# Patient Record
Sex: Female | Born: 1945 | Race: White | Hispanic: No | Smoking: Never smoker
Health system: Southern US, Community
[De-identification: ages and names within clinical notes are randomized; demographics above are authoritative.]

## PROBLEM LIST (undated history)

## (undated) DIAGNOSIS — Z8719 Personal history of other diseases of the digestive system: Secondary | ICD-10-CM

## (undated) DIAGNOSIS — E079 Disorder of thyroid, unspecified: Secondary | ICD-10-CM

## (undated) DIAGNOSIS — J189 Pneumonia, unspecified organism: Secondary | ICD-10-CM

## (undated) DIAGNOSIS — K219 Gastro-esophageal reflux disease without esophagitis: Secondary | ICD-10-CM

## (undated) DIAGNOSIS — M48061 Spinal stenosis, lumbar region without neurogenic claudication: Secondary | ICD-10-CM

## (undated) DIAGNOSIS — K315 Obstruction of duodenum: Secondary | ICD-10-CM

## (undated) DIAGNOSIS — R0601 Orthopnea: Secondary | ICD-10-CM

## (undated) DIAGNOSIS — E039 Hypothyroidism, unspecified: Secondary | ICD-10-CM

## (undated) DIAGNOSIS — M549 Dorsalgia, unspecified: Secondary | ICD-10-CM

## (undated) DIAGNOSIS — D369 Benign neoplasm, unspecified site: Secondary | ICD-10-CM

## (undated) DIAGNOSIS — F32A Depression, unspecified: Secondary | ICD-10-CM

## (undated) DIAGNOSIS — K224 Dyskinesia of esophagus: Secondary | ICD-10-CM

## (undated) DIAGNOSIS — M1990 Unspecified osteoarthritis, unspecified site: Secondary | ICD-10-CM

## (undated) DIAGNOSIS — H353290 Exudative age-related macular degeneration, unspecified eye, stage unspecified: Secondary | ICD-10-CM

## (undated) DIAGNOSIS — Z87898 Personal history of other specified conditions: Secondary | ICD-10-CM

## (undated) DIAGNOSIS — M797 Fibromyalgia: Secondary | ICD-10-CM

## (undated) DIAGNOSIS — H9190 Unspecified hearing loss, unspecified ear: Secondary | ICD-10-CM

## (undated) DIAGNOSIS — I10 Essential (primary) hypertension: Secondary | ICD-10-CM

## (undated) DIAGNOSIS — J45909 Unspecified asthma, uncomplicated: Secondary | ICD-10-CM

## (undated) DIAGNOSIS — H409 Unspecified glaucoma: Secondary | ICD-10-CM

## (undated) DIAGNOSIS — R0600 Dyspnea, unspecified: Secondary | ICD-10-CM

## (undated) DIAGNOSIS — G894 Chronic pain syndrome: Secondary | ICD-10-CM

## (undated) DIAGNOSIS — F319 Bipolar disorder, unspecified: Secondary | ICD-10-CM

## (undated) DIAGNOSIS — F329 Major depressive disorder, single episode, unspecified: Secondary | ICD-10-CM

## (undated) DIAGNOSIS — H3530 Unspecified macular degeneration: Secondary | ICD-10-CM

## (undated) HISTORY — PX: COCCYX REMOVAL: SHX600

## (undated) HISTORY — DX: Benign neoplasm, unspecified site: D36.9

## (undated) HISTORY — DX: Unspecified glaucoma: H40.9

## (undated) HISTORY — PX: HERNIA REPAIR: SHX51

## (undated) HISTORY — DX: Dyskinesia of esophagus: K22.4

## (undated) HISTORY — PX: TONSILLECTOMY: SUR1361

## (undated) HISTORY — PX: HEMORROIDECTOMY: SUR656

## (undated) HISTORY — DX: Unspecified macular degeneration: H35.30

## (undated) HISTORY — PX: FOOT SURGERY: SHX648

---

## 1995-02-03 HISTORY — PX: ERCP: SHX60

## 2000-06-16 ENCOUNTER — Encounter: Payer: Self-pay | Admitting: Occupational Therapy

## 2000-06-16 ENCOUNTER — Ambulatory Visit (HOSPITAL_COMMUNITY): Admission: RE | Admit: 2000-06-16 | Discharge: 2000-06-16 | Payer: Self-pay | Admitting: Pulmonary Disease

## 2000-08-31 ENCOUNTER — Ambulatory Visit (HOSPITAL_COMMUNITY): Admission: RE | Admit: 2000-08-31 | Discharge: 2000-08-31 | Payer: Self-pay | Admitting: Occupational Therapy

## 2000-08-31 ENCOUNTER — Encounter: Payer: Self-pay | Admitting: Occupational Therapy

## 2001-06-20 ENCOUNTER — Other Ambulatory Visit: Admission: RE | Admit: 2001-06-20 | Discharge: 2001-06-20 | Payer: Self-pay | Admitting: Family Medicine

## 2001-07-25 ENCOUNTER — Ambulatory Visit (HOSPITAL_COMMUNITY): Admission: RE | Admit: 2001-07-25 | Discharge: 2001-07-25 | Payer: Self-pay | Admitting: Family Medicine

## 2001-07-25 ENCOUNTER — Encounter: Payer: Self-pay | Admitting: Family Medicine

## 2001-12-02 ENCOUNTER — Ambulatory Visit (HOSPITAL_BASED_OUTPATIENT_CLINIC_OR_DEPARTMENT_OTHER): Admission: RE | Admit: 2001-12-02 | Discharge: 2001-12-02 | Payer: Self-pay | Admitting: Podiatry

## 2001-12-23 ENCOUNTER — Ambulatory Visit (HOSPITAL_BASED_OUTPATIENT_CLINIC_OR_DEPARTMENT_OTHER): Admission: RE | Admit: 2001-12-23 | Discharge: 2001-12-23 | Payer: Self-pay | Admitting: Podiatry

## 2002-08-23 ENCOUNTER — Encounter: Payer: Self-pay | Admitting: Emergency Medicine

## 2002-08-23 ENCOUNTER — Emergency Department (HOSPITAL_COMMUNITY): Admission: EM | Admit: 2002-08-23 | Discharge: 2002-08-23 | Payer: Self-pay | Admitting: Emergency Medicine

## 2002-11-06 ENCOUNTER — Ambulatory Visit (HOSPITAL_COMMUNITY): Admission: RE | Admit: 2002-11-06 | Discharge: 2002-11-06 | Payer: Self-pay | Admitting: Family Medicine

## 2002-11-06 ENCOUNTER — Encounter: Payer: Self-pay | Admitting: Family Medicine

## 2003-02-03 HISTORY — PX: OTHER SURGICAL HISTORY: SHX169

## 2003-06-26 ENCOUNTER — Other Ambulatory Visit: Admission: RE | Admit: 2003-06-26 | Discharge: 2003-06-26 | Payer: Self-pay | Admitting: Family Medicine

## 2003-08-21 ENCOUNTER — Ambulatory Visit (HOSPITAL_COMMUNITY): Admission: RE | Admit: 2003-08-21 | Discharge: 2003-08-21 | Payer: Self-pay | Admitting: Internal Medicine

## 2003-12-22 ENCOUNTER — Emergency Department (HOSPITAL_COMMUNITY): Admission: EM | Admit: 2003-12-22 | Discharge: 2003-12-22 | Payer: Self-pay | Admitting: Emergency Medicine

## 2003-12-28 ENCOUNTER — Emergency Department (HOSPITAL_COMMUNITY): Admission: EM | Admit: 2003-12-28 | Discharge: 2003-12-28 | Payer: Self-pay | Admitting: Emergency Medicine

## 2004-01-10 ENCOUNTER — Ambulatory Visit (HOSPITAL_COMMUNITY): Admission: RE | Admit: 2004-01-10 | Discharge: 2004-01-10 | Payer: Self-pay | Admitting: Family Medicine

## 2004-01-15 ENCOUNTER — Ambulatory Visit: Payer: Self-pay | Admitting: Internal Medicine

## 2004-02-06 ENCOUNTER — Ambulatory Visit (HOSPITAL_COMMUNITY): Admission: RE | Admit: 2004-02-06 | Discharge: 2004-02-06 | Payer: Self-pay | Admitting: Family Medicine

## 2004-05-13 ENCOUNTER — Inpatient Hospital Stay: Payer: Self-pay | Admitting: Psychiatry

## 2004-06-11 ENCOUNTER — Ambulatory Visit: Payer: Self-pay | Admitting: Internal Medicine

## 2004-12-03 ENCOUNTER — Ambulatory Visit: Payer: Self-pay | Admitting: Internal Medicine

## 2004-12-18 ENCOUNTER — Other Ambulatory Visit: Admission: RE | Admit: 2004-12-18 | Discharge: 2004-12-18 | Payer: Self-pay | Admitting: Family Medicine

## 2005-03-17 ENCOUNTER — Ambulatory Visit (HOSPITAL_COMMUNITY): Admission: RE | Admit: 2005-03-17 | Discharge: 2005-03-17 | Payer: Self-pay | Admitting: Internal Medicine

## 2006-01-13 ENCOUNTER — Ambulatory Visit: Payer: Self-pay | Admitting: Internal Medicine

## 2006-06-11 ENCOUNTER — Ambulatory Visit (HOSPITAL_COMMUNITY): Admission: RE | Admit: 2006-06-11 | Discharge: 2006-06-11 | Payer: Self-pay | Admitting: Family Medicine

## 2006-07-05 ENCOUNTER — Other Ambulatory Visit: Payer: Self-pay

## 2006-07-06 ENCOUNTER — Inpatient Hospital Stay: Payer: Self-pay | Admitting: Internal Medicine

## 2006-07-10 ENCOUNTER — Emergency Department: Payer: Self-pay | Admitting: Emergency Medicine

## 2006-07-19 ENCOUNTER — Inpatient Hospital Stay: Payer: Self-pay | Admitting: Unknown Physician Specialty

## 2006-07-21 ENCOUNTER — Other Ambulatory Visit: Payer: Self-pay

## 2007-04-11 ENCOUNTER — Other Ambulatory Visit: Admission: RE | Admit: 2007-04-11 | Discharge: 2007-04-11 | Payer: Self-pay | Admitting: Family Medicine

## 2007-07-28 ENCOUNTER — Ambulatory Visit (HOSPITAL_COMMUNITY): Admission: RE | Admit: 2007-07-28 | Discharge: 2007-07-28 | Payer: Self-pay | Admitting: Family Medicine

## 2007-08-16 ENCOUNTER — Emergency Department (HOSPITAL_COMMUNITY): Admission: EM | Admit: 2007-08-16 | Discharge: 2007-08-16 | Payer: Self-pay | Admitting: Emergency Medicine

## 2008-01-24 ENCOUNTER — Inpatient Hospital Stay: Payer: Self-pay | Admitting: Psychiatry

## 2008-01-24 ENCOUNTER — Ambulatory Visit: Payer: Self-pay | Admitting: Internal Medicine

## 2008-03-26 ENCOUNTER — Inpatient Hospital Stay: Payer: Self-pay | Admitting: Psychiatry

## 2008-03-29 ENCOUNTER — Ambulatory Visit: Payer: Self-pay | Admitting: Internal Medicine

## 2008-10-02 ENCOUNTER — Ambulatory Visit (HOSPITAL_COMMUNITY): Admission: RE | Admit: 2008-10-02 | Discharge: 2008-10-02 | Payer: Self-pay | Admitting: Family Medicine

## 2009-01-07 ENCOUNTER — Inpatient Hospital Stay: Payer: Self-pay | Admitting: Internal Medicine

## 2009-12-18 ENCOUNTER — Ambulatory Visit (HOSPITAL_COMMUNITY): Admission: RE | Admit: 2009-12-18 | Discharge: 2009-12-18 | Payer: Self-pay | Admitting: Family Medicine

## 2010-01-13 ENCOUNTER — Ambulatory Visit (HOSPITAL_COMMUNITY)
Admission: RE | Admit: 2010-01-13 | Discharge: 2010-01-13 | Payer: Self-pay | Source: Home / Self Care | Attending: Family Medicine | Admitting: Family Medicine

## 2010-02-24 ENCOUNTER — Encounter: Payer: Self-pay | Admitting: Family Medicine

## 2010-06-20 NOTE — Op Note (Signed)
   NAME:  Heidi Fuentes, Heidi Fuentes                        ACCOUNT NO.:  [id number]   MEDICAL RECORD NO.:  [id number]                   PATIENT TYPE:  AMB   LOCATION:  DSC                                  FACILITY:  [facility]   PHYSICIAN:  Ezequiel Kayser. Ajlouny, D.P.M.           DATE OF BIRTH:  1945/08/02   DATE OF PROCEDURE:  12/02/2001  DATE OF DISCHARGE:  12/02/2001                                 OPERATIVE REPORT   ADDENDUM:  Periosteum were closed with 3-0 Vicryl, subcu closed with 4-0  Vicryl and skin closure with 4-0 Vicryl mad in horizontal mattress fashion.  Postoperatively, 5 cc of 0.5% Marcaine plain and 1 cc of dexamethasone  phosphate were infiltrated round the surgical site.  The foot was dressed  with Xeroform, 4 x 4's, Kling, and Coban.  The tourniquet was deflated and  vascular status returned to all digits.  The patient was sent to the  recovery room with vital signs stable and capillary refill at good levels.  Both written and oral postoperative instructions were given to the patient.  Postoperative shoe was dispensed.  The patient was instructed to contact  office immediately if there are any problems.   DESCRIPTION OF PROCEDURE:  The blank should read ankle.   Second paragraph, where it says the capital fragment was transposed  laterally and plantar flexed the desired amount and impacted onto the shaft.  This was temporarily stabilized with a 0.062 K-wire.  Next, two points of  fixation were used, using a 1.5 mm Bionx absorbable pin in the usual AO  fashion.  The K-wire fixation was removed and the osteotomy site was found  to be stable and in good alignment.  (Delete in very good position.)  The  remaining medial eminence was resected using a saggital saw and all rough  edges were smoothed with a rotary bur.                                               Ezequiel Kayser. Harriet Pho, D.P.M.    MJA/MEDQ  D:  12/23/2001  T:  12/24/2001  Job:  [id number]

## 2010-06-20 NOTE — Op Note (Signed)
NAME:  Heidi Fuentes, Heidi Fuentes                        ACCOUNT NO.:  [id number]   MEDICAL RECORD NO.:  [id number]                   PATIENT TYPE:  AMB   LOCATION:  DAY                                  FACILITY:  [facility]   PHYSICIAN:  Lionel December, M.D.                 DATE OF BIRTH:  1945/08/05   DATE OF PROCEDURE:  08/21/2003  DATE OF DISCHARGE:                                 OPERATIVE REPORT   PROCEDURE:  Total colonoscopy.   INDICATIONS FOR PROCEDURE:  Ms. Heidi Fuentes is a 65 year old Caucasian female  with multiple medical problems who presents with intractable constipation.  She has had constipation for several years, but lately it has gotten worse  and nothing works other than OTC laxatives.  She had a flexible  sigmoidoscopy 25 years ago.  She is undergoing colonoscopy both for  diagnostic/screening purposes.  She has intermittent hematochezia felt to be  secondary to hemorrhoids.  The procedure and risks were reviewed with the  patient, and informed consent was obtained.   PREOPERATIVE MEDICATIONS:  Demerol 50 mg IV, Versed 12 mg IV in divided  dose.   FINDINGS:  The procedure was performed in the endoscopy suite.  The  patient's vital signs and O2 saturations were monitored during the procedure  and remained stable.  The patient was placed in the left lateral recumbent  position and rectal examination performed.  No abnormality noted on external  or digital exam.  Rectal tone was felt to be normal.  The Olympus videoscope  was placed into the rectum and advanced into the region of the sigmoid  colon.  There was mild pigmentation of the mucosa of the sigmoid colon  consistent with melanosis coli.  Tortuous sigmoid colon, but slowly the  scope was advanced into the splenic flexure and beyond.  The scope was  passed to the cecum which was identified by the appendiceal orifice and  ileocecal valve.  Pictures were taken for the record.  As the scope was  withdrawn, the colonic mucosa was  carefully examined and was otherwise  normal throughout.  The rectal mucosa similarly was normal.  The scope was  retroflexed to examine the anorectal junction, and there was a small scar  just above the dentate line felt to be from previous hemorrhoidectomy.  The  endoscope was straightened and withdrawn.  The patient tolerated the  procedure well.   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  Otherwise normal colonoscopy.   RECOMMENDATIONS:  1. She will continue high fiber diet and Fiber Choice as before.  She will     also stay on Colace two tablets at bedtime.  2. I would like for her to go back on MiraLax one to one and a half scoops     daily.  3. The patient is advised to try to have a bowel movement 15 to 30 minutes  after breakfast daily even though she does not have an urge.  She could     use a glycerine or Dulcolax suppository to facilitate this.  If she has     no bowel movement, she is allowed to use a Fleet enema every third day.     The patient is advised not to take any OTC     laxatives.  4. She will keep a stool diary and return for office visit in eight weeks.     Unless there is significant improvement, would try her on Zelnorm prior     to considering colonic motility study.      ___________________________________________                                            Lionel December, M.D.   NR/MEDQ  D:  08/21/2003  T:  08/21/2003  Job:  [id number]   cc:   Molly Maduro L. Foy Guadalajara, M.D.  [address] [place]  [place] [address]  Fax: [phone]
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
## 2010-06-20 NOTE — Op Note (Signed)
NAME:  Heidi Fuentes, Heidi Fuentes                        ACCOUNT NO.:  [id number]   MEDICAL RECORD NO.:  [id number]                   PATIENT TYPE:  AMB   LOCATION:  DSC                                  FACILITY:  [facility]   PHYSICIAN:  Ezequiel Kayser. Ajlouny, D.P.M.           DATE OF BIRTH:  16-Mar-1945   DATE OF PROCEDURE:  DATE OF DISCHARGE:                                 OPERATIVE REPORT   PREOPERATIVE DIAGNOSIS:  Tailor's bunion, left foot.   POSTOPERATIVE DIAGNOSIS:  Tailor's bunion, left foot.   PROCEDURE:  Tailor's bunionectomy with fifth metatarsal osteotomy and  internal fixation, left foot.   ASSISTANT:  None.   HEMOSTASIS:  Pneumatic ankle tourniquet inflated to 250 mmHg.   ANESTHESIA:  Monitored local.   DESCRIPTION OF PROCEDURE:  The patient was brought to the operating room and  placed in the supine position at which time monitored anesthesia care was  administered.  A local block was performed with a 1:1 mixture of 0.5%  Marcaine plain and 2% lidocaine plain.  A well padded pneumatic ankle  tourniquet was applied superior to the medial malleolus.  The patient was  prepped and draped in the usual aseptic manner.  The foot was exsanguinated  and a Esmarch bandage and the previously placed tourniquet inflated to 250  mmHg.   Attention was directed to the fifth ray where a dorsal linear incision was  made.  The incision was deepened via blunt and sharp modalities, taking care  to cauterize all bleeding vessels and ensuring retraction of all  neurovascular structures encountered.  The deep and superficial fascia were  separated dorsally and laterally.  The incision was deepened, and an L-  shaped capsulotomy was made.  Capsule and periosteum were made reflecting  the periosteum and capsule off the fifth metatarsal head.  The lateral  eminence ws resected parallel to the shaft utilizing the sagittal saw.  Next, a V-oriented osteotomy was made transposing the capital fragment  medially, the desired amount impacted onto the shaft.  This was made to  attempt to lengthen the metatarsal as well.  This was temporarily stabilized  with a 0.062 K-wire.  Using one point of fixation, a 1.5 Bionx Smart Pin  absorbable pin was used to stabilize the osteotomy site.  The K-wire was  then removed.  This was found to be stable and in good alignment.  The  remaining lateral eminence was resected and all rough edges were smoothed.  A lateral capsulorrhaphy was performed.  Capsule and periosteum were closed  with 3-0 Vicryl, subcutaneous closure with 4-0 Vicryl and skin closure with  4-0 nylon in a horizontal mattress fashion.  Postoperatively, 2 cc of 0.5  Marcaine plain and 1 cc of dexamethasone were infiltrated along the surgical  site.  The foot was dressed with Xeroform, 4 x 4, Kling and Coban.  The  tourniquet was deflated and vascular status returned to all digits.  The  patient was sent to the recovery room with vital signs stable and capillary  refill time at pre-surgical levels.  Both written and oral postoperative  instructions were give to the patient.  Postoperative shoe was dispensed.  The patient was instructed to contact the office immediately if there are  any problems.                                               Ezequiel Kayser. Harriet Pho, D.P.M.   MJA/MEDQ  D:  12/23/2001  T:  12/24/2001  Job:  [id number]

## 2010-06-20 NOTE — Op Note (Signed)
NAME:  Heidi Fuentes, Heidi Fuentes                        ACCOUNT NO.:  [id number]   MEDICAL RECORD NO.:  [id number]                   PATIENT TYPE:  AMB   LOCATION:  DSC                                  FACILITY:  [facility]   PHYSICIAN:  Ezequiel Kayser. Ajlouny, D.P.M.           DATE OF BIRTH:  Nov 08, 1945   DATE OF PROCEDURE:  12/02/2001  DATE OF DISCHARGE:  12/02/2001                                 OPERATIVE REPORT   PREOPERATIVE DIAGNOSES:  1. Hallux abductovalgus deformity, right foot.  2. Ladona Ridgel bunion deformity, right foot.   POSTOPERATIVE DIAGNOSES:  1. Hallux abductovalgus deformity, right foot.  2. Ladona Ridgel bunion deformity, right foot.   PROCEDURES:  1. Bunionectomy with first metatarsal osteotomy and internal fixation, right     foot.  2. Ladona Ridgel bunionectomy with fifth metatarsal osteotomy and internal     fixation, right foot.   SURGEON:  Ezequiel Kayser. Ajlouny, D.P.M.   HEMOSTASIS:  Pneumatic ankle tourniquet inflated to 250 mmHg.   ANESTHESIA:  MAC with local.   DESCRIPTION OF PROCEDURE:  The patient was brought to the OR and placed in  the supine position, at which time monitored anesthesia care was  administered.  A local block was performed with a 1:1 mixture of 0.5%  Marcaine plain and 1% lidocaine plain.  A well-padded pneumatic _________  tourniquet was applied superior to the medial malleolus.  The patient was  prepped and draped in the usual aseptic manner.  The foot was exsanguinated  with an Esmarch bandage and the previously-applied tourniquet applied to 250  mmHg.   Attention was directed to the first ray, where a dorsal linear incision was  made.  The incision was deepened using sharp and blunt modalities, taking  care to clamp and cauterize all bleeding vessels, and ensuring retraction of  all neurovascular structures encountered.  The deep and superficial fascia  were separated medially and dorsally the length of the incision.  The  incision was deepened and attention  drawn to the first interspace to the  level of the deep transverse intermetatarsal ligament, which was released at  this time.  Next a lateral capsulotomy was made and the adductor tendon  released from the base of the proximal phalanx and the fibular sesamoid  ligament released, allowing for medial transposition of the sesamoid.  Attention was redirected to the head of the first metatarsal, where an  inverted L capsulotomy was made and the capsule and periosteum freed from  the head of the first metatarsal.  Next the medial eminence was resected  utilizing sagittal saw parallel with the shaft.  The head of the first  metatarsal and the base of the proximal phalanx cartilage was evaluated and  found to be within normal limits.  Next the head of the first metatarsal, a  V-oriented osteotomy was made, taking care to ensure that the plantar cut  did not disrupt the plantar sesamoid apparatus.  The capital  fragment was  transposed laterally and plantar flexed the desired amount and impacted on  the shaft.  We temporarily stabilized with a 0.062 K-wire.  Next two points  of fixation were used, using a 1.5 mm Bion-X absorbable pin in the usual AO  fashion.  The temporary K-wire fixation was removed and the ostomy site was  found to be stable in alignment, in very good position.  The remaining  medial eminence was resected utilizing sagittal saw, and all the heads were  smoothed with a rotary bur.  The surgical wound was irrigated with copious  amounts of sterile saline and antibiotic solution.  A medial capsulorrhaphy  was performed.  Deep closure was accomplished using 2-0 and 3-0 Vicryl,  closing the periosteum and capsule.  Subcutaneous closure was accomplished  using 4-0 Vicryl and skin closure was accomplished using 4-0 nylon in a  horizontal mattress fashion.  Next attention was directed to the fifth ray,  where a dorsal linear incision was made.  The incision was deepened via  sharp and  blunt modalities, taking care to clamp and cauterize all bleeding  vessels and ensuring retraction of all neurovascular structures encountered.  The deep and superficial fascia were separated dorsally and laterally.  Incision was deepened and a linear incision through the capsule and  periosteum was made reflecting the periosteum and capsule off the fifth  metatarsal head.  The lateral eminence was resected parallel to the shaft  utilizing sagittal saw, and a V-oriented osteotomy was made, transposing the  capital fragment medially the desired amount and impacted on the shaft.  This was temporarily stabilized with a 0.062 K-wire.  Using one point of  fixation, a 1.5 Bion-X absorbable pin was used to stabilize the osteotomy  site and the K-wire was then removed.  This was found to be stable and in  good alignment.  The remaining lateral eminence was resected and all rough  edges were smoothed.  The capsule and periosteum were closed with 3-0  Vicryl, subcutaneous closure with 4-0 Vicryl, and skin closure with 4-0  nylon in a horizontal mattress fashion.  Postoperatively 5 cc of Sensorcaine  0.5% plain and 1 cc of dexamethasone phosphate were infiltrated between both  surgical sites.  The foot was dressed with Steri-Strips, Xeroform, 4 x 4's,  Kling, and Coban.  The tourniquet deflated and vascular status returned to  all digits.  The patient was sent to the recovery room with vital signs  stable and capillary refill returned to presurgical levels.  Both oral and  written postoperative instructions were given the patient.  A postoperative  shoe was dispensed.  All questions answered.  No guarantees were given.                                               Ezequiel Kayser. Harriet Pho, D.P.M.    MJA/MEDQ  D:  12/04/2001  T:  12/05/2001  Job:  [id number]

## 2010-08-26 ENCOUNTER — Emergency Department (HOSPITAL_COMMUNITY)
Admission: EM | Admit: 2010-08-26 | Discharge: 2010-08-26 | Disposition: A | Discharge status: Home / Self Care | Payer: MEDICARE | Attending: Emergency Medicine | Admitting: Emergency Medicine

## 2010-08-26 ENCOUNTER — Emergency Department (HOSPITAL_COMMUNITY): Payer: MEDICARE

## 2010-08-26 DIAGNOSIS — X19XXXA Contact with other heat and hot substances, initial encounter: Secondary | ICD-10-CM | POA: Insufficient documentation

## 2010-08-26 DIAGNOSIS — S239XXA Sprain of unspecified parts of thorax, initial encounter: Secondary | ICD-10-CM | POA: Insufficient documentation

## 2010-08-26 DIAGNOSIS — X58XXXA Exposure to other specified factors, initial encounter: Secondary | ICD-10-CM | POA: Insufficient documentation

## 2010-08-26 DIAGNOSIS — IMO0001 Reserved for inherently not codable concepts without codable children: Secondary | ICD-10-CM | POA: Insufficient documentation

## 2010-08-26 DIAGNOSIS — T2114XA Burn of first degree of lower back, initial encounter: Secondary | ICD-10-CM | POA: Insufficient documentation

## 2010-08-26 DIAGNOSIS — E079 Disorder of thyroid, unspecified: Secondary | ICD-10-CM | POA: Insufficient documentation

## 2010-08-26 DIAGNOSIS — IMO0002 Reserved for concepts with insufficient information to code with codable children: Secondary | ICD-10-CM

## 2010-08-26 DIAGNOSIS — S335XXA Sprain of ligaments of lumbar spine, initial encounter: Secondary | ICD-10-CM | POA: Insufficient documentation

## 2010-08-26 DIAGNOSIS — Y92009 Unspecified place in unspecified non-institutional (private) residence as the place of occurrence of the external cause: Secondary | ICD-10-CM | POA: Insufficient documentation

## 2010-08-26 HISTORY — DX: Fibromyalgia: M79.7

## 2010-08-26 HISTORY — DX: Disorder of thyroid, unspecified: E07.9

## 2010-08-26 HISTORY — DX: Depression, unspecified: F32.A

## 2010-08-26 HISTORY — DX: Major depressive disorder, single episode, unspecified: F32.9

## 2010-08-26 MED ORDER — LORAZEPAM 1 MG PO TABS
1.0000 mg | ORAL_TABLET | Freq: Once | ORAL | Status: AC
  Administered 2010-08-26: 1 mg via ORAL
  Filled 2010-08-26: qty 1

## 2010-08-26 MED ORDER — MORPHINE SULFATE 10 MG/ML IJ SOLN
4.0000 mg | Freq: Once | INTRAMUSCULAR | Status: AC
  Administered 2010-08-26: 4 mg via INTRAMUSCULAR
  Filled 2010-08-26: qty 1

## 2010-08-26 MED ORDER — KETOROLAC TROMETHAMINE 60 MG/2ML IM SOLN
60.0000 mg | Freq: Once | INTRAMUSCULAR | Status: AC
  Administered 2010-08-26: 60 mg via INTRAMUSCULAR
  Filled 2010-08-26: qty 2

## 2010-08-26 MED ORDER — OXYCODONE-ACETAMINOPHEN 5-325 MG PO TABS: 1.0000 | ORAL_TABLET | ORAL | Status: AC | PRN

## 2010-08-26 MED ORDER — METHOCARBAMOL 500 MG PO TABS: 500.0000 mg | ORAL_TABLET | Freq: Three times a day (TID) | ORAL | Status: AC

## 2010-08-26 MED ORDER — ONDANSETRON 8 MG PO TBDP
8.0000 mg | ORAL_TABLET | Freq: Once | ORAL | Status: AC
  Administered 2010-08-26: 8 mg via ORAL
  Filled 2010-08-26 (×2): qty 1
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
## 2010-08-26 NOTE — ED Notes (Signed)
Pt states morphine did not help much. Ice pack given.

## 2010-08-26 NOTE — ED Notes (Signed)
Pt reports spasms in lower back.  Says worked in her garden Sat and pain started Sun evening.  Has been taking iburofen and tylenol.

## 2010-08-26 NOTE — ED Provider Notes (Signed)
History     Chief Complaint  Patient presents with  . Back Pain   HPI Comments: patient c/o persistent pain and spasms to her lower nad mid back for several days.  States the pain began after she was working in her garden chopping weeds.  States the pain has progressed and feels "tight" in her back and worse with movements and twisting.  States she had been using a heating pad and also c/o burn to the skin of her lower back.  She denies incontinence of urine or feces, numbness, weakness, chest pain of shortness ofbreath  Patient is a 65 y.o. female presenting with back pain. The history is provided by the patient.  Back Pain  This is a new problem. The current episode started more than 2 days ago. The problem occurs constantly. The problem has not changed since onset.The pain is associated with twisting (bending over). The pain is present in the lumbar spine and thoracic spine. The quality of the pain is described as aching and stabbing. The pain does not radiate. The pain is moderate. The symptoms are aggravated by bending, twisting and certain positions. The pain is the same all the time. Associated symptoms include paresis. Pertinent negatives include no chest pain, no fever, no numbness, no headaches, no abdominal pain, no bowel incontinence, no perianal numbness, no bladder incontinence, no dysuria, no pelvic pain, no leg pain, no paresthesias, no tingling and no weakness. She has tried heat and NSAIDs for the symptoms. The treatment provided no relief.    Past Medical History  Diagnosis Date  . Fibromyalgia   . Depression   . Thyroid disease     Past Surgical History  Procedure Date  . Tonsillectomy   . Hemorroidectomy   . Hernia repair   . Coccyx removal     History reviewed. No pertinent family history.  History  Substance Use Topics  . Smoking status: Never Smoker   . Smokeless tobacco: Not on file  . Alcohol Use: No    OB History    Grav Para Term Preterm Abortions  TAB SAB Ect Mult Living                  Review of Systems  Constitutional: Negative for fever, chills and appetite change.  HENT: Negative for neck pain and neck stiffness.   Respiratory: Negative for cough, chest tightness and shortness of breath.   Cardiovascular: Negative for chest pain and leg swelling.  Gastrointestinal: Negative for nausea, vomiting, abdominal pain, diarrhea, constipation and bowel incontinence.  Genitourinary: Negative for bladder incontinence, dysuria, hematuria, flank pain, decreased urine volume, difficulty urinating and pelvic pain.  Musculoskeletal: Positive for myalgias and back pain. Negative for gait problem.  Skin: Positive for color change.  Neurological: Negative for dizziness, tingling, weakness, numbness, headaches and paresthesias.  Hematological: Does not bruise/bleed easily.    Physical Exam  BP 153/65  Pulse 62  Temp(Src) 98.5 F (36.9 C) (Oral)  Resp 20  Ht 5' (1.524 m)  Wt 135 lb (61.236 kg)  BMI 26.37 kg/m2  SpO2 97%  Physical Exam  Nursing note and vitals reviewed. Constitutional: She is oriented to person, place, and time. She appears well-developed and well-nourished. No distress.  HENT:  Head: Normocephalic and atraumatic.  Eyes: Pupils are equal, round, and reactive to light.  Neck: Normal range of motion. Neck supple. No tracheal deviation present.  Cardiovascular: Normal rate, regular rhythm and normal heart sounds.   Pulmonary/Chest: Effort normal and breath sounds  normal. No respiratory distress. She exhibits no tenderness.  Abdominal: Soft. There is no tenderness.  Musculoskeletal: She exhibits tenderness. She exhibits no edema.       Arms: Lymphadenopathy:    She has no cervical adenopathy.  Neurological: She is alert and oriented to person, place, and time. She has normal reflexes. No cranial nerve deficit. She exhibits normal muscle tone. Coordination normal.  Skin: There is erythema.  Psychiatric: She has a normal  mood and affect.    ED Course  Procedures  MDM   1600  Patient feeling better, now rates pain at "5" down from "10",  has drank fluids and ate some of sandwich.  Pain to right lumbar paraspinal muscles and thoracic paraspinal muscles.  Pain is reproduced with movement.  No focal neuro deficits, vitals have remained stable.  No tachycardia, tachypnea or chest pain.  Ambulatory Pt agrees to f/u with her PMD or return here if sx's worsen      Corin Formisano L. Johanne Mcglade, [place] 08/31/10 2310

## 2010-08-31 NOTE — ED Provider Notes (Signed)
Medical screening examination/treatment/procedure(s) were performed by non-physician practitioner and as supervising physician I was immediately available for consultation/collaboration.   Nelia Shi, MD 08/31/10 1228

## 2010-09-03 NOTE — ED Provider Notes (Signed)
Medical screening examination/treatment/procedure(s) were performed by non-physician practitioner and as supervising physician I was immediately available for consultation/collaboration.   Nelia Shi, MD 09/03/10 [phone]

## 2010-10-30 LAB — URINALYSIS, ROUTINE W REFLEX MICROSCOPIC
Ketones, ur: NEGATIVE
Nitrite: NEGATIVE
Protein, ur: NEGATIVE
pH: 7

## 2011-05-13 DIAGNOSIS — Z111 Encounter for screening for respiratory tuberculosis: Secondary | ICD-10-CM | POA: Diagnosis not present

## 2011-08-13 ENCOUNTER — Ambulatory Visit (HOSPITAL_COMMUNITY)
Admission: RE | Admit: 2011-08-13 | Discharge: 2011-08-13 | Disposition: A | Discharge status: Home / Self Care | Payer: Medicare Other | Source: Ambulatory Visit | Attending: Nurse Practitioner | Admitting: Nurse Practitioner

## 2011-08-13 ENCOUNTER — Other Ambulatory Visit (HOSPITAL_COMMUNITY): Payer: Self-pay | Admitting: Nurse Practitioner

## 2011-08-13 DIAGNOSIS — R0989 Other specified symptoms and signs involving the circulatory and respiratory systems: Secondary | ICD-10-CM

## 2011-08-26 ENCOUNTER — Other Ambulatory Visit (HOSPITAL_COMMUNITY): Payer: Self-pay | Admitting: Nurse Practitioner

## 2011-08-26 DIAGNOSIS — Z139 Encounter for screening, unspecified: Secondary | ICD-10-CM

## 2011-08-28 ENCOUNTER — Ambulatory Visit (HOSPITAL_COMMUNITY)
Admission: RE | Admit: 2011-08-28 | Discharge: 2011-08-28 | Disposition: A | Discharge status: Home / Self Care | Payer: Medicare Other | Source: Ambulatory Visit | Attending: Nurse Practitioner | Admitting: Nurse Practitioner

## 2011-08-28 DIAGNOSIS — Z139 Encounter for screening, unspecified: Secondary | ICD-10-CM

## 2011-08-28 DIAGNOSIS — Z1231 Encounter for screening mammogram for malignant neoplasm of breast: Secondary | ICD-10-CM | POA: Insufficient documentation

## 2011-10-01 DIAGNOSIS — H4011X0 Primary open-angle glaucoma, stage unspecified: Secondary | ICD-10-CM | POA: Diagnosis not present

## 2011-11-26 ENCOUNTER — Telehealth: Payer: Self-pay

## 2011-11-26 NOTE — Telephone Encounter (Signed)
Pt said she does not know why she was referred. She was put on Prilosec and her swallowing is fine now. She had a colonoscopy 08/21/2003. Said she does not have a family history of colon cancer. She is not having any constipation, diarrhea, rectal bleed or any GI problems at this time. She will call if she does. Routing to Tana Coast, PA for recommendations.

## 2011-11-26 NOTE — Telephone Encounter (Signed)
Called and informed pt. She does not feel she needs to be seen now. I told her we will put her on recall for 08/2013 for her next colonoscopy. She said if she has any problems with swallowing, indigestion, rectal bleeding or any GI symptoms she will call as needed. She is aware that I am sending a letter to her PCP.  Routing to [facility] to nic recall for 08/2013.

## 2011-11-26 NOTE — Telephone Encounter (Signed)
Recall made for 7-15

## 2011-11-26 NOTE — Telephone Encounter (Signed)
Reviewed paperwork from PCP referral forms. Looks like intention for referral for dysphagia and EGD. Patient now denying symptoms and refused OV. Last colonoscopy in 08/2003. No h/o polyps. Per consult note back then, she had an aunt who had colon cancer at advanced age which would not put the patient in high risk category. If no GI problems, next TCS would be due in 08/2013.   If patient does not want an OV for swallowing and GERD issues, please send letter to PCP stating such. No TCS indicated right now.

## 2012-05-29 ENCOUNTER — Inpatient Hospital Stay: Payer: Self-pay | Admitting: Internal Medicine

## 2012-05-29 LAB — COMPREHENSIVE METABOLIC PANEL
Albumin: 4 g/dL (ref 3.4–5.0)
Alkaline Phosphatase: 79 U/L (ref 50–136)
BUN: 16 mg/dL (ref 7–18)
Chloride: 100 mmol/L (ref 98–107)
Co2: 31 mmol/L (ref 21–32)
Creatinine: 0.98 mg/dL (ref 0.60–1.30)
Glucose: 94 mg/dL (ref 65–99)
Potassium: 3.8 mmol/L (ref 3.5–5.1)
SGOT(AST): 67 U/L — ABNORMAL HIGH (ref 15–37)
SGPT (ALT): 46 U/L (ref 12–78)
Sodium: 137 mmol/L (ref 136–145)
Total Protein: 7.3 g/dL (ref 6.4–8.2)

## 2012-05-29 LAB — DRUG SCREEN, URINE
Cannabinoid 50 Ng, Ur ~~LOC~~: NEGATIVE (ref ?–50)
Cocaine Metabolite,Ur ~~LOC~~: NEGATIVE (ref ?–300)
MDMA (Ecstasy)Ur Screen: NEGATIVE (ref ?–500)
Phencyclidine (PCP) Ur S: NEGATIVE (ref ?–25)

## 2012-05-29 LAB — URINALYSIS, COMPLETE
Bacteria: NONE SEEN
Bilirubin,UR: NEGATIVE
Glucose,UR: NEGATIVE mg/dL (ref 0–75)
Ketone: NEGATIVE
Nitrite: NEGATIVE
Ph: 7 (ref 4.5–8.0)
Protein: NEGATIVE
Specific Gravity: 1.009 (ref 1.003–1.030)
Squamous Epithelial: <1
WBC UR: 76 /HPF (ref 0–5)

## 2012-05-29 LAB — CBC
HCT: 39.1 % (ref 35.0–47.0)
HGB: 12.9 g/dL (ref 12.0–16.0)
MCH: 30.2 pg (ref 26.0–34.0)
MCV: 91 fL (ref 80–100)
Platelet: 237 10*3/uL (ref 150–440)
RBC: 4.29 10*6/uL (ref 3.80–5.20)
RDW: 13.9 % (ref 11.5–14.5)
WBC: 6.6 10*3/uL (ref 3.6–11.0)

## 2012-05-29 LAB — CK: CK, Total: 654 U/L — ABNORMAL HIGH (ref 21–215)

## 2012-05-29 LAB — SALICYLATE LEVEL: Salicylates, Serum: <1.7 mg/dL

## 2012-05-29 LAB — ACETAMINOPHEN LEVEL: Acetaminophen: 10 ug/mL

## 2012-05-29 LAB — TROPONIN I: Troponin-I: <0.02 ng/mL

## 2012-05-29 LAB — CK TOTAL AND CKMB (NOT AT ARMC): CK-MB: 10.2 ng/mL — ABNORMAL HIGH (ref 0.5–3.6)

## 2012-05-29 LAB — LITHIUM LEVEL: Lithium: <0.2 mmol/L — ABNORMAL LOW

## 2012-05-30 LAB — CBC WITH DIFFERENTIAL/PLATELET
Basophil #: 0 10*3/uL (ref 0.0–0.1)
Eosinophil #: 0.3 10*3/uL (ref 0.0–0.7)
HCT: 39.2 % (ref 35.0–47.0)
MCV: 89 fL (ref 80–100)
Monocyte #: 0.7 x10 3/mm (ref 0.2–0.9)
Monocyte %: 11.1 %
Neutrophil %: 59.7 %
Platelet: 263 10*3/uL (ref 150–440)
RBC: 4.39 10*6/uL (ref 3.80–5.20)
RDW: 14 % (ref 11.5–14.5)
WBC: 6.6 10*3/uL (ref 3.6–11.0)

## 2012-05-30 LAB — BASIC METABOLIC PANEL
Anion Gap: 9 (ref 7–16)
Calcium, Total: 9 mg/dL (ref 8.5–10.1)
Chloride: 103 mmol/L (ref 98–107)
Co2: 27 mmol/L (ref 21–32)
Osmolality: 277 (ref 275–301)
Sodium: 139 mmol/L (ref 136–145)

## 2012-05-31 ENCOUNTER — Inpatient Hospital Stay: Payer: Self-pay | Admitting: Psychiatry

## 2012-06-01 LAB — BEHAVIORAL MEDICINE 1 PANEL
Albumin: 4.3 g/dL (ref 3.4–5.0)
BUN: 28 mg/dL — ABNORMAL HIGH (ref 7–18)
Basophil %: 0.6 %
Bilirubin,Total: 0.5 mg/dL (ref 0.2–1.0)
Chloride: 102 mmol/L (ref 98–107)
Co2: 24 mmol/L (ref 21–32)
Creatinine: 1.38 mg/dL — ABNORMAL HIGH (ref 0.60–1.30)
Eosinophil #: 0.2 10*3/uL (ref 0.0–0.7)
HCT: 42.4 % (ref 35.0–47.0)
HGB: 14.5 g/dL (ref 12.0–16.0)
Lymphocyte #: 3 10*3/uL (ref 1.0–3.6)
MCH: 30.5 pg (ref 26.0–34.0)
MCHC: 34.3 g/dL (ref 32.0–36.0)
Monocyte #: 0.8 x10 3/mm (ref 0.2–0.9)
Monocyte %: 8.9 %
Potassium: 3.6 mmol/L (ref 3.5–5.1)
RBC: 4.75 10*6/uL (ref 3.80–5.20)
SGPT (ALT): 49 U/L (ref 12–78)
Thyroid Stimulating Horm: 1.26 u[IU]/mL
Total Protein: 8.2 g/dL (ref 6.4–8.2)
WBC: 9.4 10*3/uL (ref 3.6–11.0)

## 2012-06-02 LAB — URINALYSIS, COMPLETE
Bilirubin,UR: NEGATIVE
Glucose,UR: NEGATIVE mg/dL (ref 0–75)
Nitrite: NEGATIVE
Ph: 5 (ref 4.5–8.0)
Protein: 30
RBC,UR: 4 /HPF (ref 0–5)
Specific Gravity: 1.017 (ref 1.003–1.030)
Squamous Epithelial: 5
WBC UR: 10 /HPF (ref 0–5)

## 2012-11-10 ENCOUNTER — Other Ambulatory Visit (HOSPITAL_COMMUNITY): Payer: Self-pay | Admitting: Family Medicine

## 2012-11-10 DIAGNOSIS — M546 Pain in thoracic spine: Secondary | ICD-10-CM

## 2012-11-11 ENCOUNTER — Other Ambulatory Visit (HOSPITAL_COMMUNITY): Payer: Self-pay | Admitting: Family Medicine

## 2012-11-11 DIAGNOSIS — Z139 Encounter for screening, unspecified: Secondary | ICD-10-CM

## 2012-11-14 ENCOUNTER — Ambulatory Visit (HOSPITAL_COMMUNITY)
Admission: RE | Admit: 2012-11-14 | Discharge: 2012-11-14 | Disposition: A | Discharge status: Home / Self Care | Payer: Medicare Other | Source: Ambulatory Visit | Attending: Family Medicine | Admitting: Family Medicine

## 2012-11-14 DIAGNOSIS — R209 Unspecified disturbances of skin sensation: Secondary | ICD-10-CM | POA: Insufficient documentation

## 2012-11-14 DIAGNOSIS — Z139 Encounter for screening, unspecified: Secondary | ICD-10-CM

## 2012-11-14 DIAGNOSIS — M5124 Other intervertebral disc displacement, thoracic region: Secondary | ICD-10-CM | POA: Insufficient documentation

## 2012-11-14 DIAGNOSIS — M546 Pain in thoracic spine: Secondary | ICD-10-CM | POA: Insufficient documentation

## 2012-11-14 DIAGNOSIS — Z1231 Encounter for screening mammogram for malignant neoplasm of breast: Secondary | ICD-10-CM | POA: Insufficient documentation

## 2012-11-28 ENCOUNTER — Other Ambulatory Visit (HOSPITAL_COMMUNITY): Payer: Self-pay | Admitting: *Deleted

## 2012-11-28 DIAGNOSIS — S22000A Wedge compression fracture of unspecified thoracic vertebra, initial encounter for closed fracture: Secondary | ICD-10-CM

## 2012-12-05 ENCOUNTER — Encounter (HOSPITAL_COMMUNITY): Payer: Medicare Other

## 2013-07-09 ENCOUNTER — Encounter (HOSPITAL_COMMUNITY): Payer: Self-pay | Admitting: Emergency Medicine

## 2013-07-09 ENCOUNTER — Emergency Department (HOSPITAL_COMMUNITY): Payer: Medicare HMO

## 2013-07-09 ENCOUNTER — Inpatient Hospital Stay (HOSPITAL_COMMUNITY)
Admission: EM | Admit: 2013-07-09 | Discharge: 2013-07-12 | DRG: 392 | Disposition: A | Discharge status: Home / Self Care | Payer: Medicare HMO | Attending: Family Medicine | Admitting: Family Medicine

## 2013-07-09 DIAGNOSIS — K529 Noninfective gastroenteritis and colitis, unspecified: Secondary | ICD-10-CM | POA: Diagnosis present

## 2013-07-09 DIAGNOSIS — E039 Hypothyroidism, unspecified: Secondary | ICD-10-CM | POA: Diagnosis present

## 2013-07-09 DIAGNOSIS — E038 Other specified hypothyroidism: Secondary | ICD-10-CM | POA: Diagnosis present

## 2013-07-09 DIAGNOSIS — F319 Bipolar disorder, unspecified: Secondary | ICD-10-CM | POA: Diagnosis present

## 2013-07-09 DIAGNOSIS — K5909 Other constipation: Principal | ICD-10-CM | POA: Diagnosis present

## 2013-07-09 DIAGNOSIS — F32A Depression, unspecified: Secondary | ICD-10-CM

## 2013-07-09 DIAGNOSIS — R1319 Other dysphagia: Secondary | ICD-10-CM

## 2013-07-09 DIAGNOSIS — G894 Chronic pain syndrome: Secondary | ICD-10-CM | POA: Diagnosis present

## 2013-07-09 DIAGNOSIS — D649 Anemia, unspecified: Secondary | ICD-10-CM | POA: Diagnosis present

## 2013-07-09 DIAGNOSIS — K559 Vascular disorder of intestine, unspecified: Secondary | ICD-10-CM | POA: Diagnosis present

## 2013-07-09 DIAGNOSIS — J45909 Unspecified asthma, uncomplicated: Secondary | ICD-10-CM | POA: Diagnosis present

## 2013-07-09 DIAGNOSIS — K5900 Constipation, unspecified: Secondary | ICD-10-CM | POA: Diagnosis present

## 2013-07-09 DIAGNOSIS — F329 Major depressive disorder, single episode, unspecified: Secondary | ICD-10-CM | POA: Diagnosis present

## 2013-07-09 DIAGNOSIS — M549 Dorsalgia, unspecified: Secondary | ICD-10-CM | POA: Diagnosis present

## 2013-07-09 DIAGNOSIS — T4275XA Adverse effect of unspecified antiepileptic and sedative-hypnotic drugs, initial encounter: Secondary | ICD-10-CM | POA: Diagnosis present

## 2013-07-09 DIAGNOSIS — Z8249 Family history of ischemic heart disease and other diseases of the circulatory system: Secondary | ICD-10-CM

## 2013-07-09 DIAGNOSIS — K5732 Diverticulitis of large intestine without perforation or abscess without bleeding: Secondary | ICD-10-CM | POA: Diagnosis present

## 2013-07-09 DIAGNOSIS — Z79899 Other long term (current) drug therapy: Secondary | ICD-10-CM

## 2013-07-09 DIAGNOSIS — IMO0001 Reserved for inherently not codable concepts without codable children: Secondary | ICD-10-CM | POA: Diagnosis present

## 2013-07-09 DIAGNOSIS — Z801 Family history of malignant neoplasm of trachea, bronchus and lung: Secondary | ICD-10-CM

## 2013-07-09 DIAGNOSIS — F3289 Other specified depressive episodes: Secondary | ICD-10-CM | POA: Diagnosis present

## 2013-07-09 HISTORY — DX: Unspecified asthma, uncomplicated: J45.909

## 2013-07-09 HISTORY — DX: Dorsalgia, unspecified: M54.9

## 2013-07-09 HISTORY — DX: Obstruction of duodenum: K31.5

## 2013-07-09 HISTORY — DX: Chronic pain syndrome: G89.4

## 2013-07-09 LAB — URINALYSIS, ROUTINE W REFLEX MICROSCOPIC
BILIRUBIN URINE: NEGATIVE
Glucose, UA: NEGATIVE mg/dL
LEUKOCYTES UA: NEGATIVE
NITRITE: NEGATIVE
PH: 7.5 (ref 5.0–8.0)
SPECIFIC GRAVITY, URINE: 1.02 (ref 1.005–1.030)
UROBILINOGEN UA: 0.2 mg/dL (ref 0.0–1.0)

## 2013-07-09 LAB — CBC WITH DIFFERENTIAL/PLATELET
Basophils Absolute: 0 10*3/uL (ref 0.0–0.1)
Basophils Relative: 0 % (ref 0–1)
EOS ABS: 0.2 10*3/uL (ref 0.0–0.7)
EOS PCT: 2 % (ref 0–5)
HEMATOCRIT: 33.7 % — AB (ref 36.0–46.0)
HEMOGLOBIN: 10.6 g/dL — AB (ref 12.0–15.0)
LYMPHS ABS: 1.8 10*3/uL (ref 0.7–4.0)
LYMPHS PCT: 13 % (ref 12–46)
MCH: 24.4 pg — AB (ref 26.0–34.0)
MCHC: 31.5 g/dL (ref 30.0–36.0)
MCV: 77.6 fL — AB (ref 78.0–100.0)
MONO ABS: 1.9 10*3/uL — AB (ref 0.1–1.0)
MONOS PCT: 13 % — AB (ref 3–12)
Neutro Abs: 10.5 10*3/uL — ABNORMAL HIGH (ref 1.7–7.7)
Neutrophils Relative %: 72 % (ref 43–77)
PLATELETS: 394 10*3/uL (ref 150–400)
RBC: 4.34 MIL/uL (ref 3.87–5.11)
RDW: 17.3 % — ABNORMAL HIGH (ref 11.5–15.5)
WBC: 14.5 10*3/uL — ABNORMAL HIGH (ref 4.0–10.5)

## 2013-07-09 LAB — COMPREHENSIVE METABOLIC PANEL
ALT: 18 U/L (ref 0–35)
AST: 27 U/L (ref 0–37)
Albumin: 3.4 g/dL — ABNORMAL LOW (ref 3.5–5.2)
Alkaline Phosphatase: 111 U/L (ref 39–117)
BUN: 14 mg/dL (ref 6–23)
CALCIUM: 8.4 mg/dL (ref 8.4–10.5)
CO2: 31 meq/L (ref 19–32)
Chloride: 87 mEq/L — ABNORMAL LOW (ref 96–112)
Creatinine, Ser: 0.82 mg/dL (ref 0.50–1.10)
GFR, EST AFRICAN AMERICAN: 83 mL/min — AB (ref >90)
GFR, EST NON AFRICAN AMERICAN: 72 mL/min — AB (ref >90)
GLUCOSE: 120 mg/dL — AB (ref 70–99)
Potassium: 4 mEq/L (ref 3.7–5.3)
Sodium: 131 mEq/L — ABNORMAL LOW (ref 137–147)
TOTAL PROTEIN: 7.1 g/dL (ref 6.0–8.3)
Total Bilirubin: 0.4 mg/dL (ref 0.3–1.2)

## 2013-07-09 LAB — URINE MICROSCOPIC-ADD ON

## 2013-07-09 MED ORDER — ALBUTEROL SULFATE (2.5 MG/3ML) 0.083% IN NEBU: 3.0000 mL | INHALATION_SOLUTION | Freq: Four times a day (QID) | RESPIRATORY_TRACT | Status: DC | PRN

## 2013-07-09 MED ORDER — FOLIC ACID 1 MG PO TABS
1.0000 mg | ORAL_TABLET | Freq: Every day | ORAL | Status: DC
  Administered 2013-07-10 – 2013-07-12 (×3): 1 mg via ORAL
  Filled 2013-07-09 (×3): qty 1

## 2013-07-09 MED ORDER — FUROSEMIDE 40 MG PO TABS
40.0000 mg | ORAL_TABLET | Freq: Every day | ORAL | Status: DC
  Administered 2013-07-10 – 2013-07-12 (×3): 40 mg via ORAL
  Filled 2013-07-09 (×3): qty 1

## 2013-07-09 MED ORDER — METOPROLOL SUCCINATE ER 50 MG PO TB24
50.0000 mg | ORAL_TABLET | Freq: Every day | ORAL | Status: DC
  Administered 2013-07-10 – 2013-07-12 (×3): 50 mg via ORAL
  Filled 2013-07-09 (×3): qty 1

## 2013-07-09 MED ORDER — LAMOTRIGINE 100 MG PO TABS
150.0000 mg | ORAL_TABLET | Freq: Two times a day (BID) | ORAL | Status: DC
  Administered 2013-07-10 – 2013-07-12 (×5): 150 mg via ORAL
  Filled 2013-07-09 (×3): qty 1.5
  Filled 2013-07-09: qty 1
  Filled 2013-07-09 (×2): qty 1.5
  Filled 2013-07-09: qty 1
  Filled 2013-07-09 (×3): qty 1.5

## 2013-07-09 MED ORDER — MONTELUKAST SODIUM 10 MG PO TABS
10.0000 mg | ORAL_TABLET | Freq: Every day | ORAL | Status: DC
  Administered 2013-07-10 – 2013-07-12 (×3): 10 mg via ORAL
  Filled 2013-07-09 (×3): qty 1

## 2013-07-09 MED ORDER — ONDANSETRON HCL 4 MG/2ML IJ SOLN
4.0000 mg | Freq: Once | INTRAMUSCULAR | Status: AC
  Administered 2013-07-09: 4 mg via INTRAVENOUS
  Filled 2013-07-09: qty 2

## 2013-07-09 MED ORDER — IOHEXOL 300 MG/ML  SOLN
100.0000 mL | Freq: Once | INTRAMUSCULAR | Status: AC | PRN
  Administered 2013-07-09: 100 mL via INTRAVENOUS

## 2013-07-09 MED ORDER — MORPHINE SULFATE 2 MG/ML IJ SOLN
2.0000 mg | INTRAMUSCULAR | Status: DC | PRN
  Administered 2013-07-10 – 2013-07-11 (×2): 2 mg via INTRAVENOUS
  Filled 2013-07-09 (×2): qty 1

## 2013-07-09 MED ORDER — LATANOPROST 0.005 % OP SOLN
1.0000 [drp] | Freq: Every day | OPHTHALMIC | Status: DC
  Administered 2013-07-10 – 2013-07-11 (×2): 1 [drp] via OPHTHALMIC
  Filled 2013-07-09: qty 2.5

## 2013-07-09 MED ORDER — FLEET ENEMA 7-19 GM/118ML RE ENEM: 1.0000 | ENEMA | Freq: Once | RECTAL | Status: AC | PRN

## 2013-07-09 MED ORDER — QUETIAPINE FUMARATE 25 MG PO TABS
50.0000 mg | ORAL_TABLET | Freq: Every day | ORAL | Status: DC
  Administered 2013-07-09 – 2013-07-11 (×3): 50 mg via ORAL
  Filled 2013-07-09 (×3): qty 2

## 2013-07-09 MED ORDER — SORBITOL 70 % SOLN
30.0000 mL | Freq: Every day | Status: DC | PRN
  Administered 2013-07-09: 30 mL via ORAL
  Filled 2013-07-09: qty 30

## 2013-07-09 MED ORDER — ONDANSETRON HCL 4 MG PO TABS
4.0000 mg | ORAL_TABLET | Freq: Four times a day (QID) | ORAL | Status: DC | PRN
  Administered 2013-07-10: 4 mg via ORAL
  Filled 2013-07-09: qty 1

## 2013-07-09 MED ORDER — SODIUM CHLORIDE 0.9 % IV BOLUS (SEPSIS)
700.0000 mL | Freq: Once | INTRAVENOUS | Status: AC
  Administered 2013-07-09: 20:00:00 via INTRAVENOUS

## 2013-07-09 MED ORDER — CIPROFLOXACIN IN D5W 400 MG/200ML IV SOLN
400.0000 mg | Freq: Once | INTRAVENOUS | Status: DC
  Administered 2013-07-09: 400 mg via INTRAVENOUS
  Filled 2013-07-09: qty 200

## 2013-07-09 MED ORDER — FLUTICASONE PROPIONATE 50 MCG/ACT NA SUSP
2.0000 | Freq: Every day | NASAL | Status: DC
  Administered 2013-07-10 – 2013-07-12 (×3): 2 via NASAL
  Filled 2013-07-09: qty 16

## 2013-07-09 MED ORDER — SODIUM CHLORIDE 0.9 % IV SOLN
INTRAVENOUS | Status: DC
Start: 2013-07-09 — End: 2013-07-12
  Administered 2013-07-09 – 2013-07-11 (×3): via INTRAVENOUS

## 2013-07-09 MED ORDER — MAGNESIUM HYDROXIDE 400 MG/5ML PO SUSP: 30.0000 mL | Freq: Every day | ORAL | Status: DC | PRN

## 2013-07-09 MED ORDER — HEPARIN SODIUM (PORCINE) 5000 UNIT/ML IJ SOLN
5000.0000 [IU] | Freq: Three times a day (TID) | INTRAMUSCULAR | Status: DC
  Administered 2013-07-09 – 2013-07-12 (×8): 5000 [IU] via SUBCUTANEOUS
  Filled 2013-07-09 (×8): qty 1

## 2013-07-09 MED ORDER — ASPIRIN EC 81 MG PO TBEC
81.0000 mg | DELAYED_RELEASE_TABLET | Freq: Every day | ORAL | Status: DC
  Administered 2013-07-10 – 2013-07-12 (×3): 81 mg via ORAL
  Filled 2013-07-09 (×3): qty 1

## 2013-07-09 MED ORDER — VITAMIN B-1 100 MG PO TABS
100.0000 mg | ORAL_TABLET | Freq: Every day | ORAL | Status: DC
  Administered 2013-07-10 – 2013-07-12 (×3): 100 mg via ORAL
  Filled 2013-07-09 (×3): qty 1

## 2013-07-09 MED ORDER — FENTANYL CITRATE 0.05 MG/ML IJ SOLN
25.0000 ug | INTRAMUSCULAR | Status: DC | PRN
  Administered 2013-07-09: 25 ug via INTRAVENOUS
  Filled 2013-07-09: qty 2

## 2013-07-09 MED ORDER — CYCLOBENZAPRINE HCL 10 MG PO TABS
10.0000 mg | ORAL_TABLET | Freq: Every day | ORAL | Status: DC
  Administered 2013-07-09 – 2013-07-11 (×3): 10 mg via ORAL
  Filled 2013-07-09 (×3): qty 1

## 2013-07-09 MED ORDER — MORPHINE SULFATE ER 30 MG PO TBCR
30.0000 mg | EXTENDED_RELEASE_TABLET | Freq: Two times a day (BID) | ORAL | Status: DC
  Administered 2013-07-09 – 2013-07-12 (×6): 30 mg via ORAL
  Filled 2013-07-09 (×6): qty 1

## 2013-07-09 MED ORDER — METRONIDAZOLE IN NACL 5-0.79 MG/ML-% IV SOLN
500.0000 mg | Freq: Once | INTRAVENOUS | Status: AC
  Administered 2013-07-09: 500 mg via INTRAVENOUS
  Filled 2013-07-09: qty 100

## 2013-07-09 MED ORDER — LEVOTHYROXINE SODIUM 50 MCG PO TABS
50.0000 ug | ORAL_TABLET | Freq: Every day | ORAL | Status: DC
  Administered 2013-07-10 – 2013-07-12 (×3): 50 ug via ORAL
  Filled 2013-07-09 (×4): qty 1

## 2013-07-09 MED ORDER — SODIUM CHLORIDE 0.9 % IV SOLN: INTRAVENOUS | Status: DC

## 2013-07-09 MED ORDER — ADULT MULTIVITAMIN W/MINERALS CH
1.0000 | ORAL_TABLET | Freq: Every day | ORAL | Status: DC
  Administered 2013-07-10 – 2013-07-12 (×3): 1 via ORAL
  Filled 2013-07-09 (×3): qty 1

## 2013-07-09 MED ORDER — IOHEXOL 300 MG/ML  SOLN
50.0000 mL | Freq: Once | INTRAMUSCULAR | Status: AC | PRN
  Administered 2013-07-09: 50 mL via ORAL

## 2013-07-09 MED ORDER — ONDANSETRON HCL 4 MG/2ML IJ SOLN: 4.0000 mg | Freq: Four times a day (QID) | INTRAMUSCULAR | Status: DC | PRN

## 2013-07-09 MED ORDER — PANTOPRAZOLE SODIUM 40 MG PO TBEC
40.0000 mg | DELAYED_RELEASE_TABLET | Freq: Every day | ORAL | Status: DC
  Administered 2013-07-10: 40 mg via ORAL
  Filled 2013-07-09: qty 1

## 2013-07-09 MED ORDER — HYDROCODONE-ACETAMINOPHEN 10-325 MG PO TABS
1.0000 | ORAL_TABLET | ORAL | Status: DC | PRN
  Administered 2013-07-10 – 2013-07-12 (×4): 1 via ORAL
  Filled 2013-07-09 (×5): qty 1

## 2013-07-09 NOTE — ED Notes (Signed)
Abdominal pain with constipation

## 2013-07-09 NOTE — ED Provider Notes (Signed)
CSN: [id number]     Arrival date & time 07/09/13  1717 History   First MD Initiated Contact with Patient 07/09/13 1836     Chief Complaint  Patient presents with  . Abdominal Pain     (Consider location/radiation/quality/duration/timing/severity/associated sxs/prior Treatment) HPI Patient reports chronic constipation.  She states about a week ago she felt like she was constipated.  She took milk of magnesia 5 days ago, which usually helps.  She states the next day she passed a very small ball.  She started having nausea and a feeling of gas and distention 4 days ago.  She took milk of magnesia again 3 days ago without relief.  She states she has had lower abdominal pain that started about 5 days ago also.  She states the pain is all the way across her lower abdomen and now is going into her rectum and her pelvic area.  She denies any vomiting or fever.  She states movement makes the pain worse.  She states she has had decreased urinary output and it smells.  She has decreased appetite because when she eats she feels full.  She states prior to this happening she was eating a lot of sunflower seeds which she's done in the past without problems.  She is not aware of having diverticulosis or having diverticulitis in the past.  She states she's never felt this way before.  PCP Dr Maceo Pro [facility] Dr Laural Golden  Past Medical History  Diagnosis Date  . Fibromyalgia   . Depression   . Thyroid disease   . Back pain   . Asthma    Past Surgical History  Procedure Laterality Date  . Tonsillectomy    . Hemorroidectomy    . Hernia repair    . Coccyx removal     No family history on file. History  Substance Use Topics  . Smoking status: Never Smoker   . Smokeless tobacco: Not on file  . Alcohol Use: No   Lives at home Lives alone  OB History   Grav Para Term Preterm Abortions TAB SAB Ect Mult Living                 Review of Systems  All other systems reviewed and are negative.     Allergies    Morphine and related and Sulfa antibiotics  Home Medications   Prior to Admission medications   Medication Sig Start Date End Date Taking? Authorizing Provider  acetaminophen (TYLENOL) 500 MG tablet Take 500 mg by mouth every 6 (six) hours as needed. For pain    Yes Historical Provider, MD  albuterol (VENTOLIN HFA) 108 (90 BASE) MCG/ACT inhaler Inhale 2 puffs into the lungs every 6 (six) hours as needed. For shortness of breath    Yes Historical Provider, MD  Camphor-Menthol-Methyl Sal (SALONPAS) 1.2-5.7-6.3 % PTCH Apply 1 patch topically every 8 (eight) hours as needed. For pain    Yes Historical Provider, MD  cyclobenzaprine (FLEXERIL) 10 MG tablet Take 10 mg by mouth at bedtime.     Yes Historical Provider, MD  fluticasone (FLONASE) 50 MCG/ACT nasal spray Place 2 sprays into the nose daily.     Yes Historical Provider, MD  furosemide (LASIX) 40 MG tablet Take 40 mg by mouth daily. 04/18/13  Yes Historical Provider, MD  HYDROcodone-acetaminophen (NORCO) 10-325 MG per tablet Take 1 tablet by mouth every 4 (four) hours as needed. pain 06/15/13  Yes Historical Provider, MD  lamoTRIgine (LAMICTAL) 100 MG tablet Take 150 mg  by mouth 2 (two) times daily.    Yes Historical Provider, MD  latanoprost (XALATAN) 0.005 % ophthalmic solution Place 1 drop into both eyes at bedtime. 04/20/13  Yes Historical Provider, MD  levothyroxine (SYNTHROID, LEVOTHROID) 50 MCG tablet Take 50 mcg by mouth daily.     Yes Historical Provider, MD  metoprolol succinate (TOPROL-XL) 50 MG 24 hr tablet Take 50 mg by mouth daily. 04/18/13  Yes Historical Provider, MD  montelukast (SINGULAIR) 10 MG tablet Take 10 mg by mouth daily. 05/03/13  Yes Historical Provider, MD  morphine (MS CONTIN) 30 MG 12 hr tablet Take 30 mg by mouth every 12 (twelve) hours. 06/15/13  Yes Historical Provider, MD  Multiple Vitamins-Minerals (PRESERVISION/LUTEIN) CAPS Take 1 capsule by mouth daily.     Yes Historical Provider, MD  omeprazole (PRILOSEC) 20 MG  capsule Take 20 mg by mouth daily. 04/18/13  Yes Historical Provider, MD  QUEtiapine (SEROQUEL) 50 MG tablet Take 50 mg by mouth at bedtime. 04/18/13  Yes Historical Provider, MD   BP 135/56  Pulse 85  Temp(Src) 98.1 F (36.7 C) (Oral)  Resp 22  Ht 5' (1.524 m)  Wt 149 lb 9 oz (67.841 kg)  BMI 29.21 kg/m2  SpO2 95%  Vital signs normal   Physical Exam  Nursing note and vitals reviewed. Constitutional: She is oriented to person, place, and time. She appears well-developed and well-nourished.  Non-toxic appearance. She does not appear ill. No distress.  HENT:  Head: Normocephalic and atraumatic.  Right Ear: External ear normal.  Left Ear: External ear normal.  Nose: Nose normal. No mucosal edema or rhinorrhea.  Mouth/Throat: Oropharynx is clear and moist and mucous membranes are normal. No dental abscesses or uvula swelling.  Dry tongue  Eyes: Conjunctivae and EOM are normal. Pupils are equal, round, and reactive to light.  Neck: Normal range of motion and full passive range of motion without pain. Neck supple.  Cardiovascular: Normal rate, regular rhythm and normal heart sounds.  Exam reveals no gallop and no friction rub.   No murmur heard. Pulmonary/Chest: Effort normal and breath sounds normal. No respiratory distress. She has no wheezes. She has no rhonchi. She has no rales. She exhibits no tenderness and no crepitus.  Abdominal: Soft. Normal appearance and bowel sounds are normal. She exhibits no distension. There is no tenderness. There is no rebound and no guarding.    Area of pain noted, but is only tender in the LLQ  Musculoskeletal: Normal range of motion. She exhibits no edema and no tenderness.  Moves all extremities well.   Neurological: She is alert and oriented to person, place, and time. She has normal strength. No cranial nerve deficit.  Skin: Skin is warm, dry and intact. No rash noted. No erythema. No pallor.  Psychiatric: She has a normal mood and affect. Her  speech is normal and behavior is normal. Her mood appears not anxious.    ED Course  Procedures (including critical care time)  Medications  0.9 %  sodium chloride infusion (not administered)  fentaNYL (SUBLIMAZE) injection 25 mcg (not administered)  ondansetron (ZOFRAN) injection 4 mg (not administered)  metroNIDAZOLE (FLAGYL) IVPB 500 mg (not administered)  ciprofloxacin (CIPRO) IVPB 400 mg (not administered)  sodium chloride 0.9 % bolus 700 mL ( Intravenous New Bag/Given 07/09/13 2029)  iohexol (OMNIPAQUE) 300 MG/ML solution 50 mL (50 mLs Oral Contrast Given 07/09/13 1955)  iohexol (OMNIPAQUE) 300 MG/ML solution 100 mL (100 mLs Intravenous Contrast Given 07/09/13 2058)   Pt  given her CT results. States she is still having pain. Pain meds and antibiotics ordered. Discussed need for admission.   21:40 Dr Humphrey Rolls will see patient and do orders.   Labs Review Results for orders placed during the hospital encounter of 07/09/13  CBC WITH DIFFERENTIAL      Result Value Ref Range   WBC 14.5 (*) 4.0 - 10.5 K/uL   RBC 4.34  3.87 - 5.11 MIL/uL   Hemoglobin 10.6 (*) 12.0 - 15.0 g/dL   HCT 33.7 (*) 36.0 - 46.0 %   MCV 77.6 (*) 78.0 - 100.0 fL   MCH 24.4 (*) 26.0 - 34.0 pg   MCHC 31.5  30.0 - 36.0 g/dL   RDW 17.3 (*) 11.5 - 15.5 %   Platelets 394  150 - 400 K/uL   Neutrophils Relative % 72  43 - 77 %   Neutro Abs 10.5 (*) 1.7 - 7.7 K/uL   Lymphocytes Relative 13  12 - 46 %   Lymphs Abs 1.8  0.7 - 4.0 K/uL   Monocytes Relative 13 (*) 3 - 12 %   Monocytes Absolute 1.9 (*) 0.1 - 1.0 K/uL   Eosinophils Relative 2  0 - 5 %   Eosinophils Absolute 0.2  0.0 - 0.7 K/uL   Basophils Relative 0  0 - 1 %   Basophils Absolute 0.0  0.0 - 0.1 K/uL  COMPREHENSIVE METABOLIC PANEL      Result Value Ref Range   Sodium 131 (*) 137 - 147 mEq/L   Potassium 4.0  3.7 - 5.3 mEq/L   Chloride 87 (*) 96 - 112 mEq/L   CO2 31  19 - 32 mEq/L   Glucose, Bld 120 (*) 70 - 99 mg/dL   BUN 14  6 - 23 mg/dL   Creatinine,  Ser 0.82  0.50 - 1.10 mg/dL   Calcium 8.4  8.4 - 10.5 mg/dL   Total Protein 7.1  6.0 - 8.3 g/dL   Albumin 3.4 (*) 3.5 - 5.2 g/dL   AST 27  0 - 37 U/L   ALT 18  0 - 35 U/L   Alkaline Phosphatase 111  39 - 117 U/L   Total Bilirubin 0.4  0.3 - 1.2 mg/dL   GFR calc non Af Amer 72 (*) >90 mL/min   GFR calc Af Amer 83 (*) >90 mL/min  URINALYSIS, ROUTINE W REFLEX MICROSCOPIC      Result Value Ref Range   Color, Urine YELLOW  YELLOW   APPearance HAZY (*) CLEAR   Specific Gravity, Urine 1.020  1.005 - 1.030   pH 7.5  5.0 - 8.0   Glucose, UA NEGATIVE  NEGATIVE mg/dL   Hgb urine dipstick MODERATE (*) NEGATIVE   Bilirubin Urine NEGATIVE  NEGATIVE   Ketones, ur TRACE (*) NEGATIVE mg/dL   Protein, ur TRACE (*) NEGATIVE mg/dL   Urobilinogen, UA 0.2  0.0 - 1.0 mg/dL   Nitrite NEGATIVE  NEGATIVE   Leukocytes, UA NEGATIVE  NEGATIVE  URINE MICROSCOPIC-ADD ON      Result Value Ref Range   Squamous Epithelial / LPF FEW (*) RARE   WBC, UA 0-2  <3 WBC/hpf   RBC / HPF 11-20  <3 RBC/hpf   Bacteria, UA FEW (*) RARE   Laboratory interpretation all normal except hematuria, leukocytosis, anemia, hyponatremia, low chloride both c/w dehydration .    Imaging Review Ct Abdomen Pelvis W Contrast  07/09/2013   CLINICAL DATA:  Constipation, abdominal pain, and nausea for 1  day. Left lower quadrant pain. Ate a lot of sunflower seeds.  EXAM: CT ABDOMEN AND PELVIS WITH CONTRAST  TECHNIQUE: Multidetector CT imaging of the abdomen and pelvis was performed using the standard protocol following bolus administration of intravenous contrast.  CONTRAST:  67mL OMNIPAQUE IOHEXOL 300 MG/ML SOLN, 125mL OMNIPAQUE IOHEXOL 300 MG/ML SOLN  COMPARISON:  None.  FINDINGS: Mild atelectasis in the right lung base. Large esophageal hiatal hernia.  The gallbladder is moderately distended without wall thickening. No stones or ductal dilatation are demonstrated. There is pneumobilia present. In the absence of prior gallbladder surgery or  sphincterotomy, this could indicate biliary infection or fistula. Diffuse fatty infiltration of the pancreas. No focal liver lesions. The spleen, adrenal glands, kidneys, inferior vena cava, and retroperitoneal lymph nodes are unremarkable. Calcification of aorta without aneurysm. Stomach and small bowel are not distended and no wall thickening is appreciated. No free air or free fluid in the abdomen. Abdominal wall musculature appears intact.  Pelvis: The colon is diffusely filled with stool throughout. There is mild wall thickening in the sigmoid and descending region which may indicate colitis. No specific focal evidence of diverticulitis. Changes are most consistent with colitis. No pneumatosis. No portal venous gas. Uterus and ovaries are not enlarged. Bladder wall is not thickened. No free or loculated pelvic fluid collections. No pelvic mass or lymphadenopathy. No destructive bone lesions.  IMPRESSION: Diffusely stool-filled and mildly prominent colon with wall thickening in the descending and sigmoid region suggesting colitis and constipation. Distended gallbladder without specific inflammatory signs. Pneumobilia in the intrahepatic bile ducts. In the absence of surgery or sphincterotomy, this could indicate infection or occult fistula.   Electronically Signed   By: Lucienne Capers M.D.   On: 07/09/2013 21:24     EKG Interpretation None      MDM   Final diagnoses:  Colitis  Constipation    Plan admission   Rolland Porter, MD, Alanson Aly, MD 07/09/13 2142

## 2013-07-09 NOTE — H&P (Signed)
[facility] History and Physical  GLENDOLA FRIEDHOFF ZOX:[id number] DOB: [birth year] DOA: 07/09/2013  Referring physician: Rolland Porter, MD PCP: Abigail Miyamoto, MD   Chief Complaint: Diverticulitis  HPI: Heidi Fuentes is a 68 y.o. female with a history of chronic constipation states that her last BM was about a week ago. She states that she has been having increased difficulty going to the bathroom. Patient states that she developed some nausea and states that she also noted increased abdominal pain and distension. She states that she has tried MOM with no relief. Patient had no blood in her stools. Patient states that she has had no vomiting. She states there has been no fevers noted. She denies having any headaches etc. She states that she has seen GI in the past but no recent follow up. She had a CT scan done and this revealed stool filled colon and possibly colitis.   Review of Systems:  Constitutional:  No weight loss, night sweats, Fevers, chills, fatigue.  HEENT:  No headaches, Difficulty swallowing Cardio-vascular:  No chest pain, Orthopnea, PND GI:  ++abdominal pain ++chronic constipation Resp:  No shortness of breath with exertion or at rest. No excess mucus, no productive cough, No non-productive cough Skin:  no rash or lesions.  GU:  no dysuria, change in color of urine, no urgency or frequency. No flank pain.  Musculoskeletal:  No joint pain or swelling. No decreased range of motion. No back pain.  Psych:  No change in mood or affect. No depression or anxiety. No memory loss.   Past Medical History  Diagnosis Date  . Fibromyalgia   . Depression   . Thyroid disease   . Back pain   . Asthma    Past Surgical History  Procedure Laterality Date  . Tonsillectomy    . Hemorroidectomy    . Hernia repair    . Coccyx removal     Social History:  reports that she has never smoked. She does not have any smokeless tobacco history on file. She reports that she does not  drink alcohol or use illicit drugs.  Allergies  Allergen Reactions  . Morphine And Related Nausea Only  . Sulfa Antibiotics Rash    No family history on file.   Prior to Admission medications   Medication Sig Start Date End Date Taking? Authorizing Provider  acetaminophen (TYLENOL) 500 MG tablet Take 500 mg by mouth every 6 (six) hours as needed. For pain    Yes Historical Provider, MD  albuterol (VENTOLIN HFA) 108 (90 BASE) MCG/ACT inhaler Inhale 2 puffs into the lungs every 6 (six) hours as needed. For shortness of breath    Yes Historical Provider, MD  Camphor-Menthol-Methyl Sal (SALONPAS) 1.2-5.7-6.3 % PTCH Apply 1 patch topically every 8 (eight) hours as needed. For pain    Yes Historical Provider, MD  cyclobenzaprine (FLEXERIL) 10 MG tablet Take 10 mg by mouth at bedtime.     Yes Historical Provider, MD  fluticasone (FLONASE) 50 MCG/ACT nasal spray Place 2 sprays into the nose daily.     Yes Historical Provider, MD  furosemide (LASIX) 40 MG tablet Take 40 mg by mouth daily. 04/18/13  Yes Historical Provider, MD  HYDROcodone-acetaminophen (NORCO) 10-325 MG per tablet Take 1 tablet by mouth every 4 (four) hours as needed. pain 06/15/13  Yes Historical Provider, MD  lamoTRIgine (LAMICTAL) 100 MG tablet Take 150 mg by mouth 2 (two) times daily.    Yes Historical Provider, MD  latanoprost (XALATAN) 0.005 %  ophthalmic solution Place 1 drop into both eyes at bedtime. 04/20/13  Yes Historical Provider, MD  levothyroxine (SYNTHROID, LEVOTHROID) 50 MCG tablet Take 50 mcg by mouth daily.     Yes Historical Provider, MD  metoprolol succinate (TOPROL-XL) 50 MG 24 hr tablet Take 50 mg by mouth daily. 04/18/13  Yes Historical Provider, MD  montelukast (SINGULAIR) 10 MG tablet Take 10 mg by mouth daily. 05/03/13  Yes Historical Provider, MD  morphine (MS CONTIN) 30 MG 12 hr tablet Take 30 mg by mouth every 12 (twelve) hours. 06/15/13  Yes Historical Provider, MD  Multiple Vitamins-Minerals  (PRESERVISION/LUTEIN) CAPS Take 1 capsule by mouth daily.     Yes Historical Provider, MD  omeprazole (PRILOSEC) 20 MG capsule Take 20 mg by mouth daily. 04/18/13  Yes Historical Provider, MD  QUEtiapine (SEROQUEL) 50 MG tablet Take 50 mg by mouth at bedtime. 04/18/13  Yes Historical Provider, MD   Physical Exam: Filed Vitals:   07/09/13 2149  BP: 139/68  Pulse: 79  Temp:   Resp: 16    BP 139/68  Pulse 79  Temp(Src) 98.1 F (36.7 C) (Oral)  Resp 16  Ht 5' (1.524 m)  Wt 67.841 kg (149 lb 9 oz)  BMI 29.21 kg/m2  SpO2 95%  General:  Appears calm and comfortable Eyes: PERRL, normal lids, irises & conjunctiva ENT: grossly normal hearing, lips & tongue Neck: no LAD, masses or thyromegaly Cardiovascular: RRR, no m/r/g. No LE edema. Respiratory: CTA bilaterally, no w/r/r. Normal respiratory effort. Abdomen: soft, distended with diffuse pain noted Skin: no rash or induration seen on limited exam Musculoskeletal: grossly normal tone BUE/BLE Psychiatric: grossly normal mood and affect, speech fluent and appropriate Neurologic: grossly non-focal.          Labs on Admission:  Basic Metabolic Panel:  Recent Labs Lab 07/09/13 1955  NA 131*  K 4.0  CL 87*  CO2 31  GLUCOSE 120*  BUN 14  CREATININE 0.82  CALCIUM 8.4   Liver Function Tests:  Recent Labs Lab 07/09/13 1955  AST 27  ALT 18  ALKPHOS 111  BILITOT 0.4  PROT 7.1  ALBUMIN 3.4*   No results found for this basename: LIPASE, AMYLASE,  in the last 168 hours No results found for this basename: AMMONIA,  in the last 168 hours CBC:  Recent Labs Lab 07/09/13 1955  WBC 14.5*  NEUTROABS 10.5*  HGB 10.6*  HCT 33.7*  MCV 77.6*  PLT 394   Cardiac Enzymes: No results found for this basename: CKTOTAL, CKMB, CKMBINDEX, TROPONINI,  in the last 168 hours  BNP (last 3 results) No results found for this basename: PROBNP,  in the last 8760 hours CBG: No results found for this basename: GLUCAP,  in the last 168  hours  Radiological Exams on Admission: Ct Abdomen Pelvis W Contrast  07/09/2013   CLINICAL DATA:  Constipation, abdominal pain, and nausea for 1 day. Left lower quadrant pain. Ate a lot of sunflower seeds.  EXAM: CT ABDOMEN AND PELVIS WITH CONTRAST  TECHNIQUE: Multidetector CT imaging of the abdomen and pelvis was performed using the standard protocol following bolus administration of intravenous contrast.  CONTRAST:  27mL OMNIPAQUE IOHEXOL 300 MG/ML SOLN, 141mL OMNIPAQUE IOHEXOL 300 MG/ML SOLN  COMPARISON:  None.  FINDINGS: Mild atelectasis in the right lung base. Large esophageal hiatal hernia.  The gallbladder is moderately distended without wall thickening. No stones or ductal dilatation are demonstrated. There is pneumobilia present. In the absence of prior gallbladder surgery or  sphincterotomy, this could indicate biliary infection or fistula. Diffuse fatty infiltration of the pancreas. No focal liver lesions. The spleen, adrenal glands, kidneys, inferior vena cava, and retroperitoneal lymph nodes are unremarkable. Calcification of aorta without aneurysm. Stomach and small bowel are not distended and no wall thickening is appreciated. No free air or free fluid in the abdomen. Abdominal wall musculature appears intact.  Pelvis: The colon is diffusely filled with stool throughout. There is mild wall thickening in the sigmoid and descending region which may indicate colitis. No specific focal evidence of diverticulitis. Changes are most consistent with colitis. No pneumatosis. No portal venous gas. Uterus and ovaries are not enlarged. Bladder wall is not thickened. No free or loculated pelvic fluid collections. No pelvic mass or lymphadenopathy. No destructive bone lesions.  IMPRESSION: Diffusely stool-filled and mildly prominent colon with wall thickening in the descending and sigmoid region suggesting colitis and constipation. Distended gallbladder without specific inflammatory signs. Pneumobilia in the  intrahepatic bile ducts. In the absence of surgery or sphincterotomy, this could indicate infection or occult fistula.   Electronically Signed   By: Lucienne Capers M.D.   On: 07/09/2013 21:24     Assessment/Plan Principal Problem:   Diverticulitis Active Problems:   Hypothyroid   Asthma   1. Diverticulitis -will start on IVF and hydrate -pain control -start on Cipro IV -may need GI evaluation  2. Constipation -bowel regimen ordered -likely related to chronic narcotic usage  3. Hypothyroidism -will continue with home medications -check TSH  4. Asthma -continue with inhalers  5. Chronic pain -on pain meds    Code Status: Full Code (must indicate code status--if unknown or must be presumed, indicate so) Family Communication: None (indicate person spoken with, if applicable, with phone number if by telephone) Disposition Plan: Home (indicate anticipated LOS)  Time spent: 23min  Astou Lada A Miking Usrey [facility] Pager [phone]  **Disclaimer: This note may have been dictated with voice recognition software. Similar sounding words can inadvertently be transcribed and this note may contain transcription errors which may not have been corrected upon publication of note.**

## 2013-07-10 ENCOUNTER — Encounter (HOSPITAL_COMMUNITY): Payer: Self-pay | Admitting: Gastroenterology

## 2013-07-10 DIAGNOSIS — K5289 Other specified noninfective gastroenteritis and colitis: Secondary | ICD-10-CM

## 2013-07-10 DIAGNOSIS — F3289 Other specified depressive episodes: Secondary | ICD-10-CM

## 2013-07-10 DIAGNOSIS — K5900 Constipation, unspecified: Secondary | ICD-10-CM | POA: Diagnosis present

## 2013-07-10 DIAGNOSIS — G894 Chronic pain syndrome: Secondary | ICD-10-CM

## 2013-07-10 DIAGNOSIS — F329 Major depressive disorder, single episode, unspecified: Secondary | ICD-10-CM | POA: Diagnosis present

## 2013-07-10 DIAGNOSIS — F319 Bipolar disorder, unspecified: Secondary | ICD-10-CM | POA: Diagnosis present

## 2013-07-10 DIAGNOSIS — K5909 Other constipation: Principal | ICD-10-CM | POA: Diagnosis present

## 2013-07-10 HISTORY — DX: Chronic pain syndrome: G89.4

## 2013-07-10 LAB — COMPREHENSIVE METABOLIC PANEL
ALBUMIN: 3.1 g/dL — AB (ref 3.5–5.2)
ALT: 16 U/L (ref 0–35)
AST: 21 U/L (ref 0–37)
Alkaline Phosphatase: 119 U/L — ABNORMAL HIGH (ref 39–117)
BILIRUBIN TOTAL: 0.3 mg/dL (ref 0.3–1.2)
BUN: 11 mg/dL (ref 6–23)
CO2: 30 meq/L (ref 19–32)
Calcium: 8.3 mg/dL — ABNORMAL LOW (ref 8.4–10.5)
Chloride: 92 mEq/L — ABNORMAL LOW (ref 96–112)
Creatinine, Ser: 0.69 mg/dL (ref 0.50–1.10)
GFR calc Af Amer: >90 mL/min (ref >90)
GFR, EST NON AFRICAN AMERICAN: 87 mL/min — AB (ref >90)
Glucose, Bld: 96 mg/dL (ref 70–99)
Potassium: 3.9 mEq/L (ref 3.7–5.3)
Sodium: 135 mEq/L — ABNORMAL LOW (ref 137–147)
Total Protein: 6.7 g/dL (ref 6.0–8.3)

## 2013-07-10 LAB — CBC
HCT: 32.7 % — ABNORMAL LOW (ref 36.0–46.0)
HEMOGLOBIN: 10.4 g/dL — AB (ref 12.0–15.0)
MCH: 24.7 pg — ABNORMAL LOW (ref 26.0–34.0)
MCHC: 31.8 g/dL (ref 30.0–36.0)
MCV: 77.7 fL — ABNORMAL LOW (ref 78.0–100.0)
PLATELETS: 394 10*3/uL (ref 150–400)
RBC: 4.21 MIL/uL (ref 3.87–5.11)
RDW: 17.5 % — ABNORMAL HIGH (ref 11.5–15.5)
WBC: 11.6 10*3/uL — AB (ref 4.0–10.5)

## 2013-07-10 LAB — HEMOGLOBIN A1C
HEMOGLOBIN A1C: 5.7 % — AB (ref <5.7)
MEAN PLASMA GLUCOSE: 117 mg/dL — AB (ref <117)

## 2013-07-10 LAB — GLUCOSE, CAPILLARY: Glucose-Capillary: 77 mg/dL (ref 70–99)

## 2013-07-10 LAB — TSH: TSH: 1.64 u[IU]/mL (ref 0.350–4.500)

## 2013-07-10 MED ORDER — POLYETHYLENE GLYCOL 3350 17 G PO PACK
17.0000 g | PACK | Freq: Two times a day (BID) | ORAL | Status: DC
  Administered 2013-07-10: 17 g via ORAL
  Filled 2013-07-10: qty 1

## 2013-07-10 MED ORDER — METRONIDAZOLE IN NACL 5-0.79 MG/ML-% IV SOLN
500.0000 mg | Freq: Three times a day (TID) | INTRAVENOUS | Status: DC
  Administered 2013-07-10 – 2013-07-12 (×6): 500 mg via INTRAVENOUS
  Filled 2013-07-10 (×6): qty 100

## 2013-07-10 MED ORDER — POLYETHYLENE GLYCOL 3350 17 G PO PACK
34.0000 g | PACK | Freq: Two times a day (BID) | ORAL | Status: DC
  Administered 2013-07-10 – 2013-07-11 (×3): 34 g via ORAL
  Filled 2013-07-10 (×3): qty 2

## 2013-07-10 MED ORDER — SENNOSIDES-DOCUSATE SODIUM 8.6-50 MG PO TABS
1.0000 | ORAL_TABLET | Freq: Every day | ORAL | Status: DC
  Administered 2013-07-10 – 2013-07-11 (×2): 1 via ORAL
  Filled 2013-07-10 (×2): qty 1

## 2013-07-10 MED ORDER — SORBITOL 70 % SOLN
960.0000 mL | TOPICAL_OIL | Freq: Once | ORAL | Status: AC
  Administered 2013-07-10: 960 mL via RECTAL
  Filled 2013-07-10: qty 240

## 2013-07-10 MED ORDER — POLYETHYLENE GLYCOL 3350 17 G PO PACK
17.0000 g | PACK | Freq: Once | ORAL | Status: AC
  Administered 2013-07-10: 17 g via ORAL

## 2013-07-10 MED ORDER — MILK AND MOLASSES ENEMA
1.0000 | Freq: Once | RECTAL | Status: AC
  Administered 2013-07-10: 250 mL via RECTAL

## 2013-07-10 MED ORDER — CIPROFLOXACIN IN D5W 400 MG/200ML IV SOLN
400.0000 mg | Freq: Two times a day (BID) | INTRAVENOUS | Status: DC
  Administered 2013-07-10 – 2013-07-11 (×4): 400 mg via INTRAVENOUS
  Filled 2013-07-10 (×4): qty 200

## 2013-07-10 NOTE — Plan of Care (Signed)
Problem: Phase I Progression Outcomes Goal: Pain controlled with appropriate interventions Outcome: Progressing Patient still complains of abdominal pain due to constipation.

## 2013-07-10 NOTE — Progress Notes (Signed)
Utilization Review Complete  

## 2013-07-10 NOTE — Progress Notes (Signed)
Late Entry for POC:  Notified Dr. Grandville Silos of the patients constipation and suggested that she get a Milk and Molasses Enema.  The enema was not effective.  She had a small bm mostly mucous.  MD made aware and he placed more orders.  The SMOG enema was also given today and she did not really retain either enema.  I will pass on to the oncoming nurse to give 2nd SMOG enema as ordered.  The SMOG enema is located in the pharmacy on the desk.

## 2013-07-10 NOTE — Progress Notes (Signed)
Spoke with pharmacist at [facility] [employer]. Linzess and Amitiza will both run about 45$ monthly. Out of pocket expense once in "donut hole" would be in the 200 range for Linzess and around 280 for Amitiza. Will start Linzess once appropriate during this admission.  [name], ANP-BC [facility]

## 2013-07-10 NOTE — Consult Note (Signed)
Referring Provider: Dr. Humphrey Rolls Primary Care Physician:  Abigail Miyamoto, MD Primary Gastroenterologist:  Dr. Gala Romney  Date of Admission: 07/09/13 Date of Consultation: 07/10/13  Reason for Consultation:    HPI:  Heidi Fuentes is a 68 year old female with a history of chronic constipation, presenting to [facility] ED with worsening abdominal pain. Lower abdominal cramping, radiating around to lower back. Had taken OTC laxatives without improvement. MOM Saturday. Symptom onset about 2 weeks ago, gradually worsening. Lower abdominal pressure. Last BM last Tuesday. Scant hematochezia/mucus. Has to use digital maneuvers to relieve herself. Usually every 2-3 days has a BM, only with assistance. Has tried/failed Miralax as outpatient. Doesn't take on a daily basis. Waits until she gets constipated then takes MOM.   Tried to eat jello but everything feels like it stops in upper abdomen. Feels like it is going to burst open. No fever/chills as outpatient. Associated nausea. Used a glycerin suppository recently without any results.   Sometimes tries to drink something, doesn't go down, comes back out. Started on Prilosec. No breakthrough reflux. Intermittent solid food dysphagia for about a year. Possible EGD in [facility] remote past. Last colonoscopy in 2005 by Dr. Laural Golden with melanosis coli.   Past Medical History  Diagnosis Date  . Fibromyalgia   . Depression   . Thyroid disease   . Back pain   . Asthma   . Chronic pain syndrome 07/10/2013  . Duodenal papillary stenosis     Past Surgical History  Procedure Laterality Date  . Tonsillectomy    . Hemorroidectomy    . Hernia repair    . Coccyx removal    . Colonoscopy  2005    Dr. Laural Golden: mild melanosis coli, otherwise normal  . Ercp  1997    [facility]: biliary manometry abnormal, subsequent sphincterotomy     Prior to Admission medications   Medication Sig Start Date End Date Taking? Authorizing Provider  acetaminophen (TYLENOL) 500 MG tablet Take 500 mg  by mouth every 6 (six) hours as needed. For pain    Yes Historical Provider, MD  albuterol (VENTOLIN HFA) 108 (90 BASE) MCG/ACT inhaler Inhale 2 puffs into [facility] lungs every 6 (six) hours as needed. For shortness of breath    Yes Historical Provider, MD  Camphor-Menthol-Methyl Sal (SALONPAS) 1.2-5.7-6.3 % PTCH Apply 1 patch topically every 8 (eight) hours as needed. For pain    Yes Historical Provider, MD  cyclobenzaprine (FLEXERIL) 10 MG tablet Take 10 mg by mouth at bedtime.     Yes Historical Provider, MD  fluticasone (FLONASE) 50 MCG/ACT nasal spray Place 2 sprays into [facility] nose daily.     Yes Historical Provider, MD  furosemide (LASIX) 40 MG tablet Take 40 mg by mouth daily. 04/18/13  Yes Historical Provider, MD  HYDROcodone-acetaminophen (NORCO) 10-325 MG per tablet Take 1 tablet by mouth every 4 (four) hours as needed. pain 06/15/13  Yes Historical Provider, MD  lamoTRIgine (LAMICTAL) 100 MG tablet Take 150 mg by mouth 2 (two) times daily.    Yes Historical Provider, MD  latanoprost (XALATAN) 0.005 % ophthalmic solution Place 1 drop into both eyes at bedtime. 04/20/13  Yes Historical Provider, MD  levothyroxine (SYNTHROID, LEVOTHROID) 50 MCG tablet Take 50 mcg by mouth daily.     Yes Historical Provider, MD  metoprolol succinate (TOPROL-XL) 50 MG 24 hr tablet Take 50 mg by mouth daily. 04/18/13  Yes Historical Provider, MD  montelukast (SINGULAIR) 10 MG tablet Take 10 mg by mouth daily. 05/03/13  Yes Historical Provider, MD  morphine (MS CONTIN) 30 MG 12 hr tablet Take 30 mg by mouth every 12 (twelve) hours. 06/15/13  Yes Historical Provider, MD  Multiple Vitamins-Minerals (PRESERVISION/LUTEIN) CAPS Take 1 capsule by mouth daily.     Yes Historical Provider, MD  omeprazole (PRILOSEC) 20 MG capsule Take 20 mg by mouth daily. 04/18/13  Yes Historical Provider, MD  QUEtiapine (SEROQUEL) 50 MG tablet Take 50 mg by mouth at bedtime. 04/18/13  Yes Historical Provider, MD    Current Facility-Administered  Medications  Medication Dose Route Frequency Provider Last Rate Last Dose  . 0.9 %  sodium chloride infusion   Intravenous Continuous Allyne Gee, MD 75 mL/hr at 07/09/13 2327    . albuterol (PROVENTIL) (2.5 MG/3ML) 0.083% nebulizer solution 3 mL  3 mL Inhalation Q6H PRN Allyne Gee, MD      . aspirin EC tablet 81 mg  81 mg Oral Daily Allyne Gee, MD   81 mg at 07/10/13 0843  . ciprofloxacin (CIPRO) IVPB 400 mg  400 mg Intravenous Q12H Allyne Gee, MD   400 mg at 07/10/13 0855  . cyclobenzaprine (FLEXERIL) tablet 10 mg  10 mg Oral QHS Allyne Gee, MD   10 mg at 07/09/13 2337  . fluticasone (FLONASE) 50 MCG/ACT nasal spray 2 spray  2 spray Each Nare Daily Allyne Gee, MD   2 spray at 07/10/13 0855  . folic acid (FOLVITE) tablet 1 mg  1 mg Oral Daily Allyne Gee, MD   1 mg at 07/10/13 [id number]  . furosemide (LASIX) tablet 40 mg  40 mg Oral Daily Allyne Gee, MD   40 mg at 07/10/13 0843  . heparin injection 5,000 Units  5,000 Units Subcutaneous 3 times per day Allyne Gee, MD   5,000 Units at 07/10/13 0554  . HYDROcodone-acetaminophen (NORCO) 10-325 MG per tablet 1 tablet  1 tablet Oral Q4H PRN Allyne Gee, MD   1 tablet at 07/10/13 [id number]  . lamoTRIgine (LAMICTAL) tablet 150 mg  150 mg Oral BID Allyne Gee, MD      . latanoprost (XALATAN) 0.005 % ophthalmic solution 1 drop  1 drop Both Eyes QHS Allyne Gee, MD      . levothyroxine (SYNTHROID, LEVOTHROID) tablet 50 mcg  50 mcg Oral QAC breakfast Allyne Gee, MD   50 mcg at 07/10/13 0554  . magnesium hydroxide (MILK OF MAGNESIA) suspension 30 mL  30 mL Oral Daily PRN Allyne Gee, MD      . metoprolol succinate (TOPROL-XL) 24 hr tablet 50 mg  50 mg Oral Daily Allyne Gee, MD   50 mg at 07/10/13 0843  . metroNIDAZOLE (FLAGYL) IVPB 500 mg  500 mg Intravenous Q8H Irine Seal V, MD      . montelukast (SINGULAIR) tablet 10 mg  10 mg Oral Daily Allyne Gee, MD   10 mg at 07/10/13 0842  . morphine (MS CONTIN) 12 hr tablet 30 mg   30 mg Oral Q12H Allyne Gee, MD   30 mg at 07/09/13 2357  . morphine 2 MG/ML injection 2 mg  2 mg Intravenous Q4H PRN Allyne Gee, MD   2 mg at 07/10/13 0341  . multivitamin with minerals tablet 1 tablet  1 tablet Oral Daily Allyne Gee, MD   1 tablet at 07/10/13 0841  . ondansetron (ZOFRAN) tablet 4 mg  4 mg Oral Q6H PRN Allyne Gee, MD  Or  . ondansetron (ZOFRAN) injection 4 mg  4 mg Intravenous Q6H PRN Allyne Gee, MD      . pantoprazole (PROTONIX) EC tablet 40 mg  40 mg Oral Daily Allyne Gee, MD   40 mg at 07/10/13 0842  . polyethylene glycol (MIRALAX / GLYCOLAX) packet 17 g  17 g Oral BID Eugenie Filler, MD   17 g at 07/10/13 0841  . QUEtiapine (SEROQUEL) tablet 50 mg  50 mg Oral QHS Allyne Gee, MD   50 mg at 07/09/13 2337  . senna-docusate (Senokot-S) tablet 1 tablet  1 tablet Oral QHS Irine Seal V, MD      . sorbitol 70 % solution 30 mL  30 mL Oral Daily PRN Allyne Gee, MD   30 mL at 07/09/13 2357  . thiamine (VITAMIN B-1) tablet 100 mg  100 mg Oral Daily Allyne Gee, MD   100 mg at 07/10/13 0847    Allergies as of 07/09/2013 - Review Complete 07/09/2013  Allergen Reaction Noted  . Morphine and related Nausea Only 08/26/2010  . Sulfa antibiotics Rash 08/26/2010    Family History  Problem Relation Age of Onset  . Colon cancer Neg Hx   . Lung cancer Mother   . Heart attack Father     History   Social History  . Marital Status: Divorced    Spouse Name: N/A    Number of Children: N/A  . Years of Education: N/A   Occupational History  . Not on file.   Social History Main Topics  . Smoking status: Never Smoker   . Smokeless tobacco: Not on file  . Alcohol Use: No  . Drug Use: No  . Sexual Activity: Not on file   Other Topics Concern  . Not on file   Social History Narrative  . No narrative on file    Review of Systems: Gen: see HPI CV: Denies chest pain, heart palpitations, syncope, edema  Resp: +DOE GI: see HPI GU : +urinary  frequency, "hot urine" MS: back pain, arthritis Derm: Denies rash, itching, dry skin Psych: +depression, anxiety Heme: Denies bruising, bleeding, and enlarged lymph nodes.  Physical Exam: Vital signs in last 24 hours: Temp:  [98.1 F (36.7 C)-99.2 F (37.3 C)] 98.2 F (36.8 C) (06/08 0538) Pulse Rate:  [71-85] 73 (06/08 0843) Resp:  [16-22] 20 (06/08 0538) BP: (105-145)/(56-77) 145/75 mmHg (06/08 0843) SpO2:  [95 %-97 %] 97 % (06/08 0538) Weight:  [149 lb 9 oz (67.841 kg)-149 lb 14.4 oz (67.994 kg)] 149 lb 14.4 oz (67.994 kg) (06/08 0538) Last BM Date: 07/09/13 (very small bm in [facility] ed according to Fuentes) General:   Alert,  Well-developed, well-nourished, pleasant and cooperative in NAD Head:  Normocephalic and atraumatic. Eyes:  Sclera clear, no icterus.   Conjunctiva pink. Ears:  Normal auditory acuity. Nose:  No deformity, discharge,  or lesions. Mouth:  No deformity or lesions, dentition normal. Lungs:  Clear throughout to auscultation.   No wheezes, crackles, or rhonchi. No acute distress. Heart:  S1 S2 present; no murmurs, clicks, rubs,  or gallops. Abdomen:  +BS, full but soft, mild discomfort lower abdomen, LLQ, . No masses, hepatosplenomegaly or hernias noted.  Rectal:  Internal exam without impaction noted, soft stool in rectal vault, lax anal sphincter.  Msk:  Symmetrical without gross deformities. Normal posture. Extremities:  Without clubbing or edema. Neurologic:  Alert and  oriented x4;  grossly normal neurologically. Skin:  Intact without significant  lesions or rashes. Psych:  Alert and cooperative. Normal mood and affect.  Intake/Output from previous day: 06/07 0701 - 06/08 0700 In: -  Out: 500 [Urine:500] Intake/Output this shift: Total I/O In: 480 [P.O.:480] Out: -   Lab Results:  Recent Labs  07/09/13 1955 07/10/13 0717  WBC 14.5* 11.6*  HGB 10.6* 10.4*  HCT 33.7* 32.7*  PLT 394 394   BMET  Recent Labs  07/09/13 1955 07/10/13 0717  NA  131* 135*  K 4.0 3.9  CL 87* 92*  CO2 31 30  GLUCOSE 120* 96  BUN 14 11  CREATININE 0.82 0.69  CALCIUM 8.4 8.3*   LFT  Recent Labs  07/09/13 1955 07/10/13 0717  PROT 7.1 6.7  ALBUMIN 3.4* 3.1*  AST 27 21  ALT 18 16  ALKPHOS 111 119*  BILITOT 0.4 0.3   Studies/Results: Ct Abdomen Pelvis W Contrast  07/09/2013   CLINICAL DATA:  Constipation, abdominal pain, and nausea for 1 day. Left lower quadrant pain. Ate a lot of sunflower seeds.  EXAM: CT ABDOMEN AND PELVIS WITH CONTRAST  TECHNIQUE: Multidetector CT imaging of [facility] abdomen and pelvis was performed using [facility] standard protocol following bolus administration of intravenous contrast.  CONTRAST:  61mL OMNIPAQUE IOHEXOL 300 MG/ML SOLN, 189mL OMNIPAQUE IOHEXOL 300 MG/ML SOLN  COMPARISON:  None.  FINDINGS: Mild atelectasis in [facility] right lung base. Large esophageal hiatal hernia.  [facility]idi gallbladder is moderately distended without wall thickening. No stones or ductal dilatation are demonstrated. There is pneumobilia present. In [facility] absence of prior gallbladder surgery or sphincterotomy, this could indicate biliary infection or fistula. Diffuse fatty infiltration of [facility] pancreas. No focal liver lesions. [facility] spleen, adrenal glands, kidneys, inferior vena cava, and retroperitoneal lymph nodes are unremarkable. Calcification of aorta without aneurysm. Stomach and small bowel are not distended and no wall thickening is appreciated. No free air or free fluid in [facility] abdomen. Abdominal wall musculature appears intact.  Pelvis: [facility] colon is diffusely filled with stool throughout. There is mild wall thickening in [facility] sigmoid and descending region which may indicate colitis. No specific focal evidence of diverticulitis. Changes are most consistent with colitis. No pneumatosis. No portal venous gas. Uterus and ovaries are not enlarged. Bladder wall is not thickened. No free or loculated pelvic fluid collections. No pelvic mass or lymphadenopathy. No destructive bone  lesions.  IMPRESSION: Diffusely stool-filled and mildly prominent colon with wall thickening in [facility] descending and sigmoid region suggesting colitis and constipation. Distended gallbladder without specific inflammatory signs. Pneumobilia in [facility] intrahepatic bile ducts. In [facility] absence of surgery or sphincterotomy, this could indicate infection or occult fistula.   Electronically Signed   By: Lucienne Capers M.D.   On: 07/09/2013 21:24    Impression: 68 year old female admitted with acute on chronic constipation, with CT noting stool throughout colon with wall-thickening in [facility] descending and sigmoid region, possible colitis. Non-specific in this setting, now with empiric treatment with Cipro and Flagyl. No fecal impaction on rectal exam. Milk and molasses enema ordered per attending. Constipation likely opiod-induced, as Fuentes is on chronic narcotics and no real bowel regimen as an outpatient. Colonoscopy is due this year as well, which can be performed as an outpatient.   Would benefit from Dooms or Amitia, but Fuentes is hesitant to start unless she knows details of insurance coverage for these agents. Will research this on our end. Agree with milk and molasses enema; continue Miralax but increase to BID for now. Will likely start Amitiza or Linzess  tomorrow after checking with insurance coverage.   Esophageal dysphagia: chronic, at least 1 year. No acute indication for EGD this admission. Could pursue as outpatient at time of colonoscopy.   Pneumobilia in intrahepatic bile ducts noted: Fuentes with history of sphincterotomy in 1997 due to papillary stenosis. Performed at [facility].   Anemia: unclear baseline. Follow-up as outpatient. No overt signs of GI bleeding or concerning features currently.  Plan: Agree with TSH Continue PPI daily Milk and molasses enema Double dose Miralax BID for now Linzess or Amitiza to start tomorrow (will research insurance coverage first) Empiric antibiotics for 7  days EGD with dilation and Colonoscopy as outpatient Follow-up anemia as outpatient   [name], ANP-BC [facility]      LOS: 1 day    07/10/2013, 12:07 PM  Attending note:  Fuentes seen and examined. CT reviewed. Obstipation seems to be a big problem. Left colon wall thickening nonspecific and may be somewhat artifactual. I suppose there could be some end organ ischemia involving [facility] left colon secondary to [facility] large stool load producing a pressure effect.  SMOG enema per hospitalist..  Agree with titrating Miralax and adding Linzess 290 to her regimen. Also, pt states she's been eating a bag full of sunflower seeds daily to facilitate BM's, I have asked [facility] Fuentes to stop this for now.

## 2013-07-10 NOTE — Progress Notes (Signed)
SMOG enema given PR as ordered, patient unable to hold enema on 3 separate intervals of administration, no stool returned in [facility]

## 2013-07-10 NOTE — Progress Notes (Signed)
[facility] PROGRESS NOTE  Heidi Fuentes RKY:[id number] DOB: [birth year] DOA: 07/09/2013 PCP: Abigail Miyamoto, MD  Assessment/Plan: #1 constipation/obstipation Likely narcotic induced. Patient stated that milk of Magnesia at home with no results as well as MiraLAX. Patient was given a milk of molasses enema today with no bowel movement. Patient has been placed on bowel meds regimen of MiraLAX twice daily and Senokot. We'll give a SMOG enema. GI following and appreciate input and recommendations.  #2 colitis Differential could include diverticulitis versus an ischemic colitis versus secondary to problem #1. Patient had indeed in sunflower seeds daily to facilitate bowel movements prior to admission. Continue empiric IV ciprofloxacin and IV Flagyl. GI following and appreciate input and recommendations.  #3 hypothyroidism TSH within normal limits at 1.640. Continue Synthroid.  #4 asthma Stable.  #5 chronic pain Continue pain medications. Patient has been started on a bowel regimen. Follow.  #6 depression Stable.  Code Status: Full Family Communication: Updated patient no family at bedside. Disposition Plan: Home when medically stable.   Consultants:  Gastroenterology: Dr. Gala Romney 07/10/2013  Procedures:  CT abdomen and pelvis 07/09/2013  Antibiotics:  IV Cipro 07/09/2013  IV Flagyl 07/09/2013  HPI/Subjective: Patient c/o lower abdominal pain. Patient c/o constipation. Patient with no results after milk of molasses enema  Objective: Filed Vitals:   07/10/13 1527  BP: 114/64  Pulse: 68  Temp: 98.8 F (37.1 C)  Resp: 20    Intake/Output Summary (Last 24 hours) at 07/10/13 1806 Last data filed at 07/10/13 1443  Gross per 24 hour  Intake    720 ml  Output    500 ml  Net    220 ml   Filed Weights   07/09/13 1735 07/09/13 2300 07/10/13 0538  Weight: 67.841 kg (149 lb 9 oz) 67.994 kg (149 lb 14.4 oz) 67.994 kg (149 lb 14.4 oz)    Exam:   General:   NAD  Cardiovascular: RRR  Respiratory: CTAB  Abdomen: Soft/NT/ND/+BS  Musculoskeletal: No c/c/e  Data Reviewed: Basic Metabolic Panel:  Recent Labs Lab 07/09/13 1955 07/10/13 0717  NA 131* 135*  K 4.0 3.9  CL 87* 92*  CO2 31 30  GLUCOSE 120* 96  BUN 14 11  CREATININE 0.82 0.69  CALCIUM 8.4 8.3*   Liver Function Tests:  Recent Labs Lab 07/09/13 1955 07/10/13 0717  AST 27 21  ALT 18 16  ALKPHOS 111 119*  BILITOT 0.4 0.3  PROT 7.1 6.7  ALBUMIN 3.4* 3.1*   No results found for this basename: LIPASE, AMYLASE,  in the last 168 hours No results found for this basename: AMMONIA,  in the last 168 hours CBC:  Recent Labs Lab 07/09/13 1955 07/10/13 0717  WBC 14.5* 11.6*  NEUTROABS 10.5*  --   HGB 10.6* 10.4*  HCT 33.7* 32.7*  MCV 77.6* 77.7*  PLT 394 394   Cardiac Enzymes: No results found for this basename: CKTOTAL, CKMB, CKMBINDEX, TROPONINI,  in the last 168 hours BNP (last 3 results) No results found for this basename: PROBNP,  in the last 8760 hours CBG:  Recent Labs Lab 07/10/13 0735  GLUCAP 77    No results found for this or any previous visit (from the past 240 hour(s)).   Studies: Ct Abdomen Pelvis W Contrast  07/09/2013   CLINICAL DATA:  Constipation, abdominal pain, and nausea for 1 day. Left lower quadrant pain. Ate a lot of sunflower seeds.  EXAM: CT ABDOMEN AND PELVIS WITH CONTRAST  TECHNIQUE: Multidetector CT imaging of  the abdomen and pelvis was performed using the standard protocol following bolus administration of intravenous contrast.  CONTRAST:  49mL OMNIPAQUE IOHEXOL 300 MG/ML SOLN, 149mL OMNIPAQUE IOHEXOL 300 MG/ML SOLN  COMPARISON:  None.  FINDINGS: Mild atelectasis in the right lung base. Large esophageal hiatal hernia.  The gallbladder is moderately distended without wall thickening. No stones or ductal dilatation are demonstrated. There is pneumobilia present. In the absence of prior gallbladder surgery or sphincterotomy, this could  indicate biliary infection or fistula. Diffuse fatty infiltration of the pancreas. No focal liver lesions. The spleen, adrenal glands, kidneys, inferior vena cava, and retroperitoneal lymph nodes are unremarkable. Calcification of aorta without aneurysm. Stomach and small bowel are not distended and no wall thickening is appreciated. No free air or free fluid in the abdomen. Abdominal wall musculature appears intact.  Pelvis: The colon is diffusely filled with stool throughout. There is mild wall thickening in the sigmoid and descending region which may indicate colitis. No specific focal evidence of diverticulitis. Changes are most consistent with colitis. No pneumatosis. No portal venous gas. Uterus and ovaries are not enlarged. Bladder wall is not thickened. No free or loculated pelvic fluid collections. No pelvic mass or lymphadenopathy. No destructive bone lesions.  IMPRESSION: Diffusely stool-filled and mildly prominent colon with wall thickening in the descending and sigmoid region suggesting colitis and constipation. Distended gallbladder without specific inflammatory signs. Pneumobilia in the intrahepatic bile ducts. In the absence of surgery or sphincterotomy, this could indicate infection or occult fistula.   Electronically Signed   By: Lucienne Capers M.D.   On: 07/09/2013 21:24    Scheduled Meds: . aspirin EC  81 mg Oral Daily  . ciprofloxacin  400 mg Intravenous Q12H  . cyclobenzaprine  10 mg Oral QHS  . fluticasone  2 spray Each Nare Daily  . folic acid  1 mg Oral Daily  . furosemide  40 mg Oral Daily  . heparin  5,000 Units Subcutaneous 3 times per day  . lamoTRIgine  150 mg Oral BID  . latanoprost  1 drop Both Eyes QHS  . levothyroxine  50 mcg Oral QAC breakfast  . metoprolol succinate  50 mg Oral Daily  . metronidazole  500 mg Intravenous Q8H  . montelukast  10 mg Oral Daily  . morphine  30 mg Oral Q12H  . multivitamin with minerals  1 tablet Oral Daily  . pantoprazole  40 mg  Oral Daily  . polyethylene glycol  34 g Oral BID  . QUEtiapine  50 mg Oral QHS  . senna-docusate  1 tablet Oral QHS  . thiamine  100 mg Oral Daily   Continuous Infusions: . sodium chloride 75 mL/hr at 07/09/13 2327    Principal Problem:   Colitis Active Problems:   Hypothyroid   Asthma   Unspecified constipation   Chronic pain syndrome   Depression    Time spent: 35 mins    Eugenie Filler MD [facility] Pager [phone]. If 7PM-7AM, please contact night-coverage at [email or web address], password [facility] 07/10/2013, 6:06 PM  LOS: 1 day

## 2013-07-10 NOTE — Progress Notes (Signed)
ANTIBIOTIC CONSULT NOTE - INITIAL  Pharmacy Consult for Cipro Indication: Intra-abdominal infection  Allergies  Allergen Reactions  . Morphine And Related Nausea Only  . Sulfa Antibiotics Rash   Patient Measurements: Height: 5' (152.4 cm) Weight: 149 lb 14.4 oz (67.994 kg) IBW/kg (Calculated) : 45.5  Vital Signs: Temp: 98.2 F (36.8 C) (06/08 0538) Temp src: Oral (06/08 0538) BP: 105/58 mmHg (06/08 0538) Pulse Rate: 71 (06/08 0538) Intake/Output from previous day: 06/07 0701 - 06/08 0700 In: -  Out: 500 [Urine:500] Intake/Output from this shift:    Labs:  Recent Labs  07/09/13 1955 07/10/13 0717  WBC 14.5* 11.6*  HGB 10.6* 10.4*  PLT 394 394  CREATININE 0.82  --    Estimated Creatinine Clearance: 56.5 ml/min (by C-G formula based on Cr of 0.82). No results found for this basename: VANCOTROUGH, VANCOPEAK, VANCORANDOM, GENTTROUGH, GENTPEAK, GENTRANDOM, TOBRATROUGH, TOBRAPEAK, TOBRARND, AMIKACINPEAK, AMIKACINTROU, AMIKACIN,  in the last 72 hours   Microbiology: No results found for this or any previous visit (from the past 720 hour(s)).  Medical History: Past Medical History  Diagnosis Date  . Fibromyalgia   . Depression   . Thyroid disease   . Back pain   . Asthma    Medications:  Scheduled:  . aspirin EC  81 mg Oral Daily  . ciprofloxacin  400 mg Intravenous Q12H  . cyclobenzaprine  10 mg Oral QHS  . fluticasone  2 spray Each Nare Daily  . folic acid  1 mg Oral Daily  . furosemide  40 mg Oral Daily  . heparin  5,000 Units Subcutaneous 3 times per day  . lamoTRIgine  150 mg Oral BID  . latanoprost  1 drop Both Eyes QHS  . levothyroxine  50 mcg Oral QAC breakfast  . metoprolol succinate  50 mg Oral Daily  . montelukast  10 mg Oral Daily  . morphine  30 mg Oral Q12H  . multivitamin with minerals  1 tablet Oral Daily  . pantoprazole  40 mg Oral Daily  . QUEtiapine  50 mg Oral QHS  . thiamine  100 mg Oral Daily   Assessment: 68yo female c/o chronic  constipation and possible colitis.  Asked to initiate Cipro for intra-abdominal infection. Estimated Creatinine Clearance: 57.9 ml/min (by C-G formula based on Cr of 0.69).  Goal of Therapy:  Eradicate infection.  Plan:  Cipro 400mg  IV q12hrs Monitor labs, renal fxn, and progress  [facility]idi Fuentes 07/10/2013,7:43 AM

## 2013-07-10 NOTE — Care Management Note (Addendum)
    Page 1 of 1   07/12/2013     1:26:56 PM CARE MANAGEMENT NOTE 07/12/2013  Patient:  Heidi Fuentes, Heidi Fuentes   Account Number:  [id number]  Date Initiated:  07/10/2013  Documentation initiated by:  Claretha Cooper  Subjective/Objective Assessment:   Pt is from home alone. States she has two sons that are very helpful and has a great friend for additional support. No HH/DME anticipated or identified.     Action/Plan:   Anticipated DC Date:  07/11/2013   Anticipated DC Plan:  [place]  CM consult      Choice offered to / List presented to:             Status of service:  Completed, signed off Medicare Important Message given?  YES (If response is "NO", the following Medicare IM given date fields will be blank) Date Medicare IM given:  07/12/2013 Date Additional Medicare IM given:    Discharge Disposition:  HOME/SELF CARE  Per UR Regulation:    If discussed at Long Length of Stay Meetings, dates discussed:    Comments:  07/10/13 Claretha Cooper RN CM

## 2013-07-11 LAB — BASIC METABOLIC PANEL
BUN: 10 mg/dL (ref 6–23)
CHLORIDE: 97 meq/L (ref 96–112)
CO2: 27 meq/L (ref 19–32)
Calcium: 7.9 mg/dL — ABNORMAL LOW (ref 8.4–10.5)
Creatinine, Ser: 0.64 mg/dL (ref 0.50–1.10)
GFR calc Af Amer: >90 mL/min (ref >90)
GFR calc non Af Amer: 90 mL/min — ABNORMAL LOW (ref >90)
Glucose, Bld: 108 mg/dL — ABNORMAL HIGH (ref 70–99)
POTASSIUM: 4.1 meq/L (ref 3.7–5.3)
SODIUM: 135 meq/L — AB (ref 137–147)

## 2013-07-11 LAB — CBC
HCT: 29.1 % — ABNORMAL LOW (ref 36.0–46.0)
Hemoglobin: 9.1 g/dL — ABNORMAL LOW (ref 12.0–15.0)
MCH: 24.1 pg — ABNORMAL LOW (ref 26.0–34.0)
MCHC: 31.3 g/dL (ref 30.0–36.0)
MCV: 77.2 fL — ABNORMAL LOW (ref 78.0–100.0)
PLATELETS: 369 10*3/uL (ref 150–400)
RBC: 3.77 MIL/uL — ABNORMAL LOW (ref 3.87–5.11)
RDW: 17.3 % — ABNORMAL HIGH (ref 11.5–15.5)
WBC: 9.5 10*3/uL (ref 4.0–10.5)

## 2013-07-11 LAB — GLUCOSE, CAPILLARY: GLUCOSE-CAPILLARY: 104 mg/dL — AB (ref 70–99)

## 2013-07-11 LAB — MAGNESIUM: Magnesium: 3.2 mg/dL — ABNORMAL HIGH (ref 1.5–2.5)

## 2013-07-11 MED ORDER — PANTOPRAZOLE SODIUM 40 MG PO TBEC
40.0000 mg | DELAYED_RELEASE_TABLET | Freq: Every day | ORAL | Status: DC
  Administered 2013-07-11 – 2013-07-12 (×2): 40 mg via ORAL
  Filled 2013-07-11 (×2): qty 1

## 2013-07-11 MED ORDER — LINACLOTIDE 145 MCG PO CAPS
290.0000 ug | ORAL_CAPSULE | Freq: Every day | ORAL | Status: DC
  Administered 2013-07-11 – 2013-07-12 (×2): 290 ug via ORAL
  Filled 2013-07-11 (×2): qty 2

## 2013-07-11 NOTE — Progress Notes (Signed)
REVIEWED.  

## 2013-07-11 NOTE — Progress Notes (Signed)
[facility] PROGRESS NOTE  Heidi Fuentes HYQ:[id number] DOB: 10-02-1945 DOA: 07/09/2013 PCP: Abigail Miyamoto, MD  Assessment/Plan: #1 constipation/obstipation Likely narcotic induced. Patient now states bowels are moving and having multiple bowel movements today. Continue bowel regimen of MiraLAX twice daily and Senokot. Diet has been advanced. GI following and appreciate input and recommendations.   #2 colitis Differential could include diverticulitis versus an ischemic colitis versus secondary to problem #1. Patient had sunflower seeds daily to facilitate bowel movements prior to admission. Continue empiric IV ciprofloxacin and IV Flagyl. GI following and appreciate input and recommendations.  #3 hypothyroidism TSH within normal limits at 1.640. Continue Synthroid.  #4 asthma Stable.  #5 chronic pain Continue pain medications. Patient has been started on a bowel regimen. Follow.  #6 depression Stable.  Code Status: Full Family Communication: Updated patient no family at bedside. Disposition Plan: Home when medically stable. Home soon in the next 24-48 hours.   Consultants:  Gastroenterology: Dr. Gala Romney 07/10/2013  Procedures:  CT abdomen and pelvis 07/09/2013  Antibiotics:  IV Cipro 07/09/2013  IV Flagyl 07/09/2013  HPI/Subjective: Patient states lower abdominal pain improving. Patient states having BM and now with 6th episode. Patient sitting on commode.  Objective: Filed Vitals:   07/11/13 1511  BP: 96/57  Pulse: 68  Temp: 97.9 F (36.6 C)  Resp: 20    Intake/Output Summary (Last 24 hours) at 07/11/13 1643 Last data filed at 07/11/13 1606  Gross per 24 hour  Intake   1480 ml  Output      0 ml  Net   1480 ml   Filed Weights   07/09/13 1735 07/09/13 2300 07/10/13 0538  Weight: 67.841 kg (149 lb 9 oz) 67.994 kg (149 lb 14.4 oz) 67.994 kg (149 lb 14.4 oz)    Exam:   General:  NAD  Cardiovascular: RRR  Respiratory: CTAB  Abdomen:  Soft/NT/ND/+BS  Musculoskeletal: No c/c/e  Data Reviewed: Basic Metabolic Panel:  Recent Labs Lab 07/09/13 1955 07/10/13 0717 07/11/13 0619  NA 131* 135* 135*  K 4.0 3.9 4.1  CL 87* 92* 97  CO2 31 30 27   GLUCOSE 120* 96 108*  BUN 14 11 10   CREATININE 0.82 0.69 0.64  CALCIUM 8.4 8.3* 7.9*  MG  --   --  3.2*   Liver Function Tests:  Recent Labs Lab 07/09/13 1955 07/10/13 0717  AST 27 21  ALT 18 16  ALKPHOS 111 119*  BILITOT 0.4 0.3  PROT 7.1 6.7  ALBUMIN 3.4* 3.1*   No results found for this basename: LIPASE, AMYLASE,  in the last 168 hours No results found for this basename: AMMONIA,  in the last 168 hours CBC:  Recent Labs Lab 07/09/13 1955 07/10/13 0717 07/11/13 0619  WBC 14.5* 11.6* 9.5  NEUTROABS 10.5*  --   --   HGB 10.6* 10.4* 9.1*  HCT 33.7* 32.7* 29.1*  MCV 77.6* 77.7* 77.2*  PLT 394 394 369   Cardiac Enzymes: No results found for this basename: CKTOTAL, CKMB, CKMBINDEX, TROPONINI,  in the last 168 hours BNP (last 3 results) No results found for this basename: PROBNP,  in the last 8760 hours CBG:  Recent Labs Lab 07/10/13 0735 07/11/13 0727  GLUCAP 77 104*    No results found for this or any previous visit (from the past 240 hour(s)).   Studies: Ct Abdomen Pelvis W Contrast  07/09/2013   CLINICAL DATA:  Constipation, abdominal pain, and nausea for 1 day. Left lower quadrant pain. Ate a  lot of sunflower seeds.  EXAM: CT ABDOMEN AND PELVIS WITH CONTRAST  TECHNIQUE: Multidetector CT imaging of the abdomen and pelvis was performed using the standard protocol following bolus administration of intravenous contrast.  CONTRAST:  63mL OMNIPAQUE IOHEXOL 300 MG/ML SOLN, 174mL OMNIPAQUE IOHEXOL 300 MG/ML SOLN  COMPARISON:  None.  FINDINGS: Mild atelectasis in the right lung base. Large esophageal hiatal hernia.  The gallbladder is moderately distended without wall thickening. No stones or ductal dilatation are demonstrated. There is pneumobilia present.  In the absence of prior gallbladder surgery or sphincterotomy, this could indicate biliary infection or fistula. Diffuse fatty infiltration of the pancreas. No focal liver lesions. The spleen, adrenal glands, kidneys, inferior vena cava, and retroperitoneal lymph nodes are unremarkable. Calcification of aorta without aneurysm. Stomach and small bowel are not distended and no wall thickening is appreciated. No free air or free fluid in the abdomen. Abdominal wall musculature appears intact.  Pelvis: The colon is diffusely filled with stool throughout. There is mild wall thickening in the sigmoid and descending region which may indicate colitis. No specific focal evidence of diverticulitis. Changes are most consistent with colitis. No pneumatosis. No portal venous gas. Uterus and ovaries are not enlarged. Bladder wall is not thickened. No free or loculated pelvic fluid collections. No pelvic mass or lymphadenopathy. No destructive bone lesions.  IMPRESSION: Diffusely stool-filled and mildly prominent colon with wall thickening in the descending and sigmoid region suggesting colitis and constipation. Distended gallbladder without specific inflammatory signs. Pneumobilia in the intrahepatic bile ducts. In the absence of surgery or sphincterotomy, this could indicate infection or occult fistula.   Electronically Signed   By: Lucienne Capers M.D.   On: 07/09/2013 21:24    Scheduled Meds: . aspirin EC  81 mg Oral Daily  . ciprofloxacin  400 mg Intravenous Q12H  . cyclobenzaprine  10 mg Oral QHS  . fluticasone  2 spray Each Nare Daily  . folic acid  1 mg Oral Daily  . furosemide  40 mg Oral Daily  . heparin  5,000 Units Subcutaneous 3 times per day  . lamoTRIgine  150 mg Oral BID  . latanoprost  1 drop Both Eyes QHS  . levothyroxine  50 mcg Oral QAC breakfast  . Linaclotide  290 mcg Oral Daily  . metoprolol succinate  50 mg Oral Daily  . metronidazole  500 mg Intravenous Q8H  . montelukast  10 mg Oral  Daily  . morphine  30 mg Oral Q12H  . multivitamin with minerals  1 tablet Oral Daily  . pantoprazole  40 mg Oral Daily  . polyethylene glycol  34 g Oral BID  . QUEtiapine  50 mg Oral QHS  . senna-docusate  1 tablet Oral QHS  . thiamine  100 mg Oral Daily   Continuous Infusions: . sodium chloride 75 mL/hr at 07/11/13 1606    Principal Problem:   Colitis Active Problems:   Hypothyroid   Asthma   Unspecified constipation   Chronic pain syndrome   Depression    Time spent: 35 mins    Eugenie Filler MD [facility] Pager [phone]. If 7PM-7AM, please contact night-coverage at [email or web address], password [facility] 07/11/2013, 4:43 PM  LOS: 2 days

## 2013-07-11 NOTE — Progress Notes (Signed)
Subjective: Had a BM this morning, stating it made a sound like "kerplunk". Feels things are starting to move. Less abdominal discomfort, less nausea. Desires to advance diet. Concerned about outpatient cost of Linzess.   Objective: Vital signs in last 24 hours: Temp:  [98.8 F (37.1 C)-99 F (37.2 C)] 98.8 F (37.1 C) (06/09 0518) Pulse Rate:  [68-95] 95 (06/09 0518) Resp:  [20] 20 (06/09 0518) BP: (94-145)/(48-75) 94/48 mmHg (06/09 0518) SpO2:  [94 %-100 %] 95 % (06/09 0518) Last BM Date: 07/10/13 General:   Alert and oriented, pleasant Abdomen:  Bowel sounds present, soft, non-tender, non-distended. No HSM or hernias noted.  Neurologic:  Alert and  oriented x4;  grossly normal neurologically. Skin:  Warm and dry, intact without significant lesions.  Psych:  Alert and cooperative. Normal mood and affect.  Intake/Output from previous day: 06/08 0701 - 06/09 0700 In: 960 [P.O.:960] Out: -  Intake/Output this shift:    Lab Results:  Recent Labs  07/09/13 1955 07/10/13 0717 07/11/13 0619  WBC 14.5* 11.6* 9.5  HGB 10.6* 10.4* 9.1*  HCT 33.7* 32.7* 29.1*  PLT 394 394 369   BMET  Recent Labs  07/09/13 1955 07/10/13 0717 07/11/13 0619  NA 131* 135* 135*  K 4.0 3.9 4.1  CL 87* 92* 97  CO2 31 30 27   GLUCOSE 120* 96 108*  BUN 14 11 10   CREATININE 0.82 0.69 0.64  CALCIUM 8.4 8.3* 7.9*   LFT  Recent Labs  07/09/13 1955 07/10/13 0717  PROT 7.1 6.7  ALBUMIN 3.4* 3.1*  AST 27 21  ALT 18 16  ALKPHOS 111 119*  BILITOT 0.4 0.3     Studies/Results: Ct Abdomen Pelvis W Contrast  07/09/2013   CLINICAL DATA:  Constipation, abdominal pain, and nausea for 1 day. Left lower quadrant pain. Ate a lot of sunflower seeds.  EXAM: CT ABDOMEN AND PELVIS WITH CONTRAST  TECHNIQUE: Multidetector CT imaging of the abdomen and pelvis was performed using the standard protocol following bolus administration of intravenous contrast.  CONTRAST:  24mL OMNIPAQUE IOHEXOL 300 MG/ML  SOLN, 167mL OMNIPAQUE IOHEXOL 300 MG/ML SOLN  COMPARISON:  None.  FINDINGS: Mild atelectasis in the right lung base. Large esophageal hiatal hernia.  The gallbladder is moderately distended without wall thickening. No stones or ductal dilatation are demonstrated. There is pneumobilia present. In the absence of prior gallbladder surgery or sphincterotomy, this could indicate biliary infection or fistula. Diffuse fatty infiltration of the pancreas. No focal liver lesions. The spleen, adrenal glands, kidneys, inferior vena cava, and retroperitoneal lymph nodes are unremarkable. Calcification of aorta without aneurysm. Stomach and small bowel are not distended and no wall thickening is appreciated. No free air or free fluid in the abdomen. Abdominal wall musculature appears intact.  Pelvis: The colon is diffusely filled with stool throughout. There is mild wall thickening in the sigmoid and descending region which may indicate colitis. No specific focal evidence of diverticulitis. Changes are most consistent with colitis. No pneumatosis. No portal venous gas. Uterus and ovaries are not enlarged. Bladder wall is not thickened. No free or loculated pelvic fluid collections. No pelvic mass or lymphadenopathy. No destructive bone lesions.  IMPRESSION: Diffusely stool-filled and mildly prominent colon with wall thickening in the descending and sigmoid region suggesting colitis and constipation. Distended gallbladder without specific inflammatory signs. Pneumobilia in the intrahepatic bile ducts. In the absence of surgery or sphincterotomy, this could indicate infection or occult fistula.   Electronically Signed   By: Gwyndolyn Saxon  Gerilyn Nestle M.D.   On: 07/09/2013 21:24    Assessment: 68 year old female admitted with constipation/obstipation in the setting of chronic narcotics, with question of colitis which is non-specific. May actually be secondary to large stool load creating a pressure effect. Thus far has had Milk and  molasses enema, SMOG enema. Double dose Miralax BID. Patient feels improved from admission and desires to advance diet.   Anemia: unclear baseline. Follow-up as outpatient.  Esophageal dysphagia: chronic. Pursue EGD as outpatient at time of colonoscopy.   Plan: Linzess 290 mcg each morning, 30 minutes prior to breakfast Miralax 34 g BID Advance to low fat/soft diet Colonoscopy/EGD with dilation as outpatient Anemia follow-up as outpatient Empiric antibiotics for ?colitis X 7 days PPI daily   [name], ANP-BC [facility]    LOS: 2 days    07/11/2013, 8:04 AM

## 2013-07-12 ENCOUNTER — Telehealth: Payer: Self-pay | Admitting: Gastroenterology

## 2013-07-12 DIAGNOSIS — D649 Anemia, unspecified: Secondary | ICD-10-CM

## 2013-07-12 DIAGNOSIS — R1319 Other dysphagia: Secondary | ICD-10-CM

## 2013-07-12 LAB — URINALYSIS, ROUTINE W REFLEX MICROSCOPIC
Bilirubin Urine: NEGATIVE
Glucose, UA: NEGATIVE mg/dL
Hgb urine dipstick: NEGATIVE
Ketones, ur: NEGATIVE mg/dL
Nitrite: NEGATIVE
Protein, ur: NEGATIVE mg/dL
Specific Gravity, Urine: <1.005 — ABNORMAL LOW (ref 1.005–1.030)
Urobilinogen, UA: 0.2 mg/dL (ref 0.0–1.0)
pH: 6 (ref 5.0–8.0)

## 2013-07-12 LAB — IRON AND TIBC
IRON: 18 ug/dL — AB (ref 42–135)
Saturation Ratios: 7 % — ABNORMAL LOW (ref 20–55)
TIBC: 252 ug/dL (ref 250–470)
UIBC: 234 ug/dL (ref 125–400)

## 2013-07-12 LAB — GLUCOSE, CAPILLARY: Glucose-Capillary: 92 mg/dL (ref 70–99)

## 2013-07-12 LAB — URINE MICROSCOPIC-ADD ON

## 2013-07-12 LAB — CBC
HCT: 27 % — ABNORMAL LOW (ref 36.0–46.0)
Hemoglobin: 8.5 g/dL — ABNORMAL LOW (ref 12.0–15.0)
MCH: 24.6 pg — ABNORMAL LOW (ref 26.0–34.0)
MCHC: 31.5 g/dL (ref 30.0–36.0)
MCV: 78 fL (ref 78.0–100.0)
PLATELETS: 318 10*3/uL (ref 150–400)
RBC: 3.46 MIL/uL — AB (ref 3.87–5.11)
RDW: 17.6 % — ABNORMAL HIGH (ref 11.5–15.5)
WBC: 5.8 10*3/uL (ref 4.0–10.5)

## 2013-07-12 LAB — BASIC METABOLIC PANEL
BUN: 8 mg/dL (ref 6–23)
CALCIUM: 8.2 mg/dL — AB (ref 8.4–10.5)
CO2: 27 meq/L (ref 19–32)
Chloride: 104 mEq/L (ref 96–112)
Creatinine, Ser: 0.71 mg/dL (ref 0.50–1.10)
GFR calc Af Amer: >90 mL/min (ref >90)
GFR calc non Af Amer: 87 mL/min — ABNORMAL LOW (ref >90)
Glucose, Bld: 94 mg/dL (ref 70–99)
Potassium: 4.2 mEq/L (ref 3.7–5.3)
SODIUM: 141 meq/L (ref 137–147)

## 2013-07-12 LAB — FERRITIN: Ferritin: 21 ng/mL (ref 10–291)

## 2013-07-12 LAB — VITAMIN B12: Vitamin B-12: 854 pg/mL (ref 211–911)

## 2013-07-12 LAB — FOLATE: Folate: >20 ng/mL

## 2013-07-12 LAB — MAGNESIUM: MAGNESIUM: 2.1 mg/dL (ref 1.5–2.5)

## 2013-07-12 LAB — RETICULOCYTES
RBC.: 3.74 MIL/uL — ABNORMAL LOW (ref 3.87–5.11)
Retic Count, Absolute: 63.6 K/uL (ref 19.0–186.0)
Retic Ct Pct: 1.7 % (ref 0.4–3.1)

## 2013-07-12 MED ORDER — POLYETHYLENE GLYCOL 3350 17 G PO PACK: 17.0000 g | PACK | Freq: Two times a day (BID) | ORAL | Status: DC

## 2013-07-12 MED ORDER — METRONIDAZOLE 500 MG PO TABS
500.0000 mg | ORAL_TABLET | Freq: Three times a day (TID) | ORAL | Status: DC
  Administered 2013-07-12: 500 mg via ORAL
  Filled 2013-07-12: qty 1

## 2013-07-12 MED ORDER — LINACLOTIDE 290 MCG PO CAPS: 290.0000 ug | ORAL_CAPSULE | Freq: Every day | ORAL | Status: DC

## 2013-07-12 MED ORDER — CIPROFLOXACIN HCL 250 MG PO TABS
500.0000 mg | ORAL_TABLET | Freq: Two times a day (BID) | ORAL | Status: DC
  Administered 2013-07-12: 500 mg via ORAL
  Filled 2013-07-12: qty 2

## 2013-07-12 MED ORDER — POLYETHYLENE GLYCOL 3350 17 G PO PACK
17.0000 g | PACK | Freq: Two times a day (BID) | ORAL | Status: DC
  Administered 2013-07-12: 17 g via ORAL
  Filled 2013-07-12: qty 1

## 2013-07-12 MED ORDER — CIPROFLOXACIN HCL 500 MG PO TABS: 500.0000 mg | ORAL_TABLET | Freq: Two times a day (BID) | ORAL | Status: DC

## 2013-07-12 MED ORDER — METRONIDAZOLE 500 MG PO TABS: 500.0000 mg | ORAL_TABLET | Freq: Three times a day (TID) | ORAL | Status: DC

## 2013-07-12 NOTE — Progress Notes (Signed)
6/10/815 1212 Reviewed discharge instructions with patient. Given copy of AVS, prescriptions, f/u appointment information. Dr. Oneida Alar office to call patient to schedule appointment and arrange colonoscopy/endoscopy if needed. Reviewed medication list, noted when home medications next due. Reviewed colitis education sheet, s/s, when to seek medical attention. Verbalized understanding via teachback. IV site d/c'd per nurse tech, within normal limits. Pt in stable condition awaiting son arrival for discharge home. Donavan Foil, RN

## 2013-07-12 NOTE — Telephone Encounter (Signed)
Please arrange outpatient OV with me in about 4 weeks to discuss colonoscopy/EGD/ED.

## 2013-07-12 NOTE — Discharge Summary (Signed)
Physician Discharge Summary  KORRINE SICARD YOV:[id number] DOB: Jul 25, 1945 DOA: 07/09/2013  PCP: Abigail Miyamoto, MD  Admit date: 07/09/2013 Discharge date: 07/12/2013  Time spent: 35 minutes  Recommendations for Outpatient Follow-up:  1. Recommend getting your blood count checked in another week at your regular physician's office 2. Followup with gastroenterology for colonoscopy endoscopy plus dilation 3. Please get the prescriptions filled for your constipation 4. Complete course of Cipro and Flagyl 6/12 although the likelihood of infectious etiology  5. Consider down titration of opiate medications   Discharge Diagnoses:  Principal Problem:   Colitis Active Problems:   Hypothyroid   Asthma   Unspecified constipation   Chronic pain syndrome   Depression   Discharge Condition: Fair  Diet recommendation: Heart healthy  Filed Weights   07/09/13 1735 07/09/13 2300 07/10/13 0538  Weight: 67.841 kg (149 lb 9 oz) 67.994 kg (149 lb 14.4 oz) 67.994 kg (149 lb 14.4 oz)    History of present illness:  68 year old female history of chronic constipation admitted with no bowel movements for 1 week and increasing difficulty going to bathroom when she had to manually disimpact herself. Increased abdominal pain distention of milk of magnesia without effect no noted blood in stools. Was eating sunflower seeds to attempt to get her bowels to move  Hospital Course:   #1 constipation/obstipation  Likely narcotic induced. Patient's bowels moving well since addition of Linzess Continue bowel regimen of MiraLAX  Diet has been advanced.  GI Will schedule followup appointment for her in the outpatient setting   #2 colitis  Differential could include diverticulitis versus an ischemic colitis versus secondary to problem #1 These etiologies are felt to be less likely and more in keeping with CT over read Transitioned to a regular diet on discharge and kept on by mouth antibiotics until  07/14/48  #3 hypothyroidism  TSH within normal limits at 1.640. Continue Synthroid.   #4 asthma  Stable.   #5 chronic pain  Continue pain medications. Patient has been started on a bowel regimen.  Will need to down-titrate the medications as OP.   #6 depression  Stable.    Consultants:  Gastroenterology: Dr. Gala Romney 07/10/2013 Procedures:  CT abdomen and pelvis 07/09/2013 Antibiotics:  IV Cipro 07/09/2013-->orals till 6/12  IV Flagyl 07/09/2013-->0rals till 6/12    Discharge Exam: Filed Vitals:   07/12/13 0509  BP: 107/63  Pulse: 72  Temp: 97.9 F (36.6 C)  Resp: 20    General: Alert pleasant oriented no apparent distress Cardiovascular: S1-S2 no murmur rub or gallop Respiratory: Clinically clear  Discharge Instructions You were cared for by a hospitalist during your hospital stay. If you have any questions about your discharge medications or the care you received while you were in the hospital after you are discharged, you can call the unit and asked to speak with the hospitalist on call if the hospitalist that took care of you is not available. Once you are discharged, your primary care physician will handle any further medical issues. Please note that NO REFILLS for any discharge medications will be authorized once you are discharged, as it is imperative that you return to your primary care physician (or establish a relationship with a primary care physician if you do not have one) for your aftercare needs so that they can reassess your need for medications and monitor your lab values.  Discharge Instructions   Diet - low sodium heart healthy    Complete by:  As directed  Discharge instructions    Complete by:  As directed   Complete course of antibiotics in 2 days-both ciprofloxacin and Flagyl We have prescribed for you 2 medications for chronic constipation-they are MiraLax as well as Linzess.  We will give you a prescription for them so that you can mail them to  your pharmacy Please follow up with gastroenterology for colonoscopy and endoscopy Your blood count was in the low this admission and this may be from either losses from your GI tract or other causes and this will need to be followed     Increase activity slowly    Complete by:  As directed             Medication List    STOP taking these medications       cyclobenzaprine 10 MG tablet  Commonly known as:  FLEXERIL     fluticasone 50 MCG/ACT nasal spray  Commonly known as:  FLONASE      TAKE these medications       acetaminophen 500 MG tablet  Commonly known as:  TYLENOL  Take 500 mg by mouth every 6 (six) hours as needed. For pain     ciprofloxacin 500 MG tablet  Commonly known as:  CIPRO  Take 1 tablet (500 mg total) by mouth 2 (two) times daily.     furosemide 40 MG tablet  Commonly known as:  LASIX  Take 40 mg by mouth daily.     HYDROcodone-acetaminophen 10-325 MG per tablet  Commonly known as:  NORCO  Take 1 tablet by mouth every 4 (four) hours as needed. pain     lamoTRIgine 100 MG tablet  Commonly known as:  LAMICTAL  Take 150 mg by mouth 2 (two) times daily.     latanoprost 0.005 % ophthalmic solution  Commonly known as:  XALATAN  Place 1 drop into both eyes at bedtime.     levothyroxine 50 MCG tablet  Commonly known as:  SYNTHROID, LEVOTHROID  Take 50 mcg by mouth daily.     Linaclotide 290 MCG Caps capsule  Commonly known as:  LINZESS  Take 1 capsule (290 mcg total) by mouth daily.     metoprolol succinate 50 MG 24 hr tablet  Commonly known as:  TOPROL-XL  Take 50 mg by mouth daily.     metroNIDAZOLE 500 MG tablet  Commonly known as:  FLAGYL  Take 1 tablet (500 mg total) by mouth every 8 (eight) hours.     montelukast 10 MG tablet  Commonly known as:  SINGULAIR  Take 10 mg by mouth daily.     morphine 30 MG 12 hr tablet  Commonly known as:  MS CONTIN  Take 30 mg by mouth every 12 (twelve) hours.     omeprazole 20 MG capsule  Commonly  known as:  PRILOSEC  Take 20 mg by mouth daily.     polyethylene glycol packet  Commonly known as:  MIRALAX / GLYCOLAX  Take 17 g by mouth 2 (two) times daily.     PRESERVISION/LUTEIN Caps  Take 1 capsule by mouth daily.     QUEtiapine 50 MG tablet  Commonly known as:  SEROQUEL  Take 50 mg by mouth at bedtime.     SALONPAS 1.2-5.7-6.3 % Ptch  Generic drug:  Camphor-Menthol-Methyl Sal  Apply 1 patch topically every 8 (eight) hours as needed. For pain     VENTOLIN HFA 108 (90 BASE) MCG/ACT inhaler  Generic drug:  albuterol  Inhale 2 puffs  into the lungs every 6 (six) hours as needed. For shortness of breath       Allergies  Allergen Reactions  . Sulfa Antibiotics Rash      The results of significant diagnostics from this hospitalization (including imaging, microbiology, ancillary and laboratory) are listed below for reference.    Significant Diagnostic Studies: Ct Abdomen Pelvis W Contrast  07/09/2013   CLINICAL DATA:  Constipation, abdominal pain, and nausea for 1 day. Left lower quadrant pain. Ate a lot of sunflower seeds.  EXAM: CT ABDOMEN AND PELVIS WITH CONTRAST  TECHNIQUE: Multidetector CT imaging of the abdomen and pelvis was performed using the standard protocol following bolus administration of intravenous contrast.  CONTRAST:  22mL OMNIPAQUE IOHEXOL 300 MG/ML SOLN, 1102mL OMNIPAQUE IOHEXOL 300 MG/ML SOLN  COMPARISON:  None.  FINDINGS: Mild atelectasis in the right lung base. Large esophageal hiatal hernia.  The gallbladder is moderately distended without wall thickening. No stones or ductal dilatation are demonstrated. There is pneumobilia present. In the absence of prior gallbladder surgery or sphincterotomy, this could indicate biliary infection or fistula. Diffuse fatty infiltration of the pancreas. No focal liver lesions. The spleen, adrenal glands, kidneys, inferior vena cava, and retroperitoneal lymph nodes are unremarkable. Calcification of aorta without aneurysm.  Stomach and small bowel are not distended and no wall thickening is appreciated. No free air or free fluid in the abdomen. Abdominal wall musculature appears intact.  Pelvis: The colon is diffusely filled with stool throughout. There is mild wall thickening in the sigmoid and descending region which may indicate colitis. No specific focal evidence of diverticulitis. Changes are most consistent with colitis. No pneumatosis. No portal venous gas. Uterus and ovaries are not enlarged. Bladder wall is not thickened. No free or loculated pelvic fluid collections. No pelvic mass or lymphadenopathy. No destructive bone lesions.  IMPRESSION: Diffusely stool-filled and mildly prominent colon with wall thickening in the descending and sigmoid region suggesting colitis and constipation. Distended gallbladder without specific inflammatory signs. Pneumobilia in the intrahepatic bile ducts. In the absence of surgery or sphincterotomy, this could indicate infection or occult fistula.   Electronically Signed   By: Lucienne Capers M.D.   On: 07/09/2013 21:24    Microbiology: No results found for this or any previous visit (from the past 240 hour(s)).   Labs: Basic Metabolic Panel:  Recent Labs Lab 07/09/13 1955 07/10/13 0717 07/11/13 0619 07/12/13 0601  NA 131* 135* 135* 141  K 4.0 3.9 4.1 4.2  CL 87* 92* 97 104  CO2 31 30 27 27   GLUCOSE 120* 96 108* 94  BUN 14 11 10 8   CREATININE 0.82 0.69 0.64 0.71  CALCIUM 8.4 8.3* 7.9* 8.2*  MG  --   --  3.2* 2.1   Liver Function Tests:  Recent Labs Lab 07/09/13 1955 07/10/13 0717  AST 27 21  ALT 18 16  ALKPHOS 111 119*  BILITOT 0.4 0.3  PROT 7.1 6.7  ALBUMIN 3.4* 3.1*   No results found for this basename: LIPASE, AMYLASE,  in the last 168 hours No results found for this basename: AMMONIA,  in the last 168 hours CBC:  Recent Labs Lab 07/09/13 1955 07/10/13 0717 07/11/13 0619 07/12/13 0601  WBC 14.5* 11.6* 9.5 5.8  NEUTROABS 10.5*  --   --   --    HGB 10.6* 10.4* 9.1* 8.5*  HCT 33.7* 32.7* 29.1* 27.0*  MCV 77.6* 77.7* 77.2* 78.0  PLT 394 394 369 318   Cardiac Enzymes: No results found for  this basename: CKTOTAL, CKMB, CKMBINDEX, TROPONINI,  in the last 168 hours BNP: BNP (last 3 results) No results found for this basename: PROBNP,  in the last 8760 hours CBG:  Recent Labs Lab 07/10/13 0735 07/11/13 0727 07/12/13 0817  GLUCAP 77 104* 92       Signed:  Nita Sells  [facility] 07/12/2013, 11:39 AM

## 2013-07-12 NOTE — Progress Notes (Signed)
07/12/13 1306 Patient left floor in stable condition via w/c accompanied by nurse tech. Discharged home. Donavan Foil, RN

## 2013-07-12 NOTE — Discharge Instructions (Signed)
Colitis °Colitis is inflammation of the colon. Colitis can be a short-term or long-standing (chronic) illness. Crohn's disease and ulcerative colitis are 2 types of colitis which are chronic. They usually require lifelong treatment. °CAUSES  °There are many different causes of colitis, including: °· Viruses. °· Germs (bacteria). °· Medicine reactions. °SYMPTOMS  °· Diarrhea. °· Intestinal bleeding. °· Pain. °· Fever. °· Throwing up (vomiting). °· Tiredness (fatigue). °· Weight loss. °· Bowel blockage. °DIAGNOSIS  °The diagnosis of colitis is based on examination and stool or blood tests. X-rays, CT scan, and colonoscopy may also be needed. °TREATMENT  °Treatment may include: °· Fluids given through the vein (intravenously). °· Bowel rest (nothing to eat or drink for a period of time). °· Medicine for pain and diarrhea. °· Medicines (antibiotics) that kill germs. °· Cortisone medicines. °· Surgery. °HOME CARE INSTRUCTIONS  °· Get plenty of rest. °· Drink enough water and fluids to keep your urine clear or pale yellow. °· Eat a well-balanced diet. °· Call your caregiver for follow-up as recommended. °SEEK IMMEDIATE MEDICAL CARE IF:  °· You develop chills. °· You have an oral temperature above 102° F (38.9° C), not controlled by medicine. °· You have extreme weakness, fainting, or dehydration. °· You have repeated vomiting. °· You develop severe belly (abdominal) pain or are passing bloody or tarry stools. °MAKE SURE YOU:  °· Understand these instructions. °· Will watch your condition. °· Will get help right away if you are not doing well or get worse. °Document Released: 02/27/2004 Document Revised: 04/13/2011 Document Reviewed: 05/24/2009 °[employer]® Patient Information ©2014 [employer]. ° °

## 2013-07-12 NOTE — Progress Notes (Signed)
    Subjective: 7 bowel movements yesterday, soft, "almost diarrhea". Abdominal discomfort significantly improved. No nausea. Wants to advance diet. Eager to go home.   Objective: Vital signs in last 24 hours: Temp:  [97.9 F (36.6 C)-98.2 F (36.8 C)] 97.9 F (36.6 C) (06/10 0509) Pulse Rate:  [68-86] 72 (06/10 0509) Resp:  [20] 20 (06/10 0509) BP: (96-117)/(46-70) 107/63 mmHg (06/10 0509) SpO2:  [93 %-95 %] 95 % (06/10 0509) Last BM Date: 07/11/13 (states had 7 stools yesterday) General:   Alert and oriented, pleasant Head:  Normocephalic and atraumatic. Abdomen:  Bowel sounds present, soft, non-tender, non-distended. No HSM or hernias noted. No rebound or guarding. No masses appreciated  Msk:  Symmetrical without gross deformities. Normal posture. Extremities:  Without  edema. Neurologic:  Alert and  oriented x4;  grossly normal neurologically. Psych:  Alert and cooperative. Normal mood and affect.  Intake/Output from previous day: 06/09 0701 - 06/10 0700 In: 1480 [P.O.:480; I.V.:600; IV Piggyback:400] Out: 700 [Urine:700] Intake/Output this shift: Total I/O In: -  Out: 600 [Urine:600]  Lab Results:  Recent Labs  07/10/13 0717 07/11/13 0619 07/12/13 0601  WBC 11.6* 9.5 5.8  HGB 10.4* 9.1* 8.5*  HCT 32.7* 29.1* 27.0*  PLT 394 369 318   BMET  Recent Labs  07/10/13 0717 07/11/13 0619 07/12/13 0601  NA 135* 135* 141  K 3.9 4.1 4.2  CL 92* 97 104  CO2 30 27 27   GLUCOSE 96 108* 94  BUN 11 10 8   CREATININE 0.69 0.64 0.71  CALCIUM 8.3* 7.9* 8.2*   LFT  Recent Labs  07/09/13 1955 07/10/13 0717  PROT 7.1 6.7  ALBUMIN 3.4* 3.1*  AST 27 21  ALT 18 16  ALKPHOS 111 119*  BILITOT 0.4 0.3   Assessment: 68 year old female admitted with constipation/obstipation in the setting of chronic narcotics, with question of colitis which is non-specific. May actually be secondary to large stool load creating a pressure effect. After milk and molasses enema, SMOG  enema, double dose Miralax BID and Linzess 290 mcg daily, she has had 7 soft stools yesterday. Clinically improved. Will monitor for any diarrhea, decrease Miralax to 17 g BID and continue Linzess 290 mcg daily.    Anemia: unclear baseline. Follow-up as outpatient. Drift in Hgb to 8.5 from 10 range on admission. No overt signs of GI bleeding. Likely multifactorial. Will check anemia panel and pursue colonoscopy/EGD as outpatient.   Esophageal dysphagia: chronic. Pursue EGD as outpatient at time of colonoscopy.   Plan: Decrease Miralax to 17 g BID Linzess 290 mcg daily Anemia panel Advance diet to low fat Empiric Cipro/Flagyl for total of 7 days, convert to oral now Colonoscopy/EGD with dilation as outpatient, our office to arrange Hopeful discharge today   [name], ANP-BC [facility]     LOS: 3 days    07/12/2013, 8:57 AM

## 2013-07-16 ENCOUNTER — Encounter: Payer: Self-pay | Admitting: Gastroenterology

## 2013-07-16 NOTE — Telephone Encounter (Signed)
Pt is aware of OV On 7/16 at 0930 with AS and appt card mailed

## 2013-07-28 ENCOUNTER — Telehealth: Payer: Self-pay | Admitting: Internal Medicine

## 2013-07-28 NOTE — Telephone Encounter (Signed)
There is no mention of samples in any of the hospital notes. Routing to AS

## 2013-07-28 NOTE — Telephone Encounter (Signed)
Pt called today asking about getting some samples of LInzess and if we would call in her Rx of Miralax to [facility] in [place]. She said that she was in the hospital about 2 weeks ago and RMR told her that we would have some samples for her. [phone]

## 2013-07-31 MED ORDER — LINACLOTIDE 290 MCG PO CAPS: 290.0000 ug | ORAL_CAPSULE | Freq: Every day | ORAL | Status: DC

## 2013-07-31 MED ORDER — POLYETHYLENE GLYCOL 3350 17 G PO PACK: 17.0000 g | PACK | Freq: Two times a day (BID) | ORAL | Status: DC

## 2013-07-31 NOTE — Telephone Encounter (Signed)
I sent in Miralax. May offer the Linzess 290 mcg voucher. I have  sent a prescription to her pharmacy so that she may use a voucher for a month supply free. This will be more cost effective and save samples for future use.  [name], ANP-BC [facility]

## 2013-08-02 NOTE — Telephone Encounter (Signed)
Pt is aware. Voucher at the front desk. Discussed patient assistance with the pt because she said she may not be able to afford it. Left copies of patient assistance paperwork at the front desk as well.

## 2013-08-14 ENCOUNTER — Telehealth: Payer: Self-pay | Admitting: *Deleted

## 2013-08-14 NOTE — Telephone Encounter (Signed)
Pt said she is doing a mail order, pt said she needs her RX at the [facility]. Per pt no one has told her they faxed it anywhere.

## 2013-08-14 NOTE — Telephone Encounter (Signed)
rx was sent to [facility]. Please call her and find out if she is going to get it there or do we need to send it somewhere else?

## 2013-08-14 NOTE — Telephone Encounter (Signed)
Pt needs her Linzess refilled at the [facility] in [place], per pt she does not have it. Please advise

## 2013-08-17 ENCOUNTER — Other Ambulatory Visit: Payer: Self-pay | Admitting: Internal Medicine

## 2013-08-17 ENCOUNTER — Encounter: Payer: Self-pay | Admitting: Gastroenterology

## 2013-08-17 ENCOUNTER — Ambulatory Visit (INDEPENDENT_AMBULATORY_CARE_PROVIDER_SITE_OTHER): Payer: Medicare HMO | Admitting: Gastroenterology

## 2013-08-17 ENCOUNTER — Encounter (INDEPENDENT_AMBULATORY_CARE_PROVIDER_SITE_OTHER): Payer: Self-pay

## 2013-08-17 VITALS — BP 145/80 | HR 94 | Temp 97.0°F | Ht 60.0 in | Wt 140.6 lb

## 2013-08-17 DIAGNOSIS — D509 Iron deficiency anemia, unspecified: Secondary | ICD-10-CM

## 2013-08-17 DIAGNOSIS — K222 Esophageal obstruction: Secondary | ICD-10-CM | POA: Insufficient documentation

## 2013-08-17 DIAGNOSIS — K5900 Constipation, unspecified: Secondary | ICD-10-CM

## 2013-08-17 DIAGNOSIS — R1310 Dysphagia, unspecified: Secondary | ICD-10-CM

## 2013-08-17 DIAGNOSIS — Z1211 Encounter for screening for malignant neoplasm of colon: Secondary | ICD-10-CM | POA: Insufficient documentation

## 2013-08-17 DIAGNOSIS — D649 Anemia, unspecified: Secondary | ICD-10-CM | POA: Insufficient documentation

## 2013-08-17 MED ORDER — LINACLOTIDE 290 MCG PO CAPS: 290.0000 ug | ORAL_CAPSULE | Freq: Every day | ORAL | Status: DC

## 2013-08-17 NOTE — Progress Notes (Signed)
cc'd to pcp 

## 2013-08-17 NOTE — Assessment & Plan Note (Signed)
With unknown baseline. Likely multifactorial, with low iron, low normal ferritin. Unknown hemoccult status. TCS/EGD as planned. Obtain most recent blood work. May need capsule if no evidence for anemia on TCS/EGD.

## 2013-08-17 NOTE — Assessment & Plan Note (Signed)
Chronic without prior EGD. Prilosec daily. Intermittent vague reports of nausea. Needs EGD with dilation at time of colonoscopy due to persistent symptoms. Query web, ring, stricture, doubt malignancy. Nausea likely multifactorial in setting of constipation, narcotics, possible gastritis component. Could have element of delayed gastric emptying. EGD to be scheduled.   Proceed with upper endoscopy/dilation in the near future with Dr. Gala Romney. The risks, benefits, and alternatives have been discussed in detail with patient. They have stated understanding and desire to proceed.  PROPOFOL due to polypharmacy.

## 2013-08-17 NOTE — Assessment & Plan Note (Addendum)
68 year old female with need for routine screening colonoscopy, last performed in 2005. Recent hospital admission with non-specific colitis in the setting of constipation/obstipation, which could have been secondary to large stool load creating pressure effect. 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   Proceed with TCS with Dr. Gala Romney in near future: the risks, benefits, and alternatives have been discussed with the patient in detail. The patient states understanding and desires to proceed. PROPOFOL DUE TO POLYPHARMACY Linzess 290 mcg daily 2 days of clear liquids prior to help with preparation Split prep dosing due to nausea, patient's concern for tolerating prep

## 2013-08-17 NOTE — Assessment & Plan Note (Signed)
In setting of chronic narcotics. Linzeses 290 mcg daily continued.

## 2013-08-17 NOTE — Progress Notes (Signed)
Referring Provider: Abigail Miyamoto, MD Primary Care Physician:  Abigail Miyamoto, MD Primary GI: Dr. Gala Romney   Chief Complaint  Patient presents with  . DISCUSS TCS AND EGD    HPI:   68 year old female presents today in hospital follow-up from June 2015, admitted with constipation/obstipation in the setting of chronic narcotics, with question of colitis which is non-specific. May actually be secondary to large stool load creating a pressure effect. After milk and molasses enema, SMOG enema, double dose Miralax BID and Linzess 290 mcg daily, she had good results.   Anemia: unclear baseline.  Drift in Hgb to 8.5 from 10 range during hospital admission. No overt signs of GI bleeding. Esophageal dysphagia: chronic.   No Linzess in 4 days. Had issues with pharmacy. Had a "good BM for me" a few days ago. Had to use finger to push down stool. Has to put one finger in vagina and one in rectum. No hematochezia or melena. Has felt nauseated. Chronic solid food dysphagia. Has sour stomach. Prilosec daily.   Past Medical History  Diagnosis Date  . Fibromyalgia   . Depression   . Thyroid disease   . Back pain   . Asthma   . Chronic pain syndrome 07/10/2013  . Duodenal papillary stenosis   . Macular degeneration   . Glaucoma     Past Surgical History  Procedure Laterality Date  . Tonsillectomy    . Hemorroidectomy    . Hernia repair    . Coccyx removal    . Colonoscopy  2005    Dr. Laural Golden: mild melanosis coli, otherwise normal  . Ercp  1997    [facility]: biliary manometry abnormal, subsequent sphincterotomy   . Foot surgery      X2    Current Outpatient Prescriptions  Medication Sig Dispense Refill  . acetaminophen (TYLENOL) 500 MG tablet Take 500 mg by mouth every 6 (six) hours as needed. For pain       . albuterol (VENTOLIN HFA) 108 (90 BASE) MCG/ACT inhaler Inhale 2 puffs into the lungs every 6 (six) hours as needed. For shortness of breath       . cyclobenzaprine (FLEXERIL) 10 MG  tablet Take 10 mg by mouth at bedtime.      . furosemide (LASIX) 40 MG tablet Take 40 mg by mouth daily.      [name] HYDROcodone-acetaminophen (NORCO) 10-325 MG per tablet Take 1 tablet by mouth every 4 (four) hours as needed. pain      . lamoTRIgine (LAMICTAL) 100 MG tablet Take 150 mg by mouth 2 (two) times daily.       [name] latanoprost (XALATAN) 0.005 % ophthalmic solution Place 1 drop into both eyes at bedtime.      [name] levothyroxine (SYNTHROID, LEVOTHROID) 50 MCG tablet Take 50 mcg by mouth daily.        . Linaclotide (LINZESS) 290 MCG CAPS capsule Take 1 capsule (290 mcg total) by mouth daily. Take 30 minutes before breakfast daily.  30 capsule  3  . metoprolol succinate (TOPROL-XL) 50 MG 24 hr tablet Take 50 mg by mouth daily.      . montelukast (SINGULAIR) 10 MG tablet Take 10 mg by mouth daily.      [name] morphine (MS CONTIN) 30 MG 12 hr tablet Take 30 mg by mouth every 12 (twelve) hours.      . NON FORMULARY PRESERVISION   ONE TABLET DAILY      . NON FORMULARY IRON  NOT SURE STRENGTH      . omeprazole (PRILOSEC) 20 MG capsule Take 20 mg by mouth daily.      . polyethylene glycol (MIRALAX / GLYCOLAX) packet Take 17 g by mouth 2 (two) times daily.  60 each  5  . QUEtiapine (SEROQUEL) 50 MG tablet Take 50 mg by mouth at bedtime.       No current facility-administered medications for this visit.    Allergies as of 08/17/2013 - Review Complete 08/17/2013  Allergen Reaction Noted  . Sulfa antibiotics Rash 08/26/2010    Family History  Problem Relation Age of Onset  . Colon cancer Neg Hx   . Lung cancer Mother   . Heart attack Father   . Bone cancer Brother   . COPD Sister     History   Social History  . Marital Status: Divorced    Spouse Name: N/A    Number of Children: N/A  . Years of Education: N/A   Social History Main Topics  . Smoking status: Never Smoker   . Smokeless tobacco: None     Comment: NEVER SMOKED  . Alcohol Use: No  . Drug Use: No  . Sexual Activity: None    Other Topics Concern  . None   Social History Narrative  . None    Review of Systems: As mentioned in HPI  Physical Exam: BP 145/80  Pulse 94  Temp(Src) 97 F (36.1 C) (Oral)  Ht 5' (1.524 m)  Wt 140 lb 9.6 oz (63.776 kg)  BMI 27.46 kg/m2 General:   Alert and oriented. No distress noted. Pleasant and cooperative.  Head:  Normocephalic and atraumatic. Eyes:  Conjuctiva clear without scleral icterus. Mouth:  Oral mucosa pink and moist. Good dentition. No lesions. Heart:  S1, S2 present without murmurs, rubs, or gallops. Regular rate and rhythm. Abdomen:  +BS, soft, non-tender and non-distended. No rebound or guarding. No HSM or masses noted. Msk:  Symmetrical without gross deformities. Normal posture. Extremities:  Without edema. Neurologic:  Alert and  oriented x4;  grossly normal neurologically. Skin:  Intact without significant lesions or rashes. Psych:  Alert and cooperative. Normal mood and affect.  Lab Results  Component Value Date   WBC 5.8 07/12/2013   HGB 8.5* 07/12/2013   HCT 27.0* 07/12/2013   MCV 78.0 07/12/2013   PLT 318 07/12/2013   Lab Results  Component Value Date   IRON 18* 07/12/2013   TIBC 252 07/12/2013   FERRITIN 21 07/12/2013

## 2013-08-17 NOTE — Patient Instructions (Signed)
I have provided the Linzess prescription to take to the pharmacy.   We have scheduled you for a colonoscopy, upper endoscopy, and dilation with Dr. Gala Romney in the near future.

## 2013-09-01 ENCOUNTER — Encounter (HOSPITAL_COMMUNITY): Payer: Self-pay | Admitting: Pharmacy Technician

## 2013-09-06 NOTE — Patient Instructions (Signed)
Heidi Fuentes  09/06/2013   Your procedure is scheduled on:   09/14/2013  Report to [facility] at  51  AM.  Call this number if you have problems the morning of surgery: [phone]   Remember:   Do not eat food or drink liquids after midnight.   Take these medicines the morning of surgery with A SIP OF WATER:  Flexaril, hydrocodone, levothyroxine, metoprolol, singulair, morphine, prilosec. Take yourinhaler before you come.   Do not wear jewelry, make-up or nail polish.  Do not wear lotions, powders, or perfumes.   Do not shave 48 hours prior to surgery. Men may shave face and neck.  Do not bring valuables to the hospital.  [facility] is not responsible for any belongings or valuables.               Contacts, dentures or bridgework may not be worn into surgery.  Leave suitcase in the car. After surgery it may be brought to your room.  For patients admitted to the hospital, discharge time is determined by your treatment team.               Patients discharged the day of surgery will not be allowed to drive home.  Name and phone number of your driver: family  Special Instructions: N/A   Please read over the following fact sheets that you were given: Pain Booklet, Coughing and Deep Breathing, Surgical Site Infection Prevention, Anesthesia Post-op Instructions and Care and Recovery After Surgery Esophageal Dilatation The esophagus is the long, narrow tube which carries food and liquid from the mouth to the stomach. Esophageal dilatation is the technique used to stretch a blocked or narrowed portion of the esophagus. This procedure is used when a part of the esophagus has become so narrow that it becomes difficult, painful or even impossible to swallow. This is generally an uncomplicated form of treatment. When this is not successful, chest surgery may be required. This is a much more extensive form of treatment with a longer recovery time. CAUSES  Some of the more common  causes of blockage or strictures of the esophagus are:  Narrowing from longstanding inflammation (soreness and redness) of the lower esophagus. This comes from the constant exposure of the lower esophagus to the acid which bubbles up from the stomach. Over time this causes scarring and narrowing of the lower esophagus.  Hiatal hernia in which a small part of the stomach bulges (herniates) up through the diaphragm. This can cause a gradual narrowing of the end of the esophagus.  Schatzki ring is a narrow ring of benign (non-cancerous) fibrous tissue which constricts the lower esophagus. The reason for this is not known.  Scleroderma is a connective tissue disorder that affects the esophagus and makes swallowing difficult.  Achalasia is an absence of nerves to the lower esophagus and to the esophageal sphincter. This is the circular muscle between the stomach and esophagus that relaxes to allow food into the stomach. After swallowing, it contracts to keep food in the stomach. This absence of nerves may be congenital (present since birth). This can cause irregular spasms of the lower esophageal muscle. This spasm does not open up to allow food and fluid through. The result is a persistent blockage with subsequent slow trickling of the esophageal contents into the stomach.  Strictures may develop from swallowing materials which damage the esophagus. Some examples are strong acids or alkalis such as lye.  Growths such as benign (non-cancerous) and malignant (cancerous) tumors can block the esophagus.  Hereditary (present since birth) causes. DIAGNOSIS  Your caregiver often suspects this problem by taking a medical history. They will also do a physical exam. They can then prove their suspicions using X-rays and endoscopy. Endoscopy is an exam in which a tube like a small, flexible telescope is used to look at your esophagus.  TREATMENT There are different stretching (dilating) techniques that can be  used. Simple bougie dilatation may be done in the office. This usually takes only a couple minutes. A numbing (anesthetic) spray of the throat is used. Endoscopy, when done, is done in an endoscopy suite under mild sedation. When fluoroscopy is used, the procedure is performed in X-ray. Other techniques require a little longer time. Recovery is usually quick. There is no waiting time to begin eating and drinking to test success of the treatment. Following are some of the methods used. Narrowing of the esophagus is treated by making it bigger. Commonly this is a mechanical problem which can be treated with stretching. This can be done in different ways. Your caregiver will discuss these with you. Some of the means used are:  A series of graduated (increasing thickness) flexible dilators can be used. These are weighted tubes passed through the esophagus into the stomach. The tubes used become progressively larger until the desired stretched size is reached. Graduated dilators are a simple and quick way of opening the esophagus. No visualization is required.  Another method is the use of endoscopy to place a flexible wire across the stricture. The endoscope is removed and the wire left in place. A dilator with a hole through it from end to end is guided down the esophagus and across the stricture. One or more of these dilators are passed over the wire. At the end of the exam, the wire is removed. This type of treatment may be performed in the X-ray department under fluoroscopy. An advantage of this procedure is the examiner is visualizing the end opening in the esophagus.  Stretching of the esophagus may be done using balloons. Deflated balloons are placed through the endoscope and across the stricture. This type of balloon dilatation is often done at the time of endoscopy or fluoroscopy. Flexible endoscopy allows the examiner to directly view the stricture. A balloon is inserted in the deflated form into the  area of narrowing. It is then inflated with air to a certain pressure that is preset for a given circumference. When inflated, it becomes sausage shaped, stretched, and makes the stricture larger.  Achalasia requires a longer, larger balloon-type dilator. This is frequently done under X-ray control. In this situation, the spastic muscle fibers in the lower esophagus are stretched. All of the above procedures make the passage of food and water into the stomach easier. They also make it easier for stomach contents to reflux back into the esophagus. Special medications may be used following the procedure to help prevent further stricturing. Proton-pump inhibitor medications are good at decreasing the amount of acid in the stomach juice. When stomach juice refluxes into the esophagus, the juice is no longer as acidic and is less likely to burn or scar the esophagus. RISKS AND COMPLICATIONS Esophageal dilatation is usually performed effectively and without problems. Some complications that can occur are:  A small amount of bleeding almost always happens where the stretching takes place. If this is too excessive it may require more aggressive treatment.  An uncommon complication is  perforation (making a hole) of the esophagus. The esophagus is thin. It is easy to make a hole in it. If this happens, an operation may be necessary to repair this.  A small, undetected perforation could lead to an infection in the chest. This can be very serious. HOME CARE INSTRUCTIONS   If you received sedation for your procedure, do not drive, make important decisions, or perform any activities requiring your full coordination. Do not drink alcohol, take sedatives, or use any mind altering chemicals unless instructed by your caregiver.  You may use throat lozenges or warm salt water gargles if you have throat discomfort.  You can begin eating and drinking normally on return home unless instructed otherwise. Do not purposely  try to force large chunks of food down to test the benefits of your procedure.  Mild discomfort can be eased with sips of ice water.  Medications for discomfort may or may not be needed. SEEK IMMEDIATE MEDICAL CARE IF:   You begin vomiting up blood.  You develop black, tarry stools.  You develop chills or an unexplained temperature of over 101F (38.3C)  You develop chest or abdominal pain.  You develop shortness of breath, or feel light-headed or faint.  Your swallowing is becoming more painful, difficult, or you are unable to swallow. MAKE SURE YOU:   Understand these instructions.  Will watch your condition.  Will get help right away if you are not doing well or get worse. Document Released: 03/12/2005 Document Revised: 06/05/2013 Document Reviewed: 04/29/2005 [facility] Patient Information 2015 [place], [place]. This information is not intended to replace advice given to you by your health care provider. Make sure you discuss any questions you have with your health care provider. Esophagogastroduodenoscopy Esophagogastroduodenoscopy (EGD) is a procedure to examine the lining of the esophagus, stomach, and first part of the small intestine (duodenum). A long, flexible, lighted tube with a camera attached (endoscope) is inserted down the throat to view these organs. This procedure is done to detect problems or abnormalities, such as inflammation, bleeding, ulcers, or growths, in order to treat them. The procedure lasts about 5-20 minutes. It is usually an outpatient procedure, but it may need to be performed in emergency cases in the hospital. LET YOUR CAREGIVER KNOW ABOUT:   Allergies to food or medicine.  All medicines you are taking, including vitamins, herbs, eyedrops, and over-the-counter medicines and creams.  Use of steroids (by mouth or creams).  Previous problems you or members of your family have had with the use of anesthetics.  Any blood disorders you  have.  Previous surgeries you have had.  Other health problems you have.  Possibility of pregnancy, if this applies. RISKS AND COMPLICATIONS  Generally, EGD is a safe procedure. However, as with any procedure, complications can occur. Possible complications include:  Infection.  Bleeding.  Tearing (perforation) of the esophagus, stomach, or duodenum.  Difficulty breathing or not being able to breath.  Excessive sweating.  Spasms of the larynx.  Slowed heartbeat.  Low blood pressure. BEFORE THE PROCEDURE  Do not eat or drink anything for 6-8 hours before the procedure or as directed by your caregiver.  Ask your caregiver about changing or stopping your regular medicines.  If you wear dentures, be prepared to remove them before the procedure.  Arrange for someone to drive you home after the procedure. PROCEDURE   A vein will be accessed to give medicines and fluids. A medicine to relax you (sedative) and a pain reliever will be  given through that access into the vein.  A numbing medicine (local anesthetic) may be sprayed on your throat for comfort and to stop you from gagging or coughing.  A mouth guard may be placed in your mouth to protect your teeth and to keep you from biting on the endoscope.  You will be asked to lie on your left side.  The endoscope is inserted down your throat and into the esophagus, stomach, and duodenum.  Air is put through the endoscope to allow your caregiver to view the lining of your esophagus clearly.  The esophagus, stomach, and duodenum is then examined. During the exam, your caregiver may:  Remove tissue to be examined under a microscope (biopsy) for inflammation, infection, or other medical problems.  Remove growths.  Remove objects (foreign bodies) that are stuck.  Treat any bleeding with medicines or other devices that stop tissues from bleeding (hot cautery, clipping devices).  Widen (dilate) or stretch narrowed areas of  the esophagus and stomach.  The endoscope will then be withdrawn. AFTER THE PROCEDURE  You will be taken to a recovery area to be monitored. You will be able to go home once you are stable and alert.  Do not eat or drink anything until the local anesthetic and numbing medicines have worn off. You may choke.  It is normal to feel bloated, have pain with swallowing, or have a sore throat for a short time. This will wear off.  Your caregiver should be able to discuss his or her findings with you. It will take longer to discuss the test results if any biopsies were taken. Document Released: 05/22/2004 Document Revised: 06/05/2013 Document Reviewed: 12/23/2011 [facility] Patient Information 2015 [place], [place]. This information is not intended to replace advice given to you by your health care provider. Make sure you discuss any questions you have with your health care provider. Colonoscopy A colonoscopy is an exam to look at the entire large intestine (colon). This exam can help find problems such as tumors, polyps, inflammation, and areas of bleeding. The exam takes about 1 hour.  LET [facility] CARE PROVIDER KNOW ABOUT:   Any allergies you have.  All medicines you are taking, including vitamins, herbs, eye drops, creams, and over-the-counter medicines.  Previous problems you or members of your family have had with the use of anesthetics.  Any blood disorders you have.  Previous surgeries you have had.  Medical conditions you have. RISKS AND COMPLICATIONS  Generally, this is a safe procedure. However, as with any procedure, complications can occur. Possible complications include:  Bleeding.  Tearing or rupture of the colon wall.  Reaction to medicines given during the exam.  Infection (rare). BEFORE THE PROCEDURE   Ask your health care provider about changing or stopping your regular medicines.  You may be prescribed an oral bowel prep. This involves drinking a large amount of  medicated liquid, starting the day before your procedure. The liquid will cause you to have multiple loose stools until your stool is almost clear or light green. This cleans out your colon in preparation for the procedure.  Do not eat or drink anything else once you have started the bowel prep, unless your health care provider tells you it is safe to do so.  Arrange for someone to drive you home after the procedure. PROCEDURE   You will be given medicine to help you relax (sedative).  You will lie on your side with your knees bent.  A long, flexible tube  with a light and camera on the end (colonoscope) will be inserted through the rectum and into the colon. The camera sends video back to a computer screen as it moves through the colon. The colonoscope also releases carbon dioxide gas to inflate the colon. This helps your health care provider see the area better.  During the exam, your health care provider may take a small tissue sample (biopsy) to be examined under a microscope if any abnormalities are found.  The exam is finished when the entire colon has been viewed. AFTER THE PROCEDURE   Do not drive for 24 hours after the exam.  You may have a small amount of blood in your stool.  You may pass moderate amounts of gas and have mild abdominal cramping or bloating. This is caused by the gas used to inflate your colon during the exam.  Ask when your test results will be ready and how you will get your results. Make sure you get your test results. Document Released: 01/17/2000 Document Revised: 11/09/2012 Document Reviewed: 09/26/2012 [facility] Patient Information 2015 [place], [place]. This information is not intended to replace advice given to you by your health care provider. Make sure you discuss any questions you have with your health care provider. PATIENT INSTRUCTIONS POST-ANESTHESIA  IMMEDIATELY FOLLOWING SURGERY:  Do not drive or operate machinery for the first twenty four hours  after surgery.  Do not make any important decisions for twenty four hours after surgery or while taking narcotic pain medications or sedatives.  If you develop intractable nausea and vomiting or a severe headache please notify your doctor immediately.  FOLLOW-UP:  Please make an appointment with your surgeon as instructed. You do not need to follow up with anesthesia unless specifically instructed to do so.  WOUND CARE INSTRUCTIONS (if applicable):  Keep a dry clean dressing on the anesthesia/puncture wound site if there is drainage.  Once the wound has quit draining you may leave it open to air.  Generally you should leave the bandage intact for twenty four hours unless there is drainage.  If the epidural site drains for more than 36-48 hours please call the anesthesia department.  QUESTIONS?:  Please feel free to call your physician or the hospital operator if you have any questions, and they will be happy to assist you.

## 2013-09-07 ENCOUNTER — Encounter (HOSPITAL_COMMUNITY): Payer: Self-pay

## 2013-09-07 ENCOUNTER — Encounter (HOSPITAL_COMMUNITY)
Admission: RE | Admit: 2013-09-07 | Discharge: 2013-09-07 | Disposition: A | Discharge status: Home / Self Care | Payer: Medicare HMO | Source: Ambulatory Visit | Attending: Internal Medicine | Admitting: Internal Medicine

## 2013-09-07 DIAGNOSIS — Z01810 Encounter for preprocedural cardiovascular examination: Secondary | ICD-10-CM | POA: Diagnosis not present

## 2013-09-07 DIAGNOSIS — Z01812 Encounter for preprocedural laboratory examination: Secondary | ICD-10-CM | POA: Diagnosis not present

## 2013-09-07 HISTORY — DX: Hypothyroidism, unspecified: E03.9

## 2013-09-07 HISTORY — DX: Exudative age-related macular degeneration, unspecified eye, stage unspecified: H35.3290

## 2013-09-07 HISTORY — DX: Essential (primary) hypertension: I10

## 2013-09-07 HISTORY — DX: Gastro-esophageal reflux disease without esophagitis: K21.9

## 2013-09-07 LAB — BASIC METABOLIC PANEL
Anion gap: 15 (ref 5–15)
BUN: 16 mg/dL (ref 6–23)
CHLORIDE: 99 meq/L (ref 96–112)
CO2: 27 meq/L (ref 19–32)
CREATININE: 0.79 mg/dL (ref 0.50–1.10)
Calcium: 9.2 mg/dL (ref 8.4–10.5)
GFR calc Af Amer: >90 mL/min (ref >90)
GFR calc non Af Amer: 84 mL/min — ABNORMAL LOW (ref >90)
Glucose, Bld: 113 mg/dL — ABNORMAL HIGH (ref 70–99)
Potassium: 3.9 mEq/L (ref 3.7–5.3)
Sodium: 141 mEq/L (ref 137–147)

## 2013-09-07 LAB — HEMOGLOBIN AND HEMATOCRIT, BLOOD
HCT: 39 % (ref 36.0–46.0)
Hemoglobin: 12.5 g/dL (ref 12.0–15.0)

## 2013-09-14 ENCOUNTER — Encounter (HOSPITAL_COMMUNITY): Payer: Medicare HMO | Admitting: Anesthesiology

## 2013-09-14 ENCOUNTER — Ambulatory Visit (HOSPITAL_COMMUNITY): Payer: Medicare HMO | Admitting: Anesthesiology

## 2013-09-14 ENCOUNTER — Encounter (HOSPITAL_COMMUNITY): Payer: Self-pay | Admitting: *Deleted

## 2013-09-14 ENCOUNTER — Encounter (HOSPITAL_COMMUNITY)
Admission: RE | Disposition: A | Discharge status: Home / Self Care | Payer: Self-pay | Source: Ambulatory Visit | Attending: Internal Medicine

## 2013-09-14 ENCOUNTER — Ambulatory Visit (HOSPITAL_COMMUNITY)
Admission: RE | Admit: 2013-09-14 | Discharge: 2013-09-14 | Disposition: A | Discharge status: Home / Self Care | Payer: Medicare HMO | Source: Ambulatory Visit | Attending: Internal Medicine | Admitting: Internal Medicine

## 2013-09-14 DIAGNOSIS — Q391 Atresia of esophagus with tracheo-esophageal fistula: Secondary | ICD-10-CM | POA: Insufficient documentation

## 2013-09-14 DIAGNOSIS — Z1211 Encounter for screening for malignant neoplasm of colon: Secondary | ICD-10-CM

## 2013-09-14 DIAGNOSIS — D126 Benign neoplasm of colon, unspecified: Secondary | ICD-10-CM | POA: Insufficient documentation

## 2013-09-14 DIAGNOSIS — F3289 Other specified depressive episodes: Secondary | ICD-10-CM | POA: Diagnosis not present

## 2013-09-14 DIAGNOSIS — F329 Major depressive disorder, single episode, unspecified: Secondary | ICD-10-CM | POA: Insufficient documentation

## 2013-09-14 DIAGNOSIS — Z79899 Other long term (current) drug therapy: Secondary | ICD-10-CM | POA: Insufficient documentation

## 2013-09-14 DIAGNOSIS — G894 Chronic pain syndrome: Secondary | ICD-10-CM | POA: Insufficient documentation

## 2013-09-14 DIAGNOSIS — M549 Dorsalgia, unspecified: Secondary | ICD-10-CM | POA: Diagnosis not present

## 2013-09-14 DIAGNOSIS — R1310 Dysphagia, unspecified: Secondary | ICD-10-CM | POA: Insufficient documentation

## 2013-09-14 DIAGNOSIS — J45909 Unspecified asthma, uncomplicated: Secondary | ICD-10-CM | POA: Diagnosis not present

## 2013-09-14 DIAGNOSIS — E079 Disorder of thyroid, unspecified: Secondary | ICD-10-CM | POA: Insufficient documentation

## 2013-09-14 DIAGNOSIS — R1319 Other dysphagia: Secondary | ICD-10-CM

## 2013-09-14 DIAGNOSIS — Q393 Congenital stenosis and stricture of esophagus: Secondary | ICD-10-CM | POA: Diagnosis not present

## 2013-09-14 DIAGNOSIS — K5730 Diverticulosis of large intestine without perforation or abscess without bleeding: Secondary | ICD-10-CM

## 2013-09-14 DIAGNOSIS — K6389 Other specified diseases of intestine: Secondary | ICD-10-CM | POA: Diagnosis not present

## 2013-09-14 DIAGNOSIS — Z86010 Personal history of colonic polyps: Secondary | ICD-10-CM

## 2013-09-14 DIAGNOSIS — H409 Unspecified glaucoma: Secondary | ICD-10-CM | POA: Diagnosis not present

## 2013-09-14 DIAGNOSIS — K449 Diaphragmatic hernia without obstruction or gangrene: Secondary | ICD-10-CM | POA: Diagnosis not present

## 2013-09-14 DIAGNOSIS — K222 Esophageal obstruction: Secondary | ICD-10-CM

## 2013-09-14 HISTORY — PX: ESOPHAGOGASTRODUODENOSCOPY (EGD) WITH PROPOFOL: SHX5813

## 2013-09-14 HISTORY — PX: MALONEY DILATION: SHX5535

## 2013-09-14 HISTORY — PX: POLYPECTOMY: SHX5525

## 2013-09-14 HISTORY — PX: COLONOSCOPY WITH PROPOFOL: SHX5780

## 2013-09-14 SURGERY — COLONOSCOPY WITH PROPOFOL
Anesthesia: Monitor Anesthesia Care

## 2013-09-14 MED ORDER — PROPOFOL INFUSION 10 MG/ML OPTIME
INTRAVENOUS | Status: DC | PRN
  Administered 2013-09-14: 125 ug/kg/min via INTRAVENOUS

## 2013-09-14 MED ORDER — LIDOCAINE HCL (PF) 1 % IJ SOLN
INTRAMUSCULAR | Status: AC
Start: 2013-09-14 — End: 2013-09-14
  Filled 2013-09-14: qty 5

## 2013-09-14 MED ORDER — FENTANYL CITRATE 0.05 MG/ML IJ SOLN
25.0000 ug | INTRAMUSCULAR | Status: AC
  Administered 2013-09-14 (×2): 25 ug via INTRAVENOUS
  Filled 2013-09-14: qty 2

## 2013-09-14 MED ORDER — ONDANSETRON HCL 4 MG/2ML IJ SOLN
4.0000 mg | Freq: Once | INTRAMUSCULAR | Status: AC
  Administered 2013-09-14: 4 mg via INTRAVENOUS
  Filled 2013-09-14: qty 2

## 2013-09-14 MED ORDER — LIDOCAINE VISCOUS 2 % MT SOLN
OROMUCOSAL | Status: AC
  Filled 2013-09-14: qty 15

## 2013-09-14 MED ORDER — LACTATED RINGERS IV SOLN
INTRAVENOUS | Status: DC
Start: 2013-09-14 — End: 2013-09-14
  Administered 2013-09-14: 08:00:00 via INTRAVENOUS

## 2013-09-14 MED ORDER — PROPOFOL 10 MG/ML IV BOLUS
INTRAVENOUS | Status: AC
  Filled 2013-09-14: qty 20

## 2013-09-14 MED ORDER — LIDOCAINE VISCOUS 2 % MT SOLN
6.0000 mL | Freq: Once | OROMUCOSAL | Status: AC
  Administered 2013-09-14: 6 mL via OROMUCOSAL
  Filled 2013-09-14: qty 10

## 2013-09-14 MED ORDER — WATER FOR IRRIGATION, STERILE IR SOLN
Status: DC | PRN
  Administered 2013-09-14: 1000 mL

## 2013-09-14 MED ORDER — ONDANSETRON HCL 4 MG/2ML IJ SOLN: 4.0000 mg | Freq: Once | INTRAMUSCULAR | Status: DC | PRN

## 2013-09-14 MED ORDER — MIDAZOLAM HCL 2 MG/2ML IJ SOLN
1.0000 mg | INTRAMUSCULAR | Status: DC | PRN
  Administered 2013-09-14: 2 mg via INTRAVENOUS
  Filled 2013-09-14: qty 2

## 2013-09-14 MED ORDER — PROPOFOL 10 MG/ML IV BOLUS
INTRAVENOUS | Status: DC | PRN
  Administered 2013-09-14 (×3): 10 mg via INTRAVENOUS

## 2013-09-14 MED ORDER — STERILE WATER FOR IRRIGATION IR SOLN
Status: DC | PRN
  Administered 2013-09-14: 09:00:00

## 2013-09-14 MED ORDER — FENTANYL CITRATE 0.05 MG/ML IJ SOLN: 25.0000 ug | INTRAMUSCULAR | Status: DC | PRN

## 2013-09-14 SURGICAL SUPPLY — 28 items
BLOCK BITE 60FR ADLT L/F BLUE (MISCELLANEOUS) ×3 IMPLANT
DEVICE CLIP HEMOSTAT 235CM (CLIP) IMPLANT
ELECT REM PT RETURN 9FT ADLT (ELECTROSURGICAL)
ELECTRODE REM PT RTRN 9FT ADLT (ELECTROSURGICAL) IMPLANT
FCP BXJMBJMB 240X2.8X (CUTTING FORCEPS) ×1
FLOOR PAD 36X40 (MISCELLANEOUS) ×3
FORCEPS BIOP RAD 4 LRG CAP 4 (CUTTING FORCEPS) ×3 IMPLANT
FORCEPS BIOP RJ4 240 W/NDL (CUTTING FORCEPS) ×3
FORCEPS BXJMBJMB 240X2.8X (CUTTING FORCEPS) IMPLANT
FORMALIN 10 PREFIL 20ML (MISCELLANEOUS) IMPLANT
INJECTOR/SNARE I SNARE (MISCELLANEOUS) IMPLANT
KIT CLEAN ENDO COMPLIANCE (KITS) ×3 IMPLANT
LUBRICANT JELLY 4.5OZ STERILE (MISCELLANEOUS) ×2 IMPLANT
MANIFOLD NEPTUNE II (INSTRUMENTS) ×2 IMPLANT
NDL SCLEROTHERAPY 25GX240 (NEEDLE) ×1 IMPLANT
NEEDLE SCLEROTHERAPY 25GX240 (NEEDLE) ×3 IMPLANT
PAD FLOOR 36X40 (MISCELLANEOUS) ×1 IMPLANT
PROBE APC STR FIRE (PROBE) ×3 IMPLANT
PROBE INJECTION GOLD (MISCELLANEOUS) ×3
PROBE INJECTION GOLD 7FR (MISCELLANEOUS) ×1 IMPLANT
SNARE ROTATE MED OVAL 20MM (MISCELLANEOUS) ×1 IMPLANT
SNARE SHORT THROW 13M SML OVAL (MISCELLANEOUS) ×3 IMPLANT
SYR 50ML LL SCALE MARK (SYRINGE) ×2 IMPLANT
SYR INFLATION 60ML (SYRINGE) ×1 IMPLANT
TRAP SPECIMEN MUCOUS 40CC (MISCELLANEOUS) ×2 IMPLANT
TUBING ENDO SMARTCAP PENTAX (MISCELLANEOUS) ×1 IMPLANT
TUBING IRRIGATION ENDOGATOR (MISCELLANEOUS) ×3 IMPLANT
WATER STERILE IRR 1000ML POUR (IV SOLUTION) ×2 IMPLANT

## 2013-09-14 NOTE — Anesthesia Postprocedure Evaluation (Signed)
  Anesthesia Post-op Note  Patient: Heidi Fuentes  Procedure(s) Performed: Procedure(s) with comments: COLONOSCOPY WITH PROPOFOL (N/A) - cecum time in 9:56  time out 10:12     total time 16 minutes  ESOPHAGOGASTRODUODENOSCOPY (EGD) WITH PROPOFOL (N/A) MALONEY DILATION (N/A) - 56 POLYPECTOMY (N/A)  Patient Location: PACU  Anesthesia Type:General  Level of Consciousness: awake, alert  and oriented  Airway and Oxygen Therapy: Patient Spontanous Breathing  Post-op Pain: none  Post-op Assessment: Post-op Vital signs reviewed, Patient's Cardiovascular Status Stable, Respiratory Function Stable, Patent Airway and No signs of Nausea or vomiting  Post-op Vital Signs: Reviewed and stable  Last Vitals:  Filed Vitals:   09/14/13 0855  BP: 121/55  Temp:   Resp: 33    Complications: No apparent anesthesia complications

## 2013-09-14 NOTE — Interval H&P Note (Signed)
History and Physical Interval Note:  09/14/2013 9:00 AM  Heidi Fuentes  has presented today for surgery, with the diagnosis of DYSPHAGIA, SCREENING COLONOSCOPY  The various methods of treatment have been discussed with the patient and family. After consideration of risks, benefits and other options for treatment, the patient has consented to  Procedure(s) with comments: COLONOSCOPY WITH PROPOFOL (N/A) - 10:00-moved to 900-pt notified to arrive at 730 per Kim Faint ESOPHAGOGASTRODUODENOSCOPY (EGD) WITH PROPOFOL (N/A) SAVORY DILATION (N/A) MALONEY DILATION (N/A) as a surgical intervention .  The patient's history has been reviewed, patient examined, no change in status, stable for surgery.  I have reviewed the patient's chart and labs.  Questions were answered to the patient's satisfaction.     [name]  No change. Colonoscopy EGD with esophageal dilation as per plan.  The risks, benefits, limitations, imponderables and alternatives regarding both EGD and colonoscopy have been reviewed with the patient. Questions have been answered. All parties agreeable.

## 2013-09-14 NOTE — Anesthesia Preprocedure Evaluation (Signed)
Anesthesia Evaluation  Patient identified by MRN, date of birth, ID band Patient awake    Reviewed: Allergy & Precautions, H&P , NPO status , Patient's Chart, lab work & pertinent test results  Airway Mallampati: II TM Distance: >3 FB     Dental  (+) Teeth Intact   Pulmonary asthma ,  breath sounds clear to auscultation        Cardiovascular hypertension, Pt. on medications Rhythm:Regular Rate:Normal     Neuro/Psych PSYCHIATRIC DISORDERS Depression Chronic LBP + chronic narcotic Rx  Neuromuscular disease    GI/Hepatic GERD-  Medicated and Poorly Controlled,  Endo/Other  Hypothyroidism   Renal/GU      Musculoskeletal  (+) Fibromyalgia -  Abdominal   Peds  Hematology  (+) anemia ,   Anesthesia Other Findings   Reproductive/Obstetrics                           Anesthesia Physical Anesthesia Plan  ASA: III  Anesthesia Plan: MAC   Post-op Pain Management:    Induction: Intravenous  Airway Management Planned: Simple Face Mask  Additional Equipment:   Intra-op Plan:   Post-operative Plan:   Informed Consent: I have reviewed the patients History and Physical, chart, labs and discussed the procedure including the risks, benefits and alternatives for the proposed anesthesia with the patient or authorized representative who has indicated his/her understanding and acceptance.     Plan Discussed with:   Anesthesia Plan Comments:         Anesthesia Quick Evaluation

## 2013-09-14 NOTE — Op Note (Signed)
[facility] [address] [place], [address]   COLONOSCOPY PROCEDURE REPORT  PATIENT: Heidi, Fuentes  MR#:         [id number] BIRTHDATE: 04-08-1945 , 68  yrs. old GENDER: Female ENDOSCOPIST: R.  Garfield Cornea, MD FACP [facility] REFERRED BY:     Dr. Herbie Baltimore fried PROCEDURE DATE:  09/14/2013 PROCEDURE:     Colonoscopy with snare polypectomy  INDICATIONS: Colorectal cancer screening/constipation  INFORMED CONSENT:  The risks, benefits, alternatives and imponderables including but not limited to bleeding, perforation as well as the possibility of a missed lesion have been reviewed.  The potential for biopsy, lesion removal, etc. have also been discussed.  Questions have been answered.  All parties agreeable. Please see the history and physical in the medical record for more information.  MEDICATIONS: Deep sedation per Dr. [facility]  DESCRIPTION OF PROCEDURE:  After a digital rectal exam was performed, the     colonoscope was advanced from the anus through the rectum and colon to the area of the cecum, ileocecal valve and appendiceal orifice.  The cecum was deeply intubated.  These structures were well-seen and photographed for the record.  From the level of the cecum and ileocecal valve, the scope was slowly and cautiously withdrawn.  The mucosal surfaces were carefully surveyed utilizing scope tip deflection to facilitate fold flattening as needed.  The scope was pulled down into the rectum where a thorough examination including retroflexion was performed.     FINDINGS:  Adequate preparation. Normal rectum. Redundant colon. Scattered pancolonic diverticula; melanosis coli. (1) 5 mm polyp at the hepatic flexure with minimal area of central depression. Otherwise, the remainder of colonic mucosa appeared normal.  THERAPEUTIC / DIAGNOSTIC MANEUVERS PERFORMED:  The above-mentioned polyp was cold snare removed cleanly and recovered for  the pathologist.  COMPLICATIONS: none  CECAL WITHDRAWAL TIME:  15 minutes  IMPRESSION:  Melanosis coli. Colonic diverticulosis. Single colonic polyp-removed as described above.  RECOMMENDATIONS: Followup on pathology. See EGD report. Patient complains of Linzess being too expensive. I would therefore recommend MiraLax 1-2 doses once to twice daily to titrate to one bowel movement daily to every other day.   _______________________________ eSigned:  R. Garfield Cornea, MD FACP [facility] 09/14/2013 10:31 AM   CC:    PATIENT NAME:  Heidi, Fuentes MR#: [id number]

## 2013-09-14 NOTE — Discharge Instructions (Addendum)
Colonoscopy Discharge Instructions  Read the instructions outlined below and refer to this sheet in the next few weeks. These discharge instructions provide you with general information on caring for yourself after you leave the hospital. Your doctor may also give you specific instructions. While your treatment has been planned according to the most current medical practices available, unavoidable complications occasionally occur. If you have any problems or questions after discharge, call Dr. Gala Romney at [phone]. ACTIVITY  You may resume your regular activity, but move at a slower pace for the next 24 hours.   Take frequent rest periods for the next 24 hours.   Walking will help get rid of the air and reduce the bloated feeling in your belly (abdomen).   No driving for 24 hours (because of the medicine (anesthesia) used during the test).    Do not sign any important legal documents or operate any machinery for 24 hours (because of the anesthesia used during the test).  NUTRITION  Drink plenty of fluids.   You may resume your normal diet as instructed by your doctor.   Begin with a light meal and progress to your normal diet. Heavy or fried foods are harder to digest and may make you feel sick to your stomach (nauseated).   Avoid alcoholic beverages for 24 hours or as instructed.  MEDICATIONS  You may resume your normal medications unless your doctor tells you otherwise.  WHAT YOU CAN EXPECT TODAY  Some feelings of bloating in the abdomen.   Passage of more gas than usual.   Spotting of blood in your stool or on the toilet paper.  IF YOU HAD POLYPS REMOVED DURING THE COLONOSCOPY:  No aspirin products for 7 days or as instructed.   No alcohol for 7 days or as instructed.   Eat a soft diet for the next 24 hours.  FINDING OUT THE RESULTS OF YOUR TEST Not all test results are available during your visit. If your test results are not back during the visit, make an appointment  with your caregiver to find out the results. Do not assume everything is normal if you have not heard from your caregiver or the medical facility. It is important for you to follow up on all of your test results.  SEEK IMMEDIATE MEDICAL ATTENTION IF:  You have more than a spotting of blood in your stool.   Your belly is swollen (abdominal distention).   You are nauseated or vomiting.   You have a temperature over 101.  You have abdominal pain or discomfort that is severe or gets worse throughout the day. EGD Discharge instructions Please read the instructions outlined below and refer to this sheet in the next few weeks. These discharge instructions provide you with general information on caring for yourself after you leave the hospital. Your doctor may also give you specific instructions. While your treatment has been planned according to the most current medical practices available, unavoidable complications occasionally occur. If you have any problems or questions after discharge, please call your doctor. ACTIVITY You may resume your regular activity but move at a slower pace for the next 24 hours.  Take frequent rest periods for the next 24 hours.  Walking will help expel (get rid of) the air and reduce the bloated feeling in your abdomen.  No driving for 24 hours (because of the anesthesia (medicine) used during the test).  You may shower.  Do not sign any important legal documents or operate any machinery for 24  hours (because of the anesthesia used during the test).  NUTRITION Drink plenty of fluids.  You may resume your normal diet.  Begin with a light meal and progress to your normal diet.  Avoid alcoholic beverages for 24 hours or as instructed by your caregiver.  MEDICATIONS You may resume your normal medications unless your caregiver tells you otherwise.  WHAT YOU CAN EXPECT TODAY You may experience abdominal discomfort such as a feeling of fullness or gas pains.   FOLLOW-UP Your doctor will discuss the results of your test with you.  SEEK IMMEDIATE MEDICAL ATTENTION IF ANY OF THE FOLLOWING OCCUR: Excessive nausea (feeling sick to your stomach) and/or vomiting.  Severe abdominal pain and distention (swelling).  Trouble swallowing.  Temperature over 101 F (37.8 C).  Rectal bleeding or vomiting of blood.     GERD information provided  Polyp information provided. Constipation information provided. Diverticulosis information provided  Continue Prilosec 20 mg daily  If Linzess is too expensive, use MiraLax 17 g 1-2 times daily to twice daily as needed to achieve one bowel movement daily to every other day  Office visit with [place] in 3 months  High-Fiber Diet Fiber is found in fruits, vegetables, and grains. A high-fiber diet encourages the addition of more whole grains, legumes, fruits, and vegetables in your diet. The recommended amount of fiber for adult males is 38 g per day. For adult females, it is 25 g per day. Pregnant and lactating women should get 28 g of fiber per day. If you have a digestive or bowel problem, ask your caregiver for advice before adding high-fiber foods to your diet. Eat a variety of high-fiber foods instead of only a select few type of foods.  PURPOSE  To increase stool bulk.  To make bowel movements more regular to prevent constipation.  To lower cholesterol.  To prevent overeating. WHEN IS THIS DIET USED?  It may be used if you have constipation and hemorrhoids.  It may be used if you have uncomplicated diverticulosis (intestine condition) and irritable bowel syndrome.  It may be used if you need help with weight management.  It may be used if you want to add it to your diet as a protective measure against atherosclerosis, diabetes, and cancer. SOURCES OF FIBER  Whole-grain breads and cereals.  Fruits, such as apples, oranges, bananas, berries, prunes, and pears.  Vegetables, such as green peas, carrots,  sweet potatoes, beets, broccoli, cabbage, spinach, and artichokes.  Legumes, such split peas, soy, lentils.  Almonds. FIBER CONTENT IN FOODS Starches and Grains / Dietary Fiber (g)  Cheerios, 1 cup / 3 g  Corn Flakes cereal, 1 cup / 0.7 g  Rice crispy treat cereal, 1 cup / 0.3 g  Instant oatmeal (cooked),  cup / 2 g  Frosted wheat cereal, 1 cup / 5.1 g  Brown, long-grain rice (cooked), 1 cup / 3.5 g  White, long-grain rice (cooked), 1 cup / 0.6 g  Enriched macaroni (cooked), 1 cup / 2.5 g Legumes / Dietary Fiber (g)  Baked beans (canned, plain, or vegetarian),  cup / 5.2 g  Kidney beans (canned),  cup / 6.8 g  Pinto beans (cooked),  cup / 5.5 g Breads and Crackers / Dietary Fiber (g)  Plain or honey graham crackers, 2 squares / 0.7 g  Saltine crackers, 3 squares / 0.3 g  Plain, salted pretzels, 10 pieces / 1.8 g  Whole-wheat bread, 1 slice / 1.9 g  White bread, 1 slice / 0.7  g  Raisin bread, 1 slice / 1.2 g  Plain bagel, 3 oz / 2 g  Flour tortilla, 1 oz / 0.9 g  Corn tortilla, 1 small / 1.5 g  Hamburger or hotdog bun, 1 small / 0.9 g Fruits / Dietary Fiber (g)  Apple with skin, 1 medium / 4.4 g  Sweetened applesauce,  cup / 1.5 g  Banana,  medium / 1.5 g  Grapes, 10 grapes / 0.4 g  Orange, 1 small / 2.3 g  Raisin, 1.5 oz / 1.6 g  Melon, 1 cup / 1.4 g Vegetables / Dietary Fiber (g)  Green beans (canned),  cup / 1.3 g  Carrots (cooked),  cup / 2.3 g  Broccoli (cooked),  cup / 2.8 g  Peas (cooked),  cup / 4.4 g  Mashed potatoes,  cup / 1.6 g  Lettuce, 1 cup / 0.5 g  Corn (canned),  cup / 1.6 g  Tomato,  cup / 1.1 g Document Released: 01/19/2005 Document Revised: 07/21/2011 Document Reviewed: 04/23/2011 [employer] Patient Information 2015 [place]. This information is not intended to replace advice given to you by your health care provider. Make sure you discuss any questions you have with your health care  provider. Diverticulosis Diverticulosis is the condition that develops when small pouches (diverticula) form in the wall of your colon. Your colon, or large intestine, is where water is absorbed and stool is formed. The pouches form when the inside layer of your colon pushes through weak spots in the outer layers of your colon. CAUSES  No one knows exactly what causes diverticulosis. RISK FACTORS  Being older than 39. Your risk for this condition increases with age. Diverticulosis is rare in people younger than 40 years. By age 78, almost everyone has it.  Eating a low-fiber diet.  Being frequently constipated.  Being overweight.  Not getting enough exercise.  Smoking.  Taking over-the-counter pain medicines, like aspirin and ibuprofen. SYMPTOMS  Most people with diverticulosis do not have symptoms. DIAGNOSIS  Because diverticulosis often has no symptoms, health care providers often discover the condition during an exam for other colon problems. In many cases, a health care provider will diagnose diverticulosis while using a flexible scope to examine the colon (colonoscopy). TREATMENT  If you have never developed an infection related to diverticulosis, you may not need treatment. If you have had an infection before, treatment may include:  Eating more fruits, vegetables, and grains.  Taking a fiber supplement.  Taking a live bacteria supplement (probiotic).  Taking medicine to relax your colon. HOME CARE INSTRUCTIONS   Drink at least 6-8 glasses of water each day to prevent constipation.  Try not to strain when you have a bowel movement.  Keep all follow-up appointments. If you have had an infection before:  Increase the fiber in your diet as directed by your health care provider or dietitian.  Take a dietary fiber supplement if your health care provider approves.  Only take medicines as directed by your health care provider. SEEK MEDICAL CARE IF:   You have  abdominal pain.  You have bloating.  You have cramps.  You have not gone to the bathroom in 3 days. SEEK IMMEDIATE MEDICAL CARE IF:   Your pain gets worse.  Yourbloating becomes very bad.  You have a fever or chills, and your symptoms suddenly get worse.  You begin vomiting.  You have bowel movements that are bloody or black. MAKE SURE YOU:  Understand these instructions.  Will  watch your condition.  Will get help right away if you are not doing well or get worse. Document Released: 10/17/2003 Document Revised: 01/24/2013 Document Reviewed: 12/14/2012 [facility] Patient Information 2015 [place], [place]. This information is not intended to replace advice given to you by your health care provider. Make sure you discuss any questions you have with your health care provider. Constipation Constipation is when a person has fewer than three bowel movements a week, has difficulty having a bowel movement, or has stools that are dry, hard, or larger than normal. As people grow older, constipation is more common. If you try to fix constipation with medicines that make you have a bowel movement (laxatives), the problem may get worse. Long-term laxative use may cause the muscles of the colon to become weak. A low-fiber diet, not taking in enough fluids, and taking certain medicines may make constipation worse.  CAUSES   Certain medicines, such as antidepressants, pain medicine, iron supplements, antacids, and water pills.   Certain diseases, such as diabetes, irritable bowel syndrome (IBS), thyroid disease, or depression.   Not drinking enough water.   Not eating enough fiber-rich foods.   Stress or travel.   Lack of physical activity or exercise.   Ignoring the urge to have a bowel movement.   Using laxatives too much.  SIGNS AND SYMPTOMS   Having fewer than three bowel movements a week.   Straining to have a bowel movement.   Having stools that are hard, dry, or  larger than normal.   Feeling full or bloated.   Pain in the lower abdomen.   Not feeling relief after having a bowel movement.  DIAGNOSIS  Your health care provider will take a medical history and perform a physical exam. Further testing may be done for severe constipation. Some tests may include:  A barium enema X-ray to examine your rectum, colon, and, sometimes, your small intestine.   A sigmoidoscopy to examine your lower colon.   A colonoscopy to examine your entire colon. TREATMENT  Treatment will depend on the severity of your constipation and what is causing it. Some dietary treatments include drinking more fluids and eating more fiber-rich foods. Lifestyle treatments may include regular exercise. If these diet and lifestyle recommendations do not help, your health care provider may recommend taking over-the-counter laxative medicines to help you have bowel movements. Prescription medicines may be prescribed if over-the-counter medicines do not work.  HOME CARE INSTRUCTIONS   Eat foods that have a lot of fiber, such as fruits, vegetables, whole grains, and beans.  Limit foods high in fat and processed sugars, such as french fries, hamburgers, cookies, candies, and soda.   A fiber supplement may be added to your diet if you cannot get enough fiber from foods.   Drink enough fluids to keep your urine clear or pale yellow.   Exercise regularly or as directed by your health care provider.   Go to the restroom when you have the urge to go. Do not hold it.   Only take over-the-counter or prescription medicines as directed by your health care provider. Do not take other medicines for constipation without talking to your health care provider first.  [place] IF:   You have bright red blood in your stool.   Your constipation lasts for more than 4 days or gets worse.   You have abdominal or rectal pain.   You have thin, pencil-like stools.    You have unexplained weight loss. MAKE SURE  YOU:   Understand these instructions.  Will watch your condition.  Will get help right away if you are not doing well or get worse. Document Released: 10/18/2003 Document Revised: 01/24/2013 Document Reviewed: 10/31/2012 [facility] Patient Information 2015 [place], [place]. This information is not intended to replace advice given to you by your health care provider. Make sure you discuss any questions you have with your health care provider. Colon Polyps Polyps are lumps of extra tissue growing inside the body. Polyps can grow in the large intestine (colon). Most colon polyps are noncancerous (benign). However, some colon polyps can become cancerous over time. Polyps that are larger than a pea may be harmful. To be safe, caregivers remove and test all polyps. CAUSES  Polyps form when mutations in the genes cause your cells to grow and divide even though no more tissue is needed. RISK FACTORS There are a number of risk factors that can increase your chances of getting colon polyps. They include: Being older than 50 years. Family history of colon polyps or colon cancer. Long-term colon diseases, such as colitis or Crohn disease. Being overweight. Smoking. Being inactive. Drinking too much alcohol. SYMPTOMS  Most small polyps do not cause symptoms. If symptoms are present, they may include: Blood in the stool. The stool may look dark red or black. Constipation or diarrhea that lasts longer than 1 week. DIAGNOSIS People often do not know they have polyps until their caregiver finds them during a regular checkup. Your caregiver can use 4 tests to check for polyps: Digital rectal exam. The caregiver wears gloves and feels inside the rectum. This test would find polyps only in the rectum. Barium enema. The caregiver puts a liquid called barium into your rectum before taking X-rays of your colon. Barium makes your colon look white. Polyps are dark,  so they are easy to see in the X-ray pictures. Sigmoidoscopy. A thin, flexible tube (sigmoidoscope) is placed into your rectum. The sigmoidoscope has a light and tiny camera in it. The caregiver uses the sigmoidoscope to look at the last third of your colon. Colonoscopy. This test is like sigmoidoscopy, but the caregiver looks at the entire colon. This is the most common method for finding and removing polyps. TREATMENT  Any polyps will be removed during a sigmoidoscopy or colonoscopy. The polyps are then tested for cancer. PREVENTION  To help lower your risk of getting more colon polyps: Eat plenty of fruits and vegetables. Avoid eating fatty foods. Do not smoke. Avoid drinking alcohol. Exercise every day. Lose weight if recommended by your caregiver. Eat plenty of calcium and folate. Foods that are rich in calcium include milk, cheese, and broccoli. Foods that are rich in folate include chickpeas, kidney beans, and spinach. HOME CARE INSTRUCTIONS Keep all follow-up appointments as directed by your caregiver. You may need periodic exams to check for polyps. SEEK MEDICAL CARE IF: You notice bleeding during a bowel movement. Document Released: 10/16/2003 Document Revised: 04/13/2011 Document Reviewed: 03/31/2011 [facility] Patient Information 2015 [place], [place]. This information is not intended to replace advice given to you by your health care provider. Make sure you discuss any questions you have with your health care provider. Gastroesophageal Reflux Disease, Adult Gastroesophageal reflux disease (GERD) happens when acid from your stomach flows up into the esophagus. When acid comes in contact with the esophagus, the acid causes soreness (inflammation) in the esophagus. Over time, GERD may create small holes (ulcers) in the lining of the esophagus. CAUSES  Increased body weight. This puts  pressure on the stomach, making acid rise from the stomach into the esophagus. Smoking. This increases  acid production in the stomach. Drinking alcohol. This causes decreased pressure in the lower esophageal sphincter (valve or ring of muscle between the esophagus and stomach), allowing acid from the stomach into the esophagus. Late evening meals and a full stomach. This increases pressure and acid production in the stomach. A malformed lower esophageal sphincter. Sometimes, no cause is found. SYMPTOMS  Burning pain in the lower part of the mid-chest behind the breastbone and in the mid-stomach area. This may occur twice a week or more often. Trouble swallowing. Sore throat. Dry cough. Asthma-like symptoms including chest tightness, shortness of breath, or wheezing. DIAGNOSIS  Your caregiver may be able to diagnose GERD based on your symptoms. In some cases, X-rays and other tests may be done to check for complications or to check the condition of your stomach and esophagus. TREATMENT  Your caregiver may recommend over-the-counter or prescription medicines to help decrease acid production. Ask your caregiver before starting or adding any new medicines.  HOME CARE INSTRUCTIONS  Change the factors that you can control. Ask your caregiver for guidance concerning weight loss, quitting smoking, and alcohol consumption. Avoid foods and drinks that make your symptoms worse, such as: Caffeine or alcoholic drinks. Chocolate. Peppermint or mint flavorings. Garlic and onions. Spicy foods. Citrus fruits, such as oranges, lemons, or limes. Tomato-based foods such as sauce, chili, salsa, and pizza. Fried and fatty foods. Avoid lying down for the 3 hours prior to your bedtime or prior to taking a nap. Eat small, frequent meals instead of large meals. Wear loose-fitting clothing. Do not wear anything tight around your waist that causes pressure on your stomach. Raise the head of your bed 6 to 8 inches with wood blocks to help you sleep. Extra pillows will not help. Only take over-the-counter or  prescription medicines for pain, discomfort, or fever as directed by your caregiver. Do not take aspirin, ibuprofen, or other nonsteroidal anti-inflammatory drugs (NSAIDs). SEEK IMMEDIATE MEDICAL CARE IF:  You have pain in your arms, neck, jaw, teeth, or back. Your pain increases or changes in intensity or duration. You develop nausea, vomiting, or sweating (diaphoresis). You develop shortness of breath, or you faint. Your vomit is green, yellow, black, or looks like coffee grounds or blood. Your stool is red, bloody, or black. These symptoms could be signs of other problems, such as heart disease, gastric bleeding, or esophageal bleeding. MAKE SURE YOU:  Understand these instructions. Will watch your condition. Will get help right away if you are not doing well or get worse. Document Released: 10/29/2004 Document Revised: 04/13/2011 Document Reviewed: 08/08/2010 [facility] Patient Information 2015 [place], [place]. This information is not intended to replace advice given to you by your health care provider. Make sure you discuss any questions you have with your health care provider.

## 2013-09-14 NOTE — Transfer of Care (Signed)
Immediate Anesthesia Transfer of Care Note  Patient: Heidi Fuentes  Procedure(s) Performed: Procedure(s) with comments: COLONOSCOPY WITH PROPOFOL (N/A) - cecum time in 9:56  time out 10:12     total time 16 minutes  ESOPHAGOGASTRODUODENOSCOPY (EGD) WITH PROPOFOL (N/A) MALONEY DILATION (N/A) - 52 POLYPECTOMY (N/A)  Patient Location: PACU  Anesthesia Type:General  Level of Consciousness: awake, alert  and oriented  Airway & Oxygen Therapy: Patient Spontanous Breathing  Post-op Assessment: Report given to PACU RN  Post vital signs: Reviewed and stable  Complications: No apparent anesthesia complications

## 2013-09-14 NOTE — H&P (View-Only) (Signed)
Referring Provider: Abigail Miyamoto, MD Primary Care Physician:  Abigail Miyamoto, MD Primary GI: Dr. Gala Romney   Chief Complaint  Patient presents with  . DISCUSS TCS AND EGD    HPI:   68 year old female presents today in hospital follow-up from June 2015, admitted with constipation/obstipation in the setting of chronic narcotics, with question of colitis which is non-specific. May actually be secondary to large stool load creating a pressure effect. After milk and molasses enema, SMOG enema, double dose Miralax BID and Linzess 290 mcg daily, she had good results.   Anemia: unclear baseline.  Drift in Hgb to 8.5 from 10 range during hospital admission. No overt signs of GI bleeding. Esophageal dysphagia: chronic.   No Linzess in 4 days. Had issues with pharmacy. Had a "good BM for me" a few days ago. Had to use finger to push down stool. Has to put one finger in vagina and one in rectum. No hematochezia or melena. Has felt nauseated. Chronic solid food dysphagia. Has sour stomach. Prilosec daily.   Past Medical History  Diagnosis Date  . Fibromyalgia   . Depression   . Thyroid disease   . Back pain   . Asthma   . Chronic pain syndrome 07/10/2013  . Duodenal papillary stenosis   . Macular degeneration   . Glaucoma     Past Surgical History  Procedure Laterality Date  . Tonsillectomy    . Hemorroidectomy    . Hernia repair    . Coccyx removal    . Colonoscopy  2005    Dr. Laural Golden: mild melanosis coli, otherwise normal  . Ercp  1997    [facility]: biliary manometry abnormal, subsequent sphincterotomy   . Foot surgery      X2    Current Outpatient Prescriptions  Medication Sig Dispense Refill  . acetaminophen (TYLENOL) 500 MG tablet Take 500 mg by mouth every 6 (six) hours as needed. For pain       . albuterol (VENTOLIN HFA) 108 (90 BASE) MCG/ACT inhaler Inhale 2 puffs into the lungs every 6 (six) hours as needed. For shortness of breath       . cyclobenzaprine (FLEXERIL) 10 MG  tablet Take 10 mg by mouth at bedtime.      . furosemide (LASIX) 40 MG tablet Take 40 mg by mouth daily.      [name] HYDROcodone-acetaminophen (NORCO) 10-325 MG per tablet Take 1 tablet by mouth every 4 (four) hours as needed. pain      . lamoTRIgine (LAMICTAL) 100 MG tablet Take 150 mg by mouth 2 (two) times daily.       [name] latanoprost (XALATAN) 0.005 % ophthalmic solution Place 1 drop into both eyes at bedtime.      [name] levothyroxine (SYNTHROID, LEVOTHROID) 50 MCG tablet Take 50 mcg by mouth daily.        . Linaclotide (LINZESS) 290 MCG CAPS capsule Take 1 capsule (290 mcg total) by mouth daily. Take 30 minutes before breakfast daily.  30 capsule  3  . metoprolol succinate (TOPROL-XL) 50 MG 24 hr tablet Take 50 mg by mouth daily.      . montelukast (SINGULAIR) 10 MG tablet Take 10 mg by mouth daily.      [name] morphine (MS CONTIN) 30 MG 12 hr tablet Take 30 mg by mouth every 12 (twelve) hours.      . NON FORMULARY PRESERVISION   ONE TABLET DAILY      . NON FORMULARY IRON  NOT SURE STRENGTH      . omeprazole (PRILOSEC) 20 MG capsule Take 20 mg by mouth daily.      . polyethylene glycol (MIRALAX / GLYCOLAX) packet Take 17 g by mouth 2 (two) times daily.  60 each  5  . QUEtiapine (SEROQUEL) 50 MG tablet Take 50 mg by mouth at bedtime.       No current facility-administered medications for this visit.    Allergies as of 08/17/2013 - Review Complete 08/17/2013  Allergen Reaction Noted  . Sulfa antibiotics Rash 08/26/2010    Family History  Problem Relation Age of Onset  . Colon cancer Neg Hx   . Lung cancer Mother   . Heart attack Father   . Bone cancer Brother   . COPD Sister     History   Social History  . Marital Status: Divorced    Spouse Name: N/A    Number of Children: N/A  . Years of Education: N/A   Social History Main Topics  . Smoking status: Never Smoker   . Smokeless tobacco: None     Comment: NEVER SMOKED  . Alcohol Use: No  . Drug Use: No  . Sexual Activity: None    Other Topics Concern  . None   Social History Narrative  . None    Review of Systems: As mentioned in HPI  Physical Exam: BP 145/80  Pulse 94  Temp(Src) 97 F (36.1 C) (Oral)  Ht 5' (1.524 m)  Wt 140 lb 9.6 oz (63.776 kg)  BMI 27.46 kg/m2 General:   Alert and oriented. No distress noted. Pleasant and cooperative.  Head:  Normocephalic and atraumatic. Eyes:  Conjuctiva clear without scleral icterus. Mouth:  Oral mucosa pink and moist. Good dentition. No lesions. Heart:  S1, S2 present without murmurs, rubs, or gallops. Regular rate and rhythm. Abdomen:  +BS, soft, non-tender and non-distended. No rebound or guarding. No HSM or masses noted. Msk:  Symmetrical without gross deformities. Normal posture. Extremities:  Without edema. Neurologic:  Alert and  oriented x4;  grossly normal neurologically. Skin:  Intact without significant lesions or rashes. Psych:  Alert and cooperative. Normal mood and affect.  Lab Results  Component Value Date   WBC 5.8 07/12/2013   HGB 8.5* 07/12/2013   HCT 27.0* 07/12/2013   MCV 78.0 07/12/2013   PLT 318 07/12/2013   Lab Results  Component Value Date   IRON 18* 07/12/2013   TIBC 252 07/12/2013   FERRITIN 21 07/12/2013

## 2013-09-14 NOTE — Op Note (Signed)
[facility] [address] [place], [address]   ENDOSCOPY PROCEDURE REPORT  PATIENT: Oni, Dietzman  MR#: [id number] BIRTHDATE: [birth year] , 89  yrs. old GENDER: Female ENDOSCOPIST: R.  Garfield Cornea, MD FACP [facility] REFERRED BY:     Herbie Baltimore freed M.D. PROCEDURE DATE:  09/14/2013 PROCEDURE:     EGD with Venia Minks dilation  INDICATIONS:     Esophageal dysphagia  INFORMED CONSENT:   The risks, benefits, limitations, alternatives and imponderables have been discussed.  The potential for biopsy, esophogeal dilation, etc. have also been reviewed.  Questions have been answered.  All parties agreeable.  Please see the history and physical in the medical record for more information.  MEDICATIONS:     Deep sedation per Dr. [facility]  DESCRIPTION OF PROCEDURE:   The     endoscope was introduced through the mouth and advanced to the second portion of the duodenum without difficulty or limitations.  The mucosal surfaces were surveyed very carefully during advancement of the scope and upon withdrawal.  Retroflexion view of the proximal stomach and esophagogastric junction was performed.      FINDINGS: Prominent Schatzki's ring. No esophagitis or evidence of Barrett's esophagus. Stomach empty. 5 cm hiatal hernia; otherwise, the remainder of the gastric mucosa appeared normal. Patent pylorus. Normal first and second portion of the duodenum.  THERAPEUTIC / DIAGNOSTIC MANEUVERS PERFORMED:  A 56 French Maloney dilator was passed to full insertion with slight resistance. A look back revealed the ring remained intact without apparent complication. I did not feel a larger bore dilator would go to her relatively small hypopharynx. Consequently, I obtained the jumbo biopsy forceps and performed 4 quadrant "bites" of the ring retroflex which disrupted it nicely without appeared complication.   COMPLICATIONS:  None  IMPRESSION:   Schatzki's ring. Hiatal hernia.  Status post Maloney dilation and biopsy disruption of the ring  RECOMMENDATIONS:   Daily PPI. See colonoscopy report.    _______________________________ R. Garfield Cornea, MD FACP [facility] eSigned:  R. Garfield Cornea, MD FACP [facility] 09/14/2013 10:27 AM     CC:

## 2013-09-15 ENCOUNTER — Encounter: Payer: Self-pay | Admitting: Internal Medicine

## 2013-09-15 ENCOUNTER — Encounter (HOSPITAL_COMMUNITY): Payer: Self-pay | Admitting: Internal Medicine

## 2013-09-21 ENCOUNTER — Telehealth: Payer: Self-pay | Admitting: General Practice

## 2013-09-21 NOTE — Telephone Encounter (Signed)
Patient called wanting to speak with you about her colonoscopy.  She wanted to make sure you called to get approval for her procedure.  I found an approval number for her procedure #[id number].

## 2013-11-23 ENCOUNTER — Telehealth: Payer: Self-pay | Admitting: Internal Medicine

## 2013-11-23 NOTE — Telephone Encounter (Signed)
Patient asked if we could call something into [facility] in [place] for her constipation. She hasn't had a BM since Monday and is afraid that she will end up in the hospital like last time.  Patient called back a second time asking to speak with someone about her prescriptions of Amitiza and Linzess costing over $300. I told her that I could add that to the first message on her or I could transfer her to [facility]. She agreed for me to add to the phone note and would like for someone to call her back. [phone]

## 2013-11-23 NOTE — Telephone Encounter (Signed)
I spoke with the pt- she took MOM on Sunday and had a good BM on Monday, she has not had one since. She is taking miralax bid and 2 stool softeners daily. Linzess is too expensive.  She is not taking any fiber. She is afraid that she is going to get "backed up" again like when she was in the hospital in June. I spoke with LSL- ok for pt to take max dose of MOM tonight and come tomorrow and get some Linzess samples, and pt needs to be taking some fiber. I advised pt of LSL recommendations, she verbalized understanding and will come tomorrow morning to pick up Linzess samples. #5 boxes at the front desk. Pt stated she was currently taking pain medications and she knew that was contributing to the problem. She also stated fiber worked well for her in the past but she quit taking it. She will go to the store tomorrow and get some fiber.

## 2013-11-26 NOTE — Telephone Encounter (Signed)
Keep ov as planned.

## 2013-11-30 ENCOUNTER — Telehealth: Payer: Self-pay | Admitting: Internal Medicine

## 2013-11-30 NOTE — Telephone Encounter (Signed)
Pt called the office asking about papers we were to send off and then was asking for a Path report. I asked her what type of papers was she talking about. She doesn't know. I asked was the nurse helping her with her medications and she doesn't know. I asked if she had requested medical records and she said no, but she needed her path report.  I told her that I didn't understand what she was asking if she didn't know. I told her that JL would be back this afternoon and I would follow up with her and maybe she knows something. I also told her that I would send her a release of records to sign unless she wanted to stop by the office to get the path report and she said she is going through so much right now and lives 20 minutes away. I told her that I would mail the release to her this afternoon. Patient appears to be very confused. Please call her at [phone]

## 2013-11-30 NOTE — Telephone Encounter (Signed)
Arlice Colt dont have anything for this pt.

## 2013-12-06 ENCOUNTER — Other Ambulatory Visit (HOSPITAL_COMMUNITY): Payer: Self-pay | Admitting: Family Medicine

## 2013-12-06 DIAGNOSIS — Z1231 Encounter for screening mammogram for malignant neoplasm of breast: Secondary | ICD-10-CM

## 2013-12-12 ENCOUNTER — Encounter: Payer: Self-pay | Admitting: *Deleted

## 2013-12-14 ENCOUNTER — Ambulatory Visit (HOSPITAL_COMMUNITY): Payer: Medicare HMO

## 2013-12-15 ENCOUNTER — Encounter: Payer: Self-pay | Admitting: Gastroenterology

## 2013-12-15 ENCOUNTER — Ambulatory Visit (HOSPITAL_COMMUNITY)
Admission: RE | Admit: 2013-12-15 | Discharge: 2013-12-15 | Disposition: A | Discharge status: Home / Self Care | Payer: Medicare HMO | Source: Ambulatory Visit | Attending: Family Medicine | Admitting: Family Medicine

## 2013-12-15 ENCOUNTER — Ambulatory Visit (INDEPENDENT_AMBULATORY_CARE_PROVIDER_SITE_OTHER): Payer: Commercial Managed Care - HMO | Admitting: Gastroenterology

## 2013-12-15 VITALS — BP 122/64 | HR 75 | Temp 97.7°F | Ht 60.0 in | Wt 134.0 lb

## 2013-12-15 DIAGNOSIS — D509 Iron deficiency anemia, unspecified: Secondary | ICD-10-CM | POA: Insufficient documentation

## 2013-12-15 DIAGNOSIS — Z1231 Encounter for screening mammogram for malignant neoplasm of breast: Secondary | ICD-10-CM | POA: Insufficient documentation

## 2013-12-15 DIAGNOSIS — K5901 Slow transit constipation: Secondary | ICD-10-CM

## 2013-12-15 DIAGNOSIS — K222 Esophageal obstruction: Secondary | ICD-10-CM

## 2013-12-15 DIAGNOSIS — Q394 Esophageal web: Secondary | ICD-10-CM

## 2013-12-15 LAB — CBC WITH DIFFERENTIAL/PLATELET
Basophils Absolute: 0 10*3/uL (ref 0.0–0.1)
Basophils Relative: 0 % (ref 0–1)
EOS PCT: 4 % (ref 0–5)
Eosinophils Absolute: 0.3 10*3/uL (ref 0.0–0.7)
HEMATOCRIT: 40.7 % (ref 36.0–46.0)
Hemoglobin: 13.8 g/dL (ref 12.0–15.0)
LYMPHS ABS: 2 10*3/uL (ref 0.7–4.0)
LYMPHS PCT: 26 % (ref 12–46)
MCH: 28.5 pg (ref 26.0–34.0)
MCHC: 33.9 g/dL (ref 30.0–36.0)
MCV: 84.1 fL (ref 78.0–100.0)
MONO ABS: 0.6 10*3/uL (ref 0.1–1.0)
Monocytes Relative: 8 % (ref 3–12)
Neutro Abs: 4.8 10*3/uL (ref 1.7–7.7)
Neutrophils Relative %: 62 % (ref 43–77)
Platelets: 297 10*3/uL (ref 150–400)
RBC: 4.84 MIL/uL (ref 3.87–5.11)
RDW: 14.7 % (ref 11.5–15.5)
WBC: 7.8 10*3/uL (ref 4.0–10.5)

## 2013-12-15 MED ORDER — LINACLOTIDE 290 MCG PO CAPS: 290.0000 ug | ORAL_CAPSULE | Freq: Every day | ORAL | Status: DC

## 2013-12-15 MED ORDER — BENEFIBER PO POWD: ORAL | Status: DC

## 2013-12-15 NOTE — Progress Notes (Signed)
Primary Care Physician: Abigail Miyamoto, MD  Primary Gastroenterologist:  Garfield Cornea, MD   Chief Complaint  Patient presents with  . Follow-up    HPI: Heidi Fuentes is a 68 y.o. female here for follow up of constipation, dysphagia, anemia (multifactorial). Saw Laban Emperor, NP 08/2013 for hospital follow up. Admitted at that time with constipation/obstipation in setting of chronic narcotics. ?of colitis. Hgb was 8.5 with hydration. Came off of iron for the colonoscopy and never restarted. Hgb was normal at pre-op in 09/2013.     [name] Colonoscopy with propofol N/A 09/14/2013    VOH:[id number] coli. Colonic diverticulosis. Single colonic. Tubular adenoma. Next TCS 09/2020.  [name] Esophagogastroduodenoscopy (egd) with propofol N/A 09/14/2013    IRS:[id number] ring. Hiatal hernia. Status post Venia Minks and biopsy disruption.     Continues to have a lot of problems with bowel function. Unable to afford Linzess or Amitiza. We have been providing samples of Linzess 275mcg. She is also taking miralax BID and stool softner. Has a BM about 3 times per week but very small amount. Stools are "stringy". No melena, brbpr. Has had constipation for decades. Has used MOM if no BM in 3 days. Seems to help. Never had BM more than 1-2 per week in her life. Worried about getting blocked again. "happened within 2 days" last time. Currently without abdominal pain/bloating. Intermittent nausea. No heartburn. Swallowing improved since dilation overall, rare difficulty swallowing.     Current Outpatient Prescriptions  Medication Sig Dispense Refill  . acetaminophen (TYLENOL) 500 MG tablet Take 500 mg by mouth every 6 (six) hours as needed. For pain     . albuterol (VENTOLIN HFA) 108 (90 BASE) MCG/ACT inhaler Inhale 2 puffs into the lungs every 6 (six) hours as needed. For shortness of breath     . cyclobenzaprine (FLEXERIL) 10 MG tablet Take 10 mg by mouth at bedtime.    [name] FLUoxetine (PROZAC) 20 MG tablet Take 20 mg by  mouth daily.    . furosemide (LASIX) 40 MG tablet Take 40 mg by mouth daily.    [name] HYDROcodone-acetaminophen (NORCO) 10-325 MG per tablet Take 1 tablet by mouth every 4 (four) hours as needed. pain    . lamoTRIgine (LAMICTAL) 100 MG tablet Take 150 mg by mouth 2 (two) times daily.     [name] latanoprost (XALATAN) 0.005 % ophthalmic solution Place 1 drop into both eyes at bedtime.    [name] levothyroxine (SYNTHROID, LEVOTHROID) 50 MCG tablet Take 50 mcg by mouth daily.      . metoprolol succinate (TOPROL-XL) 50 MG 24 hr tablet Take 50 mg by mouth daily.    . montelukast (SINGULAIR) 10 MG tablet Take 10 mg by mouth daily.    [name] morphine (MS CONTIN) 30 MG 12 hr tablet Take 30 mg by mouth every 12 (twelve) hours.    . Multiple Vitamins-Minerals (PRESERVISION/LUTEIN PO) Take 1 tablet by mouth daily.    [name] omeprazole (PRILOSEC) 20 MG capsule Take 20 mg by mouth daily.    . polyethylene glycol (MIRALAX / GLYCOLAX) packet Take 17 g by mouth 2 (two) times daily. 60 each 5  . QUEtiapine (SEROQUEL) 50 MG tablet Take 50 mg by mouth at bedtime.     No current facility-administered medications for this visit.    Allergies as of 12/15/2013 - Review Complete 12/15/2013  Allergen Reaction Noted  . Sulfa antibiotics Rash 08/26/2010    ROS:  General: Negative for anorexia, weight loss, fever, chills, fatigue,  weakness. ENT: Negative for hoarseness, difficulty swallowing , nasal congestion. CV: Negative for chest pain, angina, palpitations, dyspnea on exertion, peripheral edema.  Respiratory: Negative for dyspnea at rest, dyspnea on exertion, cough, sputum, wheezing.  GI: See history of present illness. GU:  Negative for dysuria, hematuria, urinary incontinence, urinary frequency, nocturnal urination.  Endo: Negative for unusual weight change.    Physical Examination:   BP 122/64 mmHg  Pulse 75  Temp(Src) 97.7 F (36.5 C) (Oral)  Ht 5' (1.524 m)  Wt 134 lb (60.782 kg)  BMI 26.17 kg/m2  General:  Well-nourished, well-developed in no acute distress.  Eyes: No icterus. Mouth: Oropharyngeal mucosa moist and pink , no lesions erythema or exudate. Lungs: Clear to auscultation bilaterally.  Heart: Regular rate and rhythm, no murmurs rubs or gallops.  Abdomen: Bowel sounds are normal, nontender, nondistended, no hepatosplenomegaly or masses, no abdominal bruits or hernia , no rebound or guarding.   Extremities: No lower extremity edema. No clubbing or deformities. Neuro: Alert and oriented x 4   Skin: Warm and dry, no jaundice.   Psych: Alert and cooperative, normal mood and affect.  Labs:  Lab Results  Component Value Date   IRON 18* 07/12/2013   TIBC 252 07/12/2013   FERRITIN 21 07/12/2013   Lab Results  Component Value Date   WBC 5.8 07/12/2013   HGB 12.5 09/07/2013   HCT 39.0 09/07/2013   MCV 78.0 07/12/2013   PLT 318 07/12/2013    Imaging Studies: No results found.

## 2013-12-15 NOTE — Assessment & Plan Note (Signed)
Patient with small BMs, couple of times per week. H/o obstipation. Add Benefiber 2 tsp TID, work up to TID over the course of 1 week. Continue Miralax BID, Linzess 292mcg daily (samples provided). Stop colace. Hopefully this regimen will be effective, but she should call if develops abdominal distention/bloating, diminished stool output. Could consider abd film at that time.

## 2013-12-15 NOTE — Patient Instructions (Addendum)
1. Continue Miralax one capful twice daily. 2. Start Benefiber. Take 2 teaspoons daily and work up to three times daily over the next 5 -7 days.  3. Linzess 260mcg once daily. 4. If you feel like you are not having adequate stools or becoming bloating, let me know and we will get an xray. 5. Please have your labs done within the next 1 week. 6. Call if you swallowing problems return or worsen.  7. Return to the office in 3 months.

## 2013-12-15 NOTE — Assessment & Plan Note (Signed)
H/o multifactorial anemia with low normal ferritin, low iron/normal TIBC. Last Hgb normal in 09/2013. Recheck labs. EGD/TCS up to date.

## 2013-12-15 NOTE — Assessment & Plan Note (Signed)
Swallowing improved. Occasional persistent symptoms. If worsens, consider BPE.

## 2013-12-16 LAB — IRON AND TIBC
%SAT: 21 % (ref 20–55)
Iron: 84 ug/dL (ref 42–145)
TIBC: 404 ug/dL (ref 250–470)
UIBC: 320 ug/dL (ref 125–400)

## 2013-12-16 LAB — FERRITIN: Ferritin: 19 ng/mL (ref 10–291)

## 2013-12-25 NOTE — Progress Notes (Signed)
Quick Note:  Please let patient know her Hgb and iron continue to improve and are normal. Her ferritin (indiciation of iron storage) is borderline low but improved. She should take iron one table three times per week. Recheck Hgb/Hct, iron, ferritin in 4 months. ______

## 2013-12-26 ENCOUNTER — Other Ambulatory Visit: Payer: Self-pay | Admitting: Gastroenterology

## 2013-12-26 DIAGNOSIS — D509 Iron deficiency anemia, unspecified: Secondary | ICD-10-CM

## 2013-12-27 NOTE — Progress Notes (Signed)
cc'ed to pcp °

## 2014-02-05 ENCOUNTER — Telehealth: Payer: Self-pay | Admitting: Internal Medicine

## 2014-02-05 NOTE — Telephone Encounter (Signed)
#  4 boxes of linzess 219mcg are at the front desk for pt to pick up. Tried to call and inform pt- LMOM

## 2014-02-05 NOTE — Telephone Encounter (Signed)
Patient called stating that Heidi Fuentes told her to call here and we could give her samples of linzess.  Please call [phone]

## 2014-02-06 NOTE — Telephone Encounter (Signed)
Noted  

## 2014-02-16 DIAGNOSIS — H3532 Exudative age-related macular degeneration: Secondary | ICD-10-CM | POA: Diagnosis not present

## 2014-02-28 ENCOUNTER — Telehealth: Payer: Self-pay | Admitting: Internal Medicine

## 2014-02-28 NOTE — Telephone Encounter (Signed)
Pt called today asking if she could get samples of Linzess. She can't afford to pay for prescription. Please advise [phone]

## 2014-03-01 NOTE — Telephone Encounter (Signed)
noted 

## 2014-03-01 NOTE — Telephone Encounter (Signed)
Gave #3 boxes of linzess 230mcg.

## 2014-03-03 DIAGNOSIS — G894 Chronic pain syndrome: Secondary | ICD-10-CM | POA: Diagnosis not present

## 2014-03-03 DIAGNOSIS — Z79891 Long term (current) use of opiate analgesic: Secondary | ICD-10-CM | POA: Diagnosis not present

## 2014-03-03 DIAGNOSIS — M5134 Other intervertebral disc degeneration, thoracic region: Secondary | ICD-10-CM | POA: Diagnosis not present

## 2014-03-06 ENCOUNTER — Encounter: Payer: Self-pay | Admitting: Internal Medicine

## 2014-03-21 ENCOUNTER — Telehealth: Payer: Self-pay | Admitting: Internal Medicine

## 2014-03-21 NOTE — Telephone Encounter (Signed)
Needs Linzess samples. Pt is aware of her OV with RMR ONLY in April

## 2014-03-22 NOTE — Telephone Encounter (Signed)
Samples are at the front desk. I tried to call her cell number- NA and cannot leave a message. If pt calls back, please let her know that her samples are ready.

## 2014-03-26 ENCOUNTER — Other Ambulatory Visit: Payer: Self-pay

## 2014-03-26 DIAGNOSIS — D509 Iron deficiency anemia, unspecified: Secondary | ICD-10-CM

## 2014-03-27 DIAGNOSIS — E039 Hypothyroidism, unspecified: Secondary | ICD-10-CM | POA: Diagnosis not present

## 2014-03-27 DIAGNOSIS — J45909 Unspecified asthma, uncomplicated: Secondary | ICD-10-CM | POA: Diagnosis not present

## 2014-03-27 DIAGNOSIS — F3132 Bipolar disorder, current episode depressed, moderate: Secondary | ICD-10-CM | POA: Diagnosis not present

## 2014-03-27 DIAGNOSIS — M66242 Spontaneous rupture of extensor tendons, left hand: Secondary | ICD-10-CM | POA: Diagnosis not present

## 2014-03-27 DIAGNOSIS — I10 Essential (primary) hypertension: Secondary | ICD-10-CM | POA: Diagnosis not present

## 2014-04-02 DIAGNOSIS — H3532 Exudative age-related macular degeneration: Secondary | ICD-10-CM | POA: Diagnosis not present

## 2014-04-10 DIAGNOSIS — M66242 Spontaneous rupture of extensor tendons, left hand: Secondary | ICD-10-CM | POA: Diagnosis not present

## 2014-04-17 DIAGNOSIS — M2550 Pain in unspecified joint: Secondary | ICD-10-CM | POA: Diagnosis not present

## 2014-04-17 DIAGNOSIS — M1990 Unspecified osteoarthritis, unspecified site: Secondary | ICD-10-CM | POA: Diagnosis not present

## 2014-04-17 DIAGNOSIS — R5383 Other fatigue: Secondary | ICD-10-CM | POA: Diagnosis not present

## 2014-04-17 DIAGNOSIS — M669 Spontaneous rupture of unspecified tendon: Secondary | ICD-10-CM | POA: Diagnosis not present

## 2014-04-17 DIAGNOSIS — M6530 Trigger finger, unspecified finger: Secondary | ICD-10-CM | POA: Diagnosis not present

## 2014-04-17 DIAGNOSIS — E039 Hypothyroidism, unspecified: Secondary | ICD-10-CM | POA: Diagnosis not present

## 2014-04-17 DIAGNOSIS — M549 Dorsalgia, unspecified: Secondary | ICD-10-CM | POA: Diagnosis not present

## 2014-05-02 DIAGNOSIS — M25342 Other instability, left hand: Secondary | ICD-10-CM | POA: Diagnosis not present

## 2014-05-02 DIAGNOSIS — M66242 Spontaneous rupture of extensor tendons, left hand: Secondary | ICD-10-CM | POA: Diagnosis not present

## 2014-05-08 ENCOUNTER — Ambulatory Visit: Payer: Medicare HMO | Admitting: Internal Medicine

## 2014-05-11 DIAGNOSIS — M66242 Spontaneous rupture of extensor tendons, left hand: Secondary | ICD-10-CM | POA: Diagnosis not present

## 2014-05-11 DIAGNOSIS — Z4789 Encounter for other orthopedic aftercare: Secondary | ICD-10-CM | POA: Diagnosis not present

## 2014-05-21 DIAGNOSIS — H3532 Exudative age-related macular degeneration: Secondary | ICD-10-CM | POA: Diagnosis not present

## 2014-05-24 DIAGNOSIS — M66242 Spontaneous rupture of extensor tendons, left hand: Secondary | ICD-10-CM | POA: Diagnosis not present

## 2014-05-25 NOTE — H&P (Signed)
PATIENT NAME:  Heidi Fuentes, Heidi Fuentes MR#:  [id number] DATE OF BIRTH:  03-Oct-1945  DATE OF ADMISSION:  05/31/2012  IDENTIFYING INFORMATION AND CHIEF COMPLAINT:  The patient is a 69 year old woman transferred from the medical service because of mania.   CHIEF COMPLAINT:  "I thought you were hanged."   HISTORY OF PRESENT ILLNESS:  This 69 year old woman was brought to the hospital after being found confused and with altered mental status by family.  She appeared to have had a fairly acute decline in her mental state.  The patient's acute precipitant was unclear, although there is some history suggesting she had been taken off of antipsychotics in the last few weeks.  When the patient came to the Emergency Room there was concern that she may be having a delirium from a medical condition and she was admitted to the medical floor.  Urinary tract infection was identified and treated, but no other acute neurologic problem was identified and it was clear that the patient was suffering from a manic episode.  There was no evidence of recent substance abuse.  The patient appeared to have been treated most recently with Prozac 20 mg a day.  No evidence of recent antipsychotics.  The patient was not able to give any coherent history whatsoever.   SOCIAL HISTORY:  Evidently, she normally lives independently, has some children in the area who check up on her.  At baseline, seems to be able to function adequately.  The patient is not married.  Not working.   PAST MEDICAL HISTORY:  Has a history of having had episodes of stiffness and catatonia in the past with some confusion as to whether these were entirely psychiatric or could be medication reactions.  I think that is still not entirely clear.  Also has a history of asthma and hypertension.  Presented with mild rhabdomyolysis which seems to be resolving, probably due to her acute mental status change that was probably accompanied by dehydration and poor self-care.   SUBSTANCE  ABUSE HISTORY:  No evidence of any acute substance abuse problems.   REVIEW OF SYSTEMS:  The patient is not able to give any history, but does not appear to be in acute pain or having any acute nausea.   CURRENT MEDICATIONS:  At the time of transfer from the medicine ward her medicines are listed as Flexeril 10 mg at bedtime, fluticasone nasal spray two sprays each nostril once a day, levothyroxine 50 mcg once a day, Flovent 2 puffs inhaled 2 times a day, Singulair 10 mg once a day, Lasix 40 mg once a day, omeprazole 20 mg once a day, ProAir 2 puffs 4 times a day as needed, lisinopril 20 mg once a day, Seroquel 100 mg once a day at bedtime which I had started, metoprolol 25 mg twice a day.   ALLERGIES:  CELEBREX, MORPHINE, NEURONTIN AND SULFA DRUGS.   MENTAL STATUS EXAMINATION:  Adequately groomed woman, looks her stated age or older.  Not really appropriately cooperative.  Eye contact is intermittent.  Psychomotor activity is labile, ranging from withdrawn to suddenly agitated.  Speech is rapid, pressured, disorganized.  Thoughts are extremely disorganized, characterized by word salad almost, clang associations, nonsensical statements, flight of ideas.  Not able to acutely answer questions about suicidal or homicidal ideation, although she is not displaying any behavior suggesting dangerousness to herself or threats to herself.  She appeared at times to be responding to internal stimuli.  Not able to answer questions about hallucinations.  Poor insight and  judgment.  Unable to establish baseline intellectual functioning.   PHYSICAL EXAMINATION: SKIN:  No acute distress noted.  No skin lesions.  HEENT:  Pupils equal and reactive.  Appears to have full range of motion and normal strength throughout, although testing is limited by her poor cooperation.  Appears to have normal gait when she starts walking.  LUNGS:  Clear without any wheezes.  HEART:  Regular rate and rhythm.  ABDOMEN:  Soft, nontender,  normal bowel sounds.  VITAL SIGNS:  Temperature 95.3, pulse 123, respirations 18, blood pressure 118/83.   LABORATORY RESULTS:  Most recent CK 512, which is a decline from when she first presented.  Magnesium normal at 2.  Chemistries show slightly elevated glucose 117, creatinine low at 0.5.  CBC is all normal.  Lithium was undetected.   ASSESSMENT:  A 69 year old woman with a history of bipolar disorder frequently presenting with mania with psychotic features, recently has not been on any mood stabilizers, presents with symptoms of agitation, disorganized thinking, hyperverbality, flight of ideas, agitated behavior, poor self-care, all consistent with psychotic mania.  She had a urinary tract infection identified on medicine which has already been treated with IV antibiotics and is probably not a cause of her mental status changes.   TREATMENT PLAN:  I started her on Seroquel with the plan to gradually increase that.  Hopefully, she will be compliant with medicine.  If not we can use forced medicines on the grounds of her confusion, agitation, inability to understand and inability to get better without medicines.  She will be put on one-to-one precautions at this time at the suggestion of nursing because of her disorganized behavior.  We will monitor her and continue to try and work on improvement and get collateral history from her family.   DIAGNOSIS, PRINCIPAL AND PRIMARY:  AXIS I:  Bipolar disorder type I, manic.   SECONDARY DIAGNOSES: AXIS I:  No further.  AXIS II:  Deferred.  AXIS III:  Hypertension, asthma, chronic obstructive pulmonary disease.  AXIS IV:  Severe from acute illness.  AXIS V:  Functioning at time of evaluation 30.     ____________________________ Gonzella Lex, MD jtc:ea D: 06/01/2012 00:22:22 ET T: 06/01/2012 00:32:19 ET JOB#: [id number]  cc: Gonzella Lex, MD, <Dictator> Gonzella Lex MD ELECTRONICALLY SIGNED 06/01/2012 18:51

## 2014-05-25 NOTE — Discharge Summary (Signed)
PATIENT NAME:  Heidi Fuentes, BRUNING MR#:  [id number] DATE OF BIRTH:  Mar 25, 1945  DATE OF ADMISSION:  05/29/2012 DATE OF DISCHARGE:  05/31/2012  DISCHARGE DIAGNOSES: 1.  Bipolar disorder with manic psychotic episodes.  2.  Hypertension.  3.  Mild rhabdomyolysis.   CONSULTATIONS: Dr. Weber Cooks of psychiatry.   IMAGING STUDIES: Included a CT scan of the head without contrast which showed no acute abnormalities.   ADMITTING HISTORY AND PHYSICAL AND HOSPITAL COURSE: Please see detailed H and P dictated by Dr. Bridgette Habermann on 05/29/2012. In brief, a 69 year old Caucasian female patient with multiple psychiatry problems presented to the hospital brought in by family for altered mental status and confusion. The patient was admitted to the hospitalist service after being found to have mild rhabdomyolysis for IV hydration. Psychiatry was consulted for the same, was found to have bipolar disorder with manic psychotic episodes, was thought to be a candidate for inpatient psychiatry on the [facility] and was accepted by Dr. Weber Cooks and is being transferred down. The patient did have elevated blood pressure during the hospital stay, has been started on lisinopril and her home medications continued.   On the day of discharge, the patient's temperature is 97.9, blood pressure 120/68 and cardiac examination was normal with S1, S2.   DISCHARGE MEDICATIONS: 1.  Flexeril 10 mg oral once a day at bedtime.  2.  Fluticasone nasal 2 sprays each nostril once a day.  3.  Levothyroxine 50 mcg oral once a day.  4.  Flovent HFA 2 puffs inhaled 2 times a day.  5.  Montelukast 10 mg oral once a day.  6.  Lasix 40 mg oral once a day.  7.  Omeprazole 20 mg oral once a day.  8.  ProAir HFA 2 puffs inhaled 4 times a day as needed for shortness of breath or wheezing.  9.  Acetaminophen 650 mg oral every 4 hours as needed for pain and fever.  10.  Lisinopril 20 mg oral once a day.  11.  Olanzapine 5 mg intramuscular every 4 to  6 hours as needed for agitation.  12.  Seroquel 100 mg oral once a day at bedtime.  13.  Lorazepam 2 mg intramuscular every 4 hours as needed for agitation.  14.  Metoprolol tartrate 25 mg oral 2 times a day.  15.  Lorazepam 2 mg oral every 4 hours as needed for agitation.   TRANSFER INSTRUCTIONS: The patient will be on a low-sodium diet. Activity as tolerated. Follow up with psychiatry physician in the [facility].   TIME SPENT: On day of discharge in discharge activity and patient care was 48 minutes.   ____________________________ Leia Alf Vina Byrd, MD srs:cs D: 05/31/2012 13:24:00 ET T: 05/31/2012 15:27:18 ET JOB#: [id number]  cc: Alveta Heimlich R. Meridith Romick, MD, <Dictator> Neita Carp MD ELECTRONICALLY SIGNED 06/15/2012 13:52

## 2014-05-25 NOTE — Consult Note (Signed)
Brief Consult Note: Diagnosis: bipolar disorder psychotic manic.   Patient was seen by consultant.   Recommend further assessment or treatment.   Orders entered.   Comments: Psychiatry: Patient seen. Patient with long history of bipolar disorder. Patient currently wide awake and "wide open" with constant talk, flight of idea, clang associations. Can't really assess specifically her level of dementia but from her vocabulary and ability to make jokes I'd think she is much more psychotic than manic. In any case I have put her on a starter dose of Seroquel '100mg'$  tonight and prn ativan '2mg'$  q4 either po or IM. I will be ready to transfer her to [facility] tomorrow. Tonight we are in a tight position with short nursing staff for admissions, so waiting till tomorrow am will be better.  Electronic Signatures: Murrel Bertram, Madie Reno (MD)  (Signed 28-Apr-14 21:42)  Authored: Brief Consult Note   Last Updated: 28-Apr-14 21:42 by Gonzella Lex (MD)

## 2014-05-25 NOTE — Consult Note (Signed)
PATIENT NAME:  Heidi Fuentes, Heidi Fuentes MR#:  [id number] DATE OF BIRTH:  Mar 14, 1945  DATE OF CONSULTATION:  05/29/2012  REQUESTING PHYSICIAN:  Dr. Ferman Hamming  CONSULTING PHYSICIAN:  Cordelia Pen. Gretel Acre, MD  REASON FOR CONSULT:  Altered mental status.   HISTORY OF PRESENT ILLNESS: The patient is a 69 year old Caucasian female who was brought to the ED for altered mental status and confusion and bizarre behavior. The patient has a long history of bipolar disorder with psychosis, and she was normal approximately 4 days ago. Her brother went to check on her, and found that patient has been becoming more confused recently. The patient was initially evaluated by Dr. Bridgette Habermann and he noted that patient is  becoming more catatonic and is clenching her teeth and her speech was somewhat scanning. He requested a psychiatric consult because of her previous history of bipolar disorder, and has been seen by Psychiatry multiple times in her past admissions in the hospital.   During my interview, patient was noted to be somewhat catatonic and was clenching her hands and her teeth were very clenched as well. She was repeating her words. She reported that she was not taking any medications at this time. She stated that she lives with her son, and it was difficult for her to tell me the name of her son. She initially told me that she is only 69 years old, but later she mentioned that she is 9. She also was able to tell me the telephone number for her son as  Doren Custard. However, he walked into the room and was able to provide me collateral information. He reported that patient is currently following with 2 physicians, who are basically her primary care physicians. He reported that patient is here with her medication bottles. Review of her medications indicated that she is only currently taking Prozac 20 mg in the morning for her depression. He reported that her physician has recently changed several of her medications to control her  medical problems. He stated that her PA has stopped her Risperdal approximately 2 months ago, as he has told her that she does not have any history of bipolar disorder. He also called the other brother, as patient started becoming more agitated and was yelling in the ED. The other brother also joined this for the interview. He provided collateral information. He reported that the patient has never been on any long-acting psychotropic  injections in the past. He stated that he does not remember any other medications which the patient has taken. Reported that he thinks that patient might be on one medication which has accumulated in her body and has become toxic. He does not remember the name of the medication. He stated that patient has been becoming progressively worse for the past few days, and she is now becoming agitated and is not able to control her behavior. They were concerned about her condition, so that is why they decided to bring her over here.   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 She was last admitted here in 2010. She has history of dystonic reaction to Geodon, and then she responded well to Benadryl at that time. She has also been given Lamictal as well as Valium in the past. She was given IV Ativan that made her feel drunk in the past. She was seen by Dr. Bary Leriche in the past.   PAST MEDICAL HISTORY: 1.  History of seizure related to low potassium  level.  2.  Hypertension.  3.  Hypothyroidism.  4.  Fibromyalgia.  5.  Asthma.  6.  Degenerative disk disease.  7.  Irritable bowel syndrome.  8.  History of possible TIAs. 9.  Esophageal dysmotility. 10.  Obstructive mild sleep apnea. 11.  Allergic rhinitis.   FAMILY HISTORY:  Remarkable for heart disease and cancer.   SOCIAL HISTORY: The patient is currently divorced and is living by herself. She has 2 sons, one of whom might have the power of  attorney. She is currently retired. She does not have any history of smoking, drinking or using illicit substances.   REVIEW OF SYSTEMS:  Unavailable due to poor patient cooperation and not available at this time.   CURRENT MEDICATIONS: Cyclobenzaprine 10 mg p.o. daily. Fluticasone nasal spray 2 sprays in each nostril once a day. Levothyroxine 50 mcg daily. Flovent inhaler 44 mcg 2 puffs inhaled 2 times daily. Fluoxetine 20 mg once a day. Singular 10 mg once a day in the evening.  Furosemide 40 mg daily. Metoprolol 50 mg daily. Omeprazole 20 mg daily. Cetirizine 10 mg at bedtime. ProAir HFA 90 mcg per inhalation 4 times a day for shortness of breath.   MENTAL STATUS EXAMINATION: The patient is a short-statured female who appeared her stated age. She was very angry, agitated, loud during the interview. She maintained poor eye contact. Her mood was angry and anxious. Affect was congruent. She was unable to cooperate, and was trying to jump out of her bed. She kept her arms and legs in a clenched position. She has poor impulse control. She did not exhibit any suicidal or homicidal ideations or plans.   DIAGNOSTIC IMPRESSION: AXIS I: 1.  Severe dystonia.  2.  History of bipolar disorder.   TREATMENT PLAN:   1.  I discussed her case at length with Dr. Bridgette Habermann as well as with Dr. Benjaman Lobe.  2.  I will start her on Zyprexa 10 mg IM q. 4 to 6 hours p.r.n. for agitation.  3.  She will also be given Benadryl 50 mg p.o. q. 6 hours p.r.n. for agitation.  4.  I discussed about the possibility of having a reaction to Singulair, which can also cause a psychotic-like episode in some patients, as her medications do not indicate that she is currently on any psychotropic medication besides fluoxetine. Dr. Liliane Shi will adjust her medications. However, her urine drug screen is positive for tricyclic antidepressants and opiates at this time. Will monitor the patient once she will get admitted to the medical floor for  hydration at this time.   Thank you for allowing me to participate in the care of this patient.    ____________________________ Cordelia Pen. Gretel Acre, MD usf:mr D: 05/29/2012 18:47:45 ET T: 05/29/2012 19:02:30 ET JOB#: [id number]  cc: Cordelia Pen. Gretel Acre, MD, <Dictator> Jeronimo Norma MD ELECTRONICALLY SIGNED 05/30/2012 10:40

## 2014-05-25 NOTE — Consult Note (Signed)
PATIENT NAME:  Heidi Fuentes, Heidi Fuentes MR#:  [id number] DATE OF BIRTH:  1945/08/09  DATE OF CONSULTATION:  05/30/2012  CONSULTING PHYSICIAN:  Gonzella Lex, MD  IDENTIFYING INFORMATION AND REASON FOR CONSULT: The patient is a 69 year old woman with a history of bipolar disorder who was admitted to the hospital with confusion and possibly some stiffness. Follow-up consult is for psychiatric management.   HISTORY OF PRESENT ILLNESS: Dr. Anola Gurney note from yesterday summarizes this well. The patient recently was found to have compensated in her mental state. There seems to be some suggestion that she was taken off of her antipsychotics recently, although it may be that she was simply noncompliant or not following up with psychiatry. On examination today, the patient is not able to offer any coherent history.   PAST PSYCHIATRIC HISTORY: Long history of bipolar disorder. Multiple psychiatric admissions and psychiatric evaluations. She has been treated with combinations of mood stabilizers and antipsychotics in the past. She was last seen in our hospital about 4 years ago, and at the time her presentation was confusing with an emphasis on stiffness and a possibility of seizure activity versus dystonia. In the end, it probably seemed like it was mostly her mania.   PAST MEDICAL HISTORY:  1.  Hypothyroidism. 2.  Hypertension.  3.  History of sleep apnea. 4.  Asthma.  6.  Degenerative disk disease.  7.  Possible seizure disorder when she was having electrolyte abnormalities in the past.   REVIEW OF SYSTEMS: The patient is not able to offer any meaningful answers about that.   MENTAL STATUS EXAMINATION: The patient is an elderly woman interviewed in her hospital bed. She was talking nonstop when I came in the door, not addressing her words to anyone. She was able to pay attention to the fact that I was in the room, made some eye contact, but continued to speak at a rapid rate with little regard for what I was  saying. She frequently had clang associations and would repeat things I said to her or make rhyming puns on them. Her affect was mostly euphoric, smiling, agitated, labile. She was not tearful, did not appear to be frightened or upset. On a couple of occasion, she grabbed at nurses who came too close to her but did not seem to be hostile as much as playful and mischievous. She did not appear to be attempting to harm herself. No sign of suicidality. No sign of homicidality. She was not able to answer any coherent questions, so it is not clear whether she might be having hallucinations. Speech was pressured and rapid. From what I am told, she has been able to stand and walk around the room.   LABORATORY RESULTS: I see that when she came in she had a drug screen positive for tricyclics, opiates, and not for benzodiazepines. Her potassium was normal. Electrolytes and renal function appeared to be normal. TSH normal at 4.3. Lithium level low at less than 0.2, which is the equivalent of undetected. CBC entirely normal.   ASSESSMENT: A 69 year old woman with a history of bipolar disorder, presented with confusing altered mental status, but today on re-evaluation everything about her seems to be manic and psychotic. She is not able to be cooperative enough to assess her level of cognition, but I do not think that this is probably dementia. She did have a urinary tract infection which has been treated but no other clear reason to be delirious. I think most likely this all represents psychiatric illness.  TREATMENT PLAN: I am going to start her on 100 mg of Seroquel p.o. tonight and hope that she will take the pills. Dr. Gretel Acre had put her on p.r.n. doses of IM Zyprexa, which she has received several times today and has probably been responsible for her being actually a little bit better today than she was yesterday. I will put in orders, however, also for her to get Ativan q. 4 hours  either by p.o. or IM 2 mg. I am  happy to transfer her to psychiatry tomorrow morning. Tonight we are short-staffed with no nurse on for psychiatric admissions, so if she can stay on the medicine service overnight it would be helpful. This would probably be particularly appropriate since she is still n.p.o., and I think it is still not clear how stable she is on her feet.   DIAGNOSIS, PRINCIPAL AND PRIMARY:  AXIS I: Bipolar disorder, manic and psychotic.   SECONDARY DIAGNOSES: AXIS I: Rule out delirium.   AXIS II: Deferred.   AXIS III: Urinary tract infection.   AXIS IV: Moderate chronic stress from burden of illness.   AXIS V: Functioning at time of admission: 25.   ____________________________ Gonzella Lex, MD jtc:cb D: 05/30/2012 21:51:31 ET T: 05/30/2012 22:31:06 ET JOB#: [id number]  cc: Gonzella Lex, MD, <Dictator> Gonzella Lex MD ELECTRONICALLY SIGNED 05/30/2012 22:59

## 2014-05-25 NOTE — H&P (Signed)
PATIENT NAME:  Heidi Fuentes, Heidi Fuentes MR#:  [id number] DATE OF BIRTH:  [birth year]  DATE OF ADMISSION:  05/29/2012  PRIMARY CARE PHYSICIAN: Unclear PRIMARY PSYCHIATRIST: Unclear, but previously was Dr. Rosine Door  REFERRING PHYSICIAN: Ferman Hamming, MD    CHIEF COMPLAINT: Brought in by family for altered mental status and confusion, bizarre behavior.   HISTORY OF PRESENT ILLNESS: The patient is a 69 year old Caucasian female with multiple medical issues including bipolar with psychosis, history of seizures with decreased potassium level, multiple medical issues including catatonia and dystonia in the past, who was brought in by her family for bizarre behavior. The patient currently is in the room by herself, and there is no family present; and I contacted several numbers in the chart but was unable to get in touch with anybody but left message. Per ER staff, she has had several family members deceased recently including a sister and an aunt and another family member. She was last seen normal 4 days ago, and a brother went to check on her today and was noted to have bizarre behavior and was brought in here. Here, she has a positive u-tox and some mild rhabdomyolysis with CK total of 891. The patient states she is on opiates, but it is not in her medication bottles which are here in the room.  Furthermore, the patient has a positive UA for tricyclics. Hospitalist Services were contacted for further evaluation and management.   PAST MEDICAL AND SURGICAL HISTORY:  1.  History of bipolar disorder with psychosis, dystonia and catatonia in the past. 2.  History of seizure disorder with decreased potassium level.  3.  Hypothyroidism.  4.  Hypertension.  5.  Esophageal dysmotility.  6.  Fibromyalgia.  7.  Mild obstructive sleep apnea.  8.  Asthma.  9.  Allergic rhinitis.  10.  History of DJD.  11.  History of IBS.  12.  History of TIA.  13.  History of ERCP x 2 with sphincterectomy.  14.  Elevated liver enzyme  history.  15.  Cataract repair.  16.  Oral thrush history.  17.  History of coccyx surgery.   ALLERGIES: CELEBREX, MORPHINE, NEURONTIN AND SULFA.   CURRENT MEDICATIONS: Per med rec: Cetirizine 10 mg as needed, cyclobenzaprine 10 mg once a day at bedtime, Flovent 2 puffs 2 times a day, fluoxetine 20 mg daily, fluticasone 15 mcg nasal spray, 2 sprays once a day in each nostril, Lasix 40 mg daily, levothyroxine 50 mcg daily, metoprolol succinate 50 mg daily, montelukast 10 mg daily, omeprazole 20 mg daily and ProAir  p.r.n.   FAMILY HISTORY: Unable to obtain, but per chart history of heart disease and cancer.   SOCIAL HISTORY: Unable to obtain, but per chart previously no smoking or alcohol abuse.   REVIEW OF SYSTEMS: Unable to obtain fully, but the patient is very tangential, moves from subject to subject, and has echolalia but denies having any fever, states that she has had several falls recently and has some dysuria.   PHYSICAL EXAMINATION: VITAL SIGNS: Temperature on arrival 97.7, pulse rate 96, respiratory rate 18, blood pressure 162/74, O2 sat 95% on room air.  GENERAL: The patient is an obese Caucasian female lying in bed, awake, agitated and currently very labile.  HEENT: Appears to be normocephalic and atraumatic. Pupils are equal and reactive. Very dry mucous membranes. Anicteric sclerae.  NECK: Supple. No thyroid tenderness.  CARDIOVASCULAR: S1, S2 regular rate and rhythm. No murmurs, rubs or gallops.  LUNGS: Very poor effort unable to auscultate.  ABDOMEN: Appears to be soft, nontender, nondistended. Positive bowel sounds in all quadrants. No guarding.  EXTREMITIES: No significant lower extremity edema.  NEUROLOGICAL:  Unable to do full neuro exam as the patient is very agitated and noncompliant with history but appears to have some catatonia of the upper extremities without any rigidity.  Face appears to be midline. Pupils are equal.  The patient moves extremities sometimes but  does not want to move them with significant rigidity other times.  PSYCHIATRIC: Pressured speech, tangential with echolalia, with some agitation.  LABORATORY AND RADIOLOGICAL DATA:  BUN 16, creatinine 0.98, sodium 137, potassium 3.8. Serum CO2 31. LFTs: AST was 67, otherwise within normal limits. CK total 891, CK-MB 10.2. Troponin negative. U-tox positive for opiates and tricyclics. WBC 6.6, hemoglobin 12.9, platelets 237. INR is 0.9. PT is 12.6. UA: 1+ blood, 3+ leukocyte esterase, 11 RBC, 76 WBC. Acetaminophen level and salicylate level are not elevated.   CT of head without contrast showing no acute intracranial process. Chest x-ray, PA and lateral, showing no acute disease of the chest.   ASSESSMENT AND PLAN: We have a 69 year old female with multiple medical issues and extensive psychiatric history with admissions to psychiatry on several occasions who presents with altered mental status, bizarre behavior per ER staff and not being herself, positive UA and u-tox. Altered mental status could be combination of substance abuse as well as psychiatric issues and possible contributing is UTI. We would obtain a psych consult, and the case has been discussed with the psychiatrist who would see her. The patient has positive opiates and tricyclics, but she does not have any bottles in those categories with her. It is possible those are contributing. Furthermore, the patient has a very tangential speech pattern, pressured speech and echolalia. I believe she has underlying psych issues which are uncontrolled in the setting of recent deaths in the family and possibly ingesting substances. We would start her on some IV fluids. A CT of the head is negative for strokes. We would treat the UTI with ceftriaxone and send in urine cultures. We would hold nonessential medications, check a TSH and resume thyroid replacement. The patient has mild rhabdomyolysis. We would trend the CK totals and start some gentle fluids. She  does not appear to be labored or short of breath or tachypneic. We would resume her Flovent and add Albuterol nebs p.r.n. We  would also place a sitter. Start her on heparin for DVT.   CODE STATUS: The patient is FULL CODE.   TOTAL TIME SPENT: 65 minutes.         The case was discussed with the ER physician and psychiatrist, Dr. Gretel Acre.  ____________________________ Vivien Presto, MD sa:cb D: 05/29/2012 16:44:50 ET T: 05/29/2012 17:09:49 ET JOB#: [id number]  cc: Vivien Presto, MD, <Dictator> Vivien Presto MD ELECTRONICALLY SIGNED 06/27/2012 13:54

## 2014-05-25 NOTE — Discharge Summary (Signed)
PATIENT NAME:  Heidi Fuentes, Heidi Fuentes MR#:  [id number] DATE OF BIRTH:  1945-12-28  DATE OF ADMISSION:  05/31/2012 DATE OF DISCHARGE:  06/11/2012  HOSPITAL COURSE: See dictated history and physical for details of admission. This 69 year old woman with a history of bipolar disorder was brought into the hospital with mental status changes initially thought to possibly be medical in nature, but it turned out that they were more likely a reflection of acute psychosis. I did a consultation on her on the medical floor and found her to be manic in her thinking, grossly disorganized in her thinking, hyperactive and unable to focus enough to take care of herself. The patient was transferred to the psychiatric ward. Seroquel has been gradually increased, and she has tolerated it well. She has psychiatrically shown gradual improvement. She continued to show some signs of mania for much of her course and continued to have only passing understanding of her illness but did gradually improve and became more coherent in her thinking. She became able to take care of her basic ADLs.  She was not aggressive and did not show any suicidal behavior. Medically, she continued to complain of chronic pain which was somewhat controlled with low-dose daily Percocet. Otherwise medically, she seemed to stabilize quite well. At the time of discharge, she was taking 400 mg of Seroquel at night as the primary psychiatric treatment. She was hesitant at first to consider compliance with this but was open to education and eventually did agree to stay on her medicine. She is referred to follow up with [facility]. Her sons are aware of her history of mental illness and are agreeable to the plans for discharge.   DISCHARGE MEDICATIONS: Flexeril 10 mg at night, Flonase nasal spray 2 sprays both nostrils daily, Lasix 40 mg per day, Synthroid 50 mcg per day, metoprolol 25 mg twice a day, Singulair 10 mg per day, Prilosec 20 mg in the morning, quetiapine 400 mg at  night, Flonase inhaler 2 puffs b.i.d. with spacer, Percocet 5 mg 1 twice a day as needed for pain.   LABORATORY RESULTS: Labs done initially coming into the hospital to medical showed positive drug screen for tricyclics and opiates. CK elevated at 891. Chemistry is otherwise largely normal, slightly elevated AST at 67. TSH is normal at 4.3. Lithium level low, but that is to be expected given that she was not taking lithium at the time. CBC was normal. Labs here on the psychiatry unit continue to show fairly normal chemistries, slightly elevated creatinine 1.38, normal CBC.   MENTAL STATUS EXAM AT DISCHARGE: Casually dressed, neatly groomed woman, looks her stated age, cooperative with the interview. Good eye contact, normal psychomotor activity. Affect still slightly irritable, but not angry or rageful and appropriately controlled. Mood stated as being fine. Thoughts appear to be more lucid and organized. No evidence of grossly bizarre or delusional thinking. Denies auditory or visual hallucinations. Denies any suicidal or homicidal ideation. Shows improved judgment and insight, normal intelligence.   DISPOSITION: Discharged to her own home. Follow up with [facility] on current medicine.   DIAGNOSIS, PRINCIPAL AND PRIMARY:  AXIS I: Bipolar disorder, type 1, most recent episode manic.   SECONDARY DIAGNOSES: AXIS I: No further.   AXIS II:  No diagnosis.   AXIS III:  1.  Chronic pain.  2.  Chronic obstructive pulmonary disease.  3.  Gastric reflux symptoms.  4.  Hypertension.  5.  Hypothyroidism.   AXIS IV: Severe from living alone and burden of  chronic illness.   AXIS V: Functioning at time of discharge 55.   ____________________________ Gonzella Lex, MD jtc:cb D: 06/11/2012 19:16:31 ET T: 06/12/2012 17:57:15 ET JOB#: [id number]  cc: Gonzella Lex, MD, <Dictator> Gonzella Lex MD ELECTRONICALLY SIGNED 06/12/2012 21:52

## 2014-06-07 DIAGNOSIS — M66242 Spontaneous rupture of extensor tendons, left hand: Secondary | ICD-10-CM | POA: Diagnosis not present

## 2014-06-07 DIAGNOSIS — Z4789 Encounter for other orthopedic aftercare: Secondary | ICD-10-CM | POA: Diagnosis not present

## 2014-06-12 ENCOUNTER — Encounter: Payer: Self-pay | Admitting: Internal Medicine

## 2014-06-12 ENCOUNTER — Ambulatory Visit (INDEPENDENT_AMBULATORY_CARE_PROVIDER_SITE_OTHER): Payer: Medicare Other | Admitting: Internal Medicine

## 2014-06-12 ENCOUNTER — Other Ambulatory Visit: Payer: Self-pay

## 2014-06-12 VITALS — BP 124/68 | HR 80 | Temp 97.3°F | Ht 60.0 in | Wt 142.8 lb

## 2014-06-12 DIAGNOSIS — K5900 Constipation, unspecified: Secondary | ICD-10-CM

## 2014-06-12 DIAGNOSIS — K219 Gastro-esophageal reflux disease without esophagitis: Secondary | ICD-10-CM | POA: Diagnosis not present

## 2014-06-12 DIAGNOSIS — R1314 Dysphagia, pharyngoesophageal phase: Secondary | ICD-10-CM

## 2014-06-12 NOTE — Patient Instructions (Signed)
Constipation information provided  Continue Miralax twice daily  Use Linzess 290 daily when available  Use ducolax when out of Linzess  Barium pill esophogram to evaluate dysphagia  Further recommendations to follow

## 2014-06-12 NOTE — Progress Notes (Signed)
Primary Care Physician:  Abigail Miyamoto, MD Primary Gastroenterologist:  Dr. Gala Romney  Pre-Procedure History & Physical: HPI:  Heidi Fuentes is a 69 y.o. female here for  followup of constipation and GERD/dysphagia. States some recurrent esophageal dysphagia to solids. Has nocturnal reflux symptoms on a fairly regular basis. Dilation of her Schatzki ring helped in August of last year but symptoms have insidiously recurred. Linzess /  MiraLax worked great for constipation, however,  she can't afford linzess and has had to revert back to OTC laxatives..  Past Medical History  Diagnosis Date  . Fibromyalgia   . Depression   . Thyroid disease   . Back pain   . Asthma   . Chronic pain syndrome 07/10/2013  . Duodenal papillary stenosis   . Macular degeneration   . Glaucoma   . 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. Hypothyroidism   . GERD (gastroesophageal reflux disease)   . Hypertension   . Tubular adenoma     Past Surgical History  Procedure Laterality Date  . Tonsillectomy    . Hemorroidectomy    . Hernia repair    . Coccyx removal    . Colonoscopy  2005    Dr. Laural Golden: mild melanosis coli, otherwise normal  . Ercp  1997    [facility]: biliary manometry abnormal, subsequent sphincterotomy   . Foot surgery      X2  . Colonoscopy with propofol N/A 09/14/2013    WUJ:[id number] coli. Colonic diverticulosis. Single colonic. Tubular adenoma. Next TCS 09/2020.  [name] Esophagogastroduodenoscopy (egd) with propofol N/A 09/14/2013    [id number] ring. Hiatal hernia. Status post Venia Minks and biopsy disruption.   Venia Minks dilation N/A 09/14/2013    Procedure: Venia Minks DILATION;  Surgeon: [name], MD;  Location: [facility];  Service: Endoscopy;  Laterality: N/A;  56  . Polypectomy N/A 09/14/2013    Procedure: POLYPECTOMY;  Surgeon: [name], MD;  Location: [facility];  Service: Endoscopy;  Laterality: N/A;    Prior to Admission medications   Medication Sig Start Date End Date Taking?  Authorizing Provider  acetaminophen (TYLENOL) 500 MG tablet Take 500 mg by mouth every 6 (six) hours as needed. For pain    Yes Historical Provider, MD  albuterol (VENTOLIN HFA) 108 (90 BASE) MCG/ACT inhaler Inhale 2 puffs into the lungs every 6 (six) hours as needed. For shortness of breath    Yes Historical Provider, MD  cyclobenzaprine (FLEXERIL) 10 MG tablet Take 10 mg by mouth at bedtime.   Yes Historical Provider, MD  FLUoxetine (PROZAC) 20 MG tablet Take 20 mg by mouth daily.   Yes Historical Provider, MD  furosemide (LASIX) 40 MG tablet Take 40 mg by mouth daily. 04/18/13  Yes Historical Provider, MD  HYDROcodone-acetaminophen (NORCO) 10-325 MG per tablet Take 1 tablet by mouth every 4 (four) hours as needed. pain 06/15/13  Yes Historical Provider, MD  lamoTRIgine (LAMICTAL) 100 MG tablet Take 150 mg by mouth 2 (two) times daily.    Yes Historical Provider, MD  latanoprost (XALATAN) 0.005 % ophthalmic solution Place 1 drop into both eyes at bedtime. 04/20/13  Yes Historical Provider, MD  levothyroxine (SYNTHROID, LEVOTHROID) 50 MCG tablet Take 50 mcg by mouth daily.     Yes Historical Provider, MD  Linaclotide (LINZESS) 290 MCG CAPS capsule Take 1 capsule (290 mcg total) by mouth daily. 12/15/13  Yes [name], PA-C  metoprolol succinate (TOPROL-XL) 50 MG 24 hr tablet Take 50 mg by mouth daily.  04/18/13  Yes Historical Provider, MD  montelukast (SINGULAIR) 10 MG tablet Take 10 mg by mouth daily. 05/03/13  Yes Historical Provider, MD  morphine (MS CONTIN) 30 MG 12 hr tablet Take 30 mg by mouth every 12 (twelve) hours. 06/15/13  Yes Historical Provider, MD  Multiple Vitamins-Minerals (PRESERVISION/LUTEIN PO) Take 1 tablet by mouth daily.   Yes Historical Provider, MD  omeprazole (PRILOSEC) 20 MG capsule Take 20 mg by mouth daily. 04/18/13  Yes Historical Provider, MD  polyethylene glycol (MIRALAX / GLYCOLAX) packet Take 17 g by mouth 2 (two) times daily. 07/31/13  Yes [name], NP  QUEtiapine  (SEROQUEL) 50 MG tablet Take 50 mg by mouth at bedtime. 04/18/13  Yes Historical Provider, MD  Wheat Dextrin (BENEFIBER) POWD 2 teaspoons added to food/water three times daily. Start with once daily and increase to three times daily over the next 5 days. 12/15/13  Yes [name], PA-C    Allergies as of 06/12/2014 - Review Complete 06/12/2014  Allergen Reaction Noted  . Sulfa antibiotics Rash 08/26/2010    Family History  Problem Relation Age of Onset  . Colon cancer Neg Hx   . Lung cancer Mother   . Heart attack Father   . Bone cancer Brother   . COPD Sister     History   Social History  . Marital Status: Divorced    Spouse Name: N/A  . Number of Children: N/A  . Years of Education: N/A   Occupational History  . Not on file.   Social History Main Topics  . Smoking status: Never Smoker   . Smokeless tobacco: Not on file     Comment: NEVER SMOKED  . Alcohol Use: No  . Drug Use: No  . Sexual Activity: Not on file   Other Topics Concern  . Not on file   Social History Narrative    Review of Systems: See HPI, otherwise negative ROS  Physical Exam: BP 124/68 mmHg  Pulse 80  Temp(Src) 97.3 F (36.3 C)  Ht 5' (1.524 m)  Wt 142 lb 12.8 oz (64.774 kg)  BMI 27.89 kg/m2 General:   Alert,  Well-developed, well-nourished, pleasant and cooperative in NAD Skin:  Intact without significant lesions or rashes. Eyes:  Sclera clear, no icterus.   Conjunctiva pink. Ears:  Normal auditory acuity. Nose:  No deformity, discharge,  or lesions. Mouth:  No deformity or lesions. Neck:  Supple; no masses or thyromegaly. No significant cervical adenopathy. Lungs:  Clear throughout to auscultation.   No wheezes, crackles, or rhonchi. No acute distress. Heart:  Regular rate and rhythm; no murmurs, clicks, rubs,  or gallops. Abdomen: Non-distended, normal bowel sounds.  Soft and nontender without appreciable mass or hepatosplenomegaly.  Pulses:  Normal pulses noted. Extremities:   Without clubbing or edema.  Impression:  Pleasant 69 year old lady with chronic constipation, predominantly narcotic induced, much improved with Linzess 290 and  MiraLax. However, cannot afford Linzess, dropping  back to over-the-counter laxatives. When on Linzess - does very well.  Having some breakthrough GERD symptoms on Prilosec.  Recurrent esophageal dysphagia. Known Schatzki's ring. EGD with dilation much less than one year ago. History colonic adenoma; due surveillance colonoscopy 2022.  Recommendations:  Constipation information provided  Continue Miralax twice daily  Use Linzess 290 daily when available - samples provided  Use ducolax when out of Linzess  Barium pill esophogram to evaluate dysphagia  Further recommendations to follow    Notice: This dictation was prepared with Dragon dictation  along with smaller phrase technology. Any transcriptional errors that result from this process are unintentional and may not be corrected upon review.

## 2014-06-13 DIAGNOSIS — H4011X1 Primary open-angle glaucoma, mild stage: Secondary | ICD-10-CM | POA: Diagnosis not present

## 2014-06-22 DIAGNOSIS — G894 Chronic pain syndrome: Secondary | ICD-10-CM | POA: Diagnosis not present

## 2014-06-22 DIAGNOSIS — M5134 Other intervertebral disc degeneration, thoracic region: Secondary | ICD-10-CM | POA: Diagnosis not present

## 2014-06-22 DIAGNOSIS — Z79891 Long term (current) use of opiate analgesic: Secondary | ICD-10-CM | POA: Diagnosis not present

## 2014-06-25 DIAGNOSIS — F3132 Bipolar disorder, current episode depressed, moderate: Secondary | ICD-10-CM | POA: Diagnosis not present

## 2014-06-25 DIAGNOSIS — J45909 Unspecified asthma, uncomplicated: Secondary | ICD-10-CM | POA: Diagnosis not present

## 2014-07-04 ENCOUNTER — Other Ambulatory Visit (HOSPITAL_COMMUNITY): Payer: Medicare Other

## 2014-07-05 DIAGNOSIS — Z4789 Encounter for other orthopedic aftercare: Secondary | ICD-10-CM | POA: Diagnosis not present

## 2014-07-16 DIAGNOSIS — H43813 Vitreous degeneration, bilateral: Secondary | ICD-10-CM | POA: Diagnosis not present

## 2014-07-16 DIAGNOSIS — H3532 Exudative age-related macular degeneration: Secondary | ICD-10-CM | POA: Diagnosis not present

## 2014-07-20 ENCOUNTER — Ambulatory Visit (HOSPITAL_COMMUNITY)
Admission: RE | Admit: 2014-07-20 | Discharge: 2014-07-20 | Disposition: A | Discharge status: Home / Self Care | Payer: Medicare Other | Source: Ambulatory Visit | Attending: Internal Medicine | Admitting: Internal Medicine

## 2014-07-20 DIAGNOSIS — K219 Gastro-esophageal reflux disease without esophagitis: Secondary | ICD-10-CM | POA: Insufficient documentation

## 2014-07-20 DIAGNOSIS — R1314 Dysphagia, pharyngoesophageal phase: Secondary | ICD-10-CM | POA: Diagnosis not present

## 2014-07-20 DIAGNOSIS — K449 Diaphragmatic hernia without obstruction or gangrene: Secondary | ICD-10-CM | POA: Diagnosis not present

## 2014-07-20 DIAGNOSIS — K224 Dyskinesia of esophagus: Secondary | ICD-10-CM | POA: Diagnosis not present

## 2014-07-20 NOTE — Progress Notes (Signed)
Quick Note:  No esophageal stricture or ring.  Age related diffuse esophageal dysmotility.  Moderate to large hiatal hernia. Makes her more prone to reflux.  She needs to chew food thoroughly, drink plenty of fluid during meals and with taking medications.  Recommend trial of Pepcid or Zantac OTC at bedtime to help with breakthrough symptoms.    ______

## 2014-07-27 NOTE — Progress Notes (Signed)
I tried calling pt at home and Cell number no answer LMOM. I scheduled pt for 08/24/14 at 8:30 with Dr. Gala Romney appt letter has been mailed to the pt.

## 2014-08-17 DIAGNOSIS — Z4789 Encounter for other orthopedic aftercare: Secondary | ICD-10-CM | POA: Diagnosis not present

## 2014-08-24 ENCOUNTER — Ambulatory Visit: Payer: Medicare Other | Admitting: Internal Medicine

## 2014-08-24 ENCOUNTER — Encounter: Payer: Self-pay | Admitting: Internal Medicine

## 2014-08-24 ENCOUNTER — Telehealth: Payer: Self-pay | Admitting: Internal Medicine

## 2014-08-24 NOTE — Telephone Encounter (Signed)
PATIENT WAS A NO SHOW AND LETTER SENT  °

## 2014-08-27 DIAGNOSIS — H3532 Exudative age-related macular degeneration: Secondary | ICD-10-CM | POA: Diagnosis not present

## 2014-09-25 ENCOUNTER — Encounter: Payer: Self-pay | Admitting: Internal Medicine

## 2014-09-25 ENCOUNTER — Ambulatory Visit (INDEPENDENT_AMBULATORY_CARE_PROVIDER_SITE_OTHER): Payer: Medicare Other | Admitting: Internal Medicine

## 2014-09-25 VITALS — BP 125/80 | HR 109 | Temp 98.5°F | Ht 60.0 in | Wt 138.8 lb

## 2014-09-25 DIAGNOSIS — K5900 Constipation, unspecified: Secondary | ICD-10-CM

## 2014-09-25 DIAGNOSIS — R110 Nausea: Secondary | ICD-10-CM

## 2014-09-25 DIAGNOSIS — K219 Gastro-esophageal reflux disease without esophagitis: Secondary | ICD-10-CM | POA: Diagnosis not present

## 2014-09-25 DIAGNOSIS — K449 Diaphragmatic hernia without obstruction or gangrene: Secondary | ICD-10-CM | POA: Diagnosis not present

## 2014-09-25 MED ORDER — PANTOPRAZOLE SODIUM 40 MG PO TBEC: 40.0000 mg | DELAYED_RELEASE_TABLET | Freq: Every day | ORAL | Status: DC

## 2014-09-25 NOTE — Patient Instructions (Signed)
Take 17 grams of Miralax in the evening on any given day without a BM  Our goal is a BM every other day or 3x weekly  Call me in 2 weeks and tell us how things are going  GERD and constipation information provided  Stop omeprazole (Prilosec); trial of Protonix 40 mg daily  Office visit with [place] in 2 months

## 2014-09-25 NOTE — Progress Notes (Signed)
Primary Care Physician:  Abigail Miyamoto, MD Primary Gastroenterologist:  Dr. Gala Romney  Pre-Procedure History & Physical: HPI:  Heidi Fuentes is a 69 y.o. female here for followup of GERD and chronic constipation. Last seen here 06/12/2014. States Linzess workeds were very well to constipation but could not afford it. She takes her bradycardia over-the-counter agents including Correctol milk of magnesia and a day: Soft. Typically states she waits for 5 days without having a bowel movement before trying MiraLax. She has diarrhea. Intermittent nausea and reflux symptoms the setting of a large hiatal hernia. No esophageal obstruction recent barium pill esophagram.  Takes omeprazole 20 mg daily. Has breakthrough symptoms. History of Schatzki's ring dilated previously with no evidence of same on recent barium pill esophagram.  Past Medical History  Diagnosis Date  . Fibromyalgia   . Depression   . Thyroid disease   . Back pain   . Asthma   . Chronic pain syndrome 07/10/2013  . Duodenal papillary stenosis   . Macular degeneration   . Glaucoma   . 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. Hypothyroidism   . GERD (gastroesophageal reflux disease)   . Hypertension   . Tubular adenoma   . Esophageal dysmotility     Past Surgical History  Procedure Laterality Date  . Tonsillectomy    . Hemorroidectomy    . Hernia repair    . Coccyx removal    . Colonoscopy  2005    Dr. Laural Golden: mild melanosis coli, otherwise normal  . Ercp  1997    [facility]: biliary manometry abnormal, subsequent sphincterotomy   . Foot surgery      X2  . Colonoscopy with propofol N/A 09/14/2013    [id number]:[id number] coli. Colonic diverticulosis. Single colonic. Tubular adenoma. Next TCS 09/2020.  [name] Esophagogastroduodenoscopy (egd) with propofol N/A 09/14/2013    CZY:[id number] ring. Hiatal hernia. Status post Venia Minks and biopsy disruption.   Venia Minks dilation N/A 09/14/2013    Procedure: Venia Minks DILATION;  Surgeon: [name], MD;   Location: [facility];  Service: Endoscopy;  Laterality: N/A;  56  . Polypectomy N/A 09/14/2013    Procedure: POLYPECTOMY;  Surgeon: [name], MD;  Location: [facility];  Service: Endoscopy;  Laterality: N/A;    Prior to Admission medications   Medication Sig Start Date End Date Taking? Authorizing Provider  acetaminophen (TYLENOL) 500 MG tablet Take 500 mg by mouth every 6 (six) hours as needed. For pain     Historical Provider, MD  albuterol (VENTOLIN HFA) 108 (90 BASE) MCG/ACT inhaler Inhale 2 puffs into the lungs every 6 (six) hours as needed. For shortness of breath     Historical Provider, MD  cyclobenzaprine (FLEXERIL) 10 MG tablet Take 10 mg by mouth at bedtime.    Historical Provider, MD  FLUoxetine (PROZAC) 20 MG tablet Take 20 mg by mouth daily.    Historical Provider, MD  furosemide (LASIX) 40 MG tablet Take 40 mg by mouth daily. 04/18/13   Historical Provider, MD  HYDROcodone-acetaminophen (NORCO) 10-325 MG per tablet Take 1 tablet by mouth every 4 (four) hours as needed. pain 06/15/13   Historical Provider, MD  lamoTRIgine (LAMICTAL) 100 MG tablet Take 150 mg by mouth 2 (two) times daily.     Historical Provider, MD  latanoprost (XALATAN) 0.005 % ophthalmic solution Place 1 drop into both eyes at bedtime. 04/20/13   Historical Provider, MD  levothyroxine (SYNTHROID, LEVOTHROID) 50 MCG tablet Take 50 mcg by mouth daily.  Historical Provider, MD  Linaclotide Rolan Lipa) 290 MCG CAPS capsule Take 1 capsule (290 mcg total) by mouth daily. 12/15/13   [name], PA-C  metoprolol succinate (TOPROL-XL) 50 MG 24 hr tablet Take 50 mg by mouth daily. 04/18/13   Historical Provider, MD  montelukast (SINGULAIR) 10 MG tablet Take 10 mg by mouth daily. 05/03/13   Historical Provider, MD  morphine (MS CONTIN) 30 MG 12 hr tablet Take 30 mg by mouth every 12 (twelve) hours. 06/15/13   Historical Provider, MD  Multiple Vitamins-Minerals (PRESERVISION/LUTEIN PO) Take 1 tablet by mouth daily.    Historical  Provider, MD  omeprazole (PRILOSEC) 20 MG capsule Take 20 mg by mouth daily. 04/18/13   Historical Provider, MD  polyethylene glycol (MIRALAX / GLYCOLAX) packet Take 17 g by mouth 2 (two) times daily. 07/31/13   [name], NP  QUEtiapine (SEROQUEL) 50 MG tablet Take 50 mg by mouth at bedtime. 04/18/13   Historical Provider, MD  Wheat Dextrin (BENEFIBER) POWD 2 teaspoons added to food/water three times daily. Start with once daily and increase to three times daily over the next 5 days. 12/15/13   [name], PA-C    Allergies as of 09/25/2014 - Review Complete 06/12/2014  Allergen Reaction Noted  . Sulfa antibiotics Rash 08/26/2010    Family History  Problem Relation Age of Onset  . Colon cancer Neg Hx   . Lung cancer Mother   . Heart attack Father   . Bone cancer Brother   . COPD Sister     Social History   Social History  . Marital Status: Divorced    Spouse Name: N/A  . Number of Children: N/A  . Years of Education: N/A   Occupational History  . Not on file.   Social History Main Topics  . Smoking status: Never Smoker   . Smokeless tobacco: Not on file     Comment: NEVER SMOKED  . Alcohol Use: No  . Drug Use: No  . Sexual Activity: Not on file   Other Topics Concern  . Not on file   Social History Narrative    Review of Systems: See HPI, otherwise negative ROS  Physical Exam: BP 125/80 mmHg  Pulse 109  Temp(Src) 98.5 F (36.9 C) (Oral)  Ht 5' (1.524 m)  Wt 138 lb 12.8 oz (62.959 kg)  BMI 27.11 kg/m2 General:   Alert,  Somewhat anxious appearing, pleasant and cooperative in NAD Skin:  Intact without significant lesions or rashes. Eyes:  Sclera clear, no icterus.   Conjunctiva pink. Ears:  Normal auditory acuity. Nose:  No deformity, discharge,  or lesions. Mouth:  No deformity or lesions. Neck:  Supple; no masses or thyromegaly. No significant cervical adenopathy. Lungs:  Clear throughout to auscultation.   No wheezes, crackles, or rhonchi. No acute  distress. Heart:  Regular rate and rhythm; no murmurs, clicks, rubs,  or gallops. Abdomen: Non-distended, normal bowel sounds.  Soft and nontender without appreciable mass or hepatosplenomegaly. No succussion splash. Pulses:  Normal pulses noted. Extremities:  Without clubbing or edema.  Impression:  Pleasant 69 year old lady with chronic intermittent nausea, GERD along with a large hiatal hernia. Polypharmacy including narcotic use likely contributing to slowing of GI motility diffusely. I suspect that she may need a more aggressive regimen than omeprazole 20 mg daily to combat GERD. Chronic constipation likely functional, in part, related to polypharmacy as well. By history, patient waiting too long to start laxative therapy during the periods  of constipation. Dysphagia resolved  - status post dilation of Schatzki's ring.  Recommendations:  Take 17 grams of Miralax in the evening on any given day without a BM  Our goal is a BM every other day or 3x weekly  Call me in 2 weeks and tell us how things are going  GERD and constipation information provided  Stop omeprazole (Prilosec); trial of Protonix 40 mg daily  Office visit with [place] in 2 months      Notice: This dictation was prepared with Dragon dictation along with smaller phrase technology. Any transcriptional errors that result from this process are unintentional and may not be corrected upon review.

## 2014-10-09 DIAGNOSIS — G894 Chronic pain syndrome: Secondary | ICD-10-CM | POA: Diagnosis not present

## 2014-10-09 DIAGNOSIS — M5134 Other intervertebral disc degeneration, thoracic region: Secondary | ICD-10-CM | POA: Diagnosis not present

## 2014-10-09 DIAGNOSIS — Z79891 Long term (current) use of opiate analgesic: Secondary | ICD-10-CM | POA: Diagnosis not present

## 2014-10-12 DIAGNOSIS — H338 Other retinal detachments: Secondary | ICD-10-CM | POA: Diagnosis not present

## 2014-10-12 DIAGNOSIS — H3532 Exudative age-related macular degeneration: Secondary | ICD-10-CM | POA: Diagnosis not present

## 2014-10-15 ENCOUNTER — Telehealth: Payer: Self-pay

## 2014-10-15 NOTE — Telephone Encounter (Signed)
Pt called and states that RMR put her Protonix at her last visit. Pt states she was to call the office to let us know that it is working and she needs a RX for it now. Please advise

## 2014-10-25 NOTE — Telephone Encounter (Signed)
Received a fax from [facility]. They are requesting rx be sent in for 90 day supply

## 2014-10-26 ENCOUNTER — Telehealth: Payer: Self-pay | Admitting: Internal Medicine

## 2014-10-26 MED ORDER — PANTOPRAZOLE SODIUM 40 MG PO TBEC: 40.0000 mg | DELAYED_RELEASE_TABLET | Freq: Every day | ORAL | Status: DC

## 2014-10-26 NOTE — Telephone Encounter (Signed)
Tried to call pt- NA, no voicemail 

## 2014-10-26 NOTE — Addendum Note (Signed)
Addended by: Gordy Levan, ERIC A on: 10/26/2014 12:16 PM   Modules accepted: Orders

## 2014-10-26 NOTE — Telephone Encounter (Signed)
[phone]  Patient script for protonix needs to go to [facility]   Please call her

## 2014-10-26 NOTE — Telephone Encounter (Signed)
This was sent to the refill box yesterday.

## 2014-10-26 NOTE — Telephone Encounter (Signed)
Notify patient, refill sent for 90 day supply to [facility] as requested.

## 2014-10-30 NOTE — Telephone Encounter (Signed)
Tried to call pt- NA 

## 2014-10-31 NOTE — Telephone Encounter (Signed)
Open in error

## 2014-11-02 ENCOUNTER — Other Ambulatory Visit: Payer: Self-pay

## 2014-11-07 MED ORDER — PANTOPRAZOLE SODIUM 40 MG PO TBEC: 40.0000 mg | DELAYED_RELEASE_TABLET | Freq: Every day | ORAL | Status: DC

## 2014-11-22 ENCOUNTER — Telehealth: Payer: Self-pay | Admitting: Internal Medicine

## 2014-11-22 NOTE — Telephone Encounter (Signed)
#  3 boxes of linzess 286mg at the front desk.

## 2014-11-22 NOTE — Telephone Encounter (Signed)
[phone]  PATIENT WANTS TO KNOW IF SHE CAN COME AND GET SAMPLES OF LINZESS WHEN SHE IS OUT TODAY IN ABOUT AN HOUR.

## 2014-11-27 ENCOUNTER — Encounter: Payer: Self-pay | Admitting: Gastroenterology

## 2014-11-27 ENCOUNTER — Ambulatory Visit (INDEPENDENT_AMBULATORY_CARE_PROVIDER_SITE_OTHER): Payer: Medicare Other | Admitting: Gastroenterology

## 2014-11-27 VITALS — BP 103/60 | HR 74 | Temp 97.7°F | Ht 60.0 in | Wt 148.4 lb

## 2014-11-27 DIAGNOSIS — K5901 Slow transit constipation: Secondary | ICD-10-CM | POA: Diagnosis not present

## 2014-11-27 DIAGNOSIS — D509 Iron deficiency anemia, unspecified: Secondary | ICD-10-CM | POA: Diagnosis not present

## 2014-11-27 MED ORDER — LINACLOTIDE 290 MCG PO CAPS
290.0000 ug | ORAL_CAPSULE | Freq: Every day | ORAL | Status: DC
Start: 2014-11-27 — End: 2015-09-06

## 2014-11-27 NOTE — Progress Notes (Signed)
Referring Provider: Briscoe Deutscher, MD Primary Care Physician:  Abigail Miyamoto, MD  Primary GI: Dr. Gala Romney   Chief Complaint  Patient presents with  . Follow-up    HPI:   Heidi Fuentes is a 69 y.o. female presenting today with a history of chronic constipation and GERD. Takes Linzess 290 mcg daily. Known large hiatal hernia. Protonix prescribed at last visit in Aug 2016.   Doing much better with Protonix. Not working outside as much. Came and got Linzess samples. Can't afford to take the Linzess every day as it would put her in the donut hole. Uses Miralax and stool softeners. Felt something oozing overnight. Has happened about 4 times. "goes around the hard stool". Has taken linzess the last few days without any improvement. Last BM about a week and a half ago.   Past Medical History  Diagnosis Date  . Fibromyalgia   . Depression   . Thyroid disease   . Back pain   . Asthma   . Chronic pain syndrome 07/10/2013  . Duodenal papillary stenosis   . Macular degeneration   . Glaucoma   . 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 Hypothyroidism   . GERD (gastroesophageal reflux disease)   . Hypertension   . Tubular adenoma   . Esophageal dysmotility     Past Surgical History  Procedure Laterality Date  . Tonsillectomy    . Hemorroidectomy    . Hernia repair    . Coccyx removal    . Colonoscopy  2005    Dr. Laural Golden: mild melanosis coli, otherwise normal  . Ercp  1997    [facility]: biliary manometry abnormal, subsequent sphincterotomy   . Foot surgery      X2  . Colonoscopy with propofol N/A 09/14/2013    HFW:YOVZCHYIF coli. Colonic diverticulosis. Single colonic. Tubular adenoma. Next TCS 09/2020.  [name] Esophagogastroduodenoscopy (egd) with propofol N/A 09/14/2013    OYD:XAJOINOM'V ring. Hiatal hernia. Status post Venia Minks and biopsy disruption.   Venia Minks dilation N/A 09/14/2013    Procedure: Venia Minks DILATION;  Surgeon: [name], MD;  Location: [facility];  Service: Endoscopy;   Laterality: N/A;  56  . Polypectomy N/A 09/14/2013    Procedure: POLYPECTOMY;  Surgeon: [name], MD;  Location: [facility];  Service: Endoscopy;  Laterality: N/A;    Current Outpatient Prescriptions  Medication Sig Dispense Refill  . acetaminophen (TYLENOL) 500 MG tablet Take 500 mg by mouth every 6 (six) hours as needed. For pain     . albuterol (VENTOLIN HFA) 108 (90 BASE) MCG/ACT inhaler Inhale 2 puffs into the lungs every 6 (six) hours as needed. For shortness of breath     . cyclobenzaprine (FLEXERIL) 10 MG tablet Take 10 mg by mouth at bedtime.    [name] FLUoxetine (PROZAC) 20 MG tablet Take 20 mg by mouth daily.    . furosemide (LASIX) 40 MG tablet Take 40 mg by mouth daily.    [name] HYDROcodone-acetaminophen (NORCO) 10-325 MG per tablet Take 1 tablet by mouth every 4 (four) hours as needed. pain    . lamoTRIgine (LAMICTAL) 100 MG tablet Take 150 mg by mouth 2 (two) times daily.     [name] latanoprost (XALATAN) 0.005 % ophthalmic solution Place 1 drop into both eyes at bedtime.    [name] levothyroxine (SYNTHROID, LEVOTHROID) 50 MCG tablet Take 50 mcg by mouth daily.      . Linaclotide (LINZESS) 290 MCG CAPS capsule Take 1 capsule (290 mcg total)  by mouth daily. 20 capsule 0  . metoprolol succinate (TOPROL-XL) 50 MG 24 hr tablet Take 50 mg by mouth daily.    . montelukast (SINGULAIR) 10 MG tablet Take 10 mg by mouth daily.    . Multiple Vitamins-Minerals (PRESERVISION/LUTEIN PO) Take 1 tablet by mouth daily.    . pantoprazole (PROTONIX) 40 MG tablet Take 1 tablet (40 mg total) by mouth daily. 90 tablet 3  . polyethylene glycol (MIRALAX / GLYCOLAX) packet Take 17 g by mouth 2 (two) times daily. 60 each 5  . QUEtiapine (SEROQUEL) 50 MG tablet Take 50 mg by mouth at bedtime.    [name] morphine (MS CONTIN) 30 MG 12 hr tablet Take 30 mg by mouth every 12 (twelve) hours.    . Wheat Dextrin (BENEFIBER) POWD 2 teaspoons added to food/water three times daily. Start with once daily and increase to three times daily  over the next 5 days. (Patient not taking: Reported on 11/27/2014)  0   No current facility-administered medications for this visit.    Allergies as of 11/27/2014 - Review Complete 11/27/2014  Allergen Reaction Noted  . Sulfa antibiotics Rash 08/26/2010    Family History  Problem Relation Age of Onset  . Colon cancer Neg Hx   . Lung cancer Mother   . Heart attack Father   . Bone cancer Brother   . COPD Sister     Social History   Social History  . Marital Status: Divorced    Spouse Name: N/A  . Number of Children: N/A  . Years of Education: N/A   Social History Main Topics  . Smoking status: Never Smoker   . Smokeless tobacco: None     Comment: NEVER SMOKED  . Alcohol Use: No  . Drug Use: No  . Sexual Activity: Not Asked   Other Topics Concern  . None   Social History Narrative    Review of Systems: As mentioned in HPI   Physical Exam: BP 103/60 mmHg  Pulse 74  Temp(Src) 97.7 F (36.5 C) (Oral)  Ht 5' (1.524 m)  Wt 148 lb 6.4 oz (67.314 kg)  BMI 28.98 kg/m2 General:   Alert and oriented. No distress noted. Pleasant and cooperative.  Head:  Normocephalic and atraumatic. Eyes:  Conjuctiva clear without scleral icterus. Abdomen:  +BS, soft, non-tender and non-distended. No peritoneal signs Msk:  Symmetrical without gross deformities. Normal posture. Extremities:  Without edema. Neurologic:  Alert and  oriented x4;  grossly normal neurologically. Psych:  Alert and cooperative. Normal mood and affect.   Nov 2015:  Iron 84, ferritin 19, Hgb 13.8

## 2014-11-27 NOTE — Patient Instructions (Signed)
Today, follow the directions on Suprep (the bowel prep), for the split dose regimen. If needed, you may repeat tomorrow.   After you have gotten relief, start taking Linzess every other day and Miralax daily to twice a day.   Please have blood work done and call if you have any issues with the bowel prep.

## 2014-11-28 LAB — IRON AND TIBC
%SAT: 13 % (ref 11–50)
Iron: 50 ug/dL (ref 45–160)
TIBC: 392 ug/dL (ref 250–450)
UIBC: 342 ug/dL (ref 125–400)

## 2014-11-28 LAB — CBC
HCT: 37.3 % (ref 36.0–46.0)
HEMOGLOBIN: 12.2 g/dL (ref 12.0–15.0)
MCH: 29.7 pg (ref 26.0–34.0)
MCHC: 32.7 g/dL (ref 30.0–36.0)
MCV: 90.8 fL (ref 78.0–100.0)
MPV: 10.9 fL (ref 8.6–12.4)
Platelets: 332 10*3/uL (ref 150–400)
RBC: 4.11 MIL/uL (ref 3.87–5.11)
RDW: 14.3 % (ref 11.5–15.5)
WBC: 9.8 10*3/uL (ref 4.0–10.5)

## 2014-11-28 LAB — FERRITIN: Ferritin: 16 ng/mL (ref 10–291)

## 2014-11-28 NOTE — Progress Notes (Signed)
Quick Note:  Ferritin low normal, iron low normal, Hgb normal/stable.  Would recheck in 3 months.  Please have her return in 3 months. ______

## 2014-11-28 NOTE — Assessment & Plan Note (Signed)
Multifactorial, with low normal iron and ferritin in past. Last checked a year ago. No overt GI bleeding. Check CBC, iron studies now. TCS/EGD up-to-date. May consider hemoccult +/-capsule study if IDA.

## 2014-11-28 NOTE — Assessment & Plan Note (Signed)
Chronic constipation, still with need for more aggressive regimen. However, she is unable to afford Linzess daily but has noted good results taking every other day. Will continue Linzess 290 mcg every other day, Miralax daily to BID. HOWEVER, first needs immediate relief of constipation. Will do split prep bowel prep this afternoon (Suprep) and repeat X 1 tomorrow if needed. As of note, patient has been hospitalized for constipation/obstipation in the past, so we will treat this more aggressively on an outpatient basis to avoid a repeat scenario. Return in 3 months.

## 2014-11-29 NOTE — Progress Notes (Signed)
CC'ED TO PCP 

## 2014-11-30 DIAGNOSIS — H353231 Exudative age-related macular degeneration, bilateral, with active choroidal neovascularization: Secondary | ICD-10-CM | POA: Diagnosis not present

## 2014-12-04 ENCOUNTER — Telehealth: Payer: Self-pay | Admitting: Internal Medicine

## 2014-12-04 NOTE — Telephone Encounter (Signed)
Patient called inquiring about her lab results from last week  Please advise. [phone]

## 2014-12-05 ENCOUNTER — Encounter: Payer: Self-pay | Admitting: Internal Medicine

## 2014-12-05 ENCOUNTER — Other Ambulatory Visit: Payer: Self-pay | Admitting: Gastroenterology

## 2014-12-05 DIAGNOSIS — D509 Iron deficiency anemia, unspecified: Secondary | ICD-10-CM

## 2014-12-05 NOTE — Telephone Encounter (Signed)
Spoke with the pt and gave her the results.

## 2014-12-08 DIAGNOSIS — M545 Low back pain: Secondary | ICD-10-CM | POA: Diagnosis not present

## 2014-12-11 ENCOUNTER — Emergency Department (HOSPITAL_COMMUNITY): Payer: Medicare Other

## 2014-12-11 ENCOUNTER — Emergency Department (HOSPITAL_COMMUNITY)
Admission: EM | Admit: 2014-12-11 | Discharge: 2014-12-11 | Disposition: A | Discharge status: Home / Self Care | Payer: Medicare Other | Attending: Emergency Medicine | Admitting: Emergency Medicine

## 2014-12-11 ENCOUNTER — Encounter (HOSPITAL_COMMUNITY): Payer: Self-pay | Admitting: Emergency Medicine

## 2014-12-11 DIAGNOSIS — M549 Dorsalgia, unspecified: Secondary | ICD-10-CM | POA: Diagnosis not present

## 2014-12-11 DIAGNOSIS — Z23 Encounter for immunization: Secondary | ICD-10-CM | POA: Insufficient documentation

## 2014-12-11 DIAGNOSIS — Y9289 Other specified places as the place of occurrence of the external cause: Secondary | ICD-10-CM | POA: Insufficient documentation

## 2014-12-11 DIAGNOSIS — J45909 Unspecified asthma, uncomplicated: Secondary | ICD-10-CM | POA: Diagnosis not present

## 2014-12-11 DIAGNOSIS — G894 Chronic pain syndrome: Secondary | ICD-10-CM | POA: Diagnosis not present

## 2014-12-11 DIAGNOSIS — Y9389 Activity, other specified: Secondary | ICD-10-CM | POA: Diagnosis not present

## 2014-12-11 DIAGNOSIS — Z8719 Personal history of other diseases of the digestive system: Secondary | ICD-10-CM | POA: Diagnosis not present

## 2014-12-11 DIAGNOSIS — S3992XA Unspecified injury of lower back, initial encounter: Secondary | ICD-10-CM | POA: Insufficient documentation

## 2014-12-11 DIAGNOSIS — I10 Essential (primary) hypertension: Secondary | ICD-10-CM | POA: Insufficient documentation

## 2014-12-11 DIAGNOSIS — S0990XA Unspecified injury of head, initial encounter: Secondary | ICD-10-CM | POA: Insufficient documentation

## 2014-12-11 DIAGNOSIS — N3001 Acute cystitis with hematuria: Secondary | ICD-10-CM

## 2014-12-11 DIAGNOSIS — Z7951 Long term (current) use of inhaled steroids: Secondary | ICD-10-CM | POA: Diagnosis not present

## 2014-12-11 DIAGNOSIS — R42 Dizziness and giddiness: Secondary | ICD-10-CM | POA: Diagnosis not present

## 2014-12-11 DIAGNOSIS — F329 Major depressive disorder, single episode, unspecified: Secondary | ICD-10-CM | POA: Diagnosis not present

## 2014-12-11 DIAGNOSIS — W01198A Fall on same level from slipping, tripping and stumbling with subsequent striking against other object, initial encounter: Secondary | ICD-10-CM | POA: Insufficient documentation

## 2014-12-11 DIAGNOSIS — W1830XA Fall on same level, unspecified, initial encounter: Secondary | ICD-10-CM | POA: Diagnosis not present

## 2014-12-11 DIAGNOSIS — Z86018 Personal history of other benign neoplasm: Secondary | ICD-10-CM | POA: Insufficient documentation

## 2014-12-11 DIAGNOSIS — Z79899 Other long term (current) drug therapy: Secondary | ICD-10-CM | POA: Diagnosis not present

## 2014-12-11 DIAGNOSIS — Y998 Other external cause status: Secondary | ICD-10-CM | POA: Diagnosis not present

## 2014-12-11 DIAGNOSIS — E079 Disorder of thyroid, unspecified: Secondary | ICD-10-CM | POA: Diagnosis not present

## 2014-12-11 DIAGNOSIS — E039 Hypothyroidism, unspecified: Secondary | ICD-10-CM | POA: Diagnosis not present

## 2014-12-11 DIAGNOSIS — H409 Unspecified glaucoma: Secondary | ICD-10-CM | POA: Insufficient documentation

## 2014-12-11 DIAGNOSIS — M797 Fibromyalgia: Secondary | ICD-10-CM | POA: Insufficient documentation

## 2014-12-11 DIAGNOSIS — S0180XA Unspecified open wound of other part of head, initial encounter: Secondary | ICD-10-CM | POA: Diagnosis not present

## 2014-12-11 DIAGNOSIS — W19XXXA Unspecified fall, initial encounter: Secondary | ICD-10-CM

## 2014-12-11 LAB — BASIC METABOLIC PANEL
Anion gap: 10 (ref 5–15)
BUN: 21 mg/dL — AB (ref 6–20)
CALCIUM: 9.4 mg/dL (ref 8.9–10.3)
CO2: 28 mmol/L (ref 22–32)
CREATININE: 0.86 mg/dL (ref 0.44–1.00)
Chloride: 99 mmol/L — ABNORMAL LOW (ref 101–111)
GFR calc Af Amer: >60 mL/min (ref >60)
GLUCOSE: 113 mg/dL — AB (ref 65–99)
POTASSIUM: 3.9 mmol/L (ref 3.5–5.1)
SODIUM: 137 mmol/L (ref 135–145)

## 2014-12-11 LAB — CBC WITH DIFFERENTIAL/PLATELET
Basophils Absolute: 0 10*3/uL (ref 0.0–0.1)
Basophils Relative: 0 %
EOS ABS: 0 10*3/uL (ref 0.0–0.7)
EOS PCT: 0 %
HCT: 38.8 % (ref 36.0–46.0)
Hemoglobin: 12.9 g/dL (ref 12.0–15.0)
LYMPHS ABS: 2 10*3/uL (ref 0.7–4.0)
Lymphocytes Relative: 18 %
MCH: 30.6 pg (ref 26.0–34.0)
MCHC: 33.2 g/dL (ref 30.0–36.0)
MCV: 92.2 fL (ref 78.0–100.0)
MONO ABS: 0.6 10*3/uL (ref 0.1–1.0)
Monocytes Relative: 5 %
Neutro Abs: 8.6 10*3/uL — ABNORMAL HIGH (ref 1.7–7.7)
Neutrophils Relative %: 77 %
PLATELETS: 340 10*3/uL (ref 150–400)
RBC: 4.21 MIL/uL (ref 3.87–5.11)
RDW: 14.2 % (ref 11.5–15.5)
WBC: 11.1 10*3/uL — AB (ref 4.0–10.5)

## 2014-12-11 LAB — URINALYSIS, ROUTINE W REFLEX MICROSCOPIC
BILIRUBIN URINE: NEGATIVE
GLUCOSE, UA: NEGATIVE mg/dL
Ketones, ur: NEGATIVE mg/dL
Nitrite: NEGATIVE
PROTEIN: NEGATIVE mg/dL
UROBILINOGEN UA: 0.2 mg/dL (ref 0.0–1.0)
pH: 6.5 (ref 5.0–8.0)

## 2014-12-11 LAB — URINE MICROSCOPIC-ADD ON

## 2014-12-11 MED ORDER — CEPHALEXIN 500 MG PO CAPS: 500.0000 mg | ORAL_CAPSULE | Freq: Four times a day (QID) | ORAL | Status: DC

## 2014-12-11 MED ORDER — HYDROCODONE-ACETAMINOPHEN 5-325 MG PO TABS
1.0000 | ORAL_TABLET | Freq: Once | ORAL | Status: AC
  Administered 2014-12-11: 1 via ORAL
  Filled 2014-12-11: qty 1

## 2014-12-11 MED ORDER — CEPHALEXIN 500 MG PO CAPS
500.0000 mg | ORAL_CAPSULE | Freq: Once | ORAL | Status: AC
  Administered 2014-12-11: 500 mg via ORAL
  Filled 2014-12-11: qty 1

## 2014-12-11 MED ORDER — MECLIZINE HCL 50 MG PO TABS: 50.0000 mg | ORAL_TABLET | Freq: Three times a day (TID) | ORAL | Status: DC | PRN

## 2014-12-11 MED ORDER — TETANUS-DIPHTH-ACELL PERTUSSIS 5-2.5-18.5 LF-MCG/0.5 IM SUSP
0.5000 mL | Freq: Once | INTRAMUSCULAR | Status: AC
  Administered 2014-12-11: 0.5 mL via INTRAMUSCULAR
  Filled 2014-12-11: qty 0.5

## 2014-12-11 NOTE — ED Provider Notes (Signed)
CSN: [id number]     Arrival date & time 12/11/14  1340 History   First MD Initiated Contact with Patient 12/11/14 1353     Chief Complaint  Patient presents with  . Fall     (Consider location/radiation/quality/duration/timing/severity/associated sxs/prior Treatment) HPI 69 year old female who presents with dizziness and fall. History of chronic pain syndrome, fibromyalgia, thyroid disease, and macular degeneration. States that she has had on and off dizziness in the past that has not been previously evaluated given the fleeting nature of symptoms. Reported that she had a mechanical fall week ago in the setting of feeling dizzy, but did not have any serious injury. States that today, while at jury duty, she had sudden onset of dizziness again while getting up from the [place] stand. Describes the sensation as the room moving around her and associated with gait instability. She was briefly nauseous but had no vomiting. Felt that her vision seemed distorted, but denies double vision, speech changes, difficulty swallowing, numbness or weakness. She subsequently lost her balance and fell hitting her head. She didn't have any loss of consciousness, but did say that she became incontinent of her urine afterwards. Says that the dizziness fully resolved. Complains of headache in the back of her head and mild mid back pain. No recent upper respiratory illnesses, fevers, extremity weakness or numbness, abdominal pain, flank pain, urinary retention, dysuria or urinary frequency. Denies near syncope or syncope.   Past Medical History  Diagnosis Date  . Fibromyalgia   . Depression   . Thyroid disease   . Back pain   . Asthma   . Chronic pain syndrome 07/10/2013  . Duodenal papillary stenosis   . Macular degeneration   . Glaucoma   . 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 Hypothyroidism   . GERD (gastroesophageal reflux disease)   . Hypertension   . Tubular adenoma   . Esophageal dysmotility    Past  Surgical History  Procedure Laterality Date  . Tonsillectomy    . Hemorroidectomy    . Hernia repair    . Coccyx removal    . Colonoscopy  2005    Dr. Laural Golden: mild melanosis coli, otherwise normal  . Ercp  1997    [facility]: biliary manometry abnormal, subsequent sphincterotomy   . Foot surgery      X2  . Colonoscopy with propofol N/A 09/14/2013    PJA:[id number] coli. Colonic diverticulosis. Single colonic. Tubular adenoma. Next TCS 09/2020.  [name] Esophagogastroduodenoscopy (egd) with propofol N/A 09/14/2013    HAL:[id number] ring. Hiatal hernia. Status post Venia Minks and biopsy disruption.   Venia Minks dilation N/A 09/14/2013    Procedure: Venia Minks DILATION;  Surgeon: [name], MD;  Location: [facility];  Service: Endoscopy;  Laterality: N/A;  56  . Polypectomy N/A 09/14/2013    Procedure: POLYPECTOMY;  Surgeon: [name], MD;  Location: [facility];  Service: Endoscopy;  Laterality: N/A;   Family History  Problem Relation Age of Onset  . Colon cancer Neg Hx   . Lung cancer Mother   . Heart attack Father   . Bone cancer Brother   . COPD Sister    Social History  Substance Use Topics  . Smoking status: Never Smoker   . Smokeless tobacco: None     Comment: NEVER SMOKED  . Alcohol Use: No   OB History    Gravida Para Term Preterm AB TAB SAB Ectopic Multiple Living   2 2 2  Review of Systems 10/14 systems reviewed and are negative other than those stated in the HPI  Allergies  Sulfa antibiotics  Home Medications   Prior to Admission medications   Medication Sig Start Date End Date Taking? Authorizing Provider  acetaminophen (TYLENOL) 500 MG tablet Take 500 mg by mouth every 6 (six) hours as needed. For pain    Yes Historical Provider, MD  albuterol (VENTOLIN HFA) 108 (90 BASE) MCG/ACT inhaler Inhale 2 puffs into the lungs every 6 (six) hours as needed. For shortness of breath    Yes Historical Provider, MD  baclofen (LIORESAL) 10 MG tablet Take 10 mg by mouth 3 (three)  times daily. 7 day course as needed started on 12/08/2014 12/08/14  Yes Historical Provider, MD  cyclobenzaprine (FLEXERIL) 10 MG tablet Take 10 mg by mouth at bedtime.   Yes Historical Provider, MD  FLUoxetine (PROZAC) 20 MG tablet Take 20 mg by mouth daily.   Yes Historical Provider, MD  fluticasone (FLONASE) 50 MCG/ACT nasal spray Place 2 sprays into both nostrils daily.   Yes Historical Provider, MD  furosemide (LASIX) 40 MG tablet Take 40 mg by mouth daily. 04/18/13  Yes Historical Provider, MD  HYDROcodone-acetaminophen (NORCO) 10-325 MG per tablet Take 1 tablet by mouth every 4 (four) hours as needed. pain 06/15/13  Yes Historical Provider, MD  lamoTRIgine (LAMICTAL) 150 MG tablet Take 150 mg by mouth 2 (two) times daily.   Yes Historical Provider, MD  latanoprost (XALATAN) 0.005 % ophthalmic solution Place 1 drop into both eyes at bedtime. 04/20/13  Yes Historical Provider, MD  levothyroxine (SYNTHROID, LEVOTHROID) 50 MCG tablet Take 50 mcg by mouth daily.     Yes Historical Provider, MD  Linaclotide (LINZESS) 290 MCG CAPS capsule Take 1 capsule (290 mcg total) by mouth daily. Patient taking differently: Take 290 mcg by mouth daily as needed (for constipation).  11/27/14  Yes [name], NP  metoprolol succinate (TOPROL-XL) 50 MG 24 hr tablet Take 50 mg by mouth daily. 04/18/13  Yes Historical Provider, MD  montelukast (SINGULAIR) 10 MG tablet Take 10 mg by mouth daily. 05/03/13  Yes Historical Provider, MD  Multiple Vitamins-Minerals (PRESERVISION/LUTEIN PO) Take 1 tablet by mouth daily.   Yes Historical Provider, MD  pantoprazole (PROTONIX) 40 MG tablet Take 1 tablet (40 mg total) by mouth daily. 11/07/14  Yes Carlis Stable, NP  polyethylene glycol (MIRALAX / GLYCOLAX) packet Take 17 g by mouth 2 (two) times daily. Patient taking differently: Take 17 g by mouth daily as needed for mild constipation or moderate constipation.  07/31/13  Yes [name], NP  predniSONE (DELTASONE) 10 MG tablet Take 20  mg by mouth 2 (two) times daily. 5 day course starting on 12/08/2014 12/08/14  Yes Historical Provider, MD  QUEtiapine (SEROQUEL) 50 MG tablet Take 50 mg by mouth at bedtime. 04/18/13  Yes Historical Provider, MD  cephALEXin (KEFLEX) 500 MG capsule Take 1 capsule (500 mg total) by mouth 4 (four) times daily. 12/11/14   Forde Dandy, MD  meclizine (ANTIVERT) 50 MG tablet Take 1 tablet (50 mg total) by mouth 3 (three) times daily as needed for dizziness. 12/11/14   Forde Dandy, MD  morphine (MS CONTIN) 30 MG 12 hr tablet Take 30 mg by mouth every 12 (twelve) hours. 06/15/13   Historical Provider, MD  Wheat Dextrin (BENEFIBER) POWD 2 teaspoons added to food/water three times daily. Start with once daily and increase to three times daily over the next 5  days. Patient not taking: Reported on 11/27/2014 12/15/13   [name], PA-C   BP 137/96 mmHg  Pulse 71  Temp(Src) 98.4 F (36.9 C) (Oral)  Resp 16  Ht 5' (1.524 m)  Wt 140 lb (63.504 kg)  BMI 27.34 kg/m2  SpO2 93% Physical Exam Physical Exam  Nursing note and vitals reviewed. Constitutional: Well developed, well nourished, non-toxic, and in no acute distress Head: Normocephalic. Posterior scalp hematoma with abrasion. No active bleeding.  Mouth/Throat: Oropharynx is clear and moist.  Neck: Normal range of motion. Neck supple. No cervical spine tenderness. Cardiovascular: Normal rate and regular rhythm.   Pulmonary/Chest: Effort normal and breath sounds normal. No chest wall tenderness. Abdominal: Soft. There is no tenderness. There is no rebound and no guarding. No CVA tenderness. Musculoskeletal: Normal range of motion. No deformities.  Skin: Skin is warm and dry.  Psychiatric: Cooperative Neurological:  Alert, oriented to person, place, time, and situation. Memory grossly in tact. Fluent speech. No dysarthria or aphasia.  Cranial nerves: VF are full. Pupils are symmetric, and reactive to light. EOMI without nystagmus. No gaze deviation.  Facial muscles symmetric with activation. Sensation to light touch over face in tact bilaterally. Hearing grossly in tact. Palate elevates symmetrically. Head turn and shoulder shrug are intact. Tongue midline.  Reflexes defered.  Muscle bulk and tone normal. No pronator drift. Moves all extremities symmetrically. Sensation to light touch is in tact throughout in bilateral upper and lower extremities. Coordination reveals no dysmetria with finger to nose. Gait is narrow-based and steady. Non-ataxic.  ED Course  Procedures (including critical care time) Labs Review Labs Reviewed  URINALYSIS, ROUTINE W REFLEX MICROSCOPIC (NOT AT [facility]) - Abnormal; Notable for the following:    Color, Urine ORANGE (*)    Specific Gravity, Urine <1.005 (*)    Hgb urine dipstick TRACE (*)    Leukocytes, UA SMALL (*)    All other components within normal limits  CBC WITH DIFFERENTIAL/PLATELET - Abnormal; Notable for the following:    WBC 11.1 (*)    Neutro Abs 8.6 (*)    All other components within normal limits  BASIC METABOLIC PANEL - Abnormal; Notable for the following:    Chloride 99 (*)    Glucose, Bld 113 (*)    BUN 21 (*)    All other components within normal limits  URINE MICROSCOPIC-ADD ON - Abnormal; Notable for the following:    Squamous Epithelial / LPF MANY (*)    All other components within normal limits  URINE CULTURE    Imaging Review Dg Thoracic Spine W/swimmers  12/11/2014  CLINICAL DATA:  69 year old female with acute onset vertigo and fall with back pain. Initial encounter. EXAM: THORACIC SPINE - 3 VIEWS COMPARISON:  Thoracic spine MRI 11/14/2012. FINDINGS: Normal thoracic segmentation. Thoracic vertebral height and alignment appears stable. There is mild wedging of T5 and T11 as before. Osteopenia. Cervicothoracic junction alignment is within normal limits. Grossly intact visualized lumbar levels. Posterior ribs appear intact. Grossly stable thoracic visceral contours. IMPRESSION: No  acute fracture or listhesis identified in the thoracic spine. Electronically Signed   By: Genevie Ann M.D.   On: 12/11/2014 15:10   Dg Lumbar Spine Complete  12/11/2014  CLINICAL DATA:  69 year old female with acute onset vertigo and fall with back pain. Initial encounter. EXAM: LUMBAR SPINE - COMPLETE 4+ VIEW COMPARISON:  Thoracic spine series from today reported separately. CT Abdomen and Pelvis 07/09/2013. FINDINGS: Normal lumbar segmentation. Stable grade 1 anterolisthesis at L4-L5. Lumbar  vertebral height and alignment appears stable. Disc spaces are relatively preserved. No pars fracture. SI joints within normal limits. IMPRESSION: No acute fracture or listhesis identified in the lumbar spine. Electronically Signed   By: Genevie Ann M.D.   On: 12/11/2014 15:12   Ct Head Wo Contrast  12/11/2014  CLINICAL DATA:  Dizziness and fall.  Hit head during fall. EXAM: CT HEAD WITHOUT CONTRAST CT CERVICAL SPINE WITHOUT CONTRAST TECHNIQUE: Multidetector CT imaging of the head and cervical spine was performed following the standard protocol without intravenous contrast. Multiplanar CT image reconstructions of the cervical spine were also generated. COMPARISON:  05/29/2012 FINDINGS: CT HEAD FINDINGS Prominent CSF along the frontal lobes is unchanged from the previous examination. No evidence for acute hemorrhage, mass lesion, midline shift, hydrocephalus or large infarct. Subtle low-density in the white matter is similar to the previous examination. Again noted is frothy mucosal disease in the left sphenoid sinus. No evidence for a calvarial fracture. CT CERVICAL SPINE FINDINGS Mild scarring at the lung apices. Negative for pneumothorax. No significant soft tissue swelling in the neck. No significant lymphadenopathy in the neck. There is disc space narrowing and endplate disease at A6-T0. There is no evidence for an acute fracture or dislocation. Alignment of the cervical spine is within normal limits. Degenerative facet  arthropathy particularly on the left side. IMPRESSION: No acute intracranial abnormality. Subtle white matter disease could represent chronic small vessel ischemic changes. No acute bone abnormality to the cervical spine. Degenerative changes in cervical spine, most prominent at C5-C6. Electronically Signed   By: Markus Daft M.D.   On: 12/11/2014 15:18   Ct Cervical Spine Wo Contrast  12/11/2014  CLINICAL DATA:  Dizziness and fall.  Hit head during fall. EXAM: CT HEAD WITHOUT CONTRAST CT CERVICAL SPINE WITHOUT CONTRAST TECHNIQUE: Multidetector CT imaging of the head and cervical spine was performed following the standard protocol without intravenous contrast. Multiplanar CT image reconstructions of the cervical spine were also generated. COMPARISON:  05/29/2012 FINDINGS: CT HEAD FINDINGS Prominent CSF along the frontal lobes is unchanged from the previous examination. No evidence for acute hemorrhage, mass lesion, midline shift, hydrocephalus or large infarct. Subtle low-density in the white matter is similar to the previous examination. Again noted is frothy mucosal disease in the left sphenoid sinus. No evidence for a calvarial fracture. CT CERVICAL SPINE FINDINGS Mild scarring at the lung apices. Negative for pneumothorax. No significant soft tissue swelling in the neck. No significant lymphadenopathy in the neck. There is disc space narrowing and endplate disease at Z6-W1. There is no evidence for an acute fracture or dislocation. Alignment of the cervical spine is within normal limits. Degenerative facet arthropathy particularly on the left side. IMPRESSION: No acute intracranial abnormality. Subtle white matter disease could represent chronic small vessel ischemic changes. No acute bone abnormality to the cervical spine. Degenerative changes in cervical spine, most prominent at C5-C6. Electronically Signed   By: Markus Daft M.D.   On: 12/11/2014 15:18     I have personally reviewed and evaluated these  images and lab results as part of my medical decision-making.   MDM   Final diagnoses:  Fall  Vertigo  Acute cystitis with hematuria    69 year old female who presents with fall in the setting of vertigo. On presentation is well-appearing and in no acute distress. States that her vertigo is now fully resolved. Vital signs are non-concerning. She is neurologically intact. Presentation seems consistent with likely peripheral etiology of vertigo. No central signs are  concerning features on history or exam. She does have small scalp hematoma with abrasion, with no active bleeding. No major laceration for repair. Given her age with fall, CT head and cervical spine are performed. No evidence of acute intracranial or cervical spine trauma. X-ray of her lumbar/thoracic spine are also performed and negative for acute traumatic injuries. No major left July to metabolic derangements are noted. She is able to ambulate steadily in the emergency department. UA also suggestive of a urinary tract infection with numerous WBCs, and is sent for culture. This is the likely etiology of her urinary incontinence today. Eyes given a course of Keflex. No evidence of urinary retention, and back pain appears mild in nature. She is also neurologically intact and able to ambulate. I do not suspect spine injury. Felt appropriate for discharge home. Strict return and follow-up instructions are reviewed. She expressed understanding of all discharge instructions and felt comfortable with the plan of care.    Forde Dandy, MD 12/11/14 1745

## 2014-12-11 NOTE — ED Notes (Signed)
Charge RN, JJ received call for lab stating microscopic on UA is incorrect and will be changed.

## 2014-12-11 NOTE — Discharge Instructions (Signed)
Please take medications as prescribed. Return without fail for worsening symptoms including severe abdominal pain, fevers, vomiting unable to keep down food or fluids, confusion, or any other symptoms concerning to you.  Vertigo Vertigo means that you feel like you are moving when you are not. Vertigo can also make you feel like things around you are moving when they are not. This feeling can come and go at any time. Vertigo often goes away on its own. HOME CARE  Avoid making fast movements.  Avoid driving.  Avoid using heavy machinery.  Avoid doing any task or activity that might cause danger to you or other people if you would have a vertigo attack while you are doing it.  Sit down right away if you feel dizzy or have trouble with your balance.  Take over-the-counter and prescription medicines only as told by your doctor.  Follow instructions from your doctor about which positions or movements you should avoid.  Drink enough fluid to keep your pee (urine) clear or pale yellow.  Keep all follow-up visits as told by your doctor. This is important. GET HELP IF:  Medicine does not help your vertigo.  You have a fever.  Your problems get worse or you have new symptoms.  Your family or friends see changes in your behavior.  You feel sick to your stomach (nauseous) or you throw up (vomit).  You have a "pins and needles" feeling or you are numb in part of your body. GET HELP RIGHT AWAY IF:  You have trouble moving or talking.  You are always dizzy.  You pass out (faint).  You get very bad headaches.  You feel weak or have trouble using your hands, arms, or legs.  You have changes in your hearing.  You have changes in your seeing (vision).  You get a stiff neck.  Bright light starts to bother you.   This information is not intended to replace advice given to you by your health care provider. Make sure you discuss any questions you have with your health care  provider.   Document Released: 10/29/2007 Document Revised: 10/10/2014 Document Reviewed: 05/14/2014 Elsevier Interactive Patient Education 2016 Else[employer]  Urinary Tract Infection Urinary tract infections (UTIs) can develop anywhere along your urinary tract. Your urinary tract is your body's drainage system for removing wastes and extra water. Your urinary tract includes two kidneys, two ureters, a bladder, and a urethra. Your kidneys are a pair of bean-shaped organs. Each kidney is about the size of your fist. They are located below your ribs, one on each side of your spine. CAUSES Infections are caused by microbes, which are microscopic organisms, including fungi, viruses, and bacteria. These organisms are so small that they can only be seen through a microscope. Bacteria are the microbes that most commonly cause UTIs. SYMPTOMS  Symptoms of UTIs may vary by age and gender of the patient and by the location of the infection. Symptoms in young women typically include a frequent and intense urge to urinate and a painful, burning feeling in the bladder or urethra during urination. Older women and men are more likely to be tired, shaky, and weak and have muscle aches and abdominal pain. A fever may mean the infection is in your kidneys. Other symptoms of a kidney infection include pain in your back or sides below the ribs, nausea, and vomiting. DIAGNOSIS To diagnose a UTI, your caregiver will ask you about your symptoms. Your caregiver will also ask you to provide a  urine sample. The urine sample will be tested for bacteria and white blood cells. White blood cells are made by your body to help fight infection. TREATMENT  Typically, UTIs can be treated with medication. Because most UTIs are caused by a bacterial infection, they usually can be treated with the use of antibiotics. The choice of antibiotic and length of treatment depend on your symptoms and the type of bacteria causing your  infection. HOME CARE INSTRUCTIONS  If you were prescribed antibiotics, take them exactly as your caregiver instructs you. Finish the medication even if you feel better after you have only taken some of the medication.  Drink enough water and fluids to keep your urine clear or pale yellow.  Avoid caffeine, tea, and carbonated beverages. They tend to irritate your bladder.  Empty your bladder often. Avoid holding urine for long periods of time.  Empty your bladder before and after sexual intercourse.  After a bowel movement, women should cleanse from front to back. Use each tissue only once. SEEK MEDICAL CARE IF:   You have back pain.  You develop a fever.  Your symptoms do not begin to resolve within 3 days. SEEK IMMEDIATE MEDICAL CARE IF:   You have severe back pain or lower abdominal pain.  You develop chills.  You have nausea or vomiting.  You have continued burning or discomfort with urination. MAKE SURE YOU:   Understand these instructions.  Will watch your condition.  Will get help right away if you are not doing well or get worse.   This information is not intended to replace advice given to you by your health care provider. Make sure you discuss any questions you have with your health care provider.   Document Released: 10/29/2004 Document Revised: 10/10/2014 Document Reviewed: 02/27/2011 Elsevier Interactive Patient Education [employer].

## 2014-12-11 NOTE — ED Notes (Signed)
MD Liu at bedside updating patient and family.

## 2014-12-11 NOTE — ED Notes (Signed)
Pt ambulated to bathroom with NT. Pt gait steady and even. Pt denies dizziness or lightheadedness.

## 2014-12-11 NOTE — ED Notes (Signed)
Pt was at jury duty this am, got up to leave. Pt became dizzy and fell striking her head. Neck brace in place, bleeding controlled at this time. Per EMS pt has been having dizzy spells and falls x 2 weeks. Has been seen by Orthopaedic physician. Pt states this am she suddenly got dizzy, the room was spinning. Pt lost control of her bladder. Pt taking ASA, denies other anticoagulants.

## 2014-12-13 LAB — URINE CULTURE

## 2014-12-25 DIAGNOSIS — I10 Essential (primary) hypertension: Secondary | ICD-10-CM | POA: Diagnosis not present

## 2014-12-25 DIAGNOSIS — Z23 Encounter for immunization: Secondary | ICD-10-CM | POA: Diagnosis not present

## 2014-12-25 DIAGNOSIS — J309 Allergic rhinitis, unspecified: Secondary | ICD-10-CM | POA: Diagnosis not present

## 2014-12-25 DIAGNOSIS — J45909 Unspecified asthma, uncomplicated: Secondary | ICD-10-CM | POA: Diagnosis not present

## 2014-12-25 DIAGNOSIS — F3132 Bipolar disorder, current episode depressed, moderate: Secondary | ICD-10-CM | POA: Diagnosis not present

## 2014-12-25 DIAGNOSIS — E039 Hypothyroidism, unspecified: Secondary | ICD-10-CM | POA: Diagnosis not present

## 2015-01-04 DIAGNOSIS — Z79891 Long term (current) use of opiate analgesic: Secondary | ICD-10-CM | POA: Diagnosis not present

## 2015-01-04 DIAGNOSIS — M5134 Other intervertebral disc degeneration, thoracic region: Secondary | ICD-10-CM | POA: Diagnosis not present

## 2015-01-04 DIAGNOSIS — G894 Chronic pain syndrome: Secondary | ICD-10-CM | POA: Diagnosis not present

## 2015-01-07 DIAGNOSIS — H353231 Exudative age-related macular degeneration, bilateral, with active choroidal neovascularization: Secondary | ICD-10-CM | POA: Diagnosis not present

## 2015-01-08 ENCOUNTER — Other Ambulatory Visit: Payer: Self-pay

## 2015-01-08 DIAGNOSIS — D509 Iron deficiency anemia, unspecified: Secondary | ICD-10-CM

## 2015-02-14 DIAGNOSIS — H401132 Primary open-angle glaucoma, bilateral, moderate stage: Secondary | ICD-10-CM | POA: Diagnosis not present

## 2015-02-25 DIAGNOSIS — H353231 Exudative age-related macular degeneration, bilateral, with active choroidal neovascularization: Secondary | ICD-10-CM | POA: Diagnosis not present

## 2015-03-01 ENCOUNTER — Telehealth: Payer: Self-pay | Admitting: Internal Medicine

## 2015-03-01 NOTE — Telephone Encounter (Signed)
[phone]  THE PATIENT IS WANTING SAMPLES OF LINZESS   PLEASE ADVISE.

## 2015-03-04 ENCOUNTER — Ambulatory Visit: Payer: Medicare Other | Admitting: Gastroenterology

## 2015-03-04 NOTE — Telephone Encounter (Signed)
We are out of linzess 254mg. Pt is aware.

## 2015-03-06 NOTE — Telephone Encounter (Signed)
We got some samples in. Left #4 boxes of linzess 290 at the front desk. Called pt- LMOM

## 2015-03-11 ENCOUNTER — Emergency Department (HOSPITAL_COMMUNITY)
Admission: EM | Admit: 2015-03-11 | Discharge: 2015-03-11 | Disposition: A | Discharge status: Home / Self Care | Payer: Medicare Other | Attending: Emergency Medicine | Admitting: Emergency Medicine

## 2015-03-11 ENCOUNTER — Encounter (HOSPITAL_COMMUNITY): Payer: Self-pay | Admitting: *Deleted

## 2015-03-11 ENCOUNTER — Emergency Department (HOSPITAL_COMMUNITY): Payer: Medicare Other

## 2015-03-11 DIAGNOSIS — I10 Essential (primary) hypertension: Secondary | ICD-10-CM | POA: Diagnosis not present

## 2015-03-11 DIAGNOSIS — F329 Major depressive disorder, single episode, unspecified: Secondary | ICD-10-CM | POA: Insufficient documentation

## 2015-03-11 DIAGNOSIS — J9801 Acute bronchospasm: Secondary | ICD-10-CM | POA: Diagnosis not present

## 2015-03-11 DIAGNOSIS — Z79899 Other long term (current) drug therapy: Secondary | ICD-10-CM | POA: Diagnosis not present

## 2015-03-11 DIAGNOSIS — J40 Bronchitis, not specified as acute or chronic: Secondary | ICD-10-CM

## 2015-03-11 DIAGNOSIS — M797 Fibromyalgia: Secondary | ICD-10-CM | POA: Insufficient documentation

## 2015-03-11 DIAGNOSIS — H409 Unspecified glaucoma: Secondary | ICD-10-CM | POA: Insufficient documentation

## 2015-03-11 DIAGNOSIS — M79662 Pain in left lower leg: Secondary | ICD-10-CM | POA: Diagnosis not present

## 2015-03-11 DIAGNOSIS — M79661 Pain in right lower leg: Secondary | ICD-10-CM | POA: Diagnosis not present

## 2015-03-11 DIAGNOSIS — R0602 Shortness of breath: Secondary | ICD-10-CM | POA: Diagnosis present

## 2015-03-11 DIAGNOSIS — Z86018 Personal history of other benign neoplasm: Secondary | ICD-10-CM | POA: Diagnosis not present

## 2015-03-11 DIAGNOSIS — Z7951 Long term (current) use of inhaled steroids: Secondary | ICD-10-CM | POA: Insufficient documentation

## 2015-03-11 DIAGNOSIS — R51 Headache: Secondary | ICD-10-CM | POA: Insufficient documentation

## 2015-03-11 DIAGNOSIS — J45901 Unspecified asthma with (acute) exacerbation: Secondary | ICD-10-CM | POA: Insufficient documentation

## 2015-03-11 DIAGNOSIS — K219 Gastro-esophageal reflux disease without esophagitis: Secondary | ICD-10-CM | POA: Diagnosis not present

## 2015-03-11 DIAGNOSIS — E039 Hypothyroidism, unspecified: Secondary | ICD-10-CM | POA: Diagnosis not present

## 2015-03-11 DIAGNOSIS — Z79891 Long term (current) use of opiate analgesic: Secondary | ICD-10-CM | POA: Insufficient documentation

## 2015-03-11 DIAGNOSIS — M7989 Other specified soft tissue disorders: Secondary | ICD-10-CM | POA: Diagnosis not present

## 2015-03-11 DIAGNOSIS — Z8669 Personal history of other diseases of the nervous system and sense organs: Secondary | ICD-10-CM | POA: Insufficient documentation

## 2015-03-11 LAB — COMPREHENSIVE METABOLIC PANEL
ALBUMIN: 3.7 g/dL (ref 3.5–5.0)
ALK PHOS: 67 U/L (ref 38–126)
ALT: 18 U/L (ref 14–54)
AST: 28 U/L (ref 15–41)
Anion gap: 7 (ref 5–15)
BILIRUBIN TOTAL: 0.5 mg/dL (ref 0.3–1.2)
BUN: 12 mg/dL (ref 6–20)
CO2: 30 mmol/L (ref 22–32)
Calcium: 8.5 mg/dL — ABNORMAL LOW (ref 8.9–10.3)
Chloride: 98 mmol/L — ABNORMAL LOW (ref 101–111)
Creatinine, Ser: 0.85 mg/dL (ref 0.44–1.00)
GFR calc Af Amer: >60 mL/min (ref >60)
GFR calc non Af Amer: >60 mL/min (ref >60)
GLUCOSE: 104 mg/dL — AB (ref 65–99)
Potassium: 4.3 mmol/L (ref 3.5–5.1)
SODIUM: 135 mmol/L (ref 135–145)
Total Protein: 6.4 g/dL — ABNORMAL LOW (ref 6.5–8.1)

## 2015-03-11 LAB — CBC WITH DIFFERENTIAL/PLATELET
BASOS PCT: 0 %
Basophils Absolute: 0 10*3/uL (ref 0.0–0.1)
EOS ABS: 0.1 10*3/uL (ref 0.0–0.7)
Eosinophils Relative: 1 %
HEMATOCRIT: 32.9 % — AB (ref 36.0–46.0)
HEMOGLOBIN: 10.2 g/dL — AB (ref 12.0–15.0)
LYMPHS PCT: 9 %
Lymphs Abs: 0.9 10*3/uL (ref 0.7–4.0)
MCH: 27.3 pg (ref 26.0–34.0)
MCHC: 31 g/dL (ref 30.0–36.0)
MCV: 88.2 fL (ref 78.0–100.0)
MONO ABS: 0.9 10*3/uL (ref 0.1–1.0)
Monocytes Relative: 9 %
Neutro Abs: 8.4 10*3/uL — ABNORMAL HIGH (ref 1.7–7.7)
Neutrophils Relative %: 81 %
Platelets: 273 10*3/uL (ref 150–400)
RBC: 3.73 MIL/uL — AB (ref 3.87–5.11)
RDW: 15 % (ref 11.5–15.5)
WBC: 10.3 10*3/uL (ref 4.0–10.5)

## 2015-03-11 LAB — BRAIN NATRIURETIC PEPTIDE: B NATRIURETIC PEPTIDE 5: 135 pg/mL — AB (ref 0.0–100.0)

## 2015-03-11 LAB — TROPONIN I: Troponin I: <0.03 ng/mL (ref <0.031)

## 2015-03-11 MED ORDER — AZITHROMYCIN 250 MG PO TABS: ORAL_TABLET | ORAL | Status: DC

## 2015-03-11 MED ORDER — ALBUTEROL SULFATE (2.5 MG/3ML) 0.083% IN NEBU
5.0000 mg | INHALATION_SOLUTION | Freq: Once | RESPIRATORY_TRACT | Status: AC
Start: 2015-03-11 — End: 2015-03-11
  Administered 2015-03-11: 5 mg via RESPIRATORY_TRACT
  Filled 2015-03-11: qty 6

## 2015-03-11 MED ORDER — METHYLPREDNISOLONE SODIUM SUCC 125 MG IJ SOLR
125.0000 mg | Freq: Once | INTRAMUSCULAR | Status: AC
  Administered 2015-03-11: 125 mg via INTRAVENOUS
  Filled 2015-03-11: qty 2

## 2015-03-11 MED ORDER — PREDNISONE 20 MG PO TABS: ORAL_TABLET | ORAL | Status: DC

## 2015-03-11 MED ORDER — ALBUTEROL SULFATE (2.5 MG/3ML) 0.083% IN NEBU: 2.5000 mg | INHALATION_SOLUTION | Freq: Once | RESPIRATORY_TRACT | Status: DC

## 2015-03-11 MED ORDER — IPRATROPIUM-ALBUTEROL 0.5-2.5 (3) MG/3ML IN SOLN
3.0000 mL | Freq: Once | RESPIRATORY_TRACT | Status: AC
  Administered 2015-03-11: 3 mL via RESPIRATORY_TRACT
  Filled 2015-03-11: qty 3

## 2015-03-11 NOTE — ED Provider Notes (Signed)
CSN: [id number]     Arrival date & time 03/11/15  1116 History  By signing my name below, I, Hilda Lias, attest that this documentation has been prepared under the direction and in the presence of Milton Ferguson, MD. Electronically Signed: Hilda Lias, ED Scribe. 03/11/2015. 12:52 PM.    Chief Complaint  Patient presents with  . Shortness of Breath     Patient is a 70 y.o. female presenting with shortness of breath. The history is provided by the patient. No language interpreter was used.  Shortness of Breath Severity:  Mild Onset quality:  Gradual Duration:  1 week Timing:  Constant Progression:  Worsening Chronicity:  New Relieved by:  Nothing Ineffective treatments:  Inhaler Associated symptoms: cough and headaches   Associated symptoms: no abdominal pain, no chest pain and no rash    HPI Comments: Heidi Fuentes is a 70 y.o. female with a hx of asthma who presents to the Emergency Department complaining of constant, worsening SOB that has been present for a week. Pt reports to ED today with associated bilateral leg swelling, constant generalized headache, constant pain in both of her lower legs, and a productive cough with green and yellow sputum. Pt states she has had leg swelling for two weeks. Pt states she used her inhaler last night with no relief. Pt states she is not a smoker.   Past Medical History  Diagnosis Date  . Fibromyalgia   . Depression   . Back pain   . Asthma   . Chronic pain syndrome 07/10/2013  . Duodenal papillary stenosis   . Macular degeneration   . Glaucoma   . 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 GERD (gastroesophageal reflux disease)   . Hypertension   . Tubular adenoma   . Esophageal dysmotility   . Thyroid disease   . Hypothyroidism    Past Surgical History  Procedure Laterality Date  . Tonsillectomy    . Hemorroidectomy    . Hernia repair    . Coccyx removal    . Colonoscopy  2005    Dr. Laural Golden: mild melanosis coli, otherwise  normal  . Ercp  1997    [facility]: biliary manometry abnormal, subsequent sphincterotomy   . Foot surgery      X2  . Colonoscopy with propofol N/A 09/14/2013    NTZ:GYFVCBSWH coli. Colonic diverticulosis. Single colonic. Tubular adenoma. Next TCS 09/2020.  [name] Esophagogastroduodenoscopy (egd) with propofol N/A 09/14/2013    QPR:[id number] ring. Hiatal hernia. Status post Venia Minks and biopsy disruption.   Venia Minks dilation N/A 09/14/2013    Procedure: Venia Minks DILATION;  Surgeon: [name], MD;  Location: [facility];  Service: Endoscopy;  Laterality: N/A;  56  . Polypectomy N/A 09/14/2013    Procedure: POLYPECTOMY;  Surgeon: [name], MD;  Location: [facility];  Service: Endoscopy;  Laterality: N/A;   Family History  Problem Relation Age of Onset  . Colon cancer Neg Hx   . Lung cancer Mother   . Heart attack Father   . Bone cancer Brother   . COPD Sister    Social History  Substance Use Topics  . Smoking status: Never Smoker   . Smokeless tobacco: None     Comment: NEVER SMOKED  . Alcohol Use: No   OB History    Gravida Para Term Preterm AB TAB SAB Ectopic Multiple Living   '2 2 2            '$ Review of Systems  Constitutional:  Negative for appetite change and fatigue.  HENT: Negative for congestion, ear discharge and sinus pressure.   Eyes: Negative for discharge.  Respiratory: Positive for cough and shortness of breath.   Cardiovascular: Positive for leg swelling. Negative for chest pain.  Gastrointestinal: Negative for abdominal pain and diarrhea.  Genitourinary: Negative for frequency and hematuria.  Musculoskeletal: Negative for back pain.  Skin: Negative for rash.  Neurological: Positive for headaches. Negative for seizures.  Psychiatric/Behavioral: Negative for hallucinations.      Allergies  Sulfa antibiotics  Home Medications   Prior to Admission medications   Medication Sig Start Date End Date Taking? Authorizing Provider  acetaminophen (TYLENOL) 500 MG tablet  Take 500 mg by mouth every 6 (six) hours as needed. For pain    Yes Historical Provider, MD  albuterol (VENTOLIN HFA) 108 (90 BASE) MCG/ACT inhaler Inhale 2 puffs into the lungs every 3 (three) hours as needed for wheezing or shortness of breath. For shortness of breath   Yes Historical Provider, MD  cyclobenzaprine (FLEXERIL) 10 MG tablet Take 10 mg by mouth at bedtime.   Yes Historical Provider, MD  FLUoxetine (PROZAC) 20 MG tablet Take 20 mg by mouth daily.   Yes Historical Provider, MD  fluticasone (FLONASE) 50 MCG/ACT nasal spray Place 2 sprays into both nostrils daily.   Yes Historical Provider, MD  furosemide (LASIX) 40 MG tablet Take 40 mg by mouth daily. 04/18/13  Yes Historical Provider, MD  HYDROcodone-acetaminophen (NORCO) 10-325 MG per tablet Take 1 tablet by mouth every 4 (four) hours as needed. pain 06/15/13  Yes Historical Provider, MD  lamoTRIgine (LAMICTAL) 150 MG tablet Take 150 mg by mouth 2 (two) times daily.   Yes Historical Provider, MD  latanoprost (XALATAN) 0.005 % ophthalmic solution Place 1 drop into both eyes at bedtime. 04/20/13  Yes Historical Provider, MD  levothyroxine (SYNTHROID, LEVOTHROID) 50 MCG tablet Take 50 mcg by mouth daily.     Yes Historical Provider, MD  Linaclotide (LINZESS) 290 MCG CAPS capsule Take 1 capsule (290 mcg total) by mouth daily. Patient taking differently: Take 290 mcg by mouth daily as needed (for constipation).  11/27/14  Yes [name], NP  meclizine (ANTIVERT) 50 MG tablet Take 1 tablet (50 mg total) by mouth 3 (three) times daily as needed for dizziness. 12/11/14  Yes Forde Dandy, MD  metoprolol succinate (TOPROL-XL) 50 MG 24 hr tablet Take 50 mg by mouth daily. 04/18/13  Yes Historical Provider, MD  montelukast (SINGULAIR) 10 MG tablet Take 10 mg by mouth daily. 05/03/13  Yes Historical Provider, MD  morphine (MS CONTIN) 15 MG 12 hr tablet Take 15 mg by mouth 2 (two) times daily. 03/05/15  Yes Historical Provider, MD  Multiple  Vitamins-Minerals (PRESERVISION/LUTEIN PO) Take 1 tablet by mouth daily.   Yes Historical Provider, MD  pantoprazole (PROTONIX) 40 MG tablet Take 1 tablet (40 mg total) by mouth daily. 11/07/14  Yes Carlis Stable, NP  QUEtiapine (SEROQUEL) 50 MG tablet Take 50 mg by mouth at bedtime. 04/18/13  Yes Historical Provider, MD  cephALEXin (KEFLEX) 500 MG capsule Take 1 capsule (500 mg total) by mouth 4 (four) times daily. Patient not taking: Reported on 03/11/2015 12/11/14   Forde Dandy, MD  polyethylene glycol [facility] / Floria Raveling) packet Take 17 g by mouth 2 (two) times daily. Patient not taking: Reported on 03/11/2015 07/31/13   [name], NP  Wheat Dextrin (BENEFIBER) POWD 2 teaspoons added to food/water three times daily. Start with  once daily and increase to three times daily over the next 5 days. Patient not taking: Reported on 11/27/2014 12/15/13   [name], PA-C   BP 97/53 mmHg  Pulse 89  Temp(Src) 99.9 F (37.7 C) (Tympanic)  Resp 18  Ht 5' (1.524 m)  Wt 140 lb (63.504 kg)  BMI 27.34 kg/m2  SpO2 91% Physical Exam  Constitutional: She is oriented to person, place, and time. She appears well-developed.  HENT:  Head: Normocephalic.  Eyes: Conjunctivae and EOM are normal. No scleral icterus.  Neck: Neck supple. No thyromegaly present.  Cardiovascular: Normal rate and regular rhythm.  Exam reveals no gallop and no friction rub.   No murmur heard. Pulmonary/Chest: No stridor. She has wheezes. She has no rales. She exhibits no tenderness.  Moderate wheezing   Abdominal: She exhibits no distension. There is no tenderness. There is no rebound.  Musculoskeletal: Normal range of motion. She exhibits no edema.  2+ edema in lower extremities   Lymphadenopathy:    She has no cervical adenopathy.  Neurological: She is oriented to person, place, and time. She exhibits normal muscle tone. Coordination normal.  Skin: No rash noted. No erythema.  Psychiatric: She has a normal mood and affect. Her  behavior is normal.    ED Course  Procedures (including critical care time)  DIAGNOSTIC STUDIES: Oxygen Saturation is 97% on room air, normal by my interpretation.    COORDINATION OF CARE: 12:51 PM Discussed treatment plan with pt at bedside and pt agreed to plan.   Labs Review Labs Reviewed  CBC WITH DIFFERENTIAL/PLATELET - Abnormal; Notable for the following:    RBC 3.73 (*)    Hemoglobin 10.2 (*)    HCT 32.9 (*)    Neutro Abs 8.4 (*)    All other components within normal limits  BRAIN NATRIURETIC PEPTIDE  COMPREHENSIVE METABOLIC PANEL    Imaging Review Dg Chest 2 View  03/11/2015  CLINICAL DATA:  Congestion and shortness of breath. EXAM: CHEST  2 VIEW COMPARISON:  May 29, 2012 FINDINGS: No pneumothorax. There is a large hiatal hernia behind the heart. The heart, hila, and mediastinum are unremarkable. No suspicious pulmonary nodules, masses, or infiltrates. No overt edema. Platelike opacity along the posterior aspect of the hiatal hernia on the lateral view is consistent with atelectasis. IMPRESSION: No active cardiopulmonary disease. Electronically Signed   By: Dorise Bullion III M.D   On: 03/11/2015 12:17   I have personally reviewed and evaluated these images and lab results as part of my medical decision-making.   EKG Interpretation None      MDM   Final diagnoses:  None    Patient with bronchitis and bronchospasm with mild edema to her ankles. She will be put on Z-Pak short course prednisone and will use her inhaler 4 times a day and increase her Lasix for a couple days. She will follow-up with PCP if not improving this week.   The chart was scribed for me under my direct supervision.  I personally performed the history, physical, and medical decision making and all procedures in the evaluation of this patient.Milton Ferguson, MD 03/11/15 [phone]

## 2015-03-11 NOTE — ED Notes (Signed)
Pt c/o cough and shortness of breath x 1 week. Reports hx of asthma, used inhaler last night. Also reports leg swelling x 2 weeks.

## 2015-03-11 NOTE — ED Notes (Signed)
MD at bedside. 

## 2015-03-11 NOTE — Discharge Instructions (Signed)
Increase her Lasix to 80 mg a day for 2 days. Use your inhaler every 4-6 hours as needed and follow-up with her doctor later this week for recheck

## 2015-03-15 ENCOUNTER — Telehealth: Payer: Self-pay | Admitting: Internal Medicine

## 2015-03-15 NOTE — Telephone Encounter (Signed)
Pt called to say she would be by Monday to pick up her samples. She also said that she never got her labs done because of the snow and we didn't tell her when to go back to have them done. She has OV on 2/15 and wanted to know could she go then to have them done. Please advise and call her at [phone]

## 2015-03-18 NOTE — Telephone Encounter (Signed)
Spoke with the pt, she thought you had to have an appt to have the blood work done. Explained to her that she can go anytime and have it done without an appt. Pt said she still has bronchitis and she wants to cancel her appt this week. She is going to have the bloodwork done as soon as she feels better and will call back to reschedule her ov.   Please cancel ov.

## 2015-03-18 NOTE — Telephone Encounter (Signed)
OV cancelled °

## 2015-03-20 ENCOUNTER — Ambulatory Visit: Payer: Medicare Other | Admitting: Gastroenterology

## 2015-03-21 DIAGNOSIS — R0601 Orthopnea: Secondary | ICD-10-CM | POA: Diagnosis not present

## 2015-03-21 DIAGNOSIS — R062 Wheezing: Secondary | ICD-10-CM | POA: Diagnosis not present

## 2015-03-21 DIAGNOSIS — R0609 Other forms of dyspnea: Secondary | ICD-10-CM | POA: Diagnosis not present

## 2015-03-26 ENCOUNTER — Ambulatory Visit (HOSPITAL_COMMUNITY)
Admission: RE | Admit: 2015-03-26 | Discharge: 2015-03-26 | Disposition: A | Discharge status: Home / Self Care | Payer: Medicare Other | Source: Ambulatory Visit | Attending: Family Medicine | Admitting: Family Medicine

## 2015-03-26 ENCOUNTER — Telehealth: Payer: Self-pay | Admitting: Internal Medicine

## 2015-03-26 DIAGNOSIS — K219 Gastro-esophageal reflux disease without esophagitis: Secondary | ICD-10-CM | POA: Insufficient documentation

## 2015-03-26 DIAGNOSIS — I081 Rheumatic disorders of both mitral and tricuspid valves: Secondary | ICD-10-CM | POA: Diagnosis not present

## 2015-03-26 DIAGNOSIS — R0601 Orthopnea: Secondary | ICD-10-CM | POA: Diagnosis not present

## 2015-03-26 DIAGNOSIS — R609 Edema, unspecified: Secondary | ICD-10-CM | POA: Diagnosis not present

## 2015-03-26 DIAGNOSIS — I371 Nonrheumatic pulmonary valve insufficiency: Secondary | ICD-10-CM | POA: Diagnosis not present

## 2015-03-26 DIAGNOSIS — R0600 Dyspnea, unspecified: Secondary | ICD-10-CM | POA: Diagnosis not present

## 2015-03-26 DIAGNOSIS — I10 Essential (primary) hypertension: Secondary | ICD-10-CM | POA: Insufficient documentation

## 2015-03-26 NOTE — Telephone Encounter (Signed)
Lab order mailed to the pt.

## 2015-03-26 NOTE — Telephone Encounter (Signed)
PATIENT NEEDS ANOTHER LAB ORDER FOE FERRATIN MAILED TO HER HOUSE.

## 2015-04-08 DIAGNOSIS — J45901 Unspecified asthma with (acute) exacerbation: Secondary | ICD-10-CM | POA: Diagnosis not present

## 2015-04-08 DIAGNOSIS — R05 Cough: Secondary | ICD-10-CM | POA: Diagnosis not present

## 2015-04-12 ENCOUNTER — Ambulatory Visit: Payer: Medicare Other | Admitting: Gastroenterology

## 2015-04-19 DIAGNOSIS — Z79891 Long term (current) use of opiate analgesic: Secondary | ICD-10-CM | POA: Diagnosis not present

## 2015-04-19 DIAGNOSIS — G894 Chronic pain syndrome: Secondary | ICD-10-CM | POA: Diagnosis not present

## 2015-04-19 DIAGNOSIS — M5134 Other intervertebral disc degeneration, thoracic region: Secondary | ICD-10-CM | POA: Diagnosis not present

## 2015-04-22 DIAGNOSIS — D509 Iron deficiency anemia, unspecified: Secondary | ICD-10-CM | POA: Diagnosis not present

## 2015-04-22 DIAGNOSIS — C169 Malignant neoplasm of stomach, unspecified: Secondary | ICD-10-CM | POA: Diagnosis not present

## 2015-04-23 LAB — CBC WITH DIFFERENTIAL/PLATELET
BASOS PCT: 0 % (ref 0–1)
Basophils Absolute: 0 10*3/uL (ref 0.0–0.1)
Eosinophils Absolute: 0.4 10*3/uL (ref 0.0–0.7)
Eosinophils Relative: 4 % (ref 0–5)
HCT: 36.9 % (ref 36.0–46.0)
Hemoglobin: 11.4 g/dL — ABNORMAL LOW (ref 12.0–15.0)
Lymphocytes Relative: 29 % (ref 12–46)
Lymphs Abs: 2.9 10*3/uL (ref 0.7–4.0)
MCH: 24.7 pg — AB (ref 26.0–34.0)
MCHC: 30.9 g/dL (ref 30.0–36.0)
MCV: 79.9 fL (ref 78.0–100.0)
MONO ABS: 0.7 10*3/uL (ref 0.1–1.0)
MPV: 9.9 fL (ref 8.6–12.4)
Monocytes Relative: 7 % (ref 3–12)
NEUTROS ABS: 5.9 10*3/uL (ref 1.7–7.7)
Neutrophils Relative %: 60 % (ref 43–77)
PLATELETS: 592 10*3/uL — AB (ref 150–400)
RBC: 4.62 MIL/uL (ref 3.87–5.11)
RDW: 16.2 % — ABNORMAL HIGH (ref 11.5–15.5)
WBC: 9.9 10*3/uL (ref 4.0–10.5)

## 2015-04-23 LAB — IRON AND TIBC
%SAT: 8 % — AB (ref 11–50)
IRON: 37 ug/dL — AB (ref 45–160)
TIBC: 444 ug/dL (ref 250–450)
UIBC: 407 ug/dL — ABNORMAL HIGH (ref 125–400)

## 2015-04-23 LAB — FERRITIN: Ferritin: 21 ng/mL (ref 20–288)

## 2015-04-24 DIAGNOSIS — H353231 Exudative age-related macular degeneration, bilateral, with active choroidal neovascularization: Secondary | ICD-10-CM | POA: Diagnosis not present

## 2015-04-30 NOTE — Progress Notes (Signed)
Quick Note:  Ferritin improved, iron lower at 37. Very mild drop in Hgb. Platelets elevated. Needs to come in for an OV. Is she taking iron ______

## 2015-06-05 DIAGNOSIS — J301 Allergic rhinitis due to pollen: Secondary | ICD-10-CM | POA: Diagnosis not present

## 2015-06-05 DIAGNOSIS — Z79899 Other long term (current) drug therapy: Secondary | ICD-10-CM | POA: Diagnosis not present

## 2015-06-05 DIAGNOSIS — Z0000 Encounter for general adult medical examination without abnormal findings: Secondary | ICD-10-CM | POA: Diagnosis not present

## 2015-06-05 DIAGNOSIS — D649 Anemia, unspecified: Secondary | ICD-10-CM | POA: Diagnosis not present

## 2015-06-05 DIAGNOSIS — M5030 Other cervical disc degeneration, unspecified cervical region: Secondary | ICD-10-CM | POA: Diagnosis not present

## 2015-06-05 DIAGNOSIS — I10 Essential (primary) hypertension: Secondary | ICD-10-CM | POA: Diagnosis not present

## 2015-06-05 DIAGNOSIS — E039 Hypothyroidism, unspecified: Secondary | ICD-10-CM | POA: Diagnosis not present

## 2015-06-05 DIAGNOSIS — F339 Major depressive disorder, recurrent, unspecified: Secondary | ICD-10-CM | POA: Diagnosis not present

## 2015-06-05 DIAGNOSIS — N3001 Acute cystitis with hematuria: Secondary | ICD-10-CM | POA: Diagnosis not present

## 2015-06-12 DIAGNOSIS — H401132 Primary open-angle glaucoma, bilateral, moderate stage: Secondary | ICD-10-CM | POA: Diagnosis not present

## 2015-06-14 DIAGNOSIS — H353231 Exudative age-related macular degeneration, bilateral, with active choroidal neovascularization: Secondary | ICD-10-CM | POA: Diagnosis not present

## 2015-06-17 DIAGNOSIS — F339 Major depressive disorder, recurrent, unspecified: Secondary | ICD-10-CM | POA: Diagnosis not present

## 2015-07-15 DIAGNOSIS — M5134 Other intervertebral disc degeneration, thoracic region: Secondary | ICD-10-CM | POA: Diagnosis not present

## 2015-07-15 DIAGNOSIS — Z13220 Encounter for screening for lipoid disorders: Secondary | ICD-10-CM | POA: Diagnosis not present

## 2015-07-15 DIAGNOSIS — Z1239 Encounter for other screening for malignant neoplasm of breast: Secondary | ICD-10-CM | POA: Diagnosis not present

## 2015-07-15 DIAGNOSIS — G8929 Other chronic pain: Secondary | ICD-10-CM | POA: Diagnosis not present

## 2015-07-15 DIAGNOSIS — Z13820 Encounter for screening for osteoporosis: Secondary | ICD-10-CM | POA: Diagnosis not present

## 2015-07-15 DIAGNOSIS — M5030 Other cervical disc degeneration, unspecified cervical region: Secondary | ICD-10-CM | POA: Diagnosis not present

## 2015-07-15 DIAGNOSIS — E785 Hyperlipidemia, unspecified: Secondary | ICD-10-CM | POA: Diagnosis not present

## 2015-07-15 DIAGNOSIS — F339 Major depressive disorder, recurrent, unspecified: Secondary | ICD-10-CM | POA: Diagnosis not present

## 2015-07-17 ENCOUNTER — Other Ambulatory Visit (HOSPITAL_COMMUNITY): Payer: Self-pay | Admitting: Nurse Practitioner

## 2015-07-17 DIAGNOSIS — Z1231 Encounter for screening mammogram for malignant neoplasm of breast: Secondary | ICD-10-CM

## 2015-07-31 ENCOUNTER — Ambulatory Visit (HOSPITAL_COMMUNITY): Payer: Medicare Other

## 2015-08-01 ENCOUNTER — Ambulatory Visit (HOSPITAL_COMMUNITY)
Admission: RE | Admit: 2015-08-01 | Discharge: 2015-08-01 | Disposition: A | Discharge status: Home / Self Care | Payer: Medicare Other | Source: Ambulatory Visit | Attending: Nurse Practitioner | Admitting: Nurse Practitioner

## 2015-08-01 DIAGNOSIS — Z1231 Encounter for screening mammogram for malignant neoplasm of breast: Secondary | ICD-10-CM

## 2015-08-09 DIAGNOSIS — H353213 Exudative age-related macular degeneration, right eye, with inactive scar: Secondary | ICD-10-CM | POA: Diagnosis not present

## 2015-08-09 DIAGNOSIS — H353231 Exudative age-related macular degeneration, bilateral, with active choroidal neovascularization: Secondary | ICD-10-CM | POA: Diagnosis not present

## 2015-08-23 DIAGNOSIS — M899 Disorder of bone, unspecified: Secondary | ICD-10-CM | POA: Diagnosis not present

## 2015-09-03 DIAGNOSIS — W19XXXA Unspecified fall, initial encounter: Secondary | ICD-10-CM | POA: Diagnosis not present

## 2015-09-03 DIAGNOSIS — B351 Tinea unguium: Secondary | ICD-10-CM | POA: Diagnosis not present

## 2015-09-03 DIAGNOSIS — R600 Localized edema: Secondary | ICD-10-CM | POA: Diagnosis not present

## 2015-09-03 DIAGNOSIS — T148 Other injury of unspecified body region: Secondary | ICD-10-CM | POA: Diagnosis not present

## 2015-09-06 ENCOUNTER — Emergency Department (HOSPITAL_COMMUNITY): Payer: Medicare Other

## 2015-09-06 ENCOUNTER — Encounter (HOSPITAL_COMMUNITY): Payer: Self-pay | Admitting: Emergency Medicine

## 2015-09-06 ENCOUNTER — Observation Stay (HOSPITAL_COMMUNITY)
Admission: EM | Admit: 2015-09-06 | Discharge: 2015-09-07 | Disposition: A | Discharge status: Home / Self Care | Payer: Medicare Other | Attending: Internal Medicine | Admitting: Internal Medicine

## 2015-09-06 DIAGNOSIS — I10 Essential (primary) hypertension: Secondary | ICD-10-CM | POA: Insufficient documentation

## 2015-09-06 DIAGNOSIS — J45909 Unspecified asthma, uncomplicated: Secondary | ICD-10-CM | POA: Diagnosis present

## 2015-09-06 DIAGNOSIS — G894 Chronic pain syndrome: Secondary | ICD-10-CM | POA: Diagnosis not present

## 2015-09-06 DIAGNOSIS — M549 Dorsalgia, unspecified: Secondary | ICD-10-CM | POA: Insufficient documentation

## 2015-09-06 DIAGNOSIS — W19XXXS Unspecified fall, sequela: Secondary | ICD-10-CM | POA: Diagnosis not present

## 2015-09-06 DIAGNOSIS — Y9301 Activity, walking, marching and hiking: Secondary | ICD-10-CM | POA: Diagnosis not present

## 2015-09-06 DIAGNOSIS — Y9289 Other specified places as the place of occurrence of the external cause: Secondary | ICD-10-CM | POA: Diagnosis not present

## 2015-09-06 DIAGNOSIS — M25551 Pain in right hip: Secondary | ICD-10-CM | POA: Diagnosis not present

## 2015-09-06 DIAGNOSIS — M542 Cervicalgia: Secondary | ICD-10-CM | POA: Diagnosis present

## 2015-09-06 DIAGNOSIS — W19XXXA Unspecified fall, initial encounter: Secondary | ICD-10-CM | POA: Insufficient documentation

## 2015-09-06 DIAGNOSIS — R404 Transient alteration of awareness: Secondary | ICD-10-CM | POA: Diagnosis not present

## 2015-09-06 DIAGNOSIS — E876 Hypokalemia: Secondary | ICD-10-CM | POA: Diagnosis present

## 2015-09-06 DIAGNOSIS — I4581 Long QT syndrome: Secondary | ICD-10-CM

## 2015-09-06 DIAGNOSIS — E039 Hypothyroidism, unspecified: Secondary | ICD-10-CM | POA: Diagnosis not present

## 2015-09-06 DIAGNOSIS — S99911A Unspecified injury of right ankle, initial encounter: Secondary | ICD-10-CM | POA: Diagnosis not present

## 2015-09-06 DIAGNOSIS — W1839XA Other fall on same level, initial encounter: Secondary | ICD-10-CM | POA: Diagnosis not present

## 2015-09-06 DIAGNOSIS — M25552 Pain in left hip: Secondary | ICD-10-CM | POA: Diagnosis not present

## 2015-09-06 DIAGNOSIS — F329 Major depressive disorder, single episode, unspecified: Secondary | ICD-10-CM | POA: Diagnosis present

## 2015-09-06 DIAGNOSIS — S99912A Unspecified injury of left ankle, initial encounter: Secondary | ICD-10-CM | POA: Diagnosis not present

## 2015-09-06 DIAGNOSIS — M25572 Pain in left ankle and joints of left foot: Secondary | ICD-10-CM | POA: Diagnosis not present

## 2015-09-06 DIAGNOSIS — S0990XA Unspecified injury of head, initial encounter: Secondary | ICD-10-CM | POA: Insufficient documentation

## 2015-09-06 DIAGNOSIS — M25571 Pain in right ankle and joints of right foot: Secondary | ICD-10-CM | POA: Insufficient documentation

## 2015-09-06 DIAGNOSIS — Y999 Unspecified external cause status: Secondary | ICD-10-CM | POA: Diagnosis not present

## 2015-09-06 DIAGNOSIS — D509 Iron deficiency anemia, unspecified: Secondary | ICD-10-CM | POA: Diagnosis present

## 2015-09-06 DIAGNOSIS — S199XXA Unspecified injury of neck, initial encounter: Secondary | ICD-10-CM | POA: Diagnosis not present

## 2015-09-06 DIAGNOSIS — R9431 Abnormal electrocardiogram [ECG] [EKG]: Secondary | ICD-10-CM | POA: Diagnosis present

## 2015-09-06 DIAGNOSIS — S3993XA Unspecified injury of pelvis, initial encounter: Secondary | ICD-10-CM | POA: Diagnosis not present

## 2015-09-06 DIAGNOSIS — S299XXA Unspecified injury of thorax, initial encounter: Secondary | ICD-10-CM | POA: Diagnosis not present

## 2015-09-06 DIAGNOSIS — F319 Bipolar disorder, unspecified: Secondary | ICD-10-CM | POA: Diagnosis present

## 2015-09-06 DIAGNOSIS — R42 Dizziness and giddiness: Secondary | ICD-10-CM | POA: Diagnosis not present

## 2015-09-06 LAB — URINALYSIS, ROUTINE W REFLEX MICROSCOPIC
Bilirubin Urine: NEGATIVE
GLUCOSE, UA: NEGATIVE mg/dL
Hgb urine dipstick: NEGATIVE
Ketones, ur: NEGATIVE mg/dL
Nitrite: NEGATIVE
PH: 6 (ref 5.0–8.0)
Protein, ur: NEGATIVE mg/dL
Specific Gravity, Urine: 1.01 (ref 1.005–1.030)

## 2015-09-06 LAB — CBC WITH DIFFERENTIAL/PLATELET
BASOS ABS: 0 10*3/uL (ref 0.0–0.1)
Basophils Relative: 0 %
Eosinophils Absolute: 0.1 10*3/uL (ref 0.0–0.7)
Eosinophils Relative: 1 %
HEMATOCRIT: 32.6 % — AB (ref 36.0–46.0)
Hemoglobin: 10.8 g/dL — ABNORMAL LOW (ref 12.0–15.0)
LYMPHS ABS: 1.6 10*3/uL (ref 0.7–4.0)
Lymphocytes Relative: 18 %
MCH: 29 pg (ref 26.0–34.0)
MCHC: 33.1 g/dL (ref 30.0–36.0)
MCV: 87.4 fL (ref 78.0–100.0)
Monocytes Absolute: 0.8 10*3/uL (ref 0.1–1.0)
Monocytes Relative: 9 %
NEUTROS ABS: 6.4 10*3/uL (ref 1.7–7.7)
NEUTROS PCT: 72 %
Platelets: 321 10*3/uL (ref 150–400)
RBC: 3.73 MIL/uL — ABNORMAL LOW (ref 3.87–5.11)
RDW: 17.1 % — AB (ref 11.5–15.5)
WBC: 8.9 10*3/uL (ref 4.0–10.5)

## 2015-09-06 LAB — MAGNESIUM: Magnesium: 2.1 mg/dL (ref 1.7–2.4)

## 2015-09-06 LAB — BASIC METABOLIC PANEL
Anion gap: 9 (ref 5–15)
BUN: 16 mg/dL (ref 6–20)
CALCIUM: 8.8 mg/dL — AB (ref 8.9–10.3)
CHLORIDE: 94 mmol/L — AB (ref 101–111)
CO2: 31 mmol/L (ref 22–32)
CREATININE: 0.97 mg/dL (ref 0.44–1.00)
GFR calc non Af Amer: 58 mL/min — ABNORMAL LOW (ref >60)
GLUCOSE: 107 mg/dL — AB (ref 65–99)
Potassium: 2.7 mmol/L — CL (ref 3.5–5.1)
Sodium: 134 mmol/L — ABNORMAL LOW (ref 135–145)

## 2015-09-06 LAB — URINE MICROSCOPIC-ADD ON: RBC / HPF: NONE SEEN RBC/hpf (ref 0–5)

## 2015-09-06 MED ORDER — POTASSIUM CHLORIDE 10 MEQ/100ML IV SOLN
10.0000 meq | Freq: Once | INTRAVENOUS | Status: AC
  Administered 2015-09-06: 10 meq via INTRAVENOUS
  Filled 2015-09-06: qty 100

## 2015-09-06 MED ORDER — MORPHINE SULFATE ER 15 MG PO TBCR
15.0000 mg | EXTENDED_RELEASE_TABLET | Freq: Two times a day (BID) | ORAL | Status: DC
  Administered 2015-09-06: 15 mg via ORAL
  Filled 2015-09-06: qty 1

## 2015-09-06 MED ORDER — MORPHINE SULFATE (PF) 4 MG/ML IV SOLN
4.0000 mg | Freq: Once | INTRAVENOUS | Status: AC
  Administered 2015-09-06: 4 mg via INTRAVENOUS
  Filled 2015-09-06: qty 1

## 2015-09-06 MED ORDER — METOPROLOL SUCCINATE ER 50 MG PO TB24: 50.0000 mg | ORAL_TABLET | Freq: Every day | ORAL | Status: DC

## 2015-09-06 MED ORDER — POTASSIUM CHLORIDE 10 MEQ/100ML IV SOLN
10.0000 meq | INTRAVENOUS | Status: AC
  Administered 2015-09-06 – 2015-09-07 (×3): 10 meq via INTRAVENOUS
  Filled 2015-09-06: qty 100

## 2015-09-06 MED ORDER — POTASSIUM CHLORIDE CRYS ER 20 MEQ PO TBCR
40.0000 meq | EXTENDED_RELEASE_TABLET | Freq: Once | ORAL | Status: AC
  Administered 2015-09-06: 40 meq via ORAL
  Filled 2015-09-06: qty 2

## 2015-09-06 MED ORDER — LEVOTHYROXINE SODIUM 50 MCG PO TABS
50.0000 ug | ORAL_TABLET | Freq: Every day | ORAL | Status: DC
  Administered 2015-09-07: 50 ug via ORAL
  Filled 2015-09-06: qty 1

## 2015-09-06 MED ORDER — POTASSIUM CHLORIDE IN NACL 40-0.9 MEQ/L-% IV SOLN
INTRAVENOUS | Status: AC
  Administered 2015-09-06: 75 mL/h via INTRAVENOUS

## 2015-09-06 NOTE — ED Triage Notes (Signed)
Pt brought in by EMS for a fall this am. Pt stepped in a hole and fell. Bruising to buttocks. Per reports pt has had several falls over the past few days. CBG 135. Stroke screen negative.

## 2015-09-06 NOTE — ED Notes (Signed)
Pt c/o severe burning at the IV site with K+ infusion. Fluids added, pt still unable to tolerate. Dr. Laverta Baltimore informed, ok to stop infusion.

## 2015-09-06 NOTE — ED Notes (Signed)
Pt in Radiology, unable to reassess pain at this time.

## 2015-09-06 NOTE — ED Notes (Signed)
OK to do In and out cath per Dr. Laverta Baltimore.

## 2015-09-06 NOTE — ED Provider Notes (Signed)
Emergency Department Provider Note   I have reviewed the triage vital signs and the nursing notes.   HISTORY  Chief Complaint Fall   HPI Heidi Fuentes is a 70 y.o. female with PMH of chronic back pain, depression, asthma, GERD, HTN, and Hypothyroidism presents to the emergency department for evaluation of multiple painful areas after an apparent mechanical fall. The patient was walking in her front guard when she suddenly fell forwards and then backwards. Patient believes she stepped in hole which caused her fall. She denies any preceding presyncope, chest pain, palpitations, difficulty breathing. She is on multiple medications for pain but states that these were not increased recently. After falling she had severe pain in her mid to the upper back. Had some mild headache. No loss of consciousness. She is complaining of left shoulder discomfort as well which was a source of chronic pain prior to the fall but has now been exacerbated. She denies any abdominal discomfort. She also has pain in both ankles with the left being more painful than the right. No knee or hip pain.    Past Medical History:  Diagnosis Date  . Asthma   . Back pain   . Chronic pain syndrome 07/10/2013  . Depression   . Duodenal papillary stenosis   . Esophageal dysmotility   . Fibromyalgia   . GERD (gastroesophageal reflux disease)   . Glaucoma   . Hypertension   . Hypothyroidism   . Macular degeneration   . Thyroid disease   . Tubular adenoma   . 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 IDA (iron deficiency anemia) 12/15/2013  . Schatzki's ring 08/17/2013  . Anemia 08/17/2013  . Encounter for screening colonoscopy 08/17/2013  . Constipation 07/10/2013  . Chronic pain syndrome 07/10/2013  . Depression 07/10/2013  . Hypothyroid 07/09/2013  . Colitis 07/09/2013  . Asthma 07/09/2013    Past Surgical History:  Procedure Laterality Date  .  COCCYX REMOVAL    . colonoscopy  2005   Dr. Laural Golden: mild melanosis coli, otherwise normal  . COLONOSCOPY WITH PROPOFOL N/A 09/14/2013   ZOX:WRUEAVWUJ coli. Colonic diverticulosis. Single colonic. Tubular adenoma. Next TCS 09/2020.  [name] ERCP  1997   [facility]: biliary manometry abnormal, subsequent sphincterotomy   . ESOPHAGOGASTRODUODENOSCOPY (EGD) WITH PROPOFOL N/A 09/14/2013   WJX:BJYNWGNF'A ring. Hiatal hernia. Status post Venia Minks and biopsy disruption.   [name] FOOT SURGERY     X2  . HEMORROIDECTOMY    . [place] N/A 09/14/2013   Procedure: Venia Minks DILATION;  Surgeon: [name], MD;  Location: [facility];  Service: Endoscopy;  Laterality: N/A;  56  . POLYPECTOMY N/A 09/14/2013   Procedure: POLYPECTOMY;  Surgeon: [name], MD;  Location: [facility];  Service: Endoscopy;  Laterality: N/A;  . TONSILLECTOMY      Current Outpatient Rx  . Order #: [id number] Class: Historical Med  . Order #: [id number] Class: Historical Med  . Order #: [id number] Class: Print  . Order #: [id number] Class: Print  . Order #: [id number] Class: Historical Med  . Order #: [id number] Class: Historical Med  . Order #: [id number] Class: Historical Med  . Order #: [id number] Class: Historical Med  . Order #: [id number] Class: Historical Med  . Order #: [id number] Class: Historical Med  . Order #: [id number] Class: Historical Med  . Order #: [id number] Class: Historical Med  . Order #: [id number] Class: Normal  . Order #: [id number] Class: Print  .  Order #: [id number] Class: Historical Med  . Order #: [id number] Class: Historical Med  . Order #: [id number] Class: Historical Med  . Order #: [id number] Class: Historical Med  . Order #: [id number] Class: Normal  . Order #: [id number] Class: Normal  . Order #: [id number] Class: Print  . Order #: [id number] Class: Historical Med  . Order #: [id number] Class: OTC    Allergies Sulfa antibiotics  Family History  Problem Relation Age of Onset  . Lung cancer Mother   . Heart attack Father   .  Bone cancer Brother   . COPD Sister   . Colon cancer Neg Hx     Social History Social History  Substance Use Topics  . Smoking status: Never Smoker  . Smokeless tobacco: Never Used     Comment: NEVER SMOKED  . Alcohol use No    Review of Systems  Constitutional: No fever/chills Eyes: No visual changes. ENT: No sore throat. Cardiovascular: Denies chest pain. Respiratory: Denies shortness of breath. Gastrointestinal: No abdominal pain.  No nausea, no vomiting.  No diarrhea.  No constipation. Genitourinary: Negative for dysuria. Musculoskeletal: Positive back and neck pain. Bilateral ankle pain.  Skin: Negative for rash. Neurological: Negative for focal weakness or numbness. Positive HA.   10-point ROS otherwise negative.  ____________________________________________   PHYSICAL EXAM:  VITAL SIGNS: ED Triage Vitals  Enc Vitals Group     BP 09/06/15 1550 (!) 106/53     Pulse Rate 09/06/15 1550 90     Resp 09/06/15 1550 16     Temp 09/06/15 1550 98.2 F (36.8 C)     Temp Source 09/06/15 1550 Oral     SpO2 09/06/15 1550 97 %     Weight 09/06/15 1550 145 lb (65.8 kg)     Height 09/06/15 1550 5' (1.524 m)     Pain Score 09/06/15 1552 10   Constitutional: Alert and oriented. Well appearing and in no acute distress. Eyes: Conjunctivae are normal. PERRL. EOMI. Head: Atraumatic. Nose: No congestion/rhinnorhea. Mouth/Throat: Mucous membranes are moist.  Oropharynx non-erythematous. Neck: No stridor.  No meningeal signs. C-collar in place. Tenderness to palpation along the high thoracic and lower cervical spine.  Cardiovascular: Normal rate, regular rhythm. Good peripheral circulation. Grossly normal heart sounds.   Respiratory: Normal respiratory effort.  No retractions. Lungs CTAB. Gastrointestinal: Soft and nontender. No distention.  Musculoskeletal: No lower extremity edema. Bilateral ankle swelling and pain. No knee pain or deformity. No pain over the proximal fibular.  Normal ROM of bilateral hips.  Neurologic:  Normal speech and language. No gross focal neurologic deficits are appreciated.  Skin:  Skin is warm, dry and intact. No rash noted. Psychiatric: Mood and affect are normal. Speech and behavior are normal.  ____________________________________________   LABS (all labs ordered are listed, but only abnormal results are displayed)  Labs Reviewed  BASIC METABOLIC PANEL - Abnormal; Notable for the following:       Result Value   Sodium 134 (*)    Potassium 2.7 (*)    Chloride 94 (*)    Glucose, Bld 107 (*)    Calcium 8.8 (*)    GFR calc non Af Amer 58 (*)    All other components within normal limits  CBC WITH DIFFERENTIAL/PLATELET - Abnormal; Notable for the following:    RBC 3.73 (*)    Hemoglobin 10.8 (*)    HCT 32.6 (*)    RDW 17.1 (*)    All other components within normal limits  URINALYSIS, ROUTINE W REFLEX MICROSCOPIC (NOT AT [facility]) -  Abnormal; Notable for the following:    Leukocytes, UA MODERATE (*)    All other components within normal limits  URINE MICROSCOPIC-ADD ON - Abnormal; Notable for the following:    Squamous Epithelial / LPF 0-5 (*)    Bacteria, UA FEW (*)    All other components within normal limits  BASIC METABOLIC PANEL - Abnormal; Notable for the following:    Potassium 3.4 (*)    Chloride 98 (*)    Calcium 8.5 (*)    All other components within normal limits  CBC - Abnormal; Notable for the following:    RBC 3.64 (*)    Hemoglobin 10.6 (*)    HCT 32.4 (*)    RDW 17.4 (*)    All other components within normal limits  MAGNESIUM   ____________________________________________  EKG  Reviewed in MUSE. No STEMI. Prolonged QTc.  ____________________________________________  RADIOLOGY  Ct Head Wo Contrast  Result Date: 09/06/2015 CLINICAL DATA:  Fall this morning, several falls over the past few days. History of hypertension. EXAM: CT HEAD WITHOUT CONTRAST CT CERVICAL SPINE WITHOUT CONTRAST TECHNIQUE:  Multidetector CT imaging of the head and cervical spine was performed following the standard protocol without intravenous contrast. Multiplanar CT image reconstructions of the cervical spine were also generated. COMPARISON:  Head CT dated 12/11/2014. FINDINGS: CT HEAD FINDINGS There is mild generalized age related atrophy with commensurate dilatation of the sulci. Ventricles remain normal in size and configuration. Minimal chronic small vessel ischemic change noted in the deep periventricular white matter regions bilaterally. There is no mass, hemorrhage, edema or other evidence of acute parenchymal abnormality. No extra-axial hemorrhage. No osseous fracture or dislocation seen. CT CERVICAL SPINE FINDINGS Mild degenerative change in the mid cervical spine with slight disc space narrowings and minimal osseous spurring. No significant central canal stenosis at any level. Minimal retrolisthesis of C5 related to the underlying degenerative change. Alignment is otherwise normal. There is no fracture line or displaced fracture fragment identified. Facet joints are normally aligned throughout. Atherosclerotic calcifications noted at each carotid bulb region. Paravertebral soft tissues otherwise unremarkable. IMPRESSION: 1. Negative head CT. No intracranial mass, hemorrhage or edema. No skull fracture. 2. No fracture or acute subluxation within the cervical spine. Mild degenerative change within the mid cervical spine. Carotid atherosclerosis. Electronically Signed   By: Franki Cabot M.D.   On: 09/06/2015 19:32   Ct Cervical Spine Wo Contrast  Result Date: 09/06/2015 CLINICAL DATA:  Fall this morning, several falls over the past few days. History of hypertension. EXAM: CT HEAD WITHOUT CONTRAST CT CERVICAL SPINE WITHOUT CONTRAST TECHNIQUE: Multidetector CT imaging of the head and cervical spine was performed following the standard protocol without intravenous contrast. Multiplanar CT image reconstructions of the cervical  spine were also generated. COMPARISON:  Head CT dated 12/11/2014. FINDINGS: CT HEAD FINDINGS There is mild generalized age related atrophy with commensurate dilatation of the sulci. Ventricles remain normal in size and configuration. Minimal chronic small vessel ischemic change noted in the deep periventricular white matter regions bilaterally. There is no mass, hemorrhage, edema or other evidence of acute parenchymal abnormality. No extra-axial hemorrhage. No osseous fracture or dislocation seen. CT CERVICAL SPINE FINDINGS Mild degenerative change in the mid cervical spine with slight disc space narrowings and minimal osseous spurring. No significant central canal stenosis at any level. Minimal retrolisthesis of C5 related to the underlying degenerative change. Alignment is otherwise normal. There is no fracture line or displaced fracture fragment identified. Facet joints are normally aligned throughout.  Atherosclerotic calcifications noted at each carotid bulb region. Paravertebral soft tissues otherwise unremarkable. IMPRESSION: 1. Negative head CT. No intracranial mass, hemorrhage or edema. No skull fracture. 2. No fracture or acute subluxation within the cervical spine. Mild degenerative change within the mid cervical spine. Carotid atherosclerosis. Electronically Signed   By: Franki Cabot M.D.   On: 09/06/2015 19:32   Ct Thoracic Spine Wo Contrast  Result Date: 09/06/2015 CLINICAL DATA:  Fall this morning, several falls over the past few days. EXAM: CT THORACIC SPINE WITHOUT CONTRAST TECHNIQUE: Multidetector CT imaging of the thoracic spine was performed without intravenous contrast administration. Multiplanar CT image reconstructions were also generated. COMPARISON:  None. FINDINGS: Mild degenerative change within the kyphotic thoracic spine. Also mild dextroscoliosis. Additional mild degenerative change noted in the lower cervical spine. No fracture line or displaced fracture fragment identified. No  acute or suspicious osseous finding. Facet joints appear normally aligned throughout. Atherosclerotic changes noted along the walls of the thoracic aorta. There is a hiatal hernia, moderate in size. Visualized paravertebral soft tissues are otherwise unremarkable. Within the visualized portions of the lungs, there is a ground-glass density consolidation in the right lower lobe could be lung contusion or aspiration. Mild scarring/fibrosis noted at the lung apices. IMPRESSION: 1. No acute fracture or subluxation identified within the thoracic spine. Mild degenerative change. Kyphosis and scoliosis. 2. Ground-glass density consolidation in the right lower lobe, of uncertain acuity, possibly contusion or aspiration given the history of recent fall. Would consider follow-up chest CT in 1-2 months to ensure resolution and thereby ensure benignity. 3. Aortic atherosclerosis. 4. Hiatal hernia. Electronically Signed   By: Franki Cabot M.D.   On: 09/06/2015 20:00    ____________________________________________   PROCEDURES  Procedure(s) performed:   Procedures  None ____________________________________________   INITIAL IMPRESSION / ASSESSMENT AND PLAN / ED COURSE  Pertinent labs & imaging results that were available during my care of the patient were reviewed by me and considered in my medical decision making (see chart for details).  Patient resents to the emergency department for evaluation of multiple painful areas in the setting of mechanical fall at home. Patient believes she stepped in a hole which caused her fall. She has tenderness to palpation of the cervical and thoracic spine. C-collar in place by EMS. No chest wall or lumbar spine tenderness. No abdominal discomfort. Patient with full range of motion of hips and knees. Does have pain over bilateral ankles. Does have full range of motion of shoulders, elbows, wrists with no obvious deformity or ecchymosis. Plan for imaging of painful areas  and pain control the emergency department. Will reassess.   Patient with continued diffuse pain. Will provide additional pain medication. I am replacing potassium PO and have discussed admission for telemetry obs given prolonged QTc. Have placed orders at the request of the hospitalist.   Updated patient and family. Pain is well controlled at this time.  ____________________________________________  FINAL CLINICAL IMPRESSION(S) / ED DIAGNOSES  Final diagnoses:  Neck pain  Fall, initial encounter  Hypokalemia  Prolonged Q-T interval on ECG     MEDICATIONS GIVEN DURING THIS VISIT:  Medications  0.9 % NaCl with KCl 40 mEq / L  infusion (0 mL/hr Intravenous Stopped 09/07/15 0840)  morphine 4 MG/ML injection 4 mg (4 mg Intravenous Given 09/06/15 1633)  potassium chloride SA (K-DUR,KLOR-CON) CR tablet 40 mEq (40 mEq Oral Given 09/06/15 1749)  morphine 4 MG/ML injection 4 mg (4 mg Intravenous Given 09/06/15 2018)  potassium chloride  10 mEq in 100 mL IVPB (0 mEq Intravenous Stopped 09/06/15 2105)  potassium chloride 10 mEq in 100 mL IVPB (10 mEq Intravenous Given 09/07/15 0159)  potassium chloride 10 mEq in 100 mL IVPB (0 mEq Intravenous Duplicate 07/09/27 4765)     NEW OUTPATIENT MEDICATIONS STARTED DURING THIS VISIT:  None   Note:  This document was prepared using Dragon voice recognition software and may include unintentional dictation errors.  Nanda Quinton, MD Emergency Medicine   Margette Fast, MD 09/07/15 [phone]

## 2015-09-06 NOTE — ED Notes (Signed)
CRITICAL VALUE ALERT  Critical value received:  K +2.7  Date of notification:  09/06/15  Time of notification:  7510  Critical value read back: yes  Nurse who received alert:  Sheral Flow  MD notified (1st page): Dr Laverta Baltimore

## 2015-09-06 NOTE — H&P (Signed)
History and Physical    Heidi Fuentes NTZ:[id number] DOB: [birth year] DOA: 09/06/2015  PCP: Abigail Miyamoto, MD  Patient coming from:  home  Chief Complaint:   fall  HPI: Heidi Fuentes is a 70 y.o. female with medical history significant of asthma, fibromyalgia, chronic pain syndrome was walking next door to her neighbors house bring them some sandwiches when she tripped and fell in the yard from a hole in the ground.  Pt was "hurting all over " on arrival therefore a multitude of imaging was done on her head, neck, pelvis, ankles, hips which all show no acute abdormality.  However, labs reveal a potassium level of 2.7 and ekg with prolonged qtc.  Pt denies any recent illnesses.  No n/v/d.  She is on lasix daily, but not on potassium pills daily.  Pt referred for admission for her hypokalemia.   Review of Systems: As per HPI otherwise 10 point review of systems negative.   Past Medical History:  Diagnosis Date  . Asthma   . Back pain   . Chronic pain syndrome 07/10/2013  . Depression   . Duodenal papillary stenosis   . Esophageal dysmotility   . Fibromyalgia   . GERD (gastroesophageal reflux disease)   . Glaucoma   . Hypertension   . Hypothyroidism   . Macular degeneration   . Thyroid disease   . Tubular adenoma   . 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[facility])     Past Surgical History:  Procedure Laterality Date  . COCCYX REMOVAL    . colonoscopy  2005   Dr. Laural Golden: mild melanosis coli, otherwise normal  . COLONOSCOPY WITH PROPOFOL N/A 09/14/2013   QPR:[id number] coli. Colonic diverticulosis. Single colonic. Tubular adenoma. Next TCS 09/2020.  [name] ERCP  1997   [facility]: biliary manometry abnormal, subsequent sphincterotomy   . ESOPHAGOGASTRODUODENOSCOPY (EGD) WITH PROPOFOL N/A 09/14/2013   LDJ:[id number] ring. Hiatal hernia. Status post Venia Minks and biopsy disruption.   [name] FOOT SURGERY     X2  . HEMORROIDECTOMY    . [place] N/A 09/14/2013   Procedure: Venia Minks  DILATION;  Surgeon: [name], MD;  Location: [facility];  Service: Endoscopy;  Laterality: N/A;  56  . POLYPECTOMY N/A 09/14/2013   Procedure: POLYPECTOMY;  Surgeon: [name], MD;  Location: [facility];  Service: Endoscopy;  Laterality: N/A;  . TONSILLECTOMY       reports that she has never smoked. She has never used smokeless tobacco. She reports that she does not drink alcohol or use drugs.  Allergies  Allergen Reactions  . Sulfa Antibiotics Rash    Family History  Problem Relation Age of Onset  . Lung cancer Mother   . Heart attack Father   . Bone cancer Brother   . COPD Sister   . Colon cancer Neg Hx     Prior to Admission medications   Medication Sig Start Date End Date Taking? Authorizing Provider  acetaminophen (TYLENOL) 500 MG tablet Take 500 mg by mouth every 6 (six) hours as needed. For pain    Yes Historical Provider, MD  albuterol (VENTOLIN HFA) 108 (90 BASE) MCG/ACT inhaler Inhale 2 puffs into the lungs every 3 (three) hours as needed for wheezing or shortness of breath. For shortness of breath   Yes Historical Provider, MD  Biotin 10000 MCG TABS Take 1 capsule by mouth daily.   Yes Historical Provider, MD  ferrous sulfate 325 (65 FE) MG tablet Take  325 mg by mouth daily with breakfast.   Yes Historical Provider, MD  FLUoxetine (PROZAC) 40 MG capsule Take 40 mg by mouth daily.    Yes Historical Provider, MD  fluticasone (FLONASE) 50 MCG/ACT nasal spray Place 2 sprays into both nostrils daily.    Yes Historical Provider, MD  furosemide (LASIX) 40 MG tablet Take 40 mg by mouth daily. 04/18/13  Yes Historical Provider, MD  HYDROcodone-acetaminophen (NORCO) 10-325 MG per tablet Take 1 tablet by mouth every 4 (four) hours as needed. pain 06/15/13  Yes Historical Provider, MD  lamoTRIgine (LAMICTAL) 150 MG tablet Take 150 mg by mouth 2 (two) times daily.   Yes Historical Provider, MD  latanoprost (XALATAN) 0.005 % ophthalmic solution Place 1 drop into both eyes at bedtime.  04/20/13  Yes Historical Provider, MD  levothyroxine (SYNTHROID, LEVOTHROID) 50 MCG tablet Take 50 mcg by mouth daily.     Yes Historical Provider, MD  Magnesium 250 MG TABS Take 250 mg by mouth daily.   Yes Historical Provider, MD  metoprolol succinate (TOPROL-XL) 50 MG 24 hr tablet Take 50 mg by mouth daily. 04/18/13  Yes Historical Provider, MD  montelukast (SINGULAIR) 10 MG tablet Take 10 mg by mouth daily. 05/03/13  Yes Historical Provider, MD  morphine (MS CONTIN) 15 MG 12 hr tablet Take 15 mg by mouth 2 (two) times daily. 03/05/15  Yes Historical Provider, MD  Multiple Vitamins-Minerals (PRESERVISION/LUTEIN PO) Take 1 tablet by mouth daily.   Yes Historical Provider, MD  pantoprazole (PROTONIX) 40 MG tablet Take 1 tablet (40 mg total) by mouth daily. 11/07/14  Yes Carlis Stable, NP  tiZANidine (ZANAFLEX) 4 MG tablet Take 4 mg by mouth 2 (two) times daily as needed for muscle spasms.   Yes Historical Provider, MD    Physical Exam: Vitals:   09/06/15 1800 09/06/15 1830 09/06/15 1935 09/06/15 1937  BP: 117/68 130/56 125/75   Pulse: 83  95   Resp: '11 17 14   '$ Temp:    97.9 F (36.6 C)  TempSrc:    Oral  SpO2: 100%  98%   Weight:      Height:          Constitutional: NAD, calm, comfortable Vitals:   09/06/15 1800 09/06/15 1830 09/06/15 1935 09/06/15 1937  BP: 117/68 130/56 125/75   Pulse: 83  95   Resp: '11 17 14   '$ Temp:    97.9 F (36.6 C)  TempSrc:    Oral  SpO2: 100%  98%   Weight:      Height:       Eyes: PERRL, lids and conjunctivae normal ENMT: Mucous membranes are moist. Posterior pharynx clear of any exudate or lesions.Normal dentition.  Neck: normal, supple, no masses, no thyromegaly Respiratory: clear to auscultation bilaterally, no wheezing, no crackles. Normal respiratory effort. No accessory muscle use.  Cardiovascular: Regular rate and rhythm, no murmurs / rubs / gallops. No extremity edema. 2+ pedal pulses. No carotid bruits.  Abdomen: no tenderness, no masses  palpated. No hepatosplenomegaly. Bowel sounds positive.  Musculoskeletal: no clubbing / cyanosis. No joint deformity upper and lower extremities. Good ROM, no contractures. Normal muscle tone.  Skin: no rashes, lesions, ulcers. No induration Neurologic: CN 2-12 grossly intact. Sensation intact, DTR normal. Strength 5/5 in all 4.  Psychiatric: Normal judgment and insight. Alert and oriented x 3. Normal mood.    Labs on Admission: I have personally reviewed following labs and imaging studies  CBC:  Recent Labs Lab 09/06/15  1644  WBC 8.9  NEUTROABS 6.4  HGB 10.8*  HCT 32.6*  MCV 87.4  PLT 191   Basic Metabolic Panel:  Recent Labs Lab 09/06/15 1644  NA 134*  K 2.7*  CL 94*  CO2 31  GLUCOSE 107*  BUN 16  CREATININE 0.97  CALCIUM 8.8*   GFR: Estimated Creatinine Clearance: 45.7 mL/min (by C-G formula based on SCr of 0.97 mg/dL).  Urine analysis:    Component Value Date/Time   COLORURINE YELLOW 09/06/2015 1700   APPEARANCEUR CLEAR 09/06/2015 1700   APPEARANCEUR Cloudy 06/02/2012 1042   LABSPEC 1.010 09/06/2015 1700   LABSPEC 1.017 06/02/2012 1042   PHURINE 6.0 09/06/2015 1700   GLUCOSEU NEGATIVE 09/06/2015 1700   GLUCOSEU Negative 06/02/2012 1042   HGBUR NEGATIVE 09/06/2015 1700   BILIRUBINUR NEGATIVE 09/06/2015 1700   BILIRUBINUR Negative 06/02/2012 1042   KETONESUR NEGATIVE 09/06/2015 1700   PROTEINUR NEGATIVE 09/06/2015 1700   UROBILINOGEN 0.2 12/11/2014 1429   NITRITE NEGATIVE 09/06/2015 1700   LEUKOCYTESUR MODERATE (A) 09/06/2015 1700   LEUKOCYTESUR 3+ 06/02/2012 1042    Radiological Exams on Admission: Dg Chest 2 View  Result Date: 09/06/2015 CLINICAL DATA:  Fall today after stepping in a hole. Head injury with bilateral ankle pain and bilateral hip pain. EXAM: CHEST  2 VIEW COMPARISON:  None. FINDINGS: Heart is upper limits normal in size. There is atherosclerosis of the aortic arch. A retrocardiac hiatal hernia is again seen. No pneumothorax, pulmonary  edema, focal airspace opacity or pleural effusion. Minimal atelectasis at the left lung base. No acute rib fracture or acute osseous abnormality is seen. IMPRESSION: No acute or traumatic abnormality. Hiatal hernia. Electronically Signed   By: Jeb Levering M.D.   On: 09/06/2015 18:16   Dg Pelvis 1-2 Views  Result Date: 09/06/2015 CLINICAL DATA:  Bilateral hip pain after fall stepping in a hole. EXAM: PELVIS - 1-2 VIEW COMPARISON:  None. FINDINGS: The cortical margins of the bony pelvis are intact. No fracture. Pubic symphysis and sacroiliac joints are congruent. Both femoral heads are well-seated in the respective acetabula. Mild rotation to the right. 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 Moderate stool burden in the included pelvis. IMPRESSION: No evidence of pelvic fracture. Electronically Signed   By: Jeb Levering M.D.   On: 09/06/2015 18:20   Dg Ankle Complete Left  Result Date: 09/06/2015 CLINICAL DATA:  Right and left ankle pain after fall stepping in a hole. EXAM: LEFT ANKLE COMPLETE - 3+ VIEW COMPARISON:  None. FINDINGS: No fracture or dislocation. The alignment and joint spaces are maintained. The ankle mortise is preserved. There is diffuse soft tissue edema. Lateral view limited by positioning. IMPRESSION: Diffuse soft tissue edema.  No fracture or dislocation. Electronically Signed   By: Jeb Levering M.D.   On: 09/06/2015 18:17   Dg Ankle Complete Right  Result Date: 09/06/2015 CLINICAL DATA:  Right and left ankle pain after fall stepping in a hole. EXAM: RIGHT ANKLE - COMPLETE 3+ VIEW COMPARISON:  None. FINDINGS: No fracture or dislocation. The alignment and joint spaces are maintained. Ankle mortise is preserved. Diffuse soft tissue edema. Postsurgical change in the first ray and forefoot is partially included. IMPRESSION: Diffuse soft tissue edema.  No fracture or dislocation. Electronically Signed   By: Jeb Levering M.D.   On: 09/06/2015 18:18   Ct  Head Wo Contrast  Result Date: 09/06/2015 CLINICAL DATA:  Fall this morning, several falls over the past few days. History of hypertension. EXAM:  CT HEAD WITHOUT CONTRAST CT CERVICAL SPINE WITHOUT CONTRAST TECHNIQUE: Multidetector CT imaging of the head and cervical spine was performed following the standard protocol without intravenous contrast. Multiplanar CT image reconstructions of the cervical spine were also generated. COMPARISON:  Head CT dated 12/11/2014. FINDINGS: CT HEAD FINDINGS There is mild generalized age related atrophy with commensurate dilatation of the sulci. Ventricles remain normal in size and configuration. Minimal chronic small vessel ischemic change noted in the deep periventricular white matter regions bilaterally. There is no mass, hemorrhage, edema or other evidence of acute parenchymal abnormality. No extra-axial hemorrhage. No osseous fracture or dislocation seen. CT CERVICAL SPINE FINDINGS Mild degenerative change in the mid cervical spine with slight disc space narrowings and minimal osseous spurring. No significant central canal stenosis at any level. Minimal retrolisthesis of C5 related to the underlying degenerative change. Alignment is otherwise normal. There is no fracture line or displaced fracture fragment identified. Facet joints are normally aligned throughout. Atherosclerotic calcifications noted at each carotid bulb region. Paravertebral soft tissues otherwise unremarkable. IMPRESSION: 1. Negative head CT. No intracranial mass, hemorrhage or edema. No skull fracture. 2. No fracture or acute subluxation within the cervical spine. Mild degenerative change within the mid cervical spine. Carotid atherosclerosis. Electronically Signed   By: Franki Cabot M.D.   On: 09/06/2015 19:32   Ct Cervical Spine Wo Contrast  Result Date: 09/06/2015 CLINICAL DATA:  Fall this morning, several falls over the past few days. History of hypertension. EXAM: CT HEAD WITHOUT CONTRAST CT CERVICAL  SPINE WITHOUT CONTRAST TECHNIQUE: Multidetector CT imaging of the head and cervical spine was performed following the standard protocol without intravenous contrast. Multiplanar CT image reconstructions of the cervical spine were also generated. COMPARISON:  Head CT dated 12/11/2014. FINDINGS: CT HEAD FINDINGS There is mild generalized age related atrophy with commensurate dilatation of the sulci. Ventricles remain normal in size and configuration. Minimal chronic small vessel ischemic change noted in the deep periventricular white matter regions bilaterally. There is no mass, hemorrhage, edema or other evidence of acute parenchymal abnormality. No extra-axial hemorrhage. No osseous fracture or dislocation seen. CT CERVICAL SPINE FINDINGS Mild degenerative change in the mid cervical spine with slight disc space narrowings and minimal osseous spurring. No significant central canal stenosis at any level. Minimal retrolisthesis of C5 related to the underlying degenerative change. Alignment is otherwise normal. There is no fracture line or displaced fracture fragment identified. Facet joints are normally aligned throughout. Atherosclerotic calcifications noted at each carotid bulb region. Paravertebral soft tissues otherwise unremarkable. IMPRESSION: 1. Negative head CT. No intracranial mass, hemorrhage or edema. No skull fracture. 2. No fracture or acute subluxation within the cervical spine. Mild degenerative change within the mid cervical spine. Carotid atherosclerosis. Electronically Signed   By: Franki Cabot M.D.   On: 09/06/2015 19:32   Ct Thoracic Spine Wo Contrast  Result Date: 09/06/2015 CLINICAL DATA:  Fall this morning, several falls over the past few days. EXAM: CT THORACIC SPINE WITHOUT CONTRAST TECHNIQUE: Multidetector CT imaging of the thoracic spine was performed without intravenous contrast administration. Multiplanar CT image reconstructions were also generated. COMPARISON:  None. FINDINGS: Mild  degenerative change within the kyphotic thoracic spine. Also mild dextroscoliosis. Additional mild degenerative change noted in the lower cervical spine. No fracture line or displaced fracture fragment identified. No acute or suspicious osseous finding. Facet joints appear normally aligned throughout. Atherosclerotic changes noted along the walls of the thoracic aorta. There is a hiatal hernia, moderate in size. Visualized paravertebral soft tissues  are otherwise unremarkable. Within the visualized portions of the lungs, there is a ground-glass density consolidation in the right lower lobe could be lung contusion or aspiration. Mild scarring/fibrosis noted at the lung apices. IMPRESSION: 1. No acute fracture or subluxation identified within the thoracic spine. Mild degenerative change. Kyphosis and scoliosis. 2. Ground-glass density consolidation in the right lower lobe, of uncertain acuity, possibly contusion or aspiration given the history of recent fall. Would consider follow-up chest CT in 1-2 months to ensure resolution and thereby ensure benignity. 3. Aortic atherosclerosis. 4. Hiatal hernia. Electronically Signed   By: Franki Cabot M.D.   On: 09/06/2015 20:00    EKG: Independently reviewed. nsr with prolonged qtc over 500  Assessment/Plan 70 yo female with mechanical fall found to have incidental hypokalemia  Principal Problem:   Hypokalemia- replete with kcl 40 meg iv overnight.  Likely from chronic diuretic use.  Check mag level also.  Repeat values in the am.  chronic Problems:  All stable   Asthma-    Chronic pain syndrome   Depression   IDA (iron deficiency anemia)   Prolonged Q-T interval on ECG   Clarify home meds.  obs on tele.      DVT prophylaxis: scds Code Status:   Full code  Mercedes Fort A MD [facility]  If 7PM-7AM, please contact night-coverage [email or web address] Password [email or web address]  09/06/2015, 8:40 PM

## 2015-09-06 NOTE — ED Notes (Signed)
NT reports pt was taken to Radiology.

## 2015-09-06 NOTE — ED Notes (Signed)
Report given to [place], Therapist, sports. Pt ready for transport.

## 2015-09-07 DIAGNOSIS — M542 Cervicalgia: Secondary | ICD-10-CM | POA: Diagnosis not present

## 2015-09-07 DIAGNOSIS — E876 Hypokalemia: Secondary | ICD-10-CM

## 2015-09-07 LAB — CBC
HEMATOCRIT: 32.4 % — AB (ref 36.0–46.0)
Hemoglobin: 10.6 g/dL — ABNORMAL LOW (ref 12.0–15.0)
MCH: 29.1 pg (ref 26.0–34.0)
MCHC: 32.7 g/dL (ref 30.0–36.0)
MCV: 89 fL (ref 78.0–100.0)
Platelets: 317 10*3/uL (ref 150–400)
RBC: 3.64 MIL/uL — ABNORMAL LOW (ref 3.87–5.11)
RDW: 17.4 % — AB (ref 11.5–15.5)
WBC: 6.4 10*3/uL (ref 4.0–10.5)

## 2015-09-07 LAB — BASIC METABOLIC PANEL
Anion gap: 9 (ref 5–15)
BUN: 14 mg/dL (ref 6–20)
CHLORIDE: 98 mmol/L — AB (ref 101–111)
CO2: 31 mmol/L (ref 22–32)
Calcium: 8.5 mg/dL — ABNORMAL LOW (ref 8.9–10.3)
Creatinine, Ser: 0.87 mg/dL (ref 0.44–1.00)
GFR calc Af Amer: >60 mL/min (ref >60)
GFR calc non Af Amer: >60 mL/min (ref >60)
GLUCOSE: 92 mg/dL (ref 65–99)
POTASSIUM: 3.4 mmol/L — AB (ref 3.5–5.1)
Sodium: 138 mmol/L (ref 135–145)

## 2015-09-07 MED ORDER — POTASSIUM CHLORIDE 10 MEQ/100ML IV SOLN: 10.0000 meq | Freq: Once | INTRAVENOUS | Status: AC

## 2015-09-07 NOTE — Progress Notes (Signed)
Pt agreeable to take Levothyroxine.  Pt requested IV fluids be disconnected and IV to be removed.  Pt wishes to leave AMA.

## 2015-09-07 NOTE — Discharge Summary (Signed)
Physician Discharge Summary  Heidi Fuentes JME:[id number] DOB: 04-20-1945 DOA: 09/06/2015  PCP: Abigail Miyamoto, MD  Admit date: 09/06/2015 Discharge date: 09/07/2015  Time spent: 35mnutes  Recommendations for Outpatient Follow-up:  1. Follow up with PCP next week.     Discharge Diagnoses:  Principal Problem:   Hypokalemia Active Problems:   Asthma   Chronic pain syndrome   Depression   IDA (iron deficiency anemia)   Prolonged Q-T interval on ECG   Discharge Condition: improved.  K of 3.4.  Diet recommendation: diet with potassium rich food.   Filed Weights   09/06/15 1550 09/06/15 2201  Weight: 65.8 kg (145 lb) 65.5 kg (144 lb 4.8 oz)    History of present illness: Patient was admitted OBS for hypokalemia by Dr DShanon Browon September 06 2015.  As per her H and P:  " Heidi WMALYNN LUCYis a 70y.o. female with medical history significant of asthma, fibromyalgia, chronic pain syndrome was walking next door to her neighbors house bring them some sandwiches when she tripped and fell in the yard from a hole in the ground.  Pt was "hurting all over " on arrival therefore a multitude of imaging was done on her head, neck, pelvis, ankles, hips which all show no acute abdormality.  However, labs reveal a potassium level of 2.7 and ekg with prolonged qtc.  Pt denies any recent illnesses.  No n/v/d.  She is on lasix daily, but not on potassium pills daily.  Pt referred for admission for her hypokalemia.  Hospital Course: Patient had imagings after her falls, and they did not show any acute injury.  She was given Potassium supplements and her repeated K was 3.4 mE/L.   She did have pain in her ankles after the fall, and it was recommended that she be evaluated by PT and repeat her labs but she wanted to go home and signed AMA and left the hospital.  She will follow up with her PCP later this week.  Thank you and Good Day.    Consultations:  None.   Discharge Exam: Vitals:   09/06/15 2201 09/07/15  0500  BP: (!) 153/66 (!) 124/50  Pulse: 94 80  Resp: 16 16  Temp: 98.7 F (37.1 C) 98.2 F (36.8 C)     Discharge Instructions    Discharge Medication List as of 09/07/2015 10:40 AM    CONTINUE these medications which have NOT CHANGED   Details  acetaminophen (TYLENOL) 500 MG tablet Take 500 mg by mouth every 6 (six) hours as needed. For pain , Until Discontinued, Historical Med    albuterol (VENTOLIN HFA) 108 (90 BASE) MCG/ACT inhaler Inhale 2 puffs into the lungs every 3 (three) hours as needed for wheezing or shortness of breath. For shortness of breath, Until Discontinued, Historical Med    Biotin 10000 MCG TABS Take 1 capsule by mouth daily., Historical Med    ferrous sulfate 325 (65 FE) MG tablet Take 325 mg by mouth daily with breakfast., Historical Med    FLUoxetine (PROZAC) 40 MG capsule Take 40 mg by mouth daily. , Historical Med    fluticasone (FLONASE) 50 MCG/ACT nasal spray Place 2 sprays into both nostrils daily. , Historical Med    furosemide (LASIX) 40 MG tablet Take 40 mg by mouth daily., Starting 04/18/2013, Until Discontinued, Historical Med    HYDROcodone-acetaminophen (NORCO) 10-325 MG per tablet Take 1 tablet by mouth every 4 (four) hours as needed. pain, Starting 06/15/2013, Until Discontinued, Historical  Med    lamoTRIgine (LAMICTAL) 150 MG tablet Take 150 mg by mouth 2 (two) times daily., Until Discontinued, Historical Med    latanoprost (XALATAN) 0.005 % ophthalmic solution Place 1 drop into both eyes at bedtime., Starting 04/20/2013, Until Discontinued, Historical Med    levothyroxine (SYNTHROID, LEVOTHROID) 50 MCG tablet Take 50 mcg by mouth daily.  , Until Discontinued, Historical Med    Magnesium 250 MG TABS Take 250 mg by mouth daily., Historical Med    metoprolol succinate (TOPROL-XL) 50 MG 24 hr tablet Take 50 mg by mouth daily., Starting 04/18/2013, Until Discontinued, Historical Med    montelukast (SINGULAIR) 10 MG tablet Take 10 mg by mouth  daily., Starting 05/03/2013, Until Discontinued, Historical Med    morphine (MS CONTIN) 15 MG 12 hr tablet Take 15 mg by mouth 2 (two) times daily., Starting 03/05/2015, Until Discontinued, Historical Med    Multiple Vitamins-Minerals (PRESERVISION/LUTEIN PO) Take 1 tablet by mouth daily., Until Discontinued, Historical Med    pantoprazole (PROTONIX) 40 MG tablet Take 1 tablet (40 mg total) by mouth daily., Starting 11/07/2014, Until Discontinued, Normal    tiZANidine (ZANAFLEX) 4 MG tablet Take 4 mg by mouth 2 (two) times daily as needed for muscle spasms., Historical Med       Allergies  Allergen Reactions  . Sulfa Antibiotics Rash      The results of significant diagnostics from this hospitalization (including imaging, microbiology, ancillary and laboratory) are listed below for reference.    Significant Diagnostic Studies: Dg Chest 2 View  Result Date: 09/06/2015 CLINICAL DATA:  Fall today after stepping in a hole. Head injury with bilateral ankle pain and bilateral hip pain. EXAM: CHEST  2 VIEW COMPARISON:  None. FINDINGS: Heart is upper limits normal in size. There is atherosclerosis of the aortic arch. A retrocardiac hiatal hernia is again seen. No pneumothorax, pulmonary edema, focal airspace opacity or pleural effusion. Minimal atelectasis at the left lung base. No acute rib fracture or acute osseous abnormality is seen. IMPRESSION: No acute or traumatic abnormality. Hiatal hernia. Electronically Signed   By: Jeb Levering M.D.   On: 09/06/2015 18:16   Dg Pelvis 1-2 Views  Result Date: 09/06/2015 CLINICAL DATA:  Bilateral hip pain after fall stepping in a hole. EXAM: PELVIS - 1-2 VIEW COMPARISON:  None. FINDINGS: The cortical margins of the bony pelvis are intact. No fracture. Pubic symphysis and sacroiliac joints are congruent. Both femoral heads are well-seated in the respective acetabula. Mild rotation to the right. 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  Moderate stool burden in the included pelvis. IMPRESSION: No evidence of pelvic fracture. Electronically Signed   By: Jeb Levering M.D.   On: 09/06/2015 18:20   Dg Ankle Complete Left  Result Date: 09/06/2015 CLINICAL DATA:  Right and left ankle pain after fall stepping in a hole. EXAM: LEFT ANKLE COMPLETE - 3+ VIEW COMPARISON:  None. FINDINGS: No fracture or dislocation. The alignment and joint spaces are maintained. The ankle mortise is preserved. There is diffuse soft tissue edema. Lateral view limited by positioning. IMPRESSION: Diffuse soft tissue edema.  No fracture or dislocation. Electronically Signed   By: Jeb Levering M.D.   On: 09/06/2015 18:17   Dg Ankle Complete Right  Result Date: 09/06/2015 CLINICAL DATA:  Right and left ankle pain after fall stepping in a hole. EXAM: RIGHT ANKLE - COMPLETE 3+ VIEW COMPARISON:  None. FINDINGS: No fracture or dislocation. The alignment and joint spaces are maintained. Ankle mortise  is preserved. Diffuse soft tissue edema. Postsurgical change in the first ray and forefoot is partially included. IMPRESSION: Diffuse soft tissue edema.  No fracture or dislocation. Electronically Signed   By: Jeb Levering M.D.   On: 09/06/2015 18:18   Ct Head Wo Contrast  Result Date: 09/06/2015 CLINICAL DATA:  Fall this morning, several falls over the past few days. History of hypertension. EXAM: CT HEAD WITHOUT CONTRAST CT CERVICAL SPINE WITHOUT CONTRAST TECHNIQUE: Multidetector CT imaging of the head and cervical spine was performed following the standard protocol without intravenous contrast. Multiplanar CT image reconstructions of the cervical spine were also generated. COMPARISON:  Head CT dated 12/11/2014. FINDINGS: CT HEAD FINDINGS There is mild generalized age related atrophy with commensurate dilatation of the sulci. Ventricles remain normal in size and configuration. Minimal chronic small vessel ischemic change noted in the deep periventricular white matter  regions bilaterally. There is no mass, hemorrhage, edema or other evidence of acute parenchymal abnormality. No extra-axial hemorrhage. No osseous fracture or dislocation seen. CT CERVICAL SPINE FINDINGS Mild degenerative change in the mid cervical spine with slight disc space narrowings and minimal osseous spurring. No significant central canal stenosis at any level. Minimal retrolisthesis of C5 related to the underlying degenerative change. Alignment is otherwise normal. There is no fracture line or displaced fracture fragment identified. Facet joints are normally aligned throughout. Atherosclerotic calcifications noted at each carotid bulb region. Paravertebral soft tissues otherwise unremarkable. IMPRESSION: 1. Negative head CT. No intracranial mass, hemorrhage or edema. No skull fracture. 2. No fracture or acute subluxation within the cervical spine. Mild degenerative change within the mid cervical spine. Carotid atherosclerosis. Electronically Signed   By: Franki Cabot M.D.   On: 09/06/2015 19:32   Ct Cervical Spine Wo Contrast  Result Date: 09/06/2015 CLINICAL DATA:  Fall this morning, several falls over the past few days. History of hypertension. EXAM: CT HEAD WITHOUT CONTRAST CT CERVICAL SPINE WITHOUT CONTRAST TECHNIQUE: Multidetector CT imaging of the head and cervical spine was performed following the standard protocol without intravenous contrast. Multiplanar CT image reconstructions of the cervical spine were also generated. COMPARISON:  Head CT dated 12/11/2014. FINDINGS: CT HEAD FINDINGS There is mild generalized age related atrophy with commensurate dilatation of the sulci. Ventricles remain normal in size and configuration. Minimal chronic small vessel ischemic change noted in the deep periventricular white matter regions bilaterally. There is no mass, hemorrhage, edema or other evidence of acute parenchymal abnormality. No extra-axial hemorrhage. No osseous fracture or dislocation seen. CT  CERVICAL SPINE FINDINGS Mild degenerative change in the mid cervical spine with slight disc space narrowings and minimal osseous spurring. No significant central canal stenosis at any level. Minimal retrolisthesis of C5 related to the underlying degenerative change. Alignment is otherwise normal. There is no fracture line or displaced fracture fragment identified. Facet joints are normally aligned throughout. Atherosclerotic calcifications noted at each carotid bulb region. Paravertebral soft tissues otherwise unremarkable. IMPRESSION: 1. Negative head CT. No intracranial mass, hemorrhage or edema. No skull fracture. 2. No fracture or acute subluxation within the cervical spine. Mild degenerative change within the mid cervical spine. Carotid atherosclerosis. Electronically Signed   By: Franki Cabot M.D.   On: 09/06/2015 19:32   Ct Thoracic Spine Wo Contrast  Result Date: 09/06/2015 CLINICAL DATA:  Fall this morning, several falls over the past few days. EXAM: CT THORACIC SPINE WITHOUT CONTRAST TECHNIQUE: Multidetector CT imaging of the thoracic spine was performed without intravenous contrast administration. Multiplanar CT image reconstructions were also  generated. COMPARISON:  None. FINDINGS: Mild degenerative change within the kyphotic thoracic spine. Also mild dextroscoliosis. Additional mild degenerative change noted in the lower cervical spine. No fracture line or displaced fracture fragment identified. No acute or suspicious osseous finding. Facet joints appear normally aligned throughout. Atherosclerotic changes noted along the walls of the thoracic aorta. There is a hiatal hernia, moderate in size. Visualized paravertebral soft tissues are otherwise unremarkable. Within the visualized portions of the lungs, there is a ground-glass density consolidation in the right lower lobe could be lung contusion or aspiration. Mild scarring/fibrosis noted at the lung apices. IMPRESSION: 1. No acute fracture or  subluxation identified within the thoracic spine. Mild degenerative change. Kyphosis and scoliosis. 2. Ground-glass density consolidation in the right lower lobe, of uncertain acuity, possibly contusion or aspiration given the history of recent fall. Would consider follow-up chest CT in 1-2 months to ensure resolution and thereby ensure benignity. 3. Aortic atherosclerosis. 4. Hiatal hernia. Electronically Signed   By: Franki Cabot M.D.   On: 09/06/2015 20:00    Microbiology: No results found for this or any previous visit (from the past 240 hour(s)).   Labs: Basic Metabolic Panel:  Recent Labs Lab 09/06/15 1644 09/07/15 0556  NA 134* 138  K 2.7* 3.4*  CL 94* 98*  CO2 31 31  GLUCOSE 107* 92  BUN 16 14  CREATININE 0.97 0.87  CALCIUM 8.8* 8.5*  MG 2.1  --    CBC:  Recent Labs Lab 09/06/15 1644 09/07/15 0556  WBC 8.9 6.4  NEUTROABS 6.4  --   HGB 10.8* 10.6*  HCT 32.6* 32.4*  MCV 87.4 89.0  PLT 321 317   Cardiac Enzymes: No results for input(s): CKTOTAL, CKMB, CKMBINDEX, TROPONINI in the last 168 hours. BNP: BNP (last 3 results)  Recent Labs  03/11/15 1222  BNP 135.0*    Signed:  Christpoher Sievers MD. Rosalita Chessman. [facility] 09/07/2015, 12:21 PM

## 2015-09-07 NOTE — Progress Notes (Signed)
This RN explained the complications and risk of leaving hospital AMA.  Pt still wishes to leave.  AMA paperwork signed and placed on chart. IV removed.

## 2015-09-16 DIAGNOSIS — F3132 Bipolar disorder, current episode depressed, moderate: Secondary | ICD-10-CM | POA: Diagnosis not present

## 2015-09-16 DIAGNOSIS — E876 Hypokalemia: Secondary | ICD-10-CM | POA: Diagnosis not present

## 2015-09-16 DIAGNOSIS — R9431 Abnormal electrocardiogram [ECG] [EKG]: Secondary | ICD-10-CM | POA: Diagnosis not present

## 2015-09-16 DIAGNOSIS — M546 Pain in thoracic spine: Secondary | ICD-10-CM | POA: Diagnosis not present

## 2015-09-16 DIAGNOSIS — E039 Hypothyroidism, unspecified: Secondary | ICD-10-CM | POA: Diagnosis not present

## 2015-09-30 DIAGNOSIS — Z79891 Long term (current) use of opiate analgesic: Secondary | ICD-10-CM | POA: Diagnosis not present

## 2015-09-30 DIAGNOSIS — G894 Chronic pain syndrome: Secondary | ICD-10-CM | POA: Diagnosis not present

## 2015-09-30 DIAGNOSIS — M5134 Other intervertebral disc degeneration, thoracic region: Secondary | ICD-10-CM | POA: Diagnosis not present

## 2015-10-01 DIAGNOSIS — F3132 Bipolar disorder, current episode depressed, moderate: Secondary | ICD-10-CM | POA: Diagnosis not present

## 2015-10-04 DIAGNOSIS — H353231 Exudative age-related macular degeneration, bilateral, with active choroidal neovascularization: Secondary | ICD-10-CM | POA: Diagnosis not present

## 2015-10-23 DIAGNOSIS — H401132 Primary open-angle glaucoma, bilateral, moderate stage: Secondary | ICD-10-CM | POA: Diagnosis not present

## 2015-10-31 DIAGNOSIS — Z23 Encounter for immunization: Secondary | ICD-10-CM | POA: Diagnosis not present

## 2015-11-13 DIAGNOSIS — H353231 Exudative age-related macular degeneration, bilateral, with active choroidal neovascularization: Secondary | ICD-10-CM | POA: Diagnosis not present

## 2015-12-17 DIAGNOSIS — R05 Cough: Secondary | ICD-10-CM | POA: Diagnosis not present

## 2015-12-17 DIAGNOSIS — R42 Dizziness and giddiness: Secondary | ICD-10-CM | POA: Diagnosis not present

## 2015-12-17 DIAGNOSIS — F3132 Bipolar disorder, current episode depressed, moderate: Secondary | ICD-10-CM | POA: Diagnosis not present

## 2015-12-17 DIAGNOSIS — I10 Essential (primary) hypertension: Secondary | ICD-10-CM | POA: Diagnosis not present

## 2015-12-17 DIAGNOSIS — R350 Frequency of micturition: Secondary | ICD-10-CM | POA: Diagnosis not present

## 2015-12-20 ENCOUNTER — Other Ambulatory Visit: Payer: Self-pay | Admitting: Nurse Practitioner

## 2015-12-25 DIAGNOSIS — H353231 Exudative age-related macular degeneration, bilateral, with active choroidal neovascularization: Secondary | ICD-10-CM | POA: Diagnosis not present

## 2016-01-31 DIAGNOSIS — G894 Chronic pain syndrome: Secondary | ICD-10-CM | POA: Diagnosis not present

## 2016-01-31 DIAGNOSIS — M5134 Other intervertebral disc degeneration, thoracic region: Secondary | ICD-10-CM | POA: Diagnosis not present

## 2016-01-31 DIAGNOSIS — Z79891 Long term (current) use of opiate analgesic: Secondary | ICD-10-CM | POA: Diagnosis not present

## 2016-02-12 DIAGNOSIS — H353231 Exudative age-related macular degeneration, bilateral, with active choroidal neovascularization: Secondary | ICD-10-CM | POA: Diagnosis not present

## 2016-02-24 DIAGNOSIS — E039 Hypothyroidism, unspecified: Secondary | ICD-10-CM | POA: Diagnosis not present

## 2016-02-24 DIAGNOSIS — J45909 Unspecified asthma, uncomplicated: Secondary | ICD-10-CM | POA: Diagnosis not present

## 2016-02-24 DIAGNOSIS — F3132 Bipolar disorder, current episode depressed, moderate: Secondary | ICD-10-CM | POA: Diagnosis not present

## 2016-04-06 DIAGNOSIS — M545 Low back pain: Secondary | ICD-10-CM | POA: Diagnosis not present

## 2016-04-09 DIAGNOSIS — H353231 Exudative age-related macular degeneration, bilateral, with active choroidal neovascularization: Secondary | ICD-10-CM | POA: Diagnosis not present

## 2016-04-13 ENCOUNTER — Other Ambulatory Visit (HOSPITAL_COMMUNITY): Payer: Self-pay | Admitting: Orthopedic Surgery

## 2016-04-13 DIAGNOSIS — M545 Low back pain: Secondary | ICD-10-CM

## 2016-04-17 ENCOUNTER — Ambulatory Visit (HOSPITAL_COMMUNITY)
Admission: RE | Admit: 2016-04-17 | Discharge: 2016-04-17 | Disposition: A | Discharge status: Home / Self Care | Payer: Medicare Other | Source: Ambulatory Visit | Attending: Orthopedic Surgery | Admitting: Orthopedic Surgery

## 2016-04-17 DIAGNOSIS — M47816 Spondylosis without myelopathy or radiculopathy, lumbar region: Secondary | ICD-10-CM | POA: Diagnosis not present

## 2016-04-17 DIAGNOSIS — M545 Low back pain: Secondary | ICD-10-CM

## 2016-04-17 DIAGNOSIS — M4854XA Collapsed vertebra, not elsewhere classified, thoracic region, initial encounter for fracture: Secondary | ICD-10-CM | POA: Insufficient documentation

## 2016-04-17 DIAGNOSIS — M4316 Spondylolisthesis, lumbar region: Secondary | ICD-10-CM | POA: Diagnosis not present

## 2016-05-14 DIAGNOSIS — M545 Low back pain: Secondary | ICD-10-CM | POA: Diagnosis not present

## 2016-05-15 DIAGNOSIS — G894 Chronic pain syndrome: Secondary | ICD-10-CM | POA: Diagnosis not present

## 2016-05-15 DIAGNOSIS — Z79891 Long term (current) use of opiate analgesic: Secondary | ICD-10-CM | POA: Diagnosis not present

## 2016-05-15 DIAGNOSIS — M5134 Other intervertebral disc degeneration, thoracic region: Secondary | ICD-10-CM | POA: Diagnosis not present

## 2016-05-22 DIAGNOSIS — H353231 Exudative age-related macular degeneration, bilateral, with active choroidal neovascularization: Secondary | ICD-10-CM | POA: Diagnosis not present

## 2016-06-11 DIAGNOSIS — J45909 Unspecified asthma, uncomplicated: Secondary | ICD-10-CM | POA: Diagnosis not present

## 2016-06-11 DIAGNOSIS — I10 Essential (primary) hypertension: Secondary | ICD-10-CM | POA: Diagnosis not present

## 2016-06-11 DIAGNOSIS — Z0000 Encounter for general adult medical examination without abnormal findings: Secondary | ICD-10-CM | POA: Diagnosis not present

## 2016-06-11 DIAGNOSIS — F3132 Bipolar disorder, current episode depressed, moderate: Secondary | ICD-10-CM | POA: Diagnosis not present

## 2016-07-06 DIAGNOSIS — H353231 Exudative age-related macular degeneration, bilateral, with active choroidal neovascularization: Secondary | ICD-10-CM | POA: Diagnosis not present

## 2016-08-07 DIAGNOSIS — G894 Chronic pain syndrome: Secondary | ICD-10-CM | POA: Diagnosis not present

## 2016-08-07 DIAGNOSIS — M5134 Other intervertebral disc degeneration, thoracic region: Secondary | ICD-10-CM | POA: Diagnosis not present

## 2016-08-27 DIAGNOSIS — H353231 Exudative age-related macular degeneration, bilateral, with active choroidal neovascularization: Secondary | ICD-10-CM | POA: Diagnosis not present

## 2016-08-31 DIAGNOSIS — M546 Pain in thoracic spine: Secondary | ICD-10-CM | POA: Diagnosis not present

## 2016-09-01 ENCOUNTER — Other Ambulatory Visit (HOSPITAL_COMMUNITY): Payer: Self-pay | Admitting: Neurosurgery

## 2016-09-01 DIAGNOSIS — M546 Pain in thoracic spine: Secondary | ICD-10-CM

## 2016-09-02 ENCOUNTER — Other Ambulatory Visit: Payer: Self-pay | Admitting: Gastroenterology

## 2016-09-08 ENCOUNTER — Ambulatory Visit (HOSPITAL_COMMUNITY)
Admission: RE | Admit: 2016-09-08 | Discharge: 2016-09-08 | Disposition: A | Discharge status: Home / Self Care | Payer: Medicare Other | Source: Ambulatory Visit | Attending: Neurosurgery | Admitting: Neurosurgery

## 2016-09-08 DIAGNOSIS — M5124 Other intervertebral disc displacement, thoracic region: Secondary | ICD-10-CM | POA: Diagnosis not present

## 2016-09-08 DIAGNOSIS — K449 Diaphragmatic hernia without obstruction or gangrene: Secondary | ICD-10-CM | POA: Insufficient documentation

## 2016-09-08 DIAGNOSIS — M4854XA Collapsed vertebra, not elsewhere classified, thoracic region, initial encounter for fracture: Secondary | ICD-10-CM | POA: Insufficient documentation

## 2016-09-08 DIAGNOSIS — M546 Pain in thoracic spine: Secondary | ICD-10-CM

## 2016-09-21 DIAGNOSIS — Z6827 Body mass index (BMI) 27.0-27.9, adult: Secondary | ICD-10-CM | POA: Diagnosis not present

## 2016-09-21 DIAGNOSIS — M546 Pain in thoracic spine: Secondary | ICD-10-CM | POA: Diagnosis not present

## 2016-09-21 DIAGNOSIS — I10 Essential (primary) hypertension: Secondary | ICD-10-CM | POA: Diagnosis not present

## 2016-09-23 DIAGNOSIS — H2511 Age-related nuclear cataract, right eye: Secondary | ICD-10-CM | POA: Diagnosis not present

## 2016-09-28 DIAGNOSIS — G894 Chronic pain syndrome: Secondary | ICD-10-CM | POA: Diagnosis not present

## 2016-09-28 DIAGNOSIS — M5134 Other intervertebral disc degeneration, thoracic region: Secondary | ICD-10-CM | POA: Diagnosis not present

## 2016-09-29 ENCOUNTER — Encounter: Payer: Self-pay | Admitting: *Deleted

## 2016-09-30 DIAGNOSIS — E785 Hyperlipidemia, unspecified: Secondary | ICD-10-CM | POA: Diagnosis not present

## 2016-09-30 DIAGNOSIS — Z23 Encounter for immunization: Secondary | ICD-10-CM | POA: Diagnosis not present

## 2016-09-30 DIAGNOSIS — F3132 Bipolar disorder, current episode depressed, moderate: Secondary | ICD-10-CM | POA: Diagnosis not present

## 2016-09-30 DIAGNOSIS — Z1159 Encounter for screening for other viral diseases: Secondary | ICD-10-CM | POA: Diagnosis not present

## 2016-09-30 DIAGNOSIS — J309 Allergic rhinitis, unspecified: Secondary | ICD-10-CM | POA: Diagnosis not present

## 2016-09-30 DIAGNOSIS — I519 Heart disease, unspecified: Secondary | ICD-10-CM | POA: Diagnosis not present

## 2016-09-30 DIAGNOSIS — I10 Essential (primary) hypertension: Secondary | ICD-10-CM | POA: Diagnosis not present

## 2016-09-30 DIAGNOSIS — E039 Hypothyroidism, unspecified: Secondary | ICD-10-CM | POA: Diagnosis not present

## 2016-10-06 ENCOUNTER — Ambulatory Visit
Admission: RE | Admit: 2016-10-06 | Discharge: 2016-10-06 | Disposition: A | Discharge status: Home / Self Care | Payer: Medicare Other | Source: Ambulatory Visit | Attending: Ophthalmology | Admitting: Ophthalmology

## 2016-10-06 ENCOUNTER — Ambulatory Visit: Payer: Medicare Other | Admitting: Anesthesiology

## 2016-10-06 ENCOUNTER — Encounter
Admission: RE | Disposition: A | Discharge status: Home / Self Care | Payer: Self-pay | Source: Ambulatory Visit | Attending: Ophthalmology

## 2016-10-06 DIAGNOSIS — R51 Headache: Secondary | ICD-10-CM | POA: Insufficient documentation

## 2016-10-06 DIAGNOSIS — R0601 Orthopnea: Secondary | ICD-10-CM | POA: Diagnosis not present

## 2016-10-06 DIAGNOSIS — H2511 Age-related nuclear cataract, right eye: Secondary | ICD-10-CM | POA: Insufficient documentation

## 2016-10-06 DIAGNOSIS — R062 Wheezing: Secondary | ICD-10-CM | POA: Insufficient documentation

## 2016-10-06 DIAGNOSIS — F329 Major depressive disorder, single episode, unspecified: Secondary | ICD-10-CM | POA: Diagnosis not present

## 2016-10-06 DIAGNOSIS — M4800 Spinal stenosis, site unspecified: Secondary | ICD-10-CM | POA: Diagnosis not present

## 2016-10-06 DIAGNOSIS — I10 Essential (primary) hypertension: Secondary | ICD-10-CM | POA: Diagnosis not present

## 2016-10-06 DIAGNOSIS — E039 Hypothyroidism, unspecified: Secondary | ICD-10-CM | POA: Diagnosis not present

## 2016-10-06 DIAGNOSIS — M8580 Other specified disorders of bone density and structure, unspecified site: Secondary | ICD-10-CM | POA: Insufficient documentation

## 2016-10-06 DIAGNOSIS — K449 Diaphragmatic hernia without obstruction or gangrene: Secondary | ICD-10-CM | POA: Diagnosis not present

## 2016-10-06 DIAGNOSIS — R0602 Shortness of breath: Secondary | ICD-10-CM | POA: Diagnosis not present

## 2016-10-06 DIAGNOSIS — D649 Anemia, unspecified: Secondary | ICD-10-CM | POA: Insufficient documentation

## 2016-10-06 DIAGNOSIS — Z882 Allergy status to sulfonamides status: Secondary | ICD-10-CM | POA: Diagnosis not present

## 2016-10-06 DIAGNOSIS — K219 Gastro-esophageal reflux disease without esophagitis: Secondary | ICD-10-CM | POA: Diagnosis not present

## 2016-10-06 DIAGNOSIS — M1990 Unspecified osteoarthritis, unspecified site: Secondary | ICD-10-CM | POA: Insufficient documentation

## 2016-10-06 DIAGNOSIS — M797 Fibromyalgia: Secondary | ICD-10-CM | POA: Diagnosis not present

## 2016-10-06 DIAGNOSIS — R002 Palpitations: Secondary | ICD-10-CM | POA: Diagnosis not present

## 2016-10-06 DIAGNOSIS — H9190 Unspecified hearing loss, unspecified ear: Secondary | ICD-10-CM | POA: Diagnosis not present

## 2016-10-06 DIAGNOSIS — R609 Edema, unspecified: Secondary | ICD-10-CM | POA: Diagnosis not present

## 2016-10-06 HISTORY — DX: Dyspnea, unspecified: R06.00

## 2016-10-06 HISTORY — DX: Unspecified osteoarthritis, unspecified site: M19.90

## 2016-10-06 HISTORY — DX: Spinal stenosis, lumbar region without neurogenic claudication: M48.061

## 2016-10-06 HISTORY — DX: Unspecified hearing loss, unspecified ear: H91.90

## 2016-10-06 HISTORY — PX: CATARACT EXTRACTION W/PHACO: SHX586

## 2016-10-06 HISTORY — DX: Personal history of other diseases of the digestive system: Z87.19

## 2016-10-06 SURGERY — PHACOEMULSIFICATION, CATARACT, WITH IOL INSERTION
Anesthesia: Monitor Anesthesia Care | Site: Eye | Laterality: Right | Wound class: Clean

## 2016-10-06 MED ORDER — SODIUM CHLORIDE 0.9 % IV SOLN
INTRAVENOUS | Status: DC
  Administered 2016-10-06: 10:00:00 via INTRAVENOUS

## 2016-10-06 MED ORDER — LIDOCAINE HCL (PF) 4 % IJ SOLN
INTRAMUSCULAR | Status: DC | PRN
  Administered 2016-10-06: 4 mL via OPHTHALMIC

## 2016-10-06 MED ORDER — FENTANYL CITRATE (PF) 100 MCG/2ML IJ SOLN
INTRAMUSCULAR | Status: DC | PRN
  Administered 2016-10-06 (×2): 25 ug via INTRAVENOUS

## 2016-10-06 MED ORDER — POVIDONE-IODINE 5 % OP SOLN
OPHTHALMIC | Status: AC
  Filled 2016-10-06: qty 30

## 2016-10-06 MED ORDER — MIDAZOLAM HCL 2 MG/2ML IJ SOLN
INTRAMUSCULAR | Status: AC
  Filled 2016-10-06: qty 2

## 2016-10-06 MED ORDER — FENTANYL CITRATE (PF) 100 MCG/2ML IJ SOLN
INTRAMUSCULAR | Status: AC
  Filled 2016-10-06: qty 2

## 2016-10-06 MED ORDER — EPINEPHRINE PF 1 MG/ML IJ SOLN
INTRAOCULAR | Status: DC | PRN
  Administered 2016-10-06: 12:00:00 via OPHTHALMIC

## 2016-10-06 MED ORDER — CARBACHOL 0.01 % IO SOLN
INTRAOCULAR | Status: DC | PRN
  Administered 2016-10-06: 0.5 mL via INTRAOCULAR

## 2016-10-06 MED ORDER — MOXIFLOXACIN HCL 0.5 % OP SOLN
OPHTHALMIC | Status: AC
  Filled 2016-10-06: qty 3

## 2016-10-06 MED ORDER — NA CHONDROIT SULF-NA HYALURON 40-17 MG/ML IO SOLN
INTRAOCULAR | Status: AC
  Filled 2016-10-06: qty 1

## 2016-10-06 MED ORDER — ARMC OPHTHALMIC DILATING DROPS
OPHTHALMIC | Status: AC
  Administered 2016-10-06: 1 via OPHTHALMIC
  Filled 2016-10-06: qty 0.4

## 2016-10-06 MED ORDER — ONDANSETRON HCL 4 MG/2ML IJ SOLN
INTRAMUSCULAR | Status: AC
  Filled 2016-10-06: qty 2

## 2016-10-06 MED ORDER — LIDOCAINE HCL (PF) 4 % IJ SOLN
INTRAMUSCULAR | Status: AC
  Filled 2016-10-06: qty 5

## 2016-10-06 MED ORDER — NA CHONDROIT SULF-NA HYALURON 40-17 MG/ML IO SOLN
INTRAOCULAR | Status: DC | PRN
  Administered 2016-10-06: 1 mL via INTRAOCULAR

## 2016-10-06 MED ORDER — ARMC OPHTHALMIC DILATING DROPS
1.0000 "application " | OPHTHALMIC | Status: AC
  Administered 2016-10-06 (×3): 1 via OPHTHALMIC

## 2016-10-06 MED ORDER — POVIDONE-IODINE 5 % OP SOLN
OPHTHALMIC | Status: DC | PRN
  Administered 2016-10-06: 1 via OPHTHALMIC

## 2016-10-06 MED ORDER — MOXIFLOXACIN HCL 0.5 % OP SOLN: 1.0000 [drp] | OPHTHALMIC | Status: DC | PRN

## 2016-10-06 MED ORDER — EPINEPHRINE PF 1 MG/ML IJ SOLN
INTRAMUSCULAR | Status: AC
  Filled 2016-10-06: qty 2

## 2016-10-06 MED ORDER — MOXIFLOXACIN HCL 0.5 % OP SOLN
OPHTHALMIC | Status: DC | PRN
  Administered 2016-10-06: 0.2 mL via OPHTHALMIC

## 2016-10-06 MED ORDER — MIDAZOLAM HCL 2 MG/2ML IJ SOLN
INTRAMUSCULAR | Status: DC | PRN
  Administered 2016-10-06: 2 mg via INTRAVENOUS

## 2016-10-06 SURGICAL SUPPLY — 16 items
GLOVE BIO SURGEON STRL SZ8 (GLOVE) ×2 IMPLANT
GLOVE BIOGEL M 6.5 STRL (GLOVE) ×2 IMPLANT
GLOVE SURG LX 8.0 MICRO (GLOVE) ×1
GLOVE SURG LX STRL 8.0 MICRO (GLOVE) ×1 IMPLANT
GOWN STRL REUS W/ TWL LRG LVL3 (GOWN DISPOSABLE) ×2 IMPLANT
GOWN STRL REUS W/TWL LRG LVL3 (GOWN DISPOSABLE) ×4
LABEL CATARACT MEDS ST (LABEL) ×2 IMPLANT
LENS IOL TECNIS ITEC 28.0 (Intraocular Lens) ×1 IMPLANT
PACK CATARACT (MISCELLANEOUS) ×2 IMPLANT
PACK CATARACT BRASINGTON LX (MISCELLANEOUS) ×2 IMPLANT
PACK EYE AFTER SURG (MISCELLANEOUS) ×2 IMPLANT
SOL BSS BAG (MISCELLANEOUS) ×2
SOLUTION BSS BAG (MISCELLANEOUS) ×1 IMPLANT
SYR 5ML LL (SYRINGE) ×2 IMPLANT
WATER STERILE IRR 250ML POUR (IV SOLUTION) ×2 IMPLANT
WIPE NON LINTING 3.25X3.25 (MISCELLANEOUS) ×2 IMPLANT

## 2016-10-06 NOTE — Anesthesia Preprocedure Evaluation (Addendum)
Anesthesia Evaluation  Patient identified by MRN, date of birth, ID band Patient awake    Reviewed: Allergy & Precautions, NPO status , Patient's Chart, lab work & pertinent test results, reviewed documented beta blocker date and time   Airway Mallampati: II  TM Distance: >3 FB     Dental  (+) Chipped, Partial Upper, Missing   Pulmonary shortness of breath, asthma ,           Cardiovascular hypertension, Pt. on medications and Pt. on home beta blockers      Neuro/Psych  Headaches, PSYCHIATRIC DISORDERS Depression    GI/Hepatic hiatal hernia, GERD  Controlled,  Endo/Other  Hypothyroidism   Renal/GU      Musculoskeletal  (+) Arthritis , Fibromyalgia -  Abdominal   Peds  Hematology  (+) anemia ,   Anesthesia Other Findings Takes long acting morphine q12. Hydrocodone for break thru pain. No hx of seizures, but does have migraines.  Reproductive/Obstetrics                            Anesthesia Physical Anesthesia Plan  ASA: III  Anesthesia Plan: MAC   Post-op Pain Management:    Induction:   PONV Risk Score and Plan:   Airway Management Planned:   Additional Equipment:   Intra-op Plan:   Post-operative Plan:   Informed Consent: I have reviewed the patients History and Physical, chart, labs and discussed the procedure including the risks, benefits and alternatives for the proposed anesthesia with the patient or authorized representative who has indicated his/her understanding and acceptance.     Plan Discussed with: CRNA  Anesthesia Plan Comments:         Anesthesia Quick Evaluation

## 2016-10-06 NOTE — H&P (Signed)
All labs reviewed. Abnormal studies sent to patients PCP when indicated.  Previous H&P reviewed, patient examined, there are NO CHANGES.  Heidi Fuentes LOUIS9/4/201811:43 AM

## 2016-10-06 NOTE — Transfer of Care (Deleted)
Immediate Anesthesia Transfer of Care Note  Patient: Heidi Fuentes  Procedure(s) Performed: Procedure(s) with comments: CATARACT EXTRACTION PHACO AND INTRAOCULAR LENS PLACEMENT (IOC) (Right) - [place] 00:32.5 AP% 14.5 CDE 4.71 Fluid pack lot # [id number]  Patient Location: PACU  Anesthesia Type:General  Level of Consciousness: sedated  Airway & Oxygen Therapy: Patient Spontanous Breathing and Patient connected to face mask oxygen  Post-op Assessment: Report given to RN and Post -op Vital signs reviewed and stable  Post vital signs: Reviewed and stable  Last Vitals:  Vitals:   10/06/16 1012 10/06/16 1212  BP: (!) 154/68 103/90  Pulse: 77 77  Resp: 16 12  Temp: (!) 36.4 C (!) 35.9 C  SpO2: 449% 67%    Complications: No apparent anesthesia complications

## 2016-10-06 NOTE — Transfer of Care (Signed)
Anesthesia Post Note  Patient: Heidi Fuentes  Procedure(s) Performed: Procedure(s) (LRB): CATARACT EXTRACTION PHACO AND INTRAOCULAR LENS PLACEMENT (IOC) (Right)  Anesthesia type: MAC  Patient location: Phase II  Post pain: Pain level controlled  Post assessment: Post-op Vital signs reviewed  Last Vitals:  Vitals:   10/06/16 1209 10/06/16 1212  BP: 103/90 103/90  Pulse:  77  Resp: 16 12  Temp: (!) 35.9 C (!) 35.9 C  SpO2: 100% 99%    Post vital signs: stable  Level of consciousness: Patient remains intubated per anesthesia plan  Complications: No apparent anesthesia complications

## 2016-10-06 NOTE — Anesthesia Post-op Follow-up Note (Signed)
Anesthesia QCDR form completed.        

## 2016-10-06 NOTE — Discharge Instructions (Signed)
Eye Surgery Discharge Instructions  Expect mild scratchy sensation or mild soreness. DO NOT RUB YOUR EYE!  The day of surgery:  Minimal physical activity, but bed rest is not required  No reading, computer work, or close hand work  No bending, lifting, or straining.  May watch TV  For 24 hours:  No driving, legal decisions, or alcoholic beverages  Safety precautions  Eat anything you prefer: It is better to start with liquids, then soup then solid foods.  _____ Eye patch should be worn until postoperative exam tomorrow.  ____ Solar shield eyeglasses should be worn for comfort in the sunlight/patch while sleeping  Resume all regular medications including aspirin or Coumadin if these were discontinued prior to surgery. You may shower, bathe, shave, or wash your hair. Tylenol may be taken for mild discomfort.  Call your doctor if you experience significant pain, nausea, or vomiting, fever > 101 or other signs of infection. [phone] or [phone] Specific instructions:  Follow-up Information    Birder Robson, MD Follow up.   Specialty:  Ophthalmology Why:  september 5 at 9:10am Contact information: [address] [place] [address] [phone]

## 2016-10-06 NOTE — Anesthesia Procedure Notes (Signed)
Procedure Name: MAC Date/Time: 10/06/2016 11:48 AM Performed by: Doreen Salvage Pre-anesthesia Checklist: Patient identified, Emergency Drugs available, Suction available and Patient being monitored Patient Re-evaluated:Patient Re-evaluated prior to induction Oxygen Delivery Method: Nasal cannula

## 2016-10-06 NOTE — Anesthesia Postprocedure Evaluation (Signed)
Anesthesia Post Note  Patient: Heidi Fuentes  Procedure(s) Performed: Procedure(s) (LRB): CATARACT EXTRACTION PHACO AND INTRAOCULAR LENS PLACEMENT (IOC) (Right)  Patient location during evaluation: PACU Anesthesia Type: MAC Level of consciousness: awake and alert Pain management: pain level controlled Vital Signs Assessment: post-procedure vital signs reviewed and stable Respiratory status: spontaneous breathing, nonlabored ventilation, respiratory function stable and patient connected to nasal cannula oxygen Cardiovascular status: stable and blood pressure returned to baseline Anesthetic complications: no     Last Vitals:  Vitals:   10/06/16 1209 10/06/16 1212  BP: 103/90 103/90  Pulse:  77  Resp: 16 12  Temp: (!) 35.9 C (!) 35.9 C  SpO2: 100% 99%    Last Pain:  Vitals:   10/06/16 1012  TempSrc: Oral  PainSc: 7                  Doreen Salvage A

## 2016-10-06 NOTE — Op Note (Signed)
PREOPERATIVE DIAGNOSIS:  Nuclear sclerotic cataract of the right eye.   POSTOPERATIVE DIAGNOSIS:  NUCLEAR SCLEROTIC CATARACT RIGHT EYE   OPERATIVE PROCEDURE: Procedure(s): CATARACT EXTRACTION PHACO AND INTRAOCULAR LENS PLACEMENT (IOC)   SURGEON:  Birder Robson, MD.   ANESTHESIA:  Anesthesiologist: Gunnar Bulla, MD CRNA: Doreen Salvage, CRNA  1.      Managed anesthesia care. 2.      0.42ml of Shugarcaine was instilled in the eye following the paracentesis.   COMPLICATIONS:  None.   TECHNIQUE:   Stop and chop   DESCRIPTION OF PROCEDURE:  The patient was examined and consented in the preoperative holding area where the aforementioned topical anesthesia was applied to the right eye and then brought back to the Operating Room where the right eye was prepped and draped in the usual sterile ophthalmic fashion and a lid speculum was placed. A paracentesis was created with the side port blade and the anterior chamber was filled with viscoelastic. A near clear corneal incision was performed with the steel keratome. A continuous curvilinear capsulorrhexis was performed with a cystotome followed by the capsulorrhexis forceps. Hydrodissection and hydrodelineation were carried out with BSS on a blunt cannula. The lens was removed in a stop and chop  technique and the remaining cortical material was removed with the irrigation-aspiration handpiece. The capsular bag was inflated with viscoelastic and the Technis [id number]  lens was placed in the capsular bag without complication. The remaining viscoelastic was removed from the eye with the irrigation-aspiration handpiece. The wounds were hydrated. The anterior chamber was flushed with Miostat and the eye was inflated to physiologic pressure. 0.60ml of Vigamox was placed in the anterior chamber. The wounds were found to be water tight. The eye was dressed with Vigamox. The patient was given protective glasses to wear throughout the day and a shield with which to  sleep tonight. The patient was also given drops with which to begin a drop regimen today and will follow-up with me in one day.  Implant Name Type Inv. Item Serial No. Manufacturer Lot No. LRB No. Used  LENS IOL DIOP 28.0 - [id number] Intraocular Lens LENS IOL DIOP 28.0 [id number]   Right 1   Procedure(s) with comments: CATARACT EXTRACTION PHACO AND INTRAOCULAR LENS PLACEMENT (IOC) (Right) - [place] 00:32.5 AP% 14.5 CDE 4.71 Fluid pack lot # [id number]  Electronically signed: [place] 10/06/2016 12:08 PM

## 2016-10-07 ENCOUNTER — Encounter: Payer: Self-pay | Admitting: Ophthalmology

## 2016-10-16 DIAGNOSIS — H353231 Exudative age-related macular degeneration, bilateral, with active choroidal neovascularization: Secondary | ICD-10-CM | POA: Diagnosis not present

## 2016-10-21 DIAGNOSIS — H2512 Age-related nuclear cataract, left eye: Secondary | ICD-10-CM | POA: Diagnosis not present

## 2016-10-27 ENCOUNTER — Encounter: Payer: Self-pay | Admitting: *Deleted

## 2016-11-03 ENCOUNTER — Encounter
Admission: RE | Disposition: A | Discharge status: Home / Self Care | Payer: Self-pay | Source: Ambulatory Visit | Attending: Ophthalmology

## 2016-11-03 ENCOUNTER — Encounter: Payer: Self-pay | Admitting: *Deleted

## 2016-11-03 ENCOUNTER — Ambulatory Visit
Admission: RE | Admit: 2016-11-03 | Discharge: 2016-11-03 | Disposition: A | Discharge status: Home / Self Care | Payer: Medicare Other | Source: Ambulatory Visit | Attending: Ophthalmology | Admitting: Ophthalmology

## 2016-11-03 ENCOUNTER — Ambulatory Visit: Payer: Medicare Other | Admitting: Certified Registered Nurse Anesthetist

## 2016-11-03 DIAGNOSIS — H2512 Age-related nuclear cataract, left eye: Secondary | ICD-10-CM | POA: Diagnosis not present

## 2016-11-03 DIAGNOSIS — M1990 Unspecified osteoarthritis, unspecified site: Secondary | ICD-10-CM | POA: Diagnosis not present

## 2016-11-03 DIAGNOSIS — G894 Chronic pain syndrome: Secondary | ICD-10-CM | POA: Insufficient documentation

## 2016-11-03 DIAGNOSIS — M797 Fibromyalgia: Secondary | ICD-10-CM | POA: Insufficient documentation

## 2016-11-03 DIAGNOSIS — E039 Hypothyroidism, unspecified: Secondary | ICD-10-CM | POA: Insufficient documentation

## 2016-11-03 DIAGNOSIS — H3530 Unspecified macular degeneration: Secondary | ICD-10-CM | POA: Insufficient documentation

## 2016-11-03 DIAGNOSIS — J45909 Unspecified asthma, uncomplicated: Secondary | ICD-10-CM | POA: Insufficient documentation

## 2016-11-03 DIAGNOSIS — I10 Essential (primary) hypertension: Secondary | ICD-10-CM | POA: Diagnosis not present

## 2016-11-03 HISTORY — PX: CATARACT EXTRACTION W/PHACO: SHX586

## 2016-11-03 HISTORY — DX: Personal history of other specified conditions: Z87.898

## 2016-11-03 HISTORY — DX: Orthopnea: R06.01

## 2016-11-03 SURGERY — PHACOEMULSIFICATION, CATARACT, WITH IOL INSERTION
Anesthesia: Monitor Anesthesia Care | Site: Eye | Laterality: Left | Wound class: Clean

## 2016-11-03 MED ORDER — MOXIFLOXACIN HCL 0.5 % OP SOLN
OPHTHALMIC | Status: AC
  Filled 2016-11-03: qty 3

## 2016-11-03 MED ORDER — ARMC OPHTHALMIC DILATING DROPS
1.0000 "application " | OPHTHALMIC | Status: AC
  Administered 2016-11-03 (×3): 1 via OPHTHALMIC

## 2016-11-03 MED ORDER — POVIDONE-IODINE 5 % OP SOLN
OPHTHALMIC | Status: AC
  Filled 2016-11-03: qty 30

## 2016-11-03 MED ORDER — NA CHONDROIT SULF-NA HYALURON 40-17 MG/ML IO SOLN
INTRAOCULAR | Status: AC
  Filled 2016-11-03: qty 1

## 2016-11-03 MED ORDER — FENTANYL CITRATE (PF) 100 MCG/2ML IJ SOLN
INTRAMUSCULAR | Status: DC | PRN
  Administered 2016-11-03: 25 ug via INTRAVENOUS
  Administered 2016-11-03: 50 ug via INTRAVENOUS
  Administered 2016-11-03: 25 ug via INTRAVENOUS

## 2016-11-03 MED ORDER — BSS IO SOLN
INTRAOCULAR | Status: DC | PRN
  Administered 2016-11-03: 4 mL via OPHTHALMIC

## 2016-11-03 MED ORDER — MOXIFLOXACIN HCL 0.5 % OP SOLN
1.0000 [drp] | OPHTHALMIC | Status: DC | PRN
Start: 2016-11-03 — End: 2016-11-03

## 2016-11-03 MED ORDER — SODIUM CHLORIDE 0.9 % IV SOLN
INTRAVENOUS | Status: DC
  Administered 2016-11-03: 09:00:00 via INTRAVENOUS

## 2016-11-03 MED ORDER — EPINEPHRINE PF 1 MG/ML IJ SOLN
INTRAMUSCULAR | Status: AC
  Filled 2016-11-03: qty 2

## 2016-11-03 MED ORDER — CARBACHOL 0.01 % IO SOLN
INTRAOCULAR | Status: DC | PRN
  Administered 2016-11-03: 0.5 mL via INTRAOCULAR

## 2016-11-03 MED ORDER — POVIDONE-IODINE 5 % OP SOLN
OPHTHALMIC | Status: DC | PRN
  Administered 2016-11-03: 1 via OPHTHALMIC

## 2016-11-03 MED ORDER — FENTANYL CITRATE (PF) 100 MCG/2ML IJ SOLN
INTRAMUSCULAR | Status: AC
  Filled 2016-11-03: qty 2

## 2016-11-03 MED ORDER — LIDOCAINE HCL (PF) 4 % IJ SOLN
INTRAMUSCULAR | Status: AC
  Filled 2016-11-03: qty 5

## 2016-11-03 MED ORDER — NA CHONDROIT SULF-NA HYALURON 40-17 MG/ML IO SOLN
INTRAOCULAR | Status: DC | PRN
  Administered 2016-11-03: 1 mL via INTRAOCULAR

## 2016-11-03 MED ORDER — BSS IO SOLN
INTRAOCULAR | Status: DC | PRN
  Administered 2016-11-03: 10:00:00 via OPHTHALMIC

## 2016-11-03 MED ORDER — ARMC OPHTHALMIC DILATING DROPS
OPHTHALMIC | Status: AC
  Filled 2016-11-03: qty 0.4

## 2016-11-03 MED ORDER — MOXIFLOXACIN HCL 0.5 % OP SOLN
OPHTHALMIC | Status: DC | PRN
  Administered 2016-11-03: 0.2 mL via OPHTHALMIC

## 2016-11-03 SURGICAL SUPPLY — 16 items
GLOVE BIO SURGEON STRL SZ8 (GLOVE) ×2 IMPLANT
GLOVE BIOGEL M 6.5 STRL (GLOVE) ×2 IMPLANT
GLOVE SURG LX 8.0 MICRO (GLOVE) ×1
GLOVE SURG LX STRL 8.0 MICRO (GLOVE) ×1 IMPLANT
GOWN STRL REUS W/ TWL LRG LVL3 (GOWN DISPOSABLE) ×2 IMPLANT
GOWN STRL REUS W/TWL LRG LVL3 (GOWN DISPOSABLE) ×4
LABEL CATARACT MEDS ST (LABEL) ×2 IMPLANT
LENS IOL TECNIS ITEC 26.0 (Intraocular Lens) ×1 IMPLANT
PACK CATARACT (MISCELLANEOUS) ×2 IMPLANT
PACK CATARACT BRASINGTON LX (MISCELLANEOUS) ×2 IMPLANT
PACK EYE AFTER SURG (MISCELLANEOUS) ×2 IMPLANT
SOL BSS BAG (MISCELLANEOUS) ×2
SOLUTION BSS BAG (MISCELLANEOUS) ×1 IMPLANT
SYR 5ML LL (SYRINGE) ×2 IMPLANT
WATER STERILE IRR 250ML POUR (IV SOLUTION) ×2 IMPLANT
WIPE NON LINTING 3.25X3.25 (MISCELLANEOUS) ×2 IMPLANT

## 2016-11-03 NOTE — Anesthesia Preprocedure Evaluation (Signed)
Anesthesia Evaluation  Patient identified by MRN, date of birth, ID band Patient awake    Reviewed: Allergy & Precautions, NPO status , Patient's Chart, lab work & pertinent test results, reviewed documented beta blocker date and time   History of Anesthesia Complications Negative for: history of anesthetic complications  Airway Mallampati: II  TM Distance: >3 FB     Dental  (+) Chipped, Partial Upper, Missing, Dental Advidsory Given   Pulmonary shortness of breath, asthma ,           Cardiovascular hypertension, Pt. on medications and Pt. 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 No date: Dyspnea No date: Esophageal dysmotility No date: Fibromyalgia No date: GERD (gastroesophageal reflux disease) No date: Glaucoma No date: H/O wheezing No date: History of hiatal hernia No date: History of palpitations No date: HOH (hard of hearing) No date: HOH (hard of hearing) No date: Hypertension No date: Hypothyroidism No date: Macular degeneration No date: Orthopnea No date: Stenosis, spinal, lumbar No date: Thyroid disease No date: Tubular 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)   Reproductive/Obstetrics negative OB ROS                             Anesthesia Physical  Anesthesia Plan  ASA: III  Anesthesia Plan: MAC   Post-op Pain Management:    Induction: Intravenous  PONV Risk Score and Plan: 2 and Ondansetron and Dexamethasone  Airway Management  Planned: Natural Airway and Nasal Cannula  Additional Equipment:   Intra-op Plan:   Post-operative Plan:   Informed Consent: I have reviewed the patients History and Physical, chart, labs and discussed the procedure including the risks, benefits and alternatives for the proposed anesthesia with the patient or authorized representative who has indicated his/her understanding and acceptance.     Plan Discussed with: CRNA  Anesthesia Plan Comments:         Anesthesia Quick Evaluation

## 2016-11-03 NOTE — Anesthesia Procedure Notes (Deleted)
Procedures

## 2016-11-03 NOTE — Anesthesia Postprocedure Evaluation (Signed)
Anesthesia Post Note  Patient: Heidi Fuentes  Procedure(s) Performed: CATARACT EXTRACTION PHACO AND INTRAOCULAR LENS PLACEMENT (IOC) (Left Eye)  Patient location during evaluation: PACU Anesthesia Type: MAC Level of consciousness: awake and alert and oriented Pain management: pain level controlled Vital Signs Assessment: post-procedure vital signs reviewed and stable Respiratory status: respiratory function stable Cardiovascular status: stable Anesthetic complications: no     Last Vitals:  Vitals:   11/03/16 0842  BP: (!) 167/93  Pulse: 69  Resp: 16  Temp: (!) 36.4 C  SpO2: 97%    Last Pain:  Vitals:   11/03/16 0842  TempSrc: Tympanic  PainSc: 3                  Blima Singer

## 2016-11-03 NOTE — Op Note (Signed)
PREOPERATIVE DIAGNOSIS:  Nuclear sclerotic cataract of the left eye.   POSTOPERATIVE DIAGNOSIS:  Nuclear sclerotic cataract of the left eye.   OPERATIVE PROCEDURE: Procedure(s): CATARACT EXTRACTION PHACO AND INTRAOCULAR LENS PLACEMENT (IOC)   SURGEON:  Birder Robson, MD.   ANESTHESIA:  Anesthesiologist: Martha Clan, MD CRNA: Demetrius Charity, CRNA  1.      Managed anesthesia care. 2.     0.11ml of Shugarcaine was instilled following the paracentesis   COMPLICATIONS:  None.   TECHNIQUE:   Stop and chop   DESCRIPTION OF PROCEDURE:  The patient was examined and consented in the preoperative holding area where the aforementioned topical anesthesia was applied to the left eye and then brought back to the Operating Room where the left eye was prepped and draped in the usual sterile ophthalmic fashion and a lid speculum was placed. A paracentesis was created with the side port blade and the anterior chamber was filled with viscoelastic. A near clear corneal incision was performed with the steel keratome. A continuous curvilinear capsulorrhexis was performed with a cystotome followed by the capsulorrhexis forceps. Hydrodissection and hydrodelineation were carried out with BSS on a blunt cannula. The lens was removed in a stop and chop  technique and the remaining cortical material was removed with the irrigation-aspiration handpiece. The capsular bag was inflated with viscoelastic and the Technis [id number] lens was placed in the capsular bag without complication. The remaining viscoelastic was removed from the eye with the irrigation-aspiration handpiece. The wounds were hydrated. The anterior chamber was flushed with Miostat and the eye was inflated to physiologic pressure. 0.77ml Vigamox was placed in the anterior chamber. The wounds were found to be water tight. The eye was dressed with Vigamox. The patient was given protective glasses to wear throughout the day and a shield with which to sleep  tonight. The patient was also given drops with which to begin a drop regimen today and will follow-up with me in one day.  Implant Name Type Inv. Item Serial No. Manufacturer Lot No. LRB No. Used  LENS IOL DIOP 26.0 - [id number] Intraocular Lens LENS IOL DIOP 26.0 [id number]   Left 1    Procedure(s) with comments: CATARACT EXTRACTION PHACO AND INTRAOCULAR LENS PLACEMENT (IOC) (Left) - [place] 00:36.6 AP% 16.0 CDE 5.86 Fluid Pack lot # [id number]  Electronically signed: [place] 11/03/2016 10:20 AM

## 2016-11-03 NOTE — Anesthesia Procedure Notes (Signed)
Procedure Name: MAC Performed by: Demetrius Charity Pre-anesthesia Checklist: Patient identified, Emergency Drugs available, Suction available, Patient being monitored and Timeout performed Oxygen Delivery Method: Nasal cannula

## 2016-11-03 NOTE — Discharge Instructions (Signed)
Eye Surgery Discharge Instructions  Expect mild scratchy sensation or mild soreness. DO NOT RUB YOUR EYE!  The day of surgery:  Minimal physical activity, but bed rest is not required  No reading, computer work, or close hand work  No bending, lifting, or straining.  May watch TV  For 24 hours:  No driving, legal decisions, or alcoholic beverages  Safety precautions  Eat anything you prefer: It is better to start with liquids, then soup then solid foods.  _____ Eye patch should be worn until postoperative exam tomorrow.  ____ Solar shield eyeglasses should be worn for comfort in the sunlight/patch while sleeping  Resume all regular medications including aspirin or Coumadin if these were discontinued prior to surgery. You may shower, bathe, shave, or wash your hair. Tylenol may be taken for mild discomfort.  Call your doctor if you experience significant pain, nausea, or vomiting, fever > 101 or other signs of infection. [phone] or [phone] Specific instructions:  Follow-up Information    Birder Robson, MD Follow up.   Specialty:  Ophthalmology Why:  October 3 at 9:05am Contact information: [address] [place] [address] [phone]

## 2016-11-03 NOTE — Transfer of Care (Signed)
Immediate Anesthesia Transfer of Care Note  Patient: Heidi Fuentes  Procedure(s) Performed: CATARACT EXTRACTION PHACO AND INTRAOCULAR LENS PLACEMENT (IOC) (Left Eye)  Patient Location: PACU  Anesthesia Type:MAC  Level of Consciousness: awake, alert  and oriented  Airway & Oxygen Therapy: Patient Spontanous Breathing  Post-op Assessment: Report given to RN and Post -op Vital signs reviewed and stable  Post vital signs: Reviewed and stable   Last Vitals:  Vitals:   11/03/16 0842  BP: (!) 167/93  Pulse: 69  Resp: 16  Temp: (!) 36.4 C  SpO2: 97%    Last Pain:  Vitals:   11/03/16 0842  TempSrc: Tympanic  PainSc: 3          Complications: No apparent anesthesia complications

## 2016-11-03 NOTE — H&P (Signed)
All labs reviewed. Abnormal studies sent to patients PCP when indicated.  Previous H&P reviewed, patient examined, there are NO CHANGES.  Heidi Fuentes LOUIS10/2/20189:51 AM

## 2016-11-03 NOTE — Anesthesia Post-op Follow-up Note (Signed)
Anesthesia QCDR form completed.        

## 2016-11-17 DIAGNOSIS — H353231 Exudative age-related macular degeneration, bilateral, with active choroidal neovascularization: Secondary | ICD-10-CM | POA: Diagnosis not present

## 2016-11-24 ENCOUNTER — Other Ambulatory Visit: Payer: Self-pay

## 2016-11-24 ENCOUNTER — Encounter: Payer: Self-pay | Admitting: Internal Medicine

## 2016-11-24 ENCOUNTER — Ambulatory Visit (INDEPENDENT_AMBULATORY_CARE_PROVIDER_SITE_OTHER): Payer: Medicare Other | Admitting: Internal Medicine

## 2016-11-24 VITALS — BP 145/75 | HR 77 | Temp 97.3°F | Ht 60.0 in | Wt 142.0 lb

## 2016-11-24 DIAGNOSIS — K219 Gastro-esophageal reflux disease without esophagitis: Secondary | ICD-10-CM

## 2016-11-24 DIAGNOSIS — K5903 Drug induced constipation: Secondary | ICD-10-CM

## 2016-11-24 DIAGNOSIS — R1310 Dysphagia, unspecified: Secondary | ICD-10-CM

## 2016-11-24 NOTE — Patient Instructions (Signed)
Barium pill esophagram to further evaluate dysphagia  Movantik 25 mg daily for constipation;  Samples provided x 3 weeks  Constipation information  Further recommendations to follow

## 2016-11-24 NOTE — Progress Notes (Signed)
Primary Care Physician:  Briscoe Deutscher, MD Primary Gastroenterologist:  Dr. Gala Romney  Pre-Procedure History & Physical: HPI:  Heidi Fuentes is a 71 y.o. female here for dysphagia and constipation.  History of Schatzki's ring  -  dilated 2015. Patient states dilation did not help very much. Describes multiple episodes of food impactions along the way.  Reflux symptoms well controlled on Protonix 40 mg daily. Has 5 cm hiatal hernia. Small adenoma removed in 2015; due for surveillance examination 2022.  Past Medical History:  Diagnosis Date  . Arthritis   . Asthma   . Back pain   . Chronic pain syndrome 07/10/2013  . Depression   . Duodenal papillary stenosis   . Dyspnea   . Esophageal dysmotility   . Fibromyalgia   . GERD (gastroesophageal reflux disease)   . Glaucoma   . H/O wheezing   . History of hiatal hernia   . History of palpitations   . HOH (hard of hearing)   . HOH (hard of hearing)   . Hypertension   . Hypothyroidism   . Macular degeneration   . Orthopnea   . Stenosis, spinal, lumbar   . Thyroid disease   . Tubular adenoma   . 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 CATARACT EXTRACTION W/PHACO Right 10/06/2016   Procedure: CATARACT EXTRACTION PHACO AND INTRAOCULAR LENS PLACEMENT (IOC);  Surgeon: Birder Robson, MD;  Location: [facility] ORS;  Service: Ophthalmology;  Laterality: Right;  [place] 00:32.5 AP% 14.5 CDE 4.71 Fluid pack lot # [id number]  . CATARACT EXTRACTION W/PHACO Left 11/03/2016   Procedure: CATARACT EXTRACTION PHACO AND INTRAOCULAR LENS PLACEMENT (IOC);  Surgeon: Birder Robson, MD;  Location: [facility] ORS;  Service: Ophthalmology;  Laterality: Left;  [place] 00:36.6 AP% 16.0 CDE 5.86 Fluid Pack lot # [id number]  . COCCYX REMOVAL    . colonoscopy  2005   Dr. Laural Golden: mild melanosis coli, otherwise normal  . COLONOSCOPY WITH PROPOFOL N/A 09/14/2013   XBJ:YNWGNFAOZ coli. Colonic diverticulosis. Single colonic.  Tubular adenoma. Next TCS 09/2020.  [name] ERCP  1997   [facility]: biliary manometry abnormal, subsequent sphincterotomy   . ESOPHAGOGASTRODUODENOSCOPY (EGD) WITH PROPOFOL N/A 09/14/2013   HYQ:MVHQIONG'[id number] ring. Hiatal hernia. Status post Venia Minks and biopsy disruption.   [name] FOOT SURGERY     X2  . HEMORROIDECTOMY    . HERNIA REPAIR     inguinal right  . MALONEY DILATION N/A 09/14/2013   Procedure: Venia Minks DILATION;  Surgeon: [name], MD;  Location: [facility];  Service: Endoscopy;  Laterality: N/A;  56  . POLYPECTOMY N/A 09/14/2013   Procedure: POLYPECTOMY;  Surgeon: [name], MD;  Location: [facility];  Service: Endoscopy;  Laterality: N/A;  . TONSILLECTOMY      Prior to Admission medications   Medication Sig Start Date End Date Taking? Authorizing Provider  acetaminophen (TYLENOL) 500 MG tablet Take 500 mg by mouth every 6 (six) hours as needed. For pain    Yes [provider]  albuterol (VENTOLIN HFA) 108 (90 BASE) MCG/ACT inhaler Inhale 2 puffs into the lungs every 3 (three) hours as needed for wheezing or shortness of breath. For shortness of breath   Yes [provider]  Biotin 10000 MCG TABS Take 1 capsule by mouth daily.   Yes [provider]  buPROPion (WELLBUTRIN XL) 300 MG 24 hr tablet Take 300 mg by mouth daily.   Yes [provider]  citalopram (CELEXA)  10 MG tablet Take 10 mg by mouth daily.   Yes [provider]  ferrous sulfate 325 (65 FE) MG tablet Take 325 mg by mouth daily with breakfast.   Yes [provider]  fluticasone (FLONASE) 50 MCG/ACT nasal spray Place 2 sprays into both nostrils daily.    Yes [provider]  furosemide (LASIX) 40 MG tablet Take 40 mg by mouth daily. 04/18/13  Yes [provider]  lamoTRIgine (LAMICTAL) 150 MG tablet Take 300 mg by mouth daily.    Yes [provider]  latanoprost (XALATAN) 0.005 % ophthalmic solution Place 1 drop into both eyes at bedtime. 04/20/13  Yes  [provider]  levothyroxine (SYNTHROID, LEVOTHROID) 50 MCG tablet Take 50 mcg by mouth daily.     Yes [provider]  metoprolol succinate (TOPROL-XL) 50 MG 24 hr tablet Take 50 mg by mouth daily. 04/18/13  Yes [provider]  montelukast (SINGULAIR) 10 MG tablet Take 10 mg by mouth daily. 05/03/13  Yes [provider]  morphine (MS CONTIN) 15 MG 12 hr tablet Take 15 mg by mouth 2 (two) times daily. 03/05/15  Yes [provider]  Multiple Vitamins-Minerals (PRESERVISION/LUTEIN PO) Take 1 tablet by mouth daily.   Yes [provider]  pantoprazole (PROTONIX) 40 MG tablet TAKE 1 TABLET EVERY DAY 09/03/16  Yes Carlis Stable, NP  tiZANidine (ZANAFLEX) 4 MG tablet Take 4 mg by mouth 2 (two) times daily as needed for muscle spasms.   Yes [provider]  polyethylene glycol (MIRALAX / GLYCOLAX) packet Take 17 g by mouth daily.    [provider]    Allergies as of 11/24/2016 - Review Complete 11/24/2016  Allergen Reaction Noted  . Sulfa antibiotics Rash 08/26/2010    Family History  Problem Relation Age of Onset  . Lung cancer Mother   . Heart attack Father   . Bone cancer Brother   . COPD Sister   . Colon cancer Neg Hx     Social History   Social History  . Marital status: Divorced    Spouse name: N/A  . Number of children: N/A  . Years of education: N/A   Occupational History  . Not on file.   Social History Main Topics  . Smoking status: Never Smoker  . Smokeless tobacco: Never Used     Comment: NEVER SMOKED  . Alcohol use No  . Drug use: No  . Sexual activity: Not on file   Other Topics Concern  . Not on file   Social History Narrative  . No narrative on file    Review of Systems: See HPI, otherwise negative ROS  Physical Exam: BP (!) 145/75   Pulse 77   Temp (!) 97.3 F (36.3 C) (Oral)   Ht 5' (1.524 m)   Wt 142 lb (64.4 kg)   BMI 27.73 kg/m  General:   Alert,  Well-developed,  well-nourished, pleasant and cooperative in NAD Neck:  Supple; no masses or thyromegaly. No significant cervical adenopathy. Lungs:  Clear throughout to auscultation.   No wheezes, crackles, or rhonchi. No acute distress. Heart:  Regular rate and rhythm; no murmurs, clicks, rubs,  or gallops. Abdomen: Non-distended, normal bowel sounds.  Soft and nontender without appreciable mass or hepatosplenomegaly.  Pulses:  Normal pulses noted. Extremities:  Without clubbing or edema. Rectal:  Good sphincter tone scant brown stool in the rectal vault heme negative. No masses.   Impression:  71 year old lady on chronic opioid  therapy with secondary chronic constipation - inadequately managed at this time. History of small colonic adenoma removed 2015; due for 2022 surveillance examination. GERD symptoms  -  quiescent at this time.   Recommendations:   Barium pill esophagram to further evaluate dysphagia  Movantik 25 mg daily for constipation;  Samples provided x 3 weeks  Constipation information  Further recommendations to follow     Notice: This dictation was prepared with Dragon dictation along with smaller phrase technology. Any transcriptional errors that result from this process are unintentional and may not be corrected upon review.

## 2016-11-30 DIAGNOSIS — H353231 Exudative age-related macular degeneration, bilateral, with active choroidal neovascularization: Secondary | ICD-10-CM | POA: Diagnosis not present

## 2016-12-01 ENCOUNTER — Ambulatory Visit (HOSPITAL_COMMUNITY)
Admission: RE | Admit: 2016-12-01 | Discharge: 2016-12-01 | Disposition: A | Discharge status: Home / Self Care | Payer: Medicare Other | Source: Ambulatory Visit | Attending: Internal Medicine | Admitting: Internal Medicine

## 2016-12-01 DIAGNOSIS — K449 Diaphragmatic hernia without obstruction or gangrene: Secondary | ICD-10-CM | POA: Insufficient documentation

## 2016-12-01 DIAGNOSIS — K224 Dyskinesia of esophagus: Secondary | ICD-10-CM | POA: Insufficient documentation

## 2016-12-01 DIAGNOSIS — R1310 Dysphagia, unspecified: Secondary | ICD-10-CM | POA: Insufficient documentation

## 2016-12-07 ENCOUNTER — Telehealth: Payer: Self-pay

## 2016-12-07 ENCOUNTER — Telehealth: Payer: Self-pay | Admitting: Gastroenterology

## 2016-12-07 ENCOUNTER — Other Ambulatory Visit: Payer: Self-pay | Admitting: *Deleted

## 2016-12-07 DIAGNOSIS — R1310 Dysphagia, unspecified: Secondary | ICD-10-CM

## 2016-12-07 NOTE — Telephone Encounter (Signed)
Pt called for results of BPE. I went over the results ( see result) of the test and told her that Dr. Gala Romney is referring her to [facility] for manometry in [facility] to further measure the pressures in the esophagus. Fowarding to [facility] for referral.

## 2016-12-07 NOTE — Telephone Encounter (Signed)
Esophageal Manometry scheduled at [facility]. Date 12/21/16. Appt at 10:30 am Left a message on the home number to call back.

## 2016-12-07 NOTE — Telephone Encounter (Signed)
Epic referral sent for patient to [facility] for manometry in [place] to further measure the pressures in the esophagus. Please see referral and advise on scheduling.

## 2016-12-07 NOTE — Telephone Encounter (Signed)
I have placed this referral to [facility] in [place].

## 2016-12-08 NOTE — Telephone Encounter (Signed)
Spoke with the patient on her home phone. Explained the test. Confirmed the date. Information to be mailed. Confirmed the address.  Patient has a lot of questions about her condition and her symptoms. She reports a sensation of SOB when walking. She has not mentioned this to her provider. She says she has had a recent EKG. I have asked her to call her provider to make someone aware of this.

## 2016-12-09 NOTE — Telephone Encounter (Signed)
ok 

## 2016-12-18 DIAGNOSIS — H353231 Exudative age-related macular degeneration, bilateral, with active choroidal neovascularization: Secondary | ICD-10-CM | POA: Diagnosis not present

## 2016-12-19 DIAGNOSIS — G894 Chronic pain syndrome: Secondary | ICD-10-CM | POA: Diagnosis not present

## 2016-12-19 DIAGNOSIS — M5134 Other intervertebral disc degeneration, thoracic region: Secondary | ICD-10-CM | POA: Diagnosis not present

## 2016-12-21 ENCOUNTER — Encounter (HOSPITAL_COMMUNITY)
Admission: RE | Disposition: A | Discharge status: Home / Self Care | Payer: Self-pay | Source: Ambulatory Visit | Attending: Gastroenterology

## 2016-12-21 ENCOUNTER — Ambulatory Visit (HOSPITAL_COMMUNITY)
Admission: RE | Admit: 2016-12-21 | Discharge: 2016-12-21 | Disposition: A | Discharge status: Home / Self Care | Payer: Medicare Other | Source: Ambulatory Visit | Attending: Gastroenterology | Admitting: Gastroenterology

## 2016-12-21 DIAGNOSIS — E039 Hypothyroidism, unspecified: Secondary | ICD-10-CM | POA: Diagnosis not present

## 2016-12-21 DIAGNOSIS — R1310 Dysphagia, unspecified: Secondary | ICD-10-CM | POA: Diagnosis not present

## 2016-12-21 DIAGNOSIS — Z79899 Other long term (current) drug therapy: Secondary | ICD-10-CM | POA: Diagnosis not present

## 2016-12-21 DIAGNOSIS — F329 Major depressive disorder, single episode, unspecified: Secondary | ICD-10-CM | POA: Diagnosis not present

## 2016-12-21 DIAGNOSIS — G894 Chronic pain syndrome: Secondary | ICD-10-CM | POA: Insufficient documentation

## 2016-12-21 DIAGNOSIS — K224 Dyskinesia of esophagus: Secondary | ICD-10-CM | POA: Diagnosis not present

## 2016-12-21 DIAGNOSIS — I10 Essential (primary) hypertension: Secondary | ICD-10-CM | POA: Insufficient documentation

## 2016-12-21 DIAGNOSIS — K449 Diaphragmatic hernia without obstruction or gangrene: Secondary | ICD-10-CM | POA: Diagnosis not present

## 2016-12-21 HISTORY — PX: ESOPHAGEAL MANOMETRY: SHX5429

## 2016-12-21 SURGERY — MANOMETRY, ESOPHAGUS

## 2016-12-21 MED ORDER — LIDOCAINE VISCOUS 2 % MT SOLN
OROMUCOSAL | Status: AC
  Filled 2016-12-21: qty 15

## 2016-12-21 SURGICAL SUPPLY — 2 items
FACESHIELD LNG OPTICON STERILE (SAFETY) IMPLANT
GLOVE BIO SURGEON STRL SZ8 (GLOVE) ×6 IMPLANT

## 2016-12-21 NOTE — Progress Notes (Signed)
Esophageal Manometry done per protocol. Pt tolerated well without complication.   Report to be sent to Dr. Silverio Decamp.

## 2016-12-22 DIAGNOSIS — R1310 Dysphagia, unspecified: Secondary | ICD-10-CM

## 2016-12-22 DIAGNOSIS — K449 Diaphragmatic hernia without obstruction or gangrene: Secondary | ICD-10-CM

## 2016-12-23 ENCOUNTER — Encounter (HOSPITAL_COMMUNITY): Payer: Self-pay | Admitting: Gastroenterology

## 2017-01-01 DIAGNOSIS — H353222 Exudative age-related macular degeneration, left eye, with inactive choroidal neovascularization: Secondary | ICD-10-CM | POA: Diagnosis not present

## 2017-01-04 ENCOUNTER — Telehealth: Payer: Self-pay | Admitting: Internal Medicine

## 2017-01-04 NOTE — Telephone Encounter (Signed)
Pt called today to say that RMR referred her to [facility] for a manometry and she was calling to see if the results were available and what are his recommendations. Please call [phone]

## 2017-01-05 NOTE — Telephone Encounter (Signed)
Pt was given results and a message was sent to RMR. See other note.

## 2017-01-08 ENCOUNTER — Other Ambulatory Visit: Payer: Self-pay | Admitting: *Deleted

## 2017-01-08 DIAGNOSIS — K449 Diaphragmatic hernia without obstruction or gangrene: Secondary | ICD-10-CM

## 2017-01-18 DIAGNOSIS — H353222 Exudative age-related macular degeneration, left eye, with inactive choroidal neovascularization: Secondary | ICD-10-CM | POA: Diagnosis not present

## 2017-01-29 DIAGNOSIS — H353211 Exudative age-related macular degeneration, right eye, with active choroidal neovascularization: Secondary | ICD-10-CM | POA: Diagnosis not present

## 2017-01-29 DIAGNOSIS — H353231 Exudative age-related macular degeneration, bilateral, with active choroidal neovascularization: Secondary | ICD-10-CM | POA: Diagnosis not present

## 2017-02-02 HISTORY — PX: LAPAROSCOPIC PARAESOPHAGEAL HERNIA REPAIR: SHX6307

## 2017-02-03 DIAGNOSIS — K449 Diaphragmatic hernia without obstruction or gangrene: Secondary | ICD-10-CM | POA: Diagnosis not present

## 2017-02-03 DIAGNOSIS — Z79891 Long term (current) use of opiate analgesic: Secondary | ICD-10-CM | POA: Diagnosis not present

## 2017-02-03 DIAGNOSIS — Z723 Lack of physical exercise: Secondary | ICD-10-CM | POA: Diagnosis not present

## 2017-02-04 DIAGNOSIS — F1190 Opioid use, unspecified, uncomplicated: Secondary | ICD-10-CM | POA: Insufficient documentation

## 2017-02-08 DIAGNOSIS — Z79899 Other long term (current) drug therapy: Secondary | ICD-10-CM | POA: Insufficient documentation

## 2017-02-17 DIAGNOSIS — Y999 Unspecified external cause status: Secondary | ICD-10-CM | POA: Diagnosis not present

## 2017-02-17 DIAGNOSIS — I10 Essential (primary) hypertension: Secondary | ICD-10-CM | POA: Diagnosis present

## 2017-02-17 DIAGNOSIS — S0181XA Laceration without foreign body of other part of head, initial encounter: Secondary | ICD-10-CM | POA: Diagnosis not present

## 2017-02-17 DIAGNOSIS — Y9301 Activity, walking, marching and hiking: Secondary | ICD-10-CM | POA: Diagnosis not present

## 2017-02-17 DIAGNOSIS — E039 Hypothyroidism, unspecified: Secondary | ICD-10-CM | POA: Diagnosis not present

## 2017-02-17 DIAGNOSIS — Z888 Allergy status to other drugs, medicaments and biological substances status: Secondary | ICD-10-CM | POA: Diagnosis not present

## 2017-02-17 DIAGNOSIS — Z882 Allergy status to sulfonamides status: Secondary | ICD-10-CM | POA: Diagnosis not present

## 2017-02-17 DIAGNOSIS — K219 Gastro-esophageal reflux disease without esophagitis: Secondary | ICD-10-CM | POA: Diagnosis not present

## 2017-02-17 DIAGNOSIS — J45998 Other asthma: Secondary | ICD-10-CM | POA: Diagnosis not present

## 2017-02-17 DIAGNOSIS — Z23 Encounter for immunization: Secondary | ICD-10-CM | POA: Diagnosis not present

## 2017-02-17 DIAGNOSIS — W010XXA Fall on same level from slipping, tripping and stumbling without subsequent striking against object, initial encounter: Secondary | ICD-10-CM | POA: Diagnosis not present

## 2017-02-17 DIAGNOSIS — Z9181 History of falling: Secondary | ICD-10-CM | POA: Diagnosis not present

## 2017-02-17 DIAGNOSIS — Z01810 Encounter for preprocedural cardiovascular examination: Secondary | ICD-10-CM | POA: Diagnosis not present

## 2017-02-17 DIAGNOSIS — K449 Diaphragmatic hernia without obstruction or gangrene: Secondary | ICD-10-CM | POA: Diagnosis not present

## 2017-02-17 DIAGNOSIS — Z7982 Long term (current) use of aspirin: Secondary | ICD-10-CM | POA: Diagnosis not present

## 2017-02-17 DIAGNOSIS — Z79899 Other long term (current) drug therapy: Secondary | ICD-10-CM | POA: Diagnosis not present

## 2017-02-18 DIAGNOSIS — I10 Essential (primary) hypertension: Secondary | ICD-10-CM | POA: Diagnosis not present

## 2017-02-18 DIAGNOSIS — Z79899 Other long term (current) drug therapy: Secondary | ICD-10-CM | POA: Diagnosis not present

## 2017-02-18 DIAGNOSIS — E039 Hypothyroidism, unspecified: Secondary | ICD-10-CM | POA: Diagnosis not present

## 2017-02-18 DIAGNOSIS — K219 Gastro-esophageal reflux disease without esophagitis: Secondary | ICD-10-CM | POA: Diagnosis not present

## 2017-02-18 DIAGNOSIS — J45998 Other asthma: Secondary | ICD-10-CM | POA: Diagnosis not present

## 2017-02-18 DIAGNOSIS — K449 Diaphragmatic hernia without obstruction or gangrene: Secondary | ICD-10-CM | POA: Diagnosis not present

## 2017-02-19 DIAGNOSIS — Z79899 Other long term (current) drug therapy: Secondary | ICD-10-CM | POA: Diagnosis not present

## 2017-02-19 DIAGNOSIS — I10 Essential (primary) hypertension: Secondary | ICD-10-CM | POA: Diagnosis not present

## 2017-02-19 DIAGNOSIS — K449 Diaphragmatic hernia without obstruction or gangrene: Secondary | ICD-10-CM | POA: Diagnosis not present

## 2017-02-19 DIAGNOSIS — J45998 Other asthma: Secondary | ICD-10-CM | POA: Diagnosis not present

## 2017-02-19 DIAGNOSIS — E039 Hypothyroidism, unspecified: Secondary | ICD-10-CM | POA: Diagnosis not present

## 2017-02-19 DIAGNOSIS — K219 Gastro-esophageal reflux disease without esophagitis: Secondary | ICD-10-CM | POA: Diagnosis not present

## 2017-03-01 DIAGNOSIS — H353231 Exudative age-related macular degeneration, bilateral, with active choroidal neovascularization: Secondary | ICD-10-CM | POA: Diagnosis not present

## 2017-03-01 DIAGNOSIS — H353232 Exudative age-related macular degeneration, bilateral, with inactive choroidal neovascularization: Secondary | ICD-10-CM | POA: Diagnosis not present

## 2017-03-03 ENCOUNTER — Telehealth: Payer: Self-pay

## 2017-03-03 NOTE — Telephone Encounter (Signed)
Pt called to [facility] for samples of Movantik 25mg . Pt is out of medicine and isn't able to afford a prescription at this time. Pt hasn't had a bowel movement in 4 days. Samples of Movantik are ready for pick up.

## 2017-03-10 DIAGNOSIS — Z79891 Long term (current) use of opiate analgesic: Secondary | ICD-10-CM | POA: Insufficient documentation

## 2017-03-17 DIAGNOSIS — Z9889 Other specified postprocedural states: Secondary | ICD-10-CM | POA: Diagnosis not present

## 2017-03-17 DIAGNOSIS — K449 Diaphragmatic hernia without obstruction or gangrene: Secondary | ICD-10-CM | POA: Diagnosis not present

## 2017-03-31 DIAGNOSIS — K219 Gastro-esophageal reflux disease without esophagitis: Secondary | ICD-10-CM | POA: Diagnosis not present

## 2017-03-31 DIAGNOSIS — I519 Heart disease, unspecified: Secondary | ICD-10-CM | POA: Diagnosis not present

## 2017-03-31 DIAGNOSIS — Z23 Encounter for immunization: Secondary | ICD-10-CM | POA: Diagnosis not present

## 2017-03-31 DIAGNOSIS — E039 Hypothyroidism, unspecified: Secondary | ICD-10-CM | POA: Diagnosis not present

## 2017-03-31 DIAGNOSIS — E785 Hyperlipidemia, unspecified: Secondary | ICD-10-CM | POA: Diagnosis not present

## 2017-03-31 DIAGNOSIS — I10 Essential (primary) hypertension: Secondary | ICD-10-CM | POA: Diagnosis not present

## 2017-03-31 DIAGNOSIS — F3132 Bipolar disorder, current episode depressed, moderate: Secondary | ICD-10-CM | POA: Diagnosis not present

## 2017-04-02 DIAGNOSIS — H353231 Exudative age-related macular degeneration, bilateral, with active choroidal neovascularization: Secondary | ICD-10-CM | POA: Diagnosis not present

## 2017-04-20 DIAGNOSIS — M546 Pain in thoracic spine: Secondary | ICD-10-CM | POA: Diagnosis not present

## 2017-04-20 DIAGNOSIS — M545 Low back pain: Secondary | ICD-10-CM | POA: Diagnosis not present

## 2017-04-20 DIAGNOSIS — G894 Chronic pain syndrome: Secondary | ICD-10-CM | POA: Diagnosis not present

## 2017-05-03 DIAGNOSIS — H353231 Exudative age-related macular degeneration, bilateral, with active choroidal neovascularization: Secondary | ICD-10-CM | POA: Diagnosis not present

## 2017-05-25 DIAGNOSIS — Z79899 Other long term (current) drug therapy: Secondary | ICD-10-CM | POA: Diagnosis not present

## 2017-05-25 DIAGNOSIS — M546 Pain in thoracic spine: Secondary | ICD-10-CM | POA: Diagnosis not present

## 2017-05-25 DIAGNOSIS — G894 Chronic pain syndrome: Secondary | ICD-10-CM | POA: Diagnosis not present

## 2017-05-25 DIAGNOSIS — M545 Low back pain: Secondary | ICD-10-CM | POA: Diagnosis not present

## 2017-06-02 DIAGNOSIS — H353231 Exudative age-related macular degeneration, bilateral, with active choroidal neovascularization: Secondary | ICD-10-CM | POA: Diagnosis not present

## 2017-06-18 DIAGNOSIS — K449 Diaphragmatic hernia without obstruction or gangrene: Secondary | ICD-10-CM | POA: Diagnosis not present

## 2017-06-18 DIAGNOSIS — Z9889 Other specified postprocedural states: Secondary | ICD-10-CM | POA: Diagnosis not present

## 2017-06-18 DIAGNOSIS — Z8719 Personal history of other diseases of the digestive system: Secondary | ICD-10-CM | POA: Diagnosis not present

## 2017-07-01 DIAGNOSIS — H353222 Exudative age-related macular degeneration, left eye, with inactive choroidal neovascularization: Secondary | ICD-10-CM | POA: Diagnosis not present

## 2017-07-01 DIAGNOSIS — H02059 Trichiasis without entropian unspecified eye, unspecified eyelid: Secondary | ICD-10-CM | POA: Diagnosis not present

## 2017-07-01 DIAGNOSIS — H353231 Exudative age-related macular degeneration, bilateral, with active choroidal neovascularization: Secondary | ICD-10-CM | POA: Diagnosis not present

## 2017-07-30 DIAGNOSIS — H353222 Exudative age-related macular degeneration, left eye, with inactive choroidal neovascularization: Secondary | ICD-10-CM | POA: Diagnosis not present

## 2017-08-18 ENCOUNTER — Encounter (HOSPITAL_COMMUNITY): Payer: Self-pay

## 2017-08-18 ENCOUNTER — Emergency Department (HOSPITAL_COMMUNITY)
Admission: EM | Admit: 2017-08-18 | Discharge: 2017-08-18 | Disposition: A | Discharge status: Home / Self Care | Payer: Medicare Other | Attending: Emergency Medicine | Admitting: Emergency Medicine

## 2017-08-18 ENCOUNTER — Emergency Department (HOSPITAL_COMMUNITY): Payer: Medicare Other

## 2017-08-18 DIAGNOSIS — S2241XA Multiple fractures of ribs, right side, initial encounter for closed fracture: Secondary | ICD-10-CM | POA: Diagnosis not present

## 2017-08-18 DIAGNOSIS — R0602 Shortness of breath: Secondary | ICD-10-CM | POA: Insufficient documentation

## 2017-08-18 DIAGNOSIS — J45909 Unspecified asthma, uncomplicated: Secondary | ICD-10-CM | POA: Diagnosis not present

## 2017-08-18 DIAGNOSIS — I10 Essential (primary) hypertension: Secondary | ICD-10-CM | POA: Diagnosis not present

## 2017-08-18 DIAGNOSIS — Y999 Unspecified external cause status: Secondary | ICD-10-CM | POA: Diagnosis not present

## 2017-08-18 DIAGNOSIS — Y93H2 Activity, gardening and landscaping: Secondary | ICD-10-CM | POA: Diagnosis not present

## 2017-08-18 DIAGNOSIS — Z79899 Other long term (current) drug therapy: Secondary | ICD-10-CM | POA: Insufficient documentation

## 2017-08-18 DIAGNOSIS — W010XXA Fall on same level from slipping, tripping and stumbling without subsequent striking against object, initial encounter: Secondary | ICD-10-CM | POA: Insufficient documentation

## 2017-08-18 DIAGNOSIS — Y92007 Garden or yard of unspecified non-institutional (private) residence as the place of occurrence of the external cause: Secondary | ICD-10-CM | POA: Diagnosis not present

## 2017-08-18 DIAGNOSIS — S20301A Unspecified superficial injuries of right front wall of thorax, initial encounter: Secondary | ICD-10-CM | POA: Diagnosis present

## 2017-08-18 MED ORDER — ALBUTEROL SULFATE (2.5 MG/3ML) 0.083% IN NEBU
2.5000 mg | INHALATION_SOLUTION | Freq: Once | RESPIRATORY_TRACT | Status: AC
  Administered 2017-08-18: 2.5 mg via RESPIRATORY_TRACT
  Filled 2017-08-18: qty 3

## 2017-08-18 MED ORDER — OXYCODONE-ACETAMINOPHEN 5-325 MG PO TABS: 1.0000 | ORAL_TABLET | Freq: Three times a day (TID) | ORAL | 0 refills | Status: DC | PRN

## 2017-08-18 MED ORDER — METHYLPREDNISOLONE SODIUM SUCC 125 MG IJ SOLR
125.0000 mg | Freq: Once | INTRAMUSCULAR | Status: AC
  Administered 2017-08-18: 125 mg via INTRAMUSCULAR
  Filled 2017-08-18: qty 2

## 2017-08-18 MED ORDER — ACETAMINOPHEN 500 MG PO TABS
1000.0000 mg | ORAL_TABLET | Freq: Once | ORAL | Status: AC
  Administered 2017-08-18: 1000 mg via ORAL
  Filled 2017-08-18: qty 2

## 2017-08-18 NOTE — ED Provider Notes (Signed)
Medical screening examination/treatment/procedure(s) were conducted as a shared visit with non-physician practitioner(s) and myself.  I personally evaluated the patient during the encounter.  Clinical Impression:   Final diagnoses:  Closed fracture of multiple ribs of right side, initial encounter    The patient presents after having a fall 5 days ago when she was in her garden, lost her balance and fell onto her right side. She had acute onset of pain which is been persistent, worse with deep breathing, improved only minimally with taking hydrocodone which she R he had at home. She denies fevers or coughing, she does have pain worse with breathing or moving.  On exam the patient doesn't fact have tenderness over the right anterolateral chest wall without crepitance or subcutaneous emphysema. There is no bruising to that area though she does have some abrasions to the right forearm and elbow and distal upper extremity just above the elbow which have scabbed over and do not appear to be infected.  The patient's chest x-ray and rib x-rays do in fact show that she has 4 rib fractures on the right side, anterior second third fourth and fifth ribs, she has no signs of pneumothorax or pneumonia and on my exam she is able to speak in full sentences without distress. She will need to have pain control but at this point she has gone through the observation for a period, needs incentive spirometer and can be discharged and followed up in the outpatient setting. The patient is agreeable. She states that she has 2 children in law enforcement who can come by and help take care of her.   Noemi Chapel, MD 08/24/17 2051

## 2017-08-18 NOTE — ED Provider Notes (Signed)
[facility] Provider Note   CSN: [id number] Arrival date & time: 08/18/17  1559     History   Chief Complaint Chief Complaint  Patient presents with  . Fall    HPI Heidi Fuentes is a 72 y.o. female past medical history of asthma, chronic pain who presents for evaluation of right-sided chest pain that began approximately 5 days ago after mechanical fall.  Patient reports that she was working on her garden and states that she tripped over a stump, causing her to fall backwards and landed on her right side.  She states that she hit her right lateral chest wall and right arm.  She denies hitting her head and states she did not have any loss of consciousness and remembers the entire event. She is not any blood thinners.  Patient was reports she was able to get up from the incident.  Patient reports that since then, she has had pain to the right chest wall.  States that her pain is more severe underneath her right breast.  She states that she does not want to take a deep breath in because the pain is so severe.  She reports worsening pain with deep inspiration.  She also feels that she has been wheezing.  She does have a history of asthma.  She is using her inhalers with minimal improvement.  Patient denies any headache, neck pain, back pain, difficulty walking, vomiting.  The history is provided by the patient.    Past Medical History:  Diagnosis Date  . Arthritis   . Asthma   . Back pain   . Chronic pain syndrome 07/10/2013  . Depression   . Duodenal papillary stenosis   . Dyspnea   . Esophageal dysmotility   . Fibromyalgia   . GERD (gastroesophageal reflux disease)   . Glaucoma   . H/O wheezing   . History of hiatal hernia   . History of palpitations   . HOH (hard of hearing)   . HOH (hard of hearing)   . Hypertension   . Hypothyroidism   . Macular degeneration   . Orthopnea   . Stenosis, spinal, lumbar   . Thyroid disease   . Tubular adenoma   . 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 Dysphagia   . Hiatal hernia   . Hypokalemia 09/06/2015  . Prolonged Q-T interval on ECG 09/06/2015  . Fall   . IDA (iron deficiency anemia) 12/15/2013  . Schatzki's ring 08/17/2013  . Anemia 08/17/2013  . Encounter for screening colonoscopy 08/17/2013  . Constipation 07/10/2013  . Chronic pain syndrome 07/10/2013  . Depression 07/10/2013  . Hypothyroid 07/09/2013  . Colitis 07/09/2013  . Asthma 07/09/2013    Past Surgical History:  Procedure Laterality Date  . CATARACT EXTRACTION W/PHACO Right 10/06/2016   Procedure: CATARACT EXTRACTION PHACO AND INTRAOCULAR LENS PLACEMENT (IOC);  Surgeon: Birder Robson, MD;  Location: [facility] ORS;  Service: Ophthalmology;  Laterality: Right;  [place] 00:32.5 AP% 14.5 CDE 4.71 Fluid pack lot # [id number]  . CATARACT EXTRACTION W/PHACO Left 11/03/2016   Procedure: CATARACT EXTRACTION PHACO AND INTRAOCULAR LENS PLACEMENT (IOC);  Surgeon: Birder Robson, MD;  Location: [facility] ORS;  Service: Ophthalmology;  Laterality: Left;  [place] 00:36.6 AP% 16.0 CDE 5.86 Fluid Pack lot # [id number]  . COCCYX REMOVAL    . colonoscopy  2005   Dr. Laural Golden: mild melanosis coli, otherwise normal  . COLONOSCOPY  WITH PROPOFOL N/A 09/14/2013   EUM:PNTIRWERX coli. Colonic diverticulosis. Single colonic. Tubular adenoma. Next TCS 09/2020.  [name] ERCP  1997   [facility]: biliary manometry abnormal, subsequent sphincterotomy   . ESOPHAGEAL MANOMETRY N/A 12/21/2016   Procedure: ESOPHAGEAL MANOMETRY (EM);  Surgeon: Mauri Pole, MD;  Location: [facility];  Service: Endoscopy;  Laterality: N/A;  . ESOPHAGOGASTRODUODENOSCOPY (EGD) WITH PROPOFOL N/A 09/14/2013   VQM:[id number] ring. Hiatal hernia. Status post Venia Minks and biopsy disruption.   [name] FOOT SURGERY     X2  . HEMORROIDECTOMY    . HERNIA REPAIR     inguinal right  . [place] N/A 09/14/2013   Procedure: Venia Minks  DILATION;  Surgeon: [name], MD;  Location: [facility];  Service: Endoscopy;  Laterality: N/A;  56  . POLYPECTOMY N/A 09/14/2013   Procedure: POLYPECTOMY;  Surgeon: [name], MD;  Location: [facility];  Service: Endoscopy;  Laterality: N/A;  . TONSILLECTOMY       OB History    Gravida  2   Para  2   Term  2   Preterm      AB      Living        SAB      TAB      Ectopic      Multiple      Live Births               Home Medications    Prior to Admission medications   Medication Sig Start Date End Date Taking? Authorizing Provider  acetaminophen (TYLENOL) 500 MG tablet Take 500 mg by mouth every 6 (six) hours as needed. For pain     [provider]  albuterol (VENTOLIN HFA) 108 (90 BASE) MCG/ACT inhaler Inhale 2 puffs into the lungs every 3 (three) hours as needed for wheezing or shortness of breath. For shortness of breath    [provider]  Biotin 10000 MCG TABS Take 1 capsule by mouth daily.    [provider]  buPROPion (WELLBUTRIN XL) 300 MG 24 hr tablet Take 300 mg by mouth daily.    [provider]  citalopram (CELEXA) 10 MG tablet Take 10 mg by mouth daily.    [provider]  ferrous sulfate 325 (65 FE) MG tablet Take 325 mg by mouth daily with breakfast.    [provider]  fluticasone (FLONASE) 50 MCG/ACT nasal spray Place 2 sprays into both nostrils daily.     [provider]  furosemide (LASIX) 40 MG tablet Take 40 mg by mouth daily. 04/18/13   [provider]  lamoTRIgine (LAMICTAL) 150 MG tablet Take 300 mg by mouth daily.     [provider]  latanoprost (XALATAN) 0.005 % ophthalmic solution Place 1 drop into both eyes at bedtime. 04/20/13   [provider]  levothyroxine (SYNTHROID, LEVOTHROID) 50 MCG tablet Take 50 mcg by mouth daily.      [provider]  metoprolol succinate (TOPROL-XL) 50 MG 24 hr tablet Take 50 mg by mouth daily. 04/18/13    [provider]  montelukast (SINGULAIR) 10 MG tablet Take 10 mg by mouth daily. 05/03/13   [provider]  morphine (MS CONTIN) 15 MG 12 hr tablet Take 15 mg by mouth 2 (two) times daily. 03/05/15   [provider]  Multiple Vitamins-Minerals (PRESERVISION/LUTEIN PO) Take 1 tablet by mouth daily.    [provider]  oxyCODONE-acetaminophen (PERCOCET/ROXICET) 5-325  MG tablet Take 1-2 tablets by mouth every 8 (eight) hours as needed for severe pain. 08/18/17   Volanda Napoleon, PA-C  pantoprazole (PROTONIX) 40 MG tablet TAKE 1 TABLET EVERY DAY 09/03/16   Carlis Stable, NP  polyethylene glycol (MIRALAX / GLYCOLAX) packet Take 17 g by mouth daily.    [provider]  tiZANidine (ZANAFLEX) 4 MG tablet Take 4 mg by mouth 2 (two) times daily as needed for muscle spasms.    [provider]    Family History Family History  Problem Relation Age of Onset  . Lung cancer Mother   . Heart attack Father   . Bone cancer Brother   . COPD Sister   . Colon cancer Neg Hx     Social History Social History   Tobacco Use  . Smoking status: Never Smoker  . Smokeless tobacco: Never Used  . Tobacco comment: NEVER SMOKED  Substance Use Topics  . Alcohol use: No  . Drug use: No     Allergies   Geodon [ziprasidone hcl]; Trazodone and nefazodone; and Sulfa antibiotics   Review of Systems Review of Systems  Eyes: Negative for visual disturbance.  Respiratory: Positive for shortness of breath.   Musculoskeletal: Negative for back pain and neck pain.       Right chest wall pain  All other systems reviewed and are negative.    Physical Exam Updated Vital Signs BP (!) 170/73 (BP Location: Right Arm)   Pulse 75   Temp 97.7 F (36.5 C) (Oral)   Resp 20   Ht 5' (1.524 m)   Wt 63.5 kg (140 lb)   SpO2 95%   BMI 27.34 kg/m   Physical Exam  Constitutional: She is oriented to person, place, and time. She appears well-developed and well-nourished.    HENT:  Head: Normocephalic and atraumatic.  Mouth/Throat: Oropharynx is clear and moist and mucous membranes are normal.  Eyes: Pupils are equal, round, and reactive to light. Conjunctivae, EOM and lids are normal.  Neck: Full passive range of motion without pain.  Full flexion/extension and lateral movement of neck fully intact. No bony midline tenderness. No deformities or crepitus.   Cardiovascular: Normal rate, regular rhythm, normal heart sounds and normal pulses. Exam reveals no gallop and no friction rub.  No murmur heard. Pulmonary/Chest: Effort normal. No respiratory distress. She has wheezes. She exhibits tenderness.  Tenderness palpation noted to right anterior chest wall with most pain underneath the right breast.  No deformity or crepitus noted.  No evidence of respiratory distress.  Able to speak in full sentences without any difficulty.    Abdominal: Soft. Normal appearance. There is no tenderness. There is no rigidity and no guarding.  Musculoskeletal: Normal range of motion.       Thoracic back: She exhibits no tenderness.       Lumbar back: She exhibits no tenderness.  No tenderness to palpation to bilateral shoulders, clavicles, elbows, and wrists. No deformities or crepitus noted. FROM of BUE without difficulty.  No tenderness to palpation to bilateral knees and ankles. No deformities or crepitus noted. FROM of BLE without any difficulty.   Neurological: She is alert and oriented to person, place, and time.  Follows commands, Moves all extremities  5/5 strength to BUE and BLE  Sensation intact throughout all major nerve distributions Normal gait  Skin: Skin is warm and dry. Capillary refill takes less than 2 seconds.  Scattered abrasions noted to right upper extremity.  Small erythematous  rash noted underneath the right breast.  Psychiatric: She has a normal mood and affect. Her speech is normal.  Nursing note and vitals reviewed.    ED Treatments / Results   Labs (all labs ordered are listed, but only abnormal results are displayed) Labs Reviewed - No data to display  EKG None  Radiology Dg Ribs Unilateral W/chest Right  Result Date: 08/18/2017 CLINICAL DATA:  [place] on 08/13/2017 after tripping over a stump, fell onto a [place] bush, pain under RIGHT breast radiating through and around to posterior chest, pain with coughing, breathing and movement EXAM: RIGHT RIBS AND CHEST - 3+ VIEW COMPARISON:  09/06/2015 chest radiograph FINDINGS: Enlargement of cardiac silhouette. Atherosclerotic calcification and elongation of thoracic aorta. Mediastinal contours and pulmonary vascularity otherwise normal. Bronchitic changes with RIGHT basilar atelectasis. Lungs otherwise clear. No pulmonary infiltrate, pleural effusion or pneumothorax. Bones appear demineralized. Fractures of the RIGHT anterior second, third, fourth and fifth ribs identified. IMPRESSION: Fractures of the RIGHT anterior second through fifth ribs. Bronchitic changes with RIGHT basilar atelectasis. Enlargement of cardiac silhouette. Electronically Signed   By: Lavonia Dana M.D.   On: 08/18/2017 17:11    Procedures Procedures (including critical care time)  Medications Ordered in ED Medications  albuterol (PROVENTIL) (2.5 MG/3ML) 0.083% nebulizer solution 2.5 mg (2.5 mg Nebulization Given 08/18/17 1851)  methylPREDNISolone sodium succinate (SOLU-MEDROL) 125 mg/2 mL injection 125 mg (125 mg Intramuscular Given 08/18/17 1741)  acetaminophen (TYLENOL) tablet 1,000 mg (1,000 mg Oral Given 08/18/17 1741)     Initial Impression / Assessment and Plan / ED Course  I have reviewed the triage vital signs and the nursing notes.  Pertinent labs & imaging results that were available during my care of the patient were reviewed by me and considered in my medical decision making (see chart for details).     72 y.o. F who presents for evaluation of right anterior lateral chest wall pain after mechanical fall  approximately 5 days ago.  Reports that she was in the garden tripped over stopped causing her to fall and landed on her right side.  Since then has been having pain to the right chest wall.  Worse with deep inspiration.  Reports that she has been taking shallow breaths because of pain.  Denies any fevers.  Does have a history of asthma and states she feels like she has been wheezing.  Patient states that she does not hit her head but she did not have any LOC and is not on any blood thinners. Patient is afebrile, non-toxic appearing, sitting comfortably on examination table. Vital signs reviewed and stable.  Patient is tachypneic, likely because she is taking short shallow breaths but no evidence of hypoxia.  On exam, she has tenderness palpation noted to the anterior chest wall.  No deformity or crepitus noted.  On exam, she does exhibit some wheezing.  Consider rib fracture versus dislocation versus contusion.  Also consider asthma exacerbation.  Will check chest x-ray for evaluation of rib fracture and infectious etiology.  Analgesics provided in the ED.  Patient is driving home and I instructed her that she cannot have anything that will make her sleepy.  We will plan to give albuterol treatment to help with wheezing.  Review of records show that she had previously had prolonged QTC on EKG.  Her most recent EKG done in January 2018 showed a normal QTC.  We will proceed with albuterol nebulizer.  X-rays reviewed.  There are fractures of the right anterior second  through fifth ribs.  There is also some bronchitic changes with right basilar atelectasis.  No evidence of pneumonia.  Discussed results with patient.  Discussed patient with Dr. Sabra Heck who independently evaluated patient.  Given that patient is 5 days out from injury, is not hypoxic and with no evidence of fever or pneumonia on chest x-ray, and has stable vitals and is well-appearing, can plan to treat with outpatient pain control and incentive  spirometry.  Patient is agreeable to this plan.  Reevaluation after nebulizer treatment.  Some improvement in wheezing.  Patient is ambulate in the department.  O2 sats are greater than 95% on room air.  Patient stable for discharge at this time.  Encourage primary care follow-up. Patient had ample opportunity for questions and discussion. All patient's questions were answered with full understanding. Strict return precautions discussed. Patient expresses understanding and agreement to plan.    Final Clinical Impressions(s) / ED Diagnoses   Final diagnoses:  Closed fracture of multiple ribs of right side, initial encounter    ED Discharge Orders        Ordered    oxyCODONE-acetaminophen (PERCOCET/ROXICET) 5-325 MG tablet  Every 8 hours PRN     08/18/17 1912       Desma Mcgregor 08/18/17 2253    Noemi Chapel, MD 08/24/17 2051

## 2017-08-18 NOTE — ED Triage Notes (Signed)
Pt reports falling last Friday after tripping over a stump. Pt fell onto a [place] bush. Pt reports pain right rib area under breast. Pt reports pain is not improving. Pt feels asthma has gotten worse.Pain with coughing

## 2017-08-18 NOTE — ED Notes (Signed)
Resp tech to return for breathing treatment.

## 2017-08-18 NOTE — Discharge Instructions (Signed)
You can take 1000 mg of Tylenol.  Do not exceed 4000 mg of Tylenol a day.  You can take the pain medication for severe or breakthrough pain.  As we discussed, it is very important to use the incentive spirometer to prevent any pneumonia.  Follow-up with your primary care doctor in the next 2 to 4 days for further evaluation.  Return the emergency department for any fever, difficulty breathing, worsening pain, vomiting or any other worsening or concerning symptoms.

## 2017-09-03 DIAGNOSIS — H353222 Exudative age-related macular degeneration, left eye, with inactive choroidal neovascularization: Secondary | ICD-10-CM | POA: Diagnosis not present

## 2017-09-29 ENCOUNTER — Other Ambulatory Visit (HOSPITAL_COMMUNITY): Payer: Self-pay | Admitting: Family Medicine

## 2017-09-29 DIAGNOSIS — M4800 Spinal stenosis, site unspecified: Secondary | ICD-10-CM | POA: Diagnosis not present

## 2017-09-29 DIAGNOSIS — M797 Fibromyalgia: Secondary | ICD-10-CM | POA: Diagnosis not present

## 2017-09-29 DIAGNOSIS — E039 Hypothyroidism, unspecified: Secondary | ICD-10-CM | POA: Diagnosis not present

## 2017-09-29 DIAGNOSIS — F3132 Bipolar disorder, current episode depressed, moderate: Secondary | ICD-10-CM | POA: Diagnosis not present

## 2017-09-29 DIAGNOSIS — E2839 Other primary ovarian failure: Secondary | ICD-10-CM

## 2017-09-29 DIAGNOSIS — M8580 Other specified disorders of bone density and structure, unspecified site: Secondary | ICD-10-CM

## 2017-09-29 DIAGNOSIS — M810 Age-related osteoporosis without current pathological fracture: Secondary | ICD-10-CM | POA: Diagnosis not present

## 2017-09-29 DIAGNOSIS — E7800 Pure hypercholesterolemia, unspecified: Secondary | ICD-10-CM | POA: Diagnosis not present

## 2017-09-29 DIAGNOSIS — I10 Essential (primary) hypertension: Secondary | ICD-10-CM | POA: Diagnosis not present

## 2017-09-29 DIAGNOSIS — G894 Chronic pain syndrome: Secondary | ICD-10-CM | POA: Diagnosis not present

## 2017-09-29 DIAGNOSIS — J45909 Unspecified asthma, uncomplicated: Secondary | ICD-10-CM | POA: Diagnosis not present

## 2017-09-29 DIAGNOSIS — Z0000 Encounter for general adult medical examination without abnormal findings: Secondary | ICD-10-CM | POA: Diagnosis not present

## 2017-09-29 DIAGNOSIS — G43109 Migraine with aura, not intractable, without status migrainosus: Secondary | ICD-10-CM | POA: Diagnosis not present

## 2017-09-29 DIAGNOSIS — Z1231 Encounter for screening mammogram for malignant neoplasm of breast: Secondary | ICD-10-CM

## 2017-09-29 DIAGNOSIS — E559 Vitamin D deficiency, unspecified: Secondary | ICD-10-CM | POA: Diagnosis not present

## 2017-10-07 DIAGNOSIS — H353222 Exudative age-related macular degeneration, left eye, with inactive choroidal neovascularization: Secondary | ICD-10-CM | POA: Diagnosis not present

## 2017-10-16 DIAGNOSIS — M545 Low back pain: Secondary | ICD-10-CM | POA: Diagnosis not present

## 2017-10-21 ENCOUNTER — Ambulatory Visit (HOSPITAL_COMMUNITY)
Admission: RE | Admit: 2017-10-21 | Discharge: 2017-10-21 | Disposition: A | Discharge status: Home / Self Care | Payer: Medicare Other | Source: Ambulatory Visit | Attending: Family Medicine | Admitting: Family Medicine

## 2017-10-21 DIAGNOSIS — Z1231 Encounter for screening mammogram for malignant neoplasm of breast: Secondary | ICD-10-CM | POA: Diagnosis not present

## 2017-10-21 DIAGNOSIS — M8580 Other specified disorders of bone density and structure, unspecified site: Secondary | ICD-10-CM | POA: Insufficient documentation

## 2017-10-21 DIAGNOSIS — E2839 Other primary ovarian failure: Secondary | ICD-10-CM | POA: Insufficient documentation

## 2017-10-21 DIAGNOSIS — M85851 Other specified disorders of bone density and structure, right thigh: Secondary | ICD-10-CM | POA: Diagnosis not present

## 2017-10-21 DIAGNOSIS — Z13820 Encounter for screening for osteoporosis: Secondary | ICD-10-CM | POA: Insufficient documentation

## 2017-10-21 DIAGNOSIS — M8588 Other specified disorders of bone density and structure, other site: Secondary | ICD-10-CM | POA: Diagnosis not present

## 2017-10-26 ENCOUNTER — Emergency Department (HOSPITAL_COMMUNITY): Payer: Medicare Other

## 2017-10-26 ENCOUNTER — Emergency Department (HOSPITAL_COMMUNITY)
Admission: EM | Admit: 2017-10-26 | Discharge: 2017-10-29 | Disposition: A | Discharge status: Other Acute Inpatient Hospital | Payer: Medicare Other | Attending: Medical | Admitting: Medical

## 2017-10-26 ENCOUNTER — Other Ambulatory Visit: Payer: Self-pay

## 2017-10-26 DIAGNOSIS — J45909 Unspecified asthma, uncomplicated: Secondary | ICD-10-CM | POA: Diagnosis not present

## 2017-10-26 DIAGNOSIS — M549 Dorsalgia, unspecified: Secondary | ICD-10-CM | POA: Diagnosis not present

## 2017-10-26 DIAGNOSIS — M545 Low back pain: Secondary | ICD-10-CM | POA: Diagnosis not present

## 2017-10-26 DIAGNOSIS — I10 Essential (primary) hypertension: Secondary | ICD-10-CM | POA: Insufficient documentation

## 2017-10-26 DIAGNOSIS — R52 Pain, unspecified: Secondary | ICD-10-CM | POA: Diagnosis not present

## 2017-10-26 DIAGNOSIS — G8929 Other chronic pain: Secondary | ICD-10-CM | POA: Insufficient documentation

## 2017-10-26 DIAGNOSIS — E039 Hypothyroidism, unspecified: Secondary | ICD-10-CM | POA: Insufficient documentation

## 2017-10-26 DIAGNOSIS — J984 Other disorders of lung: Secondary | ICD-10-CM | POA: Diagnosis not present

## 2017-10-26 DIAGNOSIS — R4182 Altered mental status, unspecified: Secondary | ICD-10-CM | POA: Diagnosis not present

## 2017-10-26 DIAGNOSIS — R410 Disorientation, unspecified: Secondary | ICD-10-CM | POA: Diagnosis not present

## 2017-10-26 DIAGNOSIS — F989 Unspecified behavioral and emotional disorders with onset usually occurring in childhood and adolescence: Secondary | ICD-10-CM | POA: Diagnosis not present

## 2017-10-26 DIAGNOSIS — Z79899 Other long term (current) drug therapy: Secondary | ICD-10-CM | POA: Diagnosis not present

## 2017-10-26 DIAGNOSIS — R918 Other nonspecific abnormal finding of lung field: Secondary | ICD-10-CM

## 2017-10-26 DIAGNOSIS — F3113 Bipolar disorder, current episode manic without psychotic features, severe: Secondary | ICD-10-CM | POA: Insufficient documentation

## 2017-10-26 DIAGNOSIS — F919 Conduct disorder, unspecified: Secondary | ICD-10-CM

## 2017-10-26 DIAGNOSIS — R5381 Other malaise: Secondary | ICD-10-CM | POA: Diagnosis not present

## 2017-10-26 LAB — COMPREHENSIVE METABOLIC PANEL
ALK PHOS: 72 U/L (ref 38–126)
ALT: 49 U/L — AB (ref 0–44)
AST: 49 U/L — ABNORMAL HIGH (ref 15–41)
Albumin: 4.7 g/dL (ref 3.5–5.0)
Anion gap: 12 (ref 5–15)
BUN: 19 mg/dL (ref 8–23)
CALCIUM: 9.6 mg/dL (ref 8.9–10.3)
CO2: 25 mmol/L (ref 22–32)
CREATININE: 0.71 mg/dL (ref 0.44–1.00)
Chloride: 102 mmol/L (ref 98–111)
GFR calc non Af Amer: >60 mL/min (ref >60)
Glucose, Bld: 107 mg/dL — ABNORMAL HIGH (ref 70–99)
Potassium: 3.2 mmol/L — ABNORMAL LOW (ref 3.5–5.1)
Sodium: 139 mmol/L (ref 135–145)
Total Bilirubin: 0.7 mg/dL (ref 0.3–1.2)
Total Protein: 7.8 g/dL (ref 6.5–8.1)

## 2017-10-26 LAB — URINALYSIS, ROUTINE W REFLEX MICROSCOPIC
BACTERIA UA: NONE SEEN
Bilirubin Urine: NEGATIVE
Glucose, UA: NEGATIVE mg/dL
HGB URINE DIPSTICK: NEGATIVE
Ketones, ur: 80 mg/dL — AB
Leukocytes, UA: NEGATIVE
NITRITE: NEGATIVE
PROTEIN: 30 mg/dL — AB
Specific Gravity, Urine: 1.019 (ref 1.005–1.030)
pH: 6 (ref 5.0–8.0)

## 2017-10-26 LAB — CBC WITH DIFFERENTIAL/PLATELET
Basophils Absolute: 0 10*3/uL (ref 0.0–0.1)
Basophils Relative: 0 %
EOS PCT: 3 %
Eosinophils Absolute: 0.3 10*3/uL (ref 0.0–0.7)
HCT: 46.8 % — ABNORMAL HIGH (ref 36.0–46.0)
Hemoglobin: 15.9 g/dL — ABNORMAL HIGH (ref 12.0–15.0)
LYMPHS ABS: 2.4 10*3/uL (ref 0.7–4.0)
LYMPHS PCT: 19 %
MCH: 31.9 pg (ref 26.0–34.0)
MCHC: 34 g/dL (ref 30.0–36.0)
MCV: 94 fL (ref 78.0–100.0)
MONOS PCT: 11 %
Monocytes Absolute: 1.3 10*3/uL — ABNORMAL HIGH (ref 0.1–1.0)
Neutro Abs: 8.1 10*3/uL — ABNORMAL HIGH (ref 1.7–7.7)
Neutrophils Relative %: 67 %
PLATELETS: 300 10*3/uL (ref 150–400)
RBC: 4.98 MIL/uL (ref 3.87–5.11)
RDW: 13.3 % (ref 11.5–15.5)
WBC: 12.1 10*3/uL — AB (ref 4.0–10.5)

## 2017-10-26 LAB — RAPID URINE DRUG SCREEN, HOSP PERFORMED
Amphetamines: NOT DETECTED
Barbiturates: NOT DETECTED
Benzodiazepines: NOT DETECTED
Cocaine: NOT DETECTED
Opiates: POSITIVE — AB
Tetrahydrocannabinol: NOT DETECTED

## 2017-10-26 LAB — ACETAMINOPHEN LEVEL

## 2017-10-26 LAB — TROPONIN I

## 2017-10-26 LAB — ETHANOL

## 2017-10-26 LAB — SALICYLATE LEVEL

## 2017-10-26 MED ORDER — OXYCODONE-ACETAMINOPHEN 5-325 MG PO TABS
1.0000 | ORAL_TABLET | Freq: Three times a day (TID) | ORAL | Status: DC | PRN
  Administered 2017-10-28: 1 via ORAL
  Administered 2017-10-29: 2 via ORAL
  Administered 2017-10-29: 1 via ORAL
  Filled 2017-10-26 (×2): qty 1
  Filled 2017-10-26 (×2): qty 2

## 2017-10-26 MED ORDER — ALBUTEROL SULFATE HFA 108 (90 BASE) MCG/ACT IN AERS
2.0000 | INHALATION_SPRAY | RESPIRATORY_TRACT | Status: DC | PRN
  Administered 2017-10-29: 2 via RESPIRATORY_TRACT
  Filled 2017-10-26: qty 6.7

## 2017-10-26 MED ORDER — LEVOTHYROXINE SODIUM 50 MCG PO TABS
50.0000 ug | ORAL_TABLET | Freq: Every day | ORAL | Status: DC
  Administered 2017-10-28 – 2017-10-29 (×2): 50 ug via ORAL
  Filled 2017-10-26 (×2): qty 1

## 2017-10-26 MED ORDER — METOPROLOL SUCCINATE ER 25 MG PO TB24
50.0000 mg | ORAL_TABLET | Freq: Every day | ORAL | Status: DC
  Administered 2017-10-29 (×2): 50 mg via ORAL
  Filled 2017-10-26 (×3): qty 2

## 2017-10-26 MED ORDER — LATANOPROST 0.005 % OP SOLN
1.0000 [drp] | Freq: Every day | OPHTHALMIC | Status: DC
  Administered 2017-10-28: 1 [drp] via OPHTHALMIC
  Filled 2017-10-26 (×2): qty 2.5

## 2017-10-26 MED ORDER — CITALOPRAM HYDROBROMIDE 20 MG PO TABS
10.0000 mg | ORAL_TABLET | Freq: Every day | ORAL | Status: DC
  Administered 2017-10-29: 10 mg via ORAL
  Filled 2017-10-26 (×6): qty 0.5

## 2017-10-26 MED ORDER — LAMOTRIGINE 150 MG PO TABS
300.0000 mg | ORAL_TABLET | Freq: Every day | ORAL | Status: DC
  Administered 2017-10-29: 300 mg via ORAL
  Filled 2017-10-26 (×6): qty 2

## 2017-10-26 MED ORDER — POTASSIUM CHLORIDE CRYS ER 20 MEQ PO TBCR: 40.0000 meq | EXTENDED_RELEASE_TABLET | Freq: Once | ORAL | Status: DC

## 2017-10-26 MED ORDER — MONTELUKAST SODIUM 10 MG PO TABS
10.0000 mg | ORAL_TABLET | Freq: Every day | ORAL | Status: DC
  Administered 2017-10-29: 10 mg via ORAL
  Filled 2017-10-26 (×2): qty 1

## 2017-10-26 MED ORDER — PANTOPRAZOLE SODIUM 40 MG PO TBEC
40.0000 mg | DELAYED_RELEASE_TABLET | Freq: Every day | ORAL | Status: DC
  Administered 2017-10-29: 40 mg via ORAL
  Filled 2017-10-26 (×2): qty 1

## 2017-10-26 MED ORDER — FUROSEMIDE 40 MG PO TABS
40.0000 mg | ORAL_TABLET | Freq: Every day | ORAL | Status: DC
  Administered 2017-10-29: 40 mg via ORAL
  Filled 2017-10-26 (×2): qty 1

## 2017-10-26 MED ORDER — SODIUM CHLORIDE 0.9 % IV BOLUS
1000.0000 mL | Freq: Once | INTRAVENOUS | Status: AC
  Administered 2017-10-26: 1000 mL via INTRAVENOUS

## 2017-10-26 MED ORDER — BUPROPION HCL ER (XL) 300 MG PO TB24
300.0000 mg | ORAL_TABLET | Freq: Every day | ORAL | Status: DC
  Administered 2017-10-29: 300 mg via ORAL
  Filled 2017-10-26: qty 1
  Filled 2017-10-26: qty 2
  Filled 2017-10-26 (×4): qty 1

## 2017-10-26 MED ORDER — TIZANIDINE HCL 4 MG PO TABS
4.0000 mg | ORAL_TABLET | Freq: Two times a day (BID) | ORAL | Status: DC | PRN
  Filled 2017-10-26: qty 2

## 2017-10-26 NOTE — ED Provider Notes (Signed)
[facility] EMERGENCY DEPARTMENT Provider Note   CSN: [id number] Arrival date & time: 10/26/17  1819     History   Chief Complaint Chief Complaint  Patient presents with  . Back Pain    HPI Heidi Fuentes is a 72 y.o. female.  She has brought in by ambulance for unclear reason.  It sounds like she called them for evaluation of her back pain but they were concerned that she may be altered.  Patient herself is very tangential in her answers and tells me to look at up in the latter that she sent her doctor.  Level 5 caveat secondary to altered mental status.  The history is provided by the EMS personnel and the patient.  Back Pain   This is a chronic problem. The problem occurs constantly. The problem has not changed since onset.The pain is severe. The pain is the same all the time.    Past Medical History:  Diagnosis Date  . Arthritis   . Asthma   . Back pain   . Chronic pain syndrome 07/10/2013  . Depression   . Duodenal papillary stenosis   . Dyspnea   . Esophageal dysmotility   . Fibromyalgia   . GERD (gastroesophageal reflux disease)   . Glaucoma   . H/O wheezing   . History of hiatal hernia   . History of palpitations   . HOH (hard of hearing)   . HOH (hard of hearing)   . Hypertension   . Hypothyroidism   . Macular degeneration   . Orthopnea   . Stenosis, spinal, lumbar   . Thyroid disease   . Tubular adenoma   . 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 Dysphagia   . Hiatal hernia   . Hypokalemia 09/06/2015  . Prolonged Q-T interval on ECG 09/06/2015  . Fall   . IDA (iron deficiency anemia) 12/15/2013  . Schatzki's ring 08/17/2013  . Anemia 08/17/2013  . Encounter for screening colonoscopy 08/17/2013  . Constipation 07/10/2013  . Chronic pain syndrome 07/10/2013  . Depression 07/10/2013  . Hypothyroid 07/09/2013  . Colitis 07/09/2013  . Asthma 07/09/2013    Past Surgical History:    Procedure Laterality Date  . CATARACT EXTRACTION W/PHACO Right 10/06/2016   Procedure: CATARACT EXTRACTION PHACO AND INTRAOCULAR LENS PLACEMENT (IOC);  Surgeon: Birder Robson, MD;  Location: [facility] ORS;  Service: Ophthalmology;  Laterality: Right;  [place] 00:32.5 AP% 14.5 CDE 4.71 Fluid pack lot # [id number]  . CATARACT EXTRACTION W/PHACO Left 11/03/2016   Procedure: CATARACT EXTRACTION PHACO AND INTRAOCULAR LENS PLACEMENT (IOC);  Surgeon: Birder Robson, MD;  Location: [facility] ORS;  Service: Ophthalmology;  Laterality: Left;  [place] 00:36.6 AP% 16.0 CDE 5.86 Fluid Pack lot # [id number]  . COCCYX REMOVAL    . colonoscopy  2005   Dr. Laural Golden: mild melanosis coli, otherwise normal  . COLONOSCOPY WITH PROPOFOL N/A 09/14/2013   TFT:DDUKGURKY coli. Colonic diverticulosis. Single colonic. Tubular adenoma. Next TCS 09/2020.  [name] ERCP  1997   [facility]: biliary manometry abnormal, subsequent sphincterotomy   . ESOPHAGEAL MANOMETRY N/A 12/21/2016   Procedure: ESOPHAGEAL MANOMETRY (EM);  Surgeon: Mauri Pole, MD;  Location: [facility];  Service: Endoscopy;  Laterality: N/A;  . ESOPHAGOGASTRODUODENOSCOPY (EGD) WITH PROPOFOL N/A 09/14/2013   HCW:CBJSEGBT'D ring. Hiatal hernia. Status post Venia Minks and biopsy disruption.   [name] FOOT SURGERY     X2  . HEMORROIDECTOMY    . HERNIA  REPAIR     inguinal right  . [place] N/A 09/14/2013   Procedure: Venia Minks DILATION;  Surgeon: [name], MD;  Location: [facility] ORS;  Service: Endoscopy;  Laterality: N/A;  56  . POLYPECTOMY N/A 09/14/2013   Procedure: POLYPECTOMY;  Surgeon: [name], MD;  Location: [facility] ORS;  Service: Endoscopy;  Laterality: N/A;  . TONSILLECTOMY       OB History    Gravida  2   Para  2   Term  2   Preterm      AB      Living        SAB      TAB      Ectopic      Multiple      Live Births               Home Medications    Prior to Admission medications   Medication Sig Start Date End Date  Taking? Authorizing Provider  acetaminophen (TYLENOL) 500 MG tablet Take 500 mg by mouth every 6 (six) hours as needed. For pain     [provider]  albuterol (VENTOLIN HFA) 108 (90 BASE) MCG/ACT inhaler Inhale 2 puffs into the lungs every 3 (three) hours as needed for wheezing or shortness of breath. For shortness of breath    [provider]  Biotin 10000 MCG TABS Take 1 capsule by mouth daily.    [provider]  buPROPion (WELLBUTRIN XL) 300 MG 24 hr tablet Take 300 mg by mouth daily.    [provider]  citalopram (CELEXA) 10 MG tablet Take 10 mg by mouth daily.    [provider]  ferrous sulfate 325 (65 FE) MG tablet Take 325 mg by mouth daily with breakfast.    [provider]  fluticasone (FLONASE) 50 MCG/ACT nasal spray Place 2 sprays into both nostrils daily.     [provider]  furosemide (LASIX) 40 MG tablet Take 40 mg by mouth daily. 04/18/13   [provider]  lamoTRIgine (LAMICTAL) 150 MG tablet Take 300 mg by mouth daily.     [provider]  latanoprost (XALATAN) 0.005 % ophthalmic solution Place 1 drop into both eyes at bedtime. 04/20/13   [provider]  levothyroxine (SYNTHROID, LEVOTHROID) 50 MCG tablet Take 50 mcg by mouth daily.      [provider]  metoprolol succinate (TOPROL-XL) 50 MG 24 hr tablet Take 50 mg by mouth daily. 04/18/13   [provider]  montelukast (SINGULAIR) 10 MG tablet Take 10 mg by mouth daily. 05/03/13   [provider]  morphine (MS CONTIN) 15 MG 12 hr tablet Take 15 mg by mouth 2 (two) times daily. 03/05/15   [provider]  Multiple Vitamins-Minerals (PRESERVISION/LUTEIN PO) Take 1 tablet by mouth daily.    [provider]  oxyCODONE-acetaminophen (PERCOCET/ROXICET) 5-325 MG tablet Take 1-2 tablets by mouth every 8 (eight) hours as needed for severe pain. 08/18/17   Volanda Napoleon, PA-C  pantoprazole (PROTONIX) 40  MG tablet TAKE 1 TABLET EVERY DAY 09/03/16   Carlis Stable, NP  polyethylene glycol (MIRALAX / GLYCOLAX) packet Take 17 g by mouth daily.    [provider]  tiZANidine (ZANAFLEX) 4 MG tablet Take 4 mg by mouth 2 (two) times daily as needed for muscle spasms.    [provider]    Family History Family History  Problem Relation Age  of Onset  . Lung cancer Mother   . Heart attack Father   . Bone cancer Brother   . COPD Sister   . Colon cancer Neg Hx     Social History Social History   Tobacco Use  . Smoking status: Never Smoker  . Smokeless tobacco: Never Used  . Tobacco comment: NEVER SMOKED  Substance Use Topics  . Alcohol use: No  . Drug use: No     Allergies   Geodon [ziprasidone hcl]; Trazodone and nefazodone; and Sulfa antibiotics   Review of Systems Review of Systems  Unable to perform ROS: Mental status change  Musculoskeletal: Positive for back pain.     Physical Exam Updated Vital Signs BP (!) 174/86   Pulse (!) 103   Temp 98.5 F (36.9 C) (Oral)   Resp 17   Ht 5' (1.524 m)   Wt 70.3 kg   SpO2 98%   BMI 30.27 kg/m   Physical Exam  Constitutional: She appears well-developed and well-nourished. No distress.  HENT:  Head: Normocephalic and atraumatic.  Eyes: Conjunctivae are normal.  Neck: Neck supple.  Cardiovascular: Normal rate and regular rhythm.  No murmur heard. Pulmonary/Chest: Effort normal and breath sounds normal. No respiratory distress.  Abdominal: Soft. There is no tenderness.  Musculoskeletal: She exhibits no edema.  Neurological: She is alert.  Oriented to person only.  Moving all 4 extremities without obvious focal deficit.  Skin: Skin is warm and dry. Capillary refill takes less than 2 seconds.  Psychiatric: Her speech is tangential.  Nursing note and vitals reviewed.    ED Treatments / Results  Labs (all labs ordered are listed, but only abnormal results are displayed) Labs Reviewed  CBC WITH  DIFFERENTIAL/PLATELET - Abnormal; Notable for the following components:      Result Value   WBC 12.1 (*)    Hemoglobin 15.9 (*)    HCT 46.8 (*)    Neutro Abs 8.1 (*)    Monocytes Absolute 1.3 (*)    All other components within normal limits  COMPREHENSIVE METABOLIC PANEL - Abnormal; Notable for the following components:   Potassium 3.2 (*)    Glucose, Bld 107 (*)    AST 49 (*)    ALT 49 (*)    All other components within normal limits  RAPID URINE DRUG SCREEN, HOSP PERFORMED - Abnormal; Notable for the following components:   Opiates POSITIVE (*)    All other components within normal limits  URINALYSIS, ROUTINE W REFLEX MICROSCOPIC - Abnormal; Notable for the following components:   Ketones, ur 80 (*)    Protein, ur 30 (*)    All other components within normal limits  ACETAMINOPHEN LEVEL - Abnormal; Notable for the following components:   Acetaminophen (Tylenol), Serum <10 (*)    All other components within normal limits  TROPONIN I  ETHANOL  SALICYLATE LEVEL    EKG EKG Interpretation  Date/Time:  Tuesday October 26 2017 19:06:22 EDT Ventricular Rate:  106 PR Interval:    QRS Duration: 77 QT Interval:  343 QTC Calculation: 456 R Axis:   50 Text Interpretation:  Sinus tachycardia no acute st/ts  similar to prior 8/17 Confirmed by Aletta Edouard [phone]) on 10/26/2017 7:20:38 PM   Radiology Dg Chest 2 View  Result Date: 10/26/2017 CLINICAL DATA:  72 year old female with history of altered mental status. EXAM: CHEST - 2 VIEW COMPARISON:  Chest x-ray 08/18/2017. FINDINGS: Lung volumes are normal. No consolidative airspace disease. No pleural effusions. No pneumothorax. Multiple  small dense nodular opacities are seen projecting over the lower right hemithorax, similar in retrospect to the prior study, but only visualized on the frontal projection. Pulmonary vasculature and the cardiomediastinal silhouette are within normal limits. Aortic atherosclerosis. IMPRESSION: 1. No  definite radiographic evidence of acute cardiopulmonary disease. 2. Nodular densities projecting over the lower right hemithorax which are very dense, favored to be costochondral calcifications. If there is any clinical concern for pulmonary nodules, further evaluation with noncontrast chest CT could be considered. Electronically Signed   By: Vinnie Langton M.D.   On: 10/26/2017 20:06   Ct Head Wo Contrast  Result Date: 10/26/2017 CLINICAL DATA:  Altered mental status EXAM: CT HEAD WITHOUT CONTRAST TECHNIQUE: Contiguous axial images were obtained from the base of the skull through the vertex without intravenous contrast. COMPARISON:  09/06/2015 FINDINGS: Brain: Age related volume loss. No acute intracranial abnormality. Specifically, no hemorrhage, hydrocephalus, mass lesion, acute infarction, or significant intracranial injury. Vascular: No hyperdense vessel or unexpected calcification. Skull: No acute calvarial abnormality. Sinuses/Orbits: Visualized paranasal sinuses and mastoids clear. Orbital soft tissues unremarkable. Other: None IMPRESSION: No acute intracranial abnormality. Electronically Signed   By: Rolm Baptise M.D.   On: 10/26/2017 20:05    Procedures Procedures (including critical care time)  Medications Ordered in ED Medications  potassium chloride SA (K-DUR,KLOR-CON) CR tablet 40 mEq (40 mEq Oral Refused 10/26/17 2235)  albuterol (PROVENTIL HFA;VENTOLIN HFA) 108 (90 Base) MCG/ACT inhaler 2 puff (has no administration in time range)  buPROPion (WELLBUTRIN XL) 24 hr tablet 300 mg (300 mg Oral Refused 10/26/17 2233)  citalopram (CELEXA) tablet 10 mg (10 mg Oral Refused 10/26/17 2233)  furosemide (LASIX) tablet 40 mg (40 mg Oral Refused 10/26/17 2233)  lamoTRIgine (LAMICTAL) tablet 300 mg (300 mg Oral Refused 10/26/17 2233)  latanoprost (XALATAN) 0.005 % ophthalmic solution 1 drop (1 drop Both Eyes Refused 10/26/17 2236)  levothyroxine (SYNTHROID, LEVOTHROID) tablet 50 mcg (50 mcg Oral  Refused 10/26/17 2233)  metoprolol succinate (TOPROL-XL) 24 hr tablet 50 mg (50 mg Oral Refused 10/26/17 2235)  montelukast (SINGULAIR) tablet 10 mg (10 mg Oral Refused 10/26/17 2235)  oxyCODONE-acetaminophen (PERCOCET/ROXICET) 5-325 MG per tablet 1-2 tablet (2 tablets Oral Refused 10/26/17 2225)  pantoprazole (PROTONIX) EC tablet 40 mg (40 mg Oral Refused 10/26/17 2235)  tiZANidine (ZANAFLEX) tablet 4 mg (has no administration in time range)  sodium chloride 0.9 % bolus 1,000 mL ( Intravenous Stopped 10/26/17 2046)     Initial Impression / Assessment and Plan / ED Course  I have reviewed the triage vital signs and the nursing notes.  Pertinent labs & imaging results that were available during my care of the patient were reviewed by me and considered in my medical decision making (see chart for details).  Clinical Course as of Oct 26 2336  Tue Oct 26, 2017  [id number] Dr Nelva Bush PM&R 10/16/17 - Assessment Note 1. Chronic pain syndrome.  2. 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   3. Chronic T5 compression fracture.   4. Acute-on-chronic low back pain.   5. History of severe spinal stenosis at L4-5. Surgery not recommended by Dr. Charlestine Massed nor Dr. Christella Noa.   6. Pain agreement 04/19/15, 04/20/17  7. UDS 05/25/17  Plan:  4 months, rf  I am happy to hear that she completely got off of her extended release morphine. I did review her urine drug screen done May 25, 2017. It is consistent with the medicine she was on at that point in time. Refill hydrocodone.  Follow-up in 4 months. PDMP profile was reviewed. She is not doctor shopping.   1. Chronic low back pain . Norco 10 mg-325 mg tablet  Discussion Note: None recorded.  Patient educational handouts: No information available.     [MB]  1919 Son is here now.  He does not live with her but he says that this is a new change in personality for her.  Says usually she is lucid and able to make her own decisions.  He does say that she is also had a  history of bipolar before and this looks a lot like that.  He thinks she is been admitted [facility] at [facility] a couple of times.  Were in the process of her medical work-up and she is got IV fluids going.  She does not have any focal deficits.   [MB]  2313 No obvious medical examination of the patient's erratic behavior.  I placed a consult in for TTS to assist with evaluation.   [MB]    Clinical Course User Index [MB] Hayden Rasmussen, MD     Final Clinical Impressions(s) / ED Diagnoses   Final diagnoses:  Chronic back pain, unspecified back location, unspecified back pain laterality  Behavior disturbance    ED Discharge Orders    None       Hayden Rasmussen, MD 10/26/17 2340

## 2017-10-26 NOTE — ED Notes (Signed)
Heidi Fuentes- Son- [phone] -cell please call for updates

## 2017-10-26 NOTE — ED Triage Notes (Signed)
EMS reports EMS was called out for questionable altered mental status.    Reports they arrived and pt was naked when they arrived.  Reports pt's air conditioning wasn't working and pt had been naked for the past 3 or 4 days.  EMS says pt had a soda sitting beside her but says she can't remember the last time she had eaten or drank anything.  Pt lives alone.  CBG 129.  EMS says pt's eyes appear "glazed" and pupils pin point.    PT c/o back pain to ems.

## 2017-10-26 NOTE — ED Notes (Addendum)
Pt requested something for 8/10 pain so she can sleep. RN brought PRN Percocet. Pt refused it and then refused any other meds. Pt then proceeded to talk aimlessly regarding her family, unable to redirect patient back to subject of pain/medications .  Percocet wasted with Demaris Callander. EDP notified

## 2017-10-27 ENCOUNTER — Other Ambulatory Visit (HOSPITAL_COMMUNITY): Payer: Medicare Other

## 2017-10-27 ENCOUNTER — Ambulatory Visit (HOSPITAL_COMMUNITY): Payer: Medicare Other

## 2017-10-27 DIAGNOSIS — F3113 Bipolar disorder, current episode manic without psychotic features, severe: Secondary | ICD-10-CM | POA: Diagnosis not present

## 2017-10-27 MED ORDER — LORAZEPAM 2 MG/ML IJ SOLN
1.0000 mg | Freq: Once | INTRAMUSCULAR | Status: AC
  Administered 2017-10-27: 1 mg via INTRAMUSCULAR
  Filled 2017-10-27: qty 1

## 2017-10-27 MED ORDER — LORAZEPAM 1 MG PO TABS
1.0000 mg | ORAL_TABLET | ORAL | Status: DC | PRN
  Administered 2017-10-29: 1 mg via ORAL
  Filled 2017-10-27 (×2): qty 1

## 2017-10-27 NOTE — ED Notes (Signed)
Pt stating she is wanting to leave. Notified EPD of such. IVC process started.

## 2017-10-27 NOTE — ED Notes (Signed)
Pt states she needs something "strong" for her back pain and would like something to "knock her out." RN notified.

## 2017-10-27 NOTE — ED Notes (Addendum)
Pt given Ginger Ale at this time. Pt states she needs to call a ride home. Pt has been told by [employer] and staff that she can't go anywhere. Pt states that she needs to go home she has a special bed for her back. AC called switching pt to a hospital bed to try and help with comfort.

## 2017-10-27 NOTE — ED Notes (Signed)
Updated philip on plan of care

## 2017-10-27 NOTE — ED Notes (Signed)
Pt has been awake and talking the entire shift. Refused any po meds.

## 2017-10-27 NOTE — ED Notes (Signed)
Officer in room

## 2017-10-27 NOTE — Progress Notes (Signed)
TTS met with patient for re-assessment. Patient was oriented to self and place but not time or situation. She asked sitter to answer questions for her through out assessment. Clinician asserted to patient she wanted to hear from her. Patient refused to answer questions and said "I can take care of myself, you don't have to ask me these stupid questions." According to nurse, Clarene Critchley, patient did not sleep at all that night and was up talking. Patient's affect was euphoric yet, irritable. Her affect was congruent. Her insight, judgement, and impulse control continue to be poor. She denies SI/HI/AVH.

## 2017-10-27 NOTE — Progress Notes (Signed)
Pt. meets criteria for inpatient treatment per Emerson Monte.  Referred out to the following hospitals: [facility]  [facility]    Disposition CSW will continue to follow for placement.  Areatha Keas. Judi Cong, MSW, [facility] Clinical Social Work [phone] (cell) [phone] (office)

## 2017-10-27 NOTE — ED Notes (Signed)
Room stripped

## 2017-10-27 NOTE — ED Provider Notes (Signed)
I assumed care in sign out.  Patient apparently presents from home after not taking care of herself, and showing signs of mania.  Per family, patient has had a history of manic episodes requiring admission. She request to leave, but after discussion with previous physician Dr. Melina Copa, patient is to be admitted to a psychiatric facility.  Decision was made to complete IVC as she is a threat to her safety at home.  Patient will now stay in the emergency department.  I reviewed labs and imaging results.  She will need follow-up on possible pulmonary nodules on her chest x-ray.  This can be done as an outpatient.  Patient awaiting dispel at this time   Ripley Fraise, MD 10/27/17 0030

## 2017-10-27 NOTE — BH Assessment (Addendum)
Tele Assessment Note   Patient Name: Heidi Fuentes MRN: [id number] Referring Physician: Dr. Aletta Edouard, MD Location of [facility]: [facility] Location of Provider: [place] Heidi Fuentes is an 72 y.o. female who was brought to [facility] due to pt calling and requesting to be evaluated for back pain. EMS expressed concerns regarding pt's mental status, as pt was found naked in her home, where she had been for several days. It is reported that pt was unable to report when she last ate or drank, so pt was brought to the ED. Throughout the [facility] Assessment, pt deflects questions, telling clinician to look up information in her chart, stating that she knows the answer to the question posed/information but that she isn't going to tell clinician, etc. Several times it appeared as if pt was going to provide the information clinician requested but she could not recall it and expressed getting agitated and/or upset.   Clinician inquired as to why pt was at the hospital ad pt stated that she has seen many different doctors and that no one is able to figure out why she is experiencing the pain she does. Pt states she doesn't want medication, she wants to figure out what is wrong. Pt states she is no longer going to take any more medication nor is she going to undergo any more testing. Clinician inquired as to whether pt was having thoughts of hurting or killing herself, and pt stated that she did, yes, if she would have to go on living in this manner.  Pt stated she was no longer going to answer clinician's questions, as she "knows what [I'm} trying to do." Clinician reinforced that getting information from pt as opposed to getting it from records was preferred, as clinician cares more about what pt has to say and how she is thinking/feeling than what others think pt is thinking/feeling. Pt expressed an appreciation of this but was still unwilling/unable to directly answer clinician's  questions, including but not limited to when her husband died, where her children live, what today's date is, what month it is, what year it is, how she got to the hospital, etc.  Pt was not oriented, though she did know her name and DOB. Pt's memory recall was questionable. Pt was overall pleasant throughout the assessment, though she became agitated easily, which caused her head to hurt or her to spasm. Pt's insight, judgement, and impulse control is impaired at this time.   Diagnosis: F31.13. Bipolar I disorder, Current or most recent episode manic, Severe   Past Medical History:  Past Medical History:  Diagnosis Date  . Arthritis   . Asthma   . Back pain   . Chronic pain syndrome 07/10/2013  . Depression   . Duodenal papillary stenosis   . Dyspnea   . Esophageal dysmotility   . Fibromyalgia   . GERD (gastroesophageal reflux disease)   . Glaucoma   . H/O wheezing   . History of hiatal hernia   . History of palpitations   . HOH (hard of hearing)   . HOH (hard of hearing)   . Hypertension   . Hypothyroidism   . Macular degeneration   . Orthopnea   . Stenosis, spinal, lumbar   . Thyroid disease   . Tubular adenoma   . 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[facility])     Past Surgical History:  Procedure Laterality Date  . CATARACT EXTRACTION W/PHACO Right 10/06/2016   Procedure: CATARACT EXTRACTION PHACO  AND INTRAOCULAR LENS PLACEMENT (IOC);  Surgeon: Birder Robson, MD;  Location: [facility] ORS;  Service: Ophthalmology;  Laterality: Right;  [place] 00:32.5 AP% 14.5 CDE 4.71 Fluid pack lot # [id number]  . CATARACT EXTRACTION W/PHACO Left 11/03/2016   Procedure: CATARACT EXTRACTION PHACO AND INTRAOCULAR LENS PLACEMENT (IOC);  Surgeon: Birder Robson, MD;  Location: [facility] ORS;  Service: Ophthalmology;  Laterality: Left;  [place] 00:36.6 AP% 16.0 CDE 5.86 Fluid Pack lot # [id number]  . COCCYX REMOVAL    . colonoscopy  2005   Dr. Laural Golden: mild melanosis coli, otherwise normal  . COLONOSCOPY WITH  PROPOFOL N/A 09/14/2013   UXN:ATFTDDUKG coli. Colonic diverticulosis. Single colonic. Tubular adenoma. Next TCS 09/2020.  [name] ERCP  1997   [facility]: biliary manometry abnormal, subsequent sphincterotomy   . ESOPHAGEAL MANOMETRY N/A 12/21/2016   Procedure: ESOPHAGEAL MANOMETRY (EM);  Surgeon: Mauri Pole, MD;  Location: [facility];  Service: Endoscopy;  Laterality: N/A;  . ESOPHAGOGASTRODUODENOSCOPY (EGD) WITH PROPOFOL N/A 09/14/2013   URK:[id number]'E ring. Hiatal hernia. Status post Venia Minks and biopsy disruption.   [name] FOOT SURGERY     X2  . HEMORROIDECTOMY    . HERNIA REPAIR     inguinal right  . [place] N/A 09/14/2013   Procedure: Venia Minks DILATION;  Surgeon: [name], MD;  Location: [facility];  Service: Endoscopy;  Laterality: N/A;  56  . POLYPECTOMY N/A 09/14/2013   Procedure: POLYPECTOMY;  Surgeon: [name], MD;  Location: [facility];  Service: Endoscopy;  Laterality: N/A;  . TONSILLECTOMY      Family History:  Family History  Problem Relation Age of Onset  . Lung cancer Mother   . Heart attack Father   . Bone cancer Brother   . COPD Sister   . Colon cancer Neg Hx     Social History:  reports that she has never smoked. She has never used smokeless tobacco. She reports that she does not drink alcohol or use drugs.  Additional Social History:  Alcohol / Drug Use Pain Medications: Please see MAR Prescriptions: Please see MAR Over the Counter: Please see MAR History of alcohol / drug use?: (UTA - pt is unwilling/unable to participate in assessment) Longest period of sobriety (when/how long): UTA - pt is unwilling/unable to participate in assessment  CIWA: CIWA-Ar BP: (!) 174/86 Pulse Rate: (!) 103 COWS:    Allergies:  Allergies  Allergen Reactions  . Abilify [Aripiprazole]   . Geodon [Ziprasidone Hcl]   . Trazodone And Nefazodone   . Sulfa Antibiotics Rash    Home Medications:  (Not in a hospital admission)  OB/GYN Status:  No LMP  recorded. Patient is postmenopausal.  General Assessment Data TTS Assessment: In system Is this a Tele or Face-to-Face Assessment?: Tele Assessment Is this an Initial Assessment or a Re-assessment for this encounter?: Initial Assessment Patient Accompanied by:: N/A Language Other than English: No Living Arrangements: Other (Comment)(Pt lives in a house, unsure if she lives independently) What gender do you identify as?: Female Marital status: (UTA - pt is unwilling/unable to participate in assessment) Maiden name: UTA - pt is unwilling/unable to participate in assessment Pregnancy Status: No Living Arrangements: Other (Comment)(UTA - pt is unwilling/unable to participate in assessment) Can pt return to current living arrangement?: (UTA - pt is unwilling/unable to participate in assessment) Admission Status: Voluntary Is patient capable of signing voluntary admission?: Yes Referral Source: Self/Family/Friend Insurance type: Medicare     Crisis Care Plan Living Arrangements: Other (  Comment)(UTA - pt is unwilling/unable to participate in assessment) Legal Guardian: Other:(N/A) Name of Psychiatrist: UTA - pt is unwilling/unable to participate in assessment Name of Therapist: UTA - pt is unwilling/unable to participate in assessment  Education Status Is patient currently in school?: No Is the patient employed, unemployed or receiving disability?: (UTA - pt is unwilling/unable to participate in assessment)  Risk to self with the past 6 months Suicidal Ideation: Yes-Currently Present Has patient been a risk to self within the past 6 months prior to admission? : Yes Suicidal Intent: Yes-Currently Present Has patient had any suicidal intent within the past 6 months prior to admission? : Yes Is patient at risk for suicide?: Yes Suicidal Plan?: (UTA - pt is unwilling/unable to participate in assessment) Has patient had any suicidal plan within the past 6 months prior to admission? : (UTA  - pt is unwilling/unable to participate in assessment) Access to Means: (UTA - pt is unwilling/unable to participate in assessment) What has been your use of drugs/alcohol within the last 12 months?: UTA - pt is unwilling/unable to participate in assessment Previous Attempts/Gestures: (UTA - pt is unwilling/unable to participate in assessment) How many times?: (UTA - pt is unwilling/unable to participate in assessment) Other Self Harm Risks: UTA - pt is unwilling/unable to participate in assessment Triggers for Past Attempts: Unknown Intentional Self Injurious Behavior: (UTA - pt is unwilling/unable to participate in assessment) Family Suicide History: Unable to assess Recent stressful life event(s): Recent negative physical changes, Trauma (Comment)(Pt has been in pain & unable to identify why/what is wrong) Persecutory voices/beliefs?: (UTA - pt is unwilling/unable to participate in assessment) Depression: Yes Depression Symptoms: Fatigue, Loss of interest in usual pleasures, Feeling worthless/self pity, Feeling angry/irritable Substance abuse history and/or treatment for substance abuse?: (UTA - pt is unwilling/unable to participate in assessment) Suicide prevention information given to non-admitted patients: Not applicable  Risk to Others within the past 6 months Homicidal Ideation: (UTA - pt is unwilling/unable to participate in assessment) Does patient have any lifetime risk of violence toward others beyond the six months prior to admission? : (UTA - pt is unwilling/unable to participate in assessment) Thoughts of Harm to Others: (UTA - pt is unwilling/unable to participate in assessment) Current Homicidal Intent: (UTA - pt is unwilling/unable to participate in assessment) Current Homicidal Plan: (UTA - pt is unwilling/unable to participate in assessment) Access to Homicidal Means: (UTA - pt is unwilling/unable to participate in assessment) Identified Victim: UTA - pt is unwilling/unable  to participate in assessment History of harm to others?: (UTA - pt is unwilling/unable to participate in assessment) Assessment of Violence: On admission Violent Behavior Description: UTA - pt is unwilling/unable to participate in assessment Does patient have access to weapons?: (UTA - pt is unwilling/unable to participate in assessment) Criminal Charges Pending?: (UTA - pt is unwilling/unable to participate in assessment) Does patient have a court date: (UTA - pt is unwilling/unable to participate in assessment) Is patient on probation?: Unknown  Psychosis Hallucinations: None noted(UTA - pt is unwilling/unable to participate in assessment) Delusions: None noted(UTA - pt is unwilling/unable to participate in assessment)  Mental Status Report Appearance/Hygiene: Bizarre, In scrubs Eye Contact: Fair Motor Activity: Restlessness, Other (Comment)(Appeared to be experiencing spasms throughout the assessment) Speech: Tangential Level of Consciousness: Alert Mood: Suspicious, Apprehensive Affect: Apprehensive, Appropriate to circumstance Anxiety Level: Minimal Thought Processes: Circumstantial Judgement: Partial Orientation: Unable to assess(UTA - pt is unwilling/unable to participate in assessment) Obsessive Compulsive Thoughts/Behaviors: Moderate  Cognitive Functioning Concentration: Fair  Memory: Unable to Assess Is patient IDD: No Insight: Poor Impulse Control: Fair Appetite: (UTA - pt is unwilling/unable to participate in assessment) Have you had any weight changes? : (UTA - pt is unwilling/unable to participate in assessment) Sleep: Unable to Assess Total Hours of Sleep: (UTA - pt is unwilling/unable to participate in assessment) Vegetative Symptoms: Unable to Assess  ADLScreening [facility] Assessment Services) Patient's cognitive ability adequate to safely complete daily activities?: (UTA - pt is unwilling/unable to participate in assessment) Patient able to express need for  assistance with ADLs?: (UTA - pt is unwilling/unable to participate in assessment) Independently performs ADLs?: (UTA - pt is unwilling/unable to participate in assessment)  Prior Inpatient Therapy Prior Inpatient Therapy: (UTA - pt is unwilling/unable to participate in assessment)  Prior Outpatient Therapy Prior Outpatient Therapy: (UTA - pt is unwilling/unable to participate in assessment)  ADL Screening (condition at time of admission) Patient's cognitive ability adequate to safely complete daily activities?: (UTA - pt is unwilling/unable to participate in assessment) Is the patient deaf or have difficulty hearing?: (UTA - pt is unwilling/unable to participate in assessment) Does the patient have difficulty seeing, even when wearing glasses/contacts?: (UTA - pt is unwilling/unable to participate in assessment) Does the patient have difficulty concentrating, remembering, or making decisions?: (UTA - pt is unwilling/unable to participate in assessment) Patient able to express need for assistance with ADLs?: (UTA - pt is unwilling/unable to participate in assessment) Does the patient have difficulty dressing or bathing?: (UTA - pt is unwilling/unable to participate in assessment) Independently performs ADLs?: (UTA - pt is unwilling/unable to participate in assessment) Does the patient have difficulty walking or climbing stairs?: (UTA - pt is unwilling/unable to participate in assessment) Weakness of Legs: (UTA - pt is unwilling/unable to participate in assessment) Weakness of Arms/Hands: (UTA - pt is unwilling/unable to participate in assessment)     Therapy Consults (therapy consults require a physician order) PT Evaluation Needed: (UTA - pt is unwilling/unable to participate in assessment) OT Evalulation Needed: (UTA - pt is unwilling/unable to participate in assessment) SLP Evaluation Needed: (UTA - pt is unwilling/unable to participate in assessment) Abuse/Neglect Assessment  (Assessment to be complete while patient is alone) Abuse/Neglect Assessment Can Be Completed: Unable to assess, patient is non-responsive or altered mental status Values / Beliefs Cultural Requests During Hospitalization: (UTA - pt is unwilling/unable to participate in assessment) Spiritual Requests During Hospitalization: (UTA - pt is unwilling/unable to participate in assessment) Consults Spiritual Care Consult Needed: (UTA - pt is unwilling/unable to participate in assessment) Social Work Consult Needed: (UTA - pt is unwilling/unable to participate in assessment) Advance Directives (For Healthcare) Does Patient Have a Medical Advance Directive?: (UTA - pt is unwilling/unable to participate in assessment) Type of Advance Directive: (UTA - pt is unwilling/unable to participate in assessment)       Disposition:  Disposition Initial Assessment Completed for this Encounter: Yes Patient referred to: Other (Comment)(Pt will be referred out to [facility])  This service was provided via telemedicine using a 2-way, interactive audio and video technology.  Names of all persons participating in this telemedicine service and their role in this encounter. Name: Leslie Dales Role: Patient  Name: Windell Hummingbird Role: Clinician    Dannielle Burn 10/27/2017 12:14 AM

## 2017-10-28 ENCOUNTER — Encounter (HOSPITAL_COMMUNITY): Payer: Self-pay | Admitting: Registered Nurse

## 2017-10-28 DIAGNOSIS — F3113 Bipolar disorder, current episode manic without psychotic features, severe: Secondary | ICD-10-CM | POA: Diagnosis not present

## 2017-10-28 NOTE — ED Notes (Signed)
Pt ambulated to [facility] without difficultly

## 2017-10-28 NOTE — ED Notes (Signed)
Patient's son, Doren Custard, visiting today. Patient would like to be notified when patient is moved.

## 2017-10-28 NOTE — ED Notes (Signed)
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 I told patient what her medication was for and she said that it was poisoning her and causing her problems.  Patient states that she would take her synthroid medication.

## 2017-10-28 NOTE — ED Notes (Signed)
Pt to be reassessed by [facility]

## 2017-10-28 NOTE — Progress Notes (Signed)
Re-Assessment: Patient is alert and oriented x4. Mood is euthymic and affect is congruent. Her thoughts are logical and coherent. Patient stated that she slept the previous night after being awake all night the night before. Patient denied SI/HI/ AVH. Patient reported that she declined to take her medications with the exception of her synthroid medication because "she doesn't want to end up back in the hospital."

## 2017-10-29 ENCOUNTER — Other Ambulatory Visit: Payer: Self-pay

## 2017-10-29 DIAGNOSIS — Z79899 Other long term (current) drug therapy: Secondary | ICD-10-CM | POA: Diagnosis not present

## 2017-10-29 DIAGNOSIS — E039 Hypothyroidism, unspecified: Secondary | ICD-10-CM | POA: Diagnosis not present

## 2017-10-29 DIAGNOSIS — Z882 Allergy status to sulfonamides status: Secondary | ICD-10-CM | POA: Diagnosis not present

## 2017-10-29 DIAGNOSIS — J9811 Atelectasis: Secondary | ICD-10-CM | POA: Diagnosis not present

## 2017-10-29 DIAGNOSIS — M545 Low back pain: Secondary | ICD-10-CM | POA: Diagnosis not present

## 2017-10-29 DIAGNOSIS — E876 Hypokalemia: Secondary | ICD-10-CM | POA: Diagnosis not present

## 2017-10-29 DIAGNOSIS — M5440 Lumbago with sciatica, unspecified side: Secondary | ICD-10-CM | POA: Diagnosis not present

## 2017-10-29 DIAGNOSIS — B372 Candidiasis of skin and nail: Secondary | ICD-10-CM | POA: Diagnosis not present

## 2017-10-29 DIAGNOSIS — R918 Other nonspecific abnormal finding of lung field: Secondary | ICD-10-CM | POA: Diagnosis not present

## 2017-10-29 DIAGNOSIS — I10 Essential (primary) hypertension: Secondary | ICD-10-CM | POA: Diagnosis not present

## 2017-10-29 DIAGNOSIS — S2241XA Multiple fractures of ribs, right side, initial encounter for closed fracture: Secondary | ICD-10-CM | POA: Diagnosis not present

## 2017-10-29 DIAGNOSIS — G894 Chronic pain syndrome: Secondary | ICD-10-CM | POA: Diagnosis not present

## 2017-10-29 DIAGNOSIS — F3113 Bipolar disorder, current episode manic without psychotic features, severe: Secondary | ICD-10-CM | POA: Diagnosis not present

## 2017-10-29 DIAGNOSIS — M797 Fibromyalgia: Secondary | ICD-10-CM | POA: Diagnosis not present

## 2017-10-29 DIAGNOSIS — J984 Other disorders of lung: Secondary | ICD-10-CM | POA: Diagnosis not present

## 2017-10-29 DIAGNOSIS — J206 Acute bronchitis due to rhinovirus: Secondary | ICD-10-CM | POA: Diagnosis not present

## 2017-10-29 DIAGNOSIS — J45909 Unspecified asthma, uncomplicated: Secondary | ICD-10-CM | POA: Diagnosis not present

## 2017-10-29 DIAGNOSIS — F319 Bipolar disorder, unspecified: Secondary | ICD-10-CM | POA: Diagnosis not present

## 2017-10-29 DIAGNOSIS — J309 Allergic rhinitis, unspecified: Secondary | ICD-10-CM | POA: Diagnosis not present

## 2017-10-29 DIAGNOSIS — K5900 Constipation, unspecified: Secondary | ICD-10-CM | POA: Diagnosis not present

## 2017-10-29 DIAGNOSIS — D509 Iron deficiency anemia, unspecified: Secondary | ICD-10-CM | POA: Diagnosis not present

## 2017-10-29 DIAGNOSIS — Z9119 Patient's noncompliance with other medical treatment and regimen: Secondary | ICD-10-CM | POA: Diagnosis not present

## 2017-10-29 DIAGNOSIS — J45901 Unspecified asthma with (acute) exacerbation: Secondary | ICD-10-CM | POA: Diagnosis not present

## 2017-10-29 DIAGNOSIS — H409 Unspecified glaucoma: Secondary | ICD-10-CM | POA: Diagnosis not present

## 2017-10-29 DIAGNOSIS — H4089 Other specified glaucoma: Secondary | ICD-10-CM | POA: Diagnosis not present

## 2017-10-29 DIAGNOSIS — K219 Gastro-esophageal reflux disease without esophagitis: Secondary | ICD-10-CM | POA: Diagnosis not present

## 2017-10-29 DIAGNOSIS — C3490 Malignant neoplasm of unspecified part of unspecified bronchus or lung: Secondary | ICD-10-CM | POA: Diagnosis not present

## 2017-10-29 DIAGNOSIS — J4521 Mild intermittent asthma with (acute) exacerbation: Secondary | ICD-10-CM | POA: Diagnosis not present

## 2017-10-29 DIAGNOSIS — F3112 Bipolar disorder, current episode manic without psychotic features, moderate: Secondary | ICD-10-CM | POA: Diagnosis not present

## 2017-10-29 DIAGNOSIS — R45851 Suicidal ideations: Secondary | ICD-10-CM | POA: Diagnosis not present

## 2017-10-29 DIAGNOSIS — J310 Chronic rhinitis: Secondary | ICD-10-CM | POA: Diagnosis not present

## 2017-10-29 DIAGNOSIS — G8929 Other chronic pain: Secondary | ICD-10-CM | POA: Diagnosis not present

## 2017-10-29 DIAGNOSIS — R911 Solitary pulmonary nodule: Secondary | ICD-10-CM | POA: Diagnosis not present

## 2017-10-29 DIAGNOSIS — K5909 Other constipation: Secondary | ICD-10-CM | POA: Diagnosis not present

## 2017-10-29 DIAGNOSIS — R05 Cough: Secondary | ICD-10-CM | POA: Diagnosis not present

## 2017-10-29 DIAGNOSIS — Z888 Allergy status to other drugs, medicaments and biological substances status: Secondary | ICD-10-CM | POA: Diagnosis not present

## 2017-10-29 LAB — COMPREHENSIVE METABOLIC PANEL
ALK PHOS: 57 U/L (ref 38–126)
ALT: 33 U/L (ref 0–44)
ANION GAP: 8 (ref 5–15)
AST: 31 U/L (ref 15–41)
Albumin: 3.9 g/dL (ref 3.5–5.0)
BILIRUBIN TOTAL: 0.6 mg/dL (ref 0.3–1.2)
BUN: 14 mg/dL (ref 8–23)
CALCIUM: 8.9 mg/dL (ref 8.9–10.3)
CO2: 28 mmol/L (ref 22–32)
Chloride: 102 mmol/L (ref 98–111)
Creatinine, Ser: 0.71 mg/dL (ref 0.44–1.00)
GFR calc Af Amer: >60 mL/min (ref >60)
Glucose, Bld: 106 mg/dL — ABNORMAL HIGH (ref 70–99)
Potassium: 3.3 mmol/L — ABNORMAL LOW (ref 3.5–5.1)
Sodium: 138 mmol/L (ref 135–145)
TOTAL PROTEIN: 6.6 g/dL (ref 6.5–8.1)

## 2017-10-29 LAB — CBC WITH DIFFERENTIAL/PLATELET
Basophils Absolute: 0 10*3/uL (ref 0.0–0.1)
Basophils Relative: 0 %
Eosinophils Absolute: 0.7 10*3/uL (ref 0.0–0.7)
Eosinophils Relative: 10 %
HEMATOCRIT: 44.4 % (ref 36.0–46.0)
Hemoglobin: 14.4 g/dL (ref 12.0–15.0)
LYMPHS ABS: 2 10*3/uL (ref 0.7–4.0)
LYMPHS PCT: 29 %
MCH: 31.1 pg (ref 26.0–34.0)
MCHC: 32.4 g/dL (ref 30.0–36.0)
MCV: 95.9 fL (ref 78.0–100.0)
MONO ABS: 0.7 10*3/uL (ref 0.1–1.0)
MONOS PCT: 10 %
NEUTROS ABS: 3.5 10*3/uL (ref 1.7–7.7)
Neutrophils Relative %: 51 %
Platelets: 257 10*3/uL (ref 150–400)
RBC: 4.63 MIL/uL (ref 3.87–5.11)
RDW: 13.1 % (ref 11.5–15.5)
WBC: 6.9 10*3/uL (ref 4.0–10.5)

## 2017-10-29 NOTE — ED Notes (Signed)
Pt called nursing staff into room, pt states " I am not going anywhere, I need my son in [place]" RN explained to pt that she was under involuntary commitment and would be transported to [facility], pt states " I am suing this hospital and you"

## 2017-10-29 NOTE — ED Notes (Signed)
Pt refusing ativan, states " I am not taking another pill, that is why I am here" pt offered water as well and refused both, sitter remains at bedside,

## 2017-10-29 NOTE — Progress Notes (Signed)
Pt accepted to [facility]   Dr. Geanie Kenning, MD is the accepting/attending provider.  Call report to [phone] [email or web address] notified.   Pt may be transported by [facility], Melissa to call when bed is available.  [employer] Areatha Keas, MSW, [place] Disposition Clinical Social Work [phone] (cell) [phone] (office)

## 2017-10-29 NOTE — Progress Notes (Signed)
CSW asked to fax over EKG to [facility].  Heidi Fuentes. Judi Cong, MSW, [place] Disposition Clinical Social Work [phone] (cell) [phone] (office)

## 2017-10-29 NOTE — BH Assessment (Signed)
Received call from Amy at [facility] requesting IVC paperwork, vital signs, UDS and BAL. Faxed information to [phone].   Orpah Greek Anson Fret, LPC, [facility], [facility] Triage Specialist [phone]

## 2017-10-29 NOTE — ED Notes (Signed)
Unidentified caller called and asked for update Informed that pt still being evaluated

## 2017-10-29 NOTE — Progress Notes (Signed)
CSW faxed over updated labs and vitals to [facility]..  Areatha Keas. Judi Cong, MSW, [place] Disposition Clinical Social Work [phone] (cell) [phone] (office)

## 2017-10-29 NOTE — ED Notes (Signed)
Pt's son phillip Rissler notified of pt's discharge,

## 2017-10-29 NOTE — BH Assessment (Signed)
[place] Assessment Progress Note     Patient was seen by TTS for reassessment.  Patient was cooperative during the assessment process and she was pleasant and humorous.  Patient is able to identify the reasons as to why she is in the hospital.  She states that her air-conditioning was not working in her home and she was struggling with respiratory issues. She states that she got so hot that she was wheezing.  She states that she was naked in her home with fans running when EMS arrived.  Patient states that her son had sent SMS there to do a welfare check.  Patient denies current SI/HI/Psychosis.  Patient was a little tangential during her re-assessment and TTS spoke to her nurse who states that patient has been a little loose in her thought associations  And has been manic at times.  Due to her mania, TTS/Social Work will continue to seek placement for patient.

## 2017-10-29 NOTE — ED Notes (Signed)
Ford, from [facility] called RN to update status on Pt's placement.  Heidi Fuentes is interested in accepting paperwork and are requesting a copy of IVC paperwork be faxed to [facility].  IVC paperwork has been sent to [facility] per their request, and RN is awaiting further updates on Pts placement.

## 2017-10-29 NOTE — ED Notes (Signed)
Pt standing on door of room, stating :" I am not going anywhere and becoming arguing  with staff" sitter and security remain at bedside,

## 2017-10-29 NOTE — ED Provider Notes (Signed)
Pt has been accepted to [facility] for inpatient psych by Dr. Ronnald Ramp.  Pt remains stable for transfer.   Isla Pence, MD 10/29/17 2013

## 2017-10-29 NOTE — ED Notes (Signed)
[facility] at bedside for transport,

## 2017-10-30 DIAGNOSIS — K219 Gastro-esophageal reflux disease without esophagitis: Secondary | ICD-10-CM | POA: Diagnosis present

## 2017-10-30 DIAGNOSIS — J45909 Unspecified asthma, uncomplicated: Secondary | ICD-10-CM

## 2017-10-30 DIAGNOSIS — H4089 Other specified glaucoma: Secondary | ICD-10-CM | POA: Diagnosis present

## 2017-10-30 DIAGNOSIS — J310 Chronic rhinitis: Secondary | ICD-10-CM | POA: Insufficient documentation

## 2017-11-02 DIAGNOSIS — R059 Cough, unspecified: Secondary | ICD-10-CM | POA: Insufficient documentation

## 2017-11-29 DIAGNOSIS — E7800 Pure hypercholesterolemia, unspecified: Secondary | ICD-10-CM | POA: Diagnosis not present

## 2017-11-29 DIAGNOSIS — I10 Essential (primary) hypertension: Secondary | ICD-10-CM | POA: Diagnosis not present

## 2017-11-29 DIAGNOSIS — M810 Age-related osteoporosis without current pathological fracture: Secondary | ICD-10-CM | POA: Diagnosis not present

## 2017-11-29 DIAGNOSIS — F3132 Bipolar disorder, current episode depressed, moderate: Secondary | ICD-10-CM | POA: Diagnosis not present

## 2017-12-02 DIAGNOSIS — F4325 Adjustment disorder with mixed disturbance of emotions and conduct: Secondary | ICD-10-CM | POA: Diagnosis not present

## 2017-12-03 DIAGNOSIS — H353222 Exudative age-related macular degeneration, left eye, with inactive choroidal neovascularization: Secondary | ICD-10-CM | POA: Diagnosis not present

## 2017-12-08 DIAGNOSIS — F319 Bipolar disorder, unspecified: Secondary | ICD-10-CM | POA: Diagnosis not present

## 2018-01-03 DIAGNOSIS — I10 Essential (primary) hypertension: Secondary | ICD-10-CM | POA: Diagnosis not present

## 2018-01-03 DIAGNOSIS — J45909 Unspecified asthma, uncomplicated: Secondary | ICD-10-CM | POA: Diagnosis not present

## 2018-01-03 DIAGNOSIS — F3132 Bipolar disorder, current episode depressed, moderate: Secondary | ICD-10-CM | POA: Diagnosis not present

## 2018-01-03 DIAGNOSIS — E559 Vitamin D deficiency, unspecified: Secondary | ICD-10-CM | POA: Diagnosis not present

## 2018-01-03 DIAGNOSIS — Z23 Encounter for immunization: Secondary | ICD-10-CM | POA: Diagnosis not present

## 2018-01-07 ENCOUNTER — Ambulatory Visit
Admission: RE | Admit: 2018-01-07 | Discharge: 2018-01-07 | Disposition: A | Discharge status: Home / Self Care | Payer: Medicare Other | Source: Ambulatory Visit | Attending: Ophthalmology | Admitting: Ophthalmology

## 2018-01-07 ENCOUNTER — Other Ambulatory Visit: Payer: Self-pay | Admitting: Ophthalmology

## 2018-01-07 DIAGNOSIS — R42 Dizziness and giddiness: Secondary | ICD-10-CM | POA: Diagnosis not present

## 2018-01-07 DIAGNOSIS — S0232XA Fracture of orbital floor, left side, initial encounter for closed fracture: Secondary | ICD-10-CM | POA: Insufficient documentation

## 2018-01-07 DIAGNOSIS — R51 Headache: Secondary | ICD-10-CM | POA: Diagnosis not present

## 2018-01-07 DIAGNOSIS — S0993XA Unspecified injury of face, initial encounter: Secondary | ICD-10-CM | POA: Diagnosis not present

## 2018-01-07 DIAGNOSIS — S0990XA Unspecified injury of head, initial encounter: Secondary | ICD-10-CM | POA: Diagnosis not present

## 2018-01-10 DIAGNOSIS — S0232XD Fracture of orbital floor, left side, subsequent encounter for fracture with routine healing: Secondary | ICD-10-CM | POA: Diagnosis not present

## 2018-01-20 DIAGNOSIS — H353222 Exudative age-related macular degeneration, left eye, with inactive choroidal neovascularization: Secondary | ICD-10-CM | POA: Diagnosis not present

## 2018-02-03 ENCOUNTER — Inpatient Hospital Stay (HOSPITAL_COMMUNITY)
Admission: EM | Admit: 2018-02-03 | Discharge: 2018-02-05 | DRG: 193 | Disposition: A | Discharge status: Other Acute Inpatient Hospital | Payer: Medicare Other | Attending: Internal Medicine | Admitting: Internal Medicine

## 2018-02-03 ENCOUNTER — Encounter (HOSPITAL_COMMUNITY): Payer: Self-pay | Admitting: Emergency Medicine

## 2018-02-03 ENCOUNTER — Inpatient Hospital Stay (HOSPITAL_COMMUNITY): Payer: Medicare Other

## 2018-02-03 ENCOUNTER — Other Ambulatory Visit: Payer: Self-pay

## 2018-02-03 ENCOUNTER — Emergency Department (HOSPITAL_COMMUNITY): Payer: Medicare Other

## 2018-02-03 DIAGNOSIS — Z79899 Other long term (current) drug therapy: Secondary | ICD-10-CM

## 2018-02-03 DIAGNOSIS — M1990 Unspecified osteoarthritis, unspecified site: Secondary | ICD-10-CM | POA: Diagnosis present

## 2018-02-03 DIAGNOSIS — F319 Bipolar disorder, unspecified: Secondary | ICD-10-CM | POA: Diagnosis present

## 2018-02-03 DIAGNOSIS — Z882 Allergy status to sulfonamides status: Secondary | ICD-10-CM

## 2018-02-03 DIAGNOSIS — J181 Lobar pneumonia, unspecified organism: Secondary | ICD-10-CM | POA: Diagnosis present

## 2018-02-03 DIAGNOSIS — D539 Nutritional anemia, unspecified: Secondary | ICD-10-CM | POA: Diagnosis present

## 2018-02-03 DIAGNOSIS — E785 Hyperlipidemia, unspecified: Secondary | ICD-10-CM | POA: Diagnosis present

## 2018-02-03 DIAGNOSIS — N179 Acute kidney failure, unspecified: Secondary | ICD-10-CM | POA: Diagnosis present

## 2018-02-03 DIAGNOSIS — J811 Chronic pulmonary edema: Secondary | ICD-10-CM | POA: Diagnosis not present

## 2018-02-03 DIAGNOSIS — Z9841 Cataract extraction status, right eye: Secondary | ICD-10-CM

## 2018-02-03 DIAGNOSIS — K5900 Constipation, unspecified: Secondary | ICD-10-CM | POA: Diagnosis present

## 2018-02-03 DIAGNOSIS — E039 Hypothyroidism, unspecified: Secondary | ICD-10-CM | POA: Diagnosis present

## 2018-02-03 DIAGNOSIS — J90 Pleural effusion, not elsewhere classified: Secondary | ICD-10-CM | POA: Diagnosis present

## 2018-02-03 DIAGNOSIS — Z79890 Hormone replacement therapy (postmenopausal): Secondary | ICD-10-CM

## 2018-02-03 DIAGNOSIS — Z79891 Long term (current) use of opiate analgesic: Secondary | ICD-10-CM

## 2018-02-03 DIAGNOSIS — E8770 Fluid overload, unspecified: Secondary | ICD-10-CM | POA: Diagnosis not present

## 2018-02-03 DIAGNOSIS — F329 Major depressive disorder, single episode, unspecified: Secondary | ICD-10-CM | POA: Diagnosis present

## 2018-02-03 DIAGNOSIS — K219 Gastro-esophageal reflux disease without esophagitis: Secondary | ICD-10-CM | POA: Diagnosis present

## 2018-02-03 DIAGNOSIS — R000 Tachycardia, unspecified: Secondary | ICD-10-CM | POA: Diagnosis not present

## 2018-02-03 DIAGNOSIS — J9601 Acute respiratory failure with hypoxia: Secondary | ICD-10-CM | POA: Diagnosis present

## 2018-02-03 DIAGNOSIS — H9190 Unspecified hearing loss, unspecified ear: Secondary | ICD-10-CM | POA: Diagnosis present

## 2018-02-03 DIAGNOSIS — J45909 Unspecified asthma, uncomplicated: Secondary | ICD-10-CM | POA: Diagnosis not present

## 2018-02-03 DIAGNOSIS — Z961 Presence of intraocular lens: Secondary | ICD-10-CM | POA: Diagnosis present

## 2018-02-03 DIAGNOSIS — R296 Repeated falls: Secondary | ICD-10-CM | POA: Diagnosis present

## 2018-02-03 DIAGNOSIS — D72829 Elevated white blood cell count, unspecified: Secondary | ICD-10-CM

## 2018-02-03 DIAGNOSIS — I471 Supraventricular tachycardia: Secondary | ICD-10-CM | POA: Diagnosis present

## 2018-02-03 DIAGNOSIS — G894 Chronic pain syndrome: Secondary | ICD-10-CM | POA: Diagnosis present

## 2018-02-03 DIAGNOSIS — R0989 Other specified symptoms and signs involving the circulatory and respiratory systems: Secondary | ICD-10-CM | POA: Diagnosis not present

## 2018-02-03 DIAGNOSIS — Z825 Family history of asthma and other chronic lower respiratory diseases: Secondary | ICD-10-CM

## 2018-02-03 DIAGNOSIS — I10 Essential (primary) hypertension: Secondary | ICD-10-CM | POA: Diagnosis present

## 2018-02-03 DIAGNOSIS — T50995A Adverse effect of other drugs, medicaments and biological substances, initial encounter: Secondary | ICD-10-CM | POA: Diagnosis not present

## 2018-02-03 DIAGNOSIS — Z8249 Family history of ischemic heart disease and other diseases of the circulatory system: Secondary | ICD-10-CM

## 2018-02-03 DIAGNOSIS — M797 Fibromyalgia: Secondary | ICD-10-CM | POA: Diagnosis present

## 2018-02-03 DIAGNOSIS — E038 Other specified hypothyroidism: Secondary | ICD-10-CM | POA: Diagnosis present

## 2018-02-03 DIAGNOSIS — Z801 Family history of malignant neoplasm of trachea, bronchus and lung: Secondary | ICD-10-CM

## 2018-02-03 DIAGNOSIS — E869 Volume depletion, unspecified: Secondary | ICD-10-CM | POA: Diagnosis present

## 2018-02-03 DIAGNOSIS — Y92239 Unspecified place in hospital as the place of occurrence of the external cause: Secondary | ICD-10-CM | POA: Diagnosis not present

## 2018-02-03 DIAGNOSIS — R918 Other nonspecific abnormal finding of lung field: Secondary | ICD-10-CM

## 2018-02-03 DIAGNOSIS — J45901 Unspecified asthma with (acute) exacerbation: Secondary | ICD-10-CM

## 2018-02-03 DIAGNOSIS — R0602 Shortness of breath: Secondary | ICD-10-CM | POA: Diagnosis not present

## 2018-02-03 DIAGNOSIS — J189 Pneumonia, unspecified organism: Secondary | ICD-10-CM | POA: Diagnosis not present

## 2018-02-03 DIAGNOSIS — Z888 Allergy status to other drugs, medicaments and biological substances status: Secondary | ICD-10-CM | POA: Diagnosis not present

## 2018-02-03 DIAGNOSIS — R0600 Dyspnea, unspecified: Secondary | ICD-10-CM

## 2018-02-03 DIAGNOSIS — J151 Pneumonia due to Pseudomonas: Secondary | ICD-10-CM

## 2018-02-03 DIAGNOSIS — D72823 Leukemoid reaction: Secondary | ICD-10-CM | POA: Diagnosis present

## 2018-02-03 DIAGNOSIS — Z7951 Long term (current) use of inhaled steroids: Secondary | ICD-10-CM

## 2018-02-03 DIAGNOSIS — R9431 Abnormal electrocardiogram [ECG] [EKG]: Secondary | ICD-10-CM | POA: Diagnosis not present

## 2018-02-03 DIAGNOSIS — H409 Unspecified glaucoma: Secondary | ICD-10-CM | POA: Diagnosis present

## 2018-02-03 DIAGNOSIS — Z9842 Cataract extraction status, left eye: Secondary | ICD-10-CM

## 2018-02-03 DIAGNOSIS — J188 Other pneumonia, unspecified organism: Secondary | ICD-10-CM | POA: Diagnosis not present

## 2018-02-03 DIAGNOSIS — D649 Anemia, unspecified: Secondary | ICD-10-CM | POA: Diagnosis present

## 2018-02-03 DIAGNOSIS — Z7722 Contact with and (suspected) exposure to environmental tobacco smoke (acute) (chronic): Secondary | ICD-10-CM | POA: Diagnosis present

## 2018-02-03 DIAGNOSIS — H4089 Other specified glaucoma: Secondary | ICD-10-CM | POA: Diagnosis present

## 2018-02-03 LAB — CBC WITH DIFFERENTIAL/PLATELET
Abs Immature Granulocytes: 1.61 10*3/uL — ABNORMAL HIGH (ref 0.00–0.07)
Basophils Absolute: 0.1 10*3/uL (ref 0.0–0.1)
Basophils Relative: 0 %
Eosinophils Absolute: 0.7 10*3/uL — ABNORMAL HIGH (ref 0.0–0.5)
Eosinophils Relative: 2 %
HCT: 31.9 % — ABNORMAL LOW (ref 36.0–46.0)
Hemoglobin: 9.9 g/dL — ABNORMAL LOW (ref 12.0–15.0)
Immature Granulocytes: 4 %
Lymphocytes Relative: 3 %
Lymphs Abs: 1.1 10*3/uL (ref 0.7–4.0)
MCH: 31.1 pg (ref 26.0–34.0)
MCHC: 31 g/dL (ref 30.0–36.0)
MCV: 100.3 fL — AB (ref 80.0–100.0)
MONO ABS: 1.5 10*3/uL — AB (ref 0.1–1.0)
MONOS PCT: 4 %
Neutro Abs: 38.4 10*3/uL — ABNORMAL HIGH (ref 1.7–7.7)
Neutrophils Relative %: 87 %
Platelets: ADEQUATE 10*3/uL (ref 150–400)
RBC: 3.18 MIL/uL — ABNORMAL LOW (ref 3.87–5.11)
RDW: 14.4 % (ref 11.5–15.5)
WBC Morphology: INCREASED
WBC: 43.5 10*3/uL — ABNORMAL HIGH (ref 4.0–10.5)
nRBC: 0.1 % (ref 0.0–0.2)

## 2018-02-03 LAB — BASIC METABOLIC PANEL
Anion gap: 13 (ref 5–15)
BUN: 36 mg/dL — ABNORMAL HIGH (ref 8–23)
CO2: 21 mmol/L — AB (ref 22–32)
Calcium: 8.7 mg/dL — ABNORMAL LOW (ref 8.9–10.3)
Chloride: 100 mmol/L (ref 98–111)
Creatinine, Ser: 2.02 mg/dL — ABNORMAL HIGH (ref 0.44–1.00)
GFR calc Af Amer: 28 mL/min — ABNORMAL LOW (ref >60)
GFR calc non Af Amer: 24 mL/min — ABNORMAL LOW (ref >60)
Glucose, Bld: 91 mg/dL (ref 70–99)
Potassium: 4.7 mmol/L (ref 3.5–5.1)
Sodium: 134 mmol/L — ABNORMAL LOW (ref 135–145)

## 2018-02-03 LAB — POCT I-STAT TROPONIN I: Troponin i, poc: 0.07 ng/mL (ref 0.00–0.08)

## 2018-02-03 LAB — CG4 I-STAT (LACTIC ACID): Lactic Acid, Venous: 1.12 mmol/L (ref 0.5–1.9)

## 2018-02-03 MED ORDER — DOXYCYCLINE HYCLATE 100 MG PO TABS
100.0000 mg | ORAL_TABLET | Freq: Once | ORAL | Status: AC
  Administered 2018-02-03: 100 mg via ORAL
  Filled 2018-02-03: qty 1

## 2018-02-03 MED ORDER — SODIUM CHLORIDE 0.9 % IV BOLUS
1000.0000 mL | Freq: Once | INTRAVENOUS | Status: AC
  Administered 2018-02-03: 1000 mL via INTRAVENOUS

## 2018-02-03 MED ORDER — SODIUM CHLORIDE 0.9 % IV SOLN
2.0000 g | Freq: Once | INTRAVENOUS | Status: AC
  Administered 2018-02-04: 2 g via INTRAVENOUS
  Filled 2018-02-03: qty 20

## 2018-02-03 NOTE — ED Provider Notes (Signed)
[facility] Emergency Department Provider Note MRN:  [id number]  Arrival date & time: 02/04/18     Chief Complaint   Cough   History of Present Illness   Heidi Fuentes is a 73 y.o. year-old female with a history of GERD, fibromyalgia presenting to the ED with chief complaint of cough.  1 week of persistent cough, followed by aggressively worsening shortness of breath.  Patient has also been having persistent nosebleeds for several weeks.  Has had 3 falls in the past 2 weeks, broke her ribs a few weeks ago, thinks that her recent falls have exacerbated this pain.  Pain is intermittent, worse with movement, located on the right lateral ribs.  Denies headache or vision change, no fever, no abdominal pain, no numbness or weakness to the arms or legs.  Review of Systems  A complete 10 system review of systems was obtained and all systems are negative except as noted in the HPI and PMH.   Patient's Health History    Past Medical History:  Diagnosis Date  . Arthritis   . Asthma   . Back pain   . Chronic pain syndrome 07/10/2013  . Depression   . Duodenal papillary stenosis   . Dyspnea   . Esophageal dysmotility   . Fibromyalgia   . GERD (gastroesophageal reflux disease)   . Glaucoma   . H/O wheezing   . History of hiatal hernia   . History of palpitations   . HOH (hard of hearing)   . HOH (hard of hearing)   . Hypertension   . Hypothyroidism   . Macular degeneration   . Orthopnea   . Stenosis, spinal, lumbar   . Thyroid disease   . Tubular adenoma   . 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[facility])     Past Surgical History:  Procedure Laterality Date  . CATARACT EXTRACTION W/PHACO Right 10/06/2016   Procedure: CATARACT EXTRACTION PHACO AND INTRAOCULAR LENS PLACEMENT (IOC);  Surgeon: Birder Robson, MD;  Location: [facility] ORS;  Service: Ophthalmology;  Laterality: Right;  [place] 00:32.5 AP% 14.5 CDE 4.71 Fluid pack lot # [id number]  . CATARACT EXTRACTION W/PHACO Left  11/03/2016   Procedure: CATARACT EXTRACTION PHACO AND INTRAOCULAR LENS PLACEMENT (IOC);  Surgeon: Birder Robson, MD;  Location: [facility] ORS;  Service: Ophthalmology;  Laterality: Left;  [place] 00:36.6 AP% 16.0 CDE 5.86 Fluid Pack lot # [id number]  . COCCYX REMOVAL    . colonoscopy  2005   Dr. Laural Golden: mild melanosis coli, otherwise normal  . COLONOSCOPY WITH PROPOFOL N/A 09/14/2013   QIO:[id number] coli. Colonic diverticulosis. Single colonic. Tubular adenoma. Next TCS 09/2020.  [name] ERCP  1997   [facility]: biliary manometry abnormal, subsequent sphincterotomy   . ESOPHAGEAL MANOMETRY N/A 12/21/2016   Procedure: ESOPHAGEAL MANOMETRY (EM);  Surgeon: Mauri Pole, MD;  Location: [facility];  Service: Endoscopy;  Laterality: N/A;  . ESOPHAGOGASTRODUODENOSCOPY (EGD) WITH PROPOFOL N/A 09/14/2013   LKG:[id number]'D ring. Hiatal hernia. Status post Venia Minks and biopsy disruption.   [name] FOOT SURGERY     X2  . HEMORROIDECTOMY    . HERNIA REPAIR     inguinal right  . [place] N/A 09/14/2013   Procedure: Venia Minks DILATION;  Surgeon: [name], MD;  Location: [facility] ORS;  Service: Endoscopy;  Laterality: N/A;  56  . POLYPECTOMY N/A 09/14/2013   Procedure: POLYPECTOMY;  Surgeon: [name], MD;  Location: [facility]S;  Service: Endoscopy;  Laterality: N/A;  . TONSILLECTOMY  Family History  Problem Relation Age of Onset  . Lung cancer Mother   . Heart attack Father   . Bone cancer Brother   . COPD Sister   . Colon cancer Neg Hx     Social History   Socioeconomic History  . Marital status: Divorced    Spouse name: Not on file  . Number of children: Not on file  . Years of education: Not on file  . Highest education level: Not on file  Occupational History  . Not on file  Social Needs  . Financial resource strain: Not on file  . Food insecurity:    Worry: Not on file    Inability: Not on file  . Transportation needs:    Medical: Not on file    Non-medical: Not on  file  Tobacco Use  . Smoking status: Never Smoker  . Smokeless tobacco: Never Used  . Tobacco comment: NEVER SMOKED  Substance and Sexual Activity  . Alcohol use: No  . Drug use: No  . Sexual activity: Not on file  Lifestyle  . Physical activity:    Days per week: Not on file    Minutes per session: Not on file  . Stress: Not on file  Relationships  . Social connections:    Talks on phone: Not on file    Gets together: Not on file    Attends religious service: Not on file    Active member of club or organization: Not on file    Attends meetings of clubs or organizations: Not on file    Relationship status: Not on file  . Intimate partner violence:    Fear of current or ex partner: Not on file    Emotionally abused: Not on file    Physically abused: Not on file    Forced sexual activity: Not on file  Other Topics Concern  . Not on file  Social History Narrative  . Not on file     Physical Exam  Vital Signs and Nursing Notes reviewed Vitals:   02/03/18 2300 02/03/18 2334  BP:  (!) 177/90  Pulse: 96 (!) 128  Resp: 20   Temp:  98.2 F (36.8 C)  SpO2: 93% 93%    CONSTITUTIONAL: Well-appearing, NAD NEURO:  Alert and oriented x 3, no focal deficits EYES:  eyes equal and reactive ENT/NECK:  no LAD, no JVD CARDIO: Regular rate, well-perfused, normal S1 and S2 PULM: Diffuse rhonchi GI/GU:  normal bowel sounds, non-distended, non-tender MSK/SPINE:  No gross deformities, no edema SKIN:  no rash, atraumatic PSYCH:  Appropriate speech and behavior  Diagnostic and Interventional Summary    EKG Interpretation  Date/Time:    Ventricular Rate:    PR Interval:    QRS Duration:   QT Interval:    QTC Calculation:   R Axis:     Text Interpretation:        Labs Reviewed  CBC WITH DIFFERENTIAL/PLATELET - Abnormal; Notable for the following components:      Result Value   WBC 43.5 (*)    RBC 3.18 (*)    Hemoglobin 9.9 (*)    HCT 31.9 (*)    MCV 100.3 (*)    Neutro  Abs 38.4 (*)    Monocytes Absolute 1.5 (*)    Eosinophils Absolute 0.7 (*)    Abs Immature Granulocytes 1.61 (*)    All other components within normal limits  BASIC METABOLIC PANEL - Abnormal; Notable for the following components:  Sodium 134 (*)    CO2 21 (*)    BUN 36 (*)    Creatinine, Ser 2.02 (*)    Calcium 8.7 (*)    GFR calc non Af Amer 24 (*)    GFR calc Af Amer 28 (*)    All other components within normal limits  I-STAT TROPONIN, ED  CG4 I-STAT (LACTIC ACID)  POCT I-STAT TROPONIN I    DG Chest 2 View  Final Result    CT CHEST WO CONTRAST    (Results Pending)    Medications  cefTRIAXone (ROCEPHIN) 2 g in sodium chloride 0.9 % 100 mL IVPB (has no administration in time range)  sodium chloride 0.9 % bolus 1,000 mL (1,000 mLs Intravenous New Bag/Given 02/03/18 2241)  doxycycline (VIBRA-TABS) tablet 100 mg (100 mg Oral Given 02/03/18 2242)     Procedures Critical Care  ED Course and Medical Decision Making  I have reviewed the triage vital signs and the nursing notes.  Pertinent labs & imaging results that were available during my care of the patient were reviewed by me and considered in my medical decision making (see below for details).  Concern for pneumonia in this 73 year old female with progressively worsening cough and shortness of breath, confirmed on chest x-ray.  More concerning would be patient's white blood cell count in the 40s, concern for possible underlying leukemia given recent epistaxis.  Provided with community-acquired pneumonia antibiotic coverage, given atypical nodules on chest x-ray and the concern for underlying malignancy, CT chest ordered for further evaluation.  Admitted to hospital service for further care.  Barth Kirks. Sedonia Small, MD [place] [email or web address]  Final Clinical Impressions(s) / ED Diagnoses     ICD-10-CM   1. Community acquired pneumonia, unspecified laterality J18.9   2.  Leukocytosis, unspecified type D72.829     ED Discharge Orders    None         Maudie Flakes, MD 02/04/18 0001

## 2018-02-03 NOTE — ED Triage Notes (Signed)
Patient complaining of cough x 1 week. States she is coughing up green colored sputum. Also complaining of back pain.

## 2018-02-03 NOTE — ED Notes (Signed)
Called AC for abx  

## 2018-02-03 NOTE — H&P (Signed)
History and Physical    Heidi Fuentes:[id number] DOB: 08/18/1945 DOA: 02/03/2018  PCP: Orpah Melter, MD   Patient coming from: Home.  I have personally briefly reviewed patient's old medical records in [place]  Chief Complaint: Cough.  HPI: Heidi Fuentes is a 73 y.o. female with medical history significant of osteoarthritis, asthma, back pain, chronic pain syndrome, depression, duodenal papillary stenosis, esophageal dysmotility, GERD/hiatal hernia, fibromyalgia, glaucoma, macular degeneration, palpitations, hypertension, hypothyroidism, hypothyroidism, tubular adenoma who is coming to the emergency department complaints of cough with green sputum production for the past week associated with fatigue, malaise, decreased appetite, decreased sleep, frontal headache, pleuritic chest pain, back pain and upper abdominal pain with cough.  She has also been having nosebleeds for several weeks.  She has had hemoptysis on occasion when expectorating sputum.  She denies fever, but complains of chills.  She denies nausea, emesis, diarrhea, melena or hematochezia.  She has been constipated.  She is states that for the past day or 2 she has been urinating less.  Her urine looks concentrated.  She denies dysuria, frequency or hematuria.  No polyuria, polydipsia, polyphagia or blurred vision.  Denies skin rashes or pruritus.  ED Course: Initial vital signs temperature 97.8 F, pulse 113, respirations 23, blood pressure 142/88 mmHg and O2 sat 90% on room air.  The patient was given a 2000 mL of normal saline bolus, ceftriaxone 1 g IVPB and doxycycline 100 mg p.o. x1 dose.  White count was 43.5 with 87% neutrophils, 3% lymphocytes, 4% monocytes 2% eosinophils, hemoglobin 9.9 g/dL and there were platelet clumps.  Sodium 134, potassium 4.7, chloride 100 and CO2 21 mmol/L.  Glucose 91, BUN 36, creatinine 2.02 and calcium 8.7 mg/dL.  Troponin and lactic acid were normal.   Imaging: chest radiograph shows  left lower lobe pneumonia.  There is additional small opacities in the left lower and right upper lobes may reflect additional size of atypical infection or inflammation.  Review of Systems: As per HPI otherwise 10 point review of systems negative.   Past Medical History:  Diagnosis Date  . Arthritis   . Asthma   . Back pain   . Chronic pain syndrome 07/10/2013  . Depression   . Duodenal papillary stenosis   . Dyspnea   . Esophageal dysmotility   . Fibromyalgia   . GERD (gastroesophageal reflux disease)   . Glaucoma   . H/O wheezing   . History of hiatal hernia   . History of palpitations   . HOH (hard of hearing)   . HOH (hard of hearing)   . Hypertension   . Hypothyroidism   . Macular degeneration   . Orthopnea   . Stenosis, spinal, lumbar   . Thyroid disease   . Tubular adenoma   . 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[facility])     Past Surgical History:  Procedure Laterality Date  . CATARACT EXTRACTION W/PHACO Right 10/06/2016   Procedure: CATARACT EXTRACTION PHACO AND INTRAOCULAR LENS PLACEMENT (IOC);  Surgeon: Birder Robson, MD;  Location: [facility] ORS;  Service: Ophthalmology;  Laterality: Right;  [place] 00:32.5 AP% 14.5 CDE 4.71 Fluid pack lot # [id number]  . CATARACT EXTRACTION W/PHACO Left 11/03/2016   Procedure: CATARACT EXTRACTION PHACO AND INTRAOCULAR LENS PLACEMENT (IOC);  Surgeon: Birder Robson, MD;  Location: [facility] ORS;  Service: Ophthalmology;  Laterality: Left;  [place] 00:36.6 AP% 16.0 CDE 5.86 Fluid Pack lot # [id number]  . COCCYX REMOVAL    . colonoscopy  2005   Dr.  Rehman: mild melanosis coli, otherwise normal  . COLONOSCOPY WITH PROPOFOL N/A 09/14/2013   ZOX:[id number] coli. Colonic diverticulosis. Single colonic. Tubular adenoma. Next TCS 09/2020.  [name] ERCP  1997   [facility]: biliary manometry abnormal, subsequent sphincterotomy   . ESOPHAGEAL MANOMETRY N/A 12/21/2016   Procedure: ESOPHAGEAL MANOMETRY (EM);  Surgeon: Mauri Pole, MD;  [facility];   Service: Endoscopy;  Laterality: N/A;  . ESOPHAGOGASTRODUODENOSCOPY (EGD) WITH PROPOFOL N/A 09/14/2013   WJX:[id number]'A ring. Hiatal hernia. Status post Venia Minks and biopsy disruption.   [name] FOOT SURGERY     X2  . HEMORROIDECTOMY    . HERNIA REPAIR     inguinal right  . [place] N/A 09/14/2013   Procedure: Venia Minks DILATION;  Surgeon: [name], MD;  Location: [facility];  Service: Endoscopy;  Laterality: N/A;  56  . POLYPECTOMY N/A 09/14/2013   Procedure: POLYPECTOMY;  Surgeon: [name], MD;  Location: [facility];  Service: Endoscopy;  Laterality: N/A;  . TONSILLECTOMY       reports that she has never smoked. She has never used smokeless tobacco. She reports that she does not drink alcohol or use drugs.  Allergies  Allergen Reactions  . Abilify [Aripiprazole]   . Geodon [Ziprasidone Hcl]   . Trazodone And Nefazodone   . Sulfa Antibiotics Rash    Family History  Problem Relation Age of Onset  . Lung cancer Mother   . Heart attack Father   . Bone cancer Brother   . COPD Sister   . Colon cancer Neg Hx    Prior to Admission medications   Medication Sig Start Date End Date Taking? Authorizing Provider  acetaminophen (TYLENOL) 500 MG tablet Take 500 mg by mouth every 6 (six) hours as needed. For pain     [provider]  albuterol (VENTOLIN HFA) 108 (90 BASE) MCG/ACT inhaler Inhale 2 puffs into the lungs every 6 (six) hours as needed for wheezing or shortness of breath. For shortness of breath     [provider]  Biotin 10000 MCG TABS Take 1 capsule by mouth daily.    [provider]  buPROPion (WELLBUTRIN XL) 300 MG 24 hr tablet Take 300 mg by mouth daily.    [provider]  citalopram (CELEXA) 10 MG tablet Take 10 mg by mouth daily.    [provider]  ferrous sulfate 325 (65 FE) MG tablet Take 325 mg by mouth daily with breakfast.    [provider]  FLUoxetine (PROZAC) 20 MG capsule Take 40 mg by  mouth daily.     [provider]  fluticasone (FLONASE) 50 MCG/ACT nasal spray Place 2 sprays into both nostrils daily.     [provider]  fluticasone-salmeterol (ADVAIR HFA) 230-21 MCG/ACT inhaler Inhale 2 puffs into the lungs 2 (two) times daily.    [provider]  furosemide (LASIX) 40 MG tablet Take 40 mg by mouth daily. 04/18/13   [provider]  HYDROcodone-acetaminophen (NORCO) 10-325 MG tablet Norco 10 mg-325 mg tablet  Take 1 tablet 4 times a day by oral route as needed. 01/08/17   [provider]  lamoTRIgine (LAMICTAL) 150 MG tablet Take 300 mg by mouth daily.     [provider]  latanoprost (XALATAN) 0.005 % ophthalmic solution Place 1 drop into both eyes at bedtime. 04/20/13   [provider]  levothyroxine (SYNTHROID, LEVOTHROID) 50 MCG tablet Take 50 mcg by mouth daily.  [provider]  losartan (COZAAR) 50 MG tablet Take 50 mg by mouth daily.  10/06/17   [provider]  meclizine (ANTIVERT) 25 MG tablet Take 25 mg by mouth 3 (three) times daily as needed for dizziness.  08/02/17   [provider]  metoprolol succinate (TOPROL-XL) 50 MG 24 hr tablet Take 50 mg by mouth daily. 04/18/13   [provider]  mirtazapine (REMERON) 7.5 MG tablet mirtazapine 7.5 mg tablet    [provider]  montelukast (SINGULAIR) 10 MG tablet Take 10 mg by mouth daily. 05/03/13   [provider]  moxifloxacin 0.1% INJ moxifloxacin 0.5 % eye drops    [provider]  Multiple Vitamins-Minerals (PRESERVISION/LUTEIN PO) Take 1 tablet by mouth daily.    [provider]  oxyCODONE-acetaminophen (PERCOCET/ROXICET) 5-325 MG tablet Take 1-2 tablets by mouth every 8 (eight) hours as needed for severe pain. 08/18/17   Volanda Napoleon, PA-C  pantoprazole (PROTONIX) 40 MG tablet TAKE 1 TABLET EVERY DAY 09/03/16   Carlis Stable, NP  polyethylene glycol (MIRALAX / GLYCOLAX) packet Take 17  g by mouth daily.    [provider]  pravastatin (PRAVACHOL) 40 MG tablet Take 40 mg by mouth daily.    [provider]  tiZANidine (ZANAFLEX) 4 MG tablet Take 4 mg by mouth 2 (two) times daily as needed for muscle spasms.    [provider]  triamcinolone cream (KENALOG) 0.5 % Apply 1 application topically 2 (two) times daily.     [provider]    Physical Exam: Vitals:   02/03/18 1831 02/03/18 2232 02/03/18 2300  BP: (!) 142/88 130/73   Pulse: (!) 113  96  Resp: (!) 23  20  Temp: 97.8 F (36.6 C)    TempSrc: Oral    SpO2: 90%  93%  Weight: 68.9 kg    Height: 5' (1.524 m)      Constitutional: Looks acutely ill and frequently coughing. Eyes: PERRL, lids and conjunctivae are injected. ENMT: Mucous membranes are dry.  Posterior pharynx has mild erythema, but is at least partially clear of any exudate or lesions. Neck: normal, supple, no masses, no thyromegaly Respiratory: Mildly tachypneic.  Decreased breath sounds with bilateral rhonchi and wheezing.  No accessory muscle use.  Cardiovascular: Tachycardic at 112 bpm, no murmurs / rubs / gallops. No extremity edema. 2+ pedal pulses. No carotid bruits.  Abdomen: Soft, positive upper quadrant and lower chest wall tenderness, no guarding or rebound, no masses palpated. No hepatosplenomegaly. Bowel sounds positive.  Musculoskeletal: no clubbing / cyanosis.  Good ROM, no contractures. Normal muscle tone.  Skin: Decreased skin turgor, otherwise no significant rashes, lesions, ulcers on limited dermatological examination Neurologic: CN 2-12 grossly intact. Sensation intact, DTR normal. Strength 5/5 in all 4.  Psychiatric: Normal judgment and insight. Alert and oriented x 4. Normal mood.   Labs on Admission: I have personally reviewed following labs and imaging studies  CBC: Recent Labs  Lab 02/03/18 1924  WBC 43.5*  NEUTROABS 38.4*  HGB 9.9*  HCT 31.9*  MCV 100.3*  PLT PLATELET CLUMPS NOTED ON  SMEAR, COUNT APPEARS ADEQUATE   Basic Metabolic Panel: Recent Labs  Lab 02/03/18 1924  NA 134*  K 4.7  CL 100  CO2 21*  GLUCOSE 91  BUN 36*  CREATININE 2.02*  CALCIUM 8.7*   GFR: Estimated Creatinine Clearance: 21.8 mL/min (A) (by C-G formula based on SCr of 2.02 mg/dL (H)). Liver Function Tests: No results for input(s):  AST, ALT, ALKPHOS, BILITOT, PROT, ALBUMIN in the last 168 hours. No results for input(s): LIPASE, AMYLASE in the last 168 hours. No results for input(s): AMMONIA in the last 168 hours. Coagulation Profile: No results for input(s): INR, PROTIME in the last 168 hours. Cardiac Enzymes: No results for input(s): CKTOTAL, CKMB, CKMBINDEX, TROPONINI in the last 168 hours. BNP (last 3 results) No results for input(s): PROBNP in the last 8760 hours. HbA1C: No results for input(s): HGBA1C in the last 72 hours. CBG: No results for input(s): GLUCAP in the last 168 hours. Lipid Profile: No results for input(s): CHOL, HDL, LDLCALC, TRIG, CHOLHDL, LDLDIRECT in the last 72 hours. Thyroid Function Tests: No results for input(s): TSH, T4TOTAL, FREET4, T3FREE, THYROIDAB in the last 72 hours. Anemia Panel: No results for input(s): VITAMINB12, FOLATE, FERRITIN, TIBC, IRON, RETICCTPCT in the last 72 hours. Urine analysis:    Component Value Date/Time   COLORURINE YELLOW 10/26/2017 2030   APPEARANCEUR CLEAR 10/26/2017 2030   APPEARANCEUR Cloudy 06/02/2012 1042   LABSPEC 1.019 10/26/2017 2030   LABSPEC 1.017 06/02/2012 1042   PHURINE 6.0 10/26/2017 2030   GLUCOSEU NEGATIVE 10/26/2017 2030   GLUCOSEU Negative 06/02/2012 1042   HGBUR NEGATIVE 10/26/2017 2030   [place] NEGATIVE 10/26/2017 2030   BILIRUBINUR Negative 06/02/2012 1042   KETONESUR 80 (A) 10/26/2017 2030   PROTEINUR 30 (A) 10/26/2017 2030   UROBILINOGEN 0.2 12/11/2014 1429   NITRITE NEGATIVE 10/26/2017 2030   LEUKOCYTESUR NEGATIVE 10/26/2017 2030   LEUKOCYTESUR 3+ 06/02/2012 1042    Radiological Exams  on Admission: Dg Chest 2 View  Result Date: 02/03/2018 CLINICAL DATA:  Worsening cough and chest pain over the past week. EXAM: CHEST - 2 VIEW COMPARISON:  Chest x-ray dated October 26, 2017. FINDINGS: The heart size and mediastinal contours are within normal limits. Increased interstitial markings throughout the left lung and at the right lung base. New small right pleural effusion. Patchy opacity in the left lower lobe. Additional small nodular opacities in the left lower and right upper lobes. No pneumothorax. No acute osseous abnormality. IMPRESSION: 1. Left lower lobe pneumonia. Additional small nodular opacities in the left lower and right upper lobes may reflect additional sites of atypical infection or inflammation. Consider chest CT for further evaluation or followup PA and lateral chest X-ray in 3-4 weeks following trial of antibiotic therapy to ensure resolution. 2. Small right pleural effusion. Electronically Signed   By: Titus Dubin M.D.   On: 02/03/2018 19:28    EKG: Independently reviewed.   Assessment/Plan Principal Problem:   CAP (community acquired pneumonia) Admit to telemetry/inpatient. Continue supplemental oxygen. As scheduled and as needed bronchodilators. Continue ceftriaxone 1 g IVPB daily. Continue doxycycline 100 mg p.o. twice daily. Check sputum Gram stain, culture and sensitivity. Follow-up blood culture and sensitivity. Check strep pneumonia urinary antigen.  Active Problems:   AKI (acute kidney injury) ([place]) Hold furosemide. Continue IV fluids. Monitor intake and output. Follow-up renal function and electrolytes.    Hypothyroid Continue levothyroxine once dose confirmed. Check TSH as needed.    Asthma Continue supplemental oxygen. Continue bronchodilators.    Chronic pain syndrome Resume analgesics once med reconciliation is done.    Depression Resume Wellbutrin and fluoxetine once med reconciliation is performed.    Anemia Monitor  hematocrit and hemoglobin. Check anemia panel.    GERD (gastroesophageal reflux disease) Protonix 40 mg p.o. daily.    Glaucoma Continue Xalatan drops.    Hypertension Hold furosemide and losartan. Continue metoprolol 50 mg p.o. daily  once dose is confirmed. Monitor BP, renal function and electrolytes.   DVT prophylaxis: SCDs. Code Status: Full code. Family Communication: Disposition Plan: Admit for IV hydration, IV antibiotics and hematology evaluation. Consults called: Routine hematology consult. Admission status: Inpatient/telemetry.   Reubin Milan MD [facility]  If 7PM-7AM, please contact night-coverage [email or web address] Password [facility]  02/03/2018, 11:09 PM

## 2018-02-04 DIAGNOSIS — J151 Pneumonia due to Pseudomonas: Secondary | ICD-10-CM

## 2018-02-04 DIAGNOSIS — J181 Lobar pneumonia, unspecified organism: Secondary | ICD-10-CM

## 2018-02-04 DIAGNOSIS — J9601 Acute respiratory failure with hypoxia: Secondary | ICD-10-CM

## 2018-02-04 DIAGNOSIS — R918 Other nonspecific abnormal finding of lung field: Secondary | ICD-10-CM

## 2018-02-04 DIAGNOSIS — J45901 Unspecified asthma with (acute) exacerbation: Secondary | ICD-10-CM

## 2018-02-04 DIAGNOSIS — D72829 Elevated white blood cell count, unspecified: Secondary | ICD-10-CM

## 2018-02-04 DIAGNOSIS — N179 Acute kidney failure, unspecified: Secondary | ICD-10-CM | POA: Diagnosis present

## 2018-02-04 LAB — COMPREHENSIVE METABOLIC PANEL
ALT: 37 U/L (ref 0–44)
ANION GAP: 9 (ref 5–15)
AST: 34 U/L (ref 15–41)
Albumin: 2.1 g/dL — ABNORMAL LOW (ref 3.5–5.0)
Alkaline Phosphatase: 152 U/L — ABNORMAL HIGH (ref 38–126)
BUN: 32 mg/dL — ABNORMAL HIGH (ref 8–23)
CO2: 20 mmol/L — ABNORMAL LOW (ref 22–32)
Calcium: 7.9 mg/dL — ABNORMAL LOW (ref 8.9–10.3)
Chloride: 107 mmol/L (ref 98–111)
Creatinine, Ser: 1.68 mg/dL — ABNORMAL HIGH (ref 0.44–1.00)
GFR calc Af Amer: 35 mL/min — ABNORMAL LOW (ref >60)
GFR calc non Af Amer: 30 mL/min — ABNORMAL LOW (ref >60)
Glucose, Bld: 83 mg/dL (ref 70–99)
Potassium: 4 mmol/L (ref 3.5–5.1)
SODIUM: 136 mmol/L (ref 135–145)
Total Bilirubin: 1.1 mg/dL (ref 0.3–1.2)
Total Protein: 5.3 g/dL — ABNORMAL LOW (ref 6.5–8.1)

## 2018-02-04 LAB — VITAMIN B12: Vitamin B-12: 989 pg/mL — ABNORMAL HIGH (ref 180–914)

## 2018-02-04 LAB — EXPECTORATED SPUTUM ASSESSMENT W GRAM STAIN, RFLX TO RESP C

## 2018-02-04 LAB — EXPECTORATED SPUTUM ASSESSMENT W REFEX TO RESP CULTURE

## 2018-02-04 LAB — CBC WITH DIFFERENTIAL/PLATELET
Abs Immature Granulocytes: 1.23 10*3/uL — ABNORMAL HIGH (ref 0.00–0.07)
Basophils Absolute: 0.1 10*3/uL (ref 0.0–0.1)
Basophils Relative: 0 %
Eosinophils Absolute: 0.5 10*3/uL (ref 0.0–0.5)
Eosinophils Relative: 1 %
HCT: 27 % — ABNORMAL LOW (ref 36.0–46.0)
Hemoglobin: 8.6 g/dL — ABNORMAL LOW (ref 12.0–15.0)
Immature Granulocytes: 3 %
LYMPHS PCT: 2 %
Lymphs Abs: 0.8 10*3/uL (ref 0.7–4.0)
MCH: 30.6 pg (ref 26.0–34.0)
MCHC: 31.9 g/dL (ref 30.0–36.0)
MCV: 96.1 fL (ref 80.0–100.0)
Monocytes Absolute: 1.3 10*3/uL — ABNORMAL HIGH (ref 0.1–1.0)
Monocytes Relative: 4 %
Neutro Abs: 34.8 10*3/uL — ABNORMAL HIGH (ref 1.7–7.7)
Neutrophils Relative %: 90 %
Platelets: 356 10*3/uL (ref 150–400)
RBC: 2.81 MIL/uL — AB (ref 3.87–5.11)
RDW: 14.6 % (ref 11.5–15.5)
WBC: 38.8 10*3/uL — AB (ref 4.0–10.5)
nRBC: 0.1 % (ref 0.0–0.2)

## 2018-02-04 LAB — FERRITIN: Ferritin: 332 ng/mL — ABNORMAL HIGH (ref 11–307)

## 2018-02-04 LAB — RETICULOCYTES
Immature Retic Fract: 24.1 % — ABNORMAL HIGH (ref 2.3–15.9)
RBC.: 2.82 MIL/uL — ABNORMAL LOW (ref 3.87–5.11)
Retic Count, Absolute: 27.6 10*3/uL (ref 19.0–186.0)
Retic Ct Pct: 1 % (ref 0.4–3.1)

## 2018-02-04 LAB — IRON AND TIBC
Iron: 6 ug/dL — ABNORMAL LOW (ref 28–170)
Saturation Ratios: 3 % — ABNORMAL LOW (ref 10.4–31.8)
TIBC: 202 ug/dL — ABNORMAL LOW (ref 250–450)
UIBC: 196 ug/dL

## 2018-02-04 LAB — STREP PNEUMONIAE URINARY ANTIGEN
Strep Pneumo Urinary Antigen: NEGATIVE
Strep Pneumo Urinary Antigen: NEGATIVE

## 2018-02-04 LAB — FOLATE: Folate: 8.6 ng/mL (ref >5.9)

## 2018-02-04 LAB — PROCALCITONIN: Procalcitonin: 10.16 ng/mL

## 2018-02-04 LAB — PROTIME-INR
INR: 1.34
Prothrombin Time: 16.4 seconds — ABNORMAL HIGH (ref 11.4–15.2)

## 2018-02-04 LAB — APTT: aPTT: 37 seconds — ABNORMAL HIGH (ref 24–36)

## 2018-02-04 MED ORDER — FERROUS SULFATE 325 (65 FE) MG PO TABS
325.0000 mg | ORAL_TABLET | Freq: Two times a day (BID) | ORAL | Status: DC
  Administered 2018-02-04 – 2018-02-05 (×2): 325 mg via ORAL
  Filled 2018-02-04 (×2): qty 1

## 2018-02-04 MED ORDER — LEVALBUTEROL HCL 1.25 MG/0.5ML IN NEBU
1.2500 mg | INHALATION_SOLUTION | Freq: Four times a day (QID) | RESPIRATORY_TRACT | Status: DC
  Administered 2018-02-05 (×2): 1.25 mg via RESPIRATORY_TRACT
  Filled 2018-02-04 (×2): qty 0.5

## 2018-02-04 MED ORDER — HYDRALAZINE HCL 20 MG/ML IJ SOLN: 10.0000 mg | Freq: Four times a day (QID) | INTRAMUSCULAR | Status: DC | PRN

## 2018-02-04 MED ORDER — DM-GUAIFENESIN ER 30-600 MG PO TB12
1.0000 | ORAL_TABLET | Freq: Two times a day (BID) | ORAL | Status: DC
  Administered 2018-02-04 – 2018-02-05 (×3): 1 via ORAL
  Filled 2018-02-04 (×3): qty 1

## 2018-02-04 MED ORDER — METHYLPREDNISOLONE SODIUM SUCC 40 MG IJ SOLR
40.0000 mg | Freq: Once | INTRAMUSCULAR | Status: AC
  Administered 2018-02-04: 40 mg via INTRAVENOUS
  Filled 2018-02-04: qty 1

## 2018-02-04 MED ORDER — METHYLPREDNISOLONE SODIUM SUCC 125 MG IJ SOLR
60.0000 mg | Freq: Four times a day (QID) | INTRAMUSCULAR | Status: DC
  Administered 2018-02-04 – 2018-02-05 (×5): 60 mg via INTRAVENOUS
  Filled 2018-02-04 (×5): qty 2

## 2018-02-04 MED ORDER — MONTELUKAST SODIUM 10 MG PO TABS
10.0000 mg | ORAL_TABLET | Freq: Every day | ORAL | Status: DC
  Administered 2018-02-04: 10 mg via ORAL
  Filled 2018-02-04: qty 1

## 2018-02-04 MED ORDER — ALBUTEROL SULFATE (2.5 MG/3ML) 0.083% IN NEBU
INHALATION_SOLUTION | RESPIRATORY_TRACT | Status: AC
  Administered 2018-02-04: 2.5 mg
  Filled 2018-02-04: qty 3

## 2018-02-04 MED ORDER — SODIUM CHLORIDE 0.9 % IV SOLN
1.0000 g | INTRAVENOUS | Status: DC
  Administered 2018-02-04: 1 g via INTRAVENOUS
  Filled 2018-02-04 (×2): qty 10
  Filled 2018-02-04: qty 1

## 2018-02-04 MED ORDER — PROCHLORPERAZINE EDISYLATE 10 MG/2ML IJ SOLN
10.0000 mg | Freq: Four times a day (QID) | INTRAMUSCULAR | Status: DC | PRN
  Administered 2018-02-04 – 2018-02-05 (×4): 10 mg via INTRAVENOUS
  Filled 2018-02-04 (×4): qty 2

## 2018-02-04 MED ORDER — OXYCODONE-ACETAMINOPHEN 5-325 MG PO TABS
1.0000 | ORAL_TABLET | ORAL | Status: DC | PRN
  Administered 2018-02-04 – 2018-02-05 (×5): 1 via ORAL
  Filled 2018-02-04 (×5): qty 1

## 2018-02-04 MED ORDER — PRAVASTATIN SODIUM 40 MG PO TABS
40.0000 mg | ORAL_TABLET | Freq: Every day | ORAL | Status: DC
  Administered 2018-02-04: 40 mg via ORAL
  Filled 2018-02-04: qty 1

## 2018-02-04 MED ORDER — ACETAMINOPHEN 650 MG RE SUPP: 650.0000 mg | Freq: Four times a day (QID) | RECTAL | Status: DC | PRN

## 2018-02-04 MED ORDER — LEVALBUTEROL HCL 1.25 MG/0.5ML IN NEBU
1.2500 mg | INHALATION_SOLUTION | Freq: Four times a day (QID) | RESPIRATORY_TRACT | Status: DC
  Administered 2018-02-04 (×3): 1.25 mg via RESPIRATORY_TRACT
  Filled 2018-02-04 (×3): qty 0.5

## 2018-02-04 MED ORDER — MORPHINE SULFATE (PF) 4 MG/ML IV SOLN
INTRAVENOUS | Status: AC
  Filled 2018-02-04: qty 1

## 2018-02-04 MED ORDER — MORPHINE SULFATE (PF) 4 MG/ML IV SOLN
4.0000 mg | Freq: Once | INTRAVENOUS | Status: AC
  Administered 2018-02-04: 4 mg via INTRAVENOUS

## 2018-02-04 MED ORDER — LACTATED RINGERS IV BOLUS
1000.0000 mL | Freq: Once | INTRAVENOUS | Status: AC
  Administered 2018-02-04: 1000 mL via INTRAVENOUS

## 2018-02-04 MED ORDER — DOXYCYCLINE HYCLATE 100 MG PO TABS
100.0000 mg | ORAL_TABLET | Freq: Two times a day (BID) | ORAL | Status: DC
  Administered 2018-02-04 – 2018-02-05 (×3): 100 mg via ORAL
  Filled 2018-02-04 (×3): qty 1

## 2018-02-04 MED ORDER — BUPROPION HCL ER (XL) 300 MG PO TB24
300.0000 mg | ORAL_TABLET | Freq: Every day | ORAL | Status: DC
  Filled 2018-02-04 (×2): qty 1

## 2018-02-04 MED ORDER — PROCHLORPERAZINE EDISYLATE 10 MG/2ML IJ SOLN
5.0000 mg | Freq: Once | INTRAMUSCULAR | Status: AC
  Administered 2018-02-04: 5 mg via INTRAVENOUS
  Filled 2018-02-04: qty 2

## 2018-02-04 MED ORDER — IPRATROPIUM BROMIDE 0.02 % IN SOLN
0.5000 mg | Freq: Four times a day (QID) | RESPIRATORY_TRACT | Status: DC
  Administered 2018-02-04 – 2018-02-05 (×5): 0.5 mg via RESPIRATORY_TRACT
  Filled 2018-02-04 (×5): qty 2.5

## 2018-02-04 MED ORDER — ENOXAPARIN SODIUM 30 MG/0.3ML ~~LOC~~ SOLN
30.0000 mg | SUBCUTANEOUS | Status: DC
  Administered 2018-02-04: 30 mg via SUBCUTANEOUS
  Filled 2018-02-04: qty 0.3

## 2018-02-04 MED ORDER — IPRATROPIUM-ALBUTEROL 0.5-2.5 (3) MG/3ML IN SOLN: 3.0000 mL | Freq: Four times a day (QID) | RESPIRATORY_TRACT | Status: DC

## 2018-02-04 MED ORDER — SODIUM CHLORIDE 0.9 % IV SOLN
INTRAVENOUS | Status: DC
  Administered 2018-02-04 – 2018-02-05 (×3): via INTRAVENOUS

## 2018-02-04 MED ORDER — LEVOTHYROXINE SODIUM 50 MCG PO TABS
50.0000 ug | ORAL_TABLET | Freq: Every day | ORAL | Status: DC
  Administered 2018-02-04 – 2018-02-05 (×2): 50 ug via ORAL
  Filled 2018-02-04 (×2): qty 1

## 2018-02-04 MED ORDER — ACETAMINOPHEN 325 MG PO TABS
650.0000 mg | ORAL_TABLET | Freq: Four times a day (QID) | ORAL | Status: DC | PRN
  Administered 2018-02-04: 650 mg via ORAL
  Filled 2018-02-04: qty 2

## 2018-02-04 MED ORDER — LEVALBUTEROL HCL 0.63 MG/3ML IN NEBU: 0.6300 mg | INHALATION_SOLUTION | Freq: Four times a day (QID) | RESPIRATORY_TRACT | Status: DC | PRN

## 2018-02-04 MED ORDER — SODIUM CHLORIDE 0.9 % IV SOLN
125.0000 mg | Freq: Once | INTRAVENOUS | Status: AC
  Administered 2018-02-04: 125 mg via INTRAVENOUS
  Filled 2018-02-04: qty 10

## 2018-02-04 MED ORDER — DM-GUAIFENESIN ER 30-600 MG PO TB12
1.0000 | ORAL_TABLET | Freq: Two times a day (BID) | ORAL | Status: DC
  Filled 2018-02-04: qty 1

## 2018-02-04 MED ORDER — FLUOXETINE HCL 20 MG PO CAPS
40.0000 mg | ORAL_CAPSULE | Freq: Every day | ORAL | Status: DC
  Administered 2018-02-04 – 2018-02-05 (×2): 40 mg via ORAL
  Filled 2018-02-04 (×2): qty 2

## 2018-02-04 MED ORDER — BUDESONIDE 0.5 MG/2ML IN SUSP
0.5000 mg | Freq: Two times a day (BID) | RESPIRATORY_TRACT | Status: DC
  Administered 2018-02-04 – 2018-02-05 (×3): 0.5 mg via RESPIRATORY_TRACT
  Filled 2018-02-04 (×3): qty 2

## 2018-02-04 NOTE — Consult Note (Signed)
Consult requested by: [facility] hospitalist, Dr. Carles Collet Consult requested for: Abnormal chest CT  HPI: This is a 73 year old who came to the emergency department because of increasing cough congestion shortness of breath fever and confusion.  She is known to have asthma at baseline which seems pretty severe.  She says that she uses her rescue inhaler at least 2 or 3 times a day in addition to being on Advair.  When she came to the emergency department she appeared acutely sick had chest x-ray done that showed what appeared to be multifocal pneumonia and then had CT of the chest that shows pleural-based masses with groundglass opacification in the left upper lobe.  I have personally reviewed the scan.  Agree with plan for biopsy.   She says she got sick on December 26 and started having cough congestion brought up a lot of greenish sputum had fever and then started having some blood tingeing of her sputum.  She had fever and took aspirin for that.  She had nosebleeds.  She has significant history of bipolar disease and apparently was hospitalized at the [facility] September 2019.  She had chest x-ray done here in our emergency department in September 2019 that showed some nodular densities in the right lung.  She had CT chest with IV contrast done in October 2019 which showed small clusters in the lingula and the nodular densities on plain film were felt to be too due to multiple subacute right rib fractures.  She says she still has some chest discomfort.  She fell earlier in the year last year.  Past Medical History:  Diagnosis Date  . Arthritis   . Asthma   . Back pain   . Chronic pain syndrome 07/10/2013  . Depression   . Duodenal papillary stenosis   . Dyspnea   . Esophageal dysmotility   . Fibromyalgia   . GERD (gastroesophageal reflux disease)   . Glaucoma   . H/O wheezing   . History of hiatal hernia   . History of palpitations   . HOH (hard of hearing)    . HOH (hard of hearing)   . Hypertension   . Hypothyroidism   . Macular degeneration   . Orthopnea   . Stenosis, spinal, lumbar   . Thyroid disease   . Tubular adenoma   . 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(HCC)      Family History  Problem Relation Age of Onset  . Lung cancer Mother   . Heart attack Father   . Bone cancer Brother   . COPD Sister   . Colon cancer Neg Hx      Social History   Socioeconomic History  . Marital status: Divorced    Spouse name: Not on file  . Number of children: Not on file  . Years of education: Not on file  . Highest education level: Not on file  Occupational History  . Not on file  Social Needs  . Financial resource strain: Not on file  . Food insecurity:    Worry: Not on file    Inability: Not on file  . Transportation needs:    Medical: Not on file    Non-medical: Not on file  Tobacco Use  . Smoking status: Never Smoker  . Smokeless tobacco: Never Used  . Tobacco comment: NEVER SMOKED  Substance and Sexual Activity  . Alcohol use: No  . Drug use: No  . Sexual activity: Not on file  Lifestyle  .  Physical activity:    Days per week: Not on file    Minutes per session: Not on file  . Stress: Not on file  Relationships  . Social connections:    Talks on phone: Not on file    Gets together: Not on file    Attends religious service: Not on file    Active member of club or organization: Not on file    Attends meetings of clubs or organizations: Not on file    Relationship status: Not on file  Other Topics Concern  . Not on file  Social History Narrative  . Not on file     ROS: Except as mentioned 10 point review of systems is negative    Objective: Vital signs in last 24 hours: Temp:  [97.8 F (36.6 C)-98.2 F (36.8 C)] 98 F (36.7 C) (01/03 0448) Pulse Rate:  [96-128] 98 (01/03 0448) Resp:  [20-23] 20 (01/02 2300) BP: (126-177)/(59-90) 126/59 (01/03 0448) SpO2:  [90 %-98 %] 96 % (01/03 0758) Weight:  [68.9 kg]  68.9 kg (01/02 1831) Weight change:     Intake/Output from previous day: No intake/output data recorded.  PHYSICAL EXAM Constitutional: She looks acutely sick.  Eyes: Pupils react EOMI.  Ears nose mouth and throat: Her mucous membranes are moist.  Cardiovascular: Her heart is regular with mild tachycardia.  Respiratory: Respiratory effort is increased.  She has bilateral wheezes.  Gastrointestinal: Her abdomen is soft with no masses.  Skin: Warm and dry gastrointestinal: Her abdomen is soft with no masses musculoskeletal: Normal strength in the upper and lower extremities bilaterally psychiatric: She seems mildly confused  Lab Results: Basic Metabolic Panel: Recent Labs    02/03/18 1924 02/04/18 0257  NA 134* 136  K 4.7 4.0  CL 100 107  CO2 21* 20*  GLUCOSE 91 83  BUN 36* 32*  CREATININE 2.02* 1.68*  CALCIUM 8.7* 7.9*   Liver Function Tests: Recent Labs    02/04/18 0257  AST 34  ALT 37  ALKPHOS 152*  BILITOT 1.1  PROT 5.3*  ALBUMIN 2.1*   No results for input(s): LIPASE, AMYLASE in the last 72 hours. No results for input(s): AMMONIA in the last 72 hours. CBC: Recent Labs    02/03/18 1924 02/04/18 0257  WBC 43.5* 38.8*  NEUTROABS 38.4* 34.8*  HGB 9.9* 8.6*  HCT 31.9* 27.0*  MCV 100.3* 96.1  PLT PLATELET CLUMPS NOTED ON SMEAR, COUNT APPEARS ADEQUATE 356   Cardiac Enzymes: No results for input(s): CKTOTAL, CKMB, CKMBINDEX, TROPONINI in the last 72 hours. BNP: No results for input(s): PROBNP in the last 72 hours. D-Dimer: No results for input(s): DDIMER in the last 72 hours. CBG: No results for input(s): GLUCAP in the last 72 hours. Hemoglobin A1C: No results for input(s): HGBA1C in the last 72 hours. Fasting Lipid Panel: No results for input(s): CHOL, HDL, LDLCALC, TRIG, CHOLHDL, LDLDIRECT in the last 72 hours. Thyroid Function Tests: No results for input(s): TSH, T4TOTAL, FREET4, T3FREE, THYROIDAB in the last 72 hours. Anemia Panel: Recent Labs     02/04/18 0257  RETICCTPCT 1.0   Coagulation: Recent Labs    02/04/18 0257  LABPROT 16.4*  INR 1.34   Urine Drug Screen: Drugs of Abuse     Component Value Date/Time   LABOPIA POSITIVE (A) 10/26/2017 2053   COCAINSCRNUR NONE DETECTED 10/26/2017 2053   COCAINSCRNUR NEGATIVE 05/29/2012 1345   LABBENZ NONE DETECTED 10/26/2017 2053   AMPHETMU NONE DETECTED 10/26/2017 2053   THCU NONE DETECTED  10/26/2017 2053   LABBARB NONE DETECTED 10/26/2017 2053    Alcohol Level: No results for input(s): ETH in the last 72 hours. Urinalysis: No results for input(s): COLORURINE, LABSPEC, PHURINE, GLUCOSEU, HGBUR, BILIRUBINUR, KETONESUR, PROTEINUR, UROBILINOGEN, NITRITE, LEUKOCYTESUR in the last 72 hours.  Invalid input(s): APPERANCEUR Misc. Labs:   ABGS: No results for input(s): PHART, PO2ART, TCO2, HCO3 in the last 72 hours.  Invalid input(s): PCO2   MICROBIOLOGY: No results found for this or any previous visit (from the past 240 hour(s)).  Studies/Results: Dg Chest 2 View  Result Date: 02/03/2018 CLINICAL DATA:  Worsening cough and chest pain over the past week. EXAM: CHEST - 2 VIEW COMPARISON:  Chest x-ray dated October 26, 2017. FINDINGS: The heart size and mediastinal contours are within normal limits. Increased interstitial markings throughout the left lung and at the right lung base. New small right pleural effusion. Patchy opacity in the left lower lobe. Additional small nodular opacities in the left lower and right upper lobes. No pneumothorax. No acute osseous abnormality. IMPRESSION: 1. Left lower lobe pneumonia. Additional small nodular opacities in the left lower and right upper lobes may reflect additional sites of atypical infection or inflammation. Consider chest CT for further evaluation or followup PA and lateral chest X-ray in 3-4 weeks following trial of antibiotic therapy to ensure resolution. 2. Small right pleural effusion. Electronically Signed   By: Titus Dubin  M.D.   On: 02/03/2018 19:28   Ct Chest Wo Contrast  Result Date: 02/04/2018 CLINICAL DATA:  Acute onset of cough and congestion. EXAM: CT CHEST WITHOUT CONTRAST TECHNIQUE: Multidetector CT imaging of the chest was performed following the standard protocol without IV contrast. COMPARISON:  Chest radiograph performed earlier today at 6:37 p.m., and CT of the thoracic spine performed 09/06/2015 FINDINGS: Cardiovascular: The heart is normal in size. Scattered calcification is noted along the thoracic aorta and proximal great vessels. Mediastinum/Nodes: A 1.2 cm subcarinal node is noted. A 1.2 cm aortopulmonary window node is also seen. No pericardial effusion is identified. The visualized portions of the thyroid gland are unremarkable. No axillary lymphadenopathy is seen. Severe tracheomalacia is noted. Lungs/Pleura: Multiple large masses are noted throughout both lungs, several of which are pleural based. These measure up to 7 cm in size. There is associated prominent ground-glass opacification, particularly at the left upper lobe, raising suspicion for underlying diffuse lb over hemorrhage. Findings are concerning for malignancy, possibly metastatic disease from an unknown primary. Alternately, this could reflect a severe atypical infectious process. A small right pleural effusion is noted. No pneumothorax is seen. Upper Abdomen: The visualized portions of the liver and spleen are unremarkable. Postoperative change is noted at the gastroesophageal junction. The visualized portions of the pancreas and adrenal glands are within normal limits. Musculoskeletal: No acute osseous abnormalities are identified. The visualized musculature is unremarkable in appearance. IMPRESSION: 1. Multiple large masses throughout both lungs, several of which are pleural based, measuring up to 7 cm in size. Associated prominent ground-glass opacification, particularly at the left upper lobe, raising suspicion for underlying diffuse  alveolar hemorrhage. Findings concerning for malignancy, possibly metastatic disease from an unknown primary. Alternatively, this could reflect a severe atypical infectious process. Tissue diagnosis or cytology from the patient's right-sided pleural effusion would be helpful, as deemed clinically appropriate. 2. Small right pleural effusion noted. 3. Severe tracheomalacia noted. 4. Mildly enlarged mediastinal nodes, measuring up to 1.2 cm in short axis. This could reflect metastatic disease or sequelae of infection. Electronically Signed  By: Garald Balding M.D.   On: 02/04/2018 00:04    Medications:  Prior to Admission:  Medications Prior to Admission  Medication Sig Dispense Refill Last Dose  . acetaminophen (TYLENOL) 500 MG tablet Take 500 mg by mouth every 6 (six) hours as needed. For pain    Taking  . albuterol (VENTOLIN HFA) 108 (90 BASE) MCG/ACT inhaler Inhale 2 puffs into the lungs every 6 (six) hours as needed for wheezing or shortness of breath. For shortness of breath    unknown  . Biotin 10000 MCG TABS Take 1 capsule by mouth daily.   Taking  . buPROPion (WELLBUTRIN XL) 300 MG 24 hr tablet Take 300 mg by mouth daily.   unknown  . citalopram (CELEXA) 10 MG tablet Take 10 mg by mouth daily.   Taking  . ferrous sulfate 325 (65 FE) MG tablet Take 325 mg by mouth daily with breakfast.   Taking  . FLUoxetine (PROZAC) 20 MG capsule Take 40 mg by mouth daily.    unknown  . fluticasone (FLONASE) 50 MCG/ACT nasal spray Place 2 sprays into both nostrils daily.    10/28/2017 at Unknown time  . fluticasone-salmeterol (ADVAIR HFA) 230-21 MCG/ACT inhaler Inhale 2 puffs into the lungs 2 (two) times daily.   unknown  . furosemide (LASIX) 40 MG tablet Take 40 mg by mouth daily.   Taking  . HYDROcodone-acetaminophen (NORCO) 10-325 MG tablet Norco 10 mg-325 mg tablet  Take 1 tablet 4 times a day by oral route as needed.   unknown  . lamoTRIgine (LAMICTAL) 150 MG tablet Take 300 mg by mouth daily.     unknown  . latanoprost (XALATAN) 0.005 % ophthalmic solution Place 1 drop into both eyes at bedtime.   unknown  . levothyroxine (SYNTHROID, LEVOTHROID) 50 MCG tablet Take 50 mcg by mouth daily.     unknown  . losartan (COZAAR) 50 MG tablet Take 50 mg by mouth daily.    unknown  . meclizine (ANTIVERT) 25 MG tablet Take 25 mg by mouth 3 (three) times daily as needed for dizziness.    unknown  . metoprolol succinate (TOPROL-XL) 50 MG 24 hr tablet Take 50 mg by mouth daily.   Not Taking at Unknown time  . mirtazapine (REMERON) 7.5 MG tablet mirtazapine 7.5 mg tablet     . montelukast (SINGULAIR) 10 MG tablet Take 10 mg by mouth daily.   unknown  . moxifloxacin 0.1% INJ moxifloxacin 0.5 % eye drops     . Multiple Vitamins-Minerals (PRESERVISION/LUTEIN PO) Take 1 tablet by mouth daily.   Taking  . oxyCODONE-acetaminophen (PERCOCET/ROXICET) 5-325 MG tablet Take 1-2 tablets by mouth every 8 (eight) hours as needed for severe pain. 12 tablet 0   . pantoprazole (PROTONIX) 40 MG tablet TAKE 1 TABLET EVERY DAY 90 tablet 0 Taking  . polyethylene glycol (MIRALAX / GLYCOLAX) packet Take 17 g by mouth daily.   Not Taking at Unknown time  . pravastatin (PRAVACHOL) 40 MG tablet Take 40 mg by mouth daily.   unknown  . tiZANidine (ZANAFLEX) 4 MG tablet Take 4 mg by mouth 2 (two) times daily as needed for muscle spasms.   unknown  . triamcinolone cream (KENALOG) 0.5 % Apply 1 application topically 2 (two) times daily.    unknown   Scheduled: . budesonide (PULMICORT) nebulizer solution  0.5 mg Nebulization BID  . buPROPion  300 mg Oral Daily  . dextromethorphan-guaiFENesin  1 tablet Oral BID  . doxycycline  100 mg Oral Q12H  .  FLUoxetine  40 mg Oral Daily  . ipratropium  0.5 mg Nebulization Q6H  . levalbuterol  1.25 mg Nebulization Q6H  . levothyroxine  50 mcg Oral Q0600  . methylPREDNISolone (SOLU-MEDROL) injection  60 mg Intravenous Q6H  . montelukast  10 mg Oral QHS  . pravastatin  40 mg Oral q1800    Continuous: . sodium chloride 100 mL/hr at 02/04/18 0125  . cefTRIAXone (ROCEPHIN)  IV    . lactated ringers     IYM:EBRAXENMMHWKG **OR** acetaminophen, hydrALAZINE, oxyCODONE-acetaminophen, prochlorperazine  Assesment: She has community-acquired pneumonia which is multifocal.  She has mass lesions and I will review her CT with radiology considering the fact that she had CT of the chest done in October that was read as mass lesions related to multiple subacute right rib fractures.  Agree with antibiotics IV steroids inhaled bronchodilators  She has asthma and is on appropriate treatment  He may need biopsy of lesions but it would help if we could do some sort of direct comparison to the CT done at [facility] 11/08/2017  I do not think she has alveolar hemorrhage syndrome  She has bipolar disease and seems to be confused Principal Problem:   CAP (community acquired pneumonia) Active Problems:   Hypothyroid   Asthma   Chronic pain syndrome   Depression   Anemia   GERD (gastroesophageal reflux disease)   Glaucoma   Hypertension   AKI (acute kidney injury) ([place])   Acute respiratory failure with hypoxia (HCC)   Lobar pneumonia (HCC)   Asthma, chronic, unspecified asthma severity, with acute exacerbation   Leukocytosis   Lung mass    Plan: Continue current treatments.  Antibiotics steroids inhaled bronchodilators and follow.  Thanks for allowing me to see her with you    LOS: 1 day   Alonza Bogus 02/04/2018, 8:17 AM

## 2018-02-04 NOTE — Consult Note (Signed)
[facility] Consultation Oncology  Name: NOMA QUIJAS      MRN: [id number]    Location: G401/U272-53  Date: 02/04/2018 Time:4:38 PM   REFERRING PHYSICIAN: Dr. Carles Collet  REASON FOR CONSULT: Multiple lung masses.   DIAGNOSIS: Pleural-based lung masses, to rule out malignancy.  HISTORY OF PRESENT ILLNESS: 73 year old very pleasant white female who came to the emergency room because of increasing cough, congestion and shortness of breath, fever and confusion.  She has history of asthma and uses inhalers at home.  Chest x-ray in the ER showed multifocal pneumonia.  CT scan of the chest without contrast on 02/03/2018 showed multiple large masses throughout both lungs, several of which are pleural-based.  These measure up to 7 cm in size.  There is associated prominent groundglass opacification, particularly at the left upper lobe, raising suspicion for underlying diffuse alveolar hemorrhage.  As this was concerning for metastatic disease or other malignancy, we were consulted.  She denies any recent weight loss.  She was a never smoker but had a lifelong exposure to passive smoking.  Her brother reportedly had cancer to the bones.  She denies any personal history of malignancy.  She reportedly has all the screenings done on regular basis.  She reports normal mammogram and colonoscopy.  She reportedly had a CT scan of the chest done in [place] in September 2019.  I do not have access to those films. She reportedly lives in [place] by herself at her house.  1 of the sons who lives in [place] is at the bedside today.  Another son lives in [place].  She is independent of all her activities.  PAST MEDICAL HISTORY:   Past Medical History:  Diagnosis Date  . Arthritis   . Asthma   . Back pain   . Chronic pain syndrome 07/10/2013  . Depression   . Duodenal papillary stenosis   . Dyspnea   . Esophageal dysmotility   . Fibromyalgia   . GERD (gastroesophageal reflux disease)   . Glaucoma   .  H/O wheezing   . History of hiatal hernia   . History of palpitations   . HOH (hard of hearing)   . HOH (hard of hearing)   . Hypertension   . Hypothyroidism   . Macular degeneration   . Orthopnea   . Stenosis, spinal, lumbar   . Thyroid disease   . Tubular adenoma   . 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(HCC)     ALLERGIES: Allergies  Allergen Reactions  . Abilify [Aripiprazole]   . Geodon [Ziprasidone Hcl]   . Quetiapine Other (See Comments)  . Trazodone And Nefazodone   . Sulfa Antibiotics Rash      MEDICATIONS: I have reviewed the patient's current medications.     PAST SURGICAL HISTORY Past Surgical History:  Procedure Laterality Date  . CATARACT EXTRACTION W/PHACO Right 10/06/2016   Procedure: CATARACT EXTRACTION PHACO AND INTRAOCULAR LENS PLACEMENT (IOC);  Surgeon: Birder Robson, MD;  Location: [facility] ORS;  Service: Ophthalmology;  Laterality: Right;  [place] 00:32.5 AP% 14.5 CDE 4.71 Fluid pack lot # [id number]  . CATARACT EXTRACTION W/PHACO Left 11/03/2016   Procedure: CATARACT EXTRACTION PHACO AND INTRAOCULAR LENS PLACEMENT (IOC);  Surgeon: Birder Robson, MD;  Location: [facility] ORS;  Service: Ophthalmology;  Laterality: Left;  [place] 00:36.6 AP% 16.0 CDE 5.86 Fluid Pack lot # [id number]  . COCCYX REMOVAL    . colonoscopy  2005   Dr. Laural Golden: mild melanosis coli, otherwise normal  . COLONOSCOPY WITH PROPOFOL N/A  09/14/2013   GNO:IBBCWUGQB coli. Colonic diverticulosis. Single colonic. Tubular adenoma. Next TCS 09/2020.  [name] ERCP  1997   [facility]: biliary manometry abnormal, subsequent sphincterotomy   . ESOPHAGEAL MANOMETRY N/A 12/21/2016   Procedure: ESOPHAGEAL MANOMETRY (EM);  Surgeon: Mauri Pole, MD;  Location: [facility];  Service: Endoscopy;  Laterality: N/A;  . ESOPHAGOGASTRODUODENOSCOPY (EGD) WITH PROPOFOL N/A 09/14/2013   VQX:[id number]'C ring. Hiatal hernia. Status post Venia Minks and biopsy disruption.   [name] FOOT SURGERY     X2  . HEMORROIDECTOMY    . HERNIA REPAIR      inguinal right  . Ceres N/A 09/14/2013   Procedure: Venia Minks DILATION;  Surgeon: [name], MD;  Location: [facility];  Service: Endoscopy;  Laterality: N/A;  56  . POLYPECTOMY N/A 09/14/2013   Procedure: POLYPECTOMY;  Surgeon: [name], MD;  Location: [facility];  Service: Endoscopy;  Laterality: N/A;  . TONSILLECTOMY      FAMILY HISTORY: Family History  Problem Relation Age of Onset  . Lung cancer Mother   . Heart attack Father   . Bone cancer Brother   . COPD Sister   . Colon cancer Neg Hx     SOCIAL HISTORY:  reports that she has never smoked. She has never used smokeless tobacco. She reports that she does not drink alcohol or use drugs.  PERFORMANCE STATUS: The patient's performance status is 2 - Symptomatic, <50% confined to bed  PHYSICAL EXAM: Most Recent Vital Signs: Blood pressure 138/78, pulse (!) 108, temperature 97.8 F (36.6 C), temperature source Oral, resp. rate 19, height 5' (1.524 m), weight 152 lb (68.9 kg), SpO2 97 %. BP 138/78   Pulse (!) 108   Temp 97.8 F (36.6 C) (Oral)   Resp 19   Ht 5' (1.524 m)   Wt 152 lb (68.9 kg)   SpO2 97%   BMI 29.69 kg/m  General appearance: alert, cooperative and appears stated age Lungs: Bilateral air entry, decreased breath sounds at bases. Heart: regular rate and rhythm Abdomen: soft, non-tender; bowel sounds normal; no masses,  no organomegaly Extremities: extremities normal, atraumatic, no cyanosis or edema Lymph nodes: Cervical, supraclavicular, and axillary nodes normal. Neurologic: Grossly normal  LABORATORY DATA:  Results for orders placed or performed during the hospital encounter of 02/03/18 (from the past 48 hour(s))  CBC with Differential     Status: Abnormal   Collection Time: 02/03/18  7:24 PM  Result Value Ref Range   WBC 43.5 (H) 4.0 - 10.5 K/uL    Comment: WHITE COUNT CONFIRMED ON SMEAR   RBC 3.18 (L) 3.87 - 5.11 MIL/uL   Hemoglobin 9.9 (L) 12.0 - 15.0 g/dL   HCT  31.9 (L) 36.0 - 46.0 %   MCV 100.3 (H) 80.0 - 100.0 fL   MCH 31.1 26.0 - 34.0 pg   MCHC 31.0 30.0 - 36.0 g/dL   RDW 14.4 11.5 - 15.5 %   Platelets  150 - 400 K/uL    PLATELET CLUMPS NOTED ON SMEAR, COUNT APPEARS ADEQUATE   nRBC 0.1 0.0 - 0.2 %   Neutrophils Relative % 87 %   Neutro Abs 38.4 (H) 1.7 - 7.7 K/uL   Lymphocytes Relative 3 %   Lymphs Abs 1.1 0.7 - 4.0 K/uL   Monocytes Relative 4 %   Monocytes Absolute 1.5 (H) 0.1 - 1.0 K/uL   Eosinophils Relative 2 %   Eosinophils Absolute 0.7 (H) 0.0 - 0.5 K/uL  Basophils Relative 0 %   Basophils Absolute 0.1 0.0 - 0.1 K/uL   WBC Morphology INCREASED BANDS (>20% BANDS)     Comment: TOXIC GRANULATION VACUOLATED NEUTROPHILS    Immature Granulocytes 4 %   Abs Immature Granulocytes 1.61 (H) 0.00 - 0.07 K/uL    Comment: Performed at [facility], [address], [place], [place] [address]  Basic metabolic panel     Status: Abnormal   Collection Time: 02/03/18  7:24 PM  Result Value Ref Range   Sodium 134 (L) 135 - 145 mmol/L   Potassium 4.7 3.5 - 5.1 mmol/L   Chloride 100 98 - 111 mmol/L   CO2 21 (L) 22 - 32 mmol/L   Glucose, Bld 91 70 - 99 mg/dL   BUN 36 (H) 8 - 23 mg/dL   Creatinine, Ser 2.02 (H) 0.44 - 1.00 mg/dL   Calcium 8.7 (L) 8.9 - 10.3 mg/dL   GFR calc non Af Amer 24 (L) >60 mL/min   GFR calc Af Amer 28 (L) >60 mL/min   Anion gap 13 5 - 15    Comment: Performed at [facility], [address] [place], [place] [address]  POCT i-Stat troponin I     Status: None   Collection Time: 02/03/18 10:45 PM  Result Value Ref Range   Troponin i, poc 0.07 0.00 - 0.08 ng/mL   Comment 3            Comment: Due to the release kinetics of cTnI, a negative result within the first hours of the onset of symptoms does not rule out myocardial infarction with certainty. If myocardial infarction is still suspected, repeat the test at appropriate intervals.   CG4 I-STAT (Lactic acid)     Status: None   Collection Time: 02/03/18 10:47 PM   Result Value Ref Range   Lactic Acid, Venous 1.12 0.5 - 1.9 mmol/L  Strep pneumoniae urinary antigen     Status: None   Collection Time: 02/04/18 12:52 AM  Result Value Ref Range   Strep Pneumo Urinary Antigen NEGATIVE NEGATIVE    Comment:        Infection due to S. pneumoniae cannot be absolutely ruled out since the antigen present may be below the detection limit of the test. Performed at [facility], [place] [address], [place],  [address]   CBC WITH DIFFERENTIAL     Status: Abnormal   Collection Time: 02/04/18  2:57 AM  Result Value Ref Range   WBC 38.8 (H) 4.0 - 10.5 K/uL   RBC 2.81 (L) 3.87 - 5.11 MIL/uL   Hemoglobin 8.6 (L) 12.0 - 15.0 g/dL   HCT 27.0 (L) 36.0 - 46.0 %   MCV 96.1 80.0 - 100.0 fL   MCH 30.6 26.0 - 34.0 pg   MCHC 31.9 30.0 - 36.0 g/dL   RDW 14.6 11.5 - 15.5 %   Platelets 356 150 - 400 K/uL   nRBC 0.1 0.0 - 0.2 %   Neutrophils Relative % 90 %   Neutro Abs 34.8 (H) 1.7 - 7.7 K/uL   Lymphocytes Relative 2 %   Lymphs Abs 0.8 0.7 - 4.0 K/uL   Monocytes Relative 4 %   Monocytes Absolute 1.3 (H) 0.1 - 1.0 K/uL   Eosinophils Relative 1 %   Eosinophils Absolute 0.5 0.0 - 0.5 K/uL   Basophils Relative 0 %   Basophils Absolute 0.1 0.0 - 0.1 K/uL   Immature Granulocytes 3 %   Abs Immature Granulocytes 1.23 (  H) 0.00 - 0.07 K/uL    Comment: Performed at [facility], [address], [place], [place] [address]  Comprehensive metabolic panel     Status: Abnormal   Collection Time: 02/04/18  2:57 AM  Result Value Ref Range   Sodium 136 135 - 145 mmol/L   Potassium 4.0 3.5 - 5.1 mmol/L   Chloride 107 98 - 111 mmol/L   CO2 20 (L) 22 - 32 mmol/L   Glucose, Bld 83 70 - 99 mg/dL   BUN 32 (H) 8 - 23 mg/dL   Creatinine, Ser 1.68 (H) 0.44 - 1.00 mg/dL   Calcium 7.9 (L) 8.9 - 10.3 mg/dL   Total Protein 5.3 (L) 6.5 - 8.1 g/dL   Albumin 2.1 (L) 3.5 - 5.0 g/dL   AST 34 15 - 41 U/L   ALT 37 0 - 44 U/L   Alkaline Phosphatase 152 (H) 38 - 126 U/L   Total  Bilirubin 1.1 0.3 - 1.2 mg/dL   GFR calc non Af Amer 30 (L) >60 mL/min   GFR calc Af Amer 35 (L) >60 mL/min   Anion gap 9 5 - 15    Comment: Performed at [facility], [address] [place], [place] [address]  Protime-INR     Status: Abnormal   Collection Time: 02/04/18  2:57 AM  Result Value Ref Range   Prothrombin Time 16.4 (H) 11.4 - 15.2 seconds   INR 1.34     Comment: Performed at [facility], [address] [place], [place] [address]  APTT     Status: Abnormal   Collection Time: 02/04/18  2:57 AM  Result Value Ref Range   aPTT 37 (H) 24 - 36 seconds    Comment:        IF BASELINE aPTT IS ELEVATED, SUGGEST PATIENT RISK ASSESSMENT BE USED TO DETERMINE APPROPRIATE ANTICOAGULANT THERAPY. Performed at [facility], [address] [place], [place] [address]   Reticulocytes     Status: Abnormal   Collection Time: 02/04/18  2:57 AM  Result Value Ref Range   Retic Ct Pct 1.0 0.4 - 3.1 %   RBC. 2.82 (L) 3.87 - 5.11 MIL/uL   Retic Count, Absolute 27.6 19.0 - 186.0 K/uL   Immature Retic Fract 24.1 (H) 2.3 - 15.9 %    Comment: Performed at [facility], [address], [place], [place] [address]  Folate     Status: None   Collection Time: 02/04/18  7:52 AM  Result Value Ref Range   Folate 8.6 >5.9 ng/mL    Comment: Performed at [facility], [address], [place], [place] [address]  Vitamin B12     Status: Abnormal   Collection Time: 02/04/18  7:52 AM  Result Value Ref Range   Vitamin B-12 989 (H) 180 - 914 pg/mL    Comment: (NOTE) This assay is not validated for testing neonatal or myeloproliferative syndrome specimens for Vitamin B12 levels. Performed at [facility], [address] [place], [place] [address]   Iron and TIBC     Status: Abnormal   Collection Time: 02/04/18  7:52 AM  Result Value Ref Range   Iron 6 (L) 28 - 170 ug/dL   TIBC 202 (L) 250 - 450 ug/dL   Saturation Ratios 3 (L) 10.4 - 31.8 %   UIBC 196 ug/dL    Comment: Performed at [facility], [address], [place], [place] [address]  Ferritin     Status: Abnormal   Collection Time: 02/04/18  7:52 AM  Result Value Ref Range   Ferritin 332 (H) 11 - 307 ng/mL    Comment: Performed at [facility], [address] [place], [place] [address]  Procalcitonin - Baseline     Status: None   Collection Time: 02/04/18  7:52 AM  Result Value Ref Range   Procalcitonin 10.16 ng/mL    Comment:        Interpretation: PCT >= 10 ng/mL: Important systemic inflammatory response, almost exclusively due to severe bacterial sepsis or septic shock. (NOTE)       Sepsis PCT Algorithm           Lower Respiratory Tract                                      Infection PCT Algorithm    ----------------------------     ----------------------------         PCT < 0.25 ng/mL                PCT < 0.10 ng/mL         Strongly encourage             Strongly discourage   discontinuation of antibiotics    initiation of antibiotics    ----------------------------     -----------------------------       PCT 0.25 - 0.50 ng/mL            PCT 0.10 - 0.25 ng/mL               OR       >80% decrease in PCT            Discourage initiation of                                            antibiotics      Encourage discontinuation           of antibiotics    ----------------------------     -----------------------------         PCT >= 0.50 ng/mL              PCT 0.26 - 0.50 ng/mL                AND       <80% decrease in PCT             Encourage initiation of                                             antibiotics       Encourage continuation           of antibiotics    ----------------------------     -----------------------------        PCT >= 0.50 ng/mL                  PCT > 0.50 ng/mL               AND         increase in PCT                  Strongly encourage  initiation of antibiotics    Strongly encourage escalation           of antibiotics                                      -----------------------------                                           PCT <= 0.25 ng/mL                                                 OR                                        > 80% decrease in PCT                                     Discontinue / Do not initiate                                             antibiotics Performed at [facility], [address] [place], [place] [address]   Expectorated sputum assessment w rflx to resp cult     Status: None   Collection Time: 02/04/18 11:04 AM  Result Value Ref Range   Specimen Description EXPECTORATED SPUTUM    Special Requests NONE    Sputum evaluation      Sputum specimen not acceptable for testing.  Please recollect.   Gram Stain Report Called to,Read Back By and Verified With: BULLINS L. AT 1211PM ON [id number] BY THOMPSON S. Performed at [facility], [address], [place], [place] [address]    Report Status 02/04/2018 FINAL       RADIOGRAPHY: Dg Chest 2 View  Result Date: 02/03/2018 CLINICAL DATA:  Worsening cough and chest pain over the past week. EXAM: CHEST - 2 VIEW COMPARISON:  Chest x-ray dated October 26, 2017. FINDINGS: The heart size and mediastinal contours are within normal limits. Increased interstitial markings throughout the left lung and at the right lung base. New small right pleural effusion. Patchy opacity in the left lower lobe. Additional small nodular opacities in the left lower and right upper lobes. No pneumothorax. No acute osseous abnormality. IMPRESSION: 1. Left lower lobe pneumonia. Additional small nodular opacities in the left lower and right upper lobes may reflect additional sites of atypical infection or inflammation. Consider chest CT for further evaluation or followup PA and lateral chest X-ray in 3-4 weeks following trial of antibiotic therapy to ensure resolution. 2. Small right pleural effusion. Electronically Signed   By: Titus Dubin M.D.   On: 02/03/2018 19:28   Ct Chest Wo Contrast  Result  Date: 02/04/2018 CLINICAL DATA:  Acute onset of cough and congestion. EXAM: CT CHEST WITHOUT CONTRAST TECHNIQUE: Multidetector CT imaging of the chest was performed following the standard protocol without IV contrast. COMPARISON:  Chest radiograph performed earlier today at 6:37 p.m., and CT of the thoracic spine performed 09/06/2015 FINDINGS: Cardiovascular: The heart is normal in size. Scattered calcification is noted along the thoracic aorta and proximal great vessels. Mediastinum/Nodes: A 1.2 cm subcarinal node is noted. A 1.2 cm aortopulmonary window node is also seen. No pericardial effusion is identified. The visualized portions of the thyroid gland are unremarkable. No axillary lymphadenopathy is seen. Severe tracheomalacia is noted. Lungs/Pleura: Multiple large masses are noted throughout both lungs, several of which are pleural based. These measure up to 7 cm in size. There is associated prominent ground-glass opacification, particularly at the left upper lobe, raising suspicion for underlying diffuse lb over hemorrhage. Findings are concerning for malignancy, possibly metastatic disease from an unknown primary. Alternately, this could reflect a severe atypical infectious process. A small right pleural effusion is noted. No pneumothorax is seen. Upper Abdomen: The visualized portions of the liver and spleen are unremarkable. Postoperative change is noted at the gastroesophageal junction. The visualized portions of the pancreas and adrenal glands are within normal limits. Musculoskeletal: No acute osseous abnormalities are identified. The visualized musculature is unremarkable in appearance. IMPRESSION: 1. Multiple large masses throughout both lungs, several of which are pleural based, measuring up to 7 cm in size. Associated prominent ground-glass opacification, particularly at the left upper lobe, raising suspicion for underlying diffuse alveolar hemorrhage. Findings concerning for malignancy, possibly  metastatic disease from an unknown primary. Alternatively, this could reflect a severe atypical infectious process. Tissue diagnosis or cytology from the patient's right-sided pleural effusion would be helpful, as deemed clinically appropriate. 2. Small right pleural effusion noted. 3. Severe tracheomalacia noted. 4. Mildly enlarged mediastinal nodes, measuring up to 1.2 cm in short axis. This could reflect metastatic disease or sequelae of infection. Electronically Signed   By: Garald Balding M.D.   On: 02/04/2018 00:04       ASSESSMENT and PLAN:  1.  Multiple lung masses: - Patient admitted with pneumonia. - A CT scan of the chest without contrast on 02/03/2018 showed multiple large masses throughout both lungs, several of which are pleural-based, measuring up to 7 cm in size.  Left upper lobe GGO, raising suspicion for underlying diffuse alveolar hemorrhage.  Mildly enlarged mediastinal lymph nodes measuring up to 1.2 cm in short axis.  This could reflect metastatic disease or sequela of infection.  Patient denies any exposure of asbestos.  She was never smoker.  She had secondhand smoke exposure.  She had a prolonged history of asthma and uses inhalers. - Patient receiving antibiotics for pneumonia.  She is on oxygen via nasal cannula. - I had a prolonged discussion with the patient and her son who is at bedside.  I think it is reasonable to wait until her infection is cleared and her breathing improved prior to attempting biopsy. -I will be glad to see her in my office upon discharge.  She could have a PET CT scan as outpatient and have a biopsy done.  2.  Leukocytosis: - She has leukemoid reaction which is improving.  It is predominantly neutrophilic leukocytosis.  All questions were answered. The patient knows to call the clinic with any problems, questions or concerns. We can certainly see the patient much sooner if necessary.   Derek Jack

## 2018-02-04 NOTE — Progress Notes (Signed)
Pt sleeping, call light within reach, bed alarm on,continue to monitor.

## 2018-02-04 NOTE — Progress Notes (Signed)
PROGRESS NOTE  Heidi Fuentes KGU:[id number] DOB: [birth year] DOA: 02/03/2018 PCP: Orpah Melter, MD  Brief History:  73 year old female with a history of chronic back pain, bipolar disorder, fibromyalgia, hypertension, hypothyroidism, esophageal dysmotility, Schatzki's ring presented with 1 week history of worsening cough and shortness of breath.  The patient states that she has been using her nebulizers and inhalers without much help.  She has had coughing with green sputum occasionally that is blood-tinged.  She has had subjective fevers and chills.  She denies any nausea, vomiting, diarrhea, abdominal pain, dysuria, hematuria.  She denies any hematochezia, melena, hematuria.  The patient is very tangential regarding her answers and thought process.  She states her oral intake is been poor.  Upon presentation, the patient was noted to have WBC 43.5 with bandemia.  Chest x-ray showed nodular opacities in the left lower lobe and right upper lobe and increasing interstitial markings in the left lung.  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  There was also a right small effusion and prominent groundglass opacification left upper lobe.  The patient was started on ceftriaxone and doxycycline.  Assessment/Plan: Acute respiratory failure with hypoxia -Secondary to pneumonia and asthma exacerbation -Presently stable on 3 L -Wean oxygen as tolerated for saturation greater than 92% -Pulmonary hygiene  Lobar pneumonia -Continue ceftriaxone and doxycycline -Check procalcitonin -Urine Legionella antigen -Urine Streptococcus pneumoniae antigen  Lung masses -02/04/2018 CT of the chest--multiple bilateral large pleural-based masses up to 7 cm with associated prominent groundglass opacification left upper lobe -Pulmonary consult -Request IR biopsy once the patient's respiratory status is improving  Acute asthma exacerbation -Start  Pulmicort -Start Solu-Medrol -Continue Xopenex and Atrovent  AKI -Likely due to infectious process and volume depletion -Baseline creatinine 0.7-2.9 -Continue IV fluids -Serum creatinine peaked at 2.02 -Holding furosemide  Bipolar disorder -Restart Wellbutrin -Restart other psychotropic medications once medications have been reconciled    Chronic pain syndrome -Continue home dose of Percocet  Hypothyroidism -Continue Synthroid  Essential hypertension -Holding losartan in the setting of AKI -Hydralazine as needed SBP greater than 180  Hyperlipidemia -Continue pravastatin  Macrocytic anemia -Check anemia panel -FOBT -Hemoglobin has dropped 3 g in the last 3 months     Disposition Plan:   Home in 2-3 days  Family Communication:   Son updated on phone 1/3  Consultants: Pulmonary, hematology/oncology, IR  Code Status:  FULL   DVT Prophylaxis:  SCDs   Procedures: As Listed in Progress Note Above  Antibiotics: Ceftriaxone 1/2>>> Doxycycline1/2>>>       Subjective: Patient denies any fevers, chills, nausea, vomiting, diarrhea, abdominal pain, dysuria, hematuria patient complains of cough with yellow-green sputum.  Occasionally it is blood-tinged.  She has pleuritic chest pain with coughing.  She denies any headache or neck pain.  Objective: Vitals:   02/03/18 2300 02/03/18 2334 02/04/18 0448 02/04/18 0618  BP:  (!) 177/90 (!) 126/59   Pulse: 96 (!) 128 98   Resp: 20     Temp:  98.2 F (36.8 C) 98 F (36.7 C)   TempSrc:  Oral Oral   SpO2: 93% 93% 96% 98%  Weight:      Height:       No intake or output data in the 24 hours ending 02/04/18 0730 Weight change:  Exam:   General:  Pt is alert, follows commands appropriately, not in acute distress  HEENT: No icterus,  No thrush, No neck mass, [place]/AT  Cardiovascular: RRR, S1/S2, no rubs, no gallops  Respiratory: Bilateral rales bilateral expiratory wheeze  Abdomen: Soft/+BS, non tender, non  distended, no guarding  Extremities: 1+ lower extremity edema, No lymphangitis, No petechiae, No rashes, no synovitis   Data Reviewed: I have personally reviewed following labs and imaging studies Basic Metabolic Panel: Recent Labs  Lab 02/03/18 1924 02/04/18 0257  NA 134* 136  K 4.7 4.0  CL 100 107  CO2 21* 20*  GLUCOSE 91 83  BUN 36* 32*  CREATININE 2.02* 1.68*  CALCIUM 8.7* 7.9*   Liver Function Tests: Recent Labs  Lab 02/04/18 0257  AST 34  ALT 37  ALKPHOS 152*  BILITOT 1.1  PROT 5.3*  ALBUMIN 2.1*   No results for input(s): LIPASE, AMYLASE in the last 168 hours. No results for input(s): AMMONIA in the last 168 hours. Coagulation Profile: Recent Labs  Lab 02/04/18 0257  INR 1.34   CBC: Recent Labs  Lab 02/03/18 1924 02/04/18 0257  WBC 43.5* 38.8*  NEUTROABS 38.4* 34.8*  HGB 9.9* 8.6*  HCT 31.9* 27.0*  MCV 100.3* 96.1  PLT PLATELET CLUMPS NOTED ON SMEAR, COUNT APPEARS ADEQUATE 356   Cardiac Enzymes: No results for input(s): CKTOTAL, CKMB, CKMBINDEX, TROPONINI in the last 168 hours. BNP: Invalid input(s): POCBNP CBG: No results for input(s): GLUCAP in the last 168 hours. HbA1C: No results for input(s): HGBA1C in the last 72 hours. Urine analysis:    Component Value Date/Time   COLORURINE YELLOW 10/26/2017 2030   APPEARANCEUR CLEAR 10/26/2017 2030   APPEARANCEUR Cloudy 06/02/2012 1042   LABSPEC 1.019 10/26/2017 2030   LABSPEC 1.017 06/02/2012 1042   PHURINE 6.0 10/26/2017 2030   GLUCOSEU NEGATIVE 10/26/2017 2030   GLUCOSEU Negative 06/02/2012 1042   HGBUR NEGATIVE 10/26/2017 2030   Joseph NEGATIVE 10/26/2017 2030   BILIRUBINUR Negative 06/02/2012 1042   KETONESUR 80 (A) 10/26/2017 2030   PROTEINUR 30 (A) 10/26/2017 2030   UROBILINOGEN 0.2 12/11/2014 1429   NITRITE NEGATIVE 10/26/2017 2030   LEUKOCYTESUR NEGATIVE 10/26/2017 2030   LEUKOCYTESUR 3+ 06/02/2012 1042   Sepsis Labs: @LABRCNTIP (procalcitonin:4,lacticidven:4) )No results  found for this or any previous visit (from the past 240 hour(s)).   Scheduled Meds: . dextromethorphan-guaiFENesin  1 tablet Oral BID  . doxycycline  100 mg Oral Q12H  . ipratropium  0.5 mg Nebulization Q6H  . levalbuterol  1.25 mg Nebulization Q6H   Continuous Infusions: . sodium chloride 100 mL/hr at 02/04/18 0125  . cefTRIAXone (ROCEPHIN)  IV    . lactated ringers      Procedures/Studies: Dg Chest 2 View  Result Date: 02/03/2018 CLINICAL DATA:  Worsening cough and chest pain over the past week. EXAM: CHEST - 2 VIEW COMPARISON:  Chest x-ray dated October 26, 2017. FINDINGS: The heart size and mediastinal contours are within normal limits. Increased interstitial markings throughout the left lung and at the right lung base. New small right pleural effusion. Patchy opacity in the left lower lobe. Additional small nodular opacities in the left lower and right upper lobes. No pneumothorax. No acute osseous abnormality. IMPRESSION: 1. Left lower lobe pneumonia. Additional small nodular opacities in the left lower and right upper lobes may reflect additional sites of atypical infection or inflammation. Consider chest CT for further evaluation or followup PA and lateral chest X-ray in 3-4 weeks following trial of antibiotic therapy to ensure resolution. 2. Small right pleural effusion. Electronically Signed   By: Orville Govern.D.  On: 02/03/2018 19:28   Ct Head Wo Contrast  Result Date: 01/07/2018 CLINICAL DATA:  73 year old female status post fall this morning getting out of bed. Struck left eye on nightstand. Bruising and painful extraocular movements. Dizziness, nausea, headache. EXAM: CT HEAD WITHOUT CONTRAST TECHNIQUE: Contiguous axial images were obtained from the base of the skull through the vertex without intravenous contrast. COMPARISON:  Head CT without contrast 10/26/2017. Orbit CT today reported separately. FINDINGS: Brain: Cerebral volume is stable and normal for age. Stable  gray-white matter differentiation, mild for age white matter hypodensity appears stable. No midline shift, ventriculomegaly, mass effect, evidence of mass lesion, intracranial hemorrhage or evidence of cortically based acute infarction. Vascular: Calcified atherosclerosis at the skull base. No suspicious intracranial vascular hyperdensity. Skull: No calvarium fracture. Sinuses/Orbits: Abnormal left maxillary sinus, see orbit series today. Other paranasal sinuses and mastoids are stable and well pneumatized. Other: No scalp hematoma identified. Orbit findings today reported separately. IMPRESSION: 1. See Orbit CT findings today reported separately. 2. Stable and normal for age noncontrast CT appearance of the brain. Electronically Signed   By: Genevie Ann M.D.   On: 01/07/2018 11:53   Ct Chest Wo Contrast  Result Date: 02/04/2018 CLINICAL DATA:  Acute onset of cough and congestion. EXAM: CT CHEST WITHOUT CONTRAST TECHNIQUE: Multidetector CT imaging of the chest was performed following the standard protocol without IV contrast. COMPARISON:  Chest radiograph performed earlier today at 6:37 p.m., and CT of the thoracic spine performed 09/06/2015 FINDINGS: Cardiovascular: The heart is normal in size. Scattered calcification is noted along the thoracic aorta and proximal great vessels. Mediastinum/Nodes: A 1.2 cm subcarinal node is noted. A 1.2 cm aortopulmonary window node is also seen. No pericardial effusion is identified. The visualized portions of the thyroid gland are unremarkable. No axillary lymphadenopathy is seen. Severe tracheomalacia is noted. Lungs/Pleura: Multiple large masses are noted throughout both lungs, several of which are pleural based. These measure up to 7 cm in size. There is associated prominent ground-glass opacification, particularly at the left upper lobe, raising suspicion for underlying diffuse lb over hemorrhage. Findings are concerning for malignancy, possibly metastatic disease from an  unknown primary. Alternately, this could reflect a severe atypical infectious process. A small right pleural effusion is noted. No pneumothorax is seen. Upper Abdomen: The visualized portions of the liver and spleen are unremarkable. Postoperative change is noted at the gastroesophageal junction. The visualized portions of the pancreas and adrenal glands are within normal limits. Musculoskeletal: No acute osseous abnormalities are identified. The visualized musculature is unremarkable in appearance. IMPRESSION: 1. Multiple large masses throughout both lungs, several of which are pleural based, measuring up to 7 cm in size. Associated prominent ground-glass opacification, particularly at the left upper lobe, raising suspicion for underlying diffuse alveolar hemorrhage. Findings concerning for malignancy, possibly metastatic disease from an unknown primary. Alternatively, this could reflect a severe atypical infectious process. Tissue diagnosis or cytology from the patient's right-sided pleural effusion would be helpful, as deemed clinically appropriate. 2. Small right pleural effusion noted. 3. Severe tracheomalacia noted. 4. Mildly enlarged mediastinal nodes, measuring up to 1.2 cm in short axis. This could reflect metastatic disease or sequelae of infection. Electronically Signed   By: Garald Balding M.D.   On: 02/04/2018 00:04   Ct Orbits Wo Contrast  Result Date: 01/07/2018 CLINICAL DATA:  73 year old female status post fall out of bed, struck left eye on nightstand. Painful orbit range of motion. EXAM: CT ORBITS WITHOUT CONTRAST TECHNIQUE: Multidetector CT images were  obtained using the standard protocol without intravenous contrast. COMPARISON:  Head CT today reported separately. Head and cervical spine CT 09/06/2015. FINDINGS: Orbits: Wide fracture of the left orbital floor (about 10 millimeters across) with prominent 15 millimeter focus of herniated intraorbital fat and a portion of the inferior rectus  muscle is also mildly herniated (series 12, image 23). The left lamina papyracea, superior and lateral orbital walls remain intact. The left globe is intact. There is a mild degree of left intraorbital fat stranding, perhaps soft tissue edema. No discrete hematoma within the orbit. The contralateral right orbit appears intact and negative. Visualized sinuses: Moderate hemorrhage level within the left maxillary sinus. The visible left maxilla appears intact aside from the orbital floor fracture. Likewise, intact visible zygoma and central skull base. There is mild fluid or mucosal thickening in a posterior left ethmoid air cell but otherwise the paranasal sinuses are clear. Soft tissues: Mild superficial left periorbital soft tissue contusion or hematoma. No subcutaneous gas. Limited intracranial: Reported separately today. IMPRESSION: 1. Wide fracture of the left orbital floor with herniated 15 mm section of intraorbital fat and a portion of the inferior rectus muscle. Additional left intraorbital soft tissue edema suspected. No discrete intraorbital hematoma. Hemorrhage layering in the left maxillary sinus. Mild left periorbital soft tissue contusion or hematoma. 2. No other acute fracture identified. 3. Head CT reported separately. Electronically Signed   By: Genevie Ann M.D.   On: 01/07/2018 12:02    Orson Eva, DO  [facility] Pager [phone]  If 7PM-7AM, please contact night-coverage [email or web address] Password [email or web address] 02/04/2018, 7:30 AM   LOS: 1 day

## 2018-02-05 ENCOUNTER — Other Ambulatory Visit: Payer: Self-pay

## 2018-02-05 ENCOUNTER — Inpatient Hospital Stay (HOSPITAL_COMMUNITY): Payer: Medicare Other

## 2018-02-05 DIAGNOSIS — D638 Anemia in other chronic diseases classified elsewhere: Secondary | ICD-10-CM | POA: Diagnosis present

## 2018-02-05 DIAGNOSIS — Z888 Allergy status to other drugs, medicaments and biological substances status: Secondary | ICD-10-CM | POA: Diagnosis not present

## 2018-02-05 DIAGNOSIS — G8929 Other chronic pain: Secondary | ICD-10-CM | POA: Diagnosis present

## 2018-02-05 DIAGNOSIS — R279 Unspecified lack of coordination: Secondary | ICD-10-CM | POA: Diagnosis not present

## 2018-02-05 DIAGNOSIS — Z743 Need for continuous supervision: Secondary | ICD-10-CM | POA: Diagnosis not present

## 2018-02-05 DIAGNOSIS — J15212 Pneumonia due to Methicillin resistant Staphylococcus aureus: Secondary | ICD-10-CM | POA: Diagnosis present

## 2018-02-05 DIAGNOSIS — J9600 Acute respiratory failure, unspecified whether with hypoxia or hypercapnia: Secondary | ICD-10-CM | POA: Diagnosis not present

## 2018-02-05 DIAGNOSIS — R042 Hemoptysis: Secondary | ICD-10-CM | POA: Diagnosis not present

## 2018-02-05 DIAGNOSIS — R918 Other nonspecific abnormal finding of lung field: Secondary | ICD-10-CM | POA: Diagnosis present

## 2018-02-05 DIAGNOSIS — D649 Anemia, unspecified: Secondary | ICD-10-CM | POA: Diagnosis not present

## 2018-02-05 DIAGNOSIS — J189 Pneumonia, unspecified organism: Secondary | ICD-10-CM | POA: Diagnosis not present

## 2018-02-05 DIAGNOSIS — R61 Generalized hyperhidrosis: Secondary | ICD-10-CM | POA: Diagnosis present

## 2018-02-05 DIAGNOSIS — E785 Hyperlipidemia, unspecified: Secondary | ICD-10-CM | POA: Diagnosis present

## 2018-02-05 DIAGNOSIS — I10 Essential (primary) hypertension: Secondary | ICD-10-CM | POA: Diagnosis present

## 2018-02-05 DIAGNOSIS — J4541 Moderate persistent asthma with (acute) exacerbation: Secondary | ICD-10-CM | POA: Diagnosis not present

## 2018-02-05 DIAGNOSIS — E878 Other disorders of electrolyte and fluid balance, not elsewhere classified: Secondary | ICD-10-CM | POA: Diagnosis not present

## 2018-02-05 DIAGNOSIS — F319 Bipolar disorder, unspecified: Secondary | ICD-10-CM | POA: Diagnosis present

## 2018-02-05 DIAGNOSIS — J811 Chronic pulmonary edema: Secondary | ICD-10-CM | POA: Diagnosis present

## 2018-02-05 DIAGNOSIS — R5381 Other malaise: Secondary | ICD-10-CM | POA: Diagnosis not present

## 2018-02-05 DIAGNOSIS — J948 Other specified pleural conditions: Secondary | ICD-10-CM | POA: Diagnosis not present

## 2018-02-05 DIAGNOSIS — J151 Pneumonia due to Pseudomonas: Secondary | ICD-10-CM | POA: Diagnosis present

## 2018-02-05 DIAGNOSIS — R05 Cough: Secondary | ICD-10-CM | POA: Diagnosis not present

## 2018-02-05 DIAGNOSIS — E039 Hypothyroidism, unspecified: Secondary | ICD-10-CM | POA: Diagnosis present

## 2018-02-05 DIAGNOSIS — R0602 Shortness of breath: Secondary | ICD-10-CM | POA: Diagnosis not present

## 2018-02-05 DIAGNOSIS — D72829 Elevated white blood cell count, unspecified: Secondary | ICD-10-CM | POA: Diagnosis not present

## 2018-02-05 DIAGNOSIS — E8770 Fluid overload, unspecified: Secondary | ICD-10-CM | POA: Diagnosis present

## 2018-02-05 DIAGNOSIS — J45901 Unspecified asthma with (acute) exacerbation: Secondary | ICD-10-CM | POA: Diagnosis present

## 2018-02-05 DIAGNOSIS — K219 Gastro-esophageal reflux disease without esophagitis: Secondary | ICD-10-CM | POA: Diagnosis present

## 2018-02-05 DIAGNOSIS — J9601 Acute respiratory failure with hypoxia: Secondary | ICD-10-CM | POA: Diagnosis not present

## 2018-02-05 DIAGNOSIS — M549 Dorsalgia, unspecified: Secondary | ICD-10-CM | POA: Diagnosis present

## 2018-02-05 DIAGNOSIS — R2681 Unsteadiness on feet: Secondary | ICD-10-CM | POA: Diagnosis not present

## 2018-02-05 DIAGNOSIS — R590 Localized enlarged lymph nodes: Secondary | ICD-10-CM | POA: Diagnosis present

## 2018-02-05 DIAGNOSIS — N179 Acute kidney failure, unspecified: Secondary | ICD-10-CM | POA: Diagnosis not present

## 2018-02-05 DIAGNOSIS — R630 Anorexia: Secondary | ICD-10-CM | POA: Diagnosis present

## 2018-02-05 DIAGNOSIS — J90 Pleural effusion, not elsewhere classified: Secondary | ICD-10-CM | POA: Diagnosis not present

## 2018-02-05 DIAGNOSIS — J918 Pleural effusion in other conditions classified elsewhere: Secondary | ICD-10-CM | POA: Diagnosis present

## 2018-02-05 DIAGNOSIS — R1312 Dysphagia, oropharyngeal phase: Secondary | ICD-10-CM | POA: Diagnosis not present

## 2018-02-05 DIAGNOSIS — I5030 Unspecified diastolic (congestive) heart failure: Secondary | ICD-10-CM | POA: Diagnosis not present

## 2018-02-05 DIAGNOSIS — Z6835 Body mass index (BMI) 35.0-35.9, adult: Secondary | ICD-10-CM | POA: Diagnosis not present

## 2018-02-05 DIAGNOSIS — A4902 Methicillin resistant Staphylococcus aureus infection, unspecified site: Secondary | ICD-10-CM | POA: Diagnosis not present

## 2018-02-05 DIAGNOSIS — E876 Hypokalemia: Secondary | ICD-10-CM | POA: Diagnosis not present

## 2018-02-05 DIAGNOSIS — Z79899 Other long term (current) drug therapy: Secondary | ICD-10-CM | POA: Diagnosis not present

## 2018-02-05 LAB — TYPE AND SCREEN
ABO/RH(D): O POS
Antibody Screen: NEGATIVE

## 2018-02-05 LAB — MAGNESIUM: Magnesium: 2.4 mg/dL (ref 1.7–2.4)

## 2018-02-05 LAB — HEPATIC FUNCTION PANEL
ALK PHOS: 207 U/L — AB (ref 38–126)
ALT: 41 U/L (ref 0–44)
AST: 42 U/L — ABNORMAL HIGH (ref 15–41)
Albumin: 2.4 g/dL — ABNORMAL LOW (ref 3.5–5.0)
BILIRUBIN DIRECT: 0.3 mg/dL — AB (ref 0.0–0.2)
Indirect Bilirubin: 0.6 mg/dL (ref 0.3–0.9)
Total Bilirubin: 0.9 mg/dL (ref 0.3–1.2)
Total Protein: 6.1 g/dL — ABNORMAL LOW (ref 6.5–8.1)

## 2018-02-05 LAB — CBC WITH DIFFERENTIAL/PLATELET
ABS IMMATURE GRANULOCYTES: 3.69 10*3/uL — AB (ref 0.00–0.07)
Basophils Absolute: 0 10*3/uL (ref 0.0–0.1)
Basophils Relative: 0 %
Eosinophils Absolute: 0 10*3/uL (ref 0.0–0.5)
Eosinophils Relative: 0 %
HCT: 31.5 % — ABNORMAL LOW (ref 36.0–46.0)
Hemoglobin: 10 g/dL — ABNORMAL LOW (ref 12.0–15.0)
Immature Granulocytes: 7 %
Lymphocytes Relative: 2 %
Lymphs Abs: 1.1 10*3/uL (ref 0.7–4.0)
MCH: 31.1 pg (ref 26.0–34.0)
MCHC: 31.7 g/dL (ref 30.0–36.0)
MCV: 97.8 fL (ref 80.0–100.0)
Monocytes Absolute: 1.3 10*3/uL — ABNORMAL HIGH (ref 0.1–1.0)
Monocytes Relative: 2 %
Neutro Abs: 50.2 10*3/uL — ABNORMAL HIGH (ref 1.7–7.7)
Neutrophils Relative %: 89 %
Platelets: 408 10*3/uL — ABNORMAL HIGH (ref 150–400)
RBC: 3.22 MIL/uL — ABNORMAL LOW (ref 3.87–5.11)
RDW: 14.6 % (ref 11.5–15.5)
WBC: 56.2 10*3/uL (ref 4.0–10.5)
nRBC: 0.1 % (ref 0.0–0.2)

## 2018-02-05 LAB — BASIC METABOLIC PANEL
ANION GAP: 10 (ref 5–15)
BUN: 32 mg/dL — ABNORMAL HIGH (ref 8–23)
CO2: 20 mmol/L — ABNORMAL LOW (ref 22–32)
Calcium: 8.2 mg/dL — ABNORMAL LOW (ref 8.9–10.3)
Chloride: 110 mmol/L (ref 98–111)
Creatinine, Ser: 1.15 mg/dL — ABNORMAL HIGH (ref 0.44–1.00)
GFR calc Af Amer: 55 mL/min — ABNORMAL LOW (ref >60)
GFR calc non Af Amer: 47 mL/min — ABNORMAL LOW (ref >60)
GLUCOSE: 129 mg/dL — AB (ref 70–99)
Potassium: 3.7 mmol/L (ref 3.5–5.1)
Sodium: 140 mmol/L (ref 135–145)

## 2018-02-05 LAB — LEGIONELLA PNEUMOPHILA SEROGP 1 UR AG: L. PNEUMOPHILA SEROGP 1 UR AG: NEGATIVE

## 2018-02-05 LAB — LACTATE DEHYDROGENASE: LDH: 375 U/L — ABNORMAL HIGH (ref 98–192)

## 2018-02-05 LAB — OCCULT BLOOD X 1 CARD TO LAB, STOOL: Fecal Occult Bld: NEGATIVE

## 2018-02-05 MED ORDER — OLANZAPINE 5 MG PO TABS: 10.0000 mg | ORAL_TABLET | Freq: Every day | ORAL | Status: DC

## 2018-02-05 MED ORDER — FUROSEMIDE 10 MG/ML IJ SOLN
40.0000 mg | Freq: Once | INTRAMUSCULAR | Status: AC
  Administered 2018-02-05: 40 mg via INTRAVENOUS
  Filled 2018-02-05: qty 4

## 2018-02-05 MED ORDER — GABAPENTIN 100 MG PO CAPS
100.0000 mg | ORAL_CAPSULE | Freq: Two times a day (BID) | ORAL | Status: DC
  Administered 2018-02-05: 100 mg via ORAL
  Filled 2018-02-05: qty 1

## 2018-02-05 MED ORDER — DILTIAZEM HCL 30 MG PO TABS: 30.0000 mg | ORAL_TABLET | Freq: Four times a day (QID) | ORAL | Status: DC

## 2018-02-05 MED ORDER — DILTIAZEM HCL 30 MG PO TABS
30.0000 mg | ORAL_TABLET | Freq: Four times a day (QID) | ORAL | Status: DC
  Administered 2018-02-05: 30 mg via ORAL
  Filled 2018-02-05: qty 1

## 2018-02-05 MED ORDER — LAMOTRIGINE 100 MG PO TABS
200.0000 mg | ORAL_TABLET | Freq: Two times a day (BID) | ORAL | Status: DC
  Administered 2018-02-05: 200 mg via ORAL
  Filled 2018-02-05: qty 2

## 2018-02-05 MED ORDER — DOXYCYCLINE HYCLATE 100 MG PO TABS: 100.0000 mg | ORAL_TABLET | Freq: Two times a day (BID) | ORAL | Status: DC

## 2018-02-05 MED ORDER — METHYLPREDNISOLONE SODIUM SUCC 125 MG IJ SOLR: 60.0000 mg | Freq: Four times a day (QID) | INTRAMUSCULAR | 0 refills | Status: DC

## 2018-02-05 MED ORDER — SODIUM CHLORIDE 0.9 % IV SOLN: 1.0000 g | INTRAVENOUS | Status: DC

## 2018-02-05 NOTE — Progress Notes (Signed)
Patient to be transferred to [facility] Room 339. Patient and her family are made aware. Report called and given to Everette Rank, Therapist, sports. All questions were answered and no further questions at this time. Pt in stable condition and in no acute distress at this time. Pt will be transported via [facility] transport.

## 2018-02-05 NOTE — Discharge Summary (Addendum)
Physician Discharge Summary  Heidi Fuentes PZW:[id number] DOB: 03-20-1945 DOA: 02/03/2018  PCP: Orpah Melter, MD  Admit date: 02/03/2018 Discharge date: 02/05/2018  Admitted From: Home Disposition:  Transfer to [facility]   Discharge Condition: Stable CODE STATUS: FULL Diet recommendation: Heart Healthy   Brief/Interim Summary: 73 year old female with a history of chronic back pain, bipolar disorder, fibromyalgia, hypertension, hypothyroidism, esophageal dysmotility, Schatzki's ring presented with 1 week history of worsening cough and shortness of breath. The patient states that she has been using her nebulizers and inhalers without much help. She has had coughing with green sputum occasionally that is blood-tinged. She has had subjective fevers and chills. She denies any nausea, vomiting, diarrhea, abdominal pain, dysuria, hematuria. She denies any hematochezia, melena, hematuria. The patient is very tangential regarding her answers and thought process. She states her oral intake is been poor. Upon presentation, the patient was noted to have WBC 43.5 with bandemia. Chest x-ray showed nodular opacities in the left lower lobe and right upper lobe and increasing interstitial markings in the left lung. 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 There was also a right small effusion and prominent groundglass opacification left upper lobe. The patient was started on ceftriaxone and doxycycline.  The patient was started on bronchodilators as well as steroids.  Her respiratory status did not significantly improve in the past 24 hours, but remained stable on 3 L nasal cannula.  On 02/05/2018, the patient's WBC count increased to 56.2 with a left shift.    The patient has remained afebrile hemodynamically stable on 3 L with saturation of 95-96%.  Although possibly from steroids, there was concern for an acute hematologic process.  The patient's  family requested transfer to tertiary care center. The leukemia service and hematology/oncology was contacted at [facility].  They agreed to accept the patient in transfer.  The case was discussed with the patient and son who agreed with transfer.  Discharge Diagnoses:  Acute respiratory failure with hypoxia -Secondary to pneumonia and asthma exacerbation -Presently stable on 3 L -Wean oxygen as tolerated for saturation greater than 92% -Pulmonary hygiene  Lobar pneumonia -Continue ceftriaxone and doxycycline -Check procalcitonin--10.16 -Urine Legionella antigen--pending -Urine Streptococcus pneumoniae antigen--negative  Lung masses -02/04/2018 CT of the chest--multiple bilateral large pleural-based masses up to 7 cm with associated prominent groundglass opacification left upper lobe -Pulmonary consult appreciated -Request IR biopsy once the patient's respiratory status is improving -Case was discussed with pulmonary as well as IR--they felt the patient needed to be treated with full course of antibiotics and optimize her respiratory status prior to biopsy to decrease the risk of false negative biopsy result -Planning for outpatient PET scan when the patient is stable for discharge  Acute asthma exacerbation -Continue Pulmicort -Continue Solu-Medrol -Continue Xopenex and Atrovent  Leukocytosis -WBC up to 56.2 (5 metamyelocytes, occasional bands, occasional myelos) -Certainly, the patient to steroids may have partly contributed to elevation secondary to demargination -Case discussed with leukemia service at [facility] as well as with heme/onc at [facility] -It was felt this is more likely leukemoid type reaction, not acute leukemia -However, patient was accepted in transfer when a bed is available  AKI -Likely due to infectious process and volume depletion -Baseline creatinine 0.7-2.9 -Continue IV fluids--> improved -Serum creatinine peaked at 2.02 -Holding  furosemide initially  Fluid overload -due to fluid resuscitation -In a.m. 02/05/2018, the patient developed some worsening shortness of breath without respiratory distress -  I personally reviewed repeat chest x-ray--bilateral pleural effusions, increased interstitial markings -Lasix 40 mg IV x1 given -Saline lock IV fluids  Sinus tachycardia/SVT -Telemetry noted that the patient has had intermittent SVT with activity up to 130s -Start diltiazem 30 mg every 6 -Personally reviewed EKG--sinus tachycardia, no concerning ST-T wave changes  Bipolar disorder -Restart fluoxetine, Lamictal, Zyprexa -Restart other psychotropic medications once medications have been reconciled  Chronic pain syndrome -Continue home dose of Percocet  Hypothyroidism -Continue Synthroid  Essential hypertension -Holding losartan in the setting of AKI -Hydralazine as needed SBP greater than 180 -Restart diltiazem  Hyperlipidemia -Continue pravastatin  Macrocytic anemia -Check anemia panel--iron saturation 3%, ferritin 701, folic acid 8.6, X79 390 -FOBT--negative -Hemoglobin has dropped 4 g in the last 3 months--hemoglobin 14.4 on 10/29/2017 -Restart ferrous sulfate      Discharge Instructions   Allergies as of 02/05/2018      Reactions   Abilify [aripiprazole]    Geodon [ziprasidone Hcl]    Quetiapine Other (See Comments)   Trazodone And Nefazodone    Sulfa Antibiotics Rash      Medication List    STOP taking these medications   furosemide 40 MG tablet Commonly known as:  LASIX   losartan 50 MG tablet Commonly known as:  COZAAR   VITAMIN D (ERGOCALCIFEROL) PO     TAKE these medications   acetaminophen 500 MG tablet Commonly known as:  TYLENOL Take 500 mg by mouth every 6 (six) hours as needed. For pain   Biotin 10000 MCG Tabs Take 1 capsule by mouth daily.   carbamazepine 200 MG tablet Commonly known as:  TEGRETOL Take 100 mg by mouth 3 (three) times daily.     cefTRIAXone 1 g in sodium chloride 0.9 % 100 mL Inject 1 g into the vein daily.   citalopram 10 MG tablet Commonly known as:  CELEXA Take 10 mg by mouth daily.   diltiazem 30 MG tablet Commonly known as:  CARDIZEM Take 1 tablet (30 mg total) by mouth every 6 (six) hours. What changed:  when to take this   doxycycline 100 MG tablet Commonly known as:  VIBRA-TABS Take 1 tablet (100 mg total) by mouth every 12 (twelve) hours.   ferrous sulfate 325 (65 FE) MG tablet Take 325 mg by mouth daily as needed.   FLUoxetine 40 MG capsule Commonly known as:  PROZAC Take 1 capsule by mouth daily.   fluticasone 50 MCG/ACT nasal spray Commonly known as:  FLONASE Place 2 sprays into both nostrils daily.   fluticasone-salmeterol 230-21 MCG/ACT inhaler Commonly known as:  ADVAIR HFA Inhale 2 puffs into the lungs 2 (two) times daily.   gabapentin 100 MG capsule Commonly known as:  NEURONTIN Take 1 capsule by mouth 2 (two) times daily.   ipratropium-albuterol 0.5-2.5 (3) MG/3ML Soln Commonly known as:  DUONEB Inhale 3 mLs into the lungs every 6 (six) hours as needed.   lamoTRIgine 100 MG tablet Commonly known as:  LAMICTAL Take 200 mg by mouth 2 (two) times daily.   latanoprost 0.005 % ophthalmic solution Commonly known as:  XALATAN Place 1 drop into both eyes at bedtime.   levothyroxine 50 MCG tablet Commonly known as:  SYNTHROID, LEVOTHROID Take 50 mcg by mouth daily.   meclizine 25 MG tablet Commonly known as:  ANTIVERT Take 25 mg by mouth 3 (three) times daily as needed for dizziness.   Melatonin 1 MG Tabs Take 3 mg by mouth at bedtime.   methylPREDNISolone sodium succinate 125  mg/2 mL injection Commonly known as:  SOLU-MEDROL Inject 0.96 mLs (60 mg total) into the vein every 6 (six) hours.   metoprolol succinate 50 MG 24 hr tablet Commonly known as:  TOPROL-XL Take 50 mg by mouth daily.   montelukast 10 MG tablet Commonly known as:  SINGULAIR Take 10 mg by mouth  daily.   OLANZapine 10 MG tablet Commonly known as:  ZYPREXA Take 1 tablet by mouth at bedtime.   oxyCODONE-acetaminophen 5-325 MG tablet Commonly known as:  PERCOCET/ROXICET Take 1-2 tablets by mouth every 8 (eight) hours as needed for severe pain.   pantoprazole 40 MG tablet Commonly known as:  PROTONIX TAKE 1 TABLET EVERY DAY   potassium chloride 10 MEQ CR capsule Commonly known as:  MICRO-K Take 1 capsule by mouth daily.   PRESERVISION/LUTEIN PO Take 1 tablet by mouth 2 (two) times daily.   tiZANidine 4 MG tablet Commonly known as:  ZANAFLEX Take 4 mg by mouth 2 (two) times daily as needed for muscle spasms.   triamcinolone cream 0.5 % Commonly known as:  KENALOG Apply 1 application topically 2 (two) times daily.   VENTOLIN HFA 108 (90 Base) MCG/ACT inhaler Generic drug:  albuterol Inhale 2 puffs into the lungs every 6 (six) hours as needed for wheezing or shortness of breath. For shortness of breath       Allergies  Allergen Reactions  . Abilify [Aripiprazole]   . Geodon [Ziprasidone Hcl]   . Quetiapine Other (See Comments)  . Trazodone And Nefazodone   . Sulfa Antibiotics Rash    Consultations:  -Pulmonary  -Interventional radiology  -Hematology oncology   Procedures/Studies: Dg Chest 2 View  Result Date: 02/03/2018 CLINICAL DATA:  Worsening cough and chest pain over the past week. EXAM: CHEST - 2 VIEW COMPARISON:  Chest x-ray dated October 26, 2017. FINDINGS: The heart size and mediastinal contours are within normal limits. Increased interstitial markings throughout the left lung and at the right lung base. New small right pleural effusion. Patchy opacity in the left lower lobe. Additional small nodular opacities in the left lower and right upper lobes. No pneumothorax. No acute osseous abnormality. IMPRESSION: 1. Left lower lobe pneumonia. Additional small nodular opacities in the left lower and right upper lobes may reflect additional sites of  atypical infection or inflammation. Consider chest CT for further evaluation or followup PA and lateral chest X-ray in 3-4 weeks following trial of antibiotic therapy to ensure resolution. 2. Small right pleural effusion. Electronically Signed   By: Titus Dubin M.D.   On: 02/03/2018 19:28   Ct Head Wo Contrast  Result Date: 01/07/2018 CLINICAL DATA:  73 year old female status post fall this morning getting out of bed. Struck left eye on nightstand. Bruising and painful extraocular movements. Dizziness, nausea, headache. EXAM: CT HEAD WITHOUT CONTRAST TECHNIQUE: Contiguous axial images were obtained from the base of the skull through the vertex without intravenous contrast. COMPARISON:  Head CT without contrast 10/26/2017. Orbit CT today reported separately. FINDINGS: Brain: Cerebral volume is stable and normal for age. Stable gray-white matter differentiation, mild for age white matter hypodensity appears stable. No midline shift, ventriculomegaly, mass effect, evidence of mass lesion, intracranial hemorrhage or evidence of cortically based acute infarction. Vascular: Calcified atherosclerosis at the skull base. No suspicious intracranial vascular hyperdensity. Skull: No calvarium fracture. Sinuses/Orbits: Abnormal left maxillary sinus, see orbit series today. Other paranasal sinuses and mastoids are stable and well pneumatized. Other: No scalp hematoma identified. Orbit findings today reported separately. IMPRESSION: 1.  See Orbit CT findings today reported separately. 2. Stable and normal for age noncontrast CT appearance of the brain. Electronically Signed   By: Genevie Ann M.D.   On: 01/07/2018 11:53   Ct Chest Wo Contrast  Result Date: 02/04/2018 CLINICAL DATA:  Acute onset of cough and congestion. EXAM: CT CHEST WITHOUT CONTRAST TECHNIQUE: Multidetector CT imaging of the chest was performed following the standard protocol without IV contrast. COMPARISON:  Chest radiograph performed earlier today at 6:37  p.m., and CT of the thoracic spine performed 09/06/2015 FINDINGS: Cardiovascular: The heart is normal in size. Scattered calcification is noted along the thoracic aorta and proximal great vessels. Mediastinum/Nodes: A 1.2 cm subcarinal node is noted. A 1.2 cm aortopulmonary window node is also seen. No pericardial effusion is identified. The visualized portions of the thyroid gland are unremarkable. No axillary lymphadenopathy is seen. Severe tracheomalacia is noted. Lungs/Pleura: Multiple large masses are noted throughout both lungs, several of which are pleural based. These measure up to 7 cm in size. There is associated prominent ground-glass opacification, particularly at the left upper lobe, raising suspicion for underlying diffuse lb over hemorrhage. Findings are concerning for malignancy, possibly metastatic disease from an unknown primary. Alternately, this could reflect a severe atypical infectious process. A small right pleural effusion is noted. No pneumothorax is seen. Upper Abdomen: The visualized portions of the liver and spleen are unremarkable. Postoperative change is noted at the gastroesophageal junction. The visualized portions of the pancreas and adrenal glands are within normal limits. Musculoskeletal: No acute osseous abnormalities are identified. The visualized musculature is unremarkable in appearance. IMPRESSION: 1. Multiple large masses throughout both lungs, several of which are pleural based, measuring up to 7 cm in size. Associated prominent ground-glass opacification, particularly at the left upper lobe, raising suspicion for underlying diffuse alveolar hemorrhage. Findings concerning for malignancy, possibly metastatic disease from an unknown primary. Alternatively, this could reflect a severe atypical infectious process. Tissue diagnosis or cytology from the patient's right-sided pleural effusion would be helpful, as deemed clinically appropriate. 2. Small right pleural effusion  noted. 3. Severe tracheomalacia noted. 4. Mildly enlarged mediastinal nodes, measuring up to 1.2 cm in short axis. This could reflect metastatic disease or sequelae of infection. Electronically Signed   By: Garald Balding M.D.   On: 02/04/2018 00:04   Ct Orbits Wo Contrast  Result Date: 01/07/2018 CLINICAL DATA:  73 year old female status post fall out of bed, struck left eye on nightstand. Painful orbit range of motion. EXAM: CT ORBITS WITHOUT CONTRAST TECHNIQUE: Multidetector CT images were obtained using the standard protocol without intravenous contrast. COMPARISON:  Head CT today reported separately. Head and cervical spine CT 09/06/2015. FINDINGS: Orbits: Wide fracture of the left orbital floor (about 10 millimeters across) with prominent 15 millimeter focus of herniated intraorbital fat and a portion of the inferior rectus muscle is also mildly herniated (series 12, image 23). The left lamina papyracea, superior and lateral orbital walls remain intact. The left globe is intact. There is a mild degree of left intraorbital fat stranding, perhaps soft tissue edema. No discrete hematoma within the orbit. The contralateral right orbit appears intact and negative. Visualized sinuses: Moderate hemorrhage level within the left maxillary sinus. The visible left maxilla appears intact aside from the orbital floor fracture. Likewise, intact visible zygoma and central skull base. There is mild fluid or mucosal thickening in a posterior left ethmoid air cell but otherwise the paranasal sinuses are clear. Soft tissues: Mild superficial left periorbital soft tissue contusion  or hematoma. No subcutaneous gas. Limited intracranial: Reported separately today. IMPRESSION: 1. Wide fracture of the left orbital floor with herniated 15 mm section of intraorbital fat and a portion of the inferior rectus muscle. Additional left intraorbital soft tissue edema suspected. No discrete intraorbital hematoma. Hemorrhage layering in the  left maxillary sinus. Mild left periorbital soft tissue contusion or hematoma. 2. No other acute fracture identified. 3. Head CT reported separately. Electronically Signed   By: Genevie Ann M.D.   On: 01/07/2018 12:02        Discharge Exam: Vitals:   02/05/18 0604 02/05/18 0931  BP: (!) 174/80 (!) 165/78  Pulse: (!) 114   Resp: 18   Temp:    SpO2: 97%    Vitals:   02/05/18 0204 02/05/18 0439 02/05/18 0604 02/05/18 0931  BP:  (!) 157/83 (!) 174/80 (!) 165/78  Pulse:  (!) 117 (!) 114   Resp:  18 18   Temp:  97.7 F (36.5 C)    TempSrc:  Oral    SpO2: 94% 94% 97%   Weight:      Height:        General: Pt is alert, awake, not in acute distress Cardiovascular: RRR, S1/S2 +, no rubs, no gallops Respiratory: Bilateral scattered rales.  Bibasal expiratory wheeze. Abdominal: Soft, NT, ND, bowel sounds + Extremities: Trace lower extremity edema, no cyanosis   The results of significant diagnostics from this hospitalization (including imaging, microbiology, ancillary and laboratory) are listed below for reference.    Significant Diagnostic Studies: Dg Chest 2 View  Result Date: 02/03/2018 CLINICAL DATA:  Worsening cough and chest pain over the past week. EXAM: CHEST - 2 VIEW COMPARISON:  Chest x-ray dated October 26, 2017. FINDINGS: The heart size and mediastinal contours are within normal limits. Increased interstitial markings throughout the left lung and at the right lung base. New small right pleural effusion. Patchy opacity in the left lower lobe. Additional small nodular opacities in the left lower and right upper lobes. No pneumothorax. No acute osseous abnormality. IMPRESSION: 1. Left lower lobe pneumonia. Additional small nodular opacities in the left lower and right upper lobes may reflect additional sites of atypical infection or inflammation. Consider chest CT for further evaluation or followup PA and lateral chest X-ray in 3-4 weeks following trial of antibiotic therapy to  ensure resolution. 2. Small right pleural effusion. Electronically Signed   By: Titus Dubin M.D.   On: 02/03/2018 19:28   Ct Head Wo Contrast  Result Date: 01/07/2018 CLINICAL DATA:  73 year old female status post fall this morning getting out of bed. Struck left eye on nightstand. Bruising and painful extraocular movements. Dizziness, nausea, headache. EXAM: CT HEAD WITHOUT CONTRAST TECHNIQUE: Contiguous axial images were obtained from the base of the skull through the vertex without intravenous contrast. COMPARISON:  Head CT without contrast 10/26/2017. Orbit CT today reported separately. FINDINGS: Brain: Cerebral volume is stable and normal for age. Stable gray-white matter differentiation, mild for age white matter hypodensity appears stable. No midline shift, ventriculomegaly, mass effect, evidence of mass lesion, intracranial hemorrhage or evidence of cortically based acute infarction. Vascular: Calcified atherosclerosis at the skull base. No suspicious intracranial vascular hyperdensity. Skull: No calvarium fracture. Sinuses/Orbits: Abnormal left maxillary sinus, see orbit series today. Other paranasal sinuses and mastoids are stable and well pneumatized. Other: No scalp hematoma identified. Orbit findings today reported separately. IMPRESSION: 1. See Orbit CT findings today reported separately. 2. Stable and normal for age noncontrast CT appearance of the  brain. Electronically Signed   By: Genevie Ann M.D.   On: 01/07/2018 11:53   Ct Chest Wo Contrast  Result Date: 02/04/2018 CLINICAL DATA:  Acute onset of cough and congestion. EXAM: CT CHEST WITHOUT CONTRAST TECHNIQUE: Multidetector CT imaging of the chest was performed following the standard protocol without IV contrast. COMPARISON:  Chest radiograph performed earlier today at 6:37 p.m., and CT of the thoracic spine performed 09/06/2015 FINDINGS: Cardiovascular: The heart is normal in size. Scattered calcification is noted along the thoracic aorta  and proximal great vessels. Mediastinum/Nodes: A 1.2 cm subcarinal node is noted. A 1.2 cm aortopulmonary window node is also seen. No pericardial effusion is identified. The visualized portions of the thyroid gland are unremarkable. No axillary lymphadenopathy is seen. Severe tracheomalacia is noted. Lungs/Pleura: Multiple large masses are noted throughout both lungs, several of which are pleural based. These measure up to 7 cm in size. There is associated prominent ground-glass opacification, particularly at the left upper lobe, raising suspicion for underlying diffuse lb over hemorrhage. Findings are concerning for malignancy, possibly metastatic disease from an unknown primary. Alternately, this could reflect a severe atypical infectious process. A small right pleural effusion is noted. No pneumothorax is seen. Upper Abdomen: The visualized portions of the liver and spleen are unremarkable. Postoperative change is noted at the gastroesophageal junction. The visualized portions of the pancreas and adrenal glands are within normal limits. Musculoskeletal: No acute osseous abnormalities are identified. The visualized musculature is unremarkable in appearance. IMPRESSION: 1. Multiple large masses throughout both lungs, several of which are pleural based, measuring up to 7 cm in size. Associated prominent ground-glass opacification, particularly at the left upper lobe, raising suspicion for underlying diffuse alveolar hemorrhage. Findings concerning for malignancy, possibly metastatic disease from an unknown primary. Alternatively, this could reflect a severe atypical infectious process. Tissue diagnosis or cytology from the patient's right-sided pleural effusion would be helpful, as deemed clinically appropriate. 2. Small right pleural effusion noted. 3. Severe tracheomalacia noted. 4. Mildly enlarged mediastinal nodes, measuring up to 1.2 cm in short axis. This could reflect metastatic disease or sequelae of  infection. Electronically Signed   By: Garald Balding M.D.   On: 02/04/2018 00:04   Ct Orbits Wo Contrast  Result Date: 01/07/2018 CLINICAL DATA:  73 year old female status post fall out of bed, struck left eye on nightstand. Painful orbit range of motion. EXAM: CT ORBITS WITHOUT CONTRAST TECHNIQUE: Multidetector CT images were obtained using the standard protocol without intravenous contrast. COMPARISON:  Head CT today reported separately. Head and cervical spine CT 09/06/2015. FINDINGS: Orbits: Wide fracture of the left orbital floor (about 10 millimeters across) with prominent 15 millimeter focus of herniated intraorbital fat and a portion of the inferior rectus muscle is also mildly herniated (series 12, image 23). The left lamina papyracea, superior and lateral orbital walls remain intact. The left globe is intact. There is a mild degree of left intraorbital fat stranding, perhaps soft tissue edema. No discrete hematoma within the orbit. The contralateral right orbit appears intact and negative. Visualized sinuses: Moderate hemorrhage level within the left maxillary sinus. The visible left maxilla appears intact aside from the orbital floor fracture. Likewise, intact visible zygoma and central skull base. There is mild fluid or mucosal thickening in a posterior left ethmoid air cell but otherwise the paranasal sinuses are clear. Soft tissues: Mild superficial left periorbital soft tissue contusion or hematoma. No subcutaneous gas. Limited intracranial: Reported separately today. IMPRESSION: 1. Wide fracture of the left orbital  floor with herniated 15 mm section of intraorbital fat and a portion of the inferior rectus muscle. Additional left intraorbital soft tissue edema suspected. No discrete intraorbital hematoma. Hemorrhage layering in the left maxillary sinus. Mild left periorbital soft tissue contusion or hematoma. 2. No other acute fracture identified. 3. Head CT reported separately. Electronically  Signed   By: Genevie Ann M.D.   On: 01/07/2018 12:02     Microbiology: Recent Results (from the past 240 hour(s))  Expectorated sputum assessment w rflx to resp cult     Status: None   Collection Time: 02/04/18 11:04 AM  Result Value Ref Range Status   Specimen Description EXPECTORATED SPUTUM  Final   Special Requests NONE  Final   Sputum evaluation   Final    Sputum specimen not acceptable for testing.  Please recollect.   Gram Stain Report Called to,Read Back By and Verified With: BULLINS L. AT 1211PM ON [id number] BY THOMPSON S. Performed at [facility], [address] [place],  [address]    Report Status 02/04/2018 FINAL  Final     Labs: Basic Metabolic Panel: Recent Labs  Lab 02/03/18 1924 02/04/18 0257 02/05/18 0656  NA 134* 136 140  K 4.7 4.0 3.7  CL 100 107 110  CO2 21* 20* 20*  GLUCOSE 91 83 129*  BUN 36* 32* 32*  CREATININE 2.02* 1.68* 1.15*  CALCIUM 8.7* 7.9* 8.2*  MG  --   --  2.4   Liver Function Tests: Recent Labs  Lab 02/04/18 0257  AST 34  ALT 37  ALKPHOS 152*  BILITOT 1.1  PROT 5.3*  ALBUMIN 2.1*   No results for input(s): LIPASE, AMYLASE in the last 168 hours. No results for input(s): AMMONIA in the last 168 hours. CBC: Recent Labs  Lab 02/03/18 1924 02/04/18 0257 02/05/18 0656  WBC 43.5* 38.8* 56.2*  NEUTROABS 38.4* 34.8* 50.2*  HGB 9.9* 8.6* 10.0*  HCT 31.9* 27.0* 31.5*  MCV 100.3* 96.1 97.8  PLT PLATELET CLUMPS NOTED ON SMEAR, COUNT APPEARS ADEQUATE 356 408*   Cardiac Enzymes: No results for input(s): CKTOTAL, CKMB, CKMBINDEX, TROPONINI in the last 168 hours. BNP: Invalid input(s): POCBNP CBG: No results for input(s): GLUCAP in the last 168 hours.  Time coordinating discharge:  46 minutes  Signed:  Orson Eva, DO [facility] Pager: [phone] 02/05/2018, 9:43 AM

## 2018-02-13 DIAGNOSIS — J15212 Pneumonia due to Methicillin resistant Staphylococcus aureus: Secondary | ICD-10-CM | POA: Insufficient documentation

## 2018-02-16 DIAGNOSIS — J9600 Acute respiratory failure, unspecified whether with hypoxia or hypercapnia: Secondary | ICD-10-CM | POA: Diagnosis not present

## 2018-02-16 DIAGNOSIS — R05 Cough: Secondary | ICD-10-CM | POA: Diagnosis not present

## 2018-02-16 DIAGNOSIS — I110 Hypertensive heart disease with heart failure: Secondary | ICD-10-CM | POA: Diagnosis not present

## 2018-02-16 DIAGNOSIS — E876 Hypokalemia: Secondary | ICD-10-CM | POA: Diagnosis not present

## 2018-02-16 DIAGNOSIS — J151 Pneumonia due to Pseudomonas: Secondary | ICD-10-CM | POA: Diagnosis not present

## 2018-02-16 DIAGNOSIS — J9601 Acute respiratory failure with hypoxia: Secondary | ICD-10-CM | POA: Diagnosis not present

## 2018-02-16 DIAGNOSIS — A4902 Methicillin resistant Staphylococcus aureus infection, unspecified site: Secondary | ICD-10-CM | POA: Diagnosis not present

## 2018-02-16 DIAGNOSIS — F319 Bipolar disorder, unspecified: Secondary | ICD-10-CM | POA: Diagnosis not present

## 2018-02-16 DIAGNOSIS — A689 Relapsing fever, unspecified: Secondary | ICD-10-CM | POA: Diagnosis not present

## 2018-02-16 DIAGNOSIS — D72829 Elevated white blood cell count, unspecified: Secondary | ICD-10-CM | POA: Diagnosis not present

## 2018-02-16 DIAGNOSIS — R5381 Other malaise: Secondary | ICD-10-CM | POA: Diagnosis not present

## 2018-02-16 DIAGNOSIS — R2681 Unsteadiness on feet: Secondary | ICD-10-CM | POA: Diagnosis not present

## 2018-02-16 DIAGNOSIS — H409 Unspecified glaucoma: Secondary | ICD-10-CM | POA: Diagnosis not present

## 2018-02-16 DIAGNOSIS — J3089 Other allergic rhinitis: Secondary | ICD-10-CM | POA: Diagnosis not present

## 2018-02-16 DIAGNOSIS — I10 Essential (primary) hypertension: Secondary | ICD-10-CM | POA: Diagnosis not present

## 2018-02-16 DIAGNOSIS — E039 Hypothyroidism, unspecified: Secondary | ICD-10-CM | POA: Diagnosis not present

## 2018-02-16 DIAGNOSIS — R1312 Dysphagia, oropharyngeal phase: Secondary | ICD-10-CM | POA: Diagnosis not present

## 2018-02-16 DIAGNOSIS — N390 Urinary tract infection, site not specified: Secondary | ICD-10-CM | POA: Diagnosis not present

## 2018-02-16 DIAGNOSIS — D649 Anemia, unspecified: Secondary | ICD-10-CM | POA: Diagnosis not present

## 2018-02-16 DIAGNOSIS — J90 Pleural effusion, not elsewhere classified: Secondary | ICD-10-CM | POA: Diagnosis not present

## 2018-02-16 DIAGNOSIS — J948 Other specified pleural conditions: Secondary | ICD-10-CM | POA: Diagnosis not present

## 2018-02-16 DIAGNOSIS — Z9181 History of falling: Secondary | ICD-10-CM | POA: Diagnosis not present

## 2018-02-16 DIAGNOSIS — R4182 Altered mental status, unspecified: Secondary | ICD-10-CM | POA: Diagnosis not present

## 2018-02-16 DIAGNOSIS — Z743 Need for continuous supervision: Secondary | ICD-10-CM | POA: Diagnosis not present

## 2018-02-16 DIAGNOSIS — R748 Abnormal levels of other serum enzymes: Secondary | ICD-10-CM | POA: Diagnosis not present

## 2018-02-16 DIAGNOSIS — E785 Hyperlipidemia, unspecified: Secondary | ICD-10-CM | POA: Diagnosis not present

## 2018-02-16 DIAGNOSIS — I509 Heart failure, unspecified: Secondary | ICD-10-CM | POA: Diagnosis not present

## 2018-02-16 DIAGNOSIS — R279 Unspecified lack of coordination: Secondary | ICD-10-CM | POA: Diagnosis not present

## 2018-02-16 DIAGNOSIS — I5030 Unspecified diastolic (congestive) heart failure: Secondary | ICD-10-CM | POA: Diagnosis not present

## 2018-02-16 DIAGNOSIS — R0602 Shortness of breath: Secondary | ICD-10-CM | POA: Diagnosis not present

## 2018-02-16 DIAGNOSIS — G8929 Other chronic pain: Secondary | ICD-10-CM | POA: Diagnosis not present

## 2018-02-16 DIAGNOSIS — K210 Gastro-esophageal reflux disease with esophagitis: Secondary | ICD-10-CM | POA: Diagnosis not present

## 2018-02-16 DIAGNOSIS — R509 Fever, unspecified: Secondary | ICD-10-CM | POA: Diagnosis not present

## 2018-02-16 DIAGNOSIS — R918 Other nonspecific abnormal finding of lung field: Secondary | ICD-10-CM | POA: Diagnosis not present

## 2018-02-16 DIAGNOSIS — E8770 Fluid overload, unspecified: Secondary | ICD-10-CM | POA: Diagnosis not present

## 2018-02-16 DIAGNOSIS — J189 Pneumonia, unspecified organism: Secondary | ICD-10-CM | POA: Diagnosis not present

## 2018-02-16 DIAGNOSIS — K5900 Constipation, unspecified: Secondary | ICD-10-CM | POA: Diagnosis not present

## 2018-02-16 DIAGNOSIS — M549 Dorsalgia, unspecified: Secondary | ICD-10-CM | POA: Diagnosis not present

## 2018-02-24 DIAGNOSIS — R918 Other nonspecific abnormal finding of lung field: Secondary | ICD-10-CM | POA: Diagnosis not present

## 2018-02-24 DIAGNOSIS — D72829 Elevated white blood cell count, unspecified: Secondary | ICD-10-CM | POA: Diagnosis not present

## 2018-02-24 DIAGNOSIS — J151 Pneumonia due to Pseudomonas: Secondary | ICD-10-CM | POA: Diagnosis not present

## 2018-02-24 DIAGNOSIS — R509 Fever, unspecified: Secondary | ICD-10-CM | POA: Diagnosis not present

## 2018-02-24 DIAGNOSIS — J90 Pleural effusion, not elsewhere classified: Secondary | ICD-10-CM | POA: Diagnosis not present

## 2018-02-24 DIAGNOSIS — A4902 Methicillin resistant Staphylococcus aureus infection, unspecified site: Secondary | ICD-10-CM | POA: Diagnosis not present

## 2018-02-28 DIAGNOSIS — J151 Pneumonia due to Pseudomonas: Secondary | ICD-10-CM | POA: Diagnosis not present

## 2018-02-28 DIAGNOSIS — A4902 Methicillin resistant Staphylococcus aureus infection, unspecified site: Secondary | ICD-10-CM | POA: Diagnosis not present

## 2018-02-28 DIAGNOSIS — J90 Pleural effusion, not elsewhere classified: Secondary | ICD-10-CM | POA: Diagnosis not present

## 2018-02-28 DIAGNOSIS — G8929 Other chronic pain: Secondary | ICD-10-CM | POA: Diagnosis not present

## 2018-02-28 DIAGNOSIS — M549 Dorsalgia, unspecified: Secondary | ICD-10-CM | POA: Diagnosis not present

## 2018-03-14 DIAGNOSIS — R918 Other nonspecific abnormal finding of lung field: Secondary | ICD-10-CM | POA: Diagnosis not present

## 2018-03-14 DIAGNOSIS — A4902 Methicillin resistant Staphylococcus aureus infection, unspecified site: Secondary | ICD-10-CM | POA: Diagnosis not present

## 2018-03-14 DIAGNOSIS — J151 Pneumonia due to Pseudomonas: Secondary | ICD-10-CM | POA: Diagnosis not present

## 2018-03-14 DIAGNOSIS — R509 Fever, unspecified: Secondary | ICD-10-CM | POA: Diagnosis not present

## 2018-03-14 DIAGNOSIS — J90 Pleural effusion, not elsewhere classified: Secondary | ICD-10-CM | POA: Diagnosis not present

## 2018-03-15 DIAGNOSIS — F319 Bipolar disorder, unspecified: Secondary | ICD-10-CM | POA: Diagnosis not present

## 2018-03-15 DIAGNOSIS — H409 Unspecified glaucoma: Secondary | ICD-10-CM | POA: Diagnosis not present

## 2018-03-15 DIAGNOSIS — K210 Gastro-esophageal reflux disease with esophagitis: Secondary | ICD-10-CM | POA: Diagnosis not present

## 2018-03-15 DIAGNOSIS — J3089 Other allergic rhinitis: Secondary | ICD-10-CM | POA: Diagnosis not present

## 2018-03-15 DIAGNOSIS — J189 Pneumonia, unspecified organism: Secondary | ICD-10-CM | POA: Diagnosis not present

## 2018-03-15 DIAGNOSIS — G8929 Other chronic pain: Secondary | ICD-10-CM | POA: Diagnosis not present

## 2018-03-15 DIAGNOSIS — I110 Hypertensive heart disease with heart failure: Secondary | ICD-10-CM | POA: Diagnosis not present

## 2018-03-15 DIAGNOSIS — J9600 Acute respiratory failure, unspecified whether with hypoxia or hypercapnia: Secondary | ICD-10-CM | POA: Diagnosis not present

## 2018-03-15 DIAGNOSIS — K5900 Constipation, unspecified: Secondary | ICD-10-CM | POA: Diagnosis not present

## 2018-03-15 DIAGNOSIS — Z9181 History of falling: Secondary | ICD-10-CM | POA: Diagnosis not present

## 2018-03-15 DIAGNOSIS — I509 Heart failure, unspecified: Secondary | ICD-10-CM | POA: Diagnosis not present

## 2018-03-15 DIAGNOSIS — E039 Hypothyroidism, unspecified: Secondary | ICD-10-CM | POA: Diagnosis not present

## 2018-03-17 DIAGNOSIS — J151 Pneumonia due to Pseudomonas: Secondary | ICD-10-CM | POA: Diagnosis not present

## 2018-03-17 DIAGNOSIS — R911 Solitary pulmonary nodule: Secondary | ICD-10-CM | POA: Diagnosis not present

## 2018-03-17 DIAGNOSIS — J45909 Unspecified asthma, uncomplicated: Secondary | ICD-10-CM | POA: Diagnosis not present

## 2018-03-17 DIAGNOSIS — J90 Pleural effusion, not elsewhere classified: Secondary | ICD-10-CM | POA: Diagnosis not present

## 2018-03-17 DIAGNOSIS — R918 Other nonspecific abnormal finding of lung field: Secondary | ICD-10-CM | POA: Diagnosis not present

## 2018-03-17 DIAGNOSIS — Z8701 Personal history of pneumonia (recurrent): Secondary | ICD-10-CM | POA: Diagnosis not present

## 2018-03-17 DIAGNOSIS — J449 Chronic obstructive pulmonary disease, unspecified: Secondary | ICD-10-CM | POA: Diagnosis not present

## 2018-03-18 DIAGNOSIS — J9600 Acute respiratory failure, unspecified whether with hypoxia or hypercapnia: Secondary | ICD-10-CM | POA: Diagnosis not present

## 2018-03-18 DIAGNOSIS — I110 Hypertensive heart disease with heart failure: Secondary | ICD-10-CM | POA: Diagnosis not present

## 2018-03-18 DIAGNOSIS — G8929 Other chronic pain: Secondary | ICD-10-CM | POA: Diagnosis not present

## 2018-03-18 DIAGNOSIS — H409 Unspecified glaucoma: Secondary | ICD-10-CM | POA: Diagnosis not present

## 2018-03-18 DIAGNOSIS — J189 Pneumonia, unspecified organism: Secondary | ICD-10-CM | POA: Diagnosis not present

## 2018-03-18 DIAGNOSIS — I509 Heart failure, unspecified: Secondary | ICD-10-CM | POA: Diagnosis not present

## 2018-03-21 DIAGNOSIS — I509 Heart failure, unspecified: Secondary | ICD-10-CM | POA: Diagnosis not present

## 2018-03-21 DIAGNOSIS — I110 Hypertensive heart disease with heart failure: Secondary | ICD-10-CM | POA: Diagnosis not present

## 2018-03-21 DIAGNOSIS — J189 Pneumonia, unspecified organism: Secondary | ICD-10-CM | POA: Diagnosis not present

## 2018-03-21 DIAGNOSIS — G8929 Other chronic pain: Secondary | ICD-10-CM | POA: Diagnosis not present

## 2018-03-21 DIAGNOSIS — J9600 Acute respiratory failure, unspecified whether with hypoxia or hypercapnia: Secondary | ICD-10-CM | POA: Diagnosis not present

## 2018-03-21 DIAGNOSIS — H409 Unspecified glaucoma: Secondary | ICD-10-CM | POA: Diagnosis not present

## 2018-03-22 DIAGNOSIS — R918 Other nonspecific abnormal finding of lung field: Secondary | ICD-10-CM | POA: Diagnosis not present

## 2018-03-22 DIAGNOSIS — J151 Pneumonia due to Pseudomonas: Secondary | ICD-10-CM | POA: Diagnosis not present

## 2018-03-23 DIAGNOSIS — J189 Pneumonia, unspecified organism: Secondary | ICD-10-CM | POA: Diagnosis not present

## 2018-03-23 DIAGNOSIS — I509 Heart failure, unspecified: Secondary | ICD-10-CM | POA: Diagnosis not present

## 2018-03-23 DIAGNOSIS — H409 Unspecified glaucoma: Secondary | ICD-10-CM | POA: Diagnosis not present

## 2018-03-23 DIAGNOSIS — J9600 Acute respiratory failure, unspecified whether with hypoxia or hypercapnia: Secondary | ICD-10-CM | POA: Diagnosis not present

## 2018-03-23 DIAGNOSIS — G8929 Other chronic pain: Secondary | ICD-10-CM | POA: Diagnosis not present

## 2018-03-23 DIAGNOSIS — I110 Hypertensive heart disease with heart failure: Secondary | ICD-10-CM | POA: Diagnosis not present

## 2018-03-24 DIAGNOSIS — I509 Heart failure, unspecified: Secondary | ICD-10-CM | POA: Diagnosis not present

## 2018-03-24 DIAGNOSIS — G8929 Other chronic pain: Secondary | ICD-10-CM | POA: Diagnosis not present

## 2018-03-24 DIAGNOSIS — H409 Unspecified glaucoma: Secondary | ICD-10-CM | POA: Diagnosis not present

## 2018-03-24 DIAGNOSIS — J189 Pneumonia, unspecified organism: Secondary | ICD-10-CM | POA: Diagnosis not present

## 2018-03-24 DIAGNOSIS — I110 Hypertensive heart disease with heart failure: Secondary | ICD-10-CM | POA: Diagnosis not present

## 2018-03-24 DIAGNOSIS — J9600 Acute respiratory failure, unspecified whether with hypoxia or hypercapnia: Secondary | ICD-10-CM | POA: Diagnosis not present

## 2018-03-25 DIAGNOSIS — H353222 Exudative age-related macular degeneration, left eye, with inactive choroidal neovascularization: Secondary | ICD-10-CM | POA: Diagnosis not present

## 2018-03-29 DIAGNOSIS — I509 Heart failure, unspecified: Secondary | ICD-10-CM | POA: Diagnosis not present

## 2018-03-29 DIAGNOSIS — G8929 Other chronic pain: Secondary | ICD-10-CM | POA: Diagnosis not present

## 2018-03-29 DIAGNOSIS — I110 Hypertensive heart disease with heart failure: Secondary | ICD-10-CM | POA: Diagnosis not present

## 2018-03-29 DIAGNOSIS — J9600 Acute respiratory failure, unspecified whether with hypoxia or hypercapnia: Secondary | ICD-10-CM | POA: Diagnosis not present

## 2018-03-29 DIAGNOSIS — H409 Unspecified glaucoma: Secondary | ICD-10-CM | POA: Diagnosis not present

## 2018-03-29 DIAGNOSIS — J189 Pneumonia, unspecified organism: Secondary | ICD-10-CM | POA: Diagnosis not present

## 2018-03-30 DIAGNOSIS — J189 Pneumonia, unspecified organism: Secondary | ICD-10-CM | POA: Diagnosis not present

## 2018-03-30 DIAGNOSIS — M549 Dorsalgia, unspecified: Secondary | ICD-10-CM | POA: Diagnosis not present

## 2018-03-31 DIAGNOSIS — J189 Pneumonia, unspecified organism: Secondary | ICD-10-CM | POA: Diagnosis not present

## 2018-03-31 DIAGNOSIS — I110 Hypertensive heart disease with heart failure: Secondary | ICD-10-CM | POA: Diagnosis not present

## 2018-03-31 DIAGNOSIS — J9600 Acute respiratory failure, unspecified whether with hypoxia or hypercapnia: Secondary | ICD-10-CM | POA: Diagnosis not present

## 2018-03-31 DIAGNOSIS — H409 Unspecified glaucoma: Secondary | ICD-10-CM | POA: Diagnosis not present

## 2018-03-31 DIAGNOSIS — I509 Heart failure, unspecified: Secondary | ICD-10-CM | POA: Diagnosis not present

## 2018-03-31 DIAGNOSIS — G8929 Other chronic pain: Secondary | ICD-10-CM | POA: Diagnosis not present

## 2018-04-01 DIAGNOSIS — I110 Hypertensive heart disease with heart failure: Secondary | ICD-10-CM | POA: Diagnosis not present

## 2018-04-01 DIAGNOSIS — J9600 Acute respiratory failure, unspecified whether with hypoxia or hypercapnia: Secondary | ICD-10-CM | POA: Diagnosis not present

## 2018-04-01 DIAGNOSIS — I509 Heart failure, unspecified: Secondary | ICD-10-CM | POA: Diagnosis not present

## 2018-04-01 DIAGNOSIS — H409 Unspecified glaucoma: Secondary | ICD-10-CM | POA: Diagnosis not present

## 2018-04-01 DIAGNOSIS — J189 Pneumonia, unspecified organism: Secondary | ICD-10-CM | POA: Diagnosis not present

## 2018-04-01 DIAGNOSIS — G8929 Other chronic pain: Secondary | ICD-10-CM | POA: Diagnosis not present

## 2018-04-04 DIAGNOSIS — H409 Unspecified glaucoma: Secondary | ICD-10-CM | POA: Diagnosis not present

## 2018-04-04 DIAGNOSIS — J189 Pneumonia, unspecified organism: Secondary | ICD-10-CM | POA: Diagnosis not present

## 2018-04-04 DIAGNOSIS — I110 Hypertensive heart disease with heart failure: Secondary | ICD-10-CM | POA: Diagnosis not present

## 2018-04-04 DIAGNOSIS — J9600 Acute respiratory failure, unspecified whether with hypoxia or hypercapnia: Secondary | ICD-10-CM | POA: Diagnosis not present

## 2018-04-04 DIAGNOSIS — I509 Heart failure, unspecified: Secondary | ICD-10-CM | POA: Diagnosis not present

## 2018-04-04 DIAGNOSIS — G8929 Other chronic pain: Secondary | ICD-10-CM | POA: Diagnosis not present

## 2018-04-05 DIAGNOSIS — I110 Hypertensive heart disease with heart failure: Secondary | ICD-10-CM | POA: Diagnosis not present

## 2018-04-05 DIAGNOSIS — J189 Pneumonia, unspecified organism: Secondary | ICD-10-CM | POA: Diagnosis not present

## 2018-04-05 DIAGNOSIS — I509 Heart failure, unspecified: Secondary | ICD-10-CM | POA: Diagnosis not present

## 2018-04-05 DIAGNOSIS — J9600 Acute respiratory failure, unspecified whether with hypoxia or hypercapnia: Secondary | ICD-10-CM | POA: Diagnosis not present

## 2018-04-05 DIAGNOSIS — G8929 Other chronic pain: Secondary | ICD-10-CM | POA: Diagnosis not present

## 2018-04-05 DIAGNOSIS — H409 Unspecified glaucoma: Secondary | ICD-10-CM | POA: Diagnosis not present

## 2018-04-06 DIAGNOSIS — J189 Pneumonia, unspecified organism: Secondary | ICD-10-CM | POA: Diagnosis not present

## 2018-04-06 DIAGNOSIS — I509 Heart failure, unspecified: Secondary | ICD-10-CM | POA: Diagnosis not present

## 2018-04-06 DIAGNOSIS — I110 Hypertensive heart disease with heart failure: Secondary | ICD-10-CM | POA: Diagnosis not present

## 2018-04-06 DIAGNOSIS — G8929 Other chronic pain: Secondary | ICD-10-CM | POA: Diagnosis not present

## 2018-04-06 DIAGNOSIS — H409 Unspecified glaucoma: Secondary | ICD-10-CM | POA: Diagnosis not present

## 2018-04-06 DIAGNOSIS — J9600 Acute respiratory failure, unspecified whether with hypoxia or hypercapnia: Secondary | ICD-10-CM | POA: Diagnosis not present

## 2018-04-07 DIAGNOSIS — H409 Unspecified glaucoma: Secondary | ICD-10-CM | POA: Diagnosis not present

## 2018-04-07 DIAGNOSIS — G8929 Other chronic pain: Secondary | ICD-10-CM | POA: Diagnosis not present

## 2018-04-07 DIAGNOSIS — I110 Hypertensive heart disease with heart failure: Secondary | ICD-10-CM | POA: Diagnosis not present

## 2018-04-07 DIAGNOSIS — I509 Heart failure, unspecified: Secondary | ICD-10-CM | POA: Diagnosis not present

## 2018-04-07 DIAGNOSIS — J9600 Acute respiratory failure, unspecified whether with hypoxia or hypercapnia: Secondary | ICD-10-CM | POA: Diagnosis not present

## 2018-04-07 DIAGNOSIS — J189 Pneumonia, unspecified organism: Secondary | ICD-10-CM | POA: Diagnosis not present

## 2018-04-11 DIAGNOSIS — G8929 Other chronic pain: Secondary | ICD-10-CM | POA: Diagnosis not present

## 2018-04-11 DIAGNOSIS — H409 Unspecified glaucoma: Secondary | ICD-10-CM | POA: Diagnosis not present

## 2018-04-11 DIAGNOSIS — I110 Hypertensive heart disease with heart failure: Secondary | ICD-10-CM | POA: Diagnosis not present

## 2018-04-11 DIAGNOSIS — J189 Pneumonia, unspecified organism: Secondary | ICD-10-CM | POA: Diagnosis not present

## 2018-04-11 DIAGNOSIS — I509 Heart failure, unspecified: Secondary | ICD-10-CM | POA: Diagnosis not present

## 2018-04-11 DIAGNOSIS — J9600 Acute respiratory failure, unspecified whether with hypoxia or hypercapnia: Secondary | ICD-10-CM | POA: Diagnosis not present

## 2018-04-12 DIAGNOSIS — J9600 Acute respiratory failure, unspecified whether with hypoxia or hypercapnia: Secondary | ICD-10-CM | POA: Diagnosis not present

## 2018-04-12 DIAGNOSIS — G8929 Other chronic pain: Secondary | ICD-10-CM | POA: Diagnosis not present

## 2018-04-12 DIAGNOSIS — I509 Heart failure, unspecified: Secondary | ICD-10-CM | POA: Diagnosis not present

## 2018-04-12 DIAGNOSIS — H409 Unspecified glaucoma: Secondary | ICD-10-CM | POA: Diagnosis not present

## 2018-04-12 DIAGNOSIS — I110 Hypertensive heart disease with heart failure: Secondary | ICD-10-CM | POA: Diagnosis not present

## 2018-04-12 DIAGNOSIS — J189 Pneumonia, unspecified organism: Secondary | ICD-10-CM | POA: Diagnosis not present

## 2018-04-14 DIAGNOSIS — I110 Hypertensive heart disease with heart failure: Secondary | ICD-10-CM | POA: Diagnosis not present

## 2018-04-14 DIAGNOSIS — I509 Heart failure, unspecified: Secondary | ICD-10-CM | POA: Diagnosis not present

## 2018-04-14 DIAGNOSIS — K5900 Constipation, unspecified: Secondary | ICD-10-CM | POA: Diagnosis not present

## 2018-04-14 DIAGNOSIS — F319 Bipolar disorder, unspecified: Secondary | ICD-10-CM | POA: Diagnosis not present

## 2018-04-14 DIAGNOSIS — G8929 Other chronic pain: Secondary | ICD-10-CM | POA: Diagnosis not present

## 2018-04-14 DIAGNOSIS — J3089 Other allergic rhinitis: Secondary | ICD-10-CM | POA: Diagnosis not present

## 2018-04-14 DIAGNOSIS — H409 Unspecified glaucoma: Secondary | ICD-10-CM | POA: Diagnosis not present

## 2018-04-14 DIAGNOSIS — J9600 Acute respiratory failure, unspecified whether with hypoxia or hypercapnia: Secondary | ICD-10-CM | POA: Diagnosis not present

## 2018-04-14 DIAGNOSIS — J189 Pneumonia, unspecified organism: Secondary | ICD-10-CM | POA: Diagnosis not present

## 2018-04-14 DIAGNOSIS — Z9181 History of falling: Secondary | ICD-10-CM | POA: Diagnosis not present

## 2018-04-14 DIAGNOSIS — E039 Hypothyroidism, unspecified: Secondary | ICD-10-CM | POA: Diagnosis not present

## 2018-04-14 DIAGNOSIS — K210 Gastro-esophageal reflux disease with esophagitis: Secondary | ICD-10-CM | POA: Diagnosis not present

## 2018-04-16 DIAGNOSIS — Z79899 Other long term (current) drug therapy: Secondary | ICD-10-CM | POA: Diagnosis not present

## 2018-04-16 DIAGNOSIS — Z79891 Long term (current) use of opiate analgesic: Secondary | ICD-10-CM | POA: Diagnosis not present

## 2018-04-16 DIAGNOSIS — G894 Chronic pain syndrome: Secondary | ICD-10-CM | POA: Diagnosis not present

## 2018-04-16 DIAGNOSIS — M546 Pain in thoracic spine: Secondary | ICD-10-CM | POA: Diagnosis not present

## 2018-04-16 DIAGNOSIS — M545 Low back pain: Secondary | ICD-10-CM | POA: Diagnosis not present

## 2018-04-27 DIAGNOSIS — H353222 Exudative age-related macular degeneration, left eye, with inactive choroidal neovascularization: Secondary | ICD-10-CM | POA: Diagnosis not present

## 2018-05-17 DIAGNOSIS — J15212 Pneumonia due to Methicillin resistant Staphylococcus aureus: Secondary | ICD-10-CM | POA: Diagnosis not present

## 2018-05-18 ENCOUNTER — Other Ambulatory Visit (HOSPITAL_COMMUNITY): Payer: Self-pay | Admitting: Pulmonary Disease

## 2018-05-18 ENCOUNTER — Other Ambulatory Visit: Payer: Self-pay | Admitting: Pulmonary Disease

## 2018-05-18 DIAGNOSIS — J15212 Pneumonia due to Methicillin resistant Staphylococcus aureus: Secondary | ICD-10-CM

## 2018-06-06 DIAGNOSIS — H353222 Exudative age-related macular degeneration, left eye, with inactive choroidal neovascularization: Secondary | ICD-10-CM | POA: Diagnosis not present

## 2018-07-05 ENCOUNTER — Other Ambulatory Visit: Payer: Self-pay

## 2018-07-05 ENCOUNTER — Ambulatory Visit (HOSPITAL_COMMUNITY)
Admission: RE | Admit: 2018-07-05 | Discharge: 2018-07-05 | Disposition: A | Discharge status: Home / Self Care | Payer: Medicare Other | Source: Ambulatory Visit | Attending: Pulmonary Disease | Admitting: Pulmonary Disease

## 2018-07-05 DIAGNOSIS — X32XXXA Exposure to sunlight, initial encounter: Secondary | ICD-10-CM | POA: Diagnosis not present

## 2018-07-05 DIAGNOSIS — L650 Telogen effluvium: Secondary | ICD-10-CM | POA: Diagnosis not present

## 2018-07-05 DIAGNOSIS — L570 Actinic keratosis: Secondary | ICD-10-CM | POA: Diagnosis not present

## 2018-07-05 DIAGNOSIS — J15212 Pneumonia due to Methicillin resistant Staphylococcus aureus: Secondary | ICD-10-CM | POA: Diagnosis not present

## 2018-07-13 DIAGNOSIS — H401132 Primary open-angle glaucoma, bilateral, moderate stage: Secondary | ICD-10-CM | POA: Diagnosis not present

## 2018-07-13 DIAGNOSIS — H353222 Exudative age-related macular degeneration, left eye, with inactive choroidal neovascularization: Secondary | ICD-10-CM | POA: Diagnosis not present

## 2018-07-18 DIAGNOSIS — Z79899 Other long term (current) drug therapy: Secondary | ICD-10-CM | POA: Diagnosis not present

## 2018-07-25 DIAGNOSIS — W06XXXD Fall from bed, subsequent encounter: Secondary | ICD-10-CM | POA: Diagnosis not present

## 2018-07-25 DIAGNOSIS — H3561 Retinal hemorrhage, right eye: Secondary | ICD-10-CM | POA: Diagnosis not present

## 2018-07-25 DIAGNOSIS — R918 Other nonspecific abnormal finding of lung field: Secondary | ICD-10-CM

## 2018-07-25 DIAGNOSIS — S0232XA Fracture of orbital floor, left side, initial encounter for closed fracture: Secondary | ICD-10-CM | POA: Insufficient documentation

## 2018-07-25 DIAGNOSIS — R1310 Dysphagia, unspecified: Secondary | ICD-10-CM

## 2018-07-25 DIAGNOSIS — H353231 Exudative age-related macular degeneration, bilateral, with active choroidal neovascularization: Secondary | ICD-10-CM | POA: Diagnosis not present

## 2018-07-25 DIAGNOSIS — D72829 Elevated white blood cell count, unspecified: Secondary | ICD-10-CM

## 2018-07-25 DIAGNOSIS — S0232XD Fracture of orbital floor, left side, subsequent encounter for fracture with routine healing: Secondary | ICD-10-CM | POA: Diagnosis not present

## 2018-08-11 DIAGNOSIS — H353231 Exudative age-related macular degeneration, bilateral, with active choroidal neovascularization: Secondary | ICD-10-CM | POA: Diagnosis not present

## 2018-09-09 DIAGNOSIS — F319 Bipolar disorder, unspecified: Secondary | ICD-10-CM | POA: Diagnosis not present

## 2018-09-16 DIAGNOSIS — E559 Vitamin D deficiency, unspecified: Secondary | ICD-10-CM | POA: Diagnosis not present

## 2018-09-16 DIAGNOSIS — E782 Mixed hyperlipidemia: Secondary | ICD-10-CM | POA: Diagnosis not present

## 2018-09-16 DIAGNOSIS — I10 Essential (primary) hypertension: Secondary | ICD-10-CM | POA: Diagnosis not present

## 2018-09-16 DIAGNOSIS — E039 Hypothyroidism, unspecified: Secondary | ICD-10-CM | POA: Diagnosis not present

## 2018-09-23 DIAGNOSIS — H353222 Exudative age-related macular degeneration, left eye, with inactive choroidal neovascularization: Secondary | ICD-10-CM | POA: Diagnosis not present

## 2018-09-23 DIAGNOSIS — H353231 Exudative age-related macular degeneration, bilateral, with active choroidal neovascularization: Secondary | ICD-10-CM | POA: Diagnosis not present

## 2018-10-23 ENCOUNTER — Encounter (HOSPITAL_COMMUNITY): Payer: Self-pay | Admitting: Emergency Medicine

## 2018-10-23 ENCOUNTER — Other Ambulatory Visit: Payer: Self-pay

## 2018-10-23 ENCOUNTER — Emergency Department (HOSPITAL_COMMUNITY)
Admission: EM | Admit: 2018-10-23 | Discharge: 2018-10-23 | Discharge status: Against Medical Advice | Payer: Medicare Other | Attending: Emergency Medicine | Admitting: Emergency Medicine

## 2018-10-23 DIAGNOSIS — X158XXA Contact with other hot household appliances, initial encounter: Secondary | ICD-10-CM | POA: Insufficient documentation

## 2018-10-23 DIAGNOSIS — T23149A Burn of first degree of unspecified multiple fingers (nail), including thumb, initial encounter: Secondary | ICD-10-CM | POA: Diagnosis present

## 2018-10-23 DIAGNOSIS — Y939 Activity, unspecified: Secondary | ICD-10-CM | POA: Diagnosis not present

## 2018-10-23 DIAGNOSIS — Y92009 Unspecified place in unspecified non-institutional (private) residence as the place of occurrence of the external cause: Secondary | ICD-10-CM | POA: Diagnosis not present

## 2018-10-23 DIAGNOSIS — T23101A Burn of first degree of right hand, unspecified site, initial encounter: Secondary | ICD-10-CM

## 2018-10-23 DIAGNOSIS — Y999 Unspecified external cause status: Secondary | ICD-10-CM | POA: Insufficient documentation

## 2018-10-23 DIAGNOSIS — Z5321 Procedure and treatment not carried out due to patient leaving prior to being seen by health care provider: Secondary | ICD-10-CM | POA: Diagnosis not present

## 2018-10-23 NOTE — ED Triage Notes (Signed)
Pt states he burned the tips of her fingers on a curling iron this morning. No blistering and slight redness. States she wants a shot of pain medication.

## 2018-10-23 NOTE — ED Notes (Signed)
Not in [place] when called

## 2018-10-26 DIAGNOSIS — Z79899 Other long term (current) drug therapy: Secondary | ICD-10-CM | POA: Diagnosis not present

## 2018-10-26 DIAGNOSIS — Z79891 Long term (current) use of opiate analgesic: Secondary | ICD-10-CM | POA: Diagnosis not present

## 2018-10-26 DIAGNOSIS — G894 Chronic pain syndrome: Secondary | ICD-10-CM | POA: Diagnosis not present

## 2018-10-26 DIAGNOSIS — Z5181 Encounter for therapeutic drug level monitoring: Secondary | ICD-10-CM | POA: Diagnosis not present

## 2018-10-26 DIAGNOSIS — M546 Pain in thoracic spine: Secondary | ICD-10-CM | POA: Diagnosis not present

## 2018-10-28 DIAGNOSIS — H353231 Exudative age-related macular degeneration, bilateral, with active choroidal neovascularization: Secondary | ICD-10-CM | POA: Diagnosis not present

## 2018-10-28 DIAGNOSIS — H353222 Exudative age-related macular degeneration, left eye, with inactive choroidal neovascularization: Secondary | ICD-10-CM | POA: Diagnosis not present

## 2018-11-18 DIAGNOSIS — Z23 Encounter for immunization: Secondary | ICD-10-CM | POA: Diagnosis not present

## 2018-12-02 DIAGNOSIS — H353231 Exudative age-related macular degeneration, bilateral, with active choroidal neovascularization: Secondary | ICD-10-CM | POA: Diagnosis not present

## 2018-12-02 DIAGNOSIS — H353222 Exudative age-related macular degeneration, left eye, with inactive choroidal neovascularization: Secondary | ICD-10-CM | POA: Diagnosis not present

## 2018-12-23 ENCOUNTER — Other Ambulatory Visit (HOSPITAL_COMMUNITY): Payer: Self-pay | Admitting: Family Medicine

## 2018-12-23 DIAGNOSIS — Z1231 Encounter for screening mammogram for malignant neoplasm of breast: Secondary | ICD-10-CM

## 2019-01-06 DIAGNOSIS — H353231 Exudative age-related macular degeneration, bilateral, with active choroidal neovascularization: Secondary | ICD-10-CM | POA: Diagnosis not present

## 2019-01-06 DIAGNOSIS — H353222 Exudative age-related macular degeneration, left eye, with inactive choroidal neovascularization: Secondary | ICD-10-CM | POA: Diagnosis not present

## 2019-01-12 ENCOUNTER — Ambulatory Visit (HOSPITAL_COMMUNITY): Payer: Medicare Other

## 2019-01-18 ENCOUNTER — Ambulatory Visit (HOSPITAL_COMMUNITY): Payer: Medicare Other

## 2019-02-06 ENCOUNTER — Other Ambulatory Visit: Payer: Self-pay

## 2019-02-06 ENCOUNTER — Ambulatory Visit (HOSPITAL_COMMUNITY)
Admission: RE | Admit: 2019-02-06 | Discharge: 2019-02-06 | Disposition: A | Discharge status: Home / Self Care | Payer: Medicare Other | Source: Ambulatory Visit | Attending: Family Medicine | Admitting: Family Medicine

## 2019-02-06 DIAGNOSIS — Z1231 Encounter for screening mammogram for malignant neoplasm of breast: Secondary | ICD-10-CM | POA: Insufficient documentation

## 2019-02-13 DIAGNOSIS — F329 Major depressive disorder, single episode, unspecified: Secondary | ICD-10-CM | POA: Diagnosis not present

## 2019-02-16 DIAGNOSIS — E782 Mixed hyperlipidemia: Secondary | ICD-10-CM | POA: Diagnosis not present

## 2019-02-16 DIAGNOSIS — M8000XS Age-related osteoporosis with current pathological fracture, unspecified site, sequela: Secondary | ICD-10-CM | POA: Diagnosis not present

## 2019-02-16 DIAGNOSIS — F319 Bipolar disorder, unspecified: Secondary | ICD-10-CM | POA: Diagnosis not present

## 2019-02-16 DIAGNOSIS — J4540 Moderate persistent asthma, uncomplicated: Secondary | ICD-10-CM | POA: Diagnosis not present

## 2019-02-16 DIAGNOSIS — E039 Hypothyroidism, unspecified: Secondary | ICD-10-CM | POA: Diagnosis not present

## 2019-02-16 DIAGNOSIS — E559 Vitamin D deficiency, unspecified: Secondary | ICD-10-CM | POA: Diagnosis not present

## 2019-02-16 DIAGNOSIS — M25562 Pain in left knee: Secondary | ICD-10-CM | POA: Diagnosis not present

## 2019-02-16 DIAGNOSIS — I5032 Chronic diastolic (congestive) heart failure: Secondary | ICD-10-CM | POA: Diagnosis not present

## 2019-02-16 DIAGNOSIS — G8929 Other chronic pain: Secondary | ICD-10-CM | POA: Diagnosis not present

## 2019-02-16 DIAGNOSIS — I10 Essential (primary) hypertension: Secondary | ICD-10-CM | POA: Diagnosis not present

## 2019-02-16 DIAGNOSIS — M1712 Unilateral primary osteoarthritis, left knee: Secondary | ICD-10-CM | POA: Diagnosis not present

## 2019-02-17 DIAGNOSIS — H353222 Exudative age-related macular degeneration, left eye, with inactive choroidal neovascularization: Secondary | ICD-10-CM | POA: Diagnosis not present

## 2019-02-17 DIAGNOSIS — H353231 Exudative age-related macular degeneration, bilateral, with active choroidal neovascularization: Secondary | ICD-10-CM | POA: Diagnosis not present

## 2019-02-28 DIAGNOSIS — M25562 Pain in left knee: Secondary | ICD-10-CM | POA: Diagnosis not present

## 2019-03-07 DIAGNOSIS — M25562 Pain in left knee: Secondary | ICD-10-CM | POA: Diagnosis not present

## 2019-03-14 DIAGNOSIS — M25562 Pain in left knee: Secondary | ICD-10-CM | POA: Diagnosis not present

## 2019-03-16 DIAGNOSIS — M25562 Pain in left knee: Secondary | ICD-10-CM | POA: Diagnosis not present

## 2019-03-21 DIAGNOSIS — I10 Essential (primary) hypertension: Secondary | ICD-10-CM | POA: Diagnosis not present

## 2019-03-21 DIAGNOSIS — M1712 Unilateral primary osteoarthritis, left knee: Secondary | ICD-10-CM | POA: Diagnosis not present

## 2019-03-21 DIAGNOSIS — I5032 Chronic diastolic (congestive) heart failure: Secondary | ICD-10-CM | POA: Diagnosis not present

## 2019-03-21 DIAGNOSIS — G8929 Other chronic pain: Secondary | ICD-10-CM | POA: Diagnosis not present

## 2019-03-21 DIAGNOSIS — M25562 Pain in left knee: Secondary | ICD-10-CM | POA: Diagnosis not present

## 2019-03-21 DIAGNOSIS — E039 Hypothyroidism, unspecified: Secondary | ICD-10-CM | POA: Diagnosis not present

## 2019-03-21 DIAGNOSIS — J4540 Moderate persistent asthma, uncomplicated: Secondary | ICD-10-CM | POA: Diagnosis not present

## 2019-03-21 DIAGNOSIS — E559 Vitamin D deficiency, unspecified: Secondary | ICD-10-CM | POA: Diagnosis not present

## 2019-03-21 DIAGNOSIS — M8000XS Age-related osteoporosis with current pathological fracture, unspecified site, sequela: Secondary | ICD-10-CM | POA: Diagnosis not present

## 2019-03-21 DIAGNOSIS — R42 Dizziness and giddiness: Secondary | ICD-10-CM | POA: Diagnosis not present

## 2019-03-21 DIAGNOSIS — E782 Mixed hyperlipidemia: Secondary | ICD-10-CM | POA: Diagnosis not present

## 2019-03-21 DIAGNOSIS — F319 Bipolar disorder, unspecified: Secondary | ICD-10-CM | POA: Diagnosis not present

## 2019-03-27 DIAGNOSIS — M25562 Pain in left knee: Secondary | ICD-10-CM | POA: Diagnosis not present

## 2019-03-28 DIAGNOSIS — F329 Major depressive disorder, single episode, unspecified: Secondary | ICD-10-CM | POA: Diagnosis not present

## 2019-03-28 DIAGNOSIS — M25562 Pain in left knee: Secondary | ICD-10-CM | POA: Diagnosis not present

## 2019-03-30 DIAGNOSIS — H353222 Exudative age-related macular degeneration, left eye, with inactive choroidal neovascularization: Secondary | ICD-10-CM | POA: Diagnosis not present

## 2019-03-30 DIAGNOSIS — H353221 Exudative age-related macular degeneration, left eye, with active choroidal neovascularization: Secondary | ICD-10-CM | POA: Diagnosis not present

## 2019-04-04 DIAGNOSIS — M25562 Pain in left knee: Secondary | ICD-10-CM | POA: Diagnosis not present

## 2019-04-06 DIAGNOSIS — M25562 Pain in left knee: Secondary | ICD-10-CM | POA: Diagnosis not present

## 2019-04-10 DIAGNOSIS — M25562 Pain in left knee: Secondary | ICD-10-CM | POA: Diagnosis not present

## 2019-04-11 DIAGNOSIS — M25562 Pain in left knee: Secondary | ICD-10-CM | POA: Diagnosis not present

## 2019-04-14 DIAGNOSIS — E039 Hypothyroidism, unspecified: Secondary | ICD-10-CM | POA: Diagnosis not present

## 2019-04-14 DIAGNOSIS — Z5181 Encounter for therapeutic drug level monitoring: Secondary | ICD-10-CM | POA: Diagnosis not present

## 2019-04-14 DIAGNOSIS — E559 Vitamin D deficiency, unspecified: Secondary | ICD-10-CM | POA: Diagnosis not present

## 2019-04-14 DIAGNOSIS — I10 Essential (primary) hypertension: Secondary | ICD-10-CM | POA: Diagnosis not present

## 2019-04-17 DIAGNOSIS — M25562 Pain in left knee: Secondary | ICD-10-CM | POA: Diagnosis not present

## 2019-04-24 DIAGNOSIS — M25562 Pain in left knee: Secondary | ICD-10-CM | POA: Diagnosis not present

## 2019-04-27 DIAGNOSIS — J4540 Moderate persistent asthma, uncomplicated: Secondary | ICD-10-CM | POA: Diagnosis not present

## 2019-04-27 DIAGNOSIS — J069 Acute upper respiratory infection, unspecified: Secondary | ICD-10-CM | POA: Diagnosis not present

## 2019-04-28 DIAGNOSIS — H353231 Exudative age-related macular degeneration, bilateral, with active choroidal neovascularization: Secondary | ICD-10-CM | POA: Diagnosis not present

## 2019-05-04 DIAGNOSIS — F319 Bipolar disorder, unspecified: Secondary | ICD-10-CM | POA: Diagnosis not present

## 2019-05-04 DIAGNOSIS — F329 Major depressive disorder, single episode, unspecified: Secondary | ICD-10-CM | POA: Diagnosis not present

## 2019-05-08 DIAGNOSIS — M25562 Pain in left knee: Secondary | ICD-10-CM | POA: Diagnosis not present

## 2019-05-16 DIAGNOSIS — M25562 Pain in left knee: Secondary | ICD-10-CM | POA: Diagnosis not present

## 2019-05-22 DIAGNOSIS — M25562 Pain in left knee: Secondary | ICD-10-CM | POA: Diagnosis not present

## 2019-05-29 DIAGNOSIS — H353231 Exudative age-related macular degeneration, bilateral, with active choroidal neovascularization: Secondary | ICD-10-CM | POA: Diagnosis not present

## 2019-05-30 DIAGNOSIS — M25562 Pain in left knee: Secondary | ICD-10-CM | POA: Diagnosis not present

## 2019-06-09 DIAGNOSIS — J4541 Moderate persistent asthma with (acute) exacerbation: Secondary | ICD-10-CM | POA: Diagnosis not present

## 2019-06-09 DIAGNOSIS — J189 Pneumonia, unspecified organism: Secondary | ICD-10-CM | POA: Diagnosis not present

## 2019-06-09 DIAGNOSIS — J989 Respiratory disorder, unspecified: Secondary | ICD-10-CM | POA: Diagnosis not present

## 2019-06-22 DIAGNOSIS — D721 Eosinophilia, unspecified: Secondary | ICD-10-CM | POA: Diagnosis not present

## 2019-06-22 DIAGNOSIS — R0602 Shortness of breath: Secondary | ICD-10-CM | POA: Diagnosis not present

## 2019-06-27 ENCOUNTER — Emergency Department (HOSPITAL_COMMUNITY): Payer: Medicare Other

## 2019-06-27 ENCOUNTER — Encounter (HOSPITAL_COMMUNITY): Payer: Self-pay

## 2019-06-27 ENCOUNTER — Emergency Department (HOSPITAL_COMMUNITY)
Admission: EM | Admit: 2019-06-27 | Discharge: 2019-06-27 | Disposition: A | Discharge status: Home / Self Care | Payer: Medicare Other | Attending: Emergency Medicine | Admitting: Emergency Medicine

## 2019-06-27 ENCOUNTER — Other Ambulatory Visit: Payer: Self-pay

## 2019-06-27 DIAGNOSIS — I10 Essential (primary) hypertension: Secondary | ICD-10-CM | POA: Insufficient documentation

## 2019-06-27 DIAGNOSIS — R05 Cough: Secondary | ICD-10-CM | POA: Insufficient documentation

## 2019-06-27 DIAGNOSIS — R0602 Shortness of breath: Secondary | ICD-10-CM | POA: Insufficient documentation

## 2019-06-27 DIAGNOSIS — Z79899 Other long term (current) drug therapy: Secondary | ICD-10-CM | POA: Diagnosis not present

## 2019-06-27 LAB — BASIC METABOLIC PANEL
Anion gap: 13 (ref 5–15)
BUN: 13 mg/dL (ref 8–23)
CO2: 29 mmol/L (ref 22–32)
Calcium: 8.8 mg/dL — ABNORMAL LOW (ref 8.9–10.3)
Chloride: 95 mmol/L — ABNORMAL LOW (ref 98–111)
Creatinine, Ser: 0.9 mg/dL (ref 0.44–1.00)
GFR calc Af Amer: >60 mL/min (ref >60)
GFR calc non Af Amer: >60 mL/min (ref >60)
Glucose, Bld: 97 mg/dL (ref 70–99)
Potassium: 3.2 mmol/L — ABNORMAL LOW (ref 3.5–5.1)
Sodium: 137 mmol/L (ref 135–145)

## 2019-06-27 LAB — CBC WITH DIFFERENTIAL/PLATELET
Abs Immature Granulocytes: 0.02 10*3/uL (ref 0.00–0.07)
Basophils Absolute: 0.1 10*3/uL (ref 0.0–0.1)
Basophils Relative: 1 %
Eosinophils Absolute: 0.2 10*3/uL (ref 0.0–0.5)
Eosinophils Relative: 2 %
HCT: 36.4 % (ref 36.0–46.0)
Hemoglobin: 11.8 g/dL — ABNORMAL LOW (ref 12.0–15.0)
Immature Granulocytes: 0 %
Lymphocytes Relative: 25 %
Lymphs Abs: 2.1 10*3/uL (ref 0.7–4.0)
MCH: 30.3 pg (ref 26.0–34.0)
MCHC: 32.4 g/dL (ref 30.0–36.0)
MCV: 93.6 fL (ref 80.0–100.0)
Monocytes Absolute: 0.9 10*3/uL (ref 0.1–1.0)
Monocytes Relative: 10 %
Neutro Abs: 5.4 10*3/uL (ref 1.7–7.7)
Neutrophils Relative %: 62 %
Platelets: 257 10*3/uL (ref 150–400)
RBC: 3.89 MIL/uL (ref 3.87–5.11)
RDW: 13.2 % (ref 11.5–15.5)
WBC: 8.6 10*3/uL (ref 4.0–10.5)
nRBC: 0 % (ref 0.0–0.2)

## 2019-06-27 LAB — D-DIMER, QUANTITATIVE: D-Dimer, Quant: 0.65 ug/mL-FEU — ABNORMAL HIGH (ref 0.00–0.50)

## 2019-06-27 NOTE — ED Provider Notes (Signed)
[facility] EMERGENCY DEPARTMENT Provider Note   CSN: [id number] Arrival date & time: 06/27/19  1957     History Chief Complaint  Patient presents with  . Shortness of Breath    Heidi Fuentes is a 74 y.o. female.  HPI   Patient presents emergency room for evaluation of shortness of breath.  Patient states she has been having trouble with cough for several weeks.  She was empirically started on antibiotics.  States she has taken amoxicillin and azithromycin and most recently was started on Levaquin.  Patient states her doctor did an x-ray last Thursday that showed a pneumonia.  Patient states she does not really feel any better.  She still is feeling short of breath.  Her doctor told her if she was not getting better to come to the ED where she could be given IV antibiotics.  She denies any fevers.  She is not have any leg swelling.  Past Medical History:  Diagnosis Date  . Arthritis   . Asthma   . Back pain   . Chronic pain syndrome 07/10/2013  . Depression   . Duodenal papillary stenosis   . Dyspnea   . Esophageal dysmotility   . Fibromyalgia   . GERD (gastroesophageal reflux disease)   . Glaucoma   . H/O wheezing   . History of hiatal hernia   . History of palpitations   . HOH (hard of hearing)   . HOH (hard of hearing)   . Hypertension   . Hypothyroidism   . Macular degeneration   . Orthopnea   . Stenosis, spinal, lumbar   . Thyroid disease   . Tubular adenoma   . 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 AKI (acute kidney injury) ([place]) 02/04/2018  . Acute respiratory failure with hypoxia ([place]) 02/04/2018  . Lobar pneumonia ([place]) 02/04/2018  . Asthma, chronic, unspecified asthma severity, with acute exacerbation 02/04/2018  . Leukocytosis   . Lung mass   . CAP (community acquired pneumonia) 02/03/2018  . GERD (gastroesophageal reflux disease) 02/03/2018  . Glaucoma 02/03/2018  . Hypertension 02/03/2018    . Dysphagia   . Hiatal hernia   . Hypokalemia 09/06/2015  . Prolonged Q-T interval on ECG 09/06/2015  . Fall   . IDA (iron deficiency anemia) 12/15/2013  . Schatzki's ring 08/17/2013  . Anemia 08/17/2013  . Encounter for screening colonoscopy 08/17/2013  . Constipation 07/10/2013  . Chronic pain syndrome 07/10/2013  . Depression 07/10/2013  . Hypothyroid 07/09/2013  . Colitis 07/09/2013  . Asthma 07/09/2013    Past Surgical History:  Procedure Laterality Date  . CATARACT EXTRACTION W/PHACO Right 10/06/2016   Procedure: CATARACT EXTRACTION PHACO AND INTRAOCULAR LENS PLACEMENT (IOC);  Surgeon: Birder Robson, MD;  Location: [facility] ORS;  Service: Ophthalmology;  Laterality: Right;  [place] 00:32.5 AP% 14.5 CDE 4.71 Fluid pack lot # [id number]  . CATARACT EXTRACTION W/PHACO Left 11/03/2016   Procedure: CATARACT EXTRACTION PHACO AND INTRAOCULAR LENS PLACEMENT (IOC);  Surgeon: Birder Robson, MD;  Location: [facility] ORS;  Service: Ophthalmology;  Laterality: Left;  [place] 00:36.6 AP% 16.0 CDE 5.86 Fluid Pack lot # [id number]  . COCCYX REMOVAL    . colonoscopy  2005   Dr. Laural Golden: mild melanosis coli, otherwise normal  . COLONOSCOPY WITH PROPOFOL N/A 09/14/2013   [id number] coli. Colonic diverticulosis. Single colonic. Tubular adenoma. Next TCS 09/2020.  [name] ERCP  1997   [facility]: biliary manometry abnormal, subsequent sphincterotomy   .  ESOPHAGEAL MANOMETRY N/A 12/21/2016   Procedure: ESOPHAGEAL MANOMETRY (EM);  Surgeon: Mauri Pole, MD;  [facility]: [facility];  Service: Endoscopy;  Laterality: N/A;  . ESOPHAGOGASTRODUODENOSCOPY (EGD) WITH PROPOFOL N/A 09/14/2013   [id number] ring. Hiatal hernia. Status post Venia Minks and biopsy disruption.   [name] FOOT SURGERY     X2  . HEMORROIDECTOMY    . HERNIA REPAIR     inguinal right  . [place] N/A 09/14/2013   Procedure: Venia Minks DILATION;  Surgeon: [name], MD;  Location: [facility]S;  Service: Endoscopy;  Laterality:  N/A;  56  . POLYPECTOMY N/A 09/14/2013   Procedure: POLYPECTOMY;  Surgeon: [name], MD;  Location: [facility]S;  Service: Endoscopy;  Laterality: N/A;  . TONSILLECTOMY       OB History    Gravida  2   Para  2   Term  2   Preterm      AB      Living        SAB      TAB      Ectopic      Multiple      Live Births              Family History  Problem Relation Age of Onset  . Lung cancer Mother   . Heart attack Father   . Bone cancer Brother   . COPD Sister   . Colon cancer Neg Hx     Social History   Tobacco Use  . Smoking status: Never Smoker  . Smokeless tobacco: Never Used  . Tobacco comment: NEVER SMOKED  Substance Use Topics  . Alcohol use: No  . Drug use: No    Home Medications Prior to Admission medications   Medication Sig Start Date End Date Taking? Authorizing Provider  acetaminophen (TYLENOL) 500 MG tablet Take 500 mg by mouth every 6 (six) hours as needed. For pain    Yes [provider]  albuterol (VENTOLIN HFA) 108 (90 BASE) MCG/ACT inhaler Inhale 2 puffs into the lungs every 6 (six) hours as needed for wheezing or shortness of breath. For shortness of breath    Yes [provider]  Biotin 10000 MCG TABS Take 1 capsule by mouth daily.   Yes [provider]  buPROPion (WELLBUTRIN SR) 200 MG 12 hr tablet Take 200 mg by mouth daily.   Yes [provider]  carbamazepine (TEGRETOL) 200 MG tablet Take 100 mg by mouth in the morning and at bedtime.  11/17/17  Yes [provider]  cetirizine (ZYRTEC) 10 MG tablet Take 10 mg by mouth daily.   Yes [provider]  cholecalciferol (VITAMIN D3) 25 MCG (1000 UNIT) tablet Take 1,000 Units by mouth daily.   Yes [provider]  diltiazem (CARDIZEM) 60 MG tablet Take 60 mg by mouth daily.   Yes [provider]  FLUoxetine (PROZAC) 20 MG capsule Take 20 mg by mouth daily.  12/09/17  Yes [provider]  fluticasone (FLONASE)  50 MCG/ACT nasal spray Place 2 sprays into both nostrils daily.    Yes [provider]  furosemide (LASIX) 40 MG tablet Take 40 mg by mouth daily.   Yes [provider]  gabapentin (NEURONTIN) 100 MG capsule Take 100 mg by mouth 3 (three) times daily.  11/17/17  Yes [provider]  ipratropium-albuterol (DUONEB) 0.5-2.5 (3) MG/3ML SOLN Inhale 3 mLs into the lungs every 6 (six) hours  as needed. 11/18/17  Yes [provider]  lamoTRIgine (LAMICTAL) 200 MG tablet Take 200 mg by mouth 2 (two) times daily. 06/05/19  Yes [provider]  latanoprost (XALATAN) 0.005 % ophthalmic solution Place 1 drop into both eyes at bedtime. 04/20/13  Yes [provider]  levofloxacin (LEVAQUIN) 750 MG tablet Take 750 mg by mouth daily. 7 day course starting on 06/22/2019   Yes [provider]  levothyroxine (SYNTHROID, LEVOTHROID) 50 MCG tablet Take 50 mcg by mouth daily.     Yes [provider]  losartan (COZAAR) 50 MG tablet Take 50 mg by mouth daily.   Yes [provider]  meclizine (ANTIVERT) 25 MG tablet Take 25 mg by mouth 3 (three) times daily as needed for dizziness.  08/02/17  Yes [provider]  Melatonin 3 MG CAPS Take 3 mg by mouth at bedtime as needed (for sleep).  11/17/17  Yes [provider]  montelukast (SINGULAIR) 10 MG tablet Take 10 mg by mouth daily. 05/03/13  Yes [provider]  Multiple Vitamins-Minerals (PRESERVISION/LUTEIN PO) Take 1 tablet by mouth 2 (two) times daily.    Yes [provider]  Oxycodone HCl 10 MG TABS Take 10 mg by mouth every 6 (six) hours.   Yes [provider]  polyethylene glycol powder (GLYCOLAX/MIRALAX) 17 GM/SCOOP powder Take 17 g by mouth daily as needed for mild constipation or moderate constipation.  02/16/18  Yes [provider]  potassium chloride (KLOR-CON) 10 MEQ tablet Take 10 mEq by mouth daily. 06/22/19  Yes [provider]    Potassium Chloride ER 20 MEQ TBCR Take 20 mEq by mouth daily.  01/03/18  Yes [provider]  tiZANidine (ZANAFLEX) 4 MG tablet Take 4 mg by mouth 2 (two) times daily as needed for muscle spasms.   Yes [provider]  SYMBICORT 160-4.5 MCG/ACT inhaler Inhale 2 puffs into the lungs in the morning and at bedtime.  06/16/19   [provider]    Allergies    Abilify [aripiprazole], Geodon [ziprasidone hcl], Trazodone and nefazodone, Quetiapine, and Sulfa antibiotics  Review of Systems   Review of Systems  All other systems reviewed and are negative.   Physical Exam Updated Vital Signs BP (!) 144/79 (BP Location: Right Arm)   Pulse (!) 114   Temp 98 F (36.7 C) (Oral)   Resp (!) 22   Ht 1.524 m (5')   Wt 64.9 kg   SpO2 96%   BMI 27.93 kg/m   Physical Exam Vitals and nursing note reviewed.  Constitutional:      General: She is not in acute distress.    Appearance: She is well-developed.  HENT:     Head: Normocephalic and atraumatic.     Right Ear: External ear normal.     Left Ear: External ear normal.  Eyes:     General: No scleral icterus.       Right eye: No discharge.        Left eye: No discharge.     Conjunctiva/sclera: Conjunctivae normal.  Neck:     Trachea: No tracheal deviation.  Cardiovascular:     Rate and Rhythm: Normal rate and regular rhythm.  Pulmonary:     Effort: Pulmonary effort is normal. No respiratory distress.     Breath sounds: Normal breath sounds. No stridor. No wheezing or rales.  Abdominal:     General: Bowel sounds are normal. There is no distension.     Palpations: Abdomen  is soft.     Tenderness: There is no abdominal tenderness. There is no guarding or rebound.  Musculoskeletal:        General: No tenderness.     Cervical back: Neck supple.  Skin:    General: Skin is warm and dry.     Findings: No rash.  Neurological:     Mental Status: She is alert.     Cranial Nerves: No cranial nerve deficit (no facial  droop, extraocular movements intact, no slurred speech).     Sensory: No sensory deficit.     Motor: No abnormal muscle tone or seizure activity.     Coordination: Coordination normal.     ED Results / Procedures / Treatments   Labs (all labs ordered are listed, but only abnormal results are displayed) Labs Reviewed  CBC WITH DIFFERENTIAL/PLATELET - Abnormal; Notable for the following components:      Result Value   Hemoglobin 11.8 (*)    All other components within normal limits  BASIC METABOLIC PANEL - Abnormal; Notable for the following components:   Potassium 3.2 (*)    Chloride 95 (*)    Calcium 8.8 (*)    All other components within normal limits  D-DIMER, QUANTITATIVE (NOT AT [facility]) - Abnormal; Notable for the following components:   D-Dimer, Quant 0.65 (*)    All other components within normal limits    EKG EKG Interpretation  Date/Time:  Tuesday Jun 27 2019 20:08:18 EDT Ventricular Rate:  113 PR Interval:  132 QRS Duration: 76 QT Interval:  324 QTC Calculation: 444 R Axis:   54 Text Interpretation: Sinus tachycardia Possible Left atrial enlargement Borderline ECG No significant change since last tracing Confirmed by Dorie Rank [phone]) on 06/27/2019 8:22:07 PM   Radiology DG Chest Port 1 View  Result Date: 06/27/2019 CLINICAL DATA:  Shortness of breath. Patient reports recent diagnosis of pneumonia. Persistent shortness of breath. EXAM: PORTABLE CHEST 1 VIEW COMPARISON:  Most recent radiograph 02/05/2026, most recent CT 07/05/2018 FINDINGS: Upper normal heart size. Normal mediastinal contours with aortic atherosclerosis. Bandlike opacity at the left lung base likely atelectasis or scarring. Minimal patchy opacity in the right infrahilar lung. No pulmonary edema. No pneumothorax or large pleural effusion. No acute osseous abnormalities are seen. IMPRESSION: 1. Minimal patchy opacity in the right infrahilar lung, may be atelectasis or pneumonia pneumonia. 2. Bandlike  opacity at the left lung base consistent with atelectasis or scarring. Aortic Atherosclerosis (ICD10-I70.0). Electronically Signed   By: Keith Rake M.D.   On: 06/27/2019 20:40    Procedures Procedures (including critical care time)  Medications Ordered in ED Medications - No data to display  ED Course  I have reviewed the triage vital signs and the nursing notes.  Pertinent labs & imaging results that were available during my care of the patient were reviewed by me and considered in my medical decision making (see chart for details).  Clinical Course as of Jun 27 2206  Tue Jun 27, 2019  2044 Chest x-ray shows minimal atelectasis versus pneumonia.   [JK]  2138 Labs reviewed.  CBC unremarkable.  Electrolyte panel with mild hypokalemia but I doubt this is clinically significant.  D-dimer at 0.65 is within age-adjusted normal range   [JK]    Clinical Course User Index [JK] Dorie Rank, MD   MDM Rules/Calculators/A&P                      Patient presented to ED for evaluation of  shortness of breath.  Patient has had the symptoms for several weeks.  She had an x-ray 1 week ago and was diagnosed with pneumonia.  Patient was told to come to the ED because of her persistent symptoms.  Patient is afebrile here.  She is not hypoxic.  She had mild tachycardia initially but that has returned to normal.  Chest x-ray does not show definitive pneumonia.  There is a questionable area on the right side.  Patient states she was previously told her pneumonia was on the left side.  Patient has a negative age-adjusted D-dimer.  She is afebrile.  She is not hypoxic.  Do not feel that the patient requires IV antibiotics.  She might benefit from outpatient pulmonary consultation and I will give her the number to call to schedule an appointment. Final Clinical Impression(s) / ED Diagnoses Final diagnoses:  SOB (shortness of breath)    Rx / DC Orders ED Discharge Orders    None       Dorie Rank,  MD 06/27/19 2208

## 2019-06-27 NOTE — ED Triage Notes (Signed)
Pt presents to ED with complaints of SOB. Pt states she was diagnosed with pneumonia last Thursday. Pt states her SOB just hasn't gotten better and was told to come to ED if it didn't get better. Pt denies fever.

## 2019-06-27 NOTE — Discharge Instructions (Addendum)
Continue your current medications.  Follow-up with a pulmonary doctor for further evaluation

## 2019-07-14 DIAGNOSIS — H353231 Exudative age-related macular degeneration, bilateral, with active choroidal neovascularization: Secondary | ICD-10-CM | POA: Diagnosis not present

## 2019-07-14 DIAGNOSIS — H353222 Exudative age-related macular degeneration, left eye, with inactive choroidal neovascularization: Secondary | ICD-10-CM | POA: Diagnosis not present

## 2019-07-19 ENCOUNTER — Other Ambulatory Visit (HOSPITAL_COMMUNITY): Payer: Self-pay | Admitting: Family Medicine

## 2019-07-19 DIAGNOSIS — B372 Candidiasis of skin and nail: Secondary | ICD-10-CM | POA: Diagnosis not present

## 2019-07-19 DIAGNOSIS — Z13820 Encounter for screening for osteoporosis: Secondary | ICD-10-CM

## 2019-07-19 DIAGNOSIS — J4541 Moderate persistent asthma with (acute) exacerbation: Secondary | ICD-10-CM | POA: Diagnosis not present

## 2019-07-19 DIAGNOSIS — M8000XS Age-related osteoporosis with current pathological fracture, unspecified site, sequela: Secondary | ICD-10-CM | POA: Diagnosis not present

## 2019-07-19 DIAGNOSIS — F319 Bipolar disorder, unspecified: Secondary | ICD-10-CM | POA: Diagnosis not present

## 2019-07-19 DIAGNOSIS — E039 Hypothyroidism, unspecified: Secondary | ICD-10-CM | POA: Diagnosis not present

## 2019-07-19 DIAGNOSIS — M1712 Unilateral primary osteoarthritis, left knee: Secondary | ICD-10-CM | POA: Diagnosis not present

## 2019-07-19 DIAGNOSIS — I5032 Chronic diastolic (congestive) heart failure: Secondary | ICD-10-CM | POA: Diagnosis not present

## 2019-07-19 DIAGNOSIS — E782 Mixed hyperlipidemia: Secondary | ICD-10-CM | POA: Diagnosis not present

## 2019-07-19 DIAGNOSIS — I10 Essential (primary) hypertension: Secondary | ICD-10-CM | POA: Diagnosis not present

## 2019-07-19 DIAGNOSIS — G8929 Other chronic pain: Secondary | ICD-10-CM | POA: Diagnosis not present

## 2019-07-27 ENCOUNTER — Encounter: Payer: Self-pay | Admitting: Pulmonary Disease

## 2019-07-27 ENCOUNTER — Ambulatory Visit (INDEPENDENT_AMBULATORY_CARE_PROVIDER_SITE_OTHER): Payer: Medicare Other | Admitting: Pulmonary Disease

## 2019-07-27 ENCOUNTER — Telehealth: Payer: Self-pay | Admitting: Pulmonary Disease

## 2019-07-27 ENCOUNTER — Ambulatory Visit (HOSPITAL_COMMUNITY)
Admission: RE | Admit: 2019-07-27 | Discharge: 2019-07-27 | Disposition: A | Discharge status: Home / Self Care | Payer: Medicare Other | Source: Ambulatory Visit | Attending: Pulmonary Disease | Admitting: Pulmonary Disease

## 2019-07-27 ENCOUNTER — Other Ambulatory Visit: Payer: Self-pay | Admitting: Pulmonary Disease

## 2019-07-27 ENCOUNTER — Other Ambulatory Visit: Payer: Self-pay

## 2019-07-27 VITALS — BP 160/80 | HR 88 | Temp 97.6°F | Ht <= 58 in | Wt 145.4 lb

## 2019-07-27 DIAGNOSIS — R0602 Shortness of breath: Secondary | ICD-10-CM | POA: Diagnosis present

## 2019-07-27 DIAGNOSIS — R0600 Dyspnea, unspecified: Secondary | ICD-10-CM | POA: Diagnosis not present

## 2019-07-27 DIAGNOSIS — J45901 Unspecified asthma with (acute) exacerbation: Secondary | ICD-10-CM

## 2019-07-27 DIAGNOSIS — R0609 Other forms of dyspnea: Secondary | ICD-10-CM | POA: Insufficient documentation

## 2019-07-27 MED ORDER — IOHEXOL 350 MG/ML SOLN: 100.0000 mL | Freq: Once | INTRAVENOUS | Status: DC | PRN

## 2019-07-27 NOTE — Telephone Encounter (Signed)
Spoke with Bambi- CT at [facility]  She states pt there for CTA stat per Dr Elsworth Soho and they are having trouble getting the contrast in  Spoke with Dr Elsworth Soho and he is going to call Bambi to discuss what needs to be done

## 2019-07-27 NOTE — Patient Instructions (Signed)
NO reason identified for short ness of breat Stay on asthma inhalers  Ambulatory saturation Proceed with CT angio chest  May need more tests for heart & PFTs

## 2019-07-27 NOTE — Assessment & Plan Note (Signed)
Continue Symbicort 2 puffs twice daily. Continue albuterol as needed as needed. I am not convinced that this is worsening asthma

## 2019-07-27 NOTE — Progress Notes (Signed)
Subjective:    Patient ID: Heidi Fuentes, female    DOB: 1945-09-10, 74 y.o.   MRN: [id number]  HPI  Chief Complaint  Patient presents with  . Consult    Shortness of breath with exertion, denies cough, recent diagnosed with PNA  about 3 weeks ago    74 year old never smoker presents for evaluation of shortness of breath. She had an episode of severe pneumonia 02/2018 was transferred and hospitalized at [facility] for 2 weeks.. Review of imaging shows bilateral masslike consolidations, and this was initially felt to be malignancy but eventually clarified to be MRSA and Pseudomonas pneumonia.  This seems to have fully resolved on follow-up imaging which I reviewed. 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  She used to be on Advair for several years and this was stopped since symptoms were felt to be intermittent after pulmonology follow-up at [facility] in 05/2018.  She has now resumed Symbicort and Singulair  She reports dyspnea on exertion especially when hurrying, she reports diaphoresis, worsening over the past 6 months.  She denies chest tightness or wheezing which she associates with her asthma.  There is no worsening of her chronic pedal edema, she denies orthopnea, chest pain or proximal nocturnal dyspnea. She was treated in May for pneumonia by her PCP with amoxicillin and Z-Pak Then she had an ED visit 5/25 which I reviewed, chest x-ray was performed which I reviewed which showed right infrahilar infiltrate patchy and left lower lobe bandlike opacity, D-dimer was 0.69 and she was noted to be hypokalemic She was eventually treated with Levaquin for this  On ambulation, saturation stayed between 100 to 97% on walking 3 laps at her own  pace   Significant tests/ events reviewed  CT chest 07/2018 shows resolution of consolidation with small 4 to 5 mm bilateral lung nodules  Echo 03/2015 shows normal LVEF, grade 1 diastolic dysfunction She reports lifelong history of asthma    Past Medical History:  Diagnosis Date  . Arthritis   . Asthma   . Back pain   . Chronic pain syndrome 07/10/2013  . Depression   . Duodenal papillary stenosis   . Dyspnea   . Esophageal dysmotility   . Fibromyalgia   . GERD (gastroesophageal reflux disease)   . Glaucoma   . H/O wheezing   . History of hiatal hernia   . History of palpitations   . HOH (hard of hearing)   . HOH (hard of hearing)   . Hypertension   . Hypothyroidism   . Macular degeneration   . Orthopnea   . Stenosis, spinal, lumbar   . Thyroid disease   . Tubular adenoma   . 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 CATARACT EXTRACTION W/PHACO Right 10/06/2016   Procedure: CATARACT EXTRACTION PHACO AND INTRAOCULAR LENS PLACEMENT (IOC);  Surgeon: Birder Robson, MD;  Location: [facility] ORS;  Service: Ophthalmology;  Laterality: Right;  [place] 00:32.5 AP% 14.5 CDE 4.71 Fluid pack lot # [id number]  . CATARACT EXTRACTION W/PHACO Left 11/03/2016   Procedure: CATARACT EXTRACTION PHACO AND INTRAOCULAR LENS PLACEMENT (IOC);  Surgeon: Birder Robson, MD;  Location: [facility] ORS;  Service: Ophthalmology;  Laterality: Left;  [place] 00:36.6 AP% 16.0 CDE 5.86 Fluid Pack lot # [id number]  . COCCYX REMOVAL    . colonoscopy  2005   Dr. Laural Golden: mild melanosis coli, otherwise normal  . COLONOSCOPY WITH PROPOFOL N/A 09/14/2013   [id number] coli. Colonic diverticulosis. Single colonic. Tubular adenoma. Next TCS 09/2020.  [name] ERCP  1997   [facility]: biliary manometry abnormal, subsequent sphincterotomy   . ESOPHAGEAL MANOMETRY N/A 12/21/2016   Procedure: ESOPHAGEAL MANOMETRY (EM);  Surgeon: Mauri Pole, MD;  [facility];  Service:  Endoscopy;  Laterality: N/A;  . ESOPHAGOGASTRODUODENOSCOPY (EGD) WITH PROPOFOL N/A 09/14/2013   WJX:[id number]'A ring. Hiatal hernia. Status post Venia Minks and biopsy disruption.   [name] FOOT SURGERY     X2  . HEMORROIDECTOMY    . HERNIA REPAIR     inguinal right  . [place] N/A 09/14/2013   Procedure: Venia Minks DILATION;  Surgeon: [name], MD;  Location: [facility];  Service: Endoscopy;  Laterality: N/A;  56  . POLYPECTOMY N/A 09/14/2013   Procedure: POLYPECTOMY;  Surgeon: [name], MD;  Location: [facility];  Service: Endoscopy;  Laterality: N/A;  . TONSILLECTOMY      Allergies  Allergen Reactions  . Abilify [Aripiprazole]     Bad dreams  . Geodon [Ziprasidone Hcl]     hospitalization specifics  . Trazodone And Nefazodone Other (See Comments)    Reaction is unknown  . Quetiapine Palpitations  . Sulfa Antibiotics Rash    Social History   Socioeconomic History  . Marital status: Divorced    Spouse name: Not on file  . Number of children: Not on file  . Years of education: Not on file  . Highest education level: Not on file  Occupational History  . Not on file  Tobacco Use  . Smoking status: Never Smoker  . Smokeless tobacco: Never Used  . Tobacco comment: NEVER SMOKED  Vaping Use  . Vaping Use: Never used  Substance and Sexual Activity  . Alcohol use: No  . Drug use: No  . Sexual activity: Not on file  Other Topics Concern  . Not on file  Social History Narrative  . Not on file   Social Determinants of Health   Financial Resource Strain:   . Difficulty of Paying Living Expenses:   Food Insecurity:   . Worried About Charity fundraiser in the Last Year:   . Arboriculturist in the Last Year:   Transportation Needs:   . Film/video editor (Medical):   [name] Lack of Transportation (Non-Medical):   Physical Activity:   . Days of Exercise per Week:   . Minutes of Exercise per Session:   Stress:   . Feeling of Stress :   Social  Connections:   . Frequency of Communication with Friends and Family:   . Frequency of Social Gatherings with Friends and Family:   . Attends Religious Services:   . Active Member of Clubs or Organizations:   . Attends Archivist Meetings:   [name] Marital Status:   Intimate Partner Violence:   . Fear of Current or Ex-Partner:   . Emotionally Abused:   [name] Physically Abused:   . Sexually Abused:      Family History  Problem Relation Age of Onset  . Lung cancer Mother   . Heart attack Father   . Bone cancer Brother   . COPD Sister   . Colon cancer Neg Hx  Review of Systems Positive for shortness of breath, no chest tightness or wheezing Positive for pedal edema, no orthopnea proximal nocturnal dyspnea or chest pains. No nasal congestion, itchy eyes. Symptoms of depression Chronic back pain Poor vision and eyes due to macular degeneration    Objective:   Physical Exam   Gen. Pleasant, well-nourished, elderly, in no distress, normal affect ENT - no pallor,icterus, no post nasal drip Neck: No JVD, no thyromegaly, no carotid bruits Lungs: no use of accessory muscles, no dullness to percussion, LT basal rales no rhonchi  Cardiovascular: Rhythm regular, heart sounds  normal, no murmurs or gallops, 1+ peripheral edema Abdomen: soft and non-tender, no hepatosplenomegaly, BS normal. Musculoskeletal: No deformities, no cyanosis or clubbing Neuro:  alert, non focal        Assessment & Plan:

## 2019-07-27 NOTE — Assessment & Plan Note (Signed)
Cause of dyspnea is unclear to me today.  This does not seem to be typical for her asthma-she does not have chest tightness or wheezing. She is compliant with her Symbicort She does not seem to have postinflammatory fibrosis based on her prior CT from 07/2018. Although there is bandlike opacity noted on current chest x-ray.  She had severe pneumonia in January 2020 with MRSA and Pseudomonas -she does not seem to have any risk factors for these bugs other than chronic pain syndrome on narcotics.  I wonder if silent aspiration is possible. She does not have any risk factors of for VTE, D-dimer was borderline positive during ED visit. We will proceed with CT angiogram chest -this will rule out PE , at the same time give [place] visualization of parenchyma in the lower lobe to see what is accounting for crackles.  If this is nondiagnostic, then we may have to proceed with echo and PFTs to rule out other causes of dyspnea

## 2019-07-28 ENCOUNTER — Other Ambulatory Visit: Payer: Self-pay

## 2019-07-28 DIAGNOSIS — R0602 Shortness of breath: Secondary | ICD-10-CM

## 2019-07-28 DIAGNOSIS — R0609 Other forms of dyspnea: Secondary | ICD-10-CM

## 2019-07-28 DIAGNOSIS — J45901 Unspecified asthma with (acute) exacerbation: Secondary | ICD-10-CM

## 2019-08-01 ENCOUNTER — Other Ambulatory Visit: Payer: Self-pay

## 2019-08-01 ENCOUNTER — Other Ambulatory Visit (HOSPITAL_COMMUNITY)
Admission: RE | Admit: 2019-08-01 | Discharge: 2019-08-01 | Disposition: A | Discharge status: Home / Self Care | Payer: Medicare Other | Source: Ambulatory Visit | Attending: Pulmonary Disease | Admitting: Pulmonary Disease

## 2019-08-01 DIAGNOSIS — R0602 Shortness of breath: Secondary | ICD-10-CM | POA: Diagnosis present

## 2019-08-01 DIAGNOSIS — R0600 Dyspnea, unspecified: Secondary | ICD-10-CM | POA: Insufficient documentation

## 2019-08-01 LAB — D-DIMER, QUANTITATIVE: D-Dimer, Quant: 0.56 ug/mL-FEU — ABNORMAL HIGH (ref 0.00–0.50)

## 2019-08-02 NOTE — Progress Notes (Signed)
ATC, NA and no VM set up so unable to LM

## 2019-08-21 DIAGNOSIS — H353231 Exudative age-related macular degeneration, bilateral, with active choroidal neovascularization: Secondary | ICD-10-CM | POA: Diagnosis not present

## 2019-08-21 DIAGNOSIS — H353222 Exudative age-related macular degeneration, left eye, with inactive choroidal neovascularization: Secondary | ICD-10-CM | POA: Diagnosis not present

## 2019-09-07 ENCOUNTER — Encounter: Payer: Self-pay | Admitting: Pulmonary Disease

## 2019-09-07 ENCOUNTER — Other Ambulatory Visit: Payer: Self-pay

## 2019-09-07 ENCOUNTER — Ambulatory Visit (INDEPENDENT_AMBULATORY_CARE_PROVIDER_SITE_OTHER): Payer: Medicare Other | Admitting: Pulmonary Disease

## 2019-09-07 VITALS — BP 144/74 | HR 77 | Temp 98.4°F | Ht 60.0 in | Wt 144.0 lb

## 2019-09-07 DIAGNOSIS — R0600 Dyspnea, unspecified: Secondary | ICD-10-CM | POA: Diagnosis not present

## 2019-09-07 DIAGNOSIS — J45901 Unspecified asthma with (acute) exacerbation: Secondary | ICD-10-CM

## 2019-09-07 DIAGNOSIS — J4530 Mild persistent asthma, uncomplicated: Secondary | ICD-10-CM

## 2019-09-07 DIAGNOSIS — R0609 Other forms of dyspnea: Secondary | ICD-10-CM

## 2019-09-07 MED ORDER — SODIUM CHLORIDE 3 % IN NEBU
INHALATION_SOLUTION | Freq: Two times a day (BID) | RESPIRATORY_TRACT | 0 refills | Status: AC
Start: 2019-09-07 — End: 2019-09-21

## 2019-09-07 NOTE — Progress Notes (Signed)
   Subjective:    Patient ID: Heidi Fuentes, female    DOB: 1945-10-23, 74 y.o.   MRN: [id number]  HPI 74 year old never smoker  for FU of shortness of breath  02/2018 >> severe MRSA and Pseudomonas pneumonia.   She reports lifelong history of asthma starting as a child, grew out of this as an adult but this is recurrent over the past few years.  She used to be on Advair for several years and this was stopped since symptoms were felt to be intermittent after pulmonology follow-up at [facility] in 05/2018.  She is back on Symbicort and Singulair  PMH - chronic pain on oxycodone  Chief Complaint  Patient presents with  . Follow-up    Patient is feeling the same since last visit. Still has shortness of breath and sweating when exerting herself. States she is wheezing when she breaths in   On her last visit, CT chest was obtained, D-dimer was mildly elevated, unfortunately angiogram could not be obtained due to IV access and we obtained CT chest without contrast which I reviewed today She is compliant with Symbicort but continues to complain of shortness of breath and sweating on exertion.  Wheezing is mostly when she breathes in  Again oxygen saturation appears good  Significant tests/ events reviewed 07/27/19 OV >> On ambulation, saturation stayed between 100 to 97% on walking 3 laps at her own pace  CT chest WO contrast 07/2019 >> Progressive postinflammatory or postinfectious scarring and subsegmental atelectasis in both lower lobes ,mild bronchiectasis  CT chest 07/2018 shows resolution of consolidation with small 4 to 5 mm bilateral lung nodules. 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 Pleasant, well-nourished, in no distress ENT - no thrush, no pallor/icterus,no post nasal drip Neck: No JVD, no thyromegaly, no carotid bruits Lungs: no use of accessory muscles, no dullness to percussion, bibasal dry rales no rhonchi  Cardiovascular: Rhythm regular, heart sounds  normal, no murmurs or gallops, no peripheral edema Musculoskeletal: No deformities, no cyanosis or clubbing        Assessment & Plan:

## 2019-09-07 NOTE — Patient Instructions (Signed)
Schedule echocardiogram & PFTs 3% saline nebs twice daily x 2 weeks  - see if this helps with wheezing Stay on symbicort & albuterol MDI

## 2019-09-07 NOTE — Assessment & Plan Note (Signed)
Continue on Symbicort. I am not convinced that her current symptoms are related to asthma and so we will look for other etiologies

## 2019-09-07 NOTE — Assessment & Plan Note (Signed)
Will obtain PFTs She does have mild bronchiectasis which explains the crackles, no evidence of ILD, there is postinflammatory scarring but this is not impressive. We will provide her with hypertonic saline nebs to see if airway clearance will help with her wheezing symptoms.  Tracheomalacia was noted on her CT when she had the severe pneumonia but this seems to have resolved on follow-up imaging. We will also obtain echo to rule out cardiac etiology

## 2019-09-11 ENCOUNTER — Ambulatory Visit (HOSPITAL_COMMUNITY): Payer: Medicare Other

## 2019-09-13 ENCOUNTER — Other Ambulatory Visit: Payer: Self-pay

## 2019-09-13 ENCOUNTER — Ambulatory Visit (HOSPITAL_COMMUNITY)
Admission: RE | Admit: 2019-09-13 | Discharge: 2019-09-13 | Disposition: A | Discharge status: Home / Self Care | Payer: Medicare Other | Source: Ambulatory Visit | Attending: Pulmonary Disease | Admitting: Pulmonary Disease

## 2019-09-13 DIAGNOSIS — R0600 Dyspnea, unspecified: Secondary | ICD-10-CM

## 2019-09-13 DIAGNOSIS — J45901 Unspecified asthma with (acute) exacerbation: Secondary | ICD-10-CM | POA: Diagnosis not present

## 2019-09-13 DIAGNOSIS — R0609 Other forms of dyspnea: Secondary | ICD-10-CM

## 2019-09-13 LAB — ECHOCARDIOGRAM COMPLETE
AR max vel: 2.19 cm2
AV Area VTI: 2.5 cm2
AV Area mean vel: 2.37 cm2
AV Mean grad: 4.9 mmHg
AV Peak grad: 9.2 mmHg
Ao pk vel: 1.52 m/s
Area-P 1/2: 2.95 cm2
S' Lateral: 2.92 cm

## 2019-09-13 NOTE — Progress Notes (Signed)
*  PRELIMINARY RESULTS* Echocardiogram 2D Echocardiogram has been performed.  Samuel Germany 09/13/2019, 2:56 PM

## 2019-09-29 DIAGNOSIS — H353231 Exudative age-related macular degeneration, bilateral, with active choroidal neovascularization: Secondary | ICD-10-CM | POA: Diagnosis not present

## 2019-10-11 DIAGNOSIS — F329 Major depressive disorder, single episode, unspecified: Secondary | ICD-10-CM | POA: Diagnosis not present

## 2019-10-11 DIAGNOSIS — F319 Bipolar disorder, unspecified: Secondary | ICD-10-CM | POA: Diagnosis not present

## 2019-10-16 ENCOUNTER — Other Ambulatory Visit: Payer: Self-pay

## 2019-10-16 ENCOUNTER — Other Ambulatory Visit (HOSPITAL_COMMUNITY)
Admission: RE | Admit: 2019-10-16 | Discharge: 2019-10-16 | Disposition: A | Discharge status: Home / Self Care | Payer: Medicare Other | Source: Ambulatory Visit | Attending: Pulmonary Disease | Admitting: Pulmonary Disease

## 2019-10-16 DIAGNOSIS — Z01812 Encounter for preprocedural laboratory examination: Secondary | ICD-10-CM | POA: Insufficient documentation

## 2019-10-16 DIAGNOSIS — Z20822 Contact with and (suspected) exposure to covid-19: Secondary | ICD-10-CM | POA: Insufficient documentation

## 2019-10-16 LAB — SARS CORONAVIRUS 2 (TAT 6-24 HRS): SARS Coronavirus 2: NEGATIVE

## 2019-10-18 ENCOUNTER — Other Ambulatory Visit: Payer: Self-pay

## 2019-10-18 ENCOUNTER — Ambulatory Visit (HOSPITAL_COMMUNITY)
Admission: RE | Admit: 2019-10-18 | Discharge: 2019-10-18 | Disposition: A | Discharge status: Home / Self Care | Payer: Medicare Other | Source: Ambulatory Visit | Attending: Pulmonary Disease | Admitting: Pulmonary Disease

## 2019-10-18 DIAGNOSIS — J45901 Unspecified asthma with (acute) exacerbation: Secondary | ICD-10-CM

## 2019-10-18 DIAGNOSIS — R0609 Other forms of dyspnea: Secondary | ICD-10-CM

## 2019-10-18 DIAGNOSIS — R0600 Dyspnea, unspecified: Secondary | ICD-10-CM | POA: Diagnosis not present

## 2019-10-18 LAB — PULMONARY FUNCTION TEST
DL/VA % pred: 104 %
DL/VA: 4.41 ml/min/mmHg/L
DLCO unc % pred: 92 %
DLCO unc: 15.34 ml/min/mmHg
FEF 25-75 Post: 0.62 L/sec
FEF 25-75 Pre: 0.36 L/sec
FEF2575-%Change-Post: 73 %
FEF2575-%Pred-Post: 41 %
FEF2575-%Pred-Pre: 24 %
FEV1-%Change-Post: 20 %
FEV1-%Pred-Post: 60 %
FEV1-%Pred-Pre: 50 %
FEV1-Post: 1.07 L
FEV1-Pre: 0.89 L
FEV1FVC-%Change-Post: 8 %
FEV1FVC-%Pred-Pre: 73 %
FEV6-%Change-Post: 13 %
FEV6-%Pred-Post: 78 %
FEV6-%Pred-Pre: 69 %
FEV6-Post: 1.76 L
FEV6-Pre: 1.56 L
FEV6FVC-%Change-Post: 2 %
FEV6FVC-%Pred-Post: 104 %
FEV6FVC-%Pred-Pre: 102 %
FVC-%Change-Post: 10 %
FVC-%Pred-Post: 74 %
FVC-%Pred-Pre: 67 %
FVC-Post: 1.78 L
FVC-Pre: 1.61 L
Post FEV1/FVC ratio: 60 %
Post FEV6/FVC ratio: 99 %
Pre FEV1/FVC ratio: 55 %
Pre FEV6/FVC Ratio: 97 %
RV % pred: 117 %
RV: 2.43 L
TLC % pred: 92 %
TLC: 4.14 L

## 2019-10-18 MED ORDER — ALBUTEROL SULFATE (2.5 MG/3ML) 0.083% IN NEBU
2.5000 mg | INHALATION_SOLUTION | Freq: Once | RESPIRATORY_TRACT | Status: AC
  Administered 2019-10-18: 2.5 mg via RESPIRATORY_TRACT

## 2019-10-20 NOTE — Progress Notes (Signed)
Called and spoke with patient about PFT results per Dr Elsworth Soho. All questions answered and patient expressed understanding. Nothing further needed. Confirmed upcoming appointment with Dr Elsworth Soho.

## 2019-10-25 DIAGNOSIS — I10 Essential (primary) hypertension: Secondary | ICD-10-CM | POA: Diagnosis not present

## 2019-10-25 DIAGNOSIS — L749 Eccrine sweat disorder, unspecified: Secondary | ICD-10-CM | POA: Diagnosis not present

## 2019-10-25 DIAGNOSIS — J4541 Moderate persistent asthma with (acute) exacerbation: Secondary | ICD-10-CM | POA: Diagnosis not present

## 2019-10-25 DIAGNOSIS — M1712 Unilateral primary osteoarthritis, left knee: Secondary | ICD-10-CM | POA: Diagnosis not present

## 2019-10-25 DIAGNOSIS — J3089 Other allergic rhinitis: Secondary | ICD-10-CM | POA: Diagnosis not present

## 2019-10-25 DIAGNOSIS — Z131 Encounter for screening for diabetes mellitus: Secondary | ICD-10-CM | POA: Diagnosis not present

## 2019-10-25 DIAGNOSIS — G8929 Other chronic pain: Secondary | ICD-10-CM | POA: Diagnosis not present

## 2019-10-25 DIAGNOSIS — E559 Vitamin D deficiency, unspecified: Secondary | ICD-10-CM | POA: Diagnosis not present

## 2019-10-25 DIAGNOSIS — M8000XS Age-related osteoporosis with current pathological fracture, unspecified site, sequela: Secondary | ICD-10-CM | POA: Diagnosis not present

## 2019-10-25 DIAGNOSIS — E782 Mixed hyperlipidemia: Secondary | ICD-10-CM | POA: Diagnosis not present

## 2019-10-25 DIAGNOSIS — M25552 Pain in left hip: Secondary | ICD-10-CM | POA: Diagnosis not present

## 2019-10-25 DIAGNOSIS — Z23 Encounter for immunization: Secondary | ICD-10-CM | POA: Diagnosis not present

## 2019-10-25 DIAGNOSIS — R7303 Prediabetes: Secondary | ICD-10-CM | POA: Diagnosis not present

## 2019-10-25 DIAGNOSIS — E039 Hypothyroidism, unspecified: Secondary | ICD-10-CM | POA: Diagnosis not present

## 2019-10-30 ENCOUNTER — Encounter: Payer: Self-pay | Admitting: Orthopedic Surgery

## 2019-10-31 ENCOUNTER — Ambulatory Visit (INDEPENDENT_AMBULATORY_CARE_PROVIDER_SITE_OTHER): Payer: Medicare Other | Admitting: Pulmonary Disease

## 2019-10-31 ENCOUNTER — Other Ambulatory Visit: Payer: Self-pay

## 2019-10-31 ENCOUNTER — Encounter: Payer: Self-pay | Admitting: Pulmonary Disease

## 2019-10-31 DIAGNOSIS — J479 Bronchiectasis, uncomplicated: Secondary | ICD-10-CM

## 2019-10-31 DIAGNOSIS — J45901 Unspecified asthma with (acute) exacerbation: Secondary | ICD-10-CM

## 2019-10-31 MED ORDER — BREZTRI AEROSPHERE 160-9-4.8 MCG/ACT IN AERO
2.0000 | INHALATION_SPRAY | Freq: Two times a day (BID) | RESPIRATORY_TRACT | 0 refills | Status: DC
Start: 2019-10-31 — End: 2020-01-23

## 2019-10-31 NOTE — Assessment & Plan Note (Signed)
Bronchiectasis is mild, do not think this is playing a role in airway obstruction, tracheomalacia noted on previous CT scan had resolved after resolution of pneumonia. Feel like bronchiectasis is likely sequelae of severe pneumonia

## 2019-10-31 NOTE — Patient Instructions (Signed)
Lung function test suggests asthma with improvement after albuterol use  Sample of Breztri -2 puffs twice daily, rinse mouth after use Call for prescription if this works.  Prescription for spacer x1  If you remain short of breath, okay to take duo nebs once daily around 11 AM

## 2019-10-31 NOTE — Assessment & Plan Note (Signed)
Reviewed PFTs which showed reversible airway obstruction suggestive of asthma, wonder if she has a fixed component since postbronchodilator FEV1 was still only 60%  Sample of Breztri -2 puffs twice daily, rinse mouth after use Call for prescription if this works.  Prescription for spacer x1  If you remain short of breath, okay to take duo nebs once daily around 11 AM

## 2019-10-31 NOTE — Progress Notes (Signed)
° °  Subjective:    Patient ID: Chauncy Lean, female    DOB: 01-24-1946, 74 y.o.   MRN: [id number]  HPI  74 year old never smoker for follow-up of asthma, mild bronchiectasis and persistent shortness of breath  02/2018 >> severe MRSA and Pseudomonas pneumonia.  She reports lifelong history of asthma starting as a child, grew out of this as an adult but this is recurrent over the past few years. She was on Advair for several years and this was stopped since symptoms were felt to be intermittent after pulmonology follow-up at [facility] in 05/2018.   PMH - chronic pain on oxycodone  She complains of persistent shortness of breath since her initial visit 07/2019.  She is not taking Symbicort, and is on fluticasone/salmeterol/air duo due to Symbicort being too expensive. We reviewed echo and PFTs today. Duo nebs seem to provide some relief,  Her son, unvaccinated was hospitalized in the ICU for 15 days and is just about to come home.  She went in to visit,   Significant tests/ events reviewed  AEC 200  PFTs 10/2019 moderate airway obstruction with ratio 55, FEV1 0.89/50%, improved to 1.07/60% with bronchodilator, FVC 67%/1.78  07/27/19 OV >> On ambulation, saturation stayed between 100 to 97% on walking 3 laps at her own pace  CT chest WO contrast 07/2019 >> Progressive postinflammatory or postinfectious scarring and subsegmental atelectasis in both lower lobes ,mild bronchiectasis  CT chest 07/2018 shows resolution of consolidation with small 4 to 5 mm bilateral lung nodules. 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 Also denies presyncope,  palpitations, heartburn, abdominal pain, nausea, vomiting, diarrhea or change in bowel or urinary habits, dysuria,hematuria, rash, arthralgias, visual complaints, headache, numbness weakness or ataxia.     Objective:   Physical Exam  Gen. Pleasant, well-nourished, in no distress ENT - no thrush, no pallor/icterus,no post nasal drip Neck: No JVD, no thyromegaly, no carotid bruits Lungs: no use of accessory muscles, no dullness to percussion, RT basal rales no rhonchi  Cardiovascular: Rhythm regular, heart sounds  normal, no murmurs or gallops, no peripheral edema Musculoskeletal: No deformities, no cyanosis or clubbing        Assessment & Plan:

## 2019-11-03 DIAGNOSIS — H353231 Exudative age-related macular degeneration, bilateral, with active choroidal neovascularization: Secondary | ICD-10-CM | POA: Diagnosis not present

## 2019-11-17 ENCOUNTER — Other Ambulatory Visit: Payer: Self-pay

## 2019-11-17 ENCOUNTER — Ambulatory Visit (HOSPITAL_COMMUNITY)
Admission: RE | Admit: 2019-11-17 | Discharge: 2019-11-17 | Disposition: A | Discharge status: Home / Self Care | Payer: Medicare Other | Source: Ambulatory Visit | Attending: Family Medicine | Admitting: Family Medicine

## 2019-11-17 DIAGNOSIS — Z780 Asymptomatic menopausal state: Secondary | ICD-10-CM | POA: Diagnosis not present

## 2019-11-17 DIAGNOSIS — Z13820 Encounter for screening for osteoporosis: Secondary | ICD-10-CM | POA: Insufficient documentation

## 2019-11-17 DIAGNOSIS — Z87828 Personal history of other (healed) physical injury and trauma: Secondary | ICD-10-CM | POA: Diagnosis not present

## 2019-11-17 DIAGNOSIS — M8589 Other specified disorders of bone density and structure, multiple sites: Secondary | ICD-10-CM | POA: Diagnosis not present

## 2019-11-30 ENCOUNTER — Ambulatory Visit: Payer: Medicare Other | Admitting: Orthopaedic Surgery

## 2019-12-07 ENCOUNTER — Ambulatory Visit (INDEPENDENT_AMBULATORY_CARE_PROVIDER_SITE_OTHER): Payer: Medicare Other | Admitting: Orthopaedic Surgery

## 2019-12-07 ENCOUNTER — Encounter: Payer: Self-pay | Admitting: Orthopaedic Surgery

## 2019-12-07 ENCOUNTER — Other Ambulatory Visit: Payer: Self-pay

## 2019-12-07 VITALS — BP 156/84 | HR 77 | Ht 60.0 in | Wt 150.0 lb

## 2019-12-07 DIAGNOSIS — G8929 Other chronic pain: Secondary | ICD-10-CM

## 2019-12-07 DIAGNOSIS — M1612 Unilateral primary osteoarthritis, left hip: Secondary | ICD-10-CM

## 2019-12-07 NOTE — Patient Instructions (Signed)
[phone].    

## 2019-12-07 NOTE — Progress Notes (Signed)
Subjective:    Patient ID: Heidi Fuentes, female    DOB: 18-Dec-1945, 74 y.o.   MRN: [id number]  HPI She has long history of left hip pain that has gotten worse over the last three months.  She has no trauma.  She has pain after sitting a while. She has pain when getting up. She has pain more in the morning.  She has pain after walking a distance. She cannot take NSAIDs because of prior hiatal hernia surgery.  She has been seen at [facility].  I have reviewed their notes.  She brings in CD of the hip joint x-rays.  I have reviewed them as well.  She has DJD of the hip joint and narrowing.  I have independently reviewed and interpreted x-rays of this patient done at another site by another physician or qualified health professional.  She has chronic back pain and is on narcotics for this.  Review of Systems  Constitutional: Positive for activity change.  Respiratory: Positive for shortness of breath.   Musculoskeletal: Positive for arthralgias, back pain and gait problem.  All other systems reviewed and are negative.  For Review of Systems, all other systems reviewed and are negative.  The following is a summary of the past history medically, past history surgically, known current medicines, social history and family history.  This information is gathered electronically by the computer from prior information and documentation.  I review this each visit and have found including this information at this point in the chart is beneficial and informative.   Past Medical History:  Diagnosis Date  . Arthritis   . Asthma   . Back pain   . Chronic pain syndrome 07/10/2013  . Depression   . Duodenal papillary stenosis   . Dyspnea   . Esophageal dysmotility   . Fibromyalgia   . GERD (gastroesophageal reflux disease)   . Glaucoma   . H/O wheezing   . History of hiatal hernia   . History of palpitations   . HOH (hard of hearing)   . HOH (hard of hearing)   . Hypertension   .  Hypothyroidism   . Macular degeneration   . Orthopnea   . Stenosis, spinal, lumbar   . Thyroid disease   . Tubular adenoma   . 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[facility])     Past Surgical History:  Procedure Laterality Date  . CATARACT EXTRACTION W/PHACO Right 10/06/2016   Procedure: CATARACT EXTRACTION PHACO AND INTRAOCULAR LENS PLACEMENT (IOC);  Surgeon: Birder Robson, MD;  Location: [facility] ORS;  Service: Ophthalmology;  Laterality: Right;  [place] 00:32.5 AP% 14.5 CDE 4.71 Fluid pack lot # [id number]  . CATARACT EXTRACTION W/PHACO Left 11/03/2016   Procedure: CATARACT EXTRACTION PHACO AND INTRAOCULAR LENS PLACEMENT (IOC);  Surgeon: Birder Robson, MD;  Location: [facility] ORS;  Service: Ophthalmology;  Laterality: Left;  [place] 00:36.6 AP% 16.0 CDE 5.86 Fluid Pack lot # [id number]  . COCCYX REMOVAL    . colonoscopy  2005   Dr. Laural Golden: mild melanosis coli, otherwise normal  . COLONOSCOPY WITH PROPOFOL N/A 09/14/2013   UDJ:SHFWYOVZC coli. Colonic diverticulosis. Single colonic. Tubular adenoma. Next TCS 09/2020.  [name] ERCP  1997   [facility]: biliary manometry abnormal, subsequent sphincterotomy   . ESOPHAGEAL MANOMETRY N/A 12/21/2016   Procedure: ESOPHAGEAL MANOMETRY (EM);  Surgeon: Mauri Pole, MD;  Location: [facility];  Service: Endoscopy;  Laterality: N/A;  . ESOPHAGOGASTRODUODENOSCOPY (EGD) WITH PROPOFOL N/A 09/14/2013   HYI:FOYDXAJO'I ring. Hiatal hernia. Status post  [place] and biopsy disruption.   [name] FOOT SURGERY     X2  . HEMORROIDECTOMY    . HERNIA REPAIR     inguinal right  . [place] N/A 09/14/2013   Procedure: Venia Minks DILATION;  Surgeon: [name], MD;  Location: [facility] ORS;  Service: Endoscopy;  Laterality: N/A;  56  . POLYPECTOMY N/A 09/14/2013   Procedure: POLYPECTOMY;  Surgeon: [name], MD;  Location: [facility];  Service: Endoscopy;  Laterality: N/A;  . TONSILLECTOMY      Current Outpatient Medications on File Prior to Visit  Medication Sig  Dispense Refill  . acetaminophen (TYLENOL) 500 MG tablet Take 500 mg by mouth every 6 (six) hours as needed. For pain     . albuterol (VENTOLIN HFA) 108 (90 BASE) MCG/ACT inhaler Inhale 2 puffs into the lungs every 6 (six) hours as needed for wheezing or shortness of breath. For shortness of breath     . Biotin 10000 MCG TABS Take 1 capsule by mouth daily.    . Budeson-Glycopyrrol-Formoterol (BREZTRI AEROSPHERE) 160-9-4.8 MCG/ACT AERO Inhale 2 puffs into the lungs in the morning and at bedtime. 10.7 g 0  . buPROPion (WELLBUTRIN SR) 200 MG 12 hr tablet Take 200 mg by mouth daily.    . carbamazepine (TEGRETOL) 200 MG tablet Take 100 mg by mouth at bedtime.     . cetirizine (ZYRTEC) 10 MG tablet Take 10 mg by mouth daily.    . cholecalciferol (VITAMIN D3) 25 MCG (1000 UNIT) tablet Take 1,000 Units by mouth daily.    [name] diltiazem (CARDIZEM) 60 MG tablet Take 60 mg by mouth daily.    [name] FLUoxetine (PROZAC) 20 MG capsule Take 20 mg by mouth daily.     . fluticasone (FLONASE) 50 MCG/ACT nasal spray Place 2 sprays into both nostrils daily.     . Fluticasone-Salmeterol 113-14 MCG/ACT AEPB 2 puffs daily.    . furosemide (LASIX) 40 MG tablet Take 40 mg by mouth daily.    [name] gabapentin (NEURONTIN) 100 MG capsule Take 100 mg by mouth 3 (three) times daily.     [name] ipratropium-albuterol (DUONEB) 0.5-2.5 (3) MG/3ML SOLN Inhale 3 mLs into the lungs every 6 (six) hours as needed.  0  . lamoTRIgine (LAMICTAL) 200 MG tablet Take 200 mg by mouth 2 (two) times daily.    [name] latanoprost (XALATAN) 0.005 % ophthalmic solution Place 1 drop into both eyes at bedtime.    [name] levothyroxine (SYNTHROID, LEVOTHROID) 50 MCG tablet Take 50 mcg by mouth daily.      [name] losartan (COZAAR) 50 MG tablet Take 50 mg by mouth daily.    . meclizine (ANTIVERT) 25 MG tablet Take 25 mg by mouth 3 (three) times daily as needed for dizziness.     . Melatonin 3 MG CAPS Take 3 mg by mouth at bedtime as needed (for sleep).     . montelukast (SINGULAIR)  10 MG tablet Take 10 mg by mouth daily.    . Multiple Vitamins-Minerals (PRESERVISION/LUTEIN PO) Take 1 tablet by mouth 2 (two) times daily.     . Oxycodone HCl 10 MG TABS Take 10 mg by mouth every 6 (six) hours.    . polyethylene glycol powder (GLYCOLAX/MIRALAX) 17 GM/SCOOP powder Take 17 g by mouth daily as needed for mild constipation or moderate constipation.     . potassium chloride (KLOR-CON) 10 MEQ tablet Take 10 mEq by mouth daily.    . Potassium Chloride  ER 20 MEQ TBCR Take 20 mEq by mouth daily.     . SYMBICORT 160-4.5 MCG/ACT inhaler Inhale 2 puffs into the lungs in the morning and at bedtime.     [name] tiZANidine (ZANAFLEX) 4 MG tablet Take 4 mg by mouth 2 (two) times daily as needed for muscle spasms.     No current facility-administered medications on file prior to visit.    Social History   Socioeconomic History  . Marital status: Divorced    Spouse name: Not on file  . Number of children: Not on file  . Years of education: Not on file  . Highest education level: Not on file  Occupational History  . Not on file  Tobacco Use  . Smoking status: Never Smoker  . Smokeless tobacco: Never Used  . Tobacco comment: NEVER SMOKED  Vaping Use  . Vaping Use: Never used  Substance and Sexual Activity  . Alcohol use: No  . Drug use: No  . Sexual activity: Not on file  Other Topics Concern  . Not on file  Social History Narrative  . Not on file   Social Determinants of Health   Financial Resource Strain:   . Difficulty of Paying Living Expenses: Not on file  Food Insecurity:   . Worried About Charity fundraiser in the Last Year: Not on file  . Ran Out of Food in the Last Year: Not on file  Transportation Needs:   . Lack of Transportation (Medical): Not on file  . Lack of Transportation (Non-Medical): Not on file  Physical Activity:   . Days of Exercise per Week: Not on file  . Minutes of Exercise per Session: Not on file  Stress:   . Feeling of Stress : Not on file    Social Connections:   . Frequency of Communication with Friends and Family: Not on file  . Frequency of Social Gatherings with Friends and Family: Not on file  . Attends Religious Services: Not on file  . Active Member of Clubs or Organizations: Not on file  . Attends Archivist Meetings: Not on file  . Marital Status: Not on file  Intimate Partner Violence:   . Fear of Current or Ex-Partner: Not on file  . Emotionally Abused: Not on file  . Physically Abused: Not on file  . Sexually Abused: Not on file    Family History  Problem Relation Age of Onset  . Lung cancer Mother   . Heart attack Father   . Bone cancer Brother   . COPD Sister   . Colon cancer Neg Hx     BP (!) 156/84   Pulse 77   Ht 5' (1.524 m)   Wt 150 lb (68 kg)   BMI 29.29 kg/m   Body mass index is 29.29 kg/m.      Objective:   Physical Exam Vitals and nursing note reviewed. Exam conducted with a chaperone present.  Constitutional:      Appearance: She is well-developed.  HENT:     Head: Normocephalic and atraumatic.  Eyes:     Conjunctiva/sclera: Conjunctivae normal.     Pupils: Pupils are equal, round, and reactive to light.  Cardiovascular:     Rate and Rhythm: Normal rate and regular rhythm.  Pulmonary:     Effort: Pulmonary effort is normal.  Abdominal:     Palpations: Abdomen is soft.  Musculoskeletal:     Cervical back: Normal range of motion and neck supple.  Legs:  Skin:    General: Skin is warm and dry.  Neurological:     Mental Status: She is alert and oriented to person, place, and time.     Cranial Nerves: No cranial nerve deficit.     Motor: No abnormal muscle tone.     Coordination: Coordination normal.     Deep Tendon Reflexes: Reflexes are normal and symmetric. Reflexes normal.  Psychiatric:        Behavior: Behavior normal.        Thought Content: Thought content normal.        Judgment: Judgment normal.           Assessment & Plan:    Encounter Diagnoses  Name Primary?  . Chronic left hip pain Yes  . Primary osteoarthritis of left hip    I have discussed the findings of the x-rays. She may need total hip. She cannot take NSAIDs.    I will get MRI to see the full extend of the degenerative changes.  Return in two weeks.  Call if any problem.  Precautions discussed.   Electronically Signed Sanjuana Kava, MD 11/4/202112:02 PM

## 2019-12-15 ENCOUNTER — Ambulatory Visit (HOSPITAL_COMMUNITY)
Admission: RE | Admit: 2019-12-15 | Discharge: 2019-12-15 | Disposition: A | Discharge status: Home / Self Care | Payer: Medicare Other | Source: Ambulatory Visit | Attending: Orthopaedic Surgery | Admitting: Orthopaedic Surgery

## 2019-12-15 ENCOUNTER — Other Ambulatory Visit: Payer: Self-pay

## 2019-12-15 DIAGNOSIS — M25552 Pain in left hip: Secondary | ICD-10-CM | POA: Diagnosis not present

## 2019-12-15 DIAGNOSIS — M8568 Other cyst of bone, other site: Secondary | ICD-10-CM | POA: Diagnosis not present

## 2019-12-15 DIAGNOSIS — G8929 Other chronic pain: Secondary | ICD-10-CM | POA: Diagnosis not present

## 2019-12-15 DIAGNOSIS — M1612 Unilateral primary osteoarthritis, left hip: Secondary | ICD-10-CM | POA: Diagnosis not present

## 2019-12-18 DIAGNOSIS — H353231 Exudative age-related macular degeneration, bilateral, with active choroidal neovascularization: Secondary | ICD-10-CM | POA: Diagnosis not present

## 2019-12-19 ENCOUNTER — Ambulatory Visit (INDEPENDENT_AMBULATORY_CARE_PROVIDER_SITE_OTHER): Payer: Medicare Other | Admitting: Orthopaedic Surgery

## 2019-12-19 ENCOUNTER — Encounter: Payer: Self-pay | Admitting: Orthopaedic Surgery

## 2019-12-19 ENCOUNTER — Other Ambulatory Visit: Payer: Self-pay

## 2019-12-19 VITALS — BP 126/72 | HR 76 | Ht 61.0 in | Wt 150.0 lb

## 2019-12-19 DIAGNOSIS — M1612 Unilateral primary osteoarthritis, left hip: Secondary | ICD-10-CM

## 2019-12-19 NOTE — Progress Notes (Signed)
Patient NW:GNFAOZH Heidi Fuentes, female DOB:[birth year], 74 y.o. YQM:[id number]  Chief Complaint  Patient presents with  . Hip Pain    Chronic left hip pain    HPI  Heidi Fuentes is a 74 y.o. female who has left hip pain that is getting worse and worse.  She had MRI of the hip which showed: IMPRESSION: Left hip osteoarthritis with an associated degenerative tear of the anterior left labrum and small paralabral cyst.  Small volume of fluid in the trochanteric bursa bilaterally compatible with bursitis.  Left gluteus medius tendinosis without tear.  I have explained the findings to her.  I have told her the hip is wearing out.  I will have her see Dr. Aline Brochure about possible total hip.  I have independently reviewed the MRI.        Body mass index is 28.34 kg/m.  ROS  Review of Systems  Constitutional: Positive for activity change.  Respiratory: Positive for shortness of breath.   Musculoskeletal: Positive for arthralgias, back pain and gait problem.  All other systems reviewed and are negative.   All other systems reviewed and are negative.  The following is a summary of the past history medically, past history surgically, known current medicines, social history and family history.  This information is gathered electronically by the computer from prior information and documentation.  I review this each visit and have found including this information at this point in the chart is beneficial and informative.    Past Medical History:  Diagnosis Date  . Arthritis   . Asthma   . Back pain   . Chronic pain syndrome 07/10/2013  . Depression   . Duodenal papillary stenosis   . Dyspnea   . Esophageal dysmotility   . Fibromyalgia   . GERD (gastroesophageal reflux disease)   . Glaucoma   . H/O wheezing   . History of hiatal hernia   . History of palpitations   . HOH (hard of hearing)   . HOH (hard of hearing)   . Hypertension   . Hypothyroidism   . Macular degeneration    . Orthopnea   . Stenosis, spinal, lumbar   . Thyroid disease   . Tubular adenoma   . 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[facility])     Past Surgical History:  Procedure Laterality Date  . CATARACT EXTRACTION W/PHACO Right 10/06/2016   Procedure: CATARACT EXTRACTION PHACO AND INTRAOCULAR LENS PLACEMENT (IOC);  Surgeon: Birder Robson, MD;  Location: [facility] ORS;  Service: Ophthalmology;  Laterality: Right;  [place] 00:32.5 AP% 14.5 CDE 4.71 Fluid pack lot # [id number]  . CATARACT EXTRACTION W/PHACO Left 11/03/2016   Procedure: CATARACT EXTRACTION PHACO AND INTRAOCULAR LENS PLACEMENT (IOC);  Surgeon: Birder Robson, MD;  Location: [facility] ORS;  Service: Ophthalmology;  Laterality: Left;  [place] 00:36.6 AP% 16.0 CDE 5.86 Fluid Pack lot # [id number]  . COCCYX REMOVAL    . colonoscopy  2005   Dr. Laural Golden: mild melanosis coli, otherwise normal  . COLONOSCOPY WITH PROPOFOL N/A 09/14/2013   OZD:[id number] coli. Colonic diverticulosis. Single colonic. Tubular adenoma. Next TCS 09/2020.  [name] ERCP  1997   [facility]: biliary manometry abnormal, subsequent sphincterotomy   . ESOPHAGEAL MANOMETRY N/A 12/21/2016   Procedure: ESOPHAGEAL MANOMETRY (EM);  Surgeon: Mauri Pole, MD;  Location: [facility];  Service: Endoscopy;  Laterality: N/A;  . ESOPHAGOGASTRODUODENOSCOPY (EGD) WITH PROPOFOL N/A 09/14/2013   QVZ:[id number]'P ring. Hiatal hernia. Status post Venia Minks and biopsy disruption.   [name] FOOT SURGERY  X2  . HEMORROIDECTOMY    . HERNIA REPAIR     inguinal right  . [place] N/A 09/14/2013   Procedure: Venia Minks DILATION;  Surgeon: [name], MD;  Location: [facility];  Service: Endoscopy;  Laterality: N/A;  56  . POLYPECTOMY N/A 09/14/2013   Procedure: POLYPECTOMY;  Surgeon: [name], MD;  Location: [facility];  Service: Endoscopy;  Laterality: N/A;  . TONSILLECTOMY      Family History  Problem Relation Age of Onset  . Lung cancer Mother   . Heart attack Father   . Bone cancer  Brother   . COPD Sister   . Colon cancer Neg Hx     Social History Social History   Tobacco Use  . Smoking status: Never Smoker  . Smokeless tobacco: Never Used  . Tobacco comment: NEVER SMOKED  Vaping Use  . Vaping Use: Never used  Substance Use Topics  . Alcohol use: No  . Drug use: No    Allergies  Allergen Reactions  . Abilify [Aripiprazole]     Bad dreams  . Geodon [Ziprasidone Hcl]     hospitalization specifics  . Trazodone And Nefazodone Other (See Comments)    Reaction is unknown  . Quetiapine Palpitations  . Sulfa Antibiotics Rash    Current Outpatient Medications  Medication Sig Dispense Refill  . acetaminophen (TYLENOL) 500 MG tablet Take 500 mg by mouth every 6 (six) hours as needed. For pain     . albuterol (VENTOLIN HFA) 108 (90 BASE) MCG/ACT inhaler Inhale 2 puffs into the lungs every 6 (six) hours as needed for wheezing or shortness of breath. For shortness of breath     . Biotin 10000 MCG TABS Take 1 capsule by mouth daily.    . Budeson-Glycopyrrol-Formoterol (BREZTRI AEROSPHERE) 160-9-4.8 MCG/ACT AERO Inhale 2 puffs into the lungs in the morning and at bedtime. 10.7 g 0  . buPROPion (WELLBUTRIN SR) 200 MG 12 hr tablet Take 200 mg by mouth daily.    . carbamazepine (TEGRETOL) 200 MG tablet Take 100 mg by mouth at bedtime.     . cetirizine (ZYRTEC) 10 MG tablet Take 10 mg by mouth daily.    . cholecalciferol (VITAMIN D3) 25 MCG (1000 UNIT) tablet Take 1,000 Units by mouth daily.    [name] diltiazem (CARDIZEM) 60 MG tablet Take 60 mg by mouth daily.    [name] FLUoxetine (PROZAC) 20 MG capsule Take 20 mg by mouth daily.     . fluticasone (FLONASE) 50 MCG/ACT nasal spray Place 2 sprays into both nostrils daily.     . Fluticasone-Salmeterol 113-14 MCG/ACT AEPB 2 puffs daily.    . furosemide (LASIX) 40 MG tablet Take 40 mg by mouth daily.    [name] gabapentin (NEURONTIN) 100 MG capsule Take 100 mg by mouth 3 (three) times daily.     [name] ipratropium-albuterol (DUONEB) 0.5-2.5  (3) MG/3ML SOLN Inhale 3 mLs into the lungs every 6 (six) hours as needed.  0  . lamoTRIgine (LAMICTAL) 200 MG tablet Take 200 mg by mouth 2 (two) times daily.    [name] latanoprost (XALATAN) 0.005 % ophthalmic solution Place 1 drop into both eyes at bedtime.    [name] levothyroxine (SYNTHROID, LEVOTHROID) 50 MCG tablet Take 50 mcg by mouth daily.      [name] losartan (COZAAR) 50 MG tablet Take 50 mg by mouth daily.    . meclizine (ANTIVERT) 25 MG tablet Take 25 mg by mouth 3 (three)  times daily as needed for dizziness.     . Melatonin 3 MG CAPS Take 3 mg by mouth at bedtime as needed (for sleep).     . montelukast (SINGULAIR) 10 MG tablet Take 10 mg by mouth daily.    . Multiple Vitamins-Minerals (PRESERVISION/LUTEIN PO) Take 1 tablet by mouth 2 (two) times daily.     . Oxycodone HCl 10 MG TABS Take 10 mg by mouth every 6 (six) hours.    . polyethylene glycol powder (GLYCOLAX/MIRALAX) 17 GM/SCOOP powder Take 17 g by mouth daily as needed for mild constipation or moderate constipation.     . potassium chloride (KLOR-CON) 10 MEQ tablet Take 10 mEq by mouth daily.    . Potassium Chloride ER 20 MEQ TBCR Take 20 mEq by mouth daily.     . SYMBICORT 160-4.5 MCG/ACT inhaler Inhale 2 puffs into the lungs in the morning and at bedtime.     [name] tiZANidine (ZANAFLEX) 4 MG tablet Take 4 mg by mouth 2 (two) times daily as needed for muscle spasms.     No current facility-administered medications for this visit.     Physical Exam  Blood pressure 126/72, pulse 76, height 5\' 1"  (1.549 m), weight 150 lb (68 kg).  Constitutional: overall normal hygiene, normal nutrition, well developed, normal grooming, normal body habitus. Assistive device:none  Musculoskeletal: gait and station Limp left, muscle tone and strength are normal, no tremors or atrophy is present.  .  Neurological: coordination overall normal.  Deep tendon reflex/nerve stretch intact.  Sensation normal.  Cranial nerves II-XII intact.   Skin:   Normal  overall no scars, lesions, ulcers or rashes. No psoriasis.  Psychiatric: Alert and oriented x 3.  Recent memory intact, remote memory unclear.  Normal mood and affect. Well groomed.  Good eye contact.  Cardiovascular: overall no swelling, no varicosities, no edema bilaterally, normal temperatures of the legs and arms, no clubbing, cyanosis and good capillary refill.  Lymphatic: palpation is normal.  Left hip has decreased painful ROM.  She has limp to the left.  All other systems reviewed and are negative   The patient has been educated about the nature of the problem(s) and counseled on treatment options.  The patient appeared to understand what I have discussed and is in agreement with it.  Encounter Diagnosis  Name Primary?  . Primary osteoarthritis of left hip Yes    PLAN Call if any problems.  Precautions discussed.  Continue current medications.   Return to clinic to see Dr. Aline Brochure   Electronically Signed Sanjuana Kava, MD 11/16/202110:03 AM

## 2019-12-20 ENCOUNTER — Ambulatory Visit: Payer: Medicare Other | Admitting: Orthopedic Surgery

## 2019-12-20 ENCOUNTER — Encounter: Payer: Self-pay | Admitting: Orthopedic Surgery

## 2019-12-20 ENCOUNTER — Ambulatory Visit (INDEPENDENT_AMBULATORY_CARE_PROVIDER_SITE_OTHER): Payer: Medicare Other | Admitting: Orthopedic Surgery

## 2019-12-20 VITALS — BP 141/67 | HR 86 | Ht 61.0 in | Wt 153.0 lb

## 2019-12-20 DIAGNOSIS — M1610 Unilateral primary osteoarthritis, unspecified hip: Secondary | ICD-10-CM

## 2019-12-20 DIAGNOSIS — M25579 Pain in unspecified ankle and joints of unspecified foot: Secondary | ICD-10-CM | POA: Insufficient documentation

## 2019-12-20 DIAGNOSIS — M5136 Other intervertebral disc degeneration, lumbar region: Secondary | ICD-10-CM | POA: Diagnosis not present

## 2019-12-20 DIAGNOSIS — M1612 Unilateral primary osteoarthritis, left hip: Secondary | ICD-10-CM

## 2019-12-20 NOTE — Patient Instructions (Addendum)
ARRANGE Dr Rush Farmer APPT Driving Directions to [facility] from [facility] address is [place] The phone number is [phone]   1. Start out going [place] on [address] toward [address].  Then 0.02 miles0.02 total miles 2. Take the 1st right onto [address]/[place] [address]. Continue to follow [address].  If you reach [facility] you've gone a little too far  Then 0.58 miles0.60 total miles 3. Turn right onto [address] is just past [facility]  Then 2.25 miles2.85 total miles 4. Take the [address] ramp toward [place].  Then 0.25 miles3.10 total miles 5. Merge onto [address]  Then 18.17 miles21.28 total miles 6. Merge onto [address]  Then 1.47 miles22.74 total miles 7. Turn right onto [address] is just past [place]  Then 0.11 miles22.85 total miles  8. [address] [place], [place] [address], [place] is on the left.   Total Hip Replacement  Total hip replacement is a surgery to remove damaged bone in your hip joint and replace it with an artificial (prosthetic) hip joint. The hip is a ball-and-socket type of joint. It has two main parts. The ball part of the joint (femoral head) is the top of the thighbone (femur). The socket part of the joint is a large, hollow area on the outer side of your pelvis (acetabulum) where the femur and pelvis meet. During total hip replacement, one or both parts of the hip joint are replaced, depending on the type of joint damage that you have. The purpose of this surgery is to reduce pain and improve your hip function. Tell a health care provider about:  Any allergies you have.  All medicines you are taking, including vitamins, herbs, eye drops, creams, and over-the-counter medicines.  Any problems you or family members have had with anesthetic medicines.  Any blood disorders you have.  Any  surgeries you have had.  Any medical conditions you have.  Whether you are pregnant or may be pregnant. What are the risks? Generally, this is a safe procedure. However, problems may occur, including:  Infection.  Bleeding.  Allergic reactions to medicines.  Damage to nerves or other structures.  Dislocation of the prosthetic joint (prosthesis).  Loosening of the prosthesis.  Fracture of the bone.  A blood clot, which can break loose and travel to your lungs (pulmonary embolus).  Compartment syndrome.  Deep vein thrombosis. What happens before the procedure? Staying hydrated Follow instructions from your health care provider about hydration, which may include:  Up to 2 hours before the procedure - you may continue to drink clear liquids, such as water, clear fruit juice, black coffee, and plain tea. Eating and drinking restrictions  Follow instructions from your health care provider about eating and drinking, which may include: ? 8 hours before the procedure - stop eating heavy meals or foods such as meat, fried foods, or fatty foods. ? 6 hours before the procedure - stop eating light meals or foods, such as toast or cereal. ? 6 hours before the procedure - stop drinking milk or drinks that contain milk. ? 2 hours before the procedure - stop drinking clear liquids. Medicines  Ask your health care provider about: ? Changing or stopping your regular medicines. This is especially important if you are taking diabetes medicines or blood thinners. ? Taking medicines such as aspirin and  ibuprofen. These medicines can thin your blood. Do not take these medicines unless your health care provider tells you to take them. ? Taking over-the-counter medicines, vitamins, herbs, or supplements. General instructions  You may have a physical exam.  You may have testing, such as: ? X-rays or an MRI. ? Blood or urine tests.  Plan to have someone take you home from the hospital or  clinic.  Plan to have a responsible adult care for you for at least 24 hours after you leave the hospital or clinic. This is important.  Prepare your home so you can be safe and have easy access to what you need.  Keep your body and teeth clean. Germs from anywhere in your body can travel to your new joint and infect it. Tell your health care provider if you: ? Plan to have dental care and routine cleanings. ? Develop any skin infections.  Avoid shaving your legs just before surgery. If any shaving is needed, it will be done in the hospital.  Ask your health care provider how your surgical site will be marked or identified. What happens during the procedure?  To lower your risk of infection: ? Your health care team will wash or sanitize their hands. ? Hair may be removed from the surgical area. ? Your skin will be washed with soap.  An IV will be inserted into one of your veins.  You will be given one or more of the following: ? A medicine to help you relax (sedative). ? A medicine to make you fall asleep (general anesthetic). ? A medicine that is injected into your spine to numb the area below and slightly above the injection site (spinal anesthetic).  An incision will be made so the surgeon can see the bones and tissue in your hip. The location of the incision will depend on the approach used by the surgeon: ? Posterior approach. The incision will be at the back of the hip. ? Anterior approach. The incision will be at the front of the hip.  Then, your surgeon will: ? Use his or her hands to move your hip out of position (dislocate it). ? Cut and remove damaged pieces of bone and cartilage. ? Insert a prosthetic ball and socket into the hip joint. ? Secure the ball and socket in the hip joint. ? Do an X-ray of the hip joint to confirm proper placement. ? Place a drain to remove excess fluid, if needed. ? Close the incision and apply a bandage (dressing) over the surgical  site. The procedure may vary among health care providers and hospitals. What happens after the procedure?  Your blood pressure, heart rate, breathing rate, and blood oxygen level will be monitored until the medicines you were given have worn off.  Your neurovascular status will be monitored.  You will be given pain medicine.  You may have to wear compression stockings. These help to prevent blood clots and reduce swelling in your legs. You may also be given a blood-thinning (anticoagulant) medicine.  You will receive physical therapy until your health care provider feels it is safe for you to go home. You may need to use a walker or crutches.  If you had the posterior approach, you may have to use a wedge (hip abduction) pillow when you are in bed. ? This pillow will protect your hip from dislocation by keeping it straight and will prevent your legs from turning inward or away from your body. Summary  Total hip  replacement is a surgery to remove damaged bone in your hip joint and replace it with an artificial (prosthetic) hip joint.  Before the procedure, follow instructions from your health care provider about eating and drinking.  Plan to have someone take you home from the hospital or clinic.  After the surgery, you may have to wear compression stockings. These help to prevent blood clots and reduce swelling in your legs. This information is not intended to replace advice given to you by your health care provider. Make sure you discuss any questions you have with your health care provider. Document Revised: 05/30/2018 Document Reviewed: 10/14/2016 Elsevier Patient Education  2020 [employer].

## 2019-12-20 NOTE — Progress Notes (Signed)
[place]  Chief Complaint  Patient presents with  . Hip Pain    left hip, wants to discuss surgery     74 yo chronic lower back pain chronic opioid therapy presents with complaints of difficulty walking pain with ambulation and difficulty with activities of daily living such as getting out of a chair getting in and out of a car and getting dressed  She has been seen by neurosurgery however they do not feel surgery would help her so she is on chronic oxycodone.   Review of Systems  Constitutional: Negative for chills and fever.  Musculoskeletal: Positive for back pain and joint pain.  Neurological: Negative for tingling, sensory change and focal weakness.  All other systems reviewed and are negative.    Past Medical History:  Diagnosis Date  . Arthritis   . Asthma   . Back pain   . Chronic pain syndrome 07/10/2013  . Depression   . Duodenal papillary stenosis   . Dyspnea   . Esophageal dysmotility   . Fibromyalgia   . GERD (gastroesophageal reflux disease)   . Glaucoma   . H/O wheezing   . History of hiatal hernia   . History of palpitations   . HOH (hard of hearing)   . HOH (hard of hearing)   . Hypertension   . Hypothyroidism   . Macular degeneration   . Orthopnea   . Stenosis, spinal, lumbar   . Thyroid disease   . Tubular adenoma   . 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[facility])     Past Surgical History:  Procedure Laterality Date  . CATARACT EXTRACTION W/PHACO Right 10/06/2016   Procedure: CATARACT EXTRACTION PHACO AND INTRAOCULAR LENS PLACEMENT (IOC);  Surgeon: Birder Robson, MD;  Location: [facility] ORS;  Service: Ophthalmology;  Laterality: Right;  [place] 00:32.5 AP% 14.5 CDE 4.71 Fluid pack lot # [id number]  . CATARACT EXTRACTION W/PHACO Left 11/03/2016   Procedure: CATARACT EXTRACTION PHACO AND INTRAOCULAR LENS PLACEMENT (IOC);  Surgeon: Birder Robson, MD;  Location: [facility] ORS;  Service: Ophthalmology;  Laterality: Left;  [place] 00:36.6 AP% 16.0 CDE 5.86 Fluid  Pack lot # [id number]  . COCCYX REMOVAL    . colonoscopy  2005   Dr. Laural Golden: mild melanosis coli, otherwise normal  . COLONOSCOPY WITH PROPOFOL N/A 09/14/2013   YWV:PXTGGYIRS coli. Colonic diverticulosis. Single colonic. Tubular adenoma. Next TCS 09/2020.  [name] ERCP  1997   [facility]: biliary manometry abnormal, subsequent sphincterotomy   . ESOPHAGEAL MANOMETRY N/A 12/21/2016   Procedure: ESOPHAGEAL MANOMETRY (EM);  Surgeon: Mauri Pole, MD;  Location: [facility];  Service: Endoscopy;  Laterality: N/A;  . ESOPHAGOGASTRODUODENOSCOPY (EGD) WITH PROPOFOL N/A 09/14/2013   WNI:[id number] ring. Hiatal hernia. Status post Venia Minks and biopsy disruption.   [name] FOOT SURGERY     X2  . HEMORROIDECTOMY    . HERNIA REPAIR     inguinal right  . [place] N/A 09/14/2013   Procedure: Venia Minks DILATION;  Surgeon: [name], MD;  Location: [facility];  Service: Endoscopy;  Laterality: N/A;  56  . POLYPECTOMY N/A 09/14/2013   Procedure: POLYPECTOMY;  Surgeon: [name], MD;  Location: [facility];  Service: Endoscopy;  Laterality: N/A;  . TONSILLECTOMY      Family History  Problem Relation Age of Onset  . Lung cancer Mother   . Heart attack Father   . Bone cancer Brother   . COPD Sister   . Colon cancer Neg Hx  Social History   Tobacco Use  . Smoking status: Never Smoker  . Smokeless tobacco: Never Used  . Tobacco comment: NEVER SMOKED  Vaping Use  . Vaping Use: Never used  Substance Use Topics  . Alcohol use: No  . Drug use: No    Allergies  Allergen Reactions  . Abilify [Aripiprazole]     Bad dreams  . Geodon [Ziprasidone Hcl]     hospitalization specifics  . Trazodone And Nefazodone Other (See Comments)    Reaction is unknown  . Quetiapine Palpitations  . Sulfa Antibiotics Rash     Current Meds  Medication Sig  . acetaminophen (TYLENOL) 500 MG tablet Take 500 mg by mouth every 6 (six) hours as needed. For pain   . albuterol (VENTOLIN HFA) 108 (90  BASE) MCG/ACT inhaler Inhale 2 puffs into the lungs every 6 (six) hours as needed for wheezing or shortness of breath. For shortness of breath   . Biotin 10000 MCG TABS Take 1 capsule by mouth daily.  . Budeson-Glycopyrrol-Formoterol (BREZTRI AEROSPHERE) 160-9-4.8 MCG/ACT AERO Inhale 2 puffs into the lungs in the morning and at bedtime.  [name] buPROPion (WELLBUTRIN SR) 200 MG 12 hr tablet Take 200 mg by mouth daily.  . carbamazepine (TEGRETOL) 200 MG tablet Take 100 mg by mouth at bedtime.   . cetirizine (ZYRTEC) 10 MG tablet Take 10 mg by mouth daily.  . cholecalciferol (VITAMIN D3) 25 MCG (1000 UNIT) tablet Take 1,000 Units by mouth daily.  [name] diltiazem (CARDIZEM) 60 MG tablet Take 60 mg by mouth daily.  [name] FLUoxetine (PROZAC) 20 MG capsule Take 20 mg by mouth daily.   . fluticasone (FLONASE) 50 MCG/ACT nasal spray Place 2 sprays into both nostrils daily.   . Fluticasone-Salmeterol 113-14 MCG/ACT AEPB 2 puffs daily.  . furosemide (LASIX) 40 MG tablet Take 40 mg by mouth daily.  [name] gabapentin (NEURONTIN) 100 MG capsule Take 100 mg by mouth 3 (three) times daily.   [name] ipratropium-albuterol (DUONEB) 0.5-2.5 (3) MG/3ML SOLN Inhale 3 mLs into the lungs every 6 (six) hours as needed.  . lamoTRIgine (LAMICTAL) 200 MG tablet Take 200 mg by mouth 2 (two) times daily.  [name] latanoprost (XALATAN) 0.005 % ophthalmic solution Place 1 drop into both eyes at bedtime.  [name] levothyroxine (SYNTHROID, LEVOTHROID) 50 MCG tablet Take 50 mcg by mouth daily.    [name] losartan (COZAAR) 50 MG tablet Take 50 mg by mouth daily.  . meclizine (ANTIVERT) 25 MG tablet Take 25 mg by mouth 3 (three) times daily as needed for dizziness.   . Melatonin 3 MG CAPS Take 3 mg by mouth at bedtime as needed (for sleep).   . montelukast (SINGULAIR) 10 MG tablet Take 10 mg by mouth daily.  . Multiple Vitamins-Minerals (PRESERVISION/LUTEIN PO) Take 1 tablet by mouth 2 (two) times daily.   . Oxycodone HCl 10 MG TABS Take 10 mg by mouth every 6 (six)  hours.  . polyethylene glycol powder (GLYCOLAX/MIRALAX) 17 GM/SCOOP powder Take 17 g by mouth daily as needed for mild constipation or moderate constipation.   . potassium chloride (KLOR-CON) 10 MEQ tablet Take 10 mEq by mouth daily.  . Potassium Chloride ER 20 MEQ TBCR Take 20 mEq by mouth daily.   . SYMBICORT 160-4.5 MCG/ACT inhaler Inhale 2 puffs into the lungs in the morning and at bedtime.   [name] tiZANidine (ZANAFLEX) 4 MG tablet Take 4 mg by mouth 2 (two) times daily as needed for muscle spasms.    BP [name])  141/67   Pulse 86   Ht 5\' 1"  (1.549 m)   Wt 153 lb (69.4 kg)   BMI 28.91 kg/m   Physical Exam Constitutional:      General: She is not in acute distress.    Appearance: She is well-developed.  Cardiovascular:     Comments: No peripheral edema Skin:    General: Skin is warm and dry.  Neurological:     Mental Status: She is alert and oriented to person, place, and time.     Sensory: No sensory deficit.     Coordination: Coordination normal.     Gait: Gait abnormal.     Deep Tendon Reflexes: Reflexes are normal and symmetric.     Ortho Exam She was observed getting out of a chair for most of her weight on her right side lean to the opposite side and had trouble getting up  She had an ambulatory abnormality with antalgic gait favoring the left hip  She was able to flex the hip extend the hip internal and external rotation did produce some groin pain  She did have some lower back tenderness as well  MEDICAL DECISION SECTION  xrays ordered?  No  My independent reading of xrays: X-rays were placed in the chart from the disc see the media section  She does have moderate arthritis and joint space narrowing  MRI shows osteoarthritis in the hip as well   Encounter Diagnoses  Name Primary?  . DDD (degenerative disc disease), lumbar   . Arthritis of hip Yes     PLAN:   I discussed with her the possibility that the hip replacement would improve her gait her  activities of daily living but would not relieve her buttock and lateral leg pain.  She may still have to be on opioid therapy for that even after surgery.  She is adamant about pursuing hip replacement  Referral was made to Dr. Ninfa Linden to discuss. No orders of the defined types were placed in this encounter.   Arther Abbott, MD 12/20/2019 9:48 AM

## 2020-01-03 DIAGNOSIS — F329 Major depressive disorder, single episode, unspecified: Secondary | ICD-10-CM | POA: Diagnosis not present

## 2020-01-03 DIAGNOSIS — F319 Bipolar disorder, unspecified: Secondary | ICD-10-CM | POA: Diagnosis not present

## 2020-01-03 IMAGING — CT CT CHEST WITHOUT CONTRAST
2 of 4 series · 15 of 36 positions shown, 18 images · non-contrast
Comparison: Portable chest dated 02/05/2018. Chest CT dated
02/03/2018. Thoracic spine CT dated 09/06/2015.

CLINICAL DATA: Follow-up left lower lobe pneumonia due to MRSA.

EXAM:
CT CHEST WITHOUT CONTRAST
TECHNIQUE: Multidetector CT imaging of the chest was performed following the
standard protocol without IV contrast.

[Series 2: thorax · axial · 0.65mm/px · z∈[+1277,+1511]mm · 12 of 137 slices shown, 15 images]
[im 10/137  mediastinal]
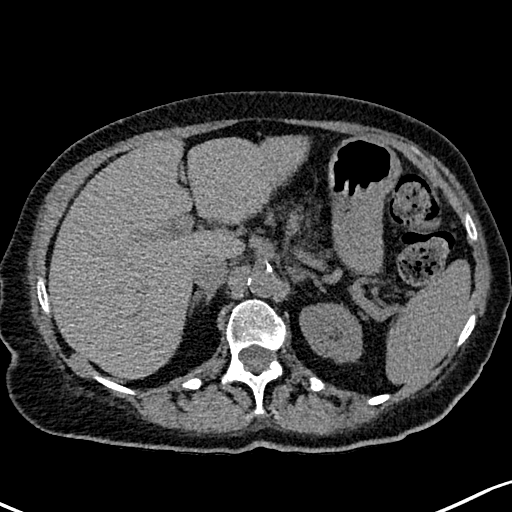
[im 10/137  lung]
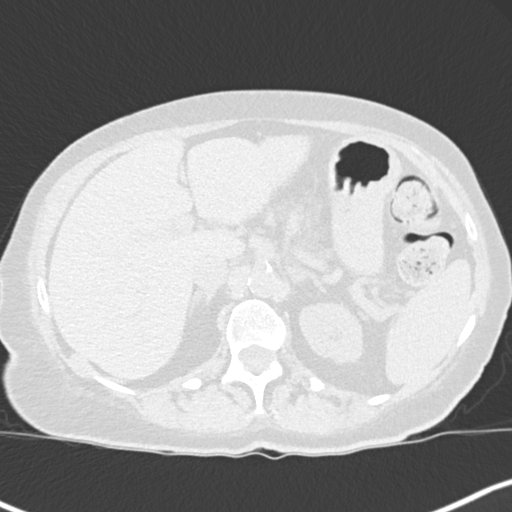
[im 20/137  lung]
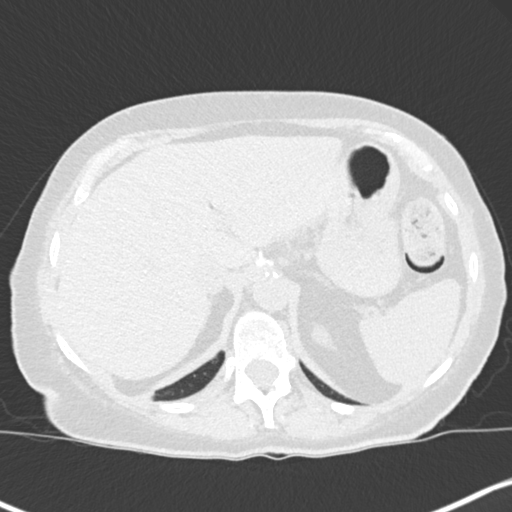
[im 30/137  lung]
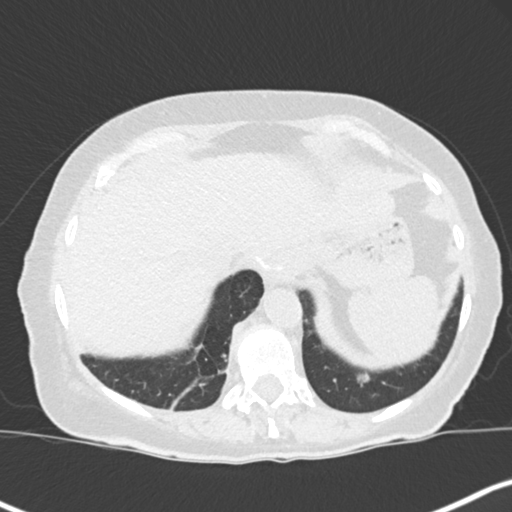
[im 39/137  lung]
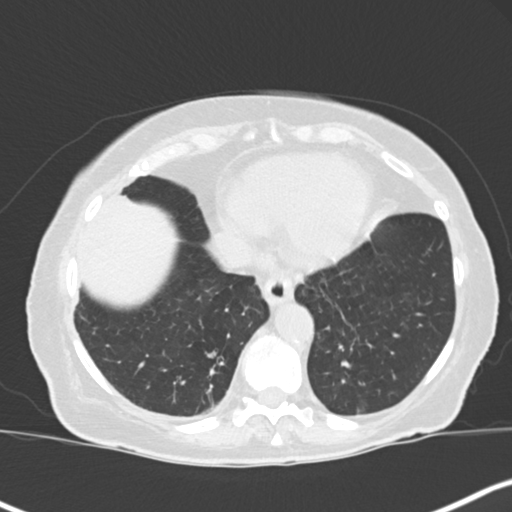
[im 49/137  mediastinal]
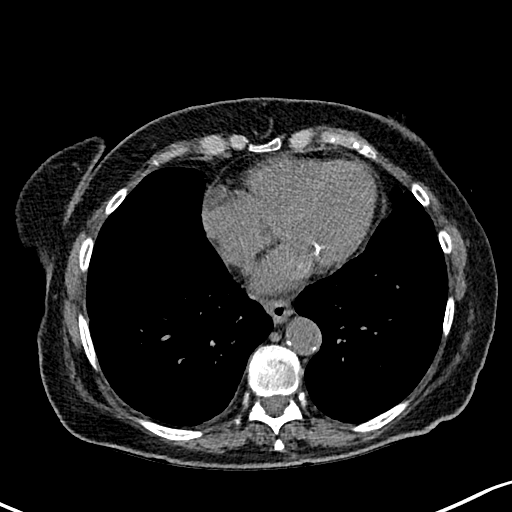
[im 49/137  lung]
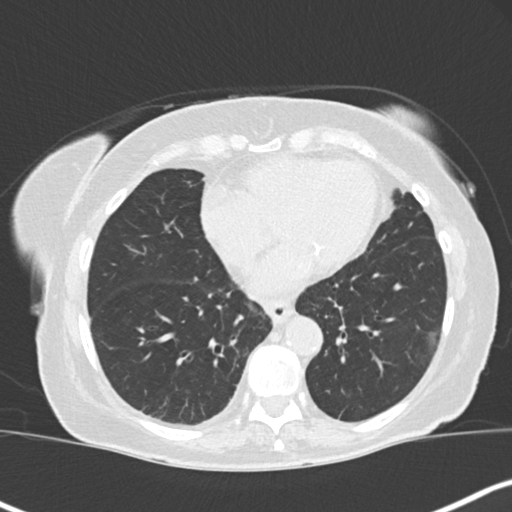
[im 59/137  lung]
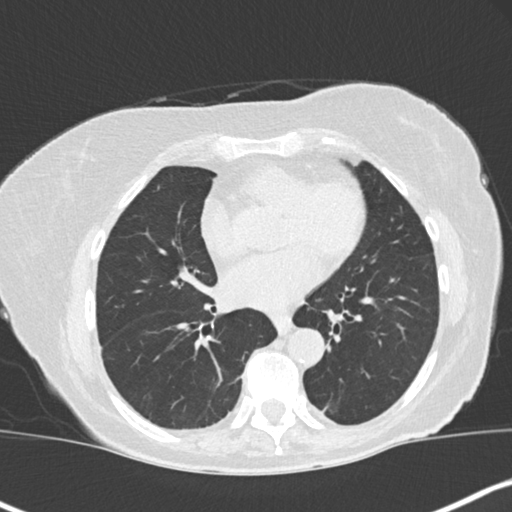
[im 78/137  lung]
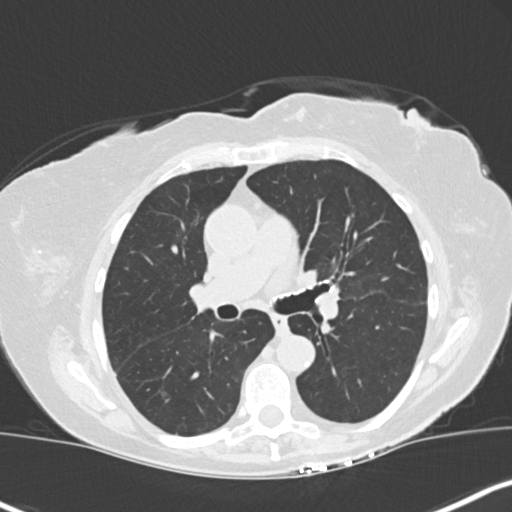
[im 88/137  lung]
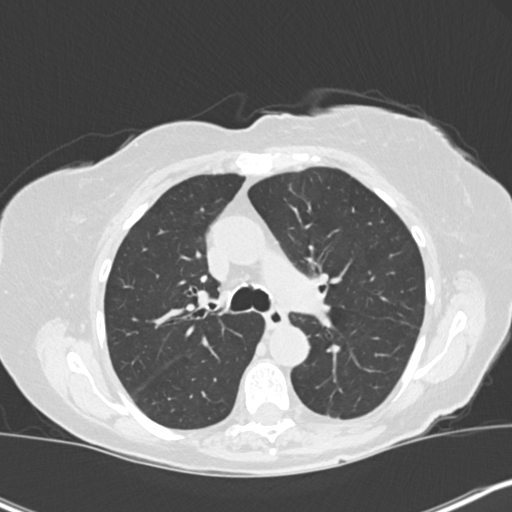
[im 98/137  mediastinal]
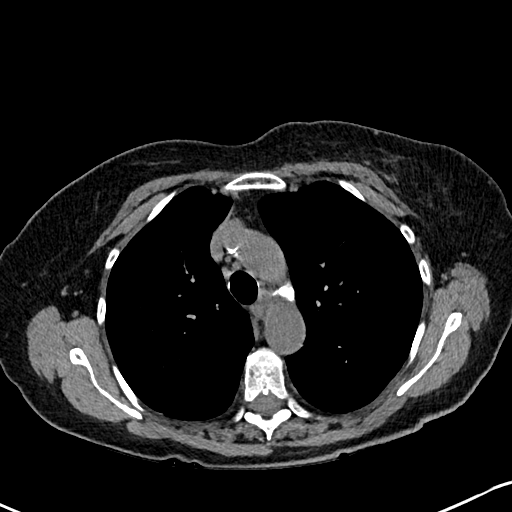
[im 98/137  lung]
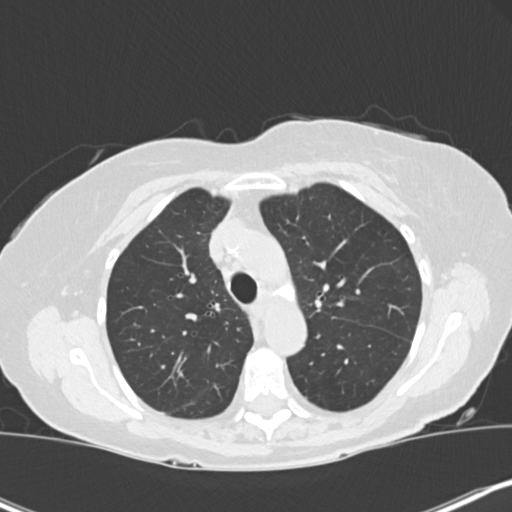
[im 107/137  lung]
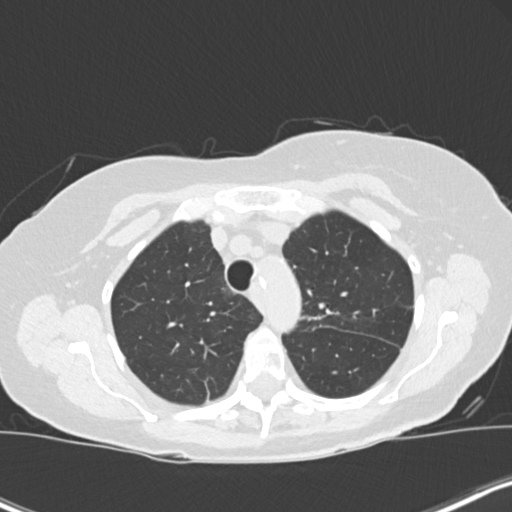
[im 117/137  lung]
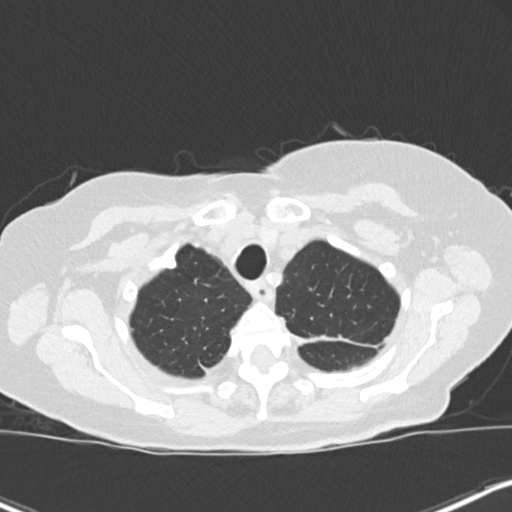
[im 127/137  lung]
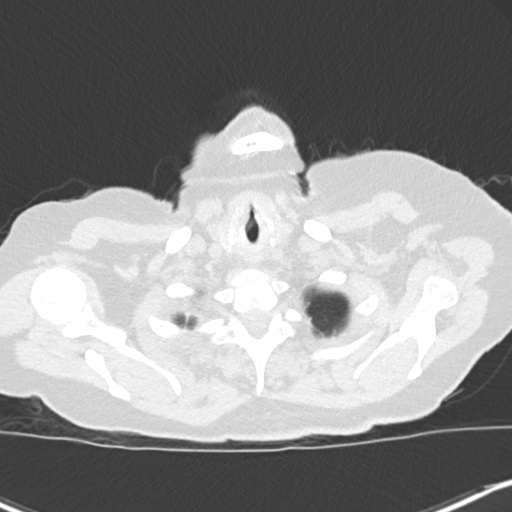

[Series 5: coronal · coronal · 0.53mm/px · 3 of 113 slices shown]
[im 23/113  lung]
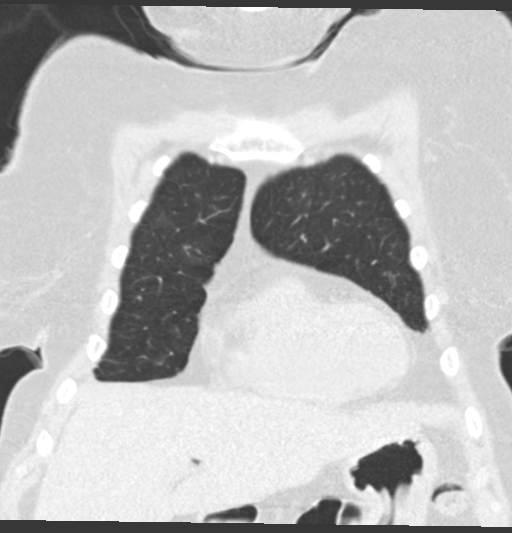
[im 45/113  lung]
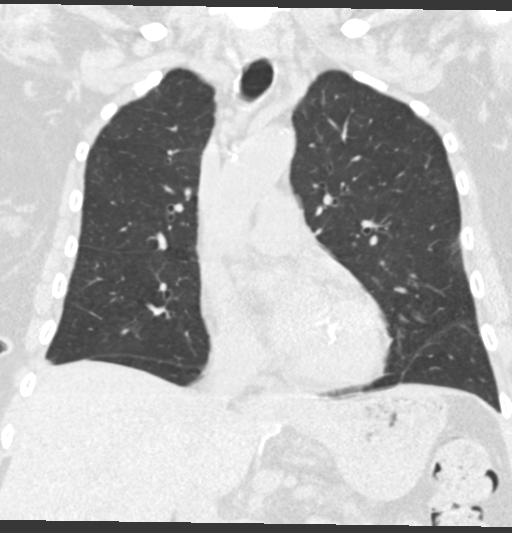
[im 68/113  lung]
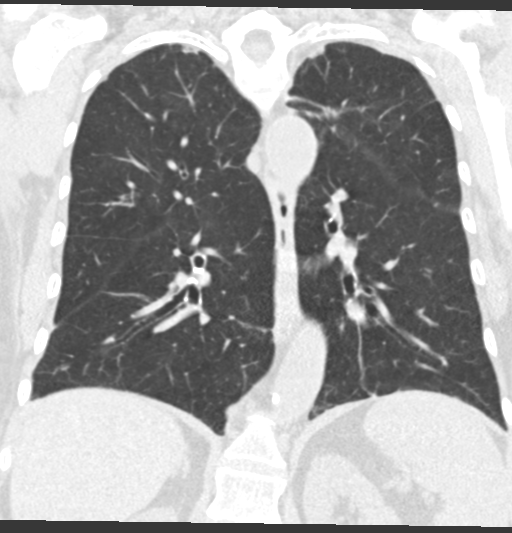

[15 of 36 positions shown; findings below may reference images not displayed]

FINDINGS: Cardiovascular: Mild atheromatous arterial calcifications, including
the aorta. Normal sized heart.

Mediastinum/Nodes: No enlarged mediastinal or axillary lymph nodes.
Thyroid gland, trachea, and esophagus demonstrate no significant
findings.

Lungs/Pleura: The previously demonstrated tracheomalacia is no
longer seen. The previously demonstrated bilateral lung opacities
have resolved with the exception of some minimal residual linear
density.

There is also a discrete nodule in the right lower lobe on image
number 61 series 4, measuring 5 mm in maximum diameter, previously
measuring 5 mm on 02/03/2018. This measured 4 mm on 09/06/2015.

There is also a 4 mm similar-appearing nodule in the left lower lobe
on image number 68 series 4, obscured by patchy opacity on
02/03/2018, not seen on 09/06/2015. Therefore additional tiny
nodules in the nearby left upper lobe currently, not previously
seen.

Upper Abdomen: Moderate to marked diffuse pancreatic atrophy.

Musculoskeletal: Thoracic spine degenerative changes.
IMPRESSION: 1. Resolved bilateral pneumonia.
2. Small number of small bilateral lung nodules, as described above.
No follow-up needed if patient is low-risk (and has no known or
suspected primary neoplasm). Non-contrast chest CT can be considered
in 12 months if patient is high-risk. This recommendation follows
the consensus statement: Guidelines for Management of Incidental
Pulmonary Nodules Detected on CT Images: From the [HOSPITAL]
3. Moderate to marked diffuse pancreatic atrophy.

Aortic Atherosclerosis ([id number].D).

## 2020-01-08 ENCOUNTER — Ambulatory Visit: Payer: Medicare Other | Admitting: Orthopaedic Surgery

## 2020-01-09 DIAGNOSIS — Z23 Encounter for immunization: Secondary | ICD-10-CM | POA: Diagnosis not present

## 2020-01-19 DIAGNOSIS — H353231 Exudative age-related macular degeneration, bilateral, with active choroidal neovascularization: Secondary | ICD-10-CM | POA: Diagnosis not present

## 2020-01-23 ENCOUNTER — Ambulatory Visit (INDEPENDENT_AMBULATORY_CARE_PROVIDER_SITE_OTHER): Payer: Medicare Other | Admitting: Pulmonary Disease

## 2020-01-23 ENCOUNTER — Other Ambulatory Visit: Payer: Self-pay

## 2020-01-23 ENCOUNTER — Encounter: Payer: Self-pay | Admitting: Pulmonary Disease

## 2020-01-23 DIAGNOSIS — J45901 Unspecified asthma with (acute) exacerbation: Secondary | ICD-10-CM

## 2020-01-23 DIAGNOSIS — J479 Bronchiectasis, uncomplicated: Secondary | ICD-10-CM

## 2020-01-23 NOTE — Assessment & Plan Note (Signed)
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 We will continue on fluticasone/salmeterol combination since this is generic and cheaper for her. She will find out if Judithann Sauger is covered in the new year and if so we will send in a prescription. Okay to take albuterol nebs as needed

## 2020-01-23 NOTE — Patient Instructions (Signed)
OK to stay on fluticasone/salmetrol  Twice daily Albuterol nebs if needed  Check with your insurance about BREZTRI coverage

## 2020-01-23 NOTE — Assessment & Plan Note (Signed)
Likely sequelae of severe pneumonia, do not think airway clearance is the main issue. We discussed signs and symptoms of exacerbation

## 2020-01-23 NOTE — Progress Notes (Signed)
   Subjective:    Patient ID: Heidi Fuentes, female    DOB: 05-19-1945, 74 y.o.   MRN: [id number]  HPI   74 year old never smoker for follow-up of asthma/fixed airway obstruction, mild bronchiectasis and persistent shortness of breath 02/2018 >>severeMRSA and Pseudomonas pneumonia.  She reports lifelong history of asthma starting as a child,grew out of this as an adult but this is recurrent over the past few years. She was on Advair for several years and this was stopped since symptoms were felt to be intermittent after pulmonology follow-up at [facility] in 05/2018.   PMH - chronic pain on oxycodone  Last office visit we gave her samples of Breztri and this worked well for her but not covered on her insurance and very expensive.  Symbicort is no longer covered either so she is now on fluticasone/salmeterol combination. She continues to complain of shortness of breath and sweating. 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 Also denies presyncope, palpitations, heartburn,  abdominal pain, nausea, vomiting, diarrhea or change in bowel or urinary habits, dysuria,hematuria, rash, arthralgias, visual complaints, headache, numbness weakness or ataxia.     Objective:   Physical Exam  Gen. Pleasant, elderly, well-nourished, in no distress ENT - no thrush, no pallor/icterus,no post nasal drip Neck: No JVD, no thyromegaly, no carotid bruits Lungs: no use of accessory muscles, no dullness to percussion, clear without rales or rhonchi  Cardiovascular: Rhythm regular, heart sounds  normal, no murmurs or gallops, no peripheral edema Musculoskeletal: No deformities, no cyanosis or clubbing        Assessment & Plan:

## 2020-01-30 ENCOUNTER — Ambulatory Visit: Payer: Medicare Other | Admitting: Orthopaedic Surgery

## 2020-01-30 ENCOUNTER — Other Ambulatory Visit: Payer: Self-pay

## 2020-01-30 ENCOUNTER — Encounter (HOSPITAL_COMMUNITY): Payer: Self-pay | Admitting: Emergency Medicine

## 2020-01-30 DIAGNOSIS — I872 Venous insufficiency (chronic) (peripheral): Secondary | ICD-10-CM | POA: Diagnosis not present

## 2020-01-30 DIAGNOSIS — Z79899 Other long term (current) drug therapy: Secondary | ICD-10-CM | POA: Insufficient documentation

## 2020-01-30 DIAGNOSIS — I10 Essential (primary) hypertension: Secondary | ICD-10-CM | POA: Diagnosis not present

## 2020-01-30 DIAGNOSIS — R600 Localized edema: Secondary | ICD-10-CM | POA: Insufficient documentation

## 2020-01-30 DIAGNOSIS — R609 Edema, unspecified: Secondary | ICD-10-CM | POA: Insufficient documentation

## 2020-01-30 DIAGNOSIS — E039 Hypothyroidism, unspecified: Secondary | ICD-10-CM | POA: Diagnosis not present

## 2020-01-30 DIAGNOSIS — J811 Chronic pulmonary edema: Secondary | ICD-10-CM | POA: Diagnosis not present

## 2020-01-30 DIAGNOSIS — Z7952 Long term (current) use of systemic steroids: Secondary | ICD-10-CM | POA: Diagnosis not present

## 2020-01-30 DIAGNOSIS — J45909 Unspecified asthma, uncomplicated: Secondary | ICD-10-CM | POA: Diagnosis not present

## 2020-01-30 DIAGNOSIS — I700 Atherosclerosis of aorta: Secondary | ICD-10-CM | POA: Diagnosis not present

## 2020-01-30 DIAGNOSIS — R2240 Localized swelling, mass and lump, unspecified lower limb: Secondary | ICD-10-CM | POA: Diagnosis present

## 2020-01-30 DIAGNOSIS — L0390 Cellulitis, unspecified: Secondary | ICD-10-CM | POA: Diagnosis not present

## 2020-01-30 LAB — CBC WITH DIFFERENTIAL/PLATELET
Abs Immature Granulocytes: 0.03 10*3/uL (ref 0.00–0.07)
Basophils Absolute: 0 10*3/uL (ref 0.0–0.1)
Basophils Relative: 1 %
Eosinophils Absolute: 0.4 10*3/uL (ref 0.0–0.5)
Eosinophils Relative: 5 %
HCT: 40.7 % (ref 36.0–46.0)
Hemoglobin: 13.2 g/dL (ref 12.0–15.0)
Immature Granulocytes: 0 %
Lymphocytes Relative: 26 %
Lymphs Abs: 2.2 10*3/uL (ref 0.7–4.0)
MCH: 30.6 pg (ref 26.0–34.0)
MCHC: 32.4 g/dL (ref 30.0–36.0)
MCV: 94.2 fL (ref 80.0–100.0)
Monocytes Absolute: 0.7 10*3/uL (ref 0.1–1.0)
Monocytes Relative: 9 %
Neutro Abs: 5 10*3/uL (ref 1.7–7.7)
Neutrophils Relative %: 59 %
Platelets: 278 10*3/uL (ref 150–400)
RBC: 4.32 MIL/uL (ref 3.87–5.11)
RDW: 13.2 % (ref 11.5–15.5)
WBC: 8.4 10*3/uL (ref 4.0–10.5)
nRBC: 0 % (ref 0.0–0.2)

## 2020-01-30 LAB — COMPREHENSIVE METABOLIC PANEL
ALT: 20 U/L (ref 0–44)
AST: 29 U/L (ref 15–41)
Albumin: 4.1 g/dL (ref 3.5–5.0)
Alkaline Phosphatase: 60 U/L (ref 38–126)
Anion gap: 12 (ref 5–15)
BUN: 12 mg/dL (ref 8–23)
CO2: 24 mmol/L (ref 22–32)
Calcium: 9.1 mg/dL (ref 8.9–10.3)
Chloride: 100 mmol/L (ref 98–111)
Creatinine, Ser: 0.86 mg/dL (ref 0.44–1.00)
GFR, Estimated: >60 mL/min (ref >60)
Glucose, Bld: 152 mg/dL — ABNORMAL HIGH (ref 70–99)
Potassium: 3.3 mmol/L — ABNORMAL LOW (ref 3.5–5.1)
Sodium: 136 mmol/L (ref 135–145)
Total Bilirubin: 0.5 mg/dL (ref 0.3–1.2)
Total Protein: 6.9 g/dL (ref 6.5–8.1)

## 2020-01-30 NOTE — ED Triage Notes (Signed)
Pt c/o bilateral ankle and foot swelling with redness since 01/23/20.

## 2020-01-31 ENCOUNTER — Emergency Department (HOSPITAL_COMMUNITY)
Admission: EM | Admit: 2020-01-31 | Discharge: 2020-01-31 | Disposition: A | Discharge status: Home / Self Care | Payer: Medicare Other | Attending: Emergency Medicine | Admitting: Emergency Medicine

## 2020-01-31 ENCOUNTER — Ambulatory Visit: Payer: Medicare Other | Admitting: Orthopaedic Surgery

## 2020-01-31 ENCOUNTER — Emergency Department (HOSPITAL_COMMUNITY): Payer: Medicare Other

## 2020-01-31 DIAGNOSIS — I700 Atherosclerosis of aorta: Secondary | ICD-10-CM | POA: Diagnosis not present

## 2020-01-31 DIAGNOSIS — J811 Chronic pulmonary edema: Secondary | ICD-10-CM | POA: Diagnosis not present

## 2020-01-31 DIAGNOSIS — R600 Localized edema: Secondary | ICD-10-CM

## 2020-01-31 DIAGNOSIS — I872 Venous insufficiency (chronic) (peripheral): Secondary | ICD-10-CM

## 2020-01-31 DIAGNOSIS — R609 Edema, unspecified: Secondary | ICD-10-CM

## 2020-01-31 LAB — URINALYSIS, ROUTINE W REFLEX MICROSCOPIC
Bilirubin Urine: NEGATIVE
Glucose, UA: NEGATIVE mg/dL
Hgb urine dipstick: NEGATIVE
Ketones, ur: 20 mg/dL — AB
Nitrite: NEGATIVE
Protein, ur: 30 mg/dL — AB
Specific Gravity, Urine: 1.016 (ref 1.005–1.030)
pH: 8 (ref 5.0–8.0)

## 2020-01-31 LAB — BRAIN NATRIURETIC PEPTIDE: B Natriuretic Peptide: 37 pg/mL (ref 0.0–100.0)

## 2020-01-31 MED ORDER — FUROSEMIDE 10 MG/ML IJ SOLN
40.0000 mg | Freq: Once | INTRAMUSCULAR | Status: AC
  Administered 2020-01-31: 40 mg via INTRAVENOUS
  Filled 2020-01-31: qty 4

## 2020-01-31 MED ORDER — FUROSEMIDE 20 MG PO TABS: 40.0000 mg | ORAL_TABLET | Freq: Every day | ORAL | 0 refills | Status: DC

## 2020-01-31 MED ORDER — POTASSIUM CHLORIDE CRYS ER 20 MEQ PO TBCR
40.0000 meq | EXTENDED_RELEASE_TABLET | Freq: Once | ORAL | Status: AC
  Administered 2020-01-31: 40 meq via ORAL
  Filled 2020-01-31: qty 2

## 2020-01-31 MED ORDER — POTASSIUM CHLORIDE CRYS ER 20 MEQ PO TBCR: 40.0000 meq | EXTENDED_RELEASE_TABLET | Freq: Every day | ORAL | 0 refills | Status: DC

## 2020-01-31 NOTE — ED Provider Notes (Signed)
[facility] EMERGENCY DEPARTMENT Provider Note   CSN: [id number] Arrival date & time: 01/30/20  1326     History Chief Complaint  Patient presents with  . Leg Swelling    Heidi Fuentes is a 74 y.o. female.  Patient presents to the emergency department for evaluation of leg swelling.  Patient was referred to the emergency department from urgent care.  Patient reports that her legs have been swollen for 1 week.  She does take Lasix.  She reports that she has not taken any additional axis, just her normal 40 mg daily since she has noticed the swelling.  She has been trying to elevate her legs as much as possible, but it is not helping.  Her feet and ankles are red today which is why urgent care center to the ED.  Patient reports that she has having pain in both of her feet which is causing her to have difficulty walking.        Past Medical History:  Diagnosis Date  . Arthritis   . Asthma   . Back pain   . Chronic pain syndrome 07/10/2013  . Depression   . Duodenal papillary stenosis   . Dyspnea   . Esophageal dysmotility   . Fibromyalgia   . GERD (gastroesophageal reflux disease)   . Glaucoma   . H/O wheezing   . History of hiatal hernia   . History of palpitations   . HOH (hard of hearing)   . HOH (hard of hearing)   . Hypertension   . Hypothyroidism   . Macular degeneration   . Orthopnea   . Stenosis, spinal, lumbar   . Thyroid disease   . Tubular adenoma   . 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[facility])     Patient Active Problem List   Diagnosis Date Noted  . Pain in joint involving ankle and foot 12/20/2019  . Bronchiectasis without complication ([place]) 63/02/6008  . DOE (dyspnea on exertion) 07/27/2019  . Dysphagia 07/25/2018  . Leukocytosis 07/25/2018  . Lung mass 07/25/2018  . Closed fracture of left orbital floor ([place]) 07/25/2018  . Exudative age-related macular degeneration, bilateral, with active choroidal neovascularization ([place]) 07/25/2018  . CAP  (community acquired pneumonia) due to MRSA (methicillin resistant Staphylococcus aureus) ([place]) 02/13/2018  . CAP (community acquired pneumonia) due to Pseudomonas species ([place]) 02/04/2018  . Asthma, chronic, unspecified asthma severity, with acute exacerbation 02/04/2018  . Cough 11/02/2017  . Gastroesophageal reflux disease without esophagitis 10/30/2017  . Other specified glaucoma 10/30/2017  . Uncomplicated asthma 93/23/5573  . Chronic rhinitis 10/30/2017  . Long-term current use of opiate analgesic 03/10/2017  . Essential hypertension 02/17/2017  . On long term drug therapy 02/08/2017  . Chronic narcotic use 02/04/2017  . Hiatal hernia 02/03/2017  . Hypokalemia 09/06/2015  . Prolonged Q-T interval on ECG 09/06/2015  . Fall   . Iron deficiency anemia, unspecified 12/15/2013  . Schatzki's ring 08/17/2013  . Anemia 08/17/2013  . Encounter for screening colonoscopy 08/17/2013  . Other constipation 07/10/2013  . Chronic pain syndrome 07/10/2013  . Bipolar disorder ([place]) 07/10/2013  . Other specified hypothyroidism 07/09/2013  . Colitis 07/09/2013  . Asthma 07/09/2013    Past Surgical History:  Procedure Laterality Date  . CATARACT EXTRACTION W/PHACO Right 10/06/2016   Procedure: CATARACT EXTRACTION PHACO AND INTRAOCULAR LENS PLACEMENT (IOC);  Surgeon: Birder Robson, MD;  Location: [facility] ORS;  Service: Ophthalmology;  Laterality: Right;  [place] 00:32.5 AP% 14.5 CDE 4.71 Fluid pack lot # [id number]  .  CATARACT EXTRACTION W/PHACO Left 11/03/2016   Procedure: CATARACT EXTRACTION PHACO AND INTRAOCULAR LENS PLACEMENT (IOC);  Surgeon: Birder Robson, MD;  Location: [facility] ORS;  Service: Ophthalmology;  Laterality: Left;  [place] 00:36.6 AP% 16.0 CDE 5.86 Fluid Pack lot # [id number]  . COCCYX REMOVAL    . colonoscopy  2005   Dr. Laural Golden: mild melanosis coli, otherwise normal  . COLONOSCOPY WITH PROPOFOL N/A 09/14/2013   VEH:MCNOBSJGG coli. Colonic diverticulosis. Single colonic. Tubular adenoma.  Next TCS 09/2020.  [name] ERCP  1997   [facility]: biliary manometry abnormal, subsequent sphincterotomy   . ESOPHAGEAL MANOMETRY N/A 12/21/2016   Procedure: ESOPHAGEAL MANOMETRY (EM);  Surgeon: Mauri Pole, MD;  Location: [facility] ENDOSCOPY;  Service: Endoscopy;  Laterality: N/A;  . ESOPHAGOGASTRODUODENOSCOPY (EGD) WITH PROPOFOL N/A 09/14/2013   EZM:OQHUTMLY'Y ring. Hiatal hernia. Status post Venia Minks and biopsy disruption.   [name] FOOT SURGERY     X2  . HEMORROIDECTOMY    . HERNIA REPAIR     inguinal right  . [place] N/A 09/14/2013   Procedure: Venia Minks DILATION;  Surgeon: [name], MD;  Location: [facility] ORS;  Service: Endoscopy;  Laterality: N/A;  56  . POLYPECTOMY N/A 09/14/2013   Procedure: POLYPECTOMY;  Surgeon: [name], MD;  Location: [facility] ORS;  Service: Endoscopy;  Laterality: N/A;  . TONSILLECTOMY       OB History    Gravida  2   Para  2   Term  2   Preterm      AB      Living        SAB      IAB      Ectopic      Multiple      Live Births              Family History  Problem Relation Age of Onset  . Lung cancer Mother   . Heart attack Father   . Bone cancer Brother   . COPD Sister   . Colon cancer Neg Hx     Social History   Tobacco Use  . Smoking status: Never Smoker  . Smokeless tobacco: Never Used  . Tobacco comment: NEVER SMOKED  Vaping Use  . Vaping Use: Never used  Substance Use Topics  . Alcohol use: No  . Drug use: No    Home Medications Prior to Admission medications   Medication Sig Start Date End Date Taking? Authorizing Provider  furosemide (LASIX) 20 MG tablet Take 2 tablets (40 mg total) by mouth daily. 01/31/20  Yes [place] Dube, Gwenyth Allegra, MD  potassium chloride SA (KLOR-CON) 20 MEQ tablet Take 2 tablets (40 mEq total) by mouth daily. 01/31/20  Yes [place] Ancrum, Gwenyth Allegra, MD  acetaminophen (TYLENOL) 500 MG tablet Take 500 mg by mouth every 6 (six) hours as needed. For pain    [provider]   albuterol (VENTOLIN HFA) 108 (90 Base) MCG/ACT inhaler Inhale 2 puffs into the lungs every 6 (six) hours as needed for wheezing or shortness of breath. For shortness of breath    [provider]  Biotin 10000 MCG TABS Take 1 capsule by mouth daily.    [provider]  buPROPion (WELLBUTRIN SR) 200 MG 12 hr tablet Take 200 mg by mouth daily.    [provider]  cetirizine (ZYRTEC) 10 MG tablet Take 10 mg by mouth daily.    [provider]  cholecalciferol (VITAMIN D3) 25 MCG (1000 UNIT)  tablet Take 1,000 Units by mouth daily.    [provider]  diltiazem (CARDIZEM) 60 MG tablet Take 60 mg by mouth daily.    [provider]  FLUoxetine (PROZAC) 20 MG capsule Take 20 mg by mouth daily.  12/09/17   [provider]  fluticasone (FLONASE) 50 MCG/ACT nasal spray Place 2 sprays into both nostrils daily.    [provider]  Fluticasone-Salmeterol 113-14 MCG/ACT AEPB 2 puffs daily. 06/30/19   [provider]  furosemide (LASIX) 40 MG tablet Take 40 mg by mouth daily.    [provider]  gabapentin (NEURONTIN) 100 MG capsule Take 100 mg by mouth 3 (three) times daily.  11/17/17   [provider]  ipratropium-albuterol (DUONEB) 0.5-2.5 (3) MG/3ML SOLN Inhale 3 mLs into the lungs every 6 (six) hours as needed. 11/18/17   [provider]  lamoTRIgine (LAMICTAL) 200 MG tablet Take 200 mg by mouth 2 (two) times daily. 06/05/19   [provider]  latanoprost (XALATAN) 0.005 % ophthalmic solution Place 1 drop into both eyes at bedtime. 04/20/13   [provider]  levothyroxine (SYNTHROID, LEVOTHROID) 50 MCG tablet Take 50 mcg by mouth daily.    [provider]  losartan (COZAAR) 50 MG tablet Take 50 mg by mouth daily.    [provider]  meclizine (ANTIVERT) 25 MG tablet Take 25 mg by mouth 3 (three) times daily as needed for dizziness.  08/02/17   [provider]   Melatonin 3 MG CAPS Take 3 mg by mouth at bedtime as needed (for sleep).  11/17/17   [provider]  montelukast (SINGULAIR) 10 MG tablet Take 10 mg by mouth daily. 05/03/13   [provider]  Multiple Vitamins-Minerals (PRESERVISION/LUTEIN PO) Take 1 tablet by mouth 2 (two) times daily.     [provider]  Oxycodone HCl 10 MG TABS Take 10 mg by mouth every 6 (six) hours.    [provider]  polyethylene glycol powder (GLYCOLAX/MIRALAX) 17 GM/SCOOP powder Take 17 g by mouth daily as needed for mild constipation or moderate constipation.  02/16/18   [provider]  tiZANidine (ZANAFLEX) 4 MG tablet Take 4 mg by mouth 2 (two) times daily as needed for muscle spasms.    [provider]    Allergies    Abilify [aripiprazole], Geodon [ziprasidone hcl], Trazodone and nefazodone, Quetiapine, and Sulfa antibiotics  Review of Systems   Review of Systems  Cardiovascular: Positive for leg swelling.  All other systems reviewed and are negative.   Physical Exam Updated Vital Signs BP (!) 178/90   Pulse 84   Temp 98.5 F (36.9 C) (Oral)   Resp 19   Ht 5' (1.524 m)   Wt 69.4 kg   SpO2 97%   BMI 29.88 kg/m   Physical Exam Vitals and nursing note reviewed.  Constitutional:      General: She is not in acute distress.    Appearance: Normal appearance. She is well-developed and well-nourished.  HENT:     Head: Normocephalic and atraumatic.     Right Ear: Hearing normal.     Left Ear: Hearing normal.     Nose: Nose normal.     Mouth/Throat:     Mouth: Oropharynx is clear and moist and mucous membranes are normal.  Eyes:     Extraocular Movements: EOM normal.     Conjunctiva/sclera: Conjunctivae normal.     Pupils: Pupils are equal, round, and reactive to light.  Cardiovascular:  Rate and Rhythm: Regular rhythm.     Heart sounds: S1 normal and S2 normal. No murmur heard. No friction rub. No gallop.   Pulmonary:     Effort:  Pulmonary effort is normal. No respiratory distress.     Breath sounds: Normal breath sounds.  Chest:     Chest wall: No tenderness.  Abdominal:     General: Bowel sounds are normal.     Palpations: Abdomen is soft. There is no hepatosplenomegaly.     Tenderness: There is no abdominal tenderness. There is no guarding or rebound. Negative signs include Murphy's sign and McBurney's sign.     Hernia: No hernia is present.  Musculoskeletal:        General: Normal range of motion.     Cervical back: Normal range of motion and neck supple.     Right lower leg: Edema present.     Left lower leg: Edema present.  Skin:    General: Skin is warm, dry and intact.     Findings: No rash.     Nails: There is no cyanosis.     Comments: Faint symmetric erythema of bilateral feet and ankles without increased warmth  Neurological:     Mental Status: She is alert and oriented to person, place, and time.     GCS: GCS eye subscore is 4. GCS verbal subscore is 5. GCS motor subscore is 6.     Cranial Nerves: No cranial nerve deficit.     Sensory: No sensory deficit.     Coordination: Coordination normal.     Deep Tendon Reflexes: Strength normal.  Psychiatric:        Mood and Affect: Mood and affect normal.        Speech: Speech normal.        Behavior: Behavior normal.        Thought Content: Thought content normal.     ED Results / Procedures / Treatments   Labs (all labs ordered are listed, but only abnormal results are displayed) Labs Reviewed  COMPREHENSIVE METABOLIC PANEL - Abnormal; Notable for the following components:      Result Value   Potassium 3.3 (*)    Glucose, Bld 152 (*)    All other components within normal limits  URINALYSIS, ROUTINE W REFLEX MICROSCOPIC - Abnormal; Notable for the following components:   APPearance HAZY (*)    Ketones, ur 20 (*)    Protein, ur 30 (*)    Leukocytes,Ua MODERATE (*)    Bacteria, UA RARE (*)    All other components within normal limits  CBC  WITH DIFFERENTIAL/PLATELET  BRAIN NATRIURETIC PEPTIDE  CBC WITH DIFFERENTIAL/PLATELET    EKG None  Radiology DG Chest Port 1 View  Result Date: 01/31/2020 CLINICAL DATA:  Edema EXAM: PORTABLE CHEST 1 VIEW COMPARISON:  Jun 27, 2019 FINDINGS: The heart size and mediastinal contours are within normal limits. Aortic knob calcifications are seen. Both lungs are clear. The visualized skeletal structures are unremarkable. IMPRESSION: No active disease. Electronically Signed   By: Prudencio Pair M.D.   On: 01/31/2020 01:01    Procedures Procedures (including critical care time)  Medications Ordered in ED Medications  furosemide (LASIX) injection 40 mg (40 mg Intravenous Given 01/31/20 0059)  potassium chloride SA (KLOR-CON) CR tablet 40 mEq (40 mEq Oral Given 01/31/20 0100)    ED Course  I have reviewed the triage vital signs and the nursing notes.  Pertinent labs & imaging results that were available during my  care of the patient were reviewed by me and considered in my medical decision making (see chart for details).    MDM Rules/Calculators/A&P                          Patient presents with concerns over possible cellulitis.  Examination reveals faint erythema of both lower extremities that is symmetric and not associated with fever, leukocytosis.  This is consistent with a stasis dermatitis.  She has significant swelling of both lower extremities that is symmetric as well.  Patient does not clinically have signs of congestive heart failure.  Oxygen saturations are normal.  Chest x-ray was also clear, no evidence of volume overload.  BNP is normal.  Presentation is consistent with peripheral edema with stasis dermatitis.  Patient administered IV Lasix and has initiated diuresis.  We will have her take double Lasix dosing for the next 3 days.  Potassium supplementation provided.  Follow-up with PCP.  Final Clinical Impression(s) / ED Diagnoses Final diagnoses:  Peripheral edema  Venous  stasis dermatitis of both lower extremities    Rx / DC Orders ED Discharge Orders         Ordered    furosemide (LASIX) 20 MG tablet  Daily        01/31/20 0213    potassium chloride SA (KLOR-CON) 20 MEQ tablet  Daily        01/31/20 0213           Orpah Greek, MD 01/31/20 [phone]

## 2020-01-31 NOTE — Discharge Instructions (Addendum)
Take double your normal Lasix dose for the next 3 days.  Take the potassium supplementation when you are taking increased Lasix.  Schedule follow-up with your family doctor.  Return to the ER for any worsening symptoms.

## 2020-02-09 ENCOUNTER — Telehealth: Payer: Self-pay | Admitting: Pulmonary Disease

## 2020-02-09 NOTE — Telephone Encounter (Signed)
ATC patient but she did not answer and her VM was not setup. Will call back on Monday.

## 2020-02-19 ENCOUNTER — Ambulatory Visit: Payer: Medicare Other | Admitting: Orthopaedic Surgery

## 2020-02-21 ENCOUNTER — Inpatient Hospital Stay: Payer: Medicare Other | Admitting: Primary Care

## 2020-02-23 NOTE — Telephone Encounter (Signed)
lmctcb for pt

## 2020-02-28 ENCOUNTER — Ambulatory Visit: Payer: Medicare Other | Admitting: Orthopaedic Surgery

## 2020-02-29 NOTE — Telephone Encounter (Signed)
ATC pt. VM box has not been set up yet. WCB.

## 2020-03-08 NOTE — Telephone Encounter (Signed)
Due to several unsuccessful attempts to reach pt this message will be closed. Pt has an appt with RA on 2.14.22.

## 2020-03-18 ENCOUNTER — Ambulatory Visit (INDEPENDENT_AMBULATORY_CARE_PROVIDER_SITE_OTHER): Payer: Medicare Other | Admitting: Pulmonary Disease

## 2020-03-18 ENCOUNTER — Other Ambulatory Visit: Payer: Self-pay

## 2020-03-18 ENCOUNTER — Encounter: Payer: Self-pay | Admitting: Pulmonary Disease

## 2020-03-18 DIAGNOSIS — R600 Localized edema: Secondary | ICD-10-CM | POA: Diagnosis not present

## 2020-03-18 DIAGNOSIS — J479 Bronchiectasis, uncomplicated: Secondary | ICD-10-CM | POA: Diagnosis not present

## 2020-03-18 DIAGNOSIS — J45901 Unspecified asthma with (acute) exacerbation: Secondary | ICD-10-CM

## 2020-03-18 MED ORDER — BREZTRI AEROSPHERE 160-9-4.8 MCG/ACT IN AERO: 2.0000 | INHALATION_SPRAY | Freq: Two times a day (BID) | RESPIRATORY_TRACT | 3 refills | Status: DC

## 2020-03-18 NOTE — Progress Notes (Signed)
   Subjective:    Patient ID: Heidi Fuentes, female    DOB: Apr 18, 1945, 75 y.o.   MRN: [id number]  HPI  75 year old never smokerfor follow-up of asthma/fixed airway obstruction, mild bronchiectasis and persistent shortness of breath 02/2018 >>severeMRSA and Pseudomonas pneumonia.  She reports lifelong history of asthma starting as a child,grew out of this as an adult but this is recurrent over the past few years. Shewason Advair for several years and this was stopped since symptoms were felt to be intermittent after pulmonology follow-up at [facility] in 05/2018.   PMH - chronic pain on oxycodone  Meds-Breztri worked well but very expensive.  Symbicort is no longer covered either   33-month follow-up, she continues to complain of shortness of breath with activity. ED visit 12/29 for leg swelling for 1 week chest x-ray independently reviewed, clear, no effusions, BNP was 37, given 40 mg of Lasix and she diuresed well and breathing improved.  She reports pedal edema on and off.  Reviewed 2D echo from 09/2019 which only shows grade 1 diastolic dysfunction. Weight has decreased from 157 last visit to 147 today She remains on Advair, reports occasional use of albuterol for rescue   Significant tests/ events reviewed  AEC 200  PFTs 10/2019 moderate airway obstruction with ratio 55, FEV1 0.89/50%, improved to 1.07/60% with bronchodilator, FVC 67%/1.78  07/27/19 OV >>On ambulation, saturation stayed between 100 to 97% on walking 3 laps at her own pace  CT chest WO contrast 07/2019 >>Progressive postinflammatory or postinfectious scarring and subsegmental atelectasis in both lower lobes,mild bronchiectasis  CT chest 07/2018 shows resolution of consolidation with small 4 to 5 mm bilateral lung nodules.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 Also denies presyncope, palpitations, heartburn, abdominal pain, nausea, vomiting, diarrhea or change in bowel or urinary habits, dysuria,hematuria, rash, arthralgias, visual complaints, headache, numbness weakness or ataxia.     Objective:   Physical Exam  Gen. Pleasant, well-nourished, elderly,in no distress ENT - no thrush, no pallor/icterus,no post nasal drip Neck: No JVD, no thyromegaly, no carotid bruits Lungs: no use of accessory muscles, no dullness to percussion, right basal rales no rhonchi  Cardiovascular: Rhythm regular, heart sounds  normal, no murmurs or gallops, 1+ peripheral edema Musculoskeletal: No deformities, no cyanosis or clubbing        Assessment & Plan:

## 2020-03-18 NOTE — Assessment & Plan Note (Signed)
Appears postinflammatory and likely sequelae of previous pneumonia, airway clearance does not seem to be main issue here

## 2020-03-18 NOTE — Patient Instructions (Signed)
  Rx for breztri 2 puffs twice daily  X 63month x 3 refills  Weigh yourself at home - aim for 145 lbs, If wt goes up to 150 lbs, take an extra dose of lasix

## 2020-03-18 NOTE — Assessment & Plan Note (Signed)
She only has grade 1 diastolic dysfunction so do not think this is chronic diastolic heart failure, BNP was low.  But she did have symptomatic improvement after Lasix and diuresis and weight loss of 10 pounds. We discussed weight-based dosing of Lasix especially if her weight increases above 150 pounds, she will take an extra dose of 20 mg

## 2020-03-18 NOTE — Assessment & Plan Note (Signed)
Continue Advair for now. Breztri seem to work better but this was not covered on her insurance last year and was very expensive, which she will check again if this is covered on her insurance this year, appears to be preferred

## 2020-03-26 ENCOUNTER — Encounter: Payer: Self-pay | Admitting: Orthopaedic Surgery

## 2020-03-26 ENCOUNTER — Ambulatory Visit (INDEPENDENT_AMBULATORY_CARE_PROVIDER_SITE_OTHER): Payer: Medicare Other | Admitting: Orthopaedic Surgery

## 2020-03-26 DIAGNOSIS — M1612 Unilateral primary osteoarthritis, left hip: Secondary | ICD-10-CM | POA: Diagnosis not present

## 2020-03-26 DIAGNOSIS — M7062 Trochanteric bursitis, left hip: Secondary | ICD-10-CM

## 2020-03-26 MED ORDER — METHYLPREDNISOLONE ACETATE 40 MG/ML IJ SUSP
40.0000 mg | INTRAMUSCULAR | Status: AC | PRN
  Administered 2020-03-26: 40 mg via INTRA_ARTICULAR

## 2020-03-26 MED ORDER — LIDOCAINE HCL 1 % IJ SOLN
3.0000 mL | INTRAMUSCULAR | Status: AC | PRN
  Administered 2020-03-26: 3 mL

## 2020-03-26 NOTE — Progress Notes (Signed)
Office Visit Note   Patient: Heidi Fuentes           Date of Birth: 10-25-1945           MRN: [id number] Visit Date: 03/26/2020              Requested by: Carole Civil, MD [address] [place],  [place] [address] PCP: Alfonse Flavors, MD   Assessment & Plan: Visit Diagnoses:  1. Unilateral primary osteoarthritis, left hip   2. Trochanteric bursitis, left hip     Plan: I do feel it is worthwhile trying a series of injections around her left hip to see how this modulates or controls her pain.  I am not convinced a hip replacement is what she needs based on where she hurts in her low back to the left side.  I did provide a steroid injection over the left hip trochanteric area and would like to set her up for an intra-articular injection in her left hip joint under direct fluoroscopy by Dr. Ernestina Patches.  Once he does provide that injection he can get her back to see me for follow-up.  My main concern is the narcotics that she is on.  He would even require stronger narcotics than that to get her through the pain postoperatively from surgery which can be something that I am uncomfortable with providing.  All question concerns were answered and addressed.  I will see her back in follow-up after an intra-articular steroid injection in her left hip.  Follow-Up Instructions: No follow-ups on file.   Orders:  Orders Placed This Encounter  Procedures  . Large Joint Inj   No orders of the defined types were placed in this encounter.     Procedures: Large Joint Inj: L greater trochanter on 03/26/2020 10:10 AM Indications: pain and diagnostic evaluation Details: 22 G 1.5 in needle, lateral approach  Arthrogram: No  Medications: 3 mL lidocaine 1 %; 40 mg methylPREDNISolone acetate 40 MG/ML Outcome: tolerated well, no immediate complications Procedure, treatment alternatives, risks and benefits explained, specific risks discussed. Consent was given by the patient. Immediately  prior to procedure a time out was called to verify the correct patient, procedure, equipment, support staff and site/side marked as required. Patient was prepped and draped in the usual sterile fashion.       Clinical Data: No additional findings.   Subjective: Chief Complaint  Patient presents with  . Left Hip - Pain  The patient sent down from [facility] by Dr. Luna Glasgow for [place] to consider a left hip replacement on given the arthritis that has been documented in her left hip.  She does not report much groin pain and points to the posterior pelvis as the main source of her pain and it does go down the side of her leg some.  She ambulates without an assistive device but gets around very slowly.  She is 75 years old.  She is on chronic oxycodone which is 10 mg 4 times a day which she has taken to maintain her pain.  She does report weakness in her leg as well.  She has had a recent MRI of her left hip that showed thinning of the articular cartilage but no full-thickness cartilage loss.  HPI  Review of Systems She currently denies any headache, chest pain, shortness of breath, fever, chills, nausea, vomiting  Objective: Vital Signs: There were no vitals taken for this visit.  Physical Exam She is alert and orient x3 and  in no acute distress Ortho Exam I can easily put her left hip through internal and external rotation and the pain seems to be at the posterior pelvis and low back as well as the lateral side of her hip. Specialty Comments:  No specialty comments available.  Imaging: No results found. I did review the MRI of her left hip as well as plain films.  She does have moderate arthritis of her left hip joint.  PMFS History: Patient Active Problem List   Diagnosis Date Noted  . Unilateral primary osteoarthritis, left hip 03/26/2020  . Pedal edema 03/18/2020  . Pain in joint involving ankle and foot 12/20/2019  . Bronchiectasis without complication ([facility]) 16/11/9602  . DOE  (dyspnea on exertion) 07/27/2019  . Dysphagia 07/25/2018  . Leukocytosis 07/25/2018  . Closed fracture of left orbital floor ([facility]) 07/25/2018  . Exudative age-related macular degeneration, bilateral, with active choroidal neovascularization ([facility]) 07/25/2018  . CAP (community acquired pneumonia) due to MRSA (methicillin resistant Staphylococcus aureus) ([facility]) 02/13/2018  . Asthma, chronic, unspecified asthma severity, with acute exacerbation 02/04/2018  . Cough 11/02/2017  . Gastroesophageal reflux disease without esophagitis 10/30/2017  . Other specified glaucoma 10/30/2017  . Uncomplicated asthma 54/10/8117  . Chronic rhinitis 10/30/2017  . Long-term current use of opiate analgesic 03/10/2017  . Essential hypertension 02/17/2017  . On long term drug therapy 02/08/2017  . Chronic narcotic use 02/04/2017  . Hiatal hernia 02/03/2017  . Hypokalemia 09/06/2015  . Prolonged Q-T interval on ECG 09/06/2015  . Fall   . Iron deficiency anemia, unspecified 12/15/2013  . Schatzki's ring 08/17/2013  . Anemia 08/17/2013  . Encounter for screening colonoscopy 08/17/2013  . Other constipation 07/10/2013  . Chronic pain syndrome 07/10/2013  . Bipolar disorder ([place]) 07/10/2013  . Other specified hypothyroidism 07/09/2013  . Colitis 07/09/2013  . Asthma 07/09/2013   Past Medical History:  Diagnosis Date  . Arthritis   . Asthma   . Back pain   . Chronic pain syndrome 07/10/2013  . Depression   . Duodenal papillary stenosis   . Dyspnea   . Esophageal dysmotility   . Fibromyalgia   . GERD (gastroesophageal reflux disease)   . Glaucoma   . H/O wheezing   . History of hiatal hernia   . History of palpitations   . HOH (hard of hearing)   . HOH (hard of hearing)   . Hypertension   . Hypothyroidism   . Macular degeneration   . Orthopnea   . Stenosis, spinal, lumbar   . Thyroid disease   . Tubular adenoma   . 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([facility])     Family History  Problem Relation Age  of Onset  . Lung cancer Mother   . Heart attack Father   . Bone cancer Brother   . COPD Sister   . Colon cancer Neg Hx     Past Surgical History:  Procedure Laterality Date  . CATARACT EXTRACTION W/PHACO Right 10/06/2016   Procedure: CATARACT EXTRACTION PHACO AND INTRAOCULAR LENS PLACEMENT (IOC);  Surgeon: Birder Robson, MD;  Location: [facility] ORS;  Service: Ophthalmology;  Laterality: Right;  [place] 00:32.5 AP% 14.5 CDE 4.71 Fluid pack lot # [id number]  . CATARACT EXTRACTION W/PHACO Left 11/03/2016   Procedure: CATARACT EXTRACTION PHACO AND INTRAOCULAR LENS PLACEMENT (IOC);  Surgeon: Birder Robson, MD;  Location: [facility] ORS;  Service: Ophthalmology;  Laterality: Left;  [place] 00:36.6 AP% 16.0 CDE 5.86 Fluid Pack lot # [id number]  . COCCYX REMOVAL    . colonoscopy  2005   Dr. Laural Golden: mild melanosis coli, otherwise normal  . COLONOSCOPY WITH PROPOFOL N/A 09/14/2013   XYB:FXOVANVBT coli. Colonic diverticulosis. Single colonic. Tubular adenoma. Next TCS 09/2020.  [name] ERCP  1997   [facility]: biliary manometry abnormal, subsequent sphincterotomy   . ESOPHAGEAL MANOMETRY N/A 12/21/2016   Procedure: ESOPHAGEAL MANOMETRY (EM);  Surgeon: Mauri Pole, MD;  Location: [facility];  Service: Endoscopy;  Laterality: N/A;  . ESOPHAGOGASTRODUODENOSCOPY (EGD) WITH PROPOFOL N/A 09/14/2013   YOM:AYOKHTXH'F ring. Hiatal hernia. Status post Venia Minks and biopsy disruption.   [name] FOOT SURGERY     X2  . HEMORROIDECTOMY    . HERNIA REPAIR     inguinal right  . [place] N/A 09/14/2013   Procedure: Venia Minks DILATION;  Surgeon: [name], MD;  Location: [facility];  Service: Endoscopy;  Laterality: N/A;  56  . POLYPECTOMY N/A 09/14/2013   Procedure: POLYPECTOMY;  Surgeon: [name], MD;  Location: [facility];  Service: Endoscopy;  Laterality: N/A;  . TONSILLECTOMY     Social History   Occupational History  . Not on file  Tobacco Use  . Smoking status: Never Smoker  . Smokeless tobacco:  Never Used  . Tobacco comment: NEVER SMOKED  Vaping Use  . Vaping Use: Never used  Substance and Sexual Activity  . Alcohol use: No  . Drug use: No  . Sexual activity: Not on file

## 2020-03-27 ENCOUNTER — Other Ambulatory Visit: Payer: Self-pay

## 2020-03-27 DIAGNOSIS — M7062 Trochanteric bursitis, left hip: Secondary | ICD-10-CM

## 2020-04-03 ENCOUNTER — Ambulatory Visit (INDEPENDENT_AMBULATORY_CARE_PROVIDER_SITE_OTHER): Payer: Medicare Other | Admitting: Physical Medicine and Rehabilitation

## 2020-04-03 ENCOUNTER — Encounter: Payer: Self-pay | Admitting: Physical Medicine and Rehabilitation

## 2020-04-03 ENCOUNTER — Ambulatory Visit: Payer: Self-pay

## 2020-04-03 ENCOUNTER — Other Ambulatory Visit: Payer: Self-pay

## 2020-04-03 DIAGNOSIS — M25552 Pain in left hip: Secondary | ICD-10-CM | POA: Diagnosis not present

## 2020-04-03 MED ORDER — TRIAMCINOLONE ACETONIDE 40 MG/ML IJ SUSP
60.0000 mg | INTRAMUSCULAR | Status: AC | PRN
Start: 2020-04-03 — End: 2020-04-03
  Administered 2020-04-03: 60 mg via INTRA_ARTICULAR

## 2020-04-03 MED ORDER — BUPIVACAINE HCL 0.25 % IJ SOLN
4.0000 mL | INTRAMUSCULAR | Status: AC | PRN
  Administered 2020-04-03: 4 mL via INTRA_ARTICULAR

## 2020-04-03 NOTE — Progress Notes (Signed)
Pt state left hip pain. Pt state climbing stairs and walking makes the pain worse. Pt state she take pain meds to help ease her pain  Numeric Pain Rating Scale and Functional Assessment Average Pain 2   In the last MONTH (on 0-10 scale) has pain interfered with the following?  1. General activity like being  able to carry out your everyday physical activities such as walking, climbing stairs, carrying groceries, or moving a chair?  Rating(10)

## 2020-04-03 NOTE — Progress Notes (Signed)
Heidi Fuentes - 75 y.o. female MRN [id number]  Date of birth: [birth year]  Office Visit Note: Visit Date: 04/03/2020 PCP: Alfonse Flavors, MD Referred by: Alfonse Flavors  Subjective: Chief Complaint  Patient presents with  . Left Hip - Pain   HPI:  Heidi Fuentes is a 75 y.o. female who comes in today at the request of Dr. Jean Rosenthal for planned Left anesthetic hip arthrogram with fluoroscopic guidance.  The patient has failed conservative care including home exercise, medications, time and activity modification.  This injection will be diagnostic and hopefully therapeutic.  Please see requesting physician notes for further details and justification.  She tells me that she is unsure really why she was here getting the injection and that did take about 10 minutes to explain diagnostic injection with [place] trying to figure out if this is her back or her hip.  She has a longstanding history of back pain and chronic back pain on chronic opioids.  She takes 10 mg of oxycodone every 6 hours.  This is given to her by her primary care physician.  She reports that she has "degenerative disc ".  She reports no history of back surgery.  She reports medications were started left someone at [facility] sometime ago.  She does not get any groin pain but she does get this pain going from sit to stand and is in the posterior lateral hip.  We will have her follow-up with Dr. Ninfa Linden in 2 weeks and I told her to keep notes on how well her hip and back feels after the injection today for the next few hours.   Prior Bursa injection by Dr. Ninfa Linden has helped the lateral thigh pain.  ROS Otherwise per HPI.  Assessment & Plan: Visit Diagnoses:    ICD-10-CM   1. Pain in left hip  M25.552 Large Joint Inj: L hip joint    XR C-ARM NO REPORT    Plan: No additional findings.   Meds & Orders: No orders of the defined types were placed in this encounter.   Orders Placed This  Encounter  Procedures  . Large Joint Inj: L hip joint  . XR C-ARM NO REPORT    Follow-up: Return in about 2 weeks (around 04/17/2020) for Jean Rosenthal, MD.   Procedures: Large Joint Inj: L hip joint on 04/03/2020 2:48 PM Indications: pain and diagnostic evaluation Details: 22 G needle, anterior approach  Arthrogram: Yes  Medications: 4 mL bupivacaine 0.25 %; 60 mg triamcinolone acetonide 40 MG/ML Outcome: tolerated well, no immediate complications  Arthrogram demonstrated excellent flow of contrast throughout the joint surface without extravasation or obvious defect.  The patient had NO relief of symptoms during the anesthetic phase of the injection.  Procedure, treatment alternatives, risks and benefits explained, specific risks discussed. Consent was given by the patient. Immediately prior to procedure a time out was called to verify the correct patient, procedure, equipment, support staff and site/side marked as required. Patient was prepped and draped in the usual sterile fashion.          Clinical History: No specialty comments available.     Objective:  VS:  HT:    WT:   BMI:     BP:   HR: bpm  TEMP: ( )  RESP:  Physical Exam Vitals and nursing note reviewed.  Constitutional:      General: She is not in acute distress.    Appearance: Normal appearance. She is not ill-appearing.  HENT:     Head: Normocephalic and atraumatic.     Right Ear: External ear normal.     Left Ear: External ear normal.  Eyes:     Extraocular Movements: Extraocular movements intact.  Cardiovascular:     Rate and Rhythm: Normal rate.     Pulses: Normal pulses.  Pulmonary:     Effort: Pulmonary effort is normal. No respiratory distress.  Abdominal:     General: There is no distension.     Palpations: Abdomen is soft.  Musculoskeletal:        General: Tenderness present.     Cervical back: Neck supple.     Right lower leg: No edema.     Left lower leg: No edema.      Comments: Patient has good distal strength with no pain over the greater trochanters.  No clonus or focal weakness.  Skin:    Findings: No erythema, lesion or rash.  Neurological:     General: No focal deficit present.     Mental Status: She is alert and oriented to person, place, and time.     Sensory: No sensory deficit.     Motor: No weakness or abnormal muscle tone.     Coordination: Coordination normal.  Psychiatric:        Mood and Affect: Mood normal.        Behavior: Behavior normal.      Imaging: No results found.

## 2020-04-03 NOTE — Patient Instructions (Signed)

## 2020-04-23 ENCOUNTER — Ambulatory Visit: Payer: Medicare Other | Admitting: Orthopaedic Surgery

## 2020-05-16 ENCOUNTER — Encounter: Payer: Self-pay | Admitting: Orthopaedic Surgery

## 2020-05-16 ENCOUNTER — Ambulatory Visit (INDEPENDENT_AMBULATORY_CARE_PROVIDER_SITE_OTHER): Payer: Medicare Other | Admitting: Orthopaedic Surgery

## 2020-05-16 DIAGNOSIS — M7061 Trochanteric bursitis, right hip: Secondary | ICD-10-CM

## 2020-05-16 NOTE — Progress Notes (Signed)
The patient is a 75 year old female who comes for follow-up after having an intra-articular steroid injection in her left hip joint by Dr. Ernestina Patches under fluoroscopy.  I injected her left trochanteric area.  She said the injection to Dr. Ernestina Patches did not help her 1 bit and she still in severe pain.  However she points to her low back as a source of her pain.  She is also having some "bursal pain" with her right hip would like an injection over that side since it did help on her left side in terms of the bursa injection.  She has had several falls since have seen her.  She does walk with her knees and some valgus malalignment.  Both knees do have some patellofemoral crepitation and valgus malalignment.  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  Her left is examined and shows full and fluid range of motion and she is not describing any pain in the groin at all.  She was sent down to me because an MRI showed some moderate arthritis in that left hip.  Her pain seems to be more in her low back to the left side and she does have some trochanteric pain on the right side.  I did place a steroid injection over her right hip trochanteric area without difficulty.  She is on oxycodone 10 mg every 6 hours.  She does take it daily.  I offered physical therapy for her knees and her back and she says she is already been through that.  I told her that I would not be comfortable performing knee replacement surgery on her due to her narcotic needs and I have unfortunately had bad success in patients in chronic pain putting them through knee replacement surgery and being uncomfortable with providing even stronger pain medications.  Also those patients in my hands have not been successful in getting her motion back and getting off any narcotics.  I suggest she get a second opinion about her knees or follow-up back in [place] for her knees.  From my standpoint, I would not recommend a left hip replacement based  on her normal exam today and the fact that the steroid injection her left hip caused no pain relief at all in her pain seems did not be in her left hip.

## 2020-07-08 ENCOUNTER — Telehealth: Payer: Self-pay

## 2020-07-08 NOTE — Telephone Encounter (Signed)
Attempted to contact patient's to schedule a Palliative Care consult appointment at [facility] on 07/10/20. No answer and unable to leave a message voicemail is not setup.

## 2020-07-10 ENCOUNTER — Other Ambulatory Visit (HOSPITAL_COMMUNITY): Payer: Self-pay | Admitting: Family Medicine

## 2020-07-10 ENCOUNTER — Other Ambulatory Visit: Payer: Self-pay

## 2020-07-10 ENCOUNTER — Other Ambulatory Visit: Payer: Self-pay | Admitting: Nurse Practitioner

## 2020-07-10 DIAGNOSIS — Z1231 Encounter for screening mammogram for malignant neoplasm of breast: Secondary | ICD-10-CM

## 2020-07-11 ENCOUNTER — Encounter: Payer: Self-pay | Admitting: *Deleted

## 2020-07-24 ENCOUNTER — Other Ambulatory Visit: Payer: Self-pay

## 2020-07-24 ENCOUNTER — Other Ambulatory Visit: Payer: Medicare Other | Admitting: Nurse Practitioner

## 2020-07-24 ENCOUNTER — Telehealth: Payer: Self-pay | Admitting: Nurse Practitioner

## 2020-07-24 NOTE — Telephone Encounter (Signed)
No show to clinic palliative initial visit

## 2020-08-03 ENCOUNTER — Encounter (HOSPITAL_COMMUNITY): Payer: Self-pay | Admitting: Emergency Medicine

## 2020-08-03 ENCOUNTER — Other Ambulatory Visit: Payer: Self-pay

## 2020-08-03 ENCOUNTER — Inpatient Hospital Stay (HOSPITAL_COMMUNITY)
Admission: EM | Admit: 2020-08-03 | Discharge: 2020-08-05 | DRG: 558 | Disposition: A | Discharge status: Home Health | Payer: Medicare Other | Attending: Family Medicine | Admitting: Family Medicine

## 2020-08-03 ENCOUNTER — Emergency Department (HOSPITAL_COMMUNITY): Payer: Medicare Other

## 2020-08-03 DIAGNOSIS — I10 Essential (primary) hypertension: Secondary | ICD-10-CM | POA: Diagnosis present

## 2020-08-03 DIAGNOSIS — Z825 Family history of asthma and other chronic lower respiratory diseases: Secondary | ICD-10-CM | POA: Diagnosis not present

## 2020-08-03 DIAGNOSIS — M6282 Rhabdomyolysis: Principal | ICD-10-CM | POA: Diagnosis present

## 2020-08-03 DIAGNOSIS — H9190 Unspecified hearing loss, unspecified ear: Secondary | ICD-10-CM | POA: Diagnosis present

## 2020-08-03 DIAGNOSIS — N390 Urinary tract infection, site not specified: Secondary | ICD-10-CM | POA: Diagnosis present

## 2020-08-03 DIAGNOSIS — D72829 Elevated white blood cell count, unspecified: Secondary | ICD-10-CM | POA: Diagnosis present

## 2020-08-03 DIAGNOSIS — J45909 Unspecified asthma, uncomplicated: Secondary | ICD-10-CM | POA: Diagnosis present

## 2020-08-03 DIAGNOSIS — H409 Unspecified glaucoma: Secondary | ICD-10-CM | POA: Diagnosis present

## 2020-08-03 DIAGNOSIS — Z882 Allergy status to sulfonamides status: Secondary | ICD-10-CM | POA: Diagnosis not present

## 2020-08-03 DIAGNOSIS — M797 Fibromyalgia: Secondary | ICD-10-CM | POA: Diagnosis present

## 2020-08-03 DIAGNOSIS — F319 Bipolar disorder, unspecified: Secondary | ICD-10-CM | POA: Diagnosis present

## 2020-08-03 DIAGNOSIS — E038 Other specified hypothyroidism: Secondary | ICD-10-CM | POA: Diagnosis present

## 2020-08-03 DIAGNOSIS — W19XXXA Unspecified fall, initial encounter: Secondary | ICD-10-CM | POA: Diagnosis present

## 2020-08-03 DIAGNOSIS — Z7951 Long term (current) use of inhaled steroids: Secondary | ICD-10-CM | POA: Diagnosis not present

## 2020-08-03 DIAGNOSIS — H353230 Exudative age-related macular degeneration, bilateral, stage unspecified: Secondary | ICD-10-CM | POA: Diagnosis present

## 2020-08-03 DIAGNOSIS — K219 Gastro-esophageal reflux disease without esophagitis: Secondary | ICD-10-CM | POA: Diagnosis present

## 2020-08-03 DIAGNOSIS — R7989 Other specified abnormal findings of blood chemistry: Secondary | ICD-10-CM | POA: Diagnosis present

## 2020-08-03 DIAGNOSIS — Z888 Allergy status to other drugs, medicaments and biological substances status: Secondary | ICD-10-CM

## 2020-08-03 DIAGNOSIS — Z8249 Family history of ischemic heart disease and other diseases of the circulatory system: Secondary | ICD-10-CM

## 2020-08-03 DIAGNOSIS — D509 Iron deficiency anemia, unspecified: Secondary | ICD-10-CM | POA: Diagnosis present

## 2020-08-03 DIAGNOSIS — G894 Chronic pain syndrome: Secondary | ICD-10-CM | POA: Diagnosis present

## 2020-08-03 DIAGNOSIS — Z20822 Contact with and (suspected) exposure to covid-19: Secondary | ICD-10-CM | POA: Diagnosis present

## 2020-08-03 DIAGNOSIS — R296 Repeated falls: Secondary | ICD-10-CM | POA: Diagnosis present

## 2020-08-03 DIAGNOSIS — Y92009 Unspecified place in unspecified non-institutional (private) residence as the place of occurrence of the external cause: Secondary | ICD-10-CM | POA: Diagnosis not present

## 2020-08-03 DIAGNOSIS — Z79890 Hormone replacement therapy (postmenopausal): Secondary | ICD-10-CM | POA: Diagnosis not present

## 2020-08-03 DIAGNOSIS — Z79899 Other long term (current) drug therapy: Secondary | ICD-10-CM

## 2020-08-03 DIAGNOSIS — E039 Hypothyroidism, unspecified: Secondary | ICD-10-CM | POA: Diagnosis present

## 2020-08-03 LAB — COMPREHENSIVE METABOLIC PANEL
ALT: 178 U/L — ABNORMAL HIGH (ref 0–44)
AST: 212 U/L — ABNORMAL HIGH (ref 15–41)
Albumin: 3.5 g/dL (ref 3.5–5.0)
Alkaline Phosphatase: 45 U/L (ref 38–126)
Anion gap: 14 (ref 5–15)
BUN: 26 mg/dL — ABNORMAL HIGH (ref 8–23)
CO2: 22 mmol/L (ref 22–32)
Calcium: 8.5 mg/dL — ABNORMAL LOW (ref 8.9–10.3)
Chloride: 101 mmol/L (ref 98–111)
Creatinine, Ser: 0.7 mg/dL (ref 0.44–1.00)
GFR, Estimated: >60 mL/min (ref >60)
Glucose, Bld: 87 mg/dL (ref 70–99)
Potassium: 3.1 mmol/L — ABNORMAL LOW (ref 3.5–5.1)
Sodium: 137 mmol/L (ref 135–145)
Total Bilirubin: 1.3 mg/dL — ABNORMAL HIGH (ref 0.3–1.2)
Total Protein: 6.1 g/dL — ABNORMAL LOW (ref 6.5–8.1)

## 2020-08-03 LAB — RESP PANEL BY RT-PCR (FLU A&B, COVID) ARPGX2
Influenza A by PCR: NEGATIVE
Influenza B by PCR: NEGATIVE
SARS Coronavirus 2 by RT PCR: NEGATIVE

## 2020-08-03 LAB — CBC WITH DIFFERENTIAL/PLATELET
Abs Immature Granulocytes: 0.07 10*3/uL (ref 0.00–0.07)
Basophils Absolute: 0 10*3/uL (ref 0.0–0.1)
Basophils Relative: 0 %
Eosinophils Absolute: 0 10*3/uL (ref 0.0–0.5)
Eosinophils Relative: 0 %
HCT: 36.6 % (ref 36.0–46.0)
Hemoglobin: 12 g/dL (ref 12.0–15.0)
Immature Granulocytes: 1 %
Lymphocytes Relative: 9 %
Lymphs Abs: 1.3 10*3/uL (ref 0.7–4.0)
MCH: 31.1 pg (ref 26.0–34.0)
MCHC: 32.8 g/dL (ref 30.0–36.0)
MCV: 94.8 fL (ref 80.0–100.0)
Monocytes Absolute: 1.2 10*3/uL — ABNORMAL HIGH (ref 0.1–1.0)
Monocytes Relative: 8 %
Neutro Abs: 12.1 10*3/uL — ABNORMAL HIGH (ref 1.7–7.7)
Neutrophils Relative %: 82 %
Platelets: 303 10*3/uL (ref 150–400)
RBC: 3.86 MIL/uL — ABNORMAL LOW (ref 3.87–5.11)
RDW: 14 % (ref 11.5–15.5)
WBC: 14.7 10*3/uL — ABNORMAL HIGH (ref 4.0–10.5)
nRBC: 0 % (ref 0.0–0.2)

## 2020-08-03 LAB — TROPONIN I (HIGH SENSITIVITY)
Troponin I (High Sensitivity): 27 ng/L — ABNORMAL HIGH (ref <18)
Troponin I (High Sensitivity): 26 ng/L — ABNORMAL HIGH (ref <18)

## 2020-08-03 LAB — CK
Total CK: 3113 U/L — ABNORMAL HIGH (ref 38–234)
Total CK: 2862 U/L — ABNORMAL HIGH (ref 38–234)

## 2020-08-03 MED ORDER — BUPROPION HCL ER (SR) 100 MG PO TB12
200.0000 mg | ORAL_TABLET | Freq: Every day | ORAL | Status: DC
  Administered 2020-08-04 – 2020-08-05 (×2): 200 mg via ORAL
  Filled 2020-08-03 (×2): qty 2

## 2020-08-03 MED ORDER — POTASSIUM CHLORIDE CRYS ER 20 MEQ PO TBCR
40.0000 meq | EXTENDED_RELEASE_TABLET | Freq: Once | ORAL | Status: AC
  Administered 2020-08-03: 40 meq via ORAL
  Filled 2020-08-03: qty 2

## 2020-08-03 MED ORDER — ACETAMINOPHEN 325 MG PO TABS
650.0000 mg | ORAL_TABLET | Freq: Once | ORAL | Status: AC
  Administered 2020-08-03: 650 mg via ORAL
  Filled 2020-08-03: qty 2

## 2020-08-03 MED ORDER — SODIUM CHLORIDE 0.9 % IV BOLUS
500.0000 mL | Freq: Once | INTRAVENOUS | Status: AC
  Administered 2020-08-03: 500 mL via INTRAVENOUS

## 2020-08-03 MED ORDER — LEVOTHYROXINE SODIUM 50 MCG PO TABS
50.0000 ug | ORAL_TABLET | Freq: Every day | ORAL | Status: DC
  Administered 2020-08-04 – 2020-08-05 (×2): 50 ug via ORAL
  Filled 2020-08-03 (×2): qty 1

## 2020-08-03 MED ORDER — GABAPENTIN 100 MG PO CAPS
100.0000 mg | ORAL_CAPSULE | Freq: Three times a day (TID) | ORAL | Status: DC
  Administered 2020-08-03 – 2020-08-05 (×5): 100 mg via ORAL
  Filled 2020-08-03 (×5): qty 1

## 2020-08-03 MED ORDER — POTASSIUM CHLORIDE CRYS ER 20 MEQ PO TBCR
40.0000 meq | EXTENDED_RELEASE_TABLET | Freq: Every day | ORAL | Status: DC
  Administered 2020-08-03 – 2020-08-05 (×3): 40 meq via ORAL
  Filled 2020-08-03 (×3): qty 2

## 2020-08-03 MED ORDER — LACTATED RINGERS IV BOLUS
1000.0000 mL | Freq: Once | INTRAVENOUS | Status: AC
  Administered 2020-08-03: 1000 mL via INTRAVENOUS

## 2020-08-03 MED ORDER — DILTIAZEM HCL 60 MG PO TABS
60.0000 mg | ORAL_TABLET | Freq: Two times a day (BID) | ORAL | Status: DC
  Administered 2020-08-03 – 2020-08-05 (×4): 60 mg via ORAL
  Filled 2020-08-03 (×4): qty 1

## 2020-08-03 MED ORDER — LOSARTAN POTASSIUM 50 MG PO TABS
50.0000 mg | ORAL_TABLET | Freq: Every day | ORAL | Status: DC
  Administered 2020-08-04 – 2020-08-05 (×2): 50 mg via ORAL
  Filled 2020-08-03 (×2): qty 1

## 2020-08-03 MED ORDER — TIZANIDINE HCL 4 MG PO TABS: 4.0000 mg | ORAL_TABLET | Freq: Two times a day (BID) | ORAL | Status: DC | PRN

## 2020-08-03 MED ORDER — POLYETHYLENE GLYCOL 3350 17 G PO PACK: 17.0000 g | PACK | Freq: Every day | ORAL | Status: DC | PRN

## 2020-08-03 MED ORDER — FLUOXETINE HCL 20 MG PO CAPS
40.0000 mg | ORAL_CAPSULE | Freq: Every day | ORAL | Status: DC
  Administered 2020-08-04 – 2020-08-05 (×2): 40 mg via ORAL
  Filled 2020-08-03 (×2): qty 2

## 2020-08-03 MED ORDER — OXYCODONE HCL 5 MG PO TABS
10.0000 mg | ORAL_TABLET | Freq: Four times a day (QID) | ORAL | Status: DC
Start: 2020-08-03 — End: 2020-08-05
  Administered 2020-08-03 – 2020-08-05 (×7): 10 mg via ORAL
  Filled 2020-08-03 (×7): qty 2

## 2020-08-03 MED ORDER — LACTATED RINGERS IV SOLN: INTRAVENOUS | Status: DC

## 2020-08-03 MED ORDER — LAMOTRIGINE 100 MG PO TABS
200.0000 mg | ORAL_TABLET | Freq: Two times a day (BID) | ORAL | Status: DC
  Administered 2020-08-03 – 2020-08-05 (×4): 200 mg via ORAL
  Filled 2020-08-03 (×5): qty 2

## 2020-08-03 NOTE — ED Triage Notes (Signed)
Pt to the ED via [facility] from home after a fall 3 days ago.  Pt was unable to get up or get to a phone.  Pt is having trouble taking care of herself and needs a med evaluation.  Pt has swelling to left knee, bruising to her left breast and right hip. Pt denies taking a blood thinners or LOC.

## 2020-08-03 NOTE — ED Provider Notes (Signed)
[facility] EMERGENCY DEPARTMENT Provider Note   CSN: [id number] Arrival date & time: 08/03/20  1434     History Chief Complaint  Patient presents with   Heidi Fuentes is a 75 y.o. female.  Patient presents complaint of bilateral knee pain and left breast pain.  She states that she fell about 2 or 3 days ago from a standing position.  Her son states that she appears to have been unable to get up off the ground on her own.  Blood culture extremities are checked on her and was concerned and called him to come and check on her as well.  When he came she was still on the floor having a hard time getting up by herself due to weakness.  She denies headache back pain.  Denies fevers cough vomiting or diarrhea.  Patient says she otherwise lives alone her son comes to check on her daily.  She complains of bilateral knee pain and left chest wall pain.      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[facility])     Patient Active Problem List   Diagnosis Date Noted   Unilateral primary osteoarthritis, left hip 03/26/2020   Pedal edema 03/18/2020   Pain in joint involving ankle and foot 12/20/2019   Bronchiectasis without complication ([place]) 63/02/6008   DOE (dyspnea on exertion) 07/27/2019   Dysphagia 07/25/2018   Leukocytosis 07/25/2018   Closed fracture of left orbital floor ([place]) 07/25/2018   Exudative age-related macular degeneration, bilateral, with active  choroidal neovascularization ([place]) 07/25/2018   CAP (community acquired pneumonia) due to MRSA (methicillin resistant Staphylococcus aureus) ([place]) 02/13/2018   Asthma, chronic, unspecified asthma severity, with acute exacerbation 02/04/2018   Cough 11/02/2017   Gastroesophageal reflux disease without esophagitis 10/30/2017   Other specified glaucoma 93/23/5573   Uncomplicated asthma 22/03/5425   Chronic rhinitis 10/30/2017   Long-term current use of opiate analgesic 03/10/2017   Essential hypertension 02/17/2017   On long term drug therapy 02/08/2017   Chronic narcotic use 02/04/2017   Hiatal hernia 02/03/2017   Hypokalemia 09/06/2015   Prolonged Q-T interval on ECG 09/06/2015   Fall    Iron deficiency anemia, unspecified 12/15/2013   Schatzki's ring 08/17/2013   Anemia 08/17/2013   Encounter for screening colonoscopy 08/17/2013   Other constipation 07/10/2013   Chronic pain syndrome 07/10/2013   Bipolar disorder ([place]) 07/10/2013   Other specified hypothyroidism 07/09/2013   Colitis 07/09/2013   Asthma 07/09/2013    Past Surgical History:  Procedure Laterality Date   CATARACT EXTRACTION W/PHACO Right 10/06/2016   Procedure: CATARACT EXTRACTION PHACO AND INTRAOCULAR LENS PLACEMENT ([place]);  Surgeon: Birder Robson, MD;  Location: [facility] ORS;  Service: Ophthalmology;  Laterality: Right;  [place] 00:32.5 AP% 14.5 CDE 4.71 Fluid pack  lot # [id number]   CATARACT EXTRACTION W/PHACO Left 11/03/2016   Procedure: CATARACT EXTRACTION PHACO AND INTRAOCULAR LENS PLACEMENT (IOC);  Surgeon: Birder Robson, MD;  Location: [facility] ORS;  Service: Ophthalmology;  Laterality: Left;  [place] 00:36.6 AP% 16.0 CDE 5.86 Fluid Pack lot # [id number]   COCCYX REMOVAL     colonoscopy  2005   Dr. Laural Golden: mild melanosis coli, otherwise normal   COLONOSCOPY WITH PROPOFOL N/A 09/14/2013   ZJQ:BHALPFXTK coli. Colonic diverticulosis. Single colonic. Tubular adenoma. Next TCS 09/2020.   ERCP  1997   [facility]: biliary manometry  abnormal, subsequent sphincterotomy    ESOPHAGEAL MANOMETRY N/A 12/21/2016   Procedure: ESOPHAGEAL MANOMETRY (EM);  Surgeon: Mauri Pole, MD;  Location: [facility] ENDOSCOPY;  Service: Endoscopy;  Laterality: N/A;   ESOPHAGOGASTRODUODENOSCOPY (EGD) WITH PROPOFOL N/A 09/14/2013   WIO:[id number]'M ring. Hiatal hernia. Status post Venia Minks and biopsy disruption.    FOOT SURGERY     X2   HEMORROIDECTOMY     HERNIA REPAIR     inguinal right   HERNIA REPAIR     MALONEY DILATION N/A 09/14/2013   Procedure: Venia Minks DILATION;  Surgeon: [name], MD;  Location: [facility] ORS;  Service: Endoscopy;  Laterality: N/A;  92   POLYPECTOMY N/A 09/14/2013   Procedure: POLYPECTOMY;  Surgeon: [name], MD;  Location: [facility] ORS;  Service: Endoscopy;  Laterality: N/A;   TONSILLECTOMY       OB History     Gravida  2   Para  2   Term  2   Preterm      AB      Living         SAB      IAB      Ectopic      Multiple      Live Births              Family History  Problem Relation Age of Onset   Lung cancer Mother    Heart attack Father    Bone cancer Brother    COPD Sister    Colon cancer Neg Hx     Social History   Tobacco Use   Smoking status: Never   Smokeless tobacco: Never   Tobacco comments:    NEVER SMOKED  Vaping Use   Vaping Use: Never used  Substance Use Topics   Alcohol use: No   Drug use: No    Home Medications Prior to Admission medications   Medication Sig Start Date End Date Taking? Authorizing Provider  acetaminophen (TYLENOL) 500 MG tablet Take 500 mg by mouth every 6 (six) hours as needed. For pain    [provider]  albuterol (VENTOLIN HFA) 108 (90 Base) MCG/ACT inhaler Inhale 2 puffs into the lungs every 6 (six) hours as needed for wheezing or shortness of breath. For shortness of breath    [provider]  Biotin 10000 MCG TABS Take 1 capsule by mouth daily.    [provider]  Budeson-Glycopyrrol-Formoterol (BREZTRI  AEROSPHERE) 160-9-4.8 MCG/ACT AERO Inhale 2 puffs into the lungs in the morning and at bedtime. 03/18/20   Rigoberto Noel, MD  buPROPion (WELLBUTRIN SR) 200 MG 12 hr tablet Take 200 mg by mouth daily.    [provider]  cetirizine (ZYRTEC) 10 MG tablet Take 10 mg by mouth daily.    [provider]  cholecalciferol (VITAMIN D3) 25 MCG (1000 UNIT) tablet Take 1,000 Units by mouth daily.    [provider]  diltiazem (CARDIZEM) 60 MG tablet Take 60 mg by mouth daily.    [provider]  FLUoxetine (PROZAC) 40 MG capsule Take 40 mg by mouth daily.    [provider]  fluticasone (FLONASE) 50 MCG/ACT nasal spray Place 2 sprays into both nostrils daily.    [provider]  Fluticasone-Salmeterol 113-14 MCG/ACT AEPB 2 puffs daily. 06/30/19   [provider]  furosemide (LASIX) 40 MG tablet Take 40 mg by mouth daily.    [provider]  gabapentin (NEURONTIN) 100 MG capsule Take 100 mg by mouth 3 (three) times daily.  11/17/17   [provider]  ipratropium-albuterol (DUONEB) 0.5-2.5 (3) MG/3ML SOLN Inhale 3 mLs into the lungs every 6 (six) hours as needed. 11/18/17   [provider]  lamoTRIgine (LAMICTAL) 200 MG tablet Take 200 mg by mouth 2 (two) times daily. 06/05/19   [provider]  latanoprost (XALATAN) 0.005 % ophthalmic solution Place 1 drop into both eyes at bedtime. 04/20/13   [provider]  levothyroxine (SYNTHROID, LEVOTHROID) 50 MCG tablet Take 50 mcg by mouth daily.    [provider]  losartan (COZAAR) 50 MG tablet Take 50 mg by mouth daily.    [provider]  meclizine (ANTIVERT) 25 MG tablet Take 25 mg by mouth 3 (three) times daily as needed for dizziness.  08/02/17   [provider]  Melatonin 3 MG CAPS Take 3 mg by mouth at bedtime as needed (for sleep).  11/17/17   [provider]  montelukast (SINGULAIR) 10 MG tablet Take 10 mg by mouth daily.  05/03/13   [provider]  Multiple Vitamins-Minerals (PRESERVISION/LUTEIN PO) Take 1 tablet by mouth 2 (two) times daily.     [provider]  Oxycodone HCl 10 MG TABS Take 10 mg by mouth every 6 (six) hours.    [provider]  polyethylene glycol powder (GLYCOLAX/MIRALAX) 17 GM/SCOOP powder Take 17 g by mouth daily as needed for mild constipation or moderate constipation.  02/16/18   [provider]  potassium chloride SA (KLOR-CON) 20 MEQ tablet Take 2 tablets (40 mEq total) by mouth daily. 01/31/20   Orpah Greek, MD  tiZANidine (ZANAFLEX) 4 MG tablet Take 4 mg by mouth 2 (two) times daily as needed for muscle spasms.    [provider]    Allergies    Abilify [aripiprazole], Geodon [ziprasidone hcl], Trazodone and nefazodone, Quetiapine, and Sulfa antibiotics  Review of Systems   Review of Systems  Constitutional:  Negative for fever.  HENT:  Negative for ear pain.   Eyes:  Negative for pain.  Respiratory:  Negative for cough.   Gastrointestinal:  Negative for abdominal pain.  Genitourinary:  Negative for flank pain.  Musculoskeletal:  Negative for back pain.  Skin:  Negative for rash.  Neurological:  Negative for headaches.   Physical Exam Updated Vital Signs BP (!) 158/65   Pulse 82   Temp 98.4 F (36.9 C) (Oral)   Resp 20   Ht 5' (1.524 m)   Wt 63.5 kg   SpO2 98%   BMI 27.34 kg/m   Physical Exam Constitutional:      General: She is not in acute distress.    Appearance: Normal appearance.  HENT:     Head: Normocephalic.     Nose: Nose normal.  Eyes:     Extraocular Movements: Extraocular movements intact.  Cardiovascular:     Rate and Rhythm: Normal rate.  Pulmonary:  Effort: Pulmonary effort is normal.  Musculoskeletal:     Cervical back: Normal range of motion.     Comments: There is diffuse bruising of the left breast approximately 25 x 25 cm mild tenderness on palpation.  Bilateral knees have mild  bruising as well but no bony crepitus noted and neurovascularly intact.  Neurological:     General: No focal deficit present.     Mental Status: She is alert. Mental status is at baseline.    ED Results / Procedures / Treatments   Labs (all labs ordered are listed, but only abnormal results are displayed) Labs Reviewed  CBC WITH DIFFERENTIAL/PLATELET - Abnormal; Notable for the following components:      Result Value   WBC 14.7 (*)    RBC 3.86 (*)    Neutro Abs 12.1 (*)    Monocytes Absolute 1.2 (*)    All other components within normal limits  COMPREHENSIVE METABOLIC PANEL - Abnormal; Notable for the following components:   Potassium 3.1 (*)    BUN 26 (*)    Calcium 8.5 (*)    Total Protein 6.1 (*)    AST 212 (*)    ALT 178 (*)    Total Bilirubin 1.3 (*)    All other components within normal limits  CK - Abnormal; Notable for the following components:   Total CK 3,113 (*)    All other components within normal limits  TROPONIN I (HIGH SENSITIVITY) - Abnormal; Notable for the following components:   Troponin I (High Sensitivity) 27 (*)    All other components within normal limits  URINE CULTURE  URINALYSIS, ROUTINE W REFLEX MICROSCOPIC  TROPONIN I (HIGH SENSITIVITY)    EKG None  Radiology DG Ribs Unilateral W/Chest Left  Result Date: 08/03/2020 CLINICAL DATA:  Post fall with left-sided chest pain. EXAM: LEFT RIBS AND CHEST - 3+ VIEW COMPARISON:  None. FINDINGS: No fracture or other bone lesions are seen involving the ribs. Multiple overlying monitoring devices in EKG leads partially obscured assessment of lower ribs. There is no evidence of pneumothorax or pleural effusion. Both lungs are clear. Heart size and mediastinal contours are within normal limits. Atherosclerosis of the thoracic aorta. IMPRESSION: Negative radiographs of the chest and left ribs. Electronically Signed   By: Keith Rake M.D.   On: 08/03/2020 17:15   DG Pelvis 1-2 Views  Result Date:  08/03/2020 CLINICAL DATA:  Post fall today. EXAM: PELVIS - 1-2 VIEW COMPARISON:  09/06/2015. FINDINGS: Cortical margins of the bony pelvis are intact. No fracture. Moderate left hip osteoarthritis with joint space narrowing, acetabular and femoral head neck osteophytes. Pubic symphysis and sacroiliac joints are congruent. IMPRESSION: No fracture of the pelvis. Moderate left hip osteoarthritis. Electronically Signed   By: Keith Rake M.D.   On: 08/03/2020 17:16   CT Head Wo Contrast  Result Date: 08/03/2020 CLINICAL DATA:  Fall 3 days prior EXAM: CT HEAD WITHOUT CONTRAST TECHNIQUE: Contiguous axial images were obtained from the base of the skull through the vertex without intravenous contrast. COMPARISON:  CT head and orbits 01/07/2018 FINDINGS: Brain: No evidence of acute infarction, hemorrhage, hydrocephalus, extra-axial collection, visible mass lesion or mass effect. Vascular: No hyperdense vessel or unexpected calcification. Skull: No significant scalp swelling or hematoma. No calvarial fracture. No worrisome osseous lesion. Sinuses/Orbits: Paranasal sinuses and mastoid air cells are predominantly clear. Right-sided nasal septal deviation with a contacting right-sided nasal septal spur. Orbital structures are unremarkable aside from prior lens extractions. Other: None IMPRESSION: No acute  intracranial abnormality. No significant scalp swelling or calvarial fracture. Electronically Signed   By: Lovena Le M.D.   On: 08/03/2020 16:44   DG Knee Complete 4 Views Left  Result Date: 08/03/2020 CLINICAL DATA:  Fall. EXAM: LEFT KNEE - COMPLETE 4+ VIEW COMPARISON:  None. FINDINGS: There is a moderate size suprapatellar joint effusion. No acute fracture or dislocation. Medial and lateral compartment joint space narrowing with marginal spur formation identified. IMPRESSION: 1. Moderate suprapatellar joint effusion.  No fracture identified. 2. Osteoarthritis. Electronically Signed   By: Kerby Moors M.D.   On:  08/03/2020 17:13   DG Knee Complete 4 Views Right  Result Date: 08/03/2020 CLINICAL DATA:  Acute LEFT knee pain following fall today. Initial encounter. EXAM: RIGHT KNEE - COMPLETE 4+ VIEW COMPARISON:  None. FINDINGS: No acute fracture, subluxation or dislocation noted. No joint effusion is present. Joint space narrowing and osteophytosis noted, greatest involving the MEDIAL and LATERAL compartments. No focal bony lesions are present. IMPRESSION: 1. No evidence of acute abnormality. 2. Degenerative changes, greatest involving the MEDIAL and LATERAL compartments. Electronically Signed   By: Margarette Canada M.D.   On: 08/03/2020 17:10    Procedures Procedures   Medications Ordered in ED Medications  sodium chloride 0.9 % bolus 500 mL (0 mLs Intravenous Stopped 08/03/20 1737)  acetaminophen (TYLENOL) tablet 650 mg (650 mg Oral Given 08/03/20 1703)  potassium chloride SA (KLOR-CON) CR tablet 40 mEq (40 mEq Oral Given 08/03/20 1741)  sodium chloride 0.9 % bolus 500 mL (500 mLs Intravenous New Bag/Given 08/03/20 1737)    ED Course  I have reviewed the triage vital signs and the nursing notes.  Pertinent labs & imaging results that were available during my care of the patient were reviewed by me and considered in my medical decision making (see chart for details).  Clinical Course as of 08/03/20 1757  Sat Aug 03, 2020  1724 DG Knee Complete 4 Views Right [JH]    Clinical Course User Index [JH] Almyra Free, Greggory Brandy, MD   MDM Rules/Calculators/A&P                          CK 3100.  Patient given a liter bolus of fluid resuscitation.  White count elevated 14 potassium 3.1 creatinine normal 0.7.  Patient appears unable to care for self at home.  Cannot get herself up from the ground.  She otherwise lives at home alone.  She has an elevated CK level here today elevated white count.  Urinalysis ordered and pending.  Will be brought into the hospitalist team, I suspect she may need acute rehab placement after  admission.  Final Clinical Impression(s) / ED Diagnoses Final diagnoses:  Fall, initial encounter  Non-traumatic rhabdomyolysis  Leukocytosis, unspecified type    Rx / DC Orders ED Discharge Orders     None        Luna Fuse, MD 08/03/20 1757

## 2020-08-03 NOTE — H&P (Signed)
History and Physical  Heidi Fuentes SPQ:[id number] DOB: [birth year] DOA: 08/03/2020  Referring physician: Dr Heidi Fuentes, ED physician PCP: Heidi Flavors, MD  Outpatient Specialists:  Patient Coming From: Home  Chief Complaint: fall, lying on the floor for 2 days  HPI: Heidi Fuentes is a 75 y.o. female with a history of GERD, fibromyalgia, hypertension, GERD, hypothyroidism.  Patient seen for fall that occurred 2 days ago.  She has been lying on the floor since that time, unable to get up.  She reports episodes of falling over the past 6 months.  Her symptoms have been worsening.  She has seen her primary care doctor a few times for this with medication changes.  No palliating or provoking factors.  She describes these episodes as lightheadedness and disequilibrium.  She does not feel presyncopal or vertiginous.  These episodes occur in a variety of different places.  Emergency Department Course: Labs show a CK of greater than 3000.  Creatinine normal.  Xrays normal  Review of Systems:   Pt denies any fevers, chills, nausea, vomiting, diarrhea, constipation, abdominal pain, shortness of breath, dyspnea on exertion, orthopnea, cough, wheezing, palpitations, headache, vision changes, lightheadedness, dizziness, melena, rectal bleeding.  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[facility])    Past Surgical History:  Procedure  Laterality Date   CATARACT EXTRACTION W/PHACO Right 10/06/2016   Procedure: CATARACT EXTRACTION PHACO AND INTRAOCULAR LENS PLACEMENT ([place]);  Surgeon: Heidi Robson, MD;  Location: [facility] ORS;  Service: Ophthalmology;  Laterality: Right;  [place] 00:32.5 AP% 14.5 CDE 4.71 Fluid pack lot # [id number]   CATARACT EXTRACTION W/PHACO Left 11/03/2016   Procedure: CATARACT EXTRACTION PHACO AND INTRAOCULAR LENS PLACEMENT ([facility]);  Surgeon: Heidi Robson, MD;  Location: [facility] ORS;  Service: Ophthalmology;  Laterality: Left;  [place] 00:36.6 AP% 16.0 CDE 5.86 Fluid Pack lot # [id number]   COCCYX REMOVAL     colonoscopy  2005   Dr. Laural Fuentes: mild melanosis coli, otherwise normal   COLONOSCOPY WITH PROPOFOL N/A 09/14/2013   TDS:KAJGOTLXB coli. Colonic diverticulosis. Single colonic. Tubular adenoma. Next TCS 09/2020.   ERCP  1997   [facility]: biliary manometry abnormal, subsequent sphincterotomy    ESOPHAGEAL MANOMETRY N/A 12/21/2016   Procedure: ESOPHAGEAL MANOMETRY (EM);  Surgeon: Heidi Pole, MD;  Location: [facility];  Service: Endoscopy;  Laterality: N/A;   ESOPHAGOGASTRODUODENOSCOPY (EGD) WITH PROPOFOL N/A 09/14/2013   WIO:MBTDHRCB'U ring. Hiatal hernia. Status post Venia Fuentes and biopsy disruption.    FOOT SURGERY     X2   HEMORROIDECTOMY     HERNIA REPAIR     inguinal right   HERNIA REPAIR     MALONEY DILATION N/A 09/14/2013   Procedure: Venia Fuentes DILATION;  Surgeon: Heidi Dolin, MD;  Location: [facility];  Service: Endoscopy;  Laterality: N/A;  8   POLYPECTOMY N/A 09/14/2013   Procedure: POLYPECTOMY;  Surgeon: Heidi Dolin, MD;  Location: [facility]S;  Service: Endoscopy;  Laterality: N/A;   TONSILLECTOMY     Social History:  reports that she has never smoked. She has never used smokeless tobacco. She reports that she does not drink alcohol and does not use drugs. Patient lives at home  Allergies  Allergen Reactions   Abilify [Aripiprazole]     Bad dreams   Geodon [Ziprasidone Hcl]     hospitalization  specifics   Trazodone And Nefazodone Other (See Comments)    Reaction is unknown   Quetiapine Palpitations   Sulfa Antibiotics Rash    Family History  Problem Relation Age of Onset   Lung cancer Mother    Heart attack Father    Bone cancer Brother    COPD Sister    Colon cancer Neg Hx       Prior to Admission medications   Medication Sig Start Date End Date Taking? Authorizing Provider  acetaminophen (TYLENOL) 500 MG tablet Take 500 mg by mouth every 6 (six) hours as needed. For pain    [provider]  albuterol (VENTOLIN HFA) 108 (90 Base) MCG/ACT inhaler Inhale 2 puffs into the lungs every 6 (six) hours as needed for wheezing or shortness of breath. For shortness of breath    [provider]  Biotin 10000 MCG TABS Take 1 capsule by mouth daily.    [provider]  Budeson-Glycopyrrol-Formoterol (BREZTRI AEROSPHERE) 160-9-4.8 MCG/ACT AERO Inhale 2 puffs into the lungs in the morning and at bedtime. 03/18/20   Heidi Noel, MD  buPROPion (WELLBUTRIN SR) 200 MG 12 hr tablet Take 200 mg by mouth daily.    [provider]  cetirizine (ZYRTEC) 10 MG tablet Take 10 mg by mouth daily.    [provider]  cholecalciferol (VITAMIN D3) 25 MCG (1000 UNIT) tablet Take 1,000 Units by mouth daily.    [provider]  diltiazem (CARDIZEM) 60 MG tablet Take 60 mg by mouth daily.    [provider]  FLUoxetine (PROZAC) 40 MG capsule Take 40 mg by mouth daily.    [provider]  fluticasone (FLONASE) 50 MCG/ACT nasal spray Place 2 sprays into both nostrils daily.    [provider]  Fluticasone-Salmeterol 113-14 MCG/ACT AEPB 2 puffs daily. 06/30/19   [provider]  furosemide (LASIX) 40 MG tablet Take 40 mg by mouth daily.    [provider]  gabapentin (NEURONTIN) 100 MG capsule Take 100 mg by mouth 3 (three) times daily.  11/17/17   [provider]  ipratropium-albuterol (DUONEB) 0.5-2.5  (3) MG/3ML SOLN Inhale 3 mLs into the lungs every 6 (six) hours as needed. 11/18/17   [provider]  lamoTRIgine (LAMICTAL) 200 MG tablet Take 200 mg by mouth 2 (two) times daily. 06/05/19   [provider]  latanoprost (XALATAN) 0.005 % ophthalmic solution Place 1 drop into both eyes at bedtime. 04/20/13   [provider]  levothyroxine (SYNTHROID, LEVOTHROID) 50 MCG tablet Take 50 mcg by mouth daily.    [provider]  losartan (COZAAR) 50 MG tablet Take 50 mg by mouth daily.    [provider]  meclizine (ANTIVERT) 25 MG tablet Take 25 mg by mouth 3 (three) times daily as needed for dizziness.  08/02/17   [provider]  Melatonin 3 MG CAPS Take 3 mg by mouth at bedtime  as needed (for sleep).  11/17/17   [provider]  montelukast (SINGULAIR) 10 MG tablet Take 10 mg by mouth daily. 05/03/13   [provider]  Multiple Vitamins-Minerals (PRESERVISION/LUTEIN PO) Take 1 tablet by mouth 2 (two) times daily.     [provider]  Oxycodone HCl 10 MG TABS Take 10 mg by mouth every 6 (six) hours.    [provider]  polyethylene glycol powder (GLYCOLAX/MIRALAX) 17 GM/SCOOP powder Take 17 g by mouth daily as needed for mild constipation or moderate constipation.  02/16/18   [provider]  potassium chloride SA (KLOR-CON) 20 MEQ tablet Take 2 tablets (40 mEq total) by mouth daily. 01/31/20   Orpah Greek, MD  tiZANidine (ZANAFLEX) 4 MG tablet Take 4 mg by mouth 2 (two) times daily as needed for muscle spasms.    [provider]    Physical Exam: BP (!) 162/76   Pulse 87   Temp 98.4 F (36.9 C) (Oral)   Resp 18   Ht 5' (1.524 m)   Wt 63.5 kg   SpO2 100%   BMI 27.34 kg/m   General: Elderly female. Awake and alert and oriented x3. No acute cardiopulmonary distress.  HEENT: Normocephalic atraumatic.  Right and left ears normal in appearance.  Pupils equal, round, reactive to light.  Extraocular muscles are intact. Sclerae anicteric and noninjected.  Moist mucosal membranes. No mucosal lesions.  Neck: Neck supple without lymphadenopathy. No carotid bruits. No masses palpated.  Cardiovascular: Regular rate with normal S1-S2 sounds. No murmurs, rubs, gallops auscultated. No JVD.  Respiratory: Good respiratory effort with no wheezes, rales, rhonchi. Lungs clear to auscultation bilaterally.  No accessory muscle use. Abdomen: Soft, nontender, nondistended. Active bowel sounds. No masses or hepatosplenomegaly  Skin: Large bruise on the left chest.  These are also bruising of the knees bilaterally dry, warm to touch. 2+ dorsalis pedis and radial pulses. Musculoskeletal: No calf or leg pain. All major joints not erythematous nontender.  No upper or lower joint deformation.  Good ROM.  No contractures  Psychiatric: Intact judgment and insight. Pleasant and cooperative. Neurologic: No focal neurological deficits. Strength is 5/5 and symmetric in upper and lower extremities.  Cranial nerves II through XII are grossly intact.           Labs on Admission: I have personally reviewed following labs and imaging studies  CBC: Recent Labs  Lab 08/03/20 1614  WBC 14.7*  NEUTROABS 12.1*  HGB 12.0  HCT 36.6  MCV 94.8  PLT 258   Basic Metabolic Panel: Recent Labs  Lab 08/03/20 1614  NA 137  K 3.1*  CL 101  CO2 22  GLUCOSE 87  BUN 26*  CREATININE 0.70  CALCIUM 8.5*   GFR: Estimated Creatinine Clearance: 50.5 mL/min (by C-G formula based on SCr of 0.7 mg/dL). Liver Function Tests: Recent Labs  Lab 08/03/20 1614  AST 212*  ALT 178*  ALKPHOS 45  BILITOT 1.3*  PROT 6.1*  ALBUMIN 3.5   No results for input(s): LIPASE, AMYLASE in the last 168 hours. No results for input(s): AMMONIA in the last 168 hours. Coagulation Profile: No results for input(s): INR, PROTIME in the last 168 hours. Cardiac Enzymes: Recent Labs  Lab 08/03/20 1614  CKTOTAL 3,113*   BNP (last 3  results) No results for input(s): PROBNP in the last 8760 hours. HbA1C: No results for input(s): HGBA1C in the last 72 hours. CBG: No results for input(s): GLUCAP in the last 168 hours.  Lipid Profile: No results for input(s): CHOL, HDL, LDLCALC, TRIG, CHOLHDL, LDLDIRECT in the last 72 hours. Thyroid Function Tests: No results for input(s): TSH, T4TOTAL, FREET4, T3FREE, THYROIDAB in the last 72 hours. Anemia Panel: No results for input(s): VITAMINB12, FOLATE, FERRITIN, TIBC, IRON, RETICCTPCT in the last 72 hours. Urine analysis:    Component Value Date/Time   COLORURINE YELLOW 01/31/2020 0045   APPEARANCEUR HAZY (A) 01/31/2020 0045   APPEARANCEUR Cloudy 06/02/2012 1042   LABSPEC 1.016 01/31/2020 0045   LABSPEC 1.017 06/02/2012 1042   PHURINE 8.0 01/31/2020 0045   GLUCOSEU NEGATIVE 01/31/2020 0045   GLUCOSEU Negative 06/02/2012 1042   HGBUR NEGATIVE 01/31/2020 0045   BILIRUBINUR NEGATIVE 01/31/2020 0045   BILIRUBINUR Negative 06/02/2012 1042   KETONESUR 20 (A) 01/31/2020 0045   PROTEINUR 30 (A) 01/31/2020 0045   UROBILINOGEN 0.2 12/11/2014 1429   NITRITE NEGATIVE 01/31/2020 0045   LEUKOCYTESUR MODERATE (A) 01/31/2020 0045   LEUKOCYTESUR 3+ 06/02/2012 1042   Sepsis Labs: @LABRCNTIP (procalcitonin:4,lacticidven:4) ) Recent Results (from the past 240 hour(s))  Resp Panel by RT-PCR (Flu A&B, Covid) Nasopharyngeal Swab     Status: None   Collection Time: 08/03/20  6:16 PM   Specimen: Nasopharyngeal Swab; Nasopharyngeal(NP) swabs in vial transport medium  Result Value Ref Range Status   SARS Coronavirus 2 by RT PCR NEGATIVE NEGATIVE Final    Comment: (NOTE) SARS-CoV-2 target nucleic acids are NOT DETECTED.  The SARS-CoV-2 RNA is generally detectable in upper respiratory specimens during the acute phase of infection. The lowest concentration of SARS-CoV-2 viral copies this assay can detect is 138 copies/mL. A negative result does not preclude SARS-Cov-2 infection and should not  be used as the sole basis for treatment or other patient management decisions. A negative result may occur with  improper specimen collection/handling, submission of specimen other than nasopharyngeal swab, presence of viral mutation(s) within the areas targeted by this assay, and inadequate number of viral copies(<138 copies/mL). A negative result must be combined with clinical observations, patient history, and epidemiological information. The expected result is Negative.  Fact Sheet for Patients:  EntrepreneurPulse.com.au  Fact Sheet for Healthcare Providers:  IncredibleEmployment.be  This test is no t yet approved or cleared by the [place] FDA and  has been authorized for detection and/or diagnosis of SARS-CoV-2 by FDA under an Emergency Use Authorization (EUA). This EUA will remain  in effect (meaning this test can be used) for the duration of the COVID-19 declaration under Section 564(b)(1) of the Act, 21 U.S.C.section 360bbb-3(b)(1), unless the authorization is terminated  or revoked sooner.       Influenza A by PCR NEGATIVE NEGATIVE Final   Influenza B by PCR NEGATIVE NEGATIVE Final    Comment: (NOTE) The Xpert Xpress SARS-CoV-2/FLU/RSV plus assay is intended as an aid in the diagnosis of influenza from Nasopharyngeal swab specimens and should not be used as a sole basis for treatment. Nasal washings and aspirates are unacceptable for Xpert Xpress SARS-CoV-2/FLU/RSV testing.  Fact Sheet for Patients: EntrepreneurPulse.com.au  Fact Sheet for Healthcare Providers: IncredibleEmployment.be  This test is not yet approved or cleared by the [place] FDA and has been authorized for detection and/or diagnosis of SARS-CoV-2 by FDA under an Emergency Use Authorization (EUA). This EUA will remain in effect (meaning this test can be used) for the duration of the COVID-19 declaration under Section  564(b)(1) of the Act, 21 U.S.C. section 360bbb-3(b)(1), unless the authorization is terminated or revoked.  Performed at [facility], [address], [place],  [place] [address]      Radiological Exams on Admission: DG Ribs Unilateral W/Chest Left  Result Date: 08/03/2020 CLINICAL DATA:  Post fall with left-sided chest pain. EXAM: LEFT RIBS AND CHEST - 3+ VIEW COMPARISON:  None. FINDINGS: No fracture or other bone lesions are seen involving the ribs. Multiple overlying monitoring devices in EKG leads partially obscured assessment of lower ribs. There is no evidence of pneumothorax or pleural effusion. Both lungs are clear. Heart size and mediastinal contours are within normal limits. Atherosclerosis of the thoracic aorta. IMPRESSION: Negative radiographs of the chest and left ribs. Electronically Signed   By: Keith Rake M.D.   On: 08/03/2020 17:15   DG Pelvis 1-2 Views  Result Date: 08/03/2020 CLINICAL DATA:  Post fall today. EXAM: PELVIS - 1-2 VIEW COMPARISON:  09/06/2015. FINDINGS: Cortical margins of the bony pelvis are intact. No fracture. Moderate left hip osteoarthritis with joint space narrowing, acetabular and femoral head neck osteophytes. Pubic symphysis and sacroiliac joints are congruent. IMPRESSION: No fracture of the pelvis. Moderate left hip osteoarthritis. Electronically Signed   By: Keith Rake M.D.   On: 08/03/2020 17:16   CT Head Wo Contrast  Result Date: 08/03/2020 CLINICAL DATA:  Fall 3 days prior EXAM: CT HEAD WITHOUT CONTRAST TECHNIQUE: Contiguous axial images were obtained from the base of the skull through the vertex without intravenous contrast. COMPARISON:  CT head and orbits 01/07/2018 FINDINGS: Brain: No evidence of acute infarction, hemorrhage, hydrocephalus, extra-axial collection, visible mass lesion or mass effect. Vascular: No hyperdense vessel or unexpected calcification. Skull: No significant scalp swelling or hematoma. No calvarial fracture. No worrisome  osseous lesion. Sinuses/Orbits: Paranasal sinuses and mastoid air cells are predominantly clear. Right-sided nasal septal deviation with a contacting right-sided nasal septal spur. Orbital structures are unremarkable aside from prior lens extractions. Other: None IMPRESSION: No acute intracranial abnormality. No significant scalp swelling or calvarial fracture. Electronically Signed   By: Lovena Le M.D.   On: 08/03/2020 16:44   DG Knee Complete 4 Views Left  Result Date: 08/03/2020 CLINICAL DATA:  Fall. EXAM: LEFT KNEE - COMPLETE 4+ VIEW COMPARISON:  None. FINDINGS: There is a moderate size suprapatellar joint effusion. No acute fracture or dislocation. Medial and lateral compartment joint space narrowing with marginal spur formation identified. IMPRESSION: 1. Moderate suprapatellar joint effusion.  No fracture identified. 2. Osteoarthritis. Electronically Signed   By: Kerby Moors M.D.   On: 08/03/2020 17:13   DG Knee Complete 4 Views Right  Result Date: 08/03/2020 CLINICAL DATA:  Acute LEFT knee pain following fall today. Initial encounter. EXAM: RIGHT KNEE - COMPLETE 4+ VIEW COMPARISON:  None. FINDINGS: No acute fracture, subluxation or dislocation noted. No joint effusion is present. Joint space narrowing and osteophytosis noted, greatest involving the MEDIAL and LATERAL compartments. No focal bony lesions are present. IMPRESSION: 1. No evidence of acute abnormality. 2. Degenerative changes, greatest involving the MEDIAL and LATERAL compartments. Electronically Signed   By: Margarette Canada M.D.   On: 08/03/2020 17:10    EKG: pending  Assessment/Plan: Principal Problem:   Rhabdomyolysis Active Problems:   Other specified hypothyroidism   Bipolar disorder ([place])   Iron deficiency anemia, unspecified   Fall   Essential hypertension   Leukocytosis   Elevated LFTs    This patient was discussed with the ED physician, including pertinent vitals, physical exam findings, labs, and imaging.  We also  discussed care given by the ED provider.  Rhabdomyolysis Admit IV fluids Recheck CK every 6 hours Check  CMP tomorrow Leukocytosis Possibly secondary to rhabdomyolysis No evidence of infection Check CBC tomorrow Fall Consult physical therapy Elevated LFTs CMP tomorrow Hypothyroidism Check TSH Continue levothyroxine Hypertension   DVT prophylaxis: SCDs as the patient is large fall risk Consultants: None Code Status: Full code Family Communication: Son present during interview and exam Disposition Plan: Pending   Truett Mainland, DO

## 2020-08-04 DIAGNOSIS — F319 Bipolar disorder, unspecified: Secondary | ICD-10-CM

## 2020-08-04 DIAGNOSIS — D72829 Elevated white blood cell count, unspecified: Secondary | ICD-10-CM

## 2020-08-04 DIAGNOSIS — M6282 Rhabdomyolysis: Principal | ICD-10-CM

## 2020-08-04 DIAGNOSIS — W19XXXA Unspecified fall, initial encounter: Secondary | ICD-10-CM

## 2020-08-04 DIAGNOSIS — I10 Essential (primary) hypertension: Secondary | ICD-10-CM

## 2020-08-04 LAB — COMPREHENSIVE METABOLIC PANEL
ALT: 136 U/L — ABNORMAL HIGH (ref 0–44)
AST: 136 U/L — ABNORMAL HIGH (ref 15–41)
Albumin: 2.8 g/dL — ABNORMAL LOW (ref 3.5–5.0)
Alkaline Phosphatase: 37 U/L — ABNORMAL LOW (ref 38–126)
Anion gap: 9 (ref 5–15)
BUN: 18 mg/dL (ref 8–23)
CO2: 23 mmol/L (ref 22–32)
Calcium: 8.2 mg/dL — ABNORMAL LOW (ref 8.9–10.3)
Chloride: 106 mmol/L (ref 98–111)
Creatinine, Ser: 0.67 mg/dL (ref 0.44–1.00)
GFR, Estimated: >60 mL/min (ref >60)
Glucose, Bld: 99 mg/dL (ref 70–99)
Potassium: 3.8 mmol/L (ref 3.5–5.1)
Sodium: 138 mmol/L (ref 135–145)
Total Bilirubin: 0.7 mg/dL (ref 0.3–1.2)
Total Protein: 4.7 g/dL — ABNORMAL LOW (ref 6.5–8.1)

## 2020-08-04 LAB — CBC
HCT: 31.6 % — ABNORMAL LOW (ref 36.0–46.0)
Hemoglobin: 10.3 g/dL — ABNORMAL LOW (ref 12.0–15.0)
MCH: 30.9 pg (ref 26.0–34.0)
MCHC: 32.6 g/dL (ref 30.0–36.0)
MCV: 94.9 fL (ref 80.0–100.0)
Platelets: 272 10*3/uL (ref 150–400)
RBC: 3.33 MIL/uL — ABNORMAL LOW (ref 3.87–5.11)
RDW: 14.1 % (ref 11.5–15.5)
WBC: 10.4 10*3/uL (ref 4.0–10.5)
nRBC: 0 % (ref 0.0–0.2)

## 2020-08-04 LAB — URINALYSIS, ROUTINE W REFLEX MICROSCOPIC
Bilirubin Urine: NEGATIVE
Glucose, UA: NEGATIVE mg/dL
Ketones, ur: 20 mg/dL — AB
Nitrite: NEGATIVE
Protein, ur: 30 mg/dL — AB
Specific Gravity, Urine: 1.023 (ref 1.005–1.030)
pH: 6 (ref 5.0–8.0)

## 2020-08-04 LAB — TSH: TSH: 2.893 u[IU]/mL (ref 0.350–4.500)

## 2020-08-04 MED ORDER — ALBUTEROL SULFATE HFA 108 (90 BASE) MCG/ACT IN AERS
2.0000 | INHALATION_SPRAY | RESPIRATORY_TRACT | Status: DC | PRN
  Administered 2020-08-04 – 2020-08-05 (×2): 2 via RESPIRATORY_TRACT
  Filled 2020-08-04: qty 6.7

## 2020-08-04 MED ORDER — SODIUM CHLORIDE 0.9 % IV SOLN
1.0000 g | INTRAVENOUS | Status: DC
  Administered 2020-08-04: 1 g via INTRAVENOUS
  Filled 2020-08-04: qty 10

## 2020-08-04 NOTE — Progress Notes (Signed)
Patient Demographics:    Heidi Fuentes, is a 75 y.o. female, DOB - 28-Feb-1945, CEY:[id number]  Admit date - 08/03/2020   Admitting Physician Truett Mainland, DO  Outpatient Primary MD for the patient is Zhou-Talbert, Elwyn Lade, MD  LOS - 1   Chief Complaint  Patient presents with   Fall        Subjective:    Heidi Fuentes today has no fevers, no emesis,  No chest pain,   -Complains of bilateral knee pain and myalgias  Assessment  & Plan :    Principal Problem:   Rhabdomyolysis Active Problems:   Other specified hypothyroidism   Bipolar disorder ([place])   Fall   Essential hypertension   Leukocytosis   Elevated LFTs  Brief  Summary:-  75 y.o. female with a history of GERD, fibromyalgia, hypertension, GERD, hypothyroidism admitted on 08/03/2020 with Rhabdomyolysis post fall and Elevated LFTs   A/p 1)Rhabdomyolysis--- status post fall, patient was on the floor for at least 2 days after falling at home -CPK 3,113 >> 2,862 Troponin---27>>26 (due to Rhabdo rather than ACS) -Continue IV fluids  2)Recurrent Falls---PTA pt lived alone and did very poorly, patient has significant limitations with mobility related ADLs- this patient needs to continue to be monitored in the hospital until a SNF bed is obtained as she is not safe to go home with her current physcical limitations -Please See #1 above  3)Elevated LFTs in the setting of Rhabdo-- Monitor closely, avoid hepatotoxic agent  Hepatic Function Latest Ref Rng & Units 08/04/2020 08/03/2020 01/30/2020  Total Protein 6.5 - 8.1 g/dL 4.7(L) 6.1(L) 6.9  Albumin 3.5 - 5.0 g/dL 2.8(L) 3.5 4.1  AST 15 - 41 U/L 136(H) 212(H) 29  ALT 0 - 44 U/L 136(H) 178(H) 20  Alk Phosphatase 38 - 126 U/L 37(L) 45 60  Total Bilirubin 0.3 - 1.2 mg/dL 0.7 1.3(H) 0.5  Bilirubin, Direct 0.0 - 0.2 mg/dL - - -   4) hypothyroidism----continue levothyroxine  5) leukocytosis--- ??   Due to UTI versus  reactive due to fall,  6) possible UTI--- UA suspicious for UTI, given Rocephin, pending culture data  7) bipolar disorder--stable, continue Lamictal and Prozac, as well as Wellbutrin   8)HTN--stable continue losartan and Cardizem  9) chronic pain syndrome--- stable, continue Zanaflex and gabapentin  Disposition/Need for in-Hospital Stay- patient unable to be discharged at this time due to status post fall with rhabdomyolysis and UTI requiring IV fluids, and IV antibiotics*  Status is: Inpatient  Remains inpatient appropriate because: Please see disposition above  Disposition: The patient is from: Home              Anticipated d/c is to: SNF              Anticipated d/c date is: 2 days              Patient currently is not medically stable to d/c. Barriers: Not Clinically Stable-   Code Status :  -  Code Status: Full Code   Family Communication:    NA (patient is alert, awake and coherent)   Consults  :  Na  DVT Prophylaxis  :   - SCDs   SCDs Start: 08/03/20 2050    Lab  Results  Component Value Date   PLT 272 08/04/2020    Inpatient Medications  Scheduled Meds:  buPROPion  200 mg Oral Daily   diltiazem  60 mg Oral BID   FLUoxetine  40 mg Oral Daily   gabapentin  100 mg Oral Q8H   lamoTRIgine  200 mg Oral BID   levothyroxine  50 mcg Oral Q0600   losartan  50 mg Oral Daily   oxyCODONE  10 mg Oral Q6H   potassium chloride SA  40 mEq Oral Daily   Continuous Infusions:  lactated ringers 200 mL/hr at 08/03/20 1822   PRN Meds:.polyethylene glycol, tiZANidine    Anti-infectives (From admission, onward)    None         Objective:   Vitals:   08/03/20 2000 08/03/20 2045 08/04/20 0157 08/04/20 0604  BP: (!) 168/72 (!) 148/57 (!) 124/43 (!) 124/50  Pulse: 76 98 67 68  Resp: (!) _0 Temp:  98.2 F (36.8 C) 98.7 F (37.1 C) 98.3 F (36.8 C)  TempSrc:  Oral    SpO2: 98% 100% 99% 95%  Weight:      Height:        Wt Readings  from Last 3 Encounters:  08/03/20 63.5 kg  03/18/20 66.7 kg  01/30/20 69.4 kg     Intake/Output Summary (Last 24 hours) at 08/04/2020 0943 Last data filed at 08/04/2020 0423 Gross per 24 hour  Intake 2240 ml  Output 600 ml  Net 1640 ml   Physical Exam  Gen:- Awake Alert, in no acute distress HEENT:- .AT, No sclera icterus Neck-Supple Neck,No JVD,.  Lungs-  CTAB , fair symmetrical air movement CV- S1, S2 normal, regular  Abd-  +ve B.Sounds, Abd Soft, No tenderness,    Extremity/Skin:- No  edema, pedal pulses present  Psych-affect is appropriate, oriented x3 Neuro-generalized weakness, no new focal deficits, no tremors MSK--bilateral knee area contusion with hemostatic abrasion over the right knee   Data Review:   Micro Results Recent Results (from the past 240 hour(s))  Resp Panel by RT-PCR (Flu A&B, Covid) Nasopharyngeal Swab     Status: None   Collection Time: 08/03/20  6:16 PM   Specimen: Nasopharyngeal Swab; Nasopharyngeal(NP) swabs in vial transport medium  Result Value Ref Range Status   SARS Coronavirus 2 by RT PCR NEGATIVE NEGATIVE Final    Comment: (NOTE) SARS-CoV-2 target nucleic acids are NOT DETECTED.  The SARS-CoV-2 RNA is generally detectable in upper respiratory specimens during the acute phase of infection. The lowest concentration of SARS-CoV-2 viral copies this assay can detect is 138 copies/mL. A negative result does not preclude SARS-Cov-2 infection and should not be used as the sole basis for treatment or other patient management decisions. A negative result may occur with  improper specimen collection/handling, submission of specimen other than nasopharyngeal swab, presence of viral mutation(s) within the areas targeted by this assay, and inadequate number of viral copies(<138 copies/mL). A negative result must be combined with clinical observations, patient history, and epidemiological information. The expected result is Negative.  Fact Sheet  for Patients:  EntrepreneurPulse.com.au  Fact Sheet for Healthcare Providers:  IncredibleEmployment.be  This test is no t yet approved or cleared by the [place] FDA and  has been authorized for detection and/or diagnosis of SARS-CoV-2 by FDA under an Emergency Use Authorization (EUA). This EUA will remain  in effect (meaning this test can be used) for the duration of the COVID-19 declaration under Section 564(b)(1) of  the Act, 21 U.S.C.section 360bbb-3(b)(1), unless the authorization is terminated  or revoked sooner.       Influenza A by PCR NEGATIVE NEGATIVE Final   Influenza B by PCR NEGATIVE NEGATIVE Final    Comment: (NOTE) The Xpert Xpress SARS-CoV-2/FLU/RSV plus assay is intended as an aid in the diagnosis of influenza from Nasopharyngeal swab specimens and should not be used as a sole basis for treatment. Nasal washings and aspirates are unacceptable for Xpert Xpress SARS-CoV-2/FLU/RSV testing.  Fact Sheet for Patients: EntrepreneurPulse.com.au  Fact Sheet for Healthcare Providers: IncredibleEmployment.be  This test is not yet approved or cleared by the [place] FDA and has been authorized for detection and/or diagnosis of SARS-CoV-2 by FDA under an Emergency Use Authorization (EUA). This EUA will remain in effect (meaning this test can be used) for the duration of the COVID-19 declaration under Section 564(b)(1) of the Act, 21 U.S.C. section 360bbb-3(b)(1), unless the authorization is terminated or revoked.  Performed at [facility], [address] [place], [place] [address]     Radiology Reports DG Ribs Unilateral W/Chest Left  Result Date: 08/03/2020 CLINICAL DATA:  Post fall with left-sided chest pain. EXAM: LEFT RIBS AND CHEST - 3+ VIEW COMPARISON:  None. FINDINGS: No fracture or other bone lesions are seen involving the ribs. Multiple overlying monitoring devices in EKG leads  partially obscured assessment of lower ribs. There is no evidence of pneumothorax or pleural effusion. Both lungs are clear. Heart size and mediastinal contours are within normal limits. Atherosclerosis of the thoracic aorta. IMPRESSION: Negative radiographs of the chest and left ribs. Electronically Signed   By: Keith Rake M.D.   On: 08/03/2020 17:15   DG Pelvis 1-2 Views  Result Date: 08/03/2020 CLINICAL DATA:  Post fall today. EXAM: PELVIS - 1-2 VIEW COMPARISON:  09/06/2015. FINDINGS: Cortical margins of the bony pelvis are intact. No fracture. Moderate left hip osteoarthritis with joint space narrowing, acetabular and femoral head neck osteophytes. Pubic symphysis and sacroiliac joints are congruent. IMPRESSION: No fracture of the pelvis. Moderate left hip osteoarthritis. Electronically Signed   By: Keith Rake M.D.   On: 08/03/2020 17:16   CT Head Wo Contrast  Result Date: 08/03/2020 CLINICAL DATA:  Fall 3 days prior EXAM: CT HEAD WITHOUT CONTRAST TECHNIQUE: Contiguous axial images were obtained from the base of the skull through the vertex without intravenous contrast. COMPARISON:  CT head and orbits 01/07/2018 FINDINGS: Brain: No evidence of acute infarction, hemorrhage, hydrocephalus, extra-axial collection, visible mass lesion or mass effect. Vascular: No hyperdense vessel or unexpected calcification. Skull: No significant scalp swelling or hematoma. No calvarial fracture. No worrisome osseous lesion. Sinuses/Orbits: Paranasal sinuses and mastoid air cells are predominantly clear. Right-sided nasal septal deviation with a contacting right-sided nasal septal spur. Orbital structures are unremarkable aside from prior lens extractions. Other: None IMPRESSION: No acute intracranial abnormality. No significant scalp swelling or calvarial fracture. Electronically Signed   By: Lovena Le M.D.   On: 08/03/2020 16:44   DG Knee Complete 4 Views Left  Result Date: 08/03/2020 CLINICAL DATA:  Fall.  EXAM: LEFT KNEE - COMPLETE 4+ VIEW COMPARISON:  None. FINDINGS: There is a moderate size suprapatellar joint effusion. No acute fracture or dislocation. Medial and lateral compartment joint space narrowing with marginal spur formation identified. IMPRESSION: 1. Moderate suprapatellar joint effusion.  No fracture identified. 2. Osteoarthritis. Electronically Signed   By: Kerby Moors M.D.   On: 08/03/2020 17:13   DG Knee Complete 4 Views Right  Result Date:  08/03/2020 CLINICAL DATA:  Acute LEFT knee pain following fall today. Initial encounter. EXAM: RIGHT KNEE - COMPLETE 4+ VIEW COMPARISON:  None. FINDINGS: No acute fracture, subluxation or dislocation noted. No joint effusion is present. Joint space narrowing and osteophytosis noted, greatest involving the MEDIAL and LATERAL compartments. No focal bony lesions are present. IMPRESSION: 1. No evidence of acute abnormality. 2. Degenerative changes, greatest involving the MEDIAL and LATERAL compartments. Electronically Signed   By: Margarette Canada M.D.   On: 08/03/2020 17:10     CBC Recent Labs  Lab 08/03/20 1614 08/04/20 0412  WBC 14.7* 10.4  HGB 12.0 10.3*  HCT 36.6 31.6*  PLT 303 272  MCV 94.8 94.9  MCH 31.1 30.9  MCHC 32.8 32.6  RDW 14.0 14.1  LYMPHSABS 1.3  --   MONOABS 1.2*  --   EOSABS 0.0  --   BASOSABS 0.0  --     Chemistries  Recent Labs  Lab 08/03/20 1614 08/04/20 0412  NA 137 138  K 3.1* 3.8  CL 101 106  CO2 22 23  GLUCOSE 87 99  BUN 26* 18  CREATININE 0.70 0.67  CALCIUM 8.5* 8.2*  AST 212* 136*  ALT 178* 136*  ALKPHOS 45 37*  BILITOT 1.3* 0.7   ------------------------------------------------------------------------------------------------------------------ No results for input(s): CHOL, HDL, LDLCALC, TRIG, CHOLHDL, LDLDIRECT in the last 72 hours.  Lab Results  Component Value Date   HGBA1C 5.7 (H) 07/09/2013    ------------------------------------------------------------------------------------------------------------------ Recent Labs    08/04/20 0412  TSH 2.893   ------------------------------------------------------------------------------------------------------------------ No results for input(s): VITAMINB12, FOLATE, FERRITIN, TIBC, IRON, RETICCTPCT in the last 72 hours.  Coagulation profile No results for input(s): INR, PROTIME in the last 168 hours.  No results for input(s): DDIMER in the last 72 hours.  Cardiac Enzymes No results for input(s): CKMB, TROPONINI, MYOGLOBIN in the last 168 hours.  Invalid input(s): CK ------------------------------------------------------------------------------------------------------------------    Component Value Date/Time   BNP 37.0 01/31/2020 0121     Roxan Hockey M.D on 08/04/2020 at 9:43 AM  Go to [email or web address] - for contact info  [facility] - Office  [phone]

## 2020-08-04 NOTE — Progress Notes (Signed)
Pts daughter In law called asking for an update on pt. This nurse informed her that she wasn't on the chart that I couldn't update her unless I asked the pt first. Daughter in law stated" No don't ask her never mind and hung up"

## 2020-08-05 LAB — COMPREHENSIVE METABOLIC PANEL
ALT: 121 U/L — ABNORMAL HIGH (ref 0–44)
AST: 84 U/L — ABNORMAL HIGH (ref 15–41)
Albumin: 2.8 g/dL — ABNORMAL LOW (ref 3.5–5.0)
Alkaline Phosphatase: 47 U/L (ref 38–126)
Anion gap: 6 (ref 5–15)
BUN: 16 mg/dL (ref 8–23)
CO2: 26 mmol/L (ref 22–32)
Calcium: 8.1 mg/dL — ABNORMAL LOW (ref 8.9–10.3)
Chloride: 101 mmol/L (ref 98–111)
Creatinine, Ser: 0.64 mg/dL (ref 0.44–1.00)
GFR, Estimated: >60 mL/min (ref >60)
Glucose, Bld: 102 mg/dL — ABNORMAL HIGH (ref 70–99)
Potassium: 4.3 mmol/L (ref 3.5–5.1)
Sodium: 133 mmol/L — ABNORMAL LOW (ref 135–145)
Total Bilirubin: 0.7 mg/dL (ref 0.3–1.2)
Total Protein: 4.9 g/dL — ABNORMAL LOW (ref 6.5–8.1)

## 2020-08-05 LAB — URINE CULTURE

## 2020-08-05 LAB — CK: Total CK: 937 U/L — ABNORMAL HIGH (ref 38–234)

## 2020-08-05 MED ORDER — DILTIAZEM HCL ER COATED BEADS 120 MG PO CP24: 120.0000 mg | ORAL_CAPSULE | Freq: Every day | ORAL | 11 refills | Status: DC

## 2020-08-05 MED ORDER — POTASSIUM CHLORIDE CRYS ER 20 MEQ PO TBCR: 20.0000 meq | EXTENDED_RELEASE_TABLET | Freq: Every day | ORAL | 5 refills | Status: DC

## 2020-08-05 NOTE — TOC Transition Note (Signed)
Transition of Care [facility]) - CM/SW Discharge Note   Patient Details  Name: Heidi Fuentes MRN: [id number] Date of Birth: 13-Sep-1945  Transition of [facility] CM/SW Contact:  Natasha Bence, LCSW Phone Number: 08/05/2020, 12:37 PM   Clinical Narrative:    Patient's son agreeable to transport patient. Patient agreeable to [facility] services. Corene Cornea with [facility] agreeable to provide services to patient. TOC signing off.    Final next level of care: [place] Barriers to Discharge: Barriers Resolved   Patient Goals and CMS Choice Patient states their goals for this hospitalization and ongoing recovery are:: Return home with [facility] CMS Medicare.gov Compare Post Acute Care list provided to:: Patient Choice offered to / list presented to : Patient  Discharge Placement                    Patient and family notified of of transfer: 08/05/20  Discharge Plan and Services                          HH Arranged: PT [facility] Agency: [place] ([place]) Date [place]: 08/05/20 Time [place]: 1237 Representative spoke with at [place]: [place] ([place]) Interventions     Readmission Risk Interventions No flowsheet data found.

## 2020-08-05 NOTE — Plan of Care (Signed)
  Problem: Acute Rehab PT Goals(only PT should resolve) Goal: Pt Will Go Supine/Side To Sit Outcome: Progressing Flowsheets (Taken 08/05/2020 0944) Pt will go Supine/Side to Sit: with modified independence Goal: Patient Will Transfer Sit To/From Stand Outcome: Progressing Flowsheets (Taken 08/05/2020 0944) Patient will transfer sit to/from stand:  with min guard assist  with minimal assist Goal: Pt Will Transfer Bed To Chair/Chair To Bed Outcome: Progressing Flowsheets (Taken 08/05/2020 0944) Pt will Transfer Bed to Chair/Chair to Bed:  min guard assist  with min assist Goal: Pt Will Ambulate Outcome: Progressing Flowsheets (Taken 08/05/2020 0944) Pt will Ambulate:  25 feet  with min guard assist  with least restrictive assistive device  9:45 AM, 08/05/20 Mearl Latin PT, DPT Physical Therapist at [place]

## 2020-08-05 NOTE — Discharge Summary (Addendum)
Heidi Fuentes, is a 75 y.o. female  DOB 26-Jun-1945  MRN [id number].  Admission date:  08/03/2020  Admitting Physician  Truett Mainland, DO  Discharge Date:  08/05/2020   Primary MD  Zhou-Talbert, Elwyn Lade, MD  Recommendations for primary care physician for things to follow:   1) drink plenty of fluids ---avoid dehydration 2) repeat CMP/complete metabolic profile blood test with your primary care physician in the week advised 3) you have refused to go to a skilled nursing facility for rehab, a physical therapist to come to your house to help you get stronger   Admission Diagnosis  Rhabdomyolysis [M62.82] Fall, initial encounter [W19.XXXA] Non-traumatic rhabdomyolysis [M62.82] Leukocytosis, unspecified type [D72.829]   Discharge Diagnosis  Rhabdomyolysis [M62.82] Fall, initial encounter [W19.XXXA] Non-traumatic rhabdomyolysis [M62.82] Leukocytosis, unspecified type [D72.829]    Principal Problem:   Rhabdomyolysis Active Problems:   Other specified hypothyroidism   Bipolar disorder ([place])   Fall   Essential hypertension   Leukocytosis   Elevated LFTs      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[facility])     Past Surgical History:  Procedure Laterality Date   CATARACT EXTRACTION W/PHACO Right 10/06/2016   Procedure: CATARACT EXTRACTION PHACO AND INTRAOCULAR LENS PLACEMENT ([place]);   Surgeon: Birder Robson, MD;  Location: [facility] ORS;  Service: Ophthalmology;  Laterality: Right;  [place] 00:32.5 AP% 14.5 CDE 4.71 Fluid pack lot # [id number]   CATARACT EXTRACTION W/PHACO Left 11/03/2016   Procedure: CATARACT EXTRACTION PHACO AND INTRAOCULAR LENS PLACEMENT ([facility]);  Surgeon: Birder Robson, MD;  Location: [facility] ORS;  Service: Ophthalmology;  Laterality: Left;  [place] 00:36.6 AP% 16.0 CDE 5.86 Fluid Pack lot # [id number]   COCCYX REMOVAL     colonoscopy  2005   Dr. Laural Golden: mild melanosis coli, otherwise normal   COLONOSCOPY WITH PROPOFOL N/A 09/14/2013   KXF:GHWEXHBZJ coli. Colonic diverticulosis. Single colonic. Tubular adenoma. Next TCS 09/2020.   ERCP  1997   [facility]: biliary manometry abnormal, subsequent sphincterotomy    ESOPHAGEAL MANOMETRY N/A 12/21/2016   Procedure: ESOPHAGEAL MANOMETRY (EM);  Surgeon: Mauri Pole, MD;  Location: [facility];  Service: Endoscopy;  Laterality: N/A;   ESOPHAGOGASTRODUODENOSCOPY (EGD) WITH PROPOFOL N/A 09/14/2013   IRC:VELFYBOF'B ring. Hiatal hernia. Status post Venia Minks and biopsy disruption.    FOOT SURGERY  X2   HEMORROIDECTOMY     HERNIA REPAIR     inguinal right   HERNIA REPAIR     MALONEY DILATION N/A 09/14/2013   Procedure: Venia Minks DILATION;  Surgeon: [name], MD;  Location: [facility];  Service: Endoscopy;  Laterality: N/A;  17   POLYPECTOMY N/A 09/14/2013   Procedure: POLYPECTOMY;  Surgeon: [name], MD;  Location: [facility] ORS;  Service: Endoscopy;  Laterality: N/A;   TONSILLECTOMY       HPI  from the history and physical done on the day of admission:    Chief Complaint: fall, lying on the floor for 2 days   HPI: Heidi Fuentes is a 75 y.o. female with a history of GERD, fibromyalgia, hypertension, GERD, hypothyroidism.  Patient seen for fall that occurred 2 days ago.  She has been lying on the floor since that time, unable to get up.  She reports episodes of falling over the past 6 months.  Her symptoms have been  worsening.  She has seen her primary care doctor a few times for this with medication changes.  No palliating or provoking factors.  She describes these episodes as lightheadedness and disequilibrium.  She does not feel presyncopal or vertiginous.  These episodes occur in a variety of different places.   Emergency Department Course: Labs show a CK of greater than 3000.  Creatinine normal.  Xrays normal     Brief  Summary:-  75 y.o. female with a history of GERD, fibromyalgia, hypertension, GERD, hypothyroidism admitted on 08/03/2020 with Rhabdomyolysis post fall and Elevated LFTs     A/p 1)Rhabdomyolysis--- status post fall, patient was on the floor for at least 2 days after falling at home -CPK 3,113 >> 2,862>>937 Troponin---27>>26 (due to Rhabdo rather than ACS) Overall much improved with IV fluids, patient advised to drink plenty fluids and avoid dehydration   2) generalized weakness with falls--- physical therapist recommends SNF rehab -Patient declines SNF rehab she wants to go home with home health -Plan of care discussed with patient's son Pilar Plate at bedside -Left voicemail for patient's son Arnette Norris   3)Elevated LFTs in the setting of Rhabdo-- Monitor closely, avoid hepatotoxic agent Hepatic Function Latest Ref Rng & Units 08/05/2020 08/04/2020 08/03/2020  Total Protein 6.5 - 8.1 g/dL 4.9(L) 4.7(L) 6.1(L)  Albumin 3.5 - 5.0 g/dL 2.8(L) 2.8(L) 3.5  AST 15 - 41 U/L 84(H) 136(H) 212(H)  ALT 0 - 44 U/L 121(H) 136(H) 178(H)  Alk Phosphatase 38 - 126 U/L 47 37(L) 45  Total Bilirubin 0.3 - 1.2 mg/dL 0.7 0.7 1.3(H)  Bilirubin, Direct 0.0 - 0.2 mg/dL - - -     5) leukocytosis---   reactive due to fall, WBC normalized, urine culture without growth   6) possible UTI--- UA suspicious for UTI, urine culture without growth okay to discontinue Rocephin  7) bipolar disorder--stable, continue PTA psychiatric medications   8)HTN--stable continue losartan and Cardizem   9) chronic pain  syndrome--- stable, continue Zanaflex and gabapentin   Disposition- physical therapist recommends SNF rehab -Patient declines SNF rehab she wants to go home with home health -Plan of care discussed with patient's son Pilar Plate at bedside -Left voicemail for patient's son Arnette Norris     Disposition: The patient is from: Home               Code Status :  -  Code Status: Full Code    Family Communication:     (patient is alert, awake and coherent) -Discussed  with patient's son Luiz Ochoa at bedside, left voicemail for patient's son Arnette Norris Discharge Condition: stable  Follow UP--- PCP as advised   Consults obtained - na  Diet and Activity recommendation:  As advised  Discharge Instructions   Discharge Instructions     Call MD for:  difficulty breathing, headache or visual disturbances   Complete by: As directed    Call MD for:  persistant dizziness or light-headedness   Complete by: As directed    Call MD for:  persistant nausea and vomiting   Complete by: As directed    Call MD for:  temperature >100.4   Complete by: As directed    Diet - low sodium heart healthy   Complete by: As directed    Discharge instructions   Complete by: As directed    1) drink plenty of fluids ---avoid dehydration 2) repeat CMP/complete metabolic profile blood test with your primary care physician in the week advised 3) you have refused to go to a skilled nursing facility for rehab, a physical therapist to come to your house to help you get stronger   Increase activity slowly   Complete by: As directed          Discharge Medications     Allergies as of 08/05/2020       Reactions   Abilify [aripiprazole] Other (See Comments)   Bad dreams   Geodon [ziprasidone Hcl] Other (See Comments)   hospitalization specifics   Quetiapine Palpitations   Sulfa Antibiotics Rash   Trazodone And Nefazodone Other (See Comments)   Reaction is unknown        Medication List     STOP taking these  medications    diltiazem 30 MG tablet Commonly known as: CARDIZEM       TAKE these medications    acetaminophen 500 MG tablet Commonly known as: TYLENOL Take 500 mg by mouth every 6 (six) hours as needed for mild pain or moderate pain. For pain   albuterol 108 (90 Base) MCG/ACT inhaler Commonly known as: VENTOLIN HFA Inhale 2 puffs into the lungs every 6 (six) hours as needed for wheezing or shortness of breath. For shortness of breath   Biotin 10000 MCG Tabs Take 1 capsule by mouth daily.   Breztri Aerosphere 160-9-4.8 MCG/ACT Aero Generic drug: Budeson-Glycopyrrol-Formoterol Inhale 2 puffs into the lungs in the morning and at bedtime.   buPROPion 200 MG 12 hr tablet Commonly known as: WELLBUTRIN SR Take 200 mg by mouth daily.   cetirizine 10 MG tablet Commonly known as: ZYRTEC Take 10 mg by mouth daily.   cholecalciferol 25 MCG (1000 UNIT) tablet Commonly known as: VITAMIN D3 Take 1,000 Units by mouth daily.   diltiazem 120 MG 24 hr capsule Commonly known as: Cardizem CD Take 1 capsule (120 mg total) by mouth daily.   FLUoxetine 40 MG capsule Commonly known as: PROZAC Take 40 mg by mouth daily.   fluticasone 50 MCG/ACT nasal spray Commonly known as: FLONASE Place 2 sprays into both nostrils daily as needed for allergies.   gabapentin 100 MG capsule Commonly known as: NEURONTIN Take 100 mg by mouth at bedtime.   ipratropium-albuterol 0.5-2.5 (3) MG/3ML Soln Commonly known as: DUONEB Inhale 3 mLs into the lungs every 6 (six) hours as needed (shortness of breath).   lamoTRIgine 200 MG tablet Commonly known as: LAMICTAL Take 200 mg by mouth 2 (two) times daily.   latanoprost 0.005 % ophthalmic solution Commonly known as: XALATAN Place 1 drop into  both eyes at bedtime.   levothyroxine 50 MCG tablet Commonly known as: SYNTHROID Take 50 mcg by mouth daily.   losartan 50 MG tablet Commonly known as: COZAAR Take 50 mg by mouth daily.   meclizine 25 MG  tablet Commonly known as: ANTIVERT Take 25 mg by mouth 3 (three) times daily as needed for dizziness.   Melatonin 3 MG Caps Take 3 mg by mouth at bedtime as needed (for sleep).   montelukast 10 MG tablet Commonly known as: SINGULAIR Take 10 mg by mouth daily.   Oxycodone HCl 10 MG Tabs Take 10 mg by mouth every 6 (six) hours.   polyethylene glycol powder 17 GM/SCOOP powder Commonly known as: GLYCOLAX/MIRALAX Take 17 g by mouth daily as needed for mild constipation or moderate constipation.   potassium chloride SA 20 MEQ tablet Commonly known as: KLOR-CON Take 1 tablet (20 mEq total) by mouth daily.   PRESERVISION/LUTEIN PO Take 1 tablet by mouth 2 (two) times daily.   tiZANidine 4 MG tablet Commonly known as: ZANAFLEX Take 4 mg by mouth 2 (two) times daily as needed for muscle spasms.        Major procedures and Radiology Reports - PLEASE review detailed and final reports for all details, in brief -   DG Ribs Unilateral W/Chest Left  Result Date: 08/03/2020 CLINICAL DATA:  Post fall with left-sided chest pain. EXAM: LEFT RIBS AND CHEST - 3+ VIEW COMPARISON:  None. FINDINGS: No fracture or other bone lesions are seen involving the ribs. Multiple overlying monitoring devices in EKG leads partially obscured assessment of lower ribs. There is no evidence of pneumothorax or pleural effusion. Both lungs are clear. Heart size and mediastinal contours are within normal limits. Atherosclerosis of the thoracic aorta. IMPRESSION: Negative radiographs of the chest and left ribs. Electronically Signed   By: Keith Rake M.D.   On: 08/03/2020 17:15   DG Pelvis 1-2 Views  Result Date: 08/03/2020 CLINICAL DATA:  Post fall today. EXAM: PELVIS - 1-2 VIEW COMPARISON:  09/06/2015. FINDINGS: Cortical margins of the bony pelvis are intact. No fracture. Moderate left hip osteoarthritis with joint space narrowing, acetabular and femoral head neck osteophytes. Pubic symphysis and sacroiliac joints  are congruent. IMPRESSION: No fracture of the pelvis. Moderate left hip osteoarthritis. Electronically Signed   By: Keith Rake M.D.   On: 08/03/2020 17:16   CT Head Wo Contrast  Result Date: 08/03/2020 CLINICAL DATA:  Fall 3 days prior EXAM: CT HEAD WITHOUT CONTRAST TECHNIQUE: Contiguous axial images were obtained from the base of the skull through the vertex without intravenous contrast. COMPARISON:  CT head and orbits 01/07/2018 FINDINGS: Brain: No evidence of acute infarction, hemorrhage, hydrocephalus, extra-axial collection, visible mass lesion or mass effect. Vascular: No hyperdense vessel or unexpected calcification. Skull: No significant scalp swelling or hematoma. No calvarial fracture. No worrisome osseous lesion. Sinuses/Orbits: Paranasal sinuses and mastoid air cells are predominantly clear. Right-sided nasal septal deviation with a contacting right-sided nasal septal spur. Orbital structures are unremarkable aside from prior lens extractions. Other: None IMPRESSION: No acute intracranial abnormality. No significant scalp swelling or calvarial fracture. Electronically Signed   By: Lovena Le M.D.   On: 08/03/2020 16:44   DG Knee Complete 4 Views Left  Result Date: 08/03/2020 CLINICAL DATA:  Fall. EXAM: LEFT KNEE - COMPLETE 4+ VIEW COMPARISON:  None. FINDINGS: There is a moderate size suprapatellar joint effusion. No acute fracture or dislocation. Medial and lateral compartment joint space narrowing with marginal spur formation identified.  IMPRESSION: 1. Moderate suprapatellar joint effusion.  No fracture identified. 2. Osteoarthritis. Electronically Signed   By: Kerby Moors M.D.   On: 08/03/2020 17:13   DG Knee Complete 4 Views Right  Result Date: 08/03/2020 CLINICAL DATA:  Acute LEFT knee pain following fall today. Initial encounter. EXAM: RIGHT KNEE - COMPLETE 4+ VIEW COMPARISON:  None. FINDINGS: No acute fracture, subluxation or dislocation noted. No joint effusion is present.  Joint space narrowing and osteophytosis noted, greatest involving the MEDIAL and LATERAL compartments. No focal bony lesions are present. IMPRESSION: 1. No evidence of acute abnormality. 2. Degenerative changes, greatest involving the MEDIAL and LATERAL compartments. Electronically Signed   By: Margarette Canada M.D.   On: 08/03/2020 17:10    Micro Results   Recent Results (from the past 240 hour(s))  Resp Panel by RT-PCR (Flu A&B, Covid) Nasopharyngeal Swab     Status: None   Collection Time: 08/03/20  6:16 PM   Specimen: Nasopharyngeal Swab; Nasopharyngeal(NP) swabs in vial transport medium  Result Value Ref Range Status   SARS Coronavirus 2 by RT PCR NEGATIVE NEGATIVE Final    Comment: (NOTE) SARS-CoV-2 target nucleic acids are NOT DETECTED.  The SARS-CoV-2 RNA is generally detectable in upper respiratory specimens during the acute phase of infection. The lowest concentration of SARS-CoV-2 viral copies this assay can detect is 138 copies/mL. A negative result does not preclude SARS-Cov-2 infection and should not be used as the sole basis for treatment or other patient management decisions. A negative result may occur with  improper specimen collection/handling, submission of specimen other than nasopharyngeal swab, presence of viral mutation(s) within the areas targeted by this assay, and inadequate number of viral copies(<138 copies/mL). A negative result must be combined with clinical observations, patient history, and epidemiological information. The expected result is Negative.  Fact Sheet for Patients:  EntrepreneurPulse.com.au  Fact Sheet for Healthcare Providers:  IncredibleEmployment.be  This test is no t yet approved or cleared by the [place] FDA and  has been authorized for detection and/or diagnosis of SARS-CoV-2 by FDA under an Emergency Use Authorization (EUA). This EUA will remain  in effect (meaning this test can be used) for the  duration of the COVID-19 declaration under Section 564(b)(1) of the Act, 21 U.S.C.section 360bbb-3(b)(1), unless the authorization is terminated  or revoked sooner.       Influenza A by PCR NEGATIVE NEGATIVE Final   Influenza B by PCR NEGATIVE NEGATIVE Final    Comment: (NOTE) The Xpert Xpress SARS-CoV-2/FLU/RSV plus assay is intended as an aid in the diagnosis of influenza from Nasopharyngeal swab specimens and should not be used as a sole basis for treatment. Nasal washings and aspirates are unacceptable for Xpert Xpress SARS-CoV-2/FLU/RSV testing.  Fact Sheet for Patients: EntrepreneurPulse.com.au  Fact Sheet for Healthcare Providers: IncredibleEmployment.be  This test is not yet approved or cleared by the [place] FDA and has been authorized for detection and/or diagnosis of SARS-CoV-2 by FDA under an Emergency Use Authorization (EUA). This EUA will remain in effect (meaning this test can be used) for the duration of the COVID-19 declaration under Section 564(b)(1) of the Act, 21 U.S.C. section 360bbb-3(b)(1), unless the authorization is terminated or revoked.  Performed at [facility], [address] [place], [place] [address]   Urine culture     Status: Abnormal   Collection Time: 08/04/20  6:29 AM   Specimen: Urine, Clean Catch  Result Value Ref Range Status   Specimen Description   Final  URINE, CLEAN CATCH Performed at [facility], [address], [place], [place] [address]    Special Requests   Final    NONE Performed at [facility], [address] [place], [place] [address]    Culture MULTIPLE SPECIES PRESENT, SUGGEST RECOLLECTION (A)  Final   Report Status 08/05/2020 FINAL  Final       Today   Subjective    Heidi Fuentes today has no new complaints No fever  Or chills   No Nausea, Vomiting or Diarrhea  -physical therapist recommends SNF rehab -Patient declines SNF rehab she wants to go home with home  health -Plan of care discussed with patient's son Pilar Plate at bedside -Left voicemail for patient's son Arnette Norris       Patient has been seen and examined prior to discharge   Objective   Blood pressure 105/80, pulse 75, temperature 97.9 F (36.6 C), resp. rate 18, height 5' (1.524 m), weight 63.5 kg, SpO2 93 %.   Intake/Output Summary (Last 24 hours) at 08/05/2020 1016 Last data filed at 08/05/2020 0900 Gross per 24 hour  Intake 2246.64 ml  Output --  Net 2246.64 ml    Exam Gen:- Awake Alert, in no acute distress HEENT:- [place].AT, No sclera icterus, very HOH Neck-Supple Neck,No JVD,. Lungs-  CTAB , fair symmetrical air movement CV- S1, S2 normal, regular Abd-  +ve B.Sounds, Abd Soft, No tenderness,    Extremity/Skin:- No  edema, pedal pulses present Psych-affect is appropriate, oriented x3 Neuro-generalized weakness, no new focal deficits, no tremors MSK--bilateral knee area contusion with hemostatic abrasion over the right knee   Data Review   CBC w Diff:  Lab Results  Component Value Date   WBC 10.4 08/04/2020   HGB 10.3 (L) 08/04/2020   HGB 14.5 06/01/2012   HCT 31.6 (L) 08/04/2020   HCT 42.4 06/01/2012   PLT 272 08/04/2020   PLT 329 06/01/2012   LYMPHOPCT 9 08/03/2020   LYMPHOPCT 31.5 06/01/2012   MONOPCT 8 08/03/2020   MONOPCT 8.9 06/01/2012   EOSPCT 0 08/03/2020   EOSPCT 2.3 06/01/2012   BASOPCT 0 08/03/2020   BASOPCT 0.6 06/01/2012    CMP:  Lab Results  Component Value Date   NA 133 (L) 08/05/2020   NA 137 06/01/2012   K 4.3 08/05/2020   K 3.6 06/01/2012   CL 101 08/05/2020   CL 102 06/01/2012   CO2 26 08/05/2020   CO2 24 06/01/2012   BUN 16 08/05/2020   BUN 28 (H) 06/01/2012   CREATININE 0.64 08/05/2020   CREATININE 1.38 (H) 06/01/2012   PROT 4.9 (L) 08/05/2020   PROT 8.2 06/01/2012   ALBUMIN 2.8 (L) 08/05/2020   ALBUMIN 4.3 06/01/2012   BILITOT 0.7 08/05/2020   BILITOT 0.5 06/01/2012   ALKPHOS 47 08/05/2020   ALKPHOS 70 06/01/2012   AST 84 (H)  08/05/2020   AST 53 (H) 06/01/2012   ALT 121 (H) 08/05/2020   ALT 49 06/01/2012  .   Total Discharge time is about 33 minutes  Roxan Hockey M.D on 08/05/2020 at 10:16 AM  Go to [email or web address] -  for contact info  [facility] - Office  [phone]

## 2020-08-05 NOTE — Evaluation (Signed)
Physical Therapy Evaluation Patient Details Name: Heidi Fuentes MRN: [id number] DOB: [birth year] Today's Date: 08/05/2020   History of Present Illness  Heidi Fuentes is a 75 y.o. female with a history of GERD, fibromyalgia, hypertension, GERD, hypothyroidism.  Patient seen for fall that occurred 2 days ago.  She has been lying on the floor since that time, unable to get up.  She reports episodes of falling over the past 6 months.  Her symptoms have been worsening.  She has seen her primary care doctor a few times for this with medication changes.  No palliating or provoking factors.  She describes these episodes as lightheadedness and disequilibrium.  She does not feel presyncopal or vertiginous.  These episodes occur in a variety of different places.   Clinical Impression  Patient limited for functional mobility as stated below secondary to BLE weakness, fatigue and impaired standing balance. Patient completes bed mobility with slow, labored movements requiring extra time to complete. Patient requires RW and assist to transfer to standing secondary to weakness and c/o knee pain. Patient ambulates with slow cadence in room with RW without loss of balance. Patient returned to bed at end of session. Patient will benefit from continued physical therapy in hospital and recommended venue below to increase strength, balance, endurance for safe ADLs and gait.     Follow Up Recommendations SNF    Equipment Recommendations  None recommended by PT    Recommendations for Other Services       Precautions / Restrictions Precautions Precautions: Fall Restrictions Weight Bearing Restrictions: No      Mobility  Bed Mobility Overal bed mobility: Needs Assistance Bed Mobility: Supine to Sit;Sit to Supine     Supine to sit: Min guard;HOB elevated Sit to supine: Supervision   General bed mobility comments: slow, labored movements requiring increased time to complete    Transfers Overall transfer  level: Needs assistance Equipment used: Rolling walker (2 wheeled) Transfers: Sit to/from [facility] Sit to Stand: Mod assist Stand pivot transfers: Min assist       General transfer comment: labored transition to standing with RW, cueing for sequencing  Ambulation/Gait Ambulation/Gait assistance: Min assist Gait Distance (Feet): 10 Feet Assistive device: Rolling walker (2 wheeled) Gait Pattern/deviations: Decreased stride length Gait velocity: decreased   General Gait Details: slow cadence with RW  Stairs            Wheelchair Mobility    Modified Rankin (Stroke Patients Only)       Balance Overall balance assessment: Needs assistance Sitting-balance support: Feet supported Sitting balance-Leahy Scale: Good Sitting balance - Comments: seated EOB   Standing balance support: Bilateral upper extremity supported Standing balance-Leahy Scale: Fair Standing balance comment: with RW                             Pertinent Vitals/Pain Pain Assessment: No/denies pain    Home Living Family/patient expects to be discharged to:: Private residence Living Arrangements: Alone Available Help at Discharge: Family;Available PRN/intermittently Type of Home: House Home Access: Ramped entrance     Home Layout: One level Home Equipment: Walker - 2 wheels;Cane - single point;Shower seat      Prior Function Level of Independence: Needs assistance   Gait / Transfers Assistance Needed: short distance community ambulator with [facility]  ADL's / Homemaking Assistance Needed: independent with basic ADL        Hand Dominance  Extremity/Trunk Assessment   Upper Extremity Assessment Upper Extremity Assessment: Overall WFL for tasks assessed    Lower Extremity Assessment Lower Extremity Assessment: Generalized weakness    Cervical / Trunk Assessment Cervical / Trunk Assessment: Normal  Communication   Communication: No difficulties   Cognition Arousal/Alertness: Awake/alert Behavior During Therapy: WFL for tasks assessed/performed Overall Cognitive Status: Within Functional Limits for tasks assessed                                        General Comments      Exercises     Assessment/Plan    PT Assessment Patient needs continued PT services  PT Problem List Decreased strength;Decreased mobility;Decreased activity tolerance;Decreased balance;Decreased knowledge of use of DME       PT Treatment Interventions DME instruction;Therapeutic exercise;Gait training;Balance training;Stair training;Neuromuscular re-education;Functional mobility training;Therapeutic activities;Patient/family education    PT Goals (Current goals can be found in the Care Plan section)  Acute Rehab PT Goals Patient Stated Goal: Get stronger and go home PT Goal Formulation: With patient Time For Goal Achievement: 08/19/20 Potential to Achieve Goals: Fair    Frequency Min 3X/week   Barriers to discharge        Co-evaluation               AM-PAC PT "6 Clicks" Mobility  Outcome Measure Help needed turning from your back to your side while in a flat bed without using bedrails?: None Help needed moving from lying on your back to sitting on the side of a flat bed without using bedrails?: A Little Help needed moving to and from a bed to a chair (including a wheelchair)?: A Lot Help needed standing up from a chair using your arms (e.g., wheelchair or bedside chair)?: A Lot Help needed to walk in hospital room?: A Little Help needed climbing 3-5 steps with a railing? : A Lot 6 Click Score: 16    End of Session Equipment Utilized During Treatment: Gait belt Activity Tolerance: Patient tolerated treatment well;Patient limited by fatigue Patient left: in bed;with call bell/phone within reach;with bed alarm set Nurse Communication: Mobility status PT Visit Diagnosis: Unsteadiness on feet (R26.81);Other abnormalities  of gait and mobility (R26.89);Muscle weakness (generalized) (M62.81);History of falling (Z91.81)    Time: 1610-9604 PT Time Calculation (min) (ACUTE ONLY): 31 min   Charges:   PT Evaluation $PT Eval Low Complexity: 1 Low PT Treatments $Therapeutic Activity: 23-37 mins        9:43 AM, 08/05/20 Mearl Latin PT, DPT Physical Therapist at [facility]

## 2020-08-06 ENCOUNTER — Other Ambulatory Visit: Payer: Self-pay

## 2020-08-06 ENCOUNTER — Inpatient Hospital Stay
Admission: EM | Admit: 2020-08-06 | Discharge: 2020-08-13 | DRG: 564 | Disposition: A | Discharge status: Skilled Nursing Facility | Payer: Medicare Other | Attending: Family Medicine | Admitting: Family Medicine

## 2020-08-06 DIAGNOSIS — R296 Repeated falls: Secondary | ICD-10-CM | POA: Diagnosis present

## 2020-08-06 DIAGNOSIS — R7989 Other specified abnormal findings of blood chemistry: Secondary | ICD-10-CM

## 2020-08-06 DIAGNOSIS — Z9842 Cataract extraction status, left eye: Secondary | ICD-10-CM | POA: Diagnosis not present

## 2020-08-06 DIAGNOSIS — Z20822 Contact with and (suspected) exposure to covid-19: Secondary | ICD-10-CM | POA: Diagnosis present

## 2020-08-06 DIAGNOSIS — Z888 Allergy status to other drugs, medicaments and biological substances status: Secondary | ICD-10-CM

## 2020-08-06 DIAGNOSIS — Z961 Presence of intraocular lens: Secondary | ICD-10-CM | POA: Diagnosis present

## 2020-08-06 DIAGNOSIS — F1120 Opioid dependence, uncomplicated: Secondary | ICD-10-CM | POA: Diagnosis present

## 2020-08-06 DIAGNOSIS — J45909 Unspecified asthma, uncomplicated: Secondary | ICD-10-CM | POA: Diagnosis present

## 2020-08-06 DIAGNOSIS — Z9841 Cataract extraction status, right eye: Secondary | ICD-10-CM | POA: Diagnosis not present

## 2020-08-06 DIAGNOSIS — E039 Hypothyroidism, unspecified: Secondary | ICD-10-CM | POA: Diagnosis present

## 2020-08-06 DIAGNOSIS — K449 Diaphragmatic hernia without obstruction or gangrene: Secondary | ICD-10-CM | POA: Diagnosis present

## 2020-08-06 DIAGNOSIS — Z9114 Patient's other noncompliance with medication regimen: Secondary | ICD-10-CM

## 2020-08-06 DIAGNOSIS — I10 Essential (primary) hypertension: Secondary | ICD-10-CM | POA: Diagnosis present

## 2020-08-06 DIAGNOSIS — R300 Dysuria: Secondary | ICD-10-CM | POA: Diagnosis not present

## 2020-08-06 DIAGNOSIS — E876 Hypokalemia: Secondary | ICD-10-CM | POA: Diagnosis present

## 2020-08-06 DIAGNOSIS — G250 Essential tremor: Secondary | ICD-10-CM | POA: Diagnosis present

## 2020-08-06 DIAGNOSIS — K5903 Drug induced constipation: Secondary | ICD-10-CM | POA: Diagnosis present

## 2020-08-06 DIAGNOSIS — G894 Chronic pain syndrome: Secondary | ICD-10-CM | POA: Diagnosis present

## 2020-08-06 DIAGNOSIS — X58XXXA Exposure to other specified factors, initial encounter: Secondary | ICD-10-CM | POA: Diagnosis present

## 2020-08-06 DIAGNOSIS — G928 Other toxic encephalopathy: Secondary | ICD-10-CM | POA: Diagnosis present

## 2020-08-06 DIAGNOSIS — T40605A Adverse effect of unspecified narcotics, initial encounter: Secondary | ICD-10-CM | POA: Diagnosis present

## 2020-08-06 DIAGNOSIS — R627 Adult failure to thrive: Secondary | ICD-10-CM | POA: Diagnosis present

## 2020-08-06 DIAGNOSIS — E860 Dehydration: Secondary | ICD-10-CM | POA: Diagnosis present

## 2020-08-06 DIAGNOSIS — Z79899 Other long term (current) drug therapy: Secondary | ICD-10-CM

## 2020-08-06 DIAGNOSIS — D649 Anemia, unspecified: Secondary | ICD-10-CM | POA: Diagnosis present

## 2020-08-06 DIAGNOSIS — Y929 Unspecified place or not applicable: Secondary | ICD-10-CM | POA: Diagnosis not present

## 2020-08-06 DIAGNOSIS — S2002XA Contusion of left breast, initial encounter: Secondary | ICD-10-CM | POA: Diagnosis present

## 2020-08-06 DIAGNOSIS — M797 Fibromyalgia: Secondary | ICD-10-CM | POA: Diagnosis present

## 2020-08-06 DIAGNOSIS — F3131 Bipolar disorder, current episode depressed, mild: Secondary | ICD-10-CM | POA: Diagnosis not present

## 2020-08-06 DIAGNOSIS — Z79890 Hormone replacement therapy (postmenopausal): Secondary | ICD-10-CM

## 2020-08-06 DIAGNOSIS — K219 Gastro-esophageal reflux disease without esophagitis: Secondary | ICD-10-CM | POA: Diagnosis present

## 2020-08-06 DIAGNOSIS — W19XXXA Unspecified fall, initial encounter: Secondary | ICD-10-CM | POA: Diagnosis present

## 2020-08-06 DIAGNOSIS — Z825 Family history of asthma and other chronic lower respiratory diseases: Secondary | ICD-10-CM

## 2020-08-06 DIAGNOSIS — F3130 Bipolar disorder, current episode depressed, mild or moderate severity, unspecified: Secondary | ICD-10-CM | POA: Diagnosis present

## 2020-08-06 DIAGNOSIS — T796XXA Traumatic ischemia of muscle, initial encounter: Principal | ICD-10-CM

## 2020-08-06 DIAGNOSIS — Z882 Allergy status to sulfonamides status: Secondary | ICD-10-CM

## 2020-08-06 DIAGNOSIS — T796XXD Traumatic ischemia of muscle, subsequent encounter: Secondary | ICD-10-CM

## 2020-08-06 DIAGNOSIS — Z8249 Family history of ischemic heart disease and other diseases of the circulatory system: Secondary | ICD-10-CM

## 2020-08-06 DIAGNOSIS — M6282 Rhabdomyolysis: Secondary | ICD-10-CM | POA: Diagnosis present

## 2020-08-06 DIAGNOSIS — F319 Bipolar disorder, unspecified: Secondary | ICD-10-CM | POA: Diagnosis present

## 2020-08-06 LAB — COMPREHENSIVE METABOLIC PANEL
ALT: 175 U/L — ABNORMAL HIGH (ref 0–44)
AST: 222 U/L — ABNORMAL HIGH (ref 15–41)
Albumin: 3.7 g/dL (ref 3.5–5.0)
Alkaline Phosphatase: 56 U/L (ref 38–126)
Anion gap: 13 (ref 5–15)
BUN: 14 mg/dL (ref 8–23)
CO2: 30 mmol/L (ref 22–32)
Calcium: 9 mg/dL (ref 8.9–10.3)
Chloride: 95 mmol/L — ABNORMAL LOW (ref 98–111)
Creatinine, Ser: 0.63 mg/dL (ref 0.44–1.00)
GFR, Estimated: >60 mL/min (ref >60)
Glucose, Bld: 85 mg/dL (ref 70–99)
Potassium: 3.4 mmol/L — ABNORMAL LOW (ref 3.5–5.1)
Sodium: 138 mmol/L (ref 135–145)
Total Bilirubin: 1.7 mg/dL — ABNORMAL HIGH (ref 0.3–1.2)
Total Protein: 6.3 g/dL — ABNORMAL LOW (ref 6.5–8.1)

## 2020-08-06 LAB — CBC
HCT: 39.6 % (ref 36.0–46.0)
Hemoglobin: 13.4 g/dL (ref 12.0–15.0)
MCH: 30.9 pg (ref 26.0–34.0)
MCHC: 33.8 g/dL (ref 30.0–36.0)
MCV: 91.5 fL (ref 80.0–100.0)
Platelets: 373 10*3/uL (ref 150–400)
RBC: 4.33 MIL/uL (ref 3.87–5.11)
RDW: 14.2 % (ref 11.5–15.5)
WBC: 13.6 10*3/uL — ABNORMAL HIGH (ref 4.0–10.5)
nRBC: 0 % (ref 0.0–0.2)

## 2020-08-06 LAB — CK: Total CK: 4384 U/L — ABNORMAL HIGH (ref 38–234)

## 2020-08-06 LAB — ACETAMINOPHEN LEVEL: Acetaminophen (Tylenol), Serum: <10 ug/mL — ABNORMAL LOW (ref 10–30)

## 2020-08-06 LAB — SALICYLATE LEVEL: Salicylate Lvl: <7 mg/dL — ABNORMAL LOW (ref 7.0–30.0)

## 2020-08-06 LAB — ETHANOL: Alcohol, Ethyl (B): <10 mg/dL (ref <10)

## 2020-08-06 MED ORDER — ONDANSETRON HCL 4 MG PO TABS: 4.0000 mg | ORAL_TABLET | Freq: Four times a day (QID) | ORAL | Status: DC | PRN

## 2020-08-06 MED ORDER — ACETAMINOPHEN 650 MG RE SUPP: 650.0000 mg | Freq: Four times a day (QID) | RECTAL | Status: DC | PRN

## 2020-08-06 MED ORDER — MECLIZINE HCL 25 MG PO TABS
25.0000 mg | ORAL_TABLET | Freq: Three times a day (TID) | ORAL | Status: DC | PRN
  Filled 2020-08-06: qty 1

## 2020-08-06 MED ORDER — LAMOTRIGINE 100 MG PO TABS
200.0000 mg | ORAL_TABLET | Freq: Two times a day (BID) | ORAL | Status: DC
  Administered 2020-08-06 – 2020-08-13 (×14): 200 mg via ORAL
  Filled 2020-08-06 (×5): qty 2
  Filled 2020-08-06: qty 8
  Filled 2020-08-06: qty 2
  Filled 2020-08-06: qty 8
  Filled 2020-08-06 (×3): qty 2
  Filled 2020-08-06 (×2): qty 8
  Filled 2020-08-06 (×4): qty 2
  Filled 2020-08-06 (×4): qty 8
  Filled 2020-08-06: qty 2

## 2020-08-06 MED ORDER — SODIUM CHLORIDE 0.9 % IV BOLUS
1000.0000 mL | Freq: Once | INTRAVENOUS | Status: AC
  Administered 2020-08-06: 1000 mL via INTRAVENOUS

## 2020-08-06 MED ORDER — VITAMIN D 25 MCG (1000 UNIT) PO TABS
1000.0000 [IU] | ORAL_TABLET | Freq: Every day | ORAL | Status: DC
  Administered 2020-08-06 – 2020-08-13 (×8): 1000 [IU] via ORAL
  Filled 2020-08-06 (×8): qty 1

## 2020-08-06 MED ORDER — MAGNESIUM HYDROXIDE 400 MG/5ML PO SUSP: 30.0000 mL | Freq: Every day | ORAL | Status: DC | PRN

## 2020-08-06 MED ORDER — SODIUM CHLORIDE 0.9 % IV SOLN: INTRAVENOUS | Status: AC

## 2020-08-06 MED ORDER — BUPROPION HCL ER (SR) 100 MG PO TB12
200.0000 mg | ORAL_TABLET | Freq: Two times a day (BID) | ORAL | Status: DC
  Administered 2020-08-06 – 2020-08-09 (×6): 200 mg via ORAL
  Filled 2020-08-06 (×9): qty 2

## 2020-08-06 MED ORDER — FLUOXETINE HCL 20 MG PO CAPS
40.0000 mg | ORAL_CAPSULE | Freq: Every day | ORAL | Status: DC
  Administered 2020-08-07 – 2020-08-13 (×7): 40 mg via ORAL
  Filled 2020-08-06 (×7): qty 2

## 2020-08-06 MED ORDER — GABAPENTIN 100 MG PO CAPS
100.0000 mg | ORAL_CAPSULE | Freq: Every day | ORAL | Status: DC
  Administered 2020-08-06 – 2020-08-09 (×4): 100 mg via ORAL
  Filled 2020-08-06 (×4): qty 1

## 2020-08-06 MED ORDER — OCUVITE-LUTEIN PO CAPS
1.0000 | ORAL_CAPSULE | Freq: Two times a day (BID) | ORAL | Status: DC
  Administered 2020-08-07 – 2020-08-13 (×13): 1 via ORAL
  Filled 2020-08-06 (×15): qty 1

## 2020-08-06 MED ORDER — POLYETHYLENE GLYCOL 3350 17 G PO PACK: 17.0000 g | PACK | Freq: Every day | ORAL | Status: DC | PRN

## 2020-08-06 MED ORDER — ONDANSETRON HCL 4 MG/2ML IJ SOLN: 4.0000 mg | Freq: Four times a day (QID) | INTRAMUSCULAR | Status: DC | PRN

## 2020-08-06 MED ORDER — ZOLPIDEM TARTRATE 5 MG PO TABS
5.0000 mg | ORAL_TABLET | Freq: Every evening | ORAL | Status: DC | PRN
  Administered 2020-08-07: 5 mg via ORAL
  Filled 2020-08-06: qty 1

## 2020-08-06 MED ORDER — DILTIAZEM HCL ER COATED BEADS 120 MG PO CP24
120.0000 mg | ORAL_CAPSULE | Freq: Every day | ORAL | Status: DC
  Administered 2020-08-07 – 2020-08-12 (×6): 120 mg via ORAL
  Filled 2020-08-06 (×7): qty 1

## 2020-08-06 MED ORDER — MONTELUKAST SODIUM 10 MG PO TABS
10.0000 mg | ORAL_TABLET | Freq: Every day | ORAL | Status: DC
  Administered 2020-08-07 – 2020-08-13 (×8): 10 mg via ORAL
  Filled 2020-08-06 (×9): qty 1

## 2020-08-06 MED ORDER — FLUTICASONE PROPIONATE 50 MCG/ACT NA SUSP
2.0000 | Freq: Every day | NASAL | Status: DC | PRN
  Filled 2020-08-06 (×2): qty 16

## 2020-08-06 MED ORDER — POTASSIUM CHLORIDE CRYS ER 20 MEQ PO TBCR
20.0000 meq | EXTENDED_RELEASE_TABLET | Freq: Every day | ORAL | Status: DC
  Administered 2020-08-06: 20 meq via ORAL
  Filled 2020-08-06 (×2): qty 1

## 2020-08-06 MED ORDER — ENOXAPARIN SODIUM 40 MG/0.4ML IJ SOSY
40.0000 mg | PREFILLED_SYRINGE | INTRAMUSCULAR | Status: DC
  Administered 2020-08-07 – 2020-08-12 (×7): 40 mg via SUBCUTANEOUS
  Filled 2020-08-06 (×7): qty 0.4

## 2020-08-06 MED ORDER — ACETAMINOPHEN 325 MG PO TABS
650.0000 mg | ORAL_TABLET | Freq: Four times a day (QID) | ORAL | Status: DC | PRN
  Administered 2020-08-08: 650 mg via ORAL
  Filled 2020-08-06: qty 2

## 2020-08-06 MED ORDER — LORATADINE 10 MG PO TABS
10.0000 mg | ORAL_TABLET | Freq: Every day | ORAL | Status: DC
  Administered 2020-08-07 – 2020-08-13 (×7): 10 mg via ORAL
  Filled 2020-08-06 (×7): qty 1

## 2020-08-06 MED ORDER — IPRATROPIUM-ALBUTEROL 0.5-2.5 (3) MG/3ML IN SOLN
3.0000 mL | Freq: Four times a day (QID) | RESPIRATORY_TRACT | Status: DC | PRN
  Administered 2020-08-08: 3 mL via RESPIRATORY_TRACT
  Filled 2020-08-06: qty 3

## 2020-08-06 MED ORDER — ALBUTEROL SULFATE (2.5 MG/3ML) 0.083% IN NEBU: 2.5000 mg | INHALATION_SOLUTION | Freq: Four times a day (QID) | RESPIRATORY_TRACT | Status: DC | PRN

## 2020-08-06 MED ORDER — LATANOPROST 0.005 % OP SOLN
1.0000 [drp] | Freq: Every day | OPHTHALMIC | Status: DC
  Administered 2020-08-07 – 2020-08-12 (×7): 1 [drp] via OPHTHALMIC
  Filled 2020-08-06: qty 2.5

## 2020-08-06 MED ORDER — MELATONIN 5 MG PO TABS: 5.0000 mg | ORAL_TABLET | Freq: Every evening | ORAL | Status: DC | PRN

## 2020-08-06 MED ORDER — LOSARTAN POTASSIUM 50 MG PO TABS
50.0000 mg | ORAL_TABLET | Freq: Every day | ORAL | Status: DC
  Administered 2020-08-06 – 2020-08-12 (×7): 50 mg via ORAL
  Filled 2020-08-06 (×7): qty 1

## 2020-08-06 MED ORDER — UMECLIDINIUM BROMIDE 62.5 MCG/INH IN AEPB
1.0000 | INHALATION_SPRAY | Freq: Every day | RESPIRATORY_TRACT | Status: DC
  Administered 2020-08-08 – 2020-08-13 (×6): 1 via RESPIRATORY_TRACT
  Filled 2020-08-06 (×2): qty 7

## 2020-08-06 MED ORDER — LEVOTHYROXINE SODIUM 50 MCG PO TABS
50.0000 ug | ORAL_TABLET | Freq: Every day | ORAL | Status: DC
  Administered 2020-08-07 – 2020-08-13 (×7): 50 ug via ORAL
  Filled 2020-08-06 (×8): qty 1

## 2020-08-06 MED ORDER — MOMETASONE FURO-FORMOTEROL FUM 200-5 MCG/ACT IN AERO
2.0000 | INHALATION_SPRAY | Freq: Two times a day (BID) | RESPIRATORY_TRACT | Status: DC
  Administered 2020-08-07 – 2020-08-13 (×13): 2 via RESPIRATORY_TRACT
  Filled 2020-08-06 (×2): qty 8.8

## 2020-08-06 MED ORDER — OXYCODONE HCL 5 MG PO TABS
10.0000 mg | ORAL_TABLET | Freq: Four times a day (QID) | ORAL | Status: DC | PRN
  Administered 2020-08-07 – 2020-08-08 (×5): 10 mg via ORAL
  Filled 2020-08-06 (×5): qty 2

## 2020-08-06 MED ORDER — BUDESON-GLYCOPYRROL-FORMOTEROL 160-9-4.8 MCG/ACT IN AERO: 2.0000 | INHALATION_SPRAY | Freq: Two times a day (BID) | RESPIRATORY_TRACT | Status: DC

## 2020-08-06 NOTE — ED Triage Notes (Addendum)
Arrives to ED via [facility] under IVC due to failure to thrive. Pt recently hospitalized with Rhabdomyolysis and discharged to home after allegedly refusing offered skilled nursing placement. Pt lives alone and has continued to suffer multiple falls since discharge as well as being unable to take care of herself. Pt alert and oriented to self, place and situation at this time. With multiple bruises to body in various stages of healing- large deep purple/red bruise to left breast.

## 2020-08-06 NOTE — ED Notes (Signed)
MD notified of CK result, order already placed. States that pt will be an admission.

## 2020-08-06 NOTE — ED Notes (Signed)
Pt has bruising noted on bilateral hips, on left shoulder. Scabs on right knee.

## 2020-08-06 NOTE — ED Notes (Signed)
Pt assisted with raising HOB for comfort and repositioning

## 2020-08-06 NOTE — ED Notes (Signed)
IVC, pending MD eval and orders

## 2020-08-06 NOTE — ED Notes (Signed)
Changed into behavioral scrubs by this RN and ED [employer]. Placed back shoes, underwear, pants, shirt and gold colored ring into labeled belonging bag.

## 2020-08-06 NOTE — ED Provider Notes (Signed)
[facility] Emergency Department Provider Note ____________________________________________   Event Date/Time   First MD Initiated Contact with Patient 08/06/20 1833     (approximate)  I have reviewed the triage vital signs and the nursing notes.   HISTORY  Chief Complaint Failure To Thrive  Level 5 caveat: History of present illness limited due to unreliable historian  HPI Heidi Fuentes is a 75 y.o. female with PMH as noted below who presents under involuntary commitment due to inability to care for herself.  Per the IVC paperwork, the patient was just discharged from the hospital with rhabdomyolysis.  She was recommended to go to a SNF but refused.  She then had multiple falls at home since she was discharged yesterday and is unable to care for herself, and her family is concerned that she could seriously injure herself or worse if she does not get help.  The patient herself acknowledges that she has had difficulty caring for herself.  She initially stated that she was unsure why they discharged her home instead of to a rehab, however when I pointed out that she had refused the rehab she acknowledged this.  She states she did not want to go to rehab because it was supposed to be for 30 days, which she felt was too long.  The patient reports some soreness to bilateral knees and to her left breast but denies other acute symptoms.  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[place])     Patient Active Problem List   Diagnosis Date Noted   Rhabdomyolysis 08/03/2020   Elevated LFTs 08/03/2020   Unilateral primary osteoarthritis, left hip 03/26/2020   Pedal edema 03/18/2020   Pain in joint involving ankle and foot 12/20/2019   Bronchiectasis without complication ([place]) 74/01/8785   DOE (dyspnea on exertion) 07/27/2019   Dysphagia 07/25/2018   Leukocytosis 07/25/2018   Closed fracture of left orbital floor ([place]) 07/25/2018   Exudative age-related macular degeneration, bilateral, with active choroidal neovascularization ([place]) 07/25/2018   CAP (community acquired pneumonia) due to MRSA (methicillin resistant Staphylococcus aureus) ([place]) 02/13/2018   Asthma, chronic, unspecified asthma severity, with acute exacerbation 02/04/2018   Cough 11/02/2017   Gastroesophageal reflux disease without esophagitis 10/30/2017   Other specified glaucoma 76/72/0947   Uncomplicated asthma 09/62/8366   Chronic rhinitis 10/30/2017   Long-term current use of opiate analgesic 03/10/2017   Essential hypertension 02/17/2017   On long term drug therapy 02/08/2017   Chronic narcotic use 02/04/2017   Hiatal hernia 02/03/2017   Hypokalemia 09/06/2015   Prolonged Q-T interval on ECG 09/06/2015   Fall    Iron deficiency anemia, unspecified 12/15/2013   Schatzki's ring 08/17/2013   Anemia 08/17/2013   Encounter for screening colonoscopy 08/17/2013   Other constipation 07/10/2013  Chronic pain syndrome 07/10/2013   Bipolar disorder ([place]) 07/10/2013   Other specified hypothyroidism 07/09/2013   Colitis 07/09/2013   Asthma 07/09/2013    Past Surgical History:  Procedure Laterality Date   CATARACT EXTRACTION W/PHACO Right 10/06/2016   Procedure: CATARACT EXTRACTION PHACO AND INTRAOCULAR LENS PLACEMENT ([place]);  Surgeon: Birder Robson, MD;  Location: [facility] ORS;  Service: Ophthalmology;  Laterality: Right;  [place] 00:32.5 AP% 14.5 CDE 4.71 Fluid pack lot #  [id number]   CATARACT EXTRACTION W/PHACO Left 11/03/2016   Procedure: CATARACT EXTRACTION PHACO AND INTRAOCULAR LENS PLACEMENT (IOC);  Surgeon: Birder Robson, MD;  Location: [facility] ORS;  Service: Ophthalmology;  Laterality: Left;  [place] 00:36.6 AP% 16.0 CDE 5.86 Fluid Pack lot # [id number]   COCCYX REMOVAL     colonoscopy  2005   Dr. Laural Golden: mild melanosis coli, otherwise normal   COLONOSCOPY WITH PROPOFOL N/A 09/14/2013   KAJ:GOTLXBWIO coli. Colonic diverticulosis. Single colonic. Tubular adenoma. Next TCS 09/2020.   ERCP  1997   [facility]: biliary manometry abnormal, subsequent sphincterotomy    ESOPHAGEAL MANOMETRY N/A 12/21/2016   Procedure: ESOPHAGEAL MANOMETRY (EM);  Surgeon: Mauri Pole, MD;  Location: [facility];  Service: Endoscopy;  Laterality: N/A;   ESOPHAGOGASTRODUODENOSCOPY (EGD) WITH PROPOFOL N/A 09/14/2013   MBT:DHRCBULA'G ring. Hiatal hernia. Status post Venia Minks and biopsy disruption.    FOOT SURGERY     X2   HEMORROIDECTOMY     HERNIA REPAIR     inguinal right   HERNIA REPAIR     MALONEY DILATION N/A 09/14/2013   Procedure: Venia Minks DILATION;  Surgeon: [name], MD;  Location: [facility]S;  Service: Endoscopy;  Laterality: N/A;  68   POLYPECTOMY N/A 09/14/2013   Procedure: POLYPECTOMY;  Surgeon: [name], MD;  Location: [facility] ORS;  Service: Endoscopy;  Laterality: N/A;   TONSILLECTOMY      Prior to Admission medications   Medication Sig Start Date End Date Taking? Authorizing Provider  Budeson-Glycopyrrol-Formoterol (BREZTRI AEROSPHERE) 160-9-4.8 MCG/ACT AERO Inhale 2 puffs into the lungs in the morning and at bedtime. 03/18/20  Yes Rigoberto Noel, MD  buPROPion (WELLBUTRIN SR) 200 MG 12 hr tablet Take 200 mg by mouth daily.   Yes [provider]  cetirizine (ZYRTEC) 10 MG tablet Take 10 mg by mouth daily.   Yes [provider]  cholecalciferol (VITAMIN D3) 25 MCG (1000 UNIT) tablet Take 1,000 Units by mouth daily.   Yes [provider]   diltiazem (CARDIZEM CD) 120 MG 24 hr capsule Take 1 capsule (120 mg total) by mouth daily. 08/05/20 08/05/21 Yes Emokpae, Courage, MD  FLUoxetine (PROZAC) 40 MG capsule Take 40 mg by mouth daily.   Yes [provider]  gabapentin (NEURONTIN) 100 MG capsule Take 100 mg by mouth at bedtime. 11/17/17  Yes [provider]  lamoTRIgine (LAMICTAL) 200 MG tablet Take 200 mg by mouth 2 (two) times daily. 06/05/19  Yes [provider]  latanoprost (XALATAN) 0.005 % ophthalmic solution Place 1 drop into both eyes at bedtime. 04/20/13  Yes [provider]  levothyroxine (SYNTHROID, LEVOTHROID) 50 MCG tablet Take 50 mcg by mouth daily.   Yes [provider]  losartan (COZAAR) 50 MG tablet Take 50 mg by mouth daily.   Yes [provider]  Melatonin 3 MG CAPS Take 3 mg by mouth at bedtime as needed (for sleep).  11/17/17  Yes [provider]  montelukast (SINGULAIR) 10 MG tablet Take 10 mg by mouth daily. 05/03/13  Yes  [provider]  Multiple Vitamins-Minerals (PRESERVISION/LUTEIN PO) Take 1 tablet by mouth 2 (two) times daily.    Yes [provider]  Oxycodone HCl 10 MG TABS Take 10 mg by mouth every 6 (six) hours.   Yes [provider]  potassium chloride SA (KLOR-CON) 20 MEQ tablet Take 1 tablet (20 mEq total) by mouth daily. 08/05/20  Yes Roxan Hockey, MD  acetaminophen (TYLENOL) 500 MG tablet Take 500 mg by mouth every 6 (six) hours as needed for mild pain or moderate pain. For pain    [provider]  albuterol (VENTOLIN HFA) 108 (90 Base) MCG/ACT inhaler Inhale 2 puffs into the lungs every 6 (six) hours as needed for wheezing or shortness of breath. For shortness of breath    [provider]  Biotin 10000 MCG TABS Take 1 capsule by mouth daily. Patient not taking: No sig reported    [provider]  fluticasone (FLONASE) 50 MCG/ACT nasal spray Place 2 sprays into both nostrils daily as needed  for allergies.    [provider]  ipratropium-albuterol (DUONEB) 0.5-2.5 (3) MG/3ML SOLN Inhale 3 mLs into the lungs every 6 (six) hours as needed (shortness of breath). 11/18/17   [provider]  meclizine (ANTIVERT) 25 MG tablet Take 25 mg by mouth 3 (three) times daily as needed for dizziness.  08/02/17   [provider]  polyethylene glycol powder (GLYCOLAX/MIRALAX) 17 GM/SCOOP powder Take 17 g by mouth daily as needed for mild constipation or moderate constipation.  02/16/18   [provider]  tiZANidine (ZANAFLEX) 4 MG tablet Take 4 mg by mouth 2 (two) times daily as needed for muscle spasms.    [provider]    Allergies Abilify [aripiprazole], Geodon [ziprasidone hcl], Quetiapine, Sulfa antibiotics, and Trazodone and nefazodone  Family History  Problem Relation Age of Onset   Lung cancer Mother    Heart attack Father    Bone cancer Brother    COPD Sister    Colon cancer Neg Hx     Social History Social History   Tobacco Use   Smoking status: Never   Smokeless tobacco: Never   Tobacco comments:    NEVER SMOKED  Vaping Use   Vaping Use: Never used  Substance Use Topics   Alcohol use: No   Drug use: No    Review of Systems Level 5 caveat: Review of systems limited due to unreliable historian  Cardiovascular: Denies chest pain. Respiratory: Denies shortness of breath. Gastrointestinal: No vomiting. Genitourinary: Negative for dysuria.  Musculoskeletal: Negative for back pain.  Positive for knee pain. Skin: Negative for rash. Neurological: Negative for headache.   ____________________________________________   PHYSICAL EXAM:  VITAL SIGNS: ED Triage Vitals  Enc Vitals Group     BP 08/06/20 1816 131/79     Pulse Rate 08/06/20 1816 81     Resp 08/06/20 1816 18     Temp 08/06/20 1816 98 F (36.7 C)     Temp Source 08/06/20 1816 Oral     SpO2 08/06/20 1816 100 %     Weight 08/06/20 1817 139 lb 15.9 oz (63.5 kg)      Height 08/06/20 1817 5' (1.524 m)     Head Circumference --      Peak Flow --      Pain Score 08/06/20 1816 0     Pain Loc --      Pain Edu? --      Excl. in Jud? --  Constitutional: Alert and oriented.  Comfortable appearing, in no acute distress. Eyes: Conjunctivae are normal.  EOMI. Head: Atraumatic. Nose: No congestion/rhinnorhea. Mouth/Throat: Mucous membranes are moist.   Neck: Normal range of motion.  Cardiovascular: Normal rate, regular rhythm. Good peripheral circulation. Respiratory: Normal respiratory effort.  No retractions.  Gastrointestinal: No distention.  Musculoskeletal: No lower extremity edema.  Extremities warm and well perfused.  Full range of motion to bilateral hips and knees.  No focal tenderness.   Neurologic:  Normal speech and language.  Motor intact in all extremities.  Normal coordination with no ataxia. Skin:  Skin is warm and dry. No rash noted.  Left breast with moderate ecchymosis. Psychiatric: Calm and cooperative.  ____________________________________________   LABS (all labs ordered are listed, but only abnormal results are displayed)  Labs Reviewed  COMPREHENSIVE METABOLIC PANEL - Abnormal; Notable for the following components:      Result Value   Potassium 3.4 (*)    Chloride 95 (*)    Total Protein 6.3 (*)    AST 222 (*)    ALT 175 (*)    Total Bilirubin 1.7 (*)    All other components within normal limits  SALICYLATE LEVEL - Abnormal; Notable for the following components:   Salicylate Lvl <5.0 (*)    All other components within normal limits  ACETAMINOPHEN LEVEL - Abnormal; Notable for the following components:   Acetaminophen (Tylenol), Serum <10 (*)    All other components within normal limits  CBC - Abnormal; Notable for the following components:   WBC 13.6 (*)    All other components within normal limits  CK - Abnormal; Notable for the following components:   Total CK 4,384 (*)    All other components within normal  limits  SARS CORONAVIRUS 2 (TAT 6-24 HRS)  ETHANOL  URINE DRUG SCREEN, QUALITATIVE ([facility] ONLY)  URINALYSIS, COMPLETE (UACMP) WITH MICROSCOPIC   ____________________________________________  EKG   ____________________________________________  RADIOLOGY    ____________________________________________   PROCEDURES  Procedure(s) performed: No  Procedures  Critical Care performed: No ____________________________________________   INITIAL IMPRESSION / ASSESSMENT AND PLAN / ED COURSE  Pertinent labs & imaging results that were available during my care of the patient were reviewed by me and considered in my medical decision making (see chart for details).   75 year old female with PMH as noted above presents under involuntary commitment due to inability to care for herself (the family was concerned that she has had multiple falls since being discharged from the hospital yesterday, and had refused SNF.  I reviewed the past medical records in Epic and confirmed that the patient was admitted from 7/2 until yesterday with rhabdomyolysis after a fall.  Her most recent prior admission was in late 2021 for leg edema.  She has a documented history of bipolar disorder but I do not see any recent psychiatric visits or evaluations.  On exam the patient is comfortable appearing.  Her vital signs are normal.  The physical exam is unremarkable except for bruising to the left breast.  Neurologic exam is nonfocal.  Ultimately, it appears that the patient cannot safely take care of herself at home and will need placement.  Based on my initial assessment she does not appear psychotic and has no intention for self-harm, however she also seems to have poor insight into her condition.  Since she was placed under involuntary commitment I will have psychiatry evaluate her primarily for capacity assessment although I doubt she will require psychiatric admission.  We  will obtain lab work-up for medical  clearance and to identify any new or recurrent medical issues that would warrant inpatient admission.    ----------------------------------------- 10:15 PM on 08/06/2020 -----------------------------------------  The patient CK is elevated, higher than it was on her previous admission.  I have ordered fluids.  She will need readmission to the medical service although I think a psychiatry evaluation to evaluate for capacity would be helpful.  I consulted Dr. Gayla Medicus from the hospitalist service for admission.  ____________________________________________   FINAL CLINICAL IMPRESSION(S) / ED DIAGNOSES  Final diagnoses:  Multiple falls  Traumatic rhabdomyolysis, subsequent encounter      NEW MEDICATIONS STARTED DURING THIS VISIT:  New Prescriptions   No medications on file     Note:  This document was prepared using Dragon voice recognition software and may include unintentional dictation errors.    Arta Silence, MD 08/06/20 2216

## 2020-08-06 NOTE — ED Notes (Signed)
Given sandwich and drink. Pt states she has not eaten in a few days.

## 2020-08-06 NOTE — ED Notes (Signed)
Hourly rounding performed, patient currently awake in room, provided warm blanket. Patient has no complaints at this time. Q15 minute rounds and monitoring via Engineer, drilling to continue.

## 2020-08-06 NOTE — ED Notes (Signed)
Hourly rounding performed, patient currently awake in room. Patient has no complaints at this time. Q15 minute rounds and monitoring via [employer] to continue.

## 2020-08-06 NOTE — ED Notes (Signed)
Garage door in room raised at this time. Pt placed on pulse ox and BP cuff. Suction used for pure wick and IV pole in room. Pt is psych cleared per Kennyth Lose, NP

## 2020-08-06 NOTE — ED Notes (Signed)
Pt found I bed eating snack. Pt reports that she is here due to her son and that her son does not understand she cannot care for herself at home because it is easier to use restroom on self instead of going to restroom and cleaning self. Pt calm and cooperative, pt educated on need to call for assistance when restroom service is needed. Pt expresses understanding.

## 2020-08-06 NOTE — ED Notes (Signed)
Awaiting for meds to be verified by pharmacy to administer.

## 2020-08-06 NOTE — ED Notes (Signed)
Psych NP and TTS at bedside for pt assessment now.

## 2020-08-06 NOTE — ED Notes (Signed)
This nurse calls lab, asks to have CK added on. Elmyra Ricks in lab states that it is in process and cannot be placed in process in Epic until tube is off of machine. States she will do so as soon as possible.

## 2020-08-06 NOTE — ED Notes (Signed)
Pt asked nurse to use restroom, states she only needs to urinate. Pt assisted to bed side commode with one person assistance, pt unable to do any ambulation on own. Pt uses restroom and pt wipes self, pt had BM and was unable to collect urine in cup. Pt while wiping got stool over over hands and self all while complaining that this is what happens at home. Pt assisted with cleaning by this nurse and tech, [place]. Pt placed in brief as she had had stool in prior brief and appears to be incontinent. Pt unable to stand for extended period of time and shuffles/ pivots only. States she could walk yesterday, but with weakness that is in question. Pt placed in bed and sacral dressing placed for safety as pt has bruising and slight breakdown on sacral area.

## 2020-08-06 NOTE — ED Notes (Signed)
Hourly rounding performed, patient currently awake in room. Patient has no complaints at this time. Q15 minute rounds and monitoring via Engineer, drilling to continue.

## 2020-08-06 NOTE — Consult Note (Signed)
[facility] Consult   Reason for Consult: Failure To Thrive Referring Physician:  Dr. Cherylann Banas, Patient Identification: Heidi Fuentes MRN:  [id number] Principal Diagnosis: <principal problem not specified> Diagnosis:  Active Problems:   Chronic pain syndrome   Bipolar disorder ([place])   Anemia   Fall   Rhabdomyolysis   Total Time spent with patient: 20 minutes  Subjective: "I want to go to the Rehab place near my son in [place]."  Heidi Fuentes is a 75 y.o. female patient presented to [facility] ED via law enforcement under involuntary commitment status (IVC).  Per the ED triage nurse note, the patient was brought to the ED due to failure to thrive. Pt recently hospitalized with Rhabdomyolysis and discharged to home after allegedly refusing offered skilled nursing placement. Pt lives alone and has continued to suffer multiple falls since discharge as well as being unable to take care of herself. Pt alert and oriented to self, place and situation at this time. With multiple bruises to body in various stages of healing- large deep purple/red bruise to left breast. During the patient assessment, she stated, "I am okay to go to a rehab facility if it is near my son in [place]."  The patient admits to being unable to care for herself at this moment. She states, "the last time I was at the rehab facility, I was there for a long time."  Education provided to the patient; they will not discharge her unless she can show them she can live independently and be safe doing so. The patient was seen face-to-face by this provider; the chart was reviewed and consulted with Dr. Cherylann Banas on 08/06/2020 due to the patient's care. It was discussed with the EDP that the patient does not meet the criteria to be admitted to the psychiatric inpatient unit.  On evaluation, the patient is alert and oriented x 4, calm, cooperative, and mood-congruent with affect. The patient does not appear to be  responding to internal or external stimuli. Neither is the patient presenting with any delusional thinking. The patient denies auditory or visual hallucinations. The patient denies any suicidal, homicidal, or self-harm ideations. The patient is not presenting with any psychotic or paranoid behaviors. During an encounter with the patient, she could answer questions appropriately.  Plan: The patient is psychiatrically cleared.  HPI:  Dr. Cherylann Banas, Heidi Fuentes is a 75 y.o. female with PMH as noted below who presents under involuntary commitment due to inability to care for herself.  Per the IVC paperwork, the patient was just discharged from the hospital with rhabdomyolysis.  She was recommended to go to a SNF but refused.  She then had multiple falls at home since she was discharged yesterday and is unable to care for herself, and her family is concerned that she could seriously injure herself or worse if she does not get help.   The patient herself acknowledges that she has had difficulty caring for herself.  She initially stated that she was unsure why they discharged her home instead of to a rehab, however when I pointed out that she had refused the rehab she acknowledged this.  She states she did not want to go to rehab because it was supposed to be for 30 days, which she felt was too long.   The patient reports some soreness to bilateral knees and to her left breast but denies other acute symptoms.  Past Psychiatric History:  Chronic pain syndrome Depression  Risk to Self:  Risk to Others:   Prior Inpatient Therapy:   Prior Outpatient Therapy:    Past Medical History:  Past Medical History:  Diagnosis Date   Arthritis    Asthma    Back pain    Chronic pain syndrome 07/10/2013   Depression    Duodenal papillary stenosis    Dyspnea    Esophageal dysmotility    Fibromyalgia    GERD (gastroesophageal reflux disease)    Glaucoma    H/O wheezing    History of hiatal hernia     History of palpitations    HOH (hard of hearing)    HOH (hard of hearing)    Hypertension    Hypothyroidism    Macular degeneration    Orthopnea    Stenosis, spinal, lumbar    Thyroid disease    Tubular adenoma    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[facility])     Past Surgical History:  Procedure Laterality Date   CATARACT EXTRACTION W/PHACO Right 10/06/2016   Procedure: CATARACT EXTRACTION PHACO AND INTRAOCULAR LENS PLACEMENT ([place]);  Surgeon: Birder Robson, MD;  Location: [facility] ORS;  Service: Ophthalmology;  Laterality: Right;  [place] 00:32.5 AP% 14.5 CDE 4.71 Fluid pack lot # [id number]   CATARACT EXTRACTION W/PHACO Left 11/03/2016   Procedure: CATARACT EXTRACTION PHACO AND INTRAOCULAR LENS PLACEMENT ([facility]);  Surgeon: Birder Robson, MD;  Location: [facility] ORS;  Service: Ophthalmology;  Laterality: Left;  [place] 00:36.6 AP% 16.0 CDE 5.86 Fluid Pack lot # [id number]   COCCYX REMOVAL     colonoscopy  2005   Dr. Laural Golden: mild melanosis coli, otherwise normal   COLONOSCOPY WITH PROPOFOL N/A 09/14/2013   XTK:WIOXBDZHG coli. Colonic diverticulosis. Single colonic. Tubular adenoma. Next TCS 09/2020.   ERCP  1997   [facility]: biliary manometry abnormal, subsequent sphincterotomy    ESOPHAGEAL MANOMETRY N/A 12/21/2016   Procedure: ESOPHAGEAL MANOMETRY (EM);  Surgeon: Mauri Pole, MD;  Location: [facility];  Service: Endoscopy;  Laterality: N/A;   ESOPHAGOGASTRODUODENOSCOPY (EGD) WITH PROPOFOL N/A 09/14/2013   DJM:[id number] ring. Hiatal hernia. Status post Venia Minks and biopsy disruption.    FOOT SURGERY     X2   HEMORROIDECTOMY     HERNIA REPAIR     inguinal right   HERNIA REPAIR     MALONEY DILATION N/A 09/14/2013   Procedure: Venia Minks DILATION;  Surgeon: [name], MD;  Location: [facility];  Service: Endoscopy;  Laterality: N/A;  109   POLYPECTOMY N/A 09/14/2013   Procedure: POLYPECTOMY;  Surgeon: [name], MD;  Location: [facility];  Service: Endoscopy;  Laterality: N/A;   TONSILLECTOMY      Family History:  Family History  Problem Relation Age of Onset   Lung cancer Mother    Heart attack Father    Bone cancer Brother    COPD Sister    Colon cancer Neg Hx    Family Psychiatric  History:  Social History:  Social History   Substance and Sexual Activity  Alcohol Use No     Social History   Substance and Sexual Activity  Drug Use No    Social History   Socioeconomic History   Marital status: Divorced    Spouse name: Not on file   Number of children: Not on file   Years of education: Not on file   Highest education level: Not on file  Occupational History   Not on file  Tobacco Use   Smoking status: Never   Smokeless tobacco: Never   Tobacco comments:  NEVER SMOKED  Vaping Use   Vaping Use: Never used  Substance and Sexual Activity   Alcohol use: No   Drug use: No   Sexual activity: Not on file  Other Topics Concern   Not on file  Social History Narrative   Not on file   Social Determinants of Health   Financial Resource Strain: Not on file  Food Insecurity: Not on file  Transportation Needs: Not on file  Physical Activity: Not on file  Stress: Not on file  Social Connections: Not on file   Additional Social History:    Allergies:   Allergies  Allergen Reactions   Abilify [Aripiprazole] Other (See Comments)    Bad dreams   Geodon [Ziprasidone Hcl] Other (See Comments)    hospitalization specifics   Quetiapine Palpitations   Sulfa Antibiotics Rash   Trazodone And Nefazodone Other (See Comments)    Reaction is unknown    Labs:  Results for orders placed or performed during the hospital encounter of 08/06/20 (from the past 48 hour(s))  Comprehensive metabolic panel     Status: Abnormal   Collection Time: 08/06/20  6:14 PM  Result Value Ref Range   Sodium 138 135 - 145 mmol/L   Potassium 3.4 (L) 3.5 - 5.1 mmol/L   Chloride 95 (L) 98 - 111 mmol/L   CO2 30 22 - 32 mmol/L   Glucose, Bld 85 70 - 99 mg/dL    Comment: Glucose  reference range applies only to samples taken after fasting for at least 8 hours.   BUN 14 8 - 23 mg/dL   Creatinine, Ser 0.63 0.44 - 1.00 mg/dL   Calcium 9.0 8.9 - 10.3 mg/dL   Total Protein 6.3 (L) 6.5 - 8.1 g/dL   Albumin 3.7 3.5 - 5.0 g/dL   AST 222 (H) 15 - 41 U/L   ALT 175 (H) 0 - 44 U/L   Alkaline Phosphatase 56 38 - 126 U/L   Total Bilirubin 1.7 (H) 0.3 - 1.2 mg/dL   GFR, Estimated >60 >60 mL/min    Comment: (NOTE) Calculated using the CKD-EPI Creatinine Equation (2021)    Anion gap 13 5 - 15    Comment: Performed at [facility], [address] [place], [place] [address]  Ethanol     Status: None   Collection Time: 08/06/20  6:14 PM  Result Value Ref Range   Alcohol, Ethyl (B) <10 <10 mg/dL    Comment: (NOTE) Lowest detectable limit for serum alcohol is 10 mg/dL.  For medical purposes only. Performed at [facility], [address] [place], [place] [address]   Salicylate level     Status: Abnormal   Collection Time: 08/06/20  6:14 PM  Result Value Ref Range   Salicylate Lvl <7.3 (L) 7.0 - 30.0 mg/dL    Comment: Performed at [facility], [address] [place], [place] [address]  Acetaminophen level     Status: Abnormal   Collection Time: 08/06/20  6:14 PM  Result Value Ref Range   Acetaminophen (Tylenol), Serum <10 (L) 10 - 30 ug/mL    Comment: (NOTE) Therapeutic concentrations vary significantly. A range of 10-30 ug/mL  may be an effective concentration for many patients. However, some  are best treated at concentrations outside of this range. Acetaminophen concentrations >150 ug/mL at 4 hours after ingestion  and >50 ug/mL at 12 hours after ingestion are often associated with  toxic reactions.  Performed at [facility], [address] [place], [place] [address]   cbc     Status: Abnormal   Collection Time: 08/06/20  6:14 PM  Result Value Ref Range   WBC 13.6 (H) 4.0 - 10.5 K/uL   RBC 4.33 3.87 - 5.11 MIL/uL    Hemoglobin 13.4 12.0 - 15.0 g/dL   HCT 39.6 36.0 - 46.0 %   MCV 91.5 80.0 - 100.0 fL   MCH 30.9 26.0 - 34.0 pg   MCHC 33.8 30.0 - 36.0 g/dL   RDW 14.2 11.5 - 15.5 %   Platelets 373 150 - 400 K/uL   nRBC 0.0 0.0 - 0.2 %    Comment: Performed at [facility], [address] [place], [place] [address]  CK     Status: Abnormal   Collection Time: 08/06/20  6:14 PM  Result Value Ref Range   Total CK 4,384 (H) 38 - 234 U/L    Comment: RESULT CONFIRMED BY MANUAL DILUTION MJU/RH Performed at [facility], [address], [place], [place] [address]     Current Facility-Administered Medications  Medication Dose Route Frequency Provider Last Rate Last Admin   buPROPion [facility] SR) 12 hr tablet 200 mg  200 mg Oral BID Caroline Sauger, NP       Derrill Memo ON 08/07/2020] FLUoxetine (PROZAC) capsule 40 mg  40 mg Oral Daily Caroline Sauger, NP       gabapentin (NEURONTIN) capsule 100 mg  100 mg Oral QHS Caroline Sauger, NP       melatonin tablet 5 mg  5 mg Oral QHS PRN Caroline Sauger, NP       Current Outpatient Medications  Medication Sig Dispense Refill   Budeson-Glycopyrrol-Formoterol (BREZTRI AEROSPHERE) 160-9-4.8 MCG/ACT AERO Inhale 2 puffs into the lungs in the morning and at bedtime. 10.7 g 3   buPROPion (WELLBUTRIN SR) 200 MG 12 hr tablet Take 200 mg by mouth daily.     cetirizine (ZYRTEC) 10 MG tablet Take 10 mg by mouth daily.     cholecalciferol (VITAMIN D3) 25 MCG (1000 UNIT) tablet Take 1,000 Units by mouth daily.     diltiazem (CARDIZEM CD) 120 MG 24 hr capsule Take 1 capsule (120 mg total) by mouth daily. 30 capsule 11   FLUoxetine (PROZAC) 40 MG capsule Take 40 mg by mouth daily.     gabapentin (NEURONTIN) 100 MG capsule Take 100 mg by mouth at bedtime.     lamoTRIgine (LAMICTAL) 200 MG tablet Take 200 mg by mouth 2 (two) times daily.     latanoprost (XALATAN) 0.005 % ophthalmic solution Place 1 drop into both eyes at bedtime.      levothyroxine (SYNTHROID, LEVOTHROID) 50 MCG tablet Take 50 mcg by mouth daily.     losartan (COZAAR) 50 MG tablet Take 50 mg by mouth daily.     Melatonin 3 MG CAPS Take 3 mg by mouth at bedtime as needed (for sleep).      montelukast (SINGULAIR) 10 MG tablet Take 10 mg by mouth daily.     Multiple Vitamins-Minerals (PRESERVISION/LUTEIN PO) Take 1 tablet by mouth 2 (two) times daily.      Oxycodone HCl 10 MG TABS Take 10 mg by mouth every 6 (six) hours.     potassium chloride SA (KLOR-CON) 20 MEQ tablet Take 1 tablet (20 mEq total) by mouth daily. 30 tablet 5   acetaminophen (TYLENOL) 500 MG tablet Take 500 mg by mouth every 6 (six) hours as needed for mild pain or moderate pain. For pain  albuterol (VENTOLIN HFA) 108 (90 Base) MCG/ACT inhaler Inhale 2 puffs into the lungs every 6 (six) hours as needed for wheezing or shortness of breath. For shortness of breath     Biotin 10000 MCG TABS Take 1 capsule by mouth daily. (Patient not taking: No sig reported)     fluticasone (FLONASE) 50 MCG/ACT nasal spray Place 2 sprays into both nostrils daily as needed for allergies.     ipratropium-albuterol (DUONEB) 0.5-2.5 (3) MG/3ML SOLN Inhale 3 mLs into the lungs every 6 (six) hours as needed (shortness of breath).  0   meclizine (ANTIVERT) 25 MG tablet Take 25 mg by mouth 3 (three) times daily as needed for dizziness.      polyethylene glycol powder (GLYCOLAX/MIRALAX) 17 GM/SCOOP powder Take 17 g by mouth daily as needed for mild constipation or moderate constipation.      tiZANidine (ZANAFLEX) 4 MG tablet Take 4 mg by mouth 2 (two) times daily as needed for muscle spasms.      Musculoskeletal: Strength & Muscle Tone: decreased Gait & Station: unsteady Patient leans: N/A  Psychiatric Specialty Exam:  Presentation  General Appearance: Appropriate for Environment  Eye Contact:Good  Speech:Clear and Coherent  Speech Volume:Normal  Handedness:Right   Mood and Affect   Mood:Depressed  Affect:Appropriate   Thought Process  Thought Processes:Coherent  Descriptions of Associations:Intact  Orientation:Full (Time, Place and Person)  Thought Content:Logical  History of Schizophrenia/Schizoaffective disorder:No  Duration of Psychotic Symptoms:N/A  Hallucinations:Hallucinations: None  Ideas of Reference:None  Suicidal Thoughts:Suicidal Thoughts: No  Homicidal Thoughts:Homicidal Thoughts: No   Sensorium  Memory:Immediate Good; Recent Good; Remote Good  Judgment:Good  Insight:Good   Executive Functions  Concentration:Good  Attention Span:Good  [place] of Knowledge:Good  Language:Good   Psychomotor Activity  Psychomotor Activity:Psychomotor Activity: Decreased; Tremor   Assets  Assets:Communication Skills; Desire for Improvement; Physical Health; Resilience   Sleep  Sleep:Sleep: Fair   Physical Exam: Physical Exam Vitals and nursing note reviewed.  Constitutional:      Appearance: She is normal weight. She is ill-appearing.  HENT:     Head: Normocephalic.     Nose: Nose normal.     Mouth/Throat:     Mouth: Mucous membranes are moist.  Cardiovascular:     Rate and Rhythm: Normal rate.     Pulses: Normal pulses.  Pulmonary:     Effort: Pulmonary effort is normal.  Musculoskeletal:        General: Tenderness present.     Cervical back: Normal range of motion and neck supple.  Neurological:     Mental Status: She is alert and oriented to person, place, and time.     Motor: Weakness present.     Gait: Gait abnormal.  Psychiatric:        Attention and Perception: Attention and perception normal.        Mood and Affect: Mood is depressed.        Speech: Speech normal.        Behavior: Behavior normal. Behavior is cooperative.        Thought Content: Thought content normal.        Cognition and Memory: Cognition and memory normal.        Judgment: Judgment normal.   Review of Systems   Psychiatric/Behavioral:  Positive for depression.   All other systems reviewed and are negative. Blood pressure (!) 144/57, pulse 92, temperature 98 F (36.7 C), temperature source Oral, resp. rate 20, height 5' (1.524 m), weight 63.5 kg,  SpO2 100 %. Body mass index is 27.34 kg/m.  Treatment Plan Summary: Plan The patient is not a safety risk to herself or others and does not require psychiatric inpatient admission for stabilization and treatment.  Disposition: No evidence of imminent risk to self or others at present.   Patient does not meet criteria for psychiatric inpatient admission. Supportive therapy provided about ongoing stressors.  Caroline Sauger, NP 08/06/2020 10:22 PM

## 2020-08-06 NOTE — ED Notes (Signed)
This nurse speaks to pharm tech requesting a med rec for patient. Informed that it will be completed as soon as possible.

## 2020-08-06 NOTE — ED Notes (Signed)
Report received from [place], Conservation officer, nature. Patient alert and oriented, warm and dry, in no acute distress. Patient denies SI, HI, AVH and pain. Patient made aware of Q15 minute rounds and security cameras for their safety. Patient instructed to come to this nurse with needs or concerns.

## 2020-08-06 NOTE — H&P (Addendum)
[place]   PATIENT NAME: Heidi Fuentes    MR#:  [id number]  DATE OF BIRTH:  03-08-1945  DATE OF ADMISSION:  08/06/2020  PRIMARY CARE PHYSICIAN: Zhou-Talbert, Elwyn Lade, MD   Patient is coming from: Home  REQUESTING/REFERRING PHYSICIAN: Arta Silence, MD  CHIEF COMPLAINT:   Chief Complaint  Patient presents with  . Failure To Thrive    HISTORY OF PRESENT ILLNESS:  Heidi Fuentes is a 75 y.o. Caucasian female with medical history significant for multiple problems including d negative who presented to the emergency room with acute onset of recurrent falls with involuntary commitment due to inability to care for herself.  She was just discharged from [facility] for acute rhabdomyolysis and was recommended to go to a skilled nursing facility but refused.  Since her discharge she had multiple falls at home and her family is concerned that she could seriously injure herself if she does not get help.  The patient admits to me that she has been feeling weak and has been having myalgia from her several falls and cannot get up.  She stated that she has not been eating secondarily.  No nausea or vomiting.  No fever or chills.  She admits to dysuria without urinary frequency or urgency or flank pain.  She refused going to rehab as she states it was too long to stay 30 days there.  No chest pain or palpitations.  No cough or wheezing or hemoptysis.  Upon presentation to the emergency room, blood pressure was 144/57 with otherwise normal vital signs.  Labs revealed mild hypokalemia of 3.4 and elevated AST of 222 compared to 84 a day ago, ALT 175 compared to 121, to obtain of 6.3 with albumin 3.7.  CK was 4384 compared to 937.  CBC showed leukocytosis 13.6 compared to 10.4 a couple of days ago.  Tylenol level was less than 1 and salicylate level less than 7.  Alcohol level less than 10 and salicylate less than 7.  Patient had a urinalysis 2 days ago that showed a WBC of 6-10 and trace  leukocytes with few bacteria.  Urine culture done was contaminated.  The patient was given 1 L bolus of IV normal saline.  She will be admitted to a medical  bed for further evaluation and management PAST MEDICAL HISTORY:   Past Medical History:  Diagnosis Date  . Arthritis   . Asthma   . Back pain   . Chronic pain syndrome 07/10/2013  . Depression   . Duodenal papillary stenosis   . Dyspnea   . Esophageal dysmotility   . Fibromyalgia   . GERD (gastroesophageal reflux disease)   . Glaucoma   . H/O wheezing   . History of hiatal hernia   . History of palpitations   . HOH (hard of hearing)   . HOH (hard of hearing)   . Hypertension   . Hypothyroidism   . Macular degeneration   . Orthopnea   . Stenosis, spinal, lumbar   . Thyroid disease   . Tubular adenoma   . 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 CATARACT EXTRACTION W/PHACO Right 10/06/2016   Procedure: CATARACT EXTRACTION PHACO AND INTRAOCULAR LENS PLACEMENT (IOC);  Surgeon: Birder Robson, MD;  Location: [facility] ORS;  Service: Ophthalmology;  Laterality: Right;  [place] 00:32.5 AP% 14.5 CDE 4.71 Fluid pack lot # [id number]  . CATARACT  EXTRACTION W/PHACO Left 11/03/2016   Procedure: CATARACT EXTRACTION PHACO AND INTRAOCULAR LENS PLACEMENT (IOC);  Surgeon: Birder Robson, MD;  Location: [facility] ORS;  Service: Ophthalmology;  Laterality: Left;  [place] 00:36.6 AP% 16.0 CDE 5.86 Fluid Pack lot # [id number]  . COCCYX REMOVAL    . colonoscopy  2005   Dr. Laural Golden: mild melanosis coli, otherwise normal  . COLONOSCOPY WITH PROPOFOL N/A 09/14/2013   WNU:[id number] coli. Colonic diverticulosis. Single colonic. Tubular adenoma. Next TCS 09/2020.  [name] ERCP  1997   [facility]: biliary manometry abnormal, subsequent sphincterotomy   . ESOPHAGEAL MANOMETRY N/A 12/21/2016   Procedure: ESOPHAGEAL MANOMETRY (EM);  Surgeon: Mauri Pole, MD;  Location: [facility];   Service: Endoscopy;  Laterality: N/A;  . ESOPHAGOGASTRODUODENOSCOPY (EGD) WITH PROPOFOL N/A 09/14/2013   IHK:[id number]'O ring. Hiatal hernia. Status post Venia Minks and biopsy disruption.   [name] FOOT SURGERY     X2  . HEMORROIDECTOMY    . HERNIA REPAIR     inguinal right  . [place] N/A 09/14/2013   Procedure: Venia Minks DILATION;  Surgeon: [name], MD;  Location: [facility];  Service: Endoscopy;  Laterality: N/A;  56  . POLYPECTOMY N/A 09/14/2013   Procedure: POLYPECTOMY;  Surgeon: [name], MD;  Location: [facility];  Service: Endoscopy;  Laterality: N/A;  . TONSILLECTOMY      SOCIAL HISTORY:   Social History   Tobacco Use  . Smoking status: Never  . Smokeless tobacco: Never  . Tobacco comments:    NEVER SMOKED  Substance Use Topics  . Alcohol use: No    FAMILY HISTORY:   Family History  Problem Relation Age of Onset  . Lung cancer Mother   . Heart attack Father   . Bone cancer Brother   . COPD Sister   . Colon cancer Neg Hx     DRUG ALLERGIES:   Allergies  Allergen Reactions  . Abilify [Aripiprazole] Other (See Comments)    Bad dreams  . Geodon [Ziprasidone Hcl] Other (See Comments)    hospitalization specifics  . Quetiapine Palpitations  . Sulfa Antibiotics Rash  . Trazodone And Nefazodone Other (See Comments)    Reaction is unknown    REVIEW OF SYSTEMS:   ROS As per history of present illness. All pertinent systems were reviewed above. Constitutional, HEENT, cardiovascular, respiratory, GI, GU, musculoskeletal, neuro, psychiatric, endocrine, integumentary and hematologic systems were reviewed and are otherwise negative/unremarkable except for positive findings mentioned above in the HPI.   MEDICATIONS AT HOME:   Prior to Admission medications   Medication Sig Start Date End Date Taking? Authorizing Provider  Budeson-Glycopyrrol-Formoterol (BREZTRI AEROSPHERE) 160-9-4.8 MCG/ACT AERO Inhale 2 puffs into the lungs in the morning and  at bedtime. 03/18/20  Yes Rigoberto Noel, MD  buPROPion (WELLBUTRIN SR) 200 MG 12 hr tablet Take 200 mg by mouth daily.   Yes [provider]  cetirizine (ZYRTEC) 10 MG tablet Take 10 mg by mouth daily.   Yes [provider]  cholecalciferol (VITAMIN D3) 25 MCG (1000 UNIT) tablet Take 1,000 Units by mouth daily.   Yes [provider]  diltiazem (CARDIZEM CD) 120 MG 24 hr capsule Take 1 capsule (120 mg total) by mouth daily. 08/05/20 08/05/21 Yes Emokpae, Courage, MD  FLUoxetine (PROZAC) 40 MG capsule Take 40 mg by mouth daily.   Yes [provider]  gabapentin (NEURONTIN) 100 MG capsule Take 100 mg by mouth at bedtime. 11/17/17  Yes [provider]  lamoTRIgine (LAMICTAL) 200 MG tablet Take 200 mg by mouth 2 (two) times daily. 06/05/19  Yes [provider]  latanoprost (XALATAN) 0.005 % ophthalmic solution Place 1 drop into both eyes at bedtime. 04/20/13  Yes [provider]  levothyroxine (SYNTHROID, LEVOTHROID) 50 MCG tablet Take 50 mcg by mouth daily.   Yes [provider]  losartan (COZAAR) 50 MG tablet Take 50 mg by mouth daily.   Yes [provider]  Melatonin 3 MG CAPS Take 3 mg by mouth at bedtime as needed (for sleep).  11/17/17  Yes [provider]  montelukast (SINGULAIR) 10 MG tablet Take 10 mg by mouth daily. 05/03/13  Yes [provider]  Multiple Vitamins-Minerals (PRESERVISION/LUTEIN PO) Take 1 tablet by mouth 2 (two) times daily.    Yes [provider]  Oxycodone HCl 10 MG TABS Take 10 mg by mouth every 6 (six) hours.   Yes [provider]  potassium chloride SA (KLOR-CON) 20 MEQ tablet Take 1 tablet (20 mEq total) by mouth daily. 08/05/20  Yes Roxan Hockey, MD  acetaminophen (TYLENOL) 500 MG tablet Take 500 mg by mouth every 6 (six) hours as needed for mild pain or moderate pain. For pain    [provider]  albuterol (VENTOLIN HFA) 108 (90 Base) MCG/ACT inhaler  Inhale 2 puffs into the lungs every 6 (six) hours as needed for wheezing or shortness of breath. For shortness of breath    [provider]  Biotin 10000 MCG TABS Take 1 capsule by mouth daily. Patient not taking: No sig reported    [provider]  fluticasone (FLONASE) 50 MCG/ACT nasal spray Place 2 sprays into both nostrils daily as needed for allergies.    [provider]  ipratropium-albuterol (DUONEB) 0.5-2.5 (3) MG/3ML SOLN Inhale 3 mLs into the lungs every 6 (six) hours as needed (shortness of breath). 11/18/17   [provider]  meclizine (ANTIVERT) 25 MG tablet Take 25 mg by mouth 3 (three) times daily as needed for dizziness.  08/02/17   [provider]  polyethylene glycol powder (GLYCOLAX/MIRALAX) 17 GM/SCOOP powder Take 17 g by mouth daily as needed for mild constipation or moderate constipation.  02/16/18   [provider]  tiZANidine (ZANAFLEX) 4 MG tablet Take 4 mg by mouth 2 (two) times daily as needed for muscle spasms.    [provider]      VITAL SIGNS:  Blood pressure (!) 144/57, pulse 92, temperature 98 F (36.7 C), temperature source Oral, resp. rate 20, height 5' (1.524 m), weight 63.5 kg, SpO2 100 %.  PHYSICAL EXAMINATION:  Physical Exam  GENERAL:  75 y.o.-year-old Caucasian female patient lying in the bed with no acute distress.  EYES: Pupils equal, round, reactive to light and accommodation. No scleral icterus. Extraocular muscles intact.  HEENT: Head atraumatic, normocephalic. Oropharynx and nasopharynx clear.  NECK:  Supple, no jugular venous distention. No thyroid enlargement, no tenderness.  LUNGS: Normal breath sounds bilaterally, no wheezing, rales,rhonchi or crepitation. No use of accessory muscles of respiration.  CARDIOVASCULAR: Regular rate and rhythm, S1, S2 normal. No murmurs, rubs, or gallops.  ABDOMEN: Soft, nondistended, nontender. Bowel sounds present. No organomegaly or mass.   EXTREMITIES: No pedal edema, cyanosis, or clubbing.  NEUROLOGIC: Cranial nerves II through XII are intact. Muscle strength 5/5 in all extremities. Sensation intact. Gait not checked.  PSYCHIATRIC: The patient is alert and oriented x 3.  Normal affect and good eye contact. SKIN: No obvious rash, lesion, or  ulcer.   LABORATORY PANEL:   CBC Recent Labs  Lab 08/06/20 1814  WBC 13.6*  HGB 13.4  HCT 39.6  PLT 373   ------------------------------------------------------------------------------------------------------------------  Chemistries  Recent Labs  Lab 08/06/20 1814  NA 138  K 3.4*  CL 95*  CO2 30  GLUCOSE 85  BUN 14  CREATININE 0.63  CALCIUM 9.0  AST 222*  ALT 175*  ALKPHOS 56  BILITOT 1.7*   ------------------------------------------------------------------------------------------------------------------  Cardiac Enzymes No results for input(s): TROPONINI in the last 168 hours. ------------------------------------------------------------------------------------------------------------------  RADIOLOGY:  No results found.    IMPRESSION AND PLAN:  Active Problems:   Chronic pain syndrome   Bipolar disorder (HCC)   Anemia   Fall   Rhabdomyolysis  1.  Acute rhabdomyolysis secondary to recurrent falls. - The patient was admitted to a medical bed. - She will be hydrated with IV normal saline. - We will follow daily CK levels. - She will be on fall precautions. - PT consult to be obtained. - Case management to be obtained to arrange for SNF discharge.  Hypokalemia. - Potassium will be placed and magnesium level will be checked.- - The patient is status post IVC.  2.  Elevated LFTs. - The patient will be hydrated with IV normal saline and will follow her LFTs.  3.  Dysuria. - We will obtain a urinalysis +/- urine culture and sensitivity.  4.  Depression. - We will continue Prozac and Wellbutrin SR. - Psychiatry evaluation will be obtained. - I  notified Dr. Domingo Cocking about the patient.  5.  Bipolar disorder. - We will continue Lamictal.  6.  Hypothyroidism. - We will continue Synthroid.  7.  Asthma without exacerbation. - We will continue her inhalers.  DVT prophylaxis: Lovenox. Code Status: full code. Family Communication:  The plan of care was discussed in details with the patient (and family). I answered all questions. The patient agreed to proceed with the above mentioned plan. Further management will depend upon hospital course. Disposition Plan: Back to previous home environment Consults called: none. All the records are reviewed and case discussed with ED provider.  Status is: Inpatient  Remains inpatient appropriate because:Altered mental status, Ongoing diagnostic testing needed not appropriate for outpatient work up, Unsafe d/c plan, IV treatments appropriate due to intensity of illness or inability to take PO, and Inpatient level of care appropriate due to severity of illness  Dispo: The patient is from: Home              Anticipated d/c is to: SNF              Patient currently is not medically stable to d/c.   Difficult to place patient No  TOTAL TIME TAKING CARE OF THIS PATIENT: 55 minutes.    Christel Mormon M.D on 08/06/2020 at 10:32 PM  [facility]   From 7 PM-7 AM, contact night-coverage [email or web address]  CC: Primary care physician; Zhou-Talbert, Elwyn Lade, MD

## 2020-08-06 NOTE — ED Notes (Signed)
Pt calls nurse into room, states she needs to have another BM. PT placed on bed pan, later calls out that she is done and this nurse takes pt off bed pan. Pt did not go and states she passed gas before she got on bed pan and thought she would need to. Pt returned to position of comfort.

## 2020-08-07 ENCOUNTER — Encounter: Payer: Self-pay | Admitting: Family Medicine

## 2020-08-07 DIAGNOSIS — F3131 Bipolar disorder, current episode depressed, mild: Secondary | ICD-10-CM | POA: Diagnosis present

## 2020-08-07 LAB — BASIC METABOLIC PANEL
Anion gap: 13 (ref 5–15)
BUN: 13 mg/dL (ref 8–23)
CO2: 28 mmol/L (ref 22–32)
Calcium: 8.1 mg/dL — ABNORMAL LOW (ref 8.9–10.3)
Chloride: 98 mmol/L (ref 98–111)
Creatinine, Ser: 0.59 mg/dL (ref 0.44–1.00)
GFR, Estimated: >60 mL/min (ref >60)
Glucose, Bld: 75 mg/dL (ref 70–99)
Potassium: 3 mmol/L — ABNORMAL LOW (ref 3.5–5.1)
Sodium: 139 mmol/L (ref 135–145)

## 2020-08-07 LAB — CBC
HCT: 31.2 % — ABNORMAL LOW (ref 36.0–46.0)
Hemoglobin: 10.4 g/dL — ABNORMAL LOW (ref 12.0–15.0)
MCH: 31.3 pg (ref 26.0–34.0)
MCHC: 33.3 g/dL (ref 30.0–36.0)
MCV: 94 fL (ref 80.0–100.0)
Platelets: 285 10*3/uL (ref 150–400)
RBC: 3.32 MIL/uL — ABNORMAL LOW (ref 3.87–5.11)
RDW: 14.5 % (ref 11.5–15.5)
WBC: 10.3 10*3/uL (ref 4.0–10.5)
nRBC: 0 % (ref 0.0–0.2)

## 2020-08-07 LAB — SARS CORONAVIRUS 2 (TAT 6-24 HRS): SARS Coronavirus 2: NEGATIVE

## 2020-08-07 LAB — URINE DRUG SCREEN, QUALITATIVE (ARMC ONLY)
Amphetamines, Ur Screen: NOT DETECTED
Barbiturates, Ur Screen: NOT DETECTED
Benzodiazepine, Ur Scrn: NOT DETECTED
Cannabinoid 50 Ng, Ur ~~LOC~~: NOT DETECTED
Cocaine Metabolite,Ur ~~LOC~~: NOT DETECTED
MDMA (Ecstasy)Ur Screen: NOT DETECTED
Methadone Scn, Ur: NOT DETECTED
Opiate, Ur Screen: NOT DETECTED
Phencyclidine (PCP) Ur S: NOT DETECTED
Tricyclic, Ur Screen: NOT DETECTED

## 2020-08-07 LAB — URINALYSIS, COMPLETE (UACMP) WITH MICROSCOPIC
Bilirubin Urine: NEGATIVE
Glucose, UA: NEGATIVE mg/dL
Ketones, ur: 20 mg/dL — AB
Leukocytes,Ua: NEGATIVE
Nitrite: NEGATIVE
Protein, ur: NEGATIVE mg/dL
Specific Gravity, Urine: 1.012 (ref 1.005–1.030)
pH: 7 (ref 5.0–8.0)

## 2020-08-07 LAB — CK: Total CK: 2201 U/L — ABNORMAL HIGH (ref 38–234)

## 2020-08-07 MED ORDER — POTASSIUM CHLORIDE CRYS ER 20 MEQ PO TBCR
40.0000 meq | EXTENDED_RELEASE_TABLET | Freq: Once | ORAL | Status: AC
  Administered 2020-08-07: 40 meq via ORAL

## 2020-08-07 NOTE — Progress Notes (Signed)
Pt. Son called and stated that the family wants a short term rehab for pt. When she D/C. Will follow up with case manager and provider.

## 2020-08-07 NOTE — Progress Notes (Signed)
PROGRESS NOTE  Heidi Fuentes  DOB: 1945/11/13  PCP: Alfonse Flavors, MD ZOX:[id number]  DOA: 08/06/2020  LOS: 1 day  Hospital Day: 2   Chief Complaint  Patient presents with   Failure To Thrive    Brief narrative: Heidi Fuentes is a 75 y.o. female with PMH significant for HTN, arthritis, lumbar spinal stenosis, chronic back pain, chronic pain syndrome, depression, fibromyalgia, esophageal dysmotility, GERD, hiatal hernia, hypothyroidism. Patient was brought to the ED on 7/5 as an involuntary commitment because of recurrent falls and inability to take care of herself at home.  7/2-7/4, she was hospitalized at [facility] for acute rhabdomyolysis, recommended to go to SNF which she refused.  Since discharge to home, she has had multiple falls and family's concern that she could seriously injure herself. In the ED, patient was hemodynamically stable Labs showed potassium low at 3.4, AST elevated to 22, ALT elevated to 175 CK was elevated to 4000  Started on IV fluid, admitted to hospitalist service.  Subjective: Patient was seen and examined this morning.  Pleasant elderly Caucasian female.  Propped up in bed.  Not in distress.  Alert, awake, oriented to place and person.  Agrees to rehab placement.  Assessment/Plan: Frequent falls/generalized weakness -Multifactorial: Advanced age, arthritis, chronic back pain, depression -PT eval.  SNF was recommended last hospitalization.  Rhabdomyolysis -Secondary to recurrent falls -Prior to last admission, she was on the floor for 2 days.  CK level improved with hydration at that time.  However with recurrent falls, she presented this time with CK level elevated again to more than 4000.  Trending down now with hydration.   Recent Labs  Lab 08/03/20 1614 08/03/20 1936 08/05/20 0606 08/06/20 1814 08/07/20 0413  CKTOTAL 3,113* 2,862* 937* 4,384* 2,201*   Hypokalemia -Potassium low at 3 today.  Replacement ordered.  Recheck  tomorrow.  Obtain magnesium and phosphorus level as well Recent Labs  Lab 08/03/20 1614 08/04/20 0412 08/05/20 0606 08/06/20 1814 08/07/20 0413  K 3.1* 3.8 4.3 3.4* 3.0*    Elevated transaminases -Probably due to rhabdomyolysis itself. Recent Labs  Lab 08/03/20 1614 08/04/20 0412 08/05/20 0606 08/06/20 1814  AST 212* 136* 84* 222*  ALT 178* 136* 121* 175*  ALKPHOS 45 37* 47 56  BILITOT 1.3* 0.7 0.7 1.7*  PROT 6.1* 4.7* 4.9* 6.3*  ALBUMIN 3.5 2.8* 2.8* 3.7   Depression. -We will continue Prozac and Wellbutrin SR. -Psychiatry consulted on admission  Bipolar disorder. -continue Lamictal.  Hypothyroidism. -continue Synthroid.  Asthma without exacerbation. -continue her inhalers.  Mobility: PT eval pending Code Status:   Code Status: Full Code  Nutritional status: Body mass index is 29.11 kg/m.     Diet:  Diet Order             Diet Heart Room service appropriate? Yes; Fluid consistency: Thin  Diet effective now                  DVT prophylaxis:  enoxaparin (LOVENOX) injection 40 mg Start: 08/06/20 2245   Antimicrobials: None Fluid: Currently normal saline 125 mill per hour Consultants: Psychiatry consult ordered Family Communication: None at bedside  Status is: Inpatient  Remains inpatient appropriate because: Needs IV hydration, placement  Dispo: The patient is from: Home              Anticipated d/c is to: Likely SNF candidate              Patient currently is not medically  stable to d/c.   Difficult to place patient No     Infusions:   sodium chloride 125 mL/hr at 08/07/20 0340    Scheduled Meds:  buPROPion  200 mg Oral BID   cholecalciferol  1,000 Units Oral Daily   diltiazem  120 mg Oral Daily   enoxaparin (LOVENOX) injection  40 mg Subcutaneous Q24H   FLUoxetine  40 mg Oral Daily   gabapentin  100 mg Oral QHS   lamoTRIgine  200 mg Oral BID   latanoprost  1 drop Both Eyes QHS   levothyroxine  50 mcg Oral Q0600   loratadine   10 mg Oral Daily   losartan  50 mg Oral Daily   mometasone-formoterol  2 puff Inhalation BID   montelukast  10 mg Oral Daily   multivitamin-lutein  1 capsule Oral BID   umeclidinium bromide  1 puff Inhalation Daily    Antimicrobials: Anti-infectives (From admission, onward)    None       PRN meds: acetaminophen **OR** acetaminophen, albuterol, fluticasone, ipratropium-albuterol, magnesium hydroxide, meclizine, melatonin, ondansetron **OR** ondansetron (ZOFRAN) IV, oxyCODONE, polyethylene glycol, zolpidem   Objective: Vitals:   08/07/20 0324 08/07/20 0752  BP: (!) 117/51 (!) 137/51  Pulse: 86 80  Resp: 20 18  Temp: 97.8 F (36.6 C) 97.6 F (36.4 C)  SpO2: 90% 97%    Intake/Output Summary (Last 24 hours) at 08/07/2020 1049 Last data filed at 08/07/2020 0928 Gross per 24 hour  Intake 1469.25 ml  Output --  Net 1469.25 ml   Filed Weights   08/06/20 1817 08/07/20 0324  Weight: 63.5 kg 67.6 kg   Weight change:  Body mass index is 29.11 kg/m.   Physical Exam: General exam: Pleasant, elderly Caucasian female.  Not in physical distress Skin: No rashes, lesions or ulcers. HEENT: Atraumatic, normocephalic, no obvious bleeding Lungs: Clear to auscultation bilaterally CVS: Regular rate and rhythm, no murmur GI/Abd soft, nontender, nondistended, bowel sound present CNS: Alert, awake, oriented to place and person Psychiatry: Mood appropriate Extremities: No pedal edema, no calf tenderness.  Multiple tender spots related to fibromyalgia  Data Review: I have personally reviewed the laboratory data and studies available.  Recent Labs  Lab 08/03/20 1614 08/04/20 0412 08/06/20 1814 08/07/20 0413  WBC 14.7* 10.4 13.6* 10.3  NEUTROABS 12.1*  --   --   --   HGB 12.0 10.3* 13.4 10.4*  HCT 36.6 31.6* 39.6 31.2*  MCV 94.8 94.9 91.5 94.0  PLT 303 272 373 285   Recent Labs  Lab 08/03/20 1614 08/04/20 0412 08/05/20 0606 08/06/20 1814 08/07/20 0413  NA 137 138 133* 138  139  K 3.1* 3.8 4.3 3.4* 3.0*  CL 101 106 101 95* 98  CO2 22 23 26 30 28   GLUCOSE 87 99 102* 85 75  BUN 26* 18 16 14 13   CREATININE 0.70 0.67 0.64 0.63 0.59  CALCIUM 8.5* 8.2* 8.1* 9.0 8.1*    F/u labs ordered Unresulted Labs (From admission, onward)     Start     Ordered   08/08/20 4128  Basic metabolic panel  Daily,   R      08/07/20 1047   08/08/20 0500  CBC with Differential/Platelet  Daily,   R      08/07/20 1047   08/08/20 0500  Magnesium  Tomorrow morning,   R        08/07/20 1047   08/08/20 0500  Phosphorus  Tomorrow morning,   R  08/07/20 1047   08/07/20 0500  CK  Daily,   STAT      08/06/20 2231            Signed, Terrilee Croak, MD [facility] 08/07/2020

## 2020-08-07 NOTE — Consult Note (Signed)
Consult completed on 7/5 and psychiatrically cleared.  IVC removed today as this provider met with her earlier and she denied suicidal/homicidal ideations, hallucinations, and substance abuse.  No threat to herself or others and agreeable to go to rehab.  Alert and oriented times 3, has a therapist who manages her mental health care.  Waylan Boga, PMHNP

## 2020-08-07 NOTE — Evaluation (Signed)
Physical Therapy Evaluation Patient Details Name: Heidi Fuentes MRN: [id number] DOB: 03-Aug-1945 Today's Date: 08/07/2020   History of Present Illness  Pt admitted for anemia with complaints of multiple falls. History includes bipolar, anemia, and recent hospital stay secondary to rhabdo.  Clinical Impression  Pt is a pleasant 75 year old pleasantly confused female who was admitted for anemia and multiple falls. Pt performs bed mobility with min assist and transfers with mod assist and RW.  Unsafe for ambulation at this time due to poor standing balance. Pt demonstrates deficits with strength/mobility/cognition. Would benefit from skilled PT to address above deficits and promote optimal return to PLOF; recommend transition to STR upon discharge from acute hospitalization. Pt agreeable to SNF at this time.     Follow Up Recommendations SNF    Equipment Recommendations  None recommended by PT    Recommendations for Other Services       Precautions / Restrictions Precautions Precautions: Fall Restrictions Weight Bearing Restrictions: No      Mobility  Bed Mobility Overal bed mobility: Needs Assistance Bed Mobility: Supine to Sit     Supine to sit: Min assist Sit to supine: Min assist   General bed mobility comments: needs assist for B LE and for trunkal elevation. ONce seated at EOB, able to sit with upright posture. Attempted lateral scoot at bedside with mod A    Transfers Overall transfer level: Needs assistance Equipment used: Rolling walker (2 wheeled) Transfers: Sit to/from [facility] Sit to Stand: Mod assist         General transfer comment: assist for anterior translation in order to transfer into upright posture. Needs hands on assist for static standing with frequent LOB in post direction with dynamic challenge.  Ambulation/Gait             General Gait Details: pre gait activities performed, however multiple LOB in post L lateral  direction with stepping in place with pt unable to self correct. Unsafe to perform further ambulation.  Stairs            Wheelchair Mobility    Modified Rankin (Stroke Patients Only)       Balance Overall balance assessment: History of Falls;Needs assistance Sitting-balance support: Feet supported Sitting balance-Leahy Scale: Good     Standing balance support: Bilateral upper extremity supported Standing balance-Leahy Scale: Poor                               Pertinent Vitals/Pain Pain Assessment: Faces Faces Pain Scale: Hurts little more Pain Location: B LEs and brusing Pain Descriptors / Indicators: Constant Pain Intervention(s): Limited activity within patient's tolerance;Repositioned    Home Living Family/patient expects to be discharged to:: Private residence Living Arrangements: Alone Available Help at Discharge: Family;Available PRN/intermittently Type of Home: House Home Access: Ramped entrance     Home Layout: One level Home Equipment: Walker - 2 wheels;Cane - single point;Shower seat;Wheelchair - manual      Prior Function Level of Independence: Independent with assistive device(s)   Gait / Transfers Assistance Needed: able to complete short distance ambulation with SPC at baseline, however since most recent falls has been utilizing WC and RW           Hand Dominance        Extremity/Trunk Assessment   Upper Extremity Assessment Upper Extremity Assessment: Generalized weakness (B UE grossly 4/5)    Lower Extremity Assessment Lower Extremity Assessment: Generalized  weakness (B LE grossly 3+/5)       Communication   Communication: No difficulties  Cognition Arousal/Alertness: Awake/alert Behavior During Therapy: WFL for tasks assessed/performed Overall Cognitive Status: Within Functional Limits for tasks assessed                                        General Comments      Exercises Other  Exercises Other Exercises: supine ther-ex performed on B LE including AP, SLRs, hip add squeezes and LAQ. All ther-ex performed x 10 reps with cga and safe technique   Assessment/Plan    PT Assessment Patient needs continued PT services  PT Problem List Decreased strength;Decreased mobility;Decreased activity tolerance;Decreased balance;Decreased knowledge of use of DME       PT Treatment Interventions DME instruction;Therapeutic exercise;Gait training;Balance training;Stair training;Neuromuscular re-education;Functional mobility training;Therapeutic activities;Patient/family education    PT Goals (Current goals can be found in the Care Plan section)  Acute Rehab PT Goals Patient Stated Goal: Get stronger and go home PT Goal Formulation: With patient Time For Goal Achievement: 08/21/20 Potential to Achieve Goals: Fair    Frequency Min 2X/week   Barriers to discharge        Co-evaluation               AM-PAC PT "6 Clicks" Mobility  Outcome Measure Help needed turning from your back to your side while in a flat bed without using bedrails?: None Help needed moving from lying on your back to sitting on the side of a flat bed without using bedrails?: A Little Help needed moving to and from a bed to a chair (including a wheelchair)?: A Lot Help needed standing up from a chair using your arms (e.g., wheelchair or bedside chair)?: A Lot Help needed to walk in hospital room?: Total Help needed climbing 3-5 steps with a railing? : Total 6 Click Score: 13    End of Session Equipment Utilized During Treatment: Gait belt Activity Tolerance: Patient limited by pain Patient left: in bed;with bed alarm set (with sitter present) Nurse Communication: Mobility status PT Visit Diagnosis: Unsteadiness on feet (R26.81);Other abnormalities of gait and mobility (R26.89);Muscle weakness (generalized) (M62.81);History of falling (Z91.81)    Time: 1132-1200 PT Time Calculation (min) (ACUTE  ONLY): 28 min   Charges:   PT Evaluation $PT Eval Low Complexity: 1 Low PT Treatments $Therapeutic Exercise: 8-22 mins        Greggory Stallion, PT, DPT [phone]   Heidi Fuentes 08/07/2020, 1:10 PM

## 2020-08-07 NOTE — ED Notes (Signed)
Hourly rounding performed, patient currently awake in room. Patient has no complaints at this time. Q15 minute rounds and monitoring via Engineer, drilling to continue.

## 2020-08-07 NOTE — BH Assessment (Signed)
Comprehensive Clinical Assessment (CCA) Note  08/07/2020 Heidi Fuentes [id number] Recommendations for Services/Supports/Treatments Psych NP Kennyth Lose T. determined pt  is psych cleared and does not meet psychiatric inpatient criteria. Pt to be referred to social work for a rehab facility.   Pt seen with "I'm not feeling the best." Pt presented with unremarkable speech, with a euthymic presentation. Pt reported that she'd initially refused rehab because she thought that she'd do fine without it. However pt expressed that she understands that rehab is necessary and agreed to go to a rehab facility. The pt explained that she is unable to walk independently or take care of her ADLS. The pt reported that she'd spent days on the floor after a fall and was found by her church group. The pt. made and effort to be personable throughout the assessment. The patient denied current SI, HI or AV/H.   Chief Complaint:  Chief Complaint  Patient presents with   Failure To Thrive   Visit Diagnosis: Chronic pain syndrome   Bipolar disorder ([place])   Anemia   Fall   Rhabdomyolysis      CCA Screening, Triage and Referral (STR)  Patient Reported Information How did you hear about [place]? Legal System  Referral name: No data recorded Referral phone number: No data recorded  Whom do you see for routine medical problems? No data recorded Practice/Facility Name: No data recorded Practice/Facility Phone Number: No data recorded Name of Contact: No data recorded Contact Number: No data recorded Contact Fax Number: No data recorded Prescriber Name: No data recorded Prescriber Address (if known): No data recorded  What Is the Reason for Your Visit/Call Today? Failure to thrive  How Long Has This Been Causing You Problems? <Week  What Do You Feel Would Help You the Most Today? -- (physical rehabilitation)   Have You Recently Been in Any Inpatient Treatment (Hospital/Detox/Crisis Center/28-Day Program)? No data  recorded Name/Location of Program/Hospital:No data recorded How Long Were You There? No data recorded When Were You Discharged? No data recorded  Have You Ever Received Services From [facility] Before? No data recorded Who Do You See at [facility]? No data recorded  Have You Recently Had Any Thoughts About Hurting Yourself? No  Are You Planning to Commit Suicide/Harm Yourself At This time? No   Have you Recently Had Thoughts About [place]? No  Explanation: No data recorded  Have You Used Any Alcohol or Drugs in the Past 24 Hours? No  How Long Ago Did You Use Drugs or Alcohol? No data recorded What Did You Use and How Much? No data recorded  Do You Currently Have a Therapist/Psychiatrist? No  Name of Therapist/Psychiatrist: No data recorded  Have You Been Recently Discharged From Any Office Practice or Programs? No  Explanation of Discharge From Practice/Program: No data recorded    CCA Screening Triage Referral Assessment Type of Contact: Face-to-Face  Is this Initial or Reassessment? No data recorded Date Telepsych consult ordered in [facility]:  No data recorded Time Telepsych consult ordered in [facility]:  No data recorded  Patient Reported Information Reviewed? No data recorded Patient Left Without Being Seen? No data recorded Reason for Not Completing Assessment: No data recorded  Collateral Involvement: No data recorded  Does Patient Have a [place]? No data recorded Name and Contact of Legal Guardian: No data recorded If Minor and Not Living with Parent(s), Who has Custody? No data recorded Is CPS involved or ever been involved? Never  Is APS  involved or ever been involved? Never   Patient Determined To Be At Risk for Harm To Self or Others Based on Review of Patient Reported Information or Presenting Complaint? No  Method: No data recorded Availability of Means: No data recorded Intent: No data recorded Notification Required: No  data recorded Additional Information for Danger to Others Potential: No data recorded Additional Comments for Danger to Others Potential: No data recorded Are There Guns or Other Weapons in Your Home? No data recorded Types of Guns/Weapons: No data recorded Are These Weapons Safely Secured?                            No data recorded Who Could Verify You Are Able To Have These Secured: No data recorded Do You Have any Outstanding Charges, Pending Court Dates, Parole/Probation? No data recorded Contacted To Inform of Risk of Harm To Self or Others: No data recorded  Location of Assessment: [facility] ED   Does Patient Present under Involuntary Commitment? Yes  IVC Papers Initial File Date: No data recorded  [place] of Residence: Other (Comment) [facility])   Patient Currently Receiving the Following Services: Medication Management   Determination of Need: Emergent (2 hours)   Options For Referral: Other: Comment (Per Lynder Parents., NP pt is psych cleared and will be referred to social work.)     CCA Biopsychosocial Intake/Chief Complaint:  No data recorded Current Symptoms/Problems: No data recorded  Patient Reported Schizophrenia/Schizoaffective Diagnosis in Past: No   Strengths: Pt is able to communicate her needs.  Preferences: No data recorded Abilities: No data recorded  Type of Services Patient Feels are Needed: No data recorded  Initial Clinical Notes/Concerns: No data recorded  Mental Health Symptoms Depression:   Worthlessness   Duration of Depressive symptoms:  Greater than two weeks   Mania:   None   Anxiety:    None   Psychosis:   None   Duration of Psychotic symptoms:  N/A   Trauma:   None   Obsessions:   None   Compulsions:   None   Inattention:   None   Hyperactivity/Impulsivity:   None   Oppositional/Defiant Behaviors:   None   Emotional Irregularity:   None   Other Mood/Personality Symptoms:  No data recorded   Mental  Status Exam Appearance and self-care  Stature:   Average   Weight:   Overweight   Clothing:   Casual   Grooming:   Neglected   Cosmetic use:   None   Posture/gait:   Normal   Motor activity:   Not Remarkable   Sensorium  Attention:  No data recorded  Concentration:   Normal   Orientation:   X5   Recall/memory:   Normal   Affect and Mood  Affect:   Appropriate   Mood:   Euthymic   Relating  Eye contact:   Normal   Facial expression:   Responsive   Attitude toward examiner:   Cooperative   Thought and Language  Speech flow:  Clear and Coherent   Thought content:   Appropriate to Mood and Circumstances   Preoccupation:   None   Hallucinations:   None   Organization:  No data recorded  [employer] of Knowledge:   Average   Intelligence:   Average   Abstraction:   Normal   Judgement:   Good   Reality Testing:   Adequate   Insight:   Present;  Uses connections   Decision Making:   Normal   Social Functioning  Social Maturity:   Isolates   Social Judgement:   Normal   Stress  Stressors:   Illness   Coping Ability:   Deficient supports   Skill Deficits:   Self-care; Activities of daily living   Supports:   Church; Family     Religion: Religion/Spirituality Are You A Religious Person?:  Special educational needs teacher)  Leisure/Recreation: Leisure / Recreation Do You Have Hobbies?: No  Exercise/Diet: Exercise/Diet Do You Exercise?: No Have You Gained or Lost A Significant Amount of Weight in the Past Six Months?: No Do You Follow a Special Diet?: No Do You Have Any Trouble Sleeping?: No   CCA Employment/Education Employment/Work Situation: Employment / Work Situation Employment Situation: Retired Social research officer, government has Been Impacted by Current Illness: No Has Patient ever Been in Passenger transport manager?: No  Education: Education Is Patient Currently Attending School?: No Did You Have An Individualized Education Program  (IIEP): No Did You Have Any Difficulty At [employer]?: No Patient's Education Has Been Impacted by Current Illness: No   CCA Family/Childhood History Family and Relationship History: Family history Marital status: Divorced Divorced, when?:  Special educational needs teacher) What types of issues is patient dealing with in the relationship?:  (Pt is divorced) Does patient have children?: Yes How many children?: 2 How is patient's relationship with their children?: Pt has good relationships with her children  Childhood History:  Childhood History By whom was/is the patient raised?:  ([place]) Did patient suffer any verbal/emotional/physical/sexual abuse as a child?: No Did patient suffer from severe childhood neglect?: No Was the patient ever a victim of a crime or a disaster?: No Witnessed domestic violence?: No Has patient been affected by domestic violence as an adult?: No  Child/Adolescent Assessment:     CCA Substance Use Alcohol/Drug Use: Alcohol / Drug Use Pain Medications: Please see MAR Prescriptions: Please see MAR Over the Counter: Please see MAR History of alcohol / drug use?: No history of alcohol / drug abuse Longest period of sobriety (when/how long): Pt denies substance abuse issues                         ASAM's:  Six Dimensions of Multidimensional Assessment  Dimension 1:  Acute Intoxication and/or Withdrawal Potential:      Dimension 2:  Biomedical Conditions and Complications:      Dimension 3:  Emotional, Behavioral, or Cognitive Conditions and Complications:     Dimension 4:  Readiness to Change:     Dimension 5:  Relapse, Continued use, or Continued Problem Potential:     Dimension 6:  Recovery/Living Environment:     ASAM Severity Score:    ASAM Recommended Level of Treatment:     Substance use Disorder (SUD)    Recommendations for Services/Supports/Treatments:    DSM5 Diagnoses: Patient Active Problem List   Diagnosis Date Noted   Rhabdomyolysis 08/03/2020    Elevated LFTs 08/03/2020   Unilateral primary osteoarthritis, left hip 03/26/2020   Pedal edema 03/18/2020   Pain in joint involving ankle and foot 12/20/2019   Bronchiectasis without complication ([place]) 93/23/5573   DOE (dyspnea on exertion) 07/27/2019   Dysphagia 07/25/2018   Leukocytosis 07/25/2018   Closed fracture of left orbital floor ([place]) 07/25/2018   Exudative age-related macular degeneration, bilateral, with active choroidal neovascularization ([place]) 07/25/2018   CAP (community acquired pneumonia) due to MRSA (methicillin resistant Staphylococcus aureus) ([place]) 02/13/2018   Asthma, chronic, unspecified asthma severity,  with acute exacerbation 02/04/2018   Cough 11/02/2017   Gastroesophageal reflux disease without esophagitis 10/30/2017   Other specified glaucoma 42/68/3419   Uncomplicated asthma 62/22/9798   Chronic rhinitis 10/30/2017   Long-term current use of opiate analgesic 03/10/2017   Essential hypertension 02/17/2017   On long term drug therapy 02/08/2017   Chronic narcotic use 02/04/2017   Hiatal hernia 02/03/2017   Hypokalemia 09/06/2015   Prolonged Q-T interval on ECG 09/06/2015   Fall    Iron deficiency anemia, unspecified 12/15/2013   Schatzki's ring 08/17/2013   Anemia 08/17/2013   Encounter for screening colonoscopy 08/17/2013   Other constipation 07/10/2013   Chronic pain syndrome 07/10/2013   Bipolar disorder ([place]) 07/10/2013   Other specified hypothyroidism 07/09/2013   Colitis 07/09/2013   Asthma 07/09/2013    Shrey Boike R Peggi Yono, LCAS

## 2020-08-07 NOTE — ED Notes (Signed)
Hourly rounding performed, patient currently asleep in room. Patient has no complaints at this time. Q15 minute rounds and monitoring via Rover and Officer to continue. 

## 2020-08-07 NOTE — Evaluation (Signed)
Occupational Therapy Evaluation Patient Details Name: Heidi Fuentes MRN: [id number] DOB: [birth year] Today's Date: 08/07/2020    History of Present Illness Heidi Fuentes admitted for anemia with complaints of multiple falls. History includes bipolar, anemia, and recent hospital stay secondary to rhabdo.   Clinical Impression   Heidi Fuentes was seen for OT evaluation this date. Prior to hospital admission, Heidi Fuentes was MOD I for ADL management in the home. Heidi Fuentes reports that she lives alone and has multiple falls in past few weeks. Heidi Fuentes endorses concerns over weakness and her ability to get around at home. She reports that just a few days PTA she was up shopping with a friend. She is noted with mild cognitive difficulty during session, at one point stating she is at her home, but is easily re-directable. Currently Heidi Fuentes demonstrates impairments including BLE pain, decreased activity tolerance, generalized weakness, and decreased safety awareness, which functionally limit her ability to perform ADL/self-care tasks. Heidi Fuentes currently requires MOD-MAX A for bed level LB ADL management.  Heidi Fuentes would benefit from skilled OT services to address noted impairments and functional limitations (see below for any additional details) in order to maximize safety and independence while minimizing falls risk and caregiver burden. Upon hospital discharge, recommend STR to maximize Heidi Fuentes safety and return to PLOF.      Follow Up Recommendations  SNF;Supervision/Assistance - 24 hour    Equipment Recommendations  3 in 1 bedside commode    Recommendations for Other Services       Precautions / Restrictions Precautions Precautions: Fall Restrictions Weight Bearing Restrictions: No      Mobility Bed Mobility Overal bed mobility: Needs Assistance Bed Mobility: Supine to Sit     Supine to sit: Min assist Sit to supine: Min assist   General bed mobility comments: Deferred. Heidi Fuentes declines OOB/EOB 2/2 pain this date.    Transfers Overall transfer  level: Needs assistance Equipment used: Rolling walker (2 wheeled) Transfers: Sit to/from [facility] Sit to Stand: Mod assist         General transfer comment: Deferred.    Balance Overall balance assessment: History of Falls;Needs assistance Sitting-balance support: Feet supported Sitting balance-Leahy Scale: Good     Standing balance support: Bilateral upper extremity supported Standing balance-Leahy Scale: Poor                             ADL either performed or assessed with clinical judgement   ADL Overall ADL's : Needs assistance/impaired                                       General ADL Comments: Heidi Fuentes is functionally limited by increased pain in BLE, generalized weakness, decreased activity tolerance, & decreased safety awareness with questionalble impaired cognition this date. Heidi Fuentes declines OOB/EOB with OT this date 2/2 BLE pain. Per Heidi Fuentes note Heidi Fuentes was unsafe to attempt ambulation 2/2 instability. She currently requires MOD-MAX assist for toileting, LB dressing, and LB bathing at bed level. Set-up for UB ADL management at bed level including self-feeding and grooming.     Vision Baseline Vision/History: Wears glasses Wears Glasses: At all times Patient Visual Report: No change from baseline       Perception     Praxis      Pertinent Vitals/Pain Pain Assessment: Faces Faces Pain Scale: Hurts even more Pain Location: BLE Pain Descriptors / Indicators:  Discomfort;Sore Pain Intervention(s): Limited activity within patient's tolerance;Monitored during session     Hand Dominance Right   Extremity/Trunk Assessment Upper Extremity Assessment Upper Extremity Assessment: Generalized weakness (No focal weakness appreciated. BUE AROM WFL, strength grossly 4/5 t/o.)   Lower Extremity Assessment Lower Extremity Assessment: Generalized weakness;Defer to Heidi Fuentes evaluation   Cervical / Trunk Assessment Cervical / Trunk Assessment:  Normal   Communication Communication Communication: No difficulties   Cognition Arousal/Alertness: Awake/alert Behavior During Therapy: WFL for tasks assessed/performed Overall Cognitive Status: No family/caregiver present to determine baseline cognitive functioning                                 General Comments: Heidi Fuentes oriented to self, limited date, and limited situation. she states place as "my house" but with cueing is able to identify that she is at [facility]. No family/caregiver available to determine baseline cognitive function.   General Comments       Exercises Exercises: Other exercises Other Exercises Other Exercises: supine ther-ex performed on B LE including AP, SLRs, hip add squeezes and LAQ. All ther-ex performed x 10 reps with cga and safe technique Other Exercises: Heidi Fuentes educated on role of OT in acute setting, importance of functional activity during hospital admission, and falls prevention strategies for home and hospital. Heidi Fuentes agreeable to further functional activity at next session, but declines OOB/EOB this date 2/2 pain.   Shoulder Instructions      Home Living Family/patient expects to be discharged to:: Private residence Living Arrangements: Alone Available Help at Discharge: Family;Available PRN/intermittently Type of Home: House Home Access: Ramped entrance     Home Layout: One level     Bathroom Shower/Tub: Teacher, early years/pre: Standard Bathroom Accessibility: No   Home Equipment: Environmental consultant - 2 wheels;Cane - single point;Shower seat;Wheelchair - manual          Prior Functioning/Environment Level of Independence: Independent with assistive device(s)  Gait / Transfers Assistance Needed: able to complete short distance ambulation with SPC at baseline, however since most recent falls has been utilizing WC and RW ADL's / Homemaking Assistance Needed: Heidi Fuentes reports she is able to complete bathing, dressing, and basic self-care tasks  independenty.  She endorses completing grocery shopping and going out with friends in the week PTA, however, question if reiliable historian as she demonstrates mild confusion during OT evaluation.            OT Problem List: Decreased strength;Decreased coordination;Pain;Decreased range of motion;Decreased cognition;Decreased activity tolerance;Decreased safety awareness;Impaired balance (sitting and/or standing);Decreased knowledge of use of DME or AE      OT Treatment/Interventions: Self-care/ADL training;Therapeutic exercise;Therapeutic activities;DME and/or AE instruction;Patient/family education;Balance training;Energy conservation;Cognitive remediation/compensation    OT Goals(Current goals can be found in the care plan section) Acute Rehab OT Goals Patient Stated Goal: Get stronger and go home OT Goal Formulation: With patient Time For Goal Achievement: 08/21/20 Potential to Achieve Goals: Fair ADL Goals Heidi Fuentes Will Perform Grooming: sitting;with modified independence Heidi Fuentes Will Perform Lower Body Dressing: sit to/from stand;with min assist;with adaptive equipment Heidi Fuentes Will Transfer to Toilet: stand pivot transfer;bedside commode;with min assist Heidi Fuentes Will Perform Toileting - Clothing Manipulation and hygiene: sit to/from stand;with min assist;with adaptive equipment  OT Frequency: Min 2X/week   Barriers to D/C: Decreased caregiver support          Co-evaluation              AM-PAC OT "6 Clicks"  Daily Activity     Outcome Measure Help from another person eating meals?: A Little Help from another person taking care of personal grooming?: A Little Help from another person toileting, which includes using toliet, bedpan, or urinal?: A Lot Help from another person bathing (including washing, rinsing, drying)?: A Lot Help from another person to put on and taking off regular upper body clothing?: A Little Help from another person to put on and taking off regular lower body clothing?:  A Lot 6 Click Score: 15   End of Session    Activity Tolerance: Patient limited by pain Patient left: in bed;with call bell/phone within reach;with bed alarm set;with nursing/sitter in room  OT Visit Diagnosis: Other abnormalities of gait and mobility (R26.89);Muscle weakness (generalized) (M62.81);Pain Pain - Right/Left:  (Both) Pain - part of body: Knee;Leg;Ankle and joints of foot                Time: 1313-1336 OT Time Calculation (min): 23 min Charges:  OT General Charges $OT Visit: 1 Visit OT Evaluation $OT Eval Moderate Complexity: 1 Mod OT Treatments $Self Care/Home Management : 8-22 mins  Shara Blazing, M.S., OTR/L Ascom: [phone] 08/07/20, 1:54 PM

## 2020-08-07 NOTE — TOC Progression Note (Signed)
Transition of Care [facility]) - Progression Note    Patient Details  Name: Heidi Fuentes MRN: [id number] Date of Birth: 30-May-1945  Transition of Care [facility]) CM/SW [place], RN Phone Number: 08/07/2020, 3:50 PM  Clinical Narrative:   Spoke to patient's son, he authorized SNF bed search, but would like  patient to go to inpatient rehab at [facility], as this is close to him and he is familiar with the facility.  Care team advised, awaiting feedback.         Expected Discharge Plan and Services                                                 Social Determinants of Health (SDOH) Interventions    Readmission Risk Interventions No flowsheet data found.

## 2020-08-07 NOTE — ED Notes (Signed)
Pt reports taking oxycodone and ambien at night to assist with pain and sleep.

## 2020-08-07 NOTE — ED Notes (Signed)
Pt transported to floor now, pt belongings set with pt on bed.

## 2020-08-07 NOTE — NC FL2 (Signed)
[place] LEVEL OF CARE SCREENING TOOL     IDENTIFICATION  Patient Name: Heidi Fuentes Birthdate: 12-30-1945 Sex: female Admission Date (Current Location): 08/06/2020  [facility] and [place] Number:  Engineering geologist and Address:  [facility], [address], [place], [place] [address]      Provider Number:    Attending Physician Name and Address:  Terrilee Croak, MD  Relative Name and Phone Number:  Tebbetts,Phillip Pandora Leiter)   [phone] [facility])    Current Level of Care: Hospital Recommended Level of Care: [place] Prior Approval Number:    Date Approved/Denied:   PASRR Number: PASRR pending  Discharge Plan: SNF    Current Diagnoses: Patient Active Problem List   Diagnosis Date Noted   Bipolar affective disorder, depressed, mild ([place]) 08/07/2020   Rhabdomyolysis 08/03/2020   Elevated LFTs 08/03/2020   Unilateral primary osteoarthritis, left hip 03/26/2020   Pedal edema 03/18/2020   Pain in joint involving ankle and foot 12/20/2019   Bronchiectasis without complication ([place]) 74/09/1446   DOE (dyspnea on exertion) 07/27/2019   Dysphagia 07/25/2018   Leukocytosis 07/25/2018   Closed fracture of left orbital floor ([place]) 07/25/2018   Exudative age-related macular degeneration, bilateral, with active choroidal neovascularization ([place]) 07/25/2018   CAP (community acquired pneumonia) due to MRSA (methicillin resistant Staphylococcus aureus) ([place]) 02/13/2018   Asthma, chronic, unspecified asthma severity, with acute exacerbation 02/04/2018   Cough 11/02/2017   Gastroesophageal reflux disease without esophagitis 10/30/2017   Other specified glaucoma 18/56/3149   Uncomplicated asthma 70/26/3785   Chronic rhinitis 10/30/2017   Long-term current use of opiate analgesic 03/10/2017   Essential hypertension 02/17/2017   On long term drug therapy 02/08/2017   Chronic narcotic use 02/04/2017   Hiatal hernia 02/03/2017    Hypokalemia 09/06/2015   Prolonged Q-T interval on ECG 09/06/2015   Fall    Iron deficiency anemia, unspecified 12/15/2013   Schatzki's ring 08/17/2013   Anemia 08/17/2013   Encounter for screening colonoscopy 08/17/2013   Other constipation 07/10/2013   Chronic pain syndrome 07/10/2013   Other specified hypothyroidism 07/09/2013   Colitis 07/09/2013   Asthma 07/09/2013    Orientation RESPIRATION BLADDER Height & Weight     Self, Time, Situation, Place  Normal Continent Weight: 67.6 kg Height:  5' (152.4 cm)  BEHAVIORAL SYMPTOMS/MOOD NEUROLOGICAL BOWEL NUTRITION STATUS  Other (Comment) (Bipolar)   Incontinent Diet (Heart)  AMBULATORY STATUS COMMUNICATION OF NEEDS Skin   Limited Assist (Moderate assist sit to stand) Verbally Skin abrasions (abrasions on foot and leg)                       Personal Care Assistance Level of Assistance  Bathing, Feeding, Dressing Bathing Assistance: Limited assistance Feeding assistance: Limited assistance Dressing Assistance: Limited assistance     Functional Limitations Info  Sight, Hearing, Speech Sight Info: Adequate Hearing Info: Adequate Speech Info: Adequate    SPECIAL CARE FACTORS FREQUENCY  PT (By licensed PT), OT (By licensed OT)     PT Frequency: Min 5x weekly OT Frequency: Min 5x weekly            Contractures Contractures Info: Not present    Additional Factors Info  Allergies, Code Status Code Status Info: FULL CODE Allergies Info: Abilify (aripiprazole), Geodon (ziprasidone Hcl), Quetiapine, Sulfa Antibiotics, Trazodone And Nefazodone           Current Medications (08/07/2020):  This is the current hospital active medication list Current Facility-Administered  Medications  Medication Dose Route Frequency Provider Last Rate Last Admin   0.9 %  sodium chloride infusion   Intravenous Continuous Mansy, Arvella Merles, MD 125 mL/hr at 08/07/20 0340 New Bag at 08/07/20 0340   acetaminophen (TYLENOL) tablet 650 mg  650 mg  Oral Q6H PRN Mansy, Jan A, MD       Or   acetaminophen (TYLENOL) suppository 650 mg  650 mg Rectal Q6H PRN Mansy, Jan A, MD       albuterol (PROVENTIL) (2.5 MG/3ML) 0.083% nebulizer solution 2.5 mg  2.5 mg Inhalation Q6H PRN Mansy, Jan A, MD       buPROPion [facility]) 12 hr tablet 200 mg  200 mg Oral BID Caroline Sauger, NP   200 mg at 08/07/20 0954   cholecalciferol (VITAMIN D3) tablet 1,000 Units  1,000 Units Oral Daily Mansy, Jan A, MD   1,000 Units at 08/07/20 0917   diltiazem (CARDIZEM CD) 24 hr capsule 120 mg  120 mg Oral Daily Mansy, Jan A, MD   120 mg at 08/07/20 0000   enoxaparin (LOVENOX) injection 40 mg  40 mg Subcutaneous Q24H Mansy, Jan A, MD   40 mg at 08/07/20 0002   FLUoxetine (PROZAC) capsule 40 mg  40 mg Oral Daily Caroline Sauger, NP   40 mg at 08/07/20 0917   fluticasone (FLONASE) 50 MCG/ACT nasal spray 2 spray  2 spray Each Nare Daily PRN Mansy, Jan A, MD       gabapentin (NEURONTIN) capsule 100 mg  100 mg Oral QHS Caroline Sauger, NP   100 mg at 08/06/20 2358   ipratropium-albuterol (DUONEB) 0.5-2.5 (3) MG/3ML nebulizer solution 3 mL  3 mL Inhalation Q6H PRN Mansy, Jan A, MD       lamoTRIgine (LAMICTAL) tablet 200 mg  200 mg Oral BID Mansy, Jan A, MD   200 mg at 08/07/20 1115   latanoprost (XALATAN) 0.005 % ophthalmic solution 1 drop  1 drop Both Eyes QHS Mansy, Jan A, MD   1 drop at 08/07/20 0001   levothyroxine (SYNTHROID) tablet 50 mcg  50 mcg Oral Q0600 Mansy, Jan A, MD   50 mcg at 08/07/20 0610   loratadine (CLARITIN) tablet 10 mg  10 mg Oral Daily Mansy, Jan A, MD   10 mg at 08/07/20 6213   losartan (COZAAR) tablet 50 mg  50 mg Oral Daily Mansy, Jan A, MD   50 mg at 08/07/20 0865   magnesium hydroxide (MILK OF MAGNESIA) suspension 30 mL  30 mL Oral Daily PRN Mansy, Jan A, MD       meclizine (ANTIVERT) tablet 25 mg  25 mg Oral TID PRN Mansy, Jan A, MD       melatonin tablet 5 mg  5 mg Oral QHS PRN Caroline Sauger, NP       mometasone-formoterol  [facility]) 200-5 MCG/ACT inhaler 2 puff  2 puff Inhalation BID Mansy, Jan A, MD   2 puff at 08/07/20 0955   montelukast (SINGULAIR) tablet 10 mg  10 mg Oral Daily Mansy, Jan A, MD   10 mg at 08/07/20 7846   multivitamin-lutein (OCUVITE-LUTEIN) capsule 1 capsule  1 capsule Oral BID Mansy, Jan A, MD   1 capsule at 08/07/20 0000   ondansetron (ZOFRAN) tablet 4 mg  4 mg Oral Q6H PRN Mansy, Jan A, MD       Or   ondansetron [facility]) injection 4 mg  4 mg Intravenous Q6H PRN Mansy, Arvella Merles, MD  oxyCODONE (Oxy IR/ROXICODONE) immediate release tablet 10 mg  10 mg Oral Q6H PRN Mansy, Jan A, MD   10 mg at 08/07/20 1506   polyethylene glycol (MIRALAX / GLYCOLAX) packet 17 g  17 g Oral Daily PRN Mansy, Jan A, MD       umeclidinium bromide (INCRUSE ELLIPTA) 62.5 MCG/INH 1 puff  1 puff Inhalation Daily Mansy, Jan A, MD       zolpidem (AMBIEN) tablet 5 mg  5 mg Oral QHS PRN Mansy, Jan A, MD   5 mg at 08/07/20 0017     Discharge Medications: Please see discharge summary for a list of discharge medications.  Relevant Imaging Results:  Relevant Lab Results:   Additional Information [id number]  Pete Pelt, RN

## 2020-08-08 DIAGNOSIS — W19XXXA Unspecified fall, initial encounter: Secondary | ICD-10-CM

## 2020-08-08 LAB — CBC WITH DIFFERENTIAL/PLATELET
Abs Immature Granulocytes: 0.07 10*3/uL (ref 0.00–0.07)
Basophils Absolute: 0 10*3/uL (ref 0.0–0.1)
Basophils Relative: 0 %
Eosinophils Absolute: 0.7 10*3/uL — ABNORMAL HIGH (ref 0.0–0.5)
Eosinophils Relative: 8 %
HCT: 28.2 % — ABNORMAL LOW (ref 36.0–46.0)
Hemoglobin: 9.3 g/dL — ABNORMAL LOW (ref 12.0–15.0)
Immature Granulocytes: 1 %
Lymphocytes Relative: 18 %
Lymphs Abs: 1.6 10*3/uL (ref 0.7–4.0)
MCH: 31.2 pg (ref 26.0–34.0)
MCHC: 33 g/dL (ref 30.0–36.0)
MCV: 94.6 fL (ref 80.0–100.0)
Monocytes Absolute: 1 10*3/uL (ref 0.1–1.0)
Monocytes Relative: 11 %
Neutro Abs: 5.4 10*3/uL (ref 1.7–7.7)
Neutrophils Relative %: 62 %
Platelets: 249 10*3/uL (ref 150–400)
RBC: 2.98 MIL/uL — ABNORMAL LOW (ref 3.87–5.11)
RDW: 14.9 % (ref 11.5–15.5)
WBC: 8.7 10*3/uL (ref 4.0–10.5)
nRBC: 0 % (ref 0.0–0.2)

## 2020-08-08 LAB — BASIC METABOLIC PANEL
Anion gap: 4 — ABNORMAL LOW (ref 5–15)
BUN: 10 mg/dL (ref 8–23)
CO2: 28 mmol/L (ref 22–32)
Calcium: 7.8 mg/dL — ABNORMAL LOW (ref 8.9–10.3)
Chloride: 105 mmol/L (ref 98–111)
Creatinine, Ser: 0.6 mg/dL (ref 0.44–1.00)
GFR, Estimated: >60 mL/min (ref >60)
Glucose, Bld: 101 mg/dL — ABNORMAL HIGH (ref 70–99)
Potassium: 3.6 mmol/L (ref 3.5–5.1)
Sodium: 137 mmol/L (ref 135–145)

## 2020-08-08 LAB — MAGNESIUM: Magnesium: 1.8 mg/dL (ref 1.7–2.4)

## 2020-08-08 LAB — PHOSPHORUS: Phosphorus: 2.4 mg/dL — ABNORMAL LOW (ref 2.5–4.6)

## 2020-08-08 LAB — CK: Total CK: 668 U/L — ABNORMAL HIGH (ref 38–234)

## 2020-08-08 MED ORDER — MAGNESIUM SULFATE IN D5W 1-5 GM/100ML-% IV SOLN
1.0000 g | Freq: Once | INTRAVENOUS | Status: AC
  Administered 2020-08-08: 1 g via INTRAVENOUS
  Filled 2020-08-08: qty 100

## 2020-08-08 MED ORDER — POTASSIUM PHOSPHATES 15 MMOLE/5ML IV SOLN
10.0000 mmol | Freq: Once | INTRAVENOUS | Status: AC
  Administered 2020-08-08: 10 mmol via INTRAVENOUS
  Filled 2020-08-08: qty 3.33

## 2020-08-08 NOTE — Progress Notes (Signed)
OT Cancellation Note  Patient Details Name: PATINA SPANIER MRN: [id number] DOB: 1945/12/12   Cancelled Treatment:    Reason Eval/Treat Not Completed: Other (comment). Upon attempt, MD in room with pt/family. Will re-attempt OT tx at later date/time as pt is available and medically appropriate.   Hanley Hays, MPH, MS, OTR/L ascom [phone] 08/08/20, 11:17 AM

## 2020-08-08 NOTE — Progress Notes (Addendum)
PROGRESS NOTE  Heidi Fuentes  DOB: 12-27-1945  PCP: Alfonse Flavors, MD KNL:[id number]  DOA: 08/06/2020  LOS: 2 days  Hospital Day: 3   Chief Complaint  Patient presents with   Failure To Thrive    Brief narrative: Heidi Fuentes is a 75 y.o. female with PMH of Bipolar Depression and Chronic Pain Syndrome on multiple psychiatric and pain medications (Lamictal, Bupropion, Prozac, Anti-Histamines, Gabapentin, Tizanidine, and Oxycodone) who was brought to the ED on 7/5 by her son after experiencing an acute manic episode x 1 week after she abruptly stopped taking her medications.  She was recently treated for an acute asthma exacerbation with 2 weeks of Prednisone.    7/2-7/5 Admitted to [facility] for weakness and dehydration found to have mild rhabdomyolysis with CK 4000.  Patient refused SNF and was discharged home. 7/5 Re-admitted to [facility].  Subjective: Patient was seen and examined this morning.  Pleasant elderly Caucasian female.  Propped up in bed.  Not in distress.  Alert, awake, oriented to place and person.  Agrees to rehab placement.  Assessment/Plan:  Acute Encephalopathy secondary to Polypharmacy, resolved: Mental status is at baseline. - The concurrent use of Bupropion and SSRI can increase risk of seizures - however son and patient do not report any witnessed seizure like activity.  The combo can also increase the risk of panic and delirium - which we will monitor for.  Lastly, the combo can also cause Serotonin Syndrome rarely - there are no signs of serotonin syndrome on exam. - Continue combo psych meds for now and monitor. - Discontinue Meclizine and Oxycodone.  OK to continue Gabapentin at 100 mg daily.  Acute Mania, resolved, due to medication non-adherence: Per son, patient stopped taking her meds x 1 week. - Medication adherence may be the main underlying issue here - we are organizing discharge to short term rehab facility for physical therapy followed by Home  with Home Health.   - Medication adherence and pill packing will be important moving forward.  Frequent falls/generalized weakness: This is due to Polypharmacy plus physical deconditioning. - Discharge to SNF with PT/OT.  Rhabdomyolysis, post-traumatic, resolved.  Hypokalemia / Hypophosphatemia/ Hypomagnesium: -Electrolytes were replaced.  Mild Elevation in LFTs - AST/ALT ratio indicates this is likely related to muscle breakdown: - Monitor.  Bipolar Depression. - Continue Lamictal, Bupropion, and SSRI for now. - We will discuss with Psychiatry.  It may be helpful to taper Bupropion outpatient.  Bipolar disorder. -continue Lamictal.  Hypothyroidism. -Synthroid.  Asthma without exacerbation. -Inhalers PRN  Mobility: PT eval pending Code Status:   Code Status: Full Code  Nutritional status: Body mass index is 29.11 kg/m.     Diet:  Diet Order             Diet Heart Room service appropriate? Yes; Fluid consistency: Thin  Diet effective now                  DVT prophylaxis:  enoxaparin (LOVENOX) injection 40 mg Start: 08/06/20 2245   Antimicrobials: None Fluid: Currently normal saline 125 mill per hour Consultants: Psychiatry consult ordered Family Communication: None at bedside  Status is: Inpatient  Remains inpatient appropriate because: Needs IV hydration, placement  Dispo: The patient is from: Home              Anticipated d/c is to: Discharge is pending SNF placement.              Patient currently is not medically  stable to d/c.   Difficult to place patient No     Infusions:   potassium PHOSPHATE IVPB (in mmol) 10 mmol (08/08/20 1128)    Scheduled Meds:  buPROPion  200 mg Oral BID   cholecalciferol  1,000 Units Oral Daily   diltiazem  120 mg Oral Daily   enoxaparin (LOVENOX) injection  40 mg Subcutaneous Q24H   FLUoxetine  40 mg Oral Daily   gabapentin  100 mg Oral QHS   lamoTRIgine  200 mg Oral BID   latanoprost  1 drop Both Eyes QHS    levothyroxine  50 mcg Oral Q0600   loratadine  10 mg Oral Daily   losartan  50 mg Oral Daily   mometasone-formoterol  2 puff Inhalation BID   montelukast  10 mg Oral Daily   multivitamin-lutein  1 capsule Oral BID   umeclidinium bromide  1 puff Inhalation Daily    Antimicrobials: Anti-infectives (From admission, onward)    None       PRN meds: acetaminophen **OR** acetaminophen, albuterol, fluticasone, ipratropium-albuterol, magnesium hydroxide, meclizine, melatonin, ondansetron **OR** ondansetron (ZOFRAN) IV, oxyCODONE, polyethylene glycol, zolpidem   Objective: Vitals:   08/08/20 0745 08/08/20 1205  BP: (!) 132/47 130/63  Pulse: 80 92  Resp: 19 18  Temp: 98.3 F (36.8 C) 98.7 F (37.1 C)  SpO2: 97% 99%    Intake/Output Summary (Last 24 hours) at 08/08/2020 1514 Last data filed at 08/08/2020 1129 Gross per 24 hour  Intake --  Output 1300 ml  Net -1300 ml    Filed Weights   08/06/20 1817 08/07/20 0324  Weight: 63.5 kg 67.6 kg   Weight change:  Body mass index is 29.11 kg/m.   Physical Exam: General exam: Pleasant, elderly Caucasian female.  Not in physical distress Skin: No rashes, lesions or ulcers. HEENT: Atraumatic, normocephalic, no obvious bleeding Lungs: Clear to auscultation bilaterally CVS: Regular rate and rhythm, no murmur GI/Abd soft, nontender, nondistended, bowel sound present CNS: Alert, awake, oriented to place and person Psychiatry: Mood appropriate Extremities: No pedal edema, no calf tenderness.  Multiple tender spots related to fibromyalgia  Data Review: I have personally reviewed the laboratory data and studies available.  Recent Labs  Lab 08/03/20 1614 08/04/20 0412 08/06/20 1814 08/07/20 0413 08/08/20 0634  WBC 14.7* 10.4 13.6* 10.3 8.7  NEUTROABS 12.1*  --   --   --  5.4  HGB 12.0 10.3* 13.4 10.4* 9.3*  HCT 36.6 31.6* 39.6 31.2* 28.2*  MCV 94.8 94.9 91.5 94.0 94.6  PLT 303 272 373 285 249    Recent Labs  Lab  08/04/20 0412 08/05/20 0606 08/06/20 1814 08/07/20 0413 08/08/20 0634  NA 138 133* 138 139 137  K 3.8 4.3 3.4* 3.0* 3.6  CL 106 101 95* 98 105  CO2 23 26 30 28 28   GLUCOSE 99 102* 85 75 101*  BUN 18 16 14 13 10   CREATININE 0.67 0.64 0.63 0.59 0.60  CALCIUM 8.2* 8.1* 9.0 8.1* 7.8*  MG  --   --   --   --  1.8  PHOS  --   --   --   --  2.4*     F/u labs ordered Unresulted Labs (From admission, onward)     Start     Ordered   08/08/20 1224  Basic metabolic panel  Daily,   R      08/07/20 1047   08/08/20 0500  CBC with Differential/Platelet  Daily,   R  08/07/20 1047            Signed, George Hugh, MD [facility] 08/08/2020

## 2020-08-08 NOTE — Progress Notes (Signed)
Occupational Therapy Treatment Patient Details Name: Heidi Fuentes MRN: [id number] DOB: [birth year] Today's Date: 08/08/2020    History of present illness Pt admitted for anemia with complaints of multiple falls. History includes bipolar, anemia, and recent hospital stay secondary to rhabdo.   OT comments  Pt seen for OT tx this date. RN in room preparing to change linens that we wet due to purewick malfunction. Pt agreeable to OT. Pt instructed in log rolling and use of bed rails to support bed level ADL tasks, requiring MIN-MOD A to perform and MIN A to maintain sidelying for RN to provide hygiene/pericare. Bed required for repositioning afterwards to improve upright positioning with pt endorsing her breathing was improved with repositioning. Set up for grooming tasks long sitting in bed. Min A for UB dressing and for UB bathing and application of powder to underneath her chest per pt's request. RN notified. Pt continues to benefit from skilled OT services. Progressing slowly. Pain 7/10 back and BLE per pt report. Continue to recommend SNF.    Follow Up Recommendations  SNF;Supervision/Assistance - 24 hour    Equipment Recommendations  3 in 1 bedside commode    Recommendations for Other Services      Precautions / Restrictions Precautions Precautions: Fall Restrictions Weight Bearing Restrictions: No       Mobility Bed Mobility Overal bed mobility: Needs Assistance Bed Mobility: Rolling Rolling: Mod assist;Min assist         General bed mobility comments: Pt required MIN-MOD A for log rolling side to side during linens change    Transfers                 General transfer comment: deferred 2/2 pain    Balance                                           ADL either performed or assessed with clinical judgement   ADL Overall ADL's : Needs assistance/impaired     Grooming: Bed level;Set up;Wash/dry face;Oral care   Upper Body Bathing: Bed  level;Set up;Minimal assistance Upper Body Bathing Details (indicate cue type and reason): Required Min A for washing and applying powder to underneath her chest                                 Vision       Perception     Praxis      Cognition Arousal/Alertness: Awake/alert Behavior During Therapy: WFL for tasks assessed/performed Overall Cognitive Status: No family/caregiver present to determine baseline cognitive functioning                                 General Comments: pleasant, follows commands with cues        Exercises     Shoulder Instructions       General Comments      Pertinent Vitals/ Pain       Pain Assessment: 0-10 Pain Score: 7  Pain Location: back and knees Pain Descriptors / Indicators: Aching Pain Intervention(s): Limited activity within patient's tolerance;Monitored during session;Repositioned;Patient requesting pain meds-RN notified  Home Living  Prior Functioning/Environment              Frequency  Min 2X/week        Progress Toward Goals  OT Goals(current goals can now be found in the care plan section)  Progress towards OT goals: Progressing toward goals  Acute Rehab OT Goals Patient Stated Goal: Get stronger and go home OT Goal Formulation: With patient Time For Goal Achievement: 08/21/20 Potential to Achieve Goals: [place] Discharge plan remains appropriate;Frequency remains appropriate    Co-evaluation                 AM-PAC OT "6 Clicks" Daily Activity     Outcome Measure   Help from another person eating meals?: A Little Help from another person taking care of personal grooming?: A Little Help from another person toileting, which includes using toliet, bedpan, or urinal?: A Lot Help from another person bathing (including washing, rinsing, drying)?: A Lot Help from another person to put on and taking off regular upper  body clothing?: A Little Help from another person to put on and taking off regular lower body clothing?: A Lot 6 Click Score: 15    End of Session    OT Visit Diagnosis: Other abnormalities of gait and mobility (R26.89);Muscle weakness (generalized) (M62.81);Pain Pain - Right/Left: Right (both and back) Pain - part of body: Leg   Activity Tolerance Patient limited by pain   Patient Left in bed;with call bell/phone within reach;with bed alarm set   Nurse Communication Patient requests pain meds        Time: 1130-1146 OT Time Calculation (min): 16 min  Charges: OT General Charges $OT Visit: 1 Visit OT Treatments $Self Care/Home Management : 8-22 mins  Hanley Hays, MPH, MS, OTR/L ascom [phone] 08/08/20, 11:55 AM

## 2020-08-08 NOTE — Progress Notes (Signed)
Physical Therapy Treatment Patient Details Name: Heidi Fuentes MRN: [id number] DOB: 09-02-1945 Today's Date: 08/08/2020    History of Present Illness Pt admitted for anemia with complaints of multiple falls. History includes bipolar, anemia, and recent hospital stay secondary to rhabdo.    PT Comments    Pt resting in bed upon PT arrival; agreeable to PT session.  Pt c/o headache and back pain (nurse reports recent pain meds).  Mod assist with bed mobility; mod assist to stand up to RW; and mod assist to take some steps in place with B UE support on RW (pt unsteady with loss of balance requiring assist to maintain upright).  Pt appearing confused at times during session.  Pt assisted back to bed end of session with call bell and phone in reach.  Will continue to focus on strengthening, balance, and progressive functional mobility during hospitalization.   Follow Up Recommendations  SNF     Equipment Recommendations  Other (comment) (TBD at next venue of care)    Recommendations for Other Services       Precautions / Restrictions Precautions Precautions: Fall Restrictions Weight Bearing Restrictions: No    Mobility  Bed Mobility Overal bed mobility: Needs Assistance Bed Mobility: Supine to Sit;Sit to Supine   Supine to sit: Mod assist Sit to supine: Mod assist   General bed mobility comments: assist for trunk and B LE's; vc's for technique    Transfers Overall transfer level: Needs assistance Equipment used: Rolling walker (2 wheeled) Transfers: Sit to/from Stand Sit to Stand: Mod assist         General transfer comment: pt unable to stand 1st trial with 1 assist but able to stand up to RW next 2 trials with mod assist x1; vc's for UE/LE placement and overall technique required  Ambulation/Gait Ambulation/Gait assistance: Mod assist Gait Distance (Feet):  (pt took 2 steps in place B LE's and next trial took 5 steps in place B LE's) Assistive device: Rolling walker  (2 wheeled)   Gait velocity: decreased   General Gait Details: pre-gait activities performed; pt unsteady with multiple loss of balance towards L lateral direction with taking steps in place requiring assist for balance   Stairs             Wheelchair Mobility    Modified Rankin (Stroke Patients Only)       Balance Overall balance assessment: History of Falls;Needs assistance Sitting-balance support: Single extremity supported;Feet supported Sitting balance-Leahy Scale: Fair Sitting balance - Comments: pt requiring at least single UE support for static sitting balance   Standing balance support: Bilateral upper extremity supported Standing balance-Leahy Scale: Poor Standing balance comment: pt with posterior lean with static standing balance requiring vc's and tactile cues for upright balance                            Cognition Arousal/Alertness: Awake/alert Behavior During Therapy: Anxious Overall Cognitive Status: No family/caregiver present to determine baseline cognitive functioning                                 General Comments: Oriented to person and place      Exercises      General Comments  Nursing cleared pt for participation in physical therapy.  Pt agreeable to PT session.      Pertinent Vitals/Pain Pain Assessment: Faces Faces Pain Scale: Hurts little  more Pain Location: back and headache Pain Descriptors / Indicators: Headache;Sore Pain Intervention(s): Limited activity within patient's tolerance;Monitored during session;Premedicated before session;Repositioned Vitals (HR and O2 on room air) stable and WFL throughout treatment session.    Home Living                      Prior Function            PT Goals (current goals can now be found in the care plan section) Acute Rehab PT Goals Patient Stated Goal: Get stronger and go home PT Goal Formulation: With patient Time For Goal Achievement:  08/21/20 Potential to Achieve Goals: Fair Additional Goals Additional Goal #1: Pt will be able to perform bed mobility/transfers with supervision and safe technique in order to improve functional independence Progress towards PT goals: Progressing toward goals    Frequency    Min 2X/week      PT Plan Current plan remains appropriate    Co-evaluation              AM-PAC PT "6 Clicks" Mobility   Outcome Measure  Help needed turning from your back to your side while in a flat bed without using bedrails?: None Help needed moving from lying on your back to sitting on the side of a flat bed without using bedrails?: A Lot Help needed moving to and from a bed to a chair (including a wheelchair)?: A Lot Help needed standing up from a chair using your arms (e.g., wheelchair or bedside chair)?: A Lot Help needed to walk in hospital room?: Total Help needed climbing 3-5 steps with a railing? : Total 6 Click Score: 12    End of Session Equipment Utilized During Treatment: Gait belt Activity Tolerance: Patient limited by pain;Patient limited by fatigue Patient left: in bed;with call bell/phone within reach;with bed alarm set;Other (comment) (B heels floating via pillow support) Nurse Communication: Mobility status;Precautions;Other (comment) (pt's c/o headache and back pain) PT Visit Diagnosis: Unsteadiness on feet (R26.81);Other abnormalities of gait and mobility (R26.89);Muscle weakness (generalized) (M62.81);History of falling (Z91.81)     Time: 9323-5573 PT Time Calculation (min) (ACUTE ONLY): 34 min  Charges:  $Therapeutic Activity: 23-37 mins                    Leitha Bleak, PT 08/08/20, 3:56 PM

## 2020-08-09 LAB — CBC WITH DIFFERENTIAL/PLATELET
Abs Immature Granulocytes: 0.1 10*3/uL — ABNORMAL HIGH (ref 0.00–0.07)
Basophils Absolute: 0 10*3/uL (ref 0.0–0.1)
Basophils Relative: 0 %
Eosinophils Absolute: 0.8 10*3/uL — ABNORMAL HIGH (ref 0.0–0.5)
Eosinophils Relative: 7 %
HCT: 34.2 % — ABNORMAL LOW (ref 36.0–46.0)
Hemoglobin: 11.2 g/dL — ABNORMAL LOW (ref 12.0–15.0)
Immature Granulocytes: 1 %
Lymphocytes Relative: 17 %
Lymphs Abs: 1.8 10*3/uL (ref 0.7–4.0)
MCH: 31.6 pg (ref 26.0–34.0)
MCHC: 32.7 g/dL (ref 30.0–36.0)
MCV: 96.6 fL (ref 80.0–100.0)
Monocytes Absolute: 1.1 10*3/uL — ABNORMAL HIGH (ref 0.1–1.0)
Monocytes Relative: 11 %
Neutro Abs: 6.9 10*3/uL (ref 1.7–7.7)
Neutrophils Relative %: 64 %
Platelets: 258 10*3/uL (ref 150–400)
RBC: 3.54 MIL/uL — ABNORMAL LOW (ref 3.87–5.11)
RDW: 15.1 % (ref 11.5–15.5)
WBC: 10.8 10*3/uL — ABNORMAL HIGH (ref 4.0–10.5)
nRBC: 0 % (ref 0.0–0.2)

## 2020-08-09 LAB — BASIC METABOLIC PANEL
Anion gap: 6 (ref 5–15)
BUN: 8 mg/dL (ref 8–23)
CO2: 26 mmol/L (ref 22–32)
Calcium: 8.7 mg/dL — ABNORMAL LOW (ref 8.9–10.3)
Chloride: 104 mmol/L (ref 98–111)
Creatinine, Ser: 0.53 mg/dL (ref 0.44–1.00)
GFR, Estimated: >60 mL/min (ref >60)
Glucose, Bld: 91 mg/dL (ref 70–99)
Potassium: 4 mmol/L (ref 3.5–5.1)
Sodium: 136 mmol/L (ref 135–145)

## 2020-08-09 MED ORDER — OLANZAPINE 5 MG PO TABS
5.0000 mg | ORAL_TABLET | Freq: Three times a day (TID) | ORAL | Status: DC | PRN
  Administered 2020-08-10 – 2020-08-11 (×2): 5 mg via ORAL
  Filled 2020-08-09 (×2): qty 1

## 2020-08-09 MED ORDER — BUPROPION HCL ER (SR) 100 MG PO TB12
100.0000 mg | ORAL_TABLET | Freq: Two times a day (BID) | ORAL | Status: AC
  Administered 2020-08-09 – 2020-08-12 (×7): 100 mg via ORAL
  Filled 2020-08-09 (×7): qty 1

## 2020-08-09 MED ORDER — LACTATED RINGERS IV SOLN: INTRAVENOUS | Status: DC

## 2020-08-09 NOTE — Care Management Important Message (Signed)
Important Message  Patient Details  Name: Heidi Fuentes MRN: [id number] Date of Birth: [birth year]   Medicare Important Message Given:  Yes     Juliann Pulse A Florabelle Cardin 08/09/2020, 11:03 AM

## 2020-08-09 NOTE — Progress Notes (Addendum)
PROGRESS NOTE  [name]  DOB: Jan 21, 1946  PCP: Alfonse Flavors, MD ACZ:[id number]  DOA: 08/06/2020  LOS: 3 days  Hospital Day: 4   Chief Complaint  Patient presents with   Failure To Thrive    Brief narrative: Heidi Fuentes is a 75 y.o. female with PMH of Bipolar Depression and Chronic Pain Syndrome on multiple psychiatric and pain medications (Lamictal, Bupropion, Prozac, Anti-Histamines, Gabapentin, Tizanidine, and Oxycodone) who was brought to the ED on 7/5 by her son after experiencing an acute manic episode x 1 week after she abruptly stopped taking her medications.  She was recently treated for an acute asthma exacerbation with 2 weeks of Prednisone.    7/2-7/5 Admitted to [facility] for weakness and dehydration found to have mild rhabdomyolysis with CK 4000.  Patient refused SNF and was discharged home. 7/5 Readmitted to [facility].  Subjective: Patient was seen and examined this morning.  Pleasant elderly Caucasian female.  Propped up in bed.  Not in distress.  Alert, awake, oriented to place and person.  Agrees to rehab placement.  Assessment/Plan:  Acute Encephalopathy secondary to Polypharmacy, resolved: Patient was on multiple psychiatric meds in addition to a newly prescribed steroid which may have triggered an acute manic episode.   - The concurrent use of Bupropion and SSRI can increase risk of seizures - however son and patient do not report any witnessed seizure like activity.   - The combo of Bupropion and SSRI can also increase the risk of panic and acute delirium, which patient showed signs of today.  Bupropion has a dopamine enhancing effect - I will decrease Bupropion from 200 to 100 mg BID.   - Give Zyprexa 5 mg PRN for hallucinations. - It is ok to continue Prozac and Lamictal as these are chronic meds at high dosing. - Discontinued Meclizine and Oxycodone.   - OK to continue Gabapentin at 100 mg daily.  Acute Mania, resolved, due to medication  non-adherence: Per son patient stopped taking her medications for 1-2 weeks and possibly took extra medications once unstable. - Medication adherence may be the main underlying issue here - we are organizing discharge to short term rehab facility for physical therapy followed by Home with Home Health.   - Medication adherence and pill packing will be important to emphasize moving forward. - Follow up with regular Psychiatrist outpatient.  Acute Mild Rhabdomyolysis, improved: This may have been due to taking multiple pills of Lamictal / SSRI. - Continue gentle fluids for today. - Continue Physical Therapy as tolerated.  Bipolar Depression: - Continue Lamictal, Bupropion, and SSRI for now. - Taper Bupropion as tolerated as above.  Frequent Falls / Generalized Weakness: This is due to Polypharmacy plus Physical Deconditioning. - Discharge to short term rehab facility for PT/OT.  Hypokalemia / Hypophosphatemia/ Hypomagnesium: -Electrolytes were replaced.  Mild Elevation in LFTs - AST/ALT ratio indicates this was related to muscle breakdown: - Monitor.  Hypothyroidism: - Synthroid  Asthma, not in acute exacerbation: - Inhalers PRN  Mobility: PT eval pending Code Status:   Code Status: Full Code  Nutritional status: Body mass index is 29.11 kg/m.     Diet:  Diet Order             Diet Heart Room service appropriate? Yes; Fluid consistency: Thin  Diet effective now                  DVT prophylaxis:  enoxaparin (LOVENOX) injection 40 mg Start: 08/06/20 2245  Antimicrobials: None Fluid: Currently normal saline 125 mill per hour Consultants: Psychiatry consult ordered Family Communication: None at bedside  Status is: Inpatient  Remains inpatient appropriate because: Needs IV hydration, placement  Dispo: The patient is from: Home              Anticipated d/c is to: Discharge is pending SNF placement.              Patient currently is not medically stable to d/c.    Difficult to place patient No  Infusions:   lactated ringers 100 mL/hr at 08/09/20 1314    Scheduled Meds:  buPROPion  100 mg Oral BID   cholecalciferol  1,000 Units Oral Daily   diltiazem  120 mg Oral Daily   enoxaparin (LOVENOX) injection  40 mg Subcutaneous Q24H   FLUoxetine  40 mg Oral Daily   gabapentin  100 mg Oral QHS   lamoTRIgine  200 mg Oral BID   latanoprost  1 drop Both Eyes QHS   levothyroxine  50 mcg Oral Q0600   loratadine  10 mg Oral Daily   losartan  50 mg Oral Daily   mometasone-formoterol  2 puff Inhalation BID   montelukast  10 mg Oral Daily   multivitamin-lutein  1 capsule Oral BID   umeclidinium bromide  1 puff Inhalation Daily    Antimicrobials: Anti-infectives (From admission, onward)    None       PRN meds: acetaminophen **OR** acetaminophen, albuterol, fluticasone, ipratropium-albuterol, magnesium hydroxide, melatonin, OLANZapine, ondansetron **OR** ondansetron (ZOFRAN) IV, polyethylene glycol, zolpidem   Objective: Vitals:   08/09/20 0513 08/09/20 0751  BP: (!) 150/75 (!) 164/76  Pulse: 85 87  Resp: 18 16  Temp: 98.2 F (36.8 C) 98.4 F (36.9 C)  SpO2: 99% 97%    Intake/Output Summary (Last 24 hours) at 08/09/2020 1636 Last data filed at 08/09/2020 1417 Gross per 24 hour  Intake 734.6 ml  Output 2900 ml  Net -2165.4 ml    Filed Weights   08/06/20 1817 08/07/20 0324  Weight: 63.5 kg 67.6 kg   Weight change:  Body mass index is 29.11 kg/m.   Physical Exam: General exam: Pleasant this am, NAD Skin: No rashes, lesions or ulcers. HEENT: NCAT, PERRL, no icterus Lungs: Clear to auscultation bilaterally CVS: Regular rate and rhythm, no murmur GI/Abd soft, nontender, nondistended, bowel sound present CNS: No acute deficits, generalized weakness. Psychiatry: Mood fluctuates Extremities: No pedal edema, no calf tenderness.  Multiple tender spots related to fibromyalgia  Data Review: I have personally reviewed the laboratory data  and studies available.  Recent Labs  Lab 08/03/20 1614 08/04/20 0412 08/06/20 1814 08/07/20 0413 08/08/20 0634 08/09/20 0549  WBC 14.7* 10.4 13.6* 10.3 8.7 10.8*  NEUTROABS 12.1*  --   --   --  5.4 6.9  HGB 12.0 10.3* 13.4 10.4* 9.3* 11.2*  HCT 36.6 31.6* 39.6 31.2* 28.2* 34.2*  MCV 94.8 94.9 91.5 94.0 94.6 96.6  PLT 303 272 373 285 249 258    Recent Labs  Lab 08/05/20 0606 08/06/20 1814 08/07/20 0413 08/08/20 0634 08/09/20 0549  NA 133* 138 139 137 136  K 4.3 3.4* 3.0* 3.6 4.0  CL 101 95* 98 105 104  CO2 26 30 28 28 26   GLUCOSE 102* 85 75 101* 91  BUN 16 14 13 10 8   CREATININE 0.64 0.63 0.59 0.60 0.53  CALCIUM 8.1* 9.0 8.1* 7.8* 8.7*  MG  --   --   --  1.8  --  PHOS  --   --   --  2.4*  --      F/u labs ordered Unresulted Labs (From admission, onward)     Start     Ordered   08/08/20 3736  Basic metabolic panel  Daily,   R      08/07/20 1047   08/08/20 0500  CBC with Differential/Platelet  Daily,   R      08/07/20 1047            Signed, George Hugh, MD [facility] 08/09/2020

## 2020-08-10 LAB — CBC WITH DIFFERENTIAL/PLATELET
Abs Immature Granulocytes: 0.13 10*3/uL — ABNORMAL HIGH (ref 0.00–0.07)
Basophils Absolute: 0 10*3/uL (ref 0.0–0.1)
Basophils Relative: 0 %
Eosinophils Absolute: 0.5 10*3/uL (ref 0.0–0.5)
Eosinophils Relative: 4 %
HCT: 34.1 % — ABNORMAL LOW (ref 36.0–46.0)
Hemoglobin: 11.4 g/dL — ABNORMAL LOW (ref 12.0–15.0)
Immature Granulocytes: 1 %
Lymphocytes Relative: 19 %
Lymphs Abs: 1.9 10*3/uL (ref 0.7–4.0)
MCH: 30.9 pg (ref 26.0–34.0)
MCHC: 33.4 g/dL (ref 30.0–36.0)
MCV: 92.4 fL (ref 80.0–100.0)
Monocytes Absolute: 1.2 10*3/uL — ABNORMAL HIGH (ref 0.1–1.0)
Monocytes Relative: 12 %
Neutro Abs: 6.4 10*3/uL (ref 1.7–7.7)
Neutrophils Relative %: 64 %
Platelets: 306 10*3/uL (ref 150–400)
RBC: 3.69 MIL/uL — ABNORMAL LOW (ref 3.87–5.11)
RDW: 15.1 % (ref 11.5–15.5)
WBC: 10.1 10*3/uL (ref 4.0–10.5)
nRBC: 0 % (ref 0.0–0.2)

## 2020-08-10 LAB — BASIC METABOLIC PANEL
Anion gap: 8 (ref 5–15)
BUN: 6 mg/dL — ABNORMAL LOW (ref 8–23)
CO2: 27 mmol/L (ref 22–32)
Calcium: 8.8 mg/dL — ABNORMAL LOW (ref 8.9–10.3)
Chloride: 102 mmol/L (ref 98–111)
Creatinine, Ser: 0.54 mg/dL (ref 0.44–1.00)
GFR, Estimated: >60 mL/min (ref >60)
Glucose, Bld: 111 mg/dL — ABNORMAL HIGH (ref 70–99)
Potassium: 3.2 mmol/L — ABNORMAL LOW (ref 3.5–5.1)
Sodium: 137 mmol/L (ref 135–145)

## 2020-08-10 LAB — CK: Total CK: 243 U/L — ABNORMAL HIGH (ref 38–234)

## 2020-08-10 MED ORDER — POTASSIUM CHLORIDE CRYS ER 20 MEQ PO TBCR
40.0000 meq | EXTENDED_RELEASE_TABLET | Freq: Once | ORAL | Status: AC
  Administered 2020-08-10: 40 meq via ORAL
  Filled 2020-08-10: qty 2

## 2020-08-10 MED ORDER — SENNOSIDES-DOCUSATE SODIUM 8.6-50 MG PO TABS
2.0000 | ORAL_TABLET | Freq: Every day | ORAL | Status: DC
  Administered 2020-08-10 – 2020-08-13 (×4): 2 via ORAL
  Filled 2020-08-10 (×4): qty 2

## 2020-08-10 MED ORDER — OXYCODONE-ACETAMINOPHEN 5-325 MG PO TABS
1.0000 | ORAL_TABLET | Freq: Two times a day (BID) | ORAL | Status: DC | PRN
  Administered 2020-08-10 – 2020-08-11 (×2): 1 via ORAL
  Filled 2020-08-10 (×3): qty 1

## 2020-08-10 NOTE — Progress Notes (Signed)
Physical Therapy Treatment Patient Details Name: Heidi Fuentes MRN: [id number] DOB: 06-20-1945 Today's Date: 08/10/2020    History of Present Illness Pt admitted for anemia with complaints of multiple falls. History includes bipolar, anemia, and recent hospital stay secondary to rhabdo.    PT Comments    Patient motivated and participated well within session. Instructed patient in advanced LE ROM/strengthening exercise. Initially she had a difficult time moving RLE, however following exercise she was able to exhibit better muscle activation and LE movement initiation. Patient required min A for bed mobility and sit<>Stand transfer. She does require min-mod VCs for sequencing and positioning. She reports increased fatigue at end of session but denies any increase in pain. She would benefit from additional skilled PT Intervention to improve strength, balance and mobility;    Follow Up Recommendations  SNF     Equipment Recommendations  Other (comment) (TBD at next venue of care)    Recommendations for Other Services       Precautions / Restrictions Precautions Precautions: Fall Restrictions Weight Bearing Restrictions: No    Mobility  Bed Mobility Overal bed mobility: Needs Assistance Bed Mobility: Supine to Sit Rolling: Supervision   Supine to sit: Min assist     General bed mobility comments: Required mod VCS for hand placement and LE positioning, Min A for assisting trunk into sitting position;    Transfers Overall transfer level: Needs assistance Equipment used: Rolling walker (2 wheeled) Transfers: Sit to/from Stand Sit to Stand: Min assist         General transfer comment: Patient transferred sit<>Stand from bed x1 rep with min A and transferred sit<>Stand from bedside chair x1 rep with min A using RW with min VCs for hand placement and positioning;  Ambulation/Gait Ambulation/Gait assistance: Min assist Gait Distance (Feet): 3 Feet Assistive device: Rolling  walker (2 wheeled)   Gait velocity: decreased   General Gait Details: Pt able to take 3-4 steps to bedside chair with RW, min  A for safety, requiring increased time and min-mod VCs for sequencing and positioning; exhibits short shuffled step, decreased ankle DF, wide base of support;   Stairs             Wheelchair Mobility    Modified Rankin (Stroke Patients Only)       Balance Overall balance assessment: History of Falls;Needs assistance Sitting-balance support: Bilateral upper extremity supported;Feet supported Sitting balance-Leahy Scale: Fair     Standing balance support: Bilateral upper extremity supported Standing balance-Leahy Scale: Poor Standing balance comment: Requires min A for balance in standing, using RW with increased lateral loss of balance;                            Cognition Arousal/Alertness: Awake/alert Behavior During Therapy: Anxious Overall Cognitive Status: No family/caregiver present to determine baseline cognitive functioning                                 General Comments: Oriented to person and place      Exercises Other Exercises Other Exercises: Instructed patient in LE strengthening exercise: supine: ankle pumpx 15 reps, SAQ x15 reps both bilaterally; Patient instructed in LLE heel slides x15 reps (unable to do RLE due to knee stiffness and weakness); Instructed patient in RLE SLR hip flexion x5 reps AAROM; Patient required min VCs for proper exercise technique and positioning. Following exercise, she was able  to exhibit better muscle activation and LE movement initiation in BLE.    General Comments General comments (skin integrity, edema, etc.): does have scabbed area over right knee      Pertinent Vitals/Pain Pain Assessment: 0-10 Pain Score: 5  Pain Location: right knee/ankles Pain Descriptors / Indicators: Aching;Sore Pain Intervention(s): Limited activity within patient's tolerance;Monitored  during session;Repositioned    Home Living                      Prior Function            PT Goals (current goals can now be found in the care plan section) Acute Rehab PT Goals Patient Stated Goal: Get stronger and go home PT Goal Formulation: With patient Time For Goal Achievement: 08/21/20 Potential to Achieve Goals: Fair Additional Goals Additional Goal #1: Pt will be able to perform bed mobility/transfers with supervision and safe technique in order to improve functional independence    Frequency    Min 2X/week      PT Plan Current plan remains appropriate    Co-evaluation              AM-PAC PT "6 Clicks" Mobility   Outcome Measure  Help needed turning from your back to your side while in a flat bed without using bedrails?: None Help needed moving from lying on your back to sitting on the side of a flat bed without using bedrails?: A Little Help needed moving to and from a bed to a chair (including a wheelchair)?: A Little Help needed standing up from a chair using your arms (e.g., wheelchair or bedside chair)?: A Little Help needed to walk in hospital room?: A Lot Help needed climbing 3-5 steps with a railing? : Total 6 Click Score: 16    End of Session Equipment Utilized During Treatment: Gait belt Activity Tolerance: Patient limited by pain;Patient limited by fatigue Patient left: with call bell/phone within reach;in chair;with chair alarm set (B heels floating via pillow support) Nurse Communication:  (pt's c/o headache and back pain) PT Visit Diagnosis: Unsteadiness on feet (R26.81);Other abnormalities of gait and mobility (R26.89);Muscle weakness (generalized) (M62.81);History of falling (Z91.81)     Time: 0998-3382 PT Time Calculation (min) (ACUTE ONLY): 30 min  Charges:  $Therapeutic Exercise: 8-22 mins $Therapeutic Activity: 8-22 mins                        Kaniah Rizzolo PT, DPT 08/10/2020, 10:27 AM

## 2020-08-10 NOTE — TOC Progression Note (Addendum)
Transition of Care [facility]) - Progression Note    Patient Details  Name: Heidi Fuentes MRN: [id number] Date of Birth: [birth year]  [facility] of [facility] CM/SW Contact  Izola Price, RN Phone Number: 08/10/2020, 9:06 AM  Clinical Narrative:  7/6 son authorized bed search and CM stated bed search started, FL2 completed 7/6, and on 7/8 provider states son is agreeable to rehab. CM notes indicated son prefers Inpatient rehab at [facility] Will follow up today with decisions.  Simmie Davies RN [facility] In patient [place], [place]: [phone]. FAX: [phone]. SNF/Rehab still disposition. Documented bed search started but [facility] at [facility] preferred by son.        Expected Discharge Plan and Services                                                 Social Determinants of Health (SDOH) Interventions    Readmission Risk Interventions No flowsheet data found.

## 2020-08-10 NOTE — Progress Notes (Addendum)
PROGRESS NOTE  Heidi Fuentes  DOB: Apr 20, 1945  PCP: Alfonse Flavors, MD TDV:[id number]  DOA: 08/06/2020  LOS: 4 days  Hospital Day: 5   Chief Complaint  Patient presents with   Failure To Thrive    Brief narrative: Heidi Fuentes is a 75 y.o. female with PMH of Bipolar Depression and Chronic Pain Syndrome on multiple psychiatric and pain medications (Lamictal, Bupropion, Prozac, Anti-Histamines, Gabapentin, Tizanidine, and Oxycodone) who was brought to the ED on 7/5 by her son after experiencing an acute manic episode x 1 week after she abruptly stopped taking her medications.  She was recently treated for an acute asthma exacerbation with 2 weeks of Prednisone.    7/2-7/5 Admitted to [facility] for weakness and dehydration found to have mild rhabdomyolysis with CK 4000.  Patient refused SNF and was discharged home. 7/5 Readmitted to [facility].  Subjective: Patient was seen and examined this morning.  Pleasant elderly Caucasian female.  Propped up in bed.  Not in distress.  Alert, awake, oriented to place and person.  Agrees to rehab placement.  Assessment/Plan:  Acute Encephalopathy secondary to Polypharmacy, resolved: Patient was on multiple psychiatric meds in addition to a newly prescribed steroid which may have triggered an acute manic episode.   - The concurrent use of Bupropion and SSRI can increase risk of seizures to explain elevated CK - however son and patient do not report any witnessed seizure like activity.   - The combo of Bupropion and SSRI can also increase the risk of panic and acute delirium, which we have seen during hospitalization.  Given Bupropion dopamine enhancing effect - Bupropion was decreased from 200 to 100 mg BID ton 7/8.  This needs to be gradually tapered off every 2 weeks. - Give Zyprexa 5 mg PRN for agitation or hallucinations while inpatient. - Continue Fluoxetine 40 mg daily and Lamictal 200 mg daily as these are chronic meds at therapeutic  dosages. - Continue Oxycodone at low dose as patient has chronic opiate dependence. - Discontinued Meclizine and Gabapentin.   Constipation / Chronic Use of Opiates:  - Senna and Miralax. - Monitor bowel movements.  Acute Mania, resolved, due to medication non-adherence: Per son patient stopped taking her medications for 1-2 weeks and possibly took extra medications once unstable. - Medication adherence may be the main underlying issue here - we are organizing discharge to short term rehab facility for physical therapy followed by Home with Home Health.   - Medication adherence and pill packing will be important to emphasize moving forward. - Follow up with regular Psychiatrist outpatient.  Acute Mild Rhabdomyolysis, resolved: This may have been due to taking multiple pills of Lamictal / SSRI. - Encourage oral hydration. - Continue Physical Therapy as tolerated.  Bipolar Depression: - Continue Lamictal, Bupropion, and SSRI for now. - Taper Bupropion as tolerated as above.  Frequent Falls / Generalized Weakness: This is due to Polypharmacy plus Physical Deconditioning. - Discharge to short term rehab facility for PT/OT.  Hypokalemia / Hypophosphatemia/ Hypomagnesium: -Electrolytes were replaced.  Mild Elevation in LFTs - AST/ALT ratio indicates this was related to muscle breakdown: - Monitor.  Hypothyroidism: - Synthroid  Asthma, not in acute exacerbation: - Inhalers PRN  Mobility: PT eval pending Code Status:   Code Status: Full Code  Nutritional status: Body mass index is 29.11 kg/m.     Diet:  Diet Order             Diet Heart Room service appropriate? Yes; Fluid  consistency: Thin  Diet effective now                  DVT prophylaxis:  enoxaparin (LOVENOX) injection 40 mg Start: 08/06/20 2245   Antimicrobials: None Fluid: Currently normal saline 125 mill per hour Consultants: Psychiatry consult ordered Family Communication: None at bedside  Status is:  Inpatient  Remains inpatient appropriate because: Needs IV hydration, placement  Dispo: The patient is from: Home              Anticipated d/c is to: Discharge is pending SNF placement.              Patient currently is not medically stable to d/c.   Difficult to place patient No  Infusions:     Scheduled Meds:  buPROPion  100 mg Oral BID   cholecalciferol  1,000 Units Oral Daily   diltiazem  120 mg Oral Daily   enoxaparin (LOVENOX) injection  40 mg Subcutaneous Q24H   FLUoxetine  40 mg Oral Daily   lamoTRIgine  200 mg Oral BID   latanoprost  1 drop Both Eyes QHS   levothyroxine  50 mcg Oral Q0600   loratadine  10 mg Oral Daily   losartan  50 mg Oral Daily   mometasone-formoterol  2 puff Inhalation BID   montelukast  10 mg Oral Daily   multivitamin-lutein  1 capsule Oral BID   umeclidinium bromide  1 puff Inhalation Daily    Antimicrobials: Anti-infectives (From admission, onward)    None       PRN meds: acetaminophen **OR** acetaminophen, albuterol, fluticasone, ipratropium-albuterol, magnesium hydroxide, melatonin, OLANZapine, ondansetron **OR** ondansetron (ZOFRAN) IV, oxyCODONE-acetaminophen, polyethylene glycol, zolpidem   Objective: Vitals:   08/10/20 1205 08/10/20 1512  BP: (!) 135/56 138/71  Pulse: 80 83  Resp: 16 19  Temp: 98.2 F (36.8 C) 98.4 F (36.9 C)  SpO2: 98% 96%    Intake/Output Summary (Last 24 hours) at 08/10/2020 1548 Last data filed at 08/10/2020 1337 Gross per 24 hour  Intake 950.78 ml  Output 3350 ml  Net -2399.22 ml    Filed Weights   08/06/20 1817 08/07/20 0324  Weight: 63.5 kg 67.6 kg   Weight change:  Body mass index is 29.11 kg/m.   Physical Exam: General exam: Pleasant this am, NAD Skin: No rashes, lesions or ulcers. HEENT: NCAT, PERRL, no icterus Lungs: Clear to auscultation bilaterally CVS: Regular rate and rhythm, no murmur GI/Abd soft, nontender, nondistended, bowel sound present CNS: No acute deficits,  generalized weakness. Psychiatry: Mood fluctuates Extremities: No pedal edema, no calf tenderness.  Multiple tender spots related to fibromyalgia  Data Review: I have personally reviewed the laboratory data and studies available.  Recent Labs  Lab 08/03/20 1614 08/04/20 0412 08/06/20 1814 08/07/20 0413 08/08/20 0634 08/09/20 0549 08/10/20 0819  WBC 14.7*   < > 13.6* 10.3 8.7 10.8* 10.1  NEUTROABS 12.1*  --   --   --  5.4 6.9 6.4  HGB 12.0   < > 13.4 10.4* 9.3* 11.2* 11.4*  HCT 36.6   < > 39.6 31.2* 28.2* 34.2* 34.1*  MCV 94.8   < > 91.5 94.0 94.6 96.6 92.4  PLT 303   < > 373 285 249 258 306   < > = values in this interval not displayed.    Recent Labs  Lab 08/06/20 1814 08/07/20 0413 08/08/20 0634 08/09/20 0549 08/10/20 0819  NA 138 139 137 136 137  K 3.4* 3.0* 3.6 4.0 3.2*  CL 95* 98 105 104 102  CO2 30 28 28 26 27   GLUCOSE 85 75 101* 91 111*  BUN 14 13 10 8  6*  CREATININE 0.63 0.59 0.60 0.53 0.54  CALCIUM 9.0 8.1* 7.8* 8.7* 8.8*  MG  --   --  1.8  --   --   PHOS  --   --  2.4*  --   --      F/u labs ordered Unresulted Labs (From admission, onward)     Start     Ordered   08/10/20 0500  Lamotrigine level  Tomorrow morning,   R       Question:  Specimen collection method  Answer:  Lab=Lab collect   08/09/20 2354            Signed, George Hugh, MD [facility] 08/10/2020

## 2020-08-10 NOTE — TOC Progression Note (Signed)
Transition of Care [facility]) - Progression Note    Patient Details  Name: Heidi Fuentes MRN: [id number] Date of Birth: 29-Jun-1945  Transition of Care [facility]) CM/SW Contact  Izola Price, RN Phone Number: 08/10/2020, 2:45 PM  Clinical Narrative: Bed search initiated via [place]. Spoke with son and he stated that CM on Friday indicated inpatient at [facility] in [place] had declined saying patient did not need that level of therapy?  Permission to send out search in [place] to [place] area. Faxed CSW referral to [facility] in [place] as well. Prefers as close to [place] as possible so he can visit and assist in care. Simmie Davies RN CM          Expected Discharge Plan and Services                                                 Social Determinants of Health (SDOH) Interventions    Readmission Risk Interventions No flowsheet data found.

## 2020-08-11 LAB — BASIC METABOLIC PANEL
Anion gap: 4 — ABNORMAL LOW (ref 5–15)
BUN: 18 mg/dL (ref 8–23)
CO2: 28 mmol/L (ref 22–32)
Calcium: 8.5 mg/dL — ABNORMAL LOW (ref 8.9–10.3)
Chloride: 107 mmol/L (ref 98–111)
Creatinine, Ser: 0.83 mg/dL (ref 0.44–1.00)
GFR, Estimated: >60 mL/min (ref >60)
Glucose, Bld: 92 mg/dL (ref 70–99)
Potassium: 3.9 mmol/L (ref 3.5–5.1)
Sodium: 139 mmol/L (ref 135–145)

## 2020-08-11 MED ORDER — OXYCODONE HCL 5 MG PO TABS
10.0000 mg | ORAL_TABLET | Freq: Four times a day (QID) | ORAL | Status: DC
  Administered 2020-08-11 – 2020-08-13 (×8): 10 mg via ORAL
  Filled 2020-08-11 (×8): qty 2

## 2020-08-11 MED ORDER — MORPHINE SULFATE (PF) 2 MG/ML IV SOLN
2.0000 mg | Freq: Once | INTRAVENOUS | Status: AC
  Administered 2020-08-11: 2 mg via INTRAVENOUS
  Filled 2020-08-11: qty 1

## 2020-08-11 MED ORDER — LUBIPROSTONE 24 MCG PO CAPS
24.0000 ug | ORAL_CAPSULE | Freq: Two times a day (BID) | ORAL | Status: AC
  Administered 2020-08-11 – 2020-08-12 (×3): 24 ug via ORAL
  Filled 2020-08-11 (×3): qty 1

## 2020-08-11 NOTE — Progress Notes (Addendum)
PROGRESS NOTE  Heidi Fuentes  DOB: 10-06-1945  PCP: Alfonse Flavors, MD ZDG:[id number]  DOA: 08/06/2020  LOS: 5 days  Hospital Day: 6   Chief Complaint  Patient presents with   Failure To Thrive    Brief narrative: Heidi Fuentes is a 75 y.o. female with PMH of Bipolar Depression and Chronic Pain Syndrome on multiple psychiatric and pain medications (Lamictal, Bupropion, Prozac, Anti-Histamines, Gabapentin, Tizanidine, and Oxycodone) who was brought to the ED on 7/5 by her son after experiencing an acute manic episode x 1 week after she abruptly stopped taking her medications.  She was recently treated for an acute asthma exacerbation with 2 weeks of Prednisone.    7/2-7/5 Admitted to [facility] for weakness and dehydration found to have mild rhabdomyolysis with CK 4000.  Patient refused SNF and was discharged home. 7/5 Readmitted to [facility].  Subjective: Heidi Fuentes is doing ok this morning. She endorses significant back pain and requests higher dose of her Oxycodone. Her mood is stable. No more hallucinations. Denies any depression, SI/HI.   Appetite and sleep are ok.  Assessment/Plan:  Acute Encephalopathy secondary to Polypharmacy, resolved: Patient was on multiple psychiatric meds (occasionally nonadherent) and was also newly prescribed a course of steroids which may have triggered her acute manic episode.   - The concurrent use of Bupropion and SSRI can increase risk of seizures to explain elevated CK - however son and patient do not report any witnessed seizure like activity.   - The combo of Bupropion and SSRI can also increase the risk of panic and acute delirium, which we have seen during hospitalization.  Given acute hallucinations and Bupropion's dopamine enhancing effect - I decreased Bupropion from 200 to 100 mg BID on 7/8.  This needs to be gradually tapered every 2 weeks. - Give Zyprexa 5 mg PRN for agitation or hallucinations while inpatient. - Continue Fluoxetine  40 mg daily and Lamictal 200 mg daily as these are chronic meds at therapeutic dosages. - Continue Oxycodone as patient has significant back pain and chronic opiate dependence. - I discontinued Meclizine and Gabapentin.   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  I have tried to taper this medication due to its interaction with Lamictal.  However, patient is in severe pain today.  We will attempt to taper opiates at a later date.  Opiate Related Constipation:  - On Senna and Miralax.  No BM reported. - Start Amitiza 24 mcg BID until she has regular bowel movements.  Acute Mania, resolved, due to medication non-adherence: Per son patient stopped taking her medications for 1-2 weeks and possibly took extra medications once unstable. - Medication adherence may be the main underlying issue here - we are organizing discharge to short term rehab facility for physical therapy followed by Home with Home Health.   - Medication adherence and pill packing will be important to emphasize moving forward. - Follow up with regular Psychiatrist outpatient.  Acute Mild Rhabdomyolysis, resolved: This may have been due to taking multiple pills of Lamictal / SSRI. - Encourage oral hydration. - Continue Physical Therapy as tolerated.  Mild Elevation in LFTs - AST/ALT ratio indicates this was related to muscle breakdown: - Monitor.  Bipolar Depression: - Continue Lamictal, Bupropion, and SSRI for now. - Taper Bupropion as tolerated as above.  Frequent Falls / Generalized Weakness: This is due to Polypharmacy plus Physical Deconditioning. - Discharge to short term rehab facility for PT/OT.  Hypertension: -  Diltiazem and Losartan.  Hypothyroidism: - Synthroid  Asthma, not in acute exacerbation: - Inhalers PRN  Mobility: PT eval pending Code Status:   Code Status: Full Code  Nutritional status: Body mass index is 29.11 kg/m.     Diet:  Diet Order              Diet Heart Room service appropriate? Yes; Fluid consistency: Thin  Diet effective now                  DVT prophylaxis:  enoxaparin (LOVENOX) injection 40 mg Start: 08/06/20 2245   Antimicrobials: None Fluid: Currently normal saline 125 mill per hour Consultants: Psychiatry consult ordered Family Communication: None at bedside  Status is: Inpatient  Remains inpatient appropriate because: Needs IV hydration, placement  Dispo: The patient is from: Home              Anticipated d/c is to: Discharge is pending placement at short term Rehab facility.              Patient currently is not medically stable to d/c.   Difficult to place patient No  Infusions:     Scheduled Meds:  buPROPion  100 mg Oral BID   cholecalciferol  1,000 Units Oral Daily   diltiazem  120 mg Oral Daily   enoxaparin (LOVENOX) injection  40 mg Subcutaneous Q24H   FLUoxetine  40 mg Oral Daily   lamoTRIgine  200 mg Oral BID   latanoprost  1 drop Both Eyes QHS   levothyroxine  50 mcg Oral Q0600   loratadine  10 mg Oral Daily   losartan  50 mg Oral Daily   mometasone-formoterol  2 puff Inhalation BID   montelukast  10 mg Oral Daily   multivitamin-lutein  1 capsule Oral BID   oxyCODONE  10 mg Oral Q6H   senna-docusate  2 tablet Oral Daily   umeclidinium bromide  1 puff Inhalation Daily    Antimicrobials: Anti-infectives (From admission, onward)    None       PRN meds: acetaminophen **OR** acetaminophen, albuterol, fluticasone, ipratropium-albuterol, magnesium hydroxide, melatonin, OLANZapine, ondansetron **OR** ondansetron (ZOFRAN) IV, polyethylene glycol, zolpidem   Objective: Vitals:   08/11/20 0725 08/11/20 1141  BP: (!) 139/57 (!) 130/47  Pulse: 84 87  Resp: 14 16  Temp: 98.8 F (37.1 C) 98.2 F (36.8 C)  SpO2: 95% 96%    Intake/Output Summary (Last 24 hours) at 08/11/2020 1341 Last data filed at 08/11/2020 1229 Gross per 24 hour  Intake --  Output 950 ml  Net -950 ml     Filed Weights   08/06/20 1817 08/07/20 0324  Weight: 63.5 kg 67.6 kg   Weight change:  Body mass index is 29.11 kg/m.   Physical Exam: General exam: Pleasant this am, NAD Skin: No rashes, lesions or ulcers. HEENT: NCAT, PERRL, no icterus Lungs: Clear to auscultation bilaterally CVS: Regular rate and rhythm, no murmur GI/Abd soft, nontender, nondistended, bowel sound present CNS: No acute deficits, generalized weakness. Psychiatry: Mood fluctuates Extremities: No pedal edema, no calf tenderness.  Multiple tender spots related to fibromyalgia  Data Review: I have personally reviewed the laboratory data and studies available.  Recent Labs  Lab 08/06/20 1814 08/07/20 0413 08/08/20 0634 08/09/20 0549 08/10/20 0819  WBC 13.6* 10.3 8.7 10.8* 10.1  NEUTROABS  --   --  5.4 6.9 6.4  HGB 13.4 10.4* 9.3* 11.2* 11.4*  HCT 39.6 31.2* 28.2* 34.2* 34.1*  MCV 91.5 94.0 94.6 96.6  92.4  PLT 373 285 249 258 306    Recent Labs  Lab 08/07/20 0413 08/08/20 0634 08/09/20 0549 08/10/20 0819 08/11/20 0415  NA 139 137 136 137 139  K 3.0* 3.6 4.0 3.2* 3.9  CL 98 105 104 102 107  CO2 28 28 26 27 28   GLUCOSE 75 101* 91 111* 92  BUN 13 10 8  6* 18  CREATININE 0.59 0.60 0.53 0.54 0.83  CALCIUM 8.1* 7.8* 8.7* 8.8* 8.5*  MG  --  1.8  --   --   --   PHOS  --  2.4*  --   --   --      F/u labs ordered Unresulted Labs (From admission, onward)     Start     Ordered   08/11/20 6244  Basic metabolic panel  Daily,   R     Question:  Specimen collection method  Answer:  Lab=Lab collect   08/10/20 2124   08/10/20 0500  Lamotrigine level  Tomorrow morning,   R       Question:  Specimen collection method  Answer:  Lab=Lab collect   08/09/20 2354            Signed, George Hugh, MD [facility] 08/11/2020

## 2020-08-11 NOTE — TOC Progression Note (Signed)
Transition of Care [facility]) - Progression Note    Patient Details  Name: Heidi Fuentes MRN: [id number] Date of Birth: 1945/04/13  Transition of Care [facility]) CM/SW Contact  Izola Price, RN Phone Number: 08/11/2020, 4:50 PM  Clinical Narrative: [facility] has accepted in bed search. Son is hoping to get closer to [place], [place]. Have not heard from [facility] in [place] yet, manually faxed as not in [place]. Simmie Davies RN CM            Expected Discharge Plan and Services                                                 Social Determinants of Health (SDOH) Interventions    Readmission Risk Interventions No flowsheet data found.

## 2020-08-12 LAB — RESP PANEL BY RT-PCR (FLU A&B, COVID) ARPGX2
Influenza A by PCR: NEGATIVE
Influenza B by PCR: NEGATIVE
SARS Coronavirus 2 by RT PCR: NEGATIVE

## 2020-08-12 LAB — LAMOTRIGINE LEVEL: Lamotrigine Lvl: 13 ug/mL (ref 2.0–20.0)

## 2020-08-12 LAB — TSH: TSH: 2.001 u[IU]/mL (ref 0.350–4.500)

## 2020-08-12 MED ORDER — BUPROPION HCL 75 MG PO TABS: 75.0000 mg | ORAL_TABLET | Freq: Two times a day (BID) | ORAL | Status: DC

## 2020-08-12 MED ORDER — MELATONIN 5 MG PO TABS
5.0000 mg | ORAL_TABLET | Freq: Every day | ORAL | Status: DC
  Administered 2020-08-12: 5 mg via ORAL
  Filled 2020-08-12: qty 1

## 2020-08-12 MED ORDER — LOSARTAN POTASSIUM 25 MG PO TABS
25.0000 mg | ORAL_TABLET | Freq: Every day | ORAL | Status: DC
  Administered 2020-08-13: 25 mg via ORAL
  Filled 2020-08-12: qty 1

## 2020-08-12 MED ORDER — BUPROPION HCL 75 MG PO TABS
75.0000 mg | ORAL_TABLET | Freq: Three times a day (TID) | ORAL | Status: DC
  Administered 2020-08-13: 75 mg via ORAL
  Filled 2020-08-12 (×3): qty 1

## 2020-08-12 MED ORDER — PROPRANOLOL HCL 20 MG PO TABS
10.0000 mg | ORAL_TABLET | Freq: Two times a day (BID) | ORAL | Status: DC
  Administered 2020-08-12 – 2020-08-13 (×2): 10 mg via ORAL
  Filled 2020-08-12 (×2): qty 1

## 2020-08-12 NOTE — TOC Progression Note (Signed)
Transition of Care [facility]) - Progression Note    Patient Details  Name: Heidi Fuentes MRN: [id number] Date of Birth: [birth year]  Transition of Care [facility]) CM/SW [place], RN Phone Number: 08/12/2020, 10:26 AM  Clinical Narrative:  Son would like [place] in patient rehab, per therapy and provider, patient is not currently appropriate for inpatient rehab.  Son was notified on Friday.  [place] [facility] contacted, messages left without response.  RNCM called today, and message left on voicemail and with front desk awaiting response.         Expected Discharge Plan and Services                                                 Social Determinants of Health (SDOH) Interventions    Readmission Risk Interventions No flowsheet data found.

## 2020-08-12 NOTE — Progress Notes (Signed)
Physical Therapy Treatment Patient Details Name: Heidi Fuentes MRN: [id number] DOB: [birth year] Today's Date: 08/12/2020    History of Present Illness Pt admitted for anemia with complaints of multiple falls. History includes bipolar, anemia, and recent hospital stay secondary to rhabdo.    PT Comments    Pt received supine in bed with HOB elevated. Agreeable to PT with RN present in room giving pt meds. Per RN pt A&Ox4 however believes she has a room mate in her hospital room. Appears confused throughout treatment with sporadic changes in conversation not related to previous topic being discussed. Prior to OOB mobilization, pt performed supine therex for LE strength and AROM.  Pt progressing in her functional mobility from previous sessions only requiring supervision for bed mobility from supine to seated EOB (HoB elevated however) and ability to STS with no PT assist to RW. Pt stable in her ability to amb 20' within room and able to return to chair with PT supervision. Pt does demonstrate Shakiness in LE's with subjective reports of fatigue during ambulation. Pt continues to require multimodal cuing for sequencing of RW and safe hand placement to optimize safety with transfers and gait. Current d/c rec does remain appropriate due to level of confusion and still demonstrates need for multimodal cuing, generalized LE weakness, and need for supervision for safe ambulation and use of RW.    Follow Up Recommendations  SNF     Equipment Recommendations  Other (comment)    Recommendations for Other Services       Precautions / Restrictions Precautions Precautions: Fall Restrictions Weight Bearing Restrictions: No    Mobility  Bed Mobility Overal bed mobility: Needs Assistance Bed Mobility: Supine to Sit Rolling: Supervision   Supine to sit: Supervision;HOB elevated     General bed mobility comments: Overall supervision for bed mobility. Still requires intermittent VC's forsafe hand  placement.    Transfers Overall transfer level: Needs assistance Equipment used: Rolling walker (2 wheeled) Transfers: Sit to/from Stand Sit to Stand: Supervision         General transfer comment: Pt t/f STS from recliner with supervision. No difficulty however required multimodal cuing for safe hand placement.  Ambulation/Gait Ambulation/Gait assistance: Min guard Gait Distance (Feet): 20 Feet Assistive device: Rolling walker (2 wheeled) Gait Pattern/deviations: Step-to pattern;Decreased stride length;Shuffle Gait velocity: decreased       Stairs             Wheelchair Mobility    Modified Rankin (Stroke Patients Only)       Balance Overall balance assessment: History of Falls;Needs assistance Sitting-balance support: Feet supported;No upper extremity supported Sitting balance-Leahy Scale: Fair Sitting balance - Comments: Able so maintain seated balance with no UE support   Standing balance support: Bilateral upper extremity supported Standing balance-Leahy Scale: Poor Standing balance comment: Supervision for RW in standing. No LOB or sway noted.                            Cognition Arousal/Alertness: Awake/alert   Overall Cognitive Status: No family/caregiver present to determine baseline cognitive functioning                                 General Comments: Per RN pt A&O x4 this a.m. however appears confused asking about room mate in her hospital room      Exercises General Exercises - Lower Extremity Ankle Circles/Pumps: AROM;Both;20  reps;Supine Short Arc Quad: AROM;Strengthening;Both;10 reps;Supine Hip ABduction/ADduction: AROM;Strengthening;Both;10 reps;Supine Straight Leg Raises: AROM;Strengthening;Both;10 reps Toe Raises: Seated;Strengthening;AROM;Both;10 reps    General Comments        Pertinent Vitals/Pain Pain Assessment: No/denies pain Pain Score: 0-No pain    Home Living Family/patient expects to be  discharged to:: Private residence Living Arrangements: Alone Available Help at Discharge: Family;Available PRN/intermittently Type of Home: House Home Access: Ramped entrance   Home Layout: One level Home Equipment: Walker - 2 wheels;Cane - single point;Shower seat;Wheelchair - manual      Prior Function Level of Independence: Independent with assistive device(s)  Gait / Transfers Assistance Needed: able to complete short distance ambulation with SPC at baseline, however since most recent falls has been utilizing WC and RW ADL's / Homemaking Assistance Needed: Pt reports she is able to complete bathing, dressing, and basic self-care tasks independenty.  She endorses completing grocery shopping and going out with friends in the week PTA, however, question if reiliable historian as she demonstrates mild confusion during OT evaluation.     PT Goals (current goals can now be found in the care plan section) Acute Rehab PT Goals Patient Stated Goal: Get stronger and go home PT Goal Formulation: With patient Time For Goal Achievement: 08/21/20 Potential to Achieve Goals: Fair    Frequency    Min 2X/week      PT Plan      Co-evaluation              AM-PAC PT "6 Clicks" Mobility   Outcome Measure  Help needed turning from your back to your side while in a flat bed without using bedrails?: None Help needed moving from lying on your back to sitting on the side of a flat bed without using bedrails?: A Little Help needed moving to and from a bed to a chair (including a wheelchair)?: A Little Help needed standing up from a chair using your arms (e.g., wheelchair or bedside chair)?: A Little Help needed to walk in hospital room?: A Little Help needed climbing 3-5 steps with a railing? : Total 6 Click Score: 17    End of Session Equipment Utilized During Treatment: Gait belt Activity Tolerance: No increased pain Patient left: with call bell/phone within reach;in chair;with chair  alarm set Nurse Communication: Mobility status PT Visit Diagnosis: Unsteadiness on feet (R26.81);Other abnormalities of gait and mobility (R26.89);Muscle weakness (generalized) (M62.81);History of falling (Z91.81)     Time: 9767-3419 PT Time Calculation (min) (ACUTE ONLY): 32 min  Charges:  $Gait Training: 8-22 mins $Therapeutic Exercise: 8-22 mins                     Edelstein M. Fairly IV, PT, DPT Physical Therapist- [facility]  08/12/2020, 10:37 AM

## 2020-08-12 NOTE — TOC Progression Note (Signed)
Transition of Care [facility]) - Progression Note    Patient Details  Name: Heidi Fuentes MRN: [id number] Date of Birth: 1945-08-08  Transition of Care [facility]) CM/SW [place], RN Phone Number: 08/12/2020, 4:38 PM  Clinical Narrative:   [facility] cannot accept patient, as per Pamala Hurry.  [facility] can accept patient first thing in the morning.  COVID test ordered.  Family and care team aware.         Expected Discharge Plan and Services                                                 Social Determinants of Health (SDOH) Interventions    Readmission Risk Interventions No flowsheet data found.

## 2020-08-12 NOTE — TOC Progression Note (Signed)
Transition of Care [facility]) - Progression Note    Patient Details  Name: Heidi Fuentes MRN: [id number] Date of Birth: 05-09-1945  Transition of Care [facility]) CM/SW [place], RN Phone Number: 08/12/2020, 2:34 PM  Clinical Narrative:   Pamala Hurry from [facility] received fax request regarding patient, and is currently reviewing for potential admission, she will get back to me once a decision has been made.         Expected Discharge Plan and Services                                                 Social Determinants of Health (SDOH) Interventions    Readmission Risk Interventions No flowsheet data found.

## 2020-08-12 NOTE — Progress Notes (Addendum)
PROGRESS NOTE  Heidi Fuentes  DOB: [birth year]  PCP: Alfonse Flavors, MD [id number]  DOA: 08/06/2020  LOS: 6 days  Hospital Day: 7   Chief Complaint  Patient presents with   Failure To Thrive    Brief narrative: Heidi Fuentes is a 75 y.o. female with PMH of Bipolar Depression and Chronic Pain Syndrome on multiple psychiatric and pain medications (Lamictal, Bupropion, Prozac, Anti-Histamines, Gabapentin, Tizanidine, and Oxycodone) who was brought to the ED on 7/5 by her son after experiencing an acute manic episode x 1 week after she abruptly stopped taking her medications.  She was recently treated for an acute asthma exacerbation with 2 weeks of Prednisone.    7/2-7/5 Admitted to [facility] for weakness and dehydration found to have mild rhabdomyolysis with CK 4000.  Patient refused SNF and was discharged home. 7/5 Readmitted to [facility].  Subjective: Heidi Fuentes is more pleasant this morning. Her pain is under control. She is working well with PT - motor strength has significantly improved! She has had no more hallucinations. Denies any depression symptoms, SI/HI. Appetite is doing well. She did not sleep well last night.  Assessment/Plan:  Acute Encephalopathy secondary to Polypharmacy, resolved: Patient was on multiple psychiatric meds (occasionally nonadherent) and was also newly prescribed a course of steroids which may have triggered her acute manic episode.   - The concurrent use of Bupropion and SSRI can increase risk of seizures to explain elevated CK - however son and patient do not report any witnessed seizure like activity.   - The combo of Bupropion and SSRI can also increase the risk of panic and acute delirium, which we have seen during hospitalization.  Given acute hallucinations and bilateral tremors associated with Bupropion's dopamine enhancing effect - I decreased Bupropion ER from 200 to 100 mg BID on 7/8.  Decrease Bupropion IR to 75 mg TID starting tomorrow  (discussed with pharmacy). - Give Zyprexa 5 mg PRN for agitation or hallucinations while inpatient. - Continue Fluoxetine 40 mg daily and Lamictal 200 mg daily as these are chronic meds at therapeutic dosages. - Continue Oxycodone as patient has significant back pain and chronic opiate dependence. - I discontinued Meclizine and Gabapentin.   Bilateral Upper Extremity Tremors: - Bupropion is a noradrenergic SSRI known to exacerbate or precipitate physiologic tremors.  Taper Bupropion from 100 mg BID to 75 mg TID.   - Tremors can also be exacerbated by sleep deprivation.  Add on Melatonin to Zolpidem at nighttime. - Lastly, I will check TSH to rule out thyroid disease component.  Patient is on Synthroid 50 mcg daily consistent with age. - Switch Diltiazem to low dose Inderal 10 mg BID.  Only give if SBP > 120 mmHg and HR > 60 beats/min.  If BP tolerates, this can be up-titrated to 20 mg BID.  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  I have tried to taper this medication due to its interaction with Lamictal.  However, patient is in severe pain today.  We will attempt to taper opiates at a later date.  Opiate Related Constipation:  - On Senna and Miralax.   - I will give one more day of Amitiza 24 mcg BID.  She has had 1 large BM thus far.  Acute Mania, resolved, due to medication non-adherence: Per son patient stopped taking her medications for 1-2 weeks and possibly took extra medications once unstable. - Medication adherence may be the main underlying issue  here - we are organizing discharge to short term rehab facility for physical therapy followed by Home with Home Health.   - Medication adherence and pill packing will be important to emphasize moving forward. - Follow up with regular Psychiatrist outpatient.  Acute Mild Rhabdomyolysis, resolved: This may have been due to taking multiple pills of Lamictal / SSRI. - Encourage oral hydration. -  Continue Physical Therapy as tolerated.  Mild Elevation in LFTs - AST/ALT ratio indicates this was related to muscle breakdown: - Monitor.  Bipolar Depression: - Continue Lamictal, Bupropion, and SSRI for now. - Taper Bupropion as tolerated as above.  Frequent Falls / Generalized Weakness: This is due to Polypharmacy plus Physical Deconditioning. - Discharge to short term rehab facility for PT/OT.  Hypertension: - Switch Diltiazem 240 mg to Inderal 10 mg BID. - Decrease Losartan from 50 to 25 mg QD.  Hypothyroidism: - Synthroid 50 mcg daily.  Asthma, not in acute exacerbation: - Inhalers PRN  Mobility: PT eval pending Code Status:   Code Status: Full Code  Nutritional status: Body mass index is 29.11 kg/m.     Diet:  Diet Order             Diet Heart Room service appropriate? Yes; Fluid consistency: Thin  Diet effective now                  DVT prophylaxis:  enoxaparin (LOVENOX) injection 40 mg Start: 08/06/20 2245   Antimicrobials: None Fluid: Currently normal saline 125 mill per hour Consultants: Psychiatry consult ordered Family Communication: None at bedside  Status is: Inpatient  Remains inpatient appropriate because: Needs IV hydration, placement  Dispo: The patient is from: Home              Anticipated d/c is to: Discharge is pending placement at short term Rehab facility.              Patient currently is not medically stable to d/c.   Difficult to place patient No  Infusions:     Scheduled Meds:  buPROPion  100 mg Oral BID   cholecalciferol  1,000 Units Oral Daily   enoxaparin (LOVENOX) injection  40 mg Subcutaneous Q24H   FLUoxetine  40 mg Oral Daily   lamoTRIgine  200 mg Oral BID   latanoprost  1 drop Both Eyes QHS   levothyroxine  50 mcg Oral Q0600   loratadine  10 mg Oral Daily   [START ON 08/13/2020] losartan  25 mg Oral Daily   lubiprostone  24 mcg Oral BID WC   mometasone-formoterol  2 puff Inhalation BID   montelukast  10 mg  Oral Daily   multivitamin-lutein  1 capsule Oral BID   oxyCODONE  10 mg Oral Q6H   propranolol  10 mg Oral BID   senna-docusate  2 tablet Oral Daily   umeclidinium bromide  1 puff Inhalation Daily    Antimicrobials: Anti-infectives (From admission, onward)    None       PRN meds: acetaminophen **OR** acetaminophen, albuterol, fluticasone, ipratropium-albuterol, magnesium hydroxide, melatonin, OLANZapine, ondansetron **OR** ondansetron (ZOFRAN) IV, polyethylene glycol, zolpidem   Objective: Vitals:   08/12/20 0721 08/12/20 1118  BP: (!) 125/47 108/72  Pulse: 81 97  Resp: 16 16  Temp: 98.3 F (36.8 C) 98.4 F (36.9 C)  SpO2: 90% 95%    Intake/Output Summary (Last 24 hours) at 08/12/2020 1200 Last data filed at 08/11/2020 1412 Gross per 24 hour  Intake --  Output 850 ml  Net -850 ml    Filed Weights   08/06/20 1817 08/07/20 0324  Weight: 63.5 kg 67.6 kg   Weight change:  Body mass index is 29.11 kg/m.   Physical Exam: General exam: Pleasant this am, NAD Skin: No rashes, lesions or ulcers. HEENT: NCAT, PERRL, no icterus Lungs: Clear to auscultation bilaterally CVS: Regular rate and rhythm, no murmur GI/Abd soft, nontender, nondistended, bowel sound present CNS: No acute deficits, generalized weakness. Psychiatry: Mood fluctuates Extremities: No pedal edema, no calf tenderness.  Multiple tender spots related to fibromyalgia  Data Review: I have personally reviewed the laboratory data and studies available.  Recent Labs  Lab 08/06/20 1814 08/07/20 0413 08/08/20 0634 08/09/20 0549 08/10/20 0819  WBC 13.6* 10.3 8.7 10.8* 10.1  NEUTROABS  --   --  5.4 6.9 6.4  HGB 13.4 10.4* 9.3* 11.2* 11.4*  HCT 39.6 31.2* 28.2* 34.2* 34.1*  MCV 91.5 94.0 94.6 96.6 92.4  PLT 373 285 249 258 306    Recent Labs  Lab 08/07/20 0413 08/08/20 0634 08/09/20 0549 08/10/20 0819 08/11/20 0415  NA 139 137 136 137 139  K 3.0* 3.6 4.0 3.2* 3.9  CL 98 105 104 102 107  CO2  28 28 26 27 28   GLUCOSE 75 101* 91 111* 92  BUN 13 10 8  6* 18  CREATININE 0.59 0.60 0.53 0.54 0.83  CALCIUM 8.1* 7.8* 8.7* 8.8* 8.5*  MG  --  1.8  --   --   --   PHOS  --  2.4*  --   --   --      F/u labs ordered Unresulted Labs (From admission, onward)     Start     Ordered   08/13/20 0500  CBC  Tomorrow morning,   R       Question:  Specimen collection method  Answer:  Lab=Lab collect   08/12/20 1007   08/12/20 1157  TSH  Add-on,   AD       Question:  Specimen collection method  Answer:  Lab=Lab collect   08/12/20 1156   08/10/20 0500  Lamotrigine level  Tomorrow morning,   R       Question:  Specimen collection method  Answer:  Lab=Lab collect   08/09/20 2354            Signed, George Hugh, MD [facility] 08/12/2020

## 2020-08-12 NOTE — Progress Notes (Signed)
Occupational Therapy Treatment Patient Details Name: Heidi Fuentes MRN: [id number] DOB: [birth year] Today's Date: 08/12/2020    History of present illness Pt admitted for anemia with complaints of multiple falls. History includes bipolar, anemia, and recent hospital stay secondary to rhabdo.   OT comments  Pt seen for OT tx this date. Pt up in recliner, agreeable to OT. Pt provided with set up of care items so she could wash her face, brush her teeth, and clean her partial denture. Pt required intermittent cues as pt was somewhat confused, able to be redirected by therapist. Pt noting she was having more BUE "shakes" today. RN notified. Pt requested to use the [facility]. However, immediately upon standing, pt was incontinent of large amount of urine. Required BUE support for balance, resulting in need for MAX A for pericare. Max A for LB bathing, dressing once in sitting. Pt progressing slowly towards goals. SNF remains most appropriate.     Follow Up Recommendations  SNF;Supervision/Assistance - 24 hour    Equipment Recommendations  3 in 1 bedside commode    Recommendations for Other Services      Precautions / Restrictions Precautions Precautions: Fall Restrictions Weight Bearing Restrictions: No       Mobility Bed Mobility Overal bed mobility: Needs Assistance Bed Mobility: Supine to Sit Rolling: Supervision   Supine to sit: Supervision;HOB elevated     General bed mobility comments: NT, up in recliner at start and end of session    Transfers Overall transfer level: Needs assistance Equipment used: Rolling walker (2 wheeled) Transfers: Sit to/from Stand Sit to Stand: Min guard         General transfer comment: Pt t/f STS from recliner with supervision. No difficulty however required multimodal cuing for safe hand placement.    Balance Overall balance assessment: History of Falls;Needs assistance Sitting-balance support: Feet supported;No upper extremity  supported Sitting balance-Leahy Scale: Fair Sitting balance - Comments: Able so maintain seated balance with no UE support   Standing balance support: Bilateral upper extremity supported Standing balance-Leahy Scale: Poor Standing balance comment: requires BUE support on RW                           ADL either performed or assessed with clinical judgement   ADL Overall ADL's : Needs assistance/impaired     Grooming: Wash/dry face;Oral care;Sitting;Set up Grooming Details (indicate cue type and reason): set up and occasional cues as pt appears confused, to perform oral care, care for partial denture, and use mouth wash                 Toilet Transfer: Min Psychiatric nurse Details (indicate cue type and reason): attempted to get from recliner to [facility] but pt was incontinent of large amount of urine promptly upon standing up Toileting- Clothing Manipulation and Hygiene: Sit to/from stand;Maximal assistance Toileting - Clothing Manipulation Details (indicate cue type and reason): required  UE support in standing             Vision Patient Visual Report: No change from baseline     Perception     Praxis      Cognition Arousal/Alertness: Awake/alert Behavior During Therapy: WFL for tasks assessed/performed Overall Cognitive Status: No family/caregiver present to determine baseline cognitive functioning  General Comments: pt alert, confused, requires cues as to where she is and process for next steps from hospital        Exercises General Exercises - Lower Extremity Ankle Circles/Pumps: AROM;Both;20 reps;Supine Short Arc Quad: AROM;Strengthening;Both;10 reps;Supine Hip ABduction/ADduction: AROM;Strengthening;Both;10 reps;Supine Straight Leg Raises: AROM;Strengthening;Both;10 reps Toe Raises: Seated;Strengthening;AROM;Both;10 reps   Shoulder Instructions       General Comments      Pertinent Vitals/  Pain       Pain Assessment: No/denies pain Pain Score: 0-No pain  Home Living Family/patient expects to be discharged to:: Private residence Living Arrangements: Alone Available Help at Discharge: Family;Available PRN/intermittently Type of Home: House Home Access: Ramped entrance     Home Layout: One level     Bathroom Shower/Tub: Teacher, early years/pre: Standard Bathroom Accessibility: No   Home Equipment: Environmental consultant - 2 wheels;Cane - single point;Shower seat;Wheelchair - manual          Prior Functioning/Environment Level of Independence: Independent with assistive device(s)  Gait / Transfers Assistance Needed: able to complete short distance ambulation with SPC at baseline, however since most recent falls has been utilizing WC and RW ADL's / Homemaking Assistance Needed: Pt reports she is able to complete bathing, dressing, and basic self-care tasks independenty.  She endorses completing grocery shopping and going out with friends in the week PTA, however, question if reiliable historian as she demonstrates mild confusion during OT evaluation.       Frequency  Min 2X/week        Progress Toward Goals  OT Goals(current goals can now be found in the care plan section)  Progress towards OT goals: Progressing toward goals  Acute Rehab OT Goals Patient Stated Goal: Get stronger and go home OT Goal Formulation: With patient Time For Goal Achievement: 08/21/20 Potential to Achieve Goals: [place] Discharge plan remains appropriate;Frequency remains appropriate    Co-evaluation                 AM-PAC OT "6 Clicks" Daily Activity     Outcome Measure   Help from another person eating meals?: None Help from another person taking care of personal grooming?: A Little Help from another person toileting, which includes using toliet, bedpan, or urinal?: A Lot Help from another person bathing (including washing, rinsing, drying)?: A Lot Help from another  person to put on and taking off regular upper body clothing?: A Little Help from another person to put on and taking off regular lower body clothing?: A Lot 6 Click Score: 16    End of Session    OT Visit Diagnosis: Other abnormalities of gait and mobility (R26.89);Muscle weakness (generalized) (M62.81)   Activity Tolerance Patient tolerated treatment well   Patient Left in chair;with call bell/phone within reach;with chair alarm set   Nurse Communication          Time: 7654-6503 OT Time Calculation (min): 26 min  Charges: OT General Charges $OT Visit: 1 Visit OT Treatments $Self Care/Home Management : 23-37 mins  Hanley Hays, MPH, MS, OTR/L ascom [phone] 08/12/20, 12:42 PM

## 2020-08-13 DIAGNOSIS — G894 Chronic pain syndrome: Secondary | ICD-10-CM

## 2020-08-13 LAB — CBC
HCT: 31.6 % — ABNORMAL LOW (ref 36.0–46.0)
Hemoglobin: 10.3 g/dL — ABNORMAL LOW (ref 12.0–15.0)
MCH: 31.1 pg (ref 26.0–34.0)
MCHC: 32.6 g/dL (ref 30.0–36.0)
MCV: 95.5 fL (ref 80.0–100.0)
Platelets: 326 10*3/uL (ref 150–400)
RBC: 3.31 MIL/uL — ABNORMAL LOW (ref 3.87–5.11)
RDW: 15.1 % (ref 11.5–15.5)
WBC: 10.3 10*3/uL (ref 4.0–10.5)
nRBC: 0 % (ref 0.0–0.2)

## 2020-08-13 MED ORDER — OXYCODONE HCL 10 MG PO TABS: 10.0000 mg | ORAL_TABLET | Freq: Four times a day (QID) | ORAL | 0 refills | Status: DC | PRN

## 2020-08-13 MED ORDER — LOSARTAN POTASSIUM 25 MG PO TABS: 25.0000 mg | ORAL_TABLET | Freq: Every day | ORAL | Status: DC

## 2020-08-13 MED ORDER — PROPRANOLOL HCL 10 MG PO TABS
10.0000 mg | ORAL_TABLET | Freq: Two times a day (BID) | ORAL | Status: DC
Start: 2020-08-13 — End: 2022-06-03

## 2020-08-13 MED ORDER — BUPROPION HCL 75 MG PO TABS
75.0000 mg | ORAL_TABLET | Freq: Three times a day (TID) | ORAL | Status: DC
Start: 2020-08-13 — End: 2022-07-28

## 2020-08-13 NOTE — Progress Notes (Signed)
Physical Therapy Treatment Patient Details Name: Heidi Fuentes MRN: [id number] DOB: Jun 03, 1945 Today's Date: 08/13/2020    History of Present Illness Pt admitted for anemia with complaints of multiple falls. History includes bipolar, anemia, and recent hospital stay secondary to rhabdo.    PT Comments    Pt received supine with Hob elevated and agreeable to PT services. T/f from supine to seated EOB with HoB elevated and reliance on bed rails and increased time to scoot to sitting. Supervision for STS to RW with poor understanding and carryover from last session of safe hand placement with standing relying on max mulitmodal cuing. Pt amb 25' with heavy reliance on RW despite cuing to rely on LE's. Pt returned to recliner. Throughout gait and stand <> sit t/f pt demonstrates poor ability to sequence turning RW with maintaining body within BOS of RW and safe hand placement with returning to sitting. Pt educated on techniques to improve STS in recliner to RW after subjective reports of difficulty. Performed 1 rep STS and stand <> sit with limited carryover in safe UE use returning to seated with no use of Ue's on chair rails or backing up til LE's touching back of recliner surface with poor eccentric control into recliner. Pt educated SNF remains appropriate to optimize safety with functional mobility, use of appropriate AD's, and improving strength in LE's.   Follow Up Recommendations  SNF     Equipment Recommendations  Other (comment) (defer to next venue of care)    Recommendations for Other Services       Precautions / Restrictions Precautions Precautions: Fall Restrictions Weight Bearing Restrictions: No    Mobility  Bed Mobility Overal bed mobility: Needs Assistance Bed Mobility: Supine to Sit Rolling: Supervision   Supine to sit: Supervision;HOB elevated     General bed mobility comments: Returned to recliner post session    Transfers Overall transfer level: Needs  assistance Equipment used: Standard walker Transfers: Sit to/from Stand Sit to Stand: Supervision         General transfer comment: Pt t/f STS from recliner with supervision. No difficulty however required multimodal cuing for safe hand placement.  Ambulation/Gait Ambulation/Gait assistance: Min guard Gait Distance (Feet): 70 Feet Assistive device: Rolling walker (2 wheeled) Gait Pattern/deviations: Step-to pattern;Decreased stride length;Shuffle     General Gait Details: Pt demonstrates poor ability to sequence turning RW and remaining withint BOS of RW with max multimodal cuing. Confused on the process.   Stairs             Wheelchair Mobility    Modified Rankin (Stroke Patients Only)       Balance Overall balance assessment: History of Falls;Needs assistance Sitting-balance support: Feet supported;No upper extremity supported Sitting balance-Leahy Scale: Fair Sitting balance - Comments: Able so maintain seated balance with no UE support   Standing balance support: Bilateral upper extremity supported Standing balance-Leahy Scale: Poor Standing balance comment: requires BUE support on RW                            Cognition Arousal/Alertness: Awake/alert Behavior During Therapy: WFL for tasks assessed/performed Overall Cognitive Status: No family/caregiver present to determine baseline cognitive functioning                                 General Comments: Pt alert but remains confused  with sequencing of RW during ambulation.  Exercises Other Exercises Other Exercises: education provided on safe turning with RW and stand <> sit t/f with correct, safe hand placement.    General Comments        Pertinent Vitals/Pain Pain Assessment: No/denies pain    Home Living Family/patient expects to be discharged to:: Private residence Living Arrangements: Alone Available Help at Discharge: Family;Available PRN/intermittently Type  of Home: House Home Access: Ramped entrance   Home Layout: One level Home Equipment: Walker - 2 wheels;Cane - single point;Shower seat;Wheelchair - manual      Prior Function Level of Independence: Independent with assistive device(s)  Gait / Transfers Assistance Needed: able to complete short distance ambulation with SPC at baseline, however since most recent falls has been utilizing WC and RW ADL's / Homemaking Assistance Needed: Pt reports she is able to complete bathing, dressing, and basic self-care tasks independenty.  She endorses completing grocery shopping and going out with friends in the week PTA, however, question if reiliable historian as she demonstrates mild confusion during OT evaluation.     PT Goals (current goals can now be found in the care plan section) Acute Rehab PT Goals Patient Stated Goal: Get stronger and go home PT Goal Formulation: With patient Time For Goal Achievement: 08/21/20 Potential to Achieve Goals: Fair    Frequency    Min 2X/week      PT Plan      Co-evaluation              AM-PAC PT "6 Clicks" Mobility   Outcome Measure  Help needed turning from your back to your side while in a flat bed without using bedrails?: None Help needed moving from lying on your back to sitting on the side of a flat bed without using bedrails?: A Little Help needed moving to and from a bed to a chair (including a wheelchair)?: A Little Help needed standing up from a chair using your arms (e.g., wheelchair or bedside chair)?: A Little Help needed to walk in hospital room?: A Little Help needed climbing 3-5 steps with a railing? : Total 6 Click Score: 17    End of Session Equipment Utilized During Treatment: Gait belt Activity Tolerance: No increased pain Patient left: in chair;with call bell/phone within reach;with nursing/sitter in room;with chair alarm set Nurse Communication: Mobility status PT Visit Diagnosis: Unsteadiness on feet (R26.81);Other  abnormalities of gait and mobility (R26.89);Muscle weakness (generalized) (M62.81);History of falling (Z91.81)     Time: 7903-8333 PT Time Calculation (min) (ACUTE ONLY): 24 min  Charges:  $Gait Training: 23-37 mins                    Salem Caster. [place] IV, PT, DPT Physical Therapist- [facility]  08/13/2020, 11:32 AM

## 2020-08-13 NOTE — TOC Transition Note (Signed)
Transition of Care [facility]) - CM/SW Discharge Note   Patient Details  Name: Heidi Fuentes MRN: [id number] Date of Birth: 1946-01-24  [facility]) CM/SW Contact:  Pete Pelt, RN Phone Number: 08/13/2020, 10:16 AM   Clinical Narrative:   Patient will go to [facility] today, room 808p per Tammy at [facility].  [facility] will pick patient up at 130 pm.  Patient, family and care team aware.          Patient Goals and CMS Choice        Discharge Placement                       Discharge Plan and Services                                     Social Determinants of Health (SDOH) Interventions     Readmission Risk Interventions No flowsheet data found.

## 2020-08-13 NOTE — Progress Notes (Signed)
Discharge instructions provided to facility. Report called to Caryl Pina, LPN at facility. Pts prescriptions sent with discharge packet. All belongings sent with patient. Pt transported by [facility]. VSS.   08/13/20 1111  Vitals  Temp 97.7 F (36.5 C)  Temp Source Oral  BP 123/60  MAP (mmHg) 78  BP Location Left Arm  BP Method Automatic  Patient Position (if appropriate) Sitting  Pulse Rate 69  Pulse Rate Source Monitor  Resp 18  MEWS COLOR  MEWS Score Color Green  Oxygen Therapy  SpO2 95 %

## 2020-08-13 NOTE — Discharge Summary (Signed)
Physician Discharge Summary  Heidi Fuentes:[id number] DOB: 1946-01-28 DOA: 08/06/2020  PCP: Alfonse Flavors, MD  Admit date: 08/06/2020 Discharge date: 08/13/2020  Admitted From: Home  Disposition:  SNF   Recommendations for Outpatient Follow-up:  Follow up with PCP in 1-2 weeks after discharge from [facility]: N/A  Equipment/Devices: TBD at SNF  Discharge Condition: Fair  CODE STATUS: FULL Diet recommendation: Regular  Brief/Interim Summary: Mrs. Heidi Fuentes is a 75 y.o. F with chronic pain on daily opioids, hypothyroidism, hypertension, depression, asthma, and bipolar depression who presented with recurrent falls.  Patient had recently been admitted to [facility] for falls and rhabdo, was recommended to go to SNF but refused with her son.  She went home but failed and was brought back to the hospital the next day.  In the ER, CK elevated, renal function normal.     PRINCIPAL HOSPITAL DIAGNOSIS: Acute metabolic encephalopathy due to polypharmacy and rhabdomyolysis    Discharge Diagnoses:  Acute metabolic encephalopathy due to polypharmacy and rhabdomyolysis At baseline the patient is independent, has no concerns for memory impairment.  On admission she was agitated and disoriented from delirium.  Concurrent use of bupropion and SSRIs can increase the risk of seizures, panic attacks, and delirium.  Bupropion dose was reduced due to its noradrenergic effect, likely exacerbating her delirium.  The above medication adjustments were made, meclizine and gabapentin were discontinued, and delirium resolved.  Essential tremor Bupropion is a noradrenergic SSRI known to exacerbate physiologic tremor.  Bupropion dose was reduced.  TSH normal.  Diltiazem switched to Inderal.  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  This was tried to be tapered during this hospitalization due to interaction with Lamictal but  the patient's pain made this impossible.  Recommended considering taper as an outpatient very gradually, 10 %/week over weeks.  Opiate related constipation Continue senna and MiraLAX  Acute mania in the setting of chronic bipolar disorder Likely due to medications adherence as the patient's son described she had stopped taking her medicines several weeks prior to the first admission, and had recently had a course of prednisone for asthma.  Her mental status returned to normal after resuming her medicines at the updated doses below: Fluoxetine 40 mg continued Lamictal 200 mg continued Bupropion decreased to 75 mg Gabapentin stopped  Acute mild rhabdomyolysis Mild, resolved.  In the setting of taking Lamictal and SSRI as well as falls and dehydration.  Transaminitis Purely due to rhabdomyolysis  Hypertension Blood pressure controlled.  Diltiazem changed to Inderal.  Losartan decreased.  Hypothyroidism TSH normal.  Continue levothyroxine  Asthma No evidence of flare, continue home ICS/LABA/LAMA            Discharge Instructions  Discharge Instructions     Increase activity slowly   Complete by: As directed    No wound care   Complete by: As directed       Allergies as of 08/13/2020       Reactions   Abilify [aripiprazole] Other (See Comments)   Bad dreams   Geodon [ziprasidone Hcl] Other (See Comments)   hospitalization specifics   Quetiapine Palpitations   Sulfa Antibiotics Rash   Trazodone And Nefazodone Other (See Comments)   Reaction is unknown        Medication List     STOP taking these medications    Biotin 10000 MCG Tabs   buPROPion 200  MG 12 hr tablet Commonly known as: WELLBUTRIN SR Replaced by: buPROPion 75 MG tablet   diltiazem 120 MG 24 hr capsule Commonly known as: Cardizem CD   gabapentin 100 MG capsule Commonly known as: NEURONTIN   tiZANidine 4 MG tablet Commonly known as: ZANAFLEX       TAKE these medications     acetaminophen 500 MG tablet Commonly known as: TYLENOL Take 500 mg by mouth every 6 (six) hours as needed for mild pain or moderate pain. For pain   albuterol 108 (90 Base) MCG/ACT inhaler Commonly known as: VENTOLIN HFA Inhale 2 puffs into the lungs every 6 (six) hours as needed for wheezing or shortness of breath. For shortness of breath   Breztri Aerosphere 160-9-4.8 MCG/ACT Aero Generic drug: Budeson-Glycopyrrol-Formoterol Inhale 2 puffs into the lungs in the morning and at bedtime.   buPROPion 75 MG tablet Commonly known as: WELLBUTRIN Take 1 tablet (75 mg total) by mouth 3 (three) times daily. Replaces: buPROPion 200 MG 12 hr tablet   cetirizine 10 MG tablet Commonly known as: ZYRTEC Take 10 mg by mouth daily.   cholecalciferol 25 MCG (1000 UNIT) tablet Commonly known as: VITAMIN D3 Take 1,000 Units by mouth daily.   FLUoxetine 40 MG capsule Commonly known as: PROZAC Take 40 mg by mouth daily.   fluticasone 50 MCG/ACT nasal spray Commonly known as: FLONASE Place 2 sprays into both nostrils daily as needed for allergies.   ipratropium-albuterol 0.5-2.5 (3) MG/3ML Soln Commonly known as: DUONEB Inhale 3 mLs into the lungs every 6 (six) hours as needed (shortness of breath).   lamoTRIgine 200 MG tablet Commonly known as: LAMICTAL Take 200 mg by mouth 2 (two) times daily.   latanoprost 0.005 % ophthalmic solution Commonly known as: XALATAN Place 1 drop into both eyes at bedtime.   levothyroxine 50 MCG tablet Commonly known as: SYNTHROID Take 50 mcg by mouth daily.   losartan 25 MG tablet Commonly known as: COZAAR Take 1 tablet (25 mg total) by mouth daily. Start taking on: August 14, 2020 What changed:  medication strength how much to take   meclizine 25 MG tablet Commonly known as: ANTIVERT Take 25 mg by mouth 3 (three) times daily as needed for dizziness.   Melatonin 3 MG Caps Take 3 mg by mouth at bedtime as needed (for sleep).   montelukast 10  MG tablet Commonly known as: SINGULAIR Take 10 mg by mouth daily.   Oxycodone HCl 10 MG Tabs Take 1 tablet (10 mg total) by mouth every 6 (six) hours as needed (for pain). What changed:  when to take this reasons to take this   polyethylene glycol powder 17 GM/SCOOP powder Commonly known as: GLYCOLAX/MIRALAX Take 17 g by mouth daily as needed for mild constipation or moderate constipation.   potassium chloride SA 20 MEQ tablet Commonly known as: KLOR-CON Take 1 tablet (20 mEq total) by mouth daily.   PRESERVISION/LUTEIN PO Take 1 tablet by mouth 2 (two) times daily.   propranolol 10 MG tablet Commonly known as: INDERAL Take 1 tablet (10 mg total) by mouth 2 (two) times daily.        Follow-up Information     Zhou-Talbert, Elwyn Lade, MD. Schedule an appointment as soon as possible for a visit in 1 week(s).   Specialty: Family Medicine Contact information: [address] [place] [address] [phone]                Allergies  Allergen Reactions  Abilify [Aripiprazole] Other (See Comments)    Bad dreams   Geodon [Ziprasidone Hcl] Other (See Comments)    hospitalization specifics   Quetiapine Palpitations   Sulfa Antibiotics Rash   Trazodone And Nefazodone Other (See Comments)    Reaction is unknown      Procedures/Studies: DG Ribs Unilateral W/Chest Left  Result Date: 08/03/2020 CLINICAL DATA:  Post fall with left-sided chest pain. EXAM: LEFT RIBS AND CHEST - 3+ VIEW COMPARISON:  None. FINDINGS: No fracture or other bone lesions are seen involving the ribs. Multiple overlying monitoring devices in EKG leads partially obscured assessment of lower ribs. There is no evidence of pneumothorax or pleural effusion. Both lungs are clear. Heart size and mediastinal contours are within normal limits. Atherosclerosis of the thoracic aorta. IMPRESSION: Negative radiographs of the chest and left ribs. Electronically Signed   By: Keith Rake M.D.   On:  08/03/2020 17:15   DG Pelvis 1-2 Views  Result Date: 08/03/2020 CLINICAL DATA:  Post fall today. EXAM: PELVIS - 1-2 VIEW COMPARISON:  09/06/2015. FINDINGS: Cortical margins of the bony pelvis are intact. No fracture. Moderate left hip osteoarthritis with joint space narrowing, acetabular and femoral head neck osteophytes. Pubic symphysis and sacroiliac joints are congruent. IMPRESSION: No fracture of the pelvis. Moderate left hip osteoarthritis. Electronically Signed   By: Keith Rake M.D.   On: 08/03/2020 17:16   CT Head Wo Contrast  Result Date: 08/03/2020 CLINICAL DATA:  Fall 3 days prior EXAM: CT HEAD WITHOUT CONTRAST TECHNIQUE: Contiguous axial images were obtained from the base of the skull through the vertex without intravenous contrast. COMPARISON:  CT head and orbits 01/07/2018 FINDINGS: Brain: No evidence of acute infarction, hemorrhage, hydrocephalus, extra-axial collection, visible mass lesion or mass effect. Vascular: No hyperdense vessel or unexpected calcification. Skull: No significant scalp swelling or hematoma. No calvarial fracture. No worrisome osseous lesion. Sinuses/Orbits: Paranasal sinuses and mastoid air cells are predominantly clear. Right-sided nasal septal deviation with a contacting right-sided nasal septal spur. Orbital structures are unremarkable aside from prior lens extractions. Other: None IMPRESSION: No acute intracranial abnormality. No significant scalp swelling or calvarial fracture. Electronically Signed   By: Lovena Le M.D.   On: 08/03/2020 16:44   DG Knee Complete 4 Views Left  Result Date: 08/03/2020 CLINICAL DATA:  Fall. EXAM: LEFT KNEE - COMPLETE 4+ VIEW COMPARISON:  None. FINDINGS: There is a moderate size suprapatellar joint effusion. No acute fracture or dislocation. Medial and lateral compartment joint space narrowing with marginal spur formation identified. IMPRESSION: 1. Moderate suprapatellar joint effusion.  No fracture identified. 2.  Osteoarthritis. Electronically Signed   By: Kerby Moors M.D.   On: 08/03/2020 17:13   DG Knee Complete 4 Views Right  Result Date: 08/03/2020 CLINICAL DATA:  Acute LEFT knee pain following fall today. Initial encounter. EXAM: RIGHT KNEE - COMPLETE 4+ VIEW COMPARISON:  None. FINDINGS: No acute fracture, subluxation or dislocation noted. No joint effusion is present. Joint space narrowing and osteophytosis noted, greatest involving the MEDIAL and LATERAL compartments. No focal bony lesions are present. IMPRESSION: 1. No evidence of acute abnormality. 2. Degenerative changes, greatest involving the MEDIAL and LATERAL compartments. Electronically Signed   By: Margarette Canada M.D.   On: 08/03/2020 17:10      Subjective: Patient is feeling well.  No confusion, no headache, no chest pain.  No abdominal pain, no dyspnea.  No wheezing.  Discharge Exam: Vitals:   08/13/20 0323 08/13/20 0736  BP: 105/88 126/61  Pulse: 68  73  Resp: 16 17  Temp: 98 F (36.7 C) 98.4 F (36.9 C)  SpO2: 92% 93%   Vitals:   08/12/20 1651 08/12/20 2029 08/13/20 0323 08/13/20 0736  BP: (!) 123/52 (!) 140/54 105/88 126/61  Pulse: 92 90 68 73  Resp: 17 16 16 17   Temp: 98.6 F (37 C) 98.2 F (36.8 C) 98 F (36.7 C) 98.4 F (36.9 C)  TempSrc:    Oral  SpO2: 96% 97% 92% 93%  Weight:      Height:        General: Pt is alert, awake, not in acute distress, sitting up in bed Cardiovascular: RRR, nl S1-S2, no murmurs appreciated.   No LE edema.   Respiratory: Normal respiratory rate and rhythm.  CTAB without rales or wheezes. Abdominal: Abdomen soft and non-tender.  No distension or HSM.   Neuro/Psych: Strength symmetric in upper and lower extremities.  Judgment and insight appear normal.   The results of significant diagnostics from this hospitalization (including imaging, microbiology, ancillary and laboratory) are listed below for reference.     Microbiology: Recent Results (from the past 240 hour(s))  Resp  Panel by RT-PCR (Flu A&B, Covid) Nasopharyngeal Swab     Status: None   Collection Time: 08/03/20  6:16 PM   Specimen: Nasopharyngeal Swab; Nasopharyngeal(NP) swabs in vial transport medium  Result Value Ref Range Status   SARS Coronavirus 2 by RT PCR NEGATIVE NEGATIVE Final    Comment: (NOTE) SARS-CoV-2 target nucleic acids are NOT DETECTED.  The SARS-CoV-2 RNA is generally detectable in upper respiratory specimens during the acute phase of infection. The lowest concentration of SARS-CoV-2 viral copies this assay can detect is 138 copies/mL. A negative result does not preclude SARS-Cov-2 infection and should not be used as the sole basis for treatment or other patient management decisions. A negative result may occur with  improper specimen collection/handling, submission of specimen other than nasopharyngeal swab, presence of viral mutation(s) within the areas targeted by this assay, and inadequate number of viral copies(<138 copies/mL). A negative result must be combined with clinical observations, patient history, and epidemiological information. The expected result is Negative.  Fact Sheet for Patients:  EntrepreneurPulse.com.au  Fact Sheet for Healthcare Providers:  IncredibleEmployment.be  This test is no t yet approved or cleared by the [place] FDA and  has been authorized for detection and/or diagnosis of SARS-CoV-2 by FDA under an Emergency Use Authorization (EUA). This EUA will remain  in effect (meaning this test can be used) for the duration of the COVID-19 declaration under Section 564(b)(1) of the Act, 21 U.S.C.section 360bbb-3(b)(1), unless the authorization is terminated  or revoked sooner.       Influenza A by PCR NEGATIVE NEGATIVE Final   Influenza B by PCR NEGATIVE NEGATIVE Final    Comment: (NOTE) The Xpert Xpress SARS-CoV-2/FLU/RSV plus assay is intended as an aid in the diagnosis of influenza from Nasopharyngeal  swab specimens and should not be used as a sole basis for treatment. Nasal washings and aspirates are unacceptable for Xpert Xpress SARS-CoV-2/FLU/RSV testing.  Fact Sheet for Patients: EntrepreneurPulse.com.au  Fact Sheet for Healthcare Providers: IncredibleEmployment.be  This test is not yet approved or cleared by the [place] FDA and has been authorized for detection and/or diagnosis of SARS-CoV-2 by FDA under an Emergency Use Authorization (EUA). This EUA will remain in effect (meaning this test can be used) for the duration of the COVID-19 declaration under Section 564(b)(1) of the Act, 21 U.S.C. section 360bbb-3(b)(1),  unless the authorization is terminated or revoked.  Performed at [facility], [address], [place], [place] [address]   Urine culture     Status: Abnormal   Collection Time: 08/04/20  6:29 AM   Specimen: Urine, Clean Catch  Result Value Ref Range Status   Specimen Description   Final    URINE, CLEAN CATCH Performed at [facility], [address], [place], [place] [address]    Special Requests   Final    NONE Performed at [facility], [address], [place], [place] [address]    Culture MULTIPLE SPECIES PRESENT, SUGGEST RECOLLECTION (A)  Final   Report Status 08/05/2020 FINAL  Final  SARS CORONAVIRUS 2 (TAT 6-24 HRS) Nasopharyngeal Nasopharyngeal Swab     Status: None   Collection Time: 08/06/20  6:14 PM   Specimen: Nasopharyngeal Swab  Result Value Ref Range Status   SARS Coronavirus 2 NEGATIVE NEGATIVE Final    Comment: (NOTE) SARS-CoV-2 target nucleic acids are NOT DETECTED.  The SARS-CoV-2 RNA is generally detectable in upper and lower respiratory specimens during the acute phase of infection. Negative results do not preclude SARS-CoV-2 infection, do not rule out co-infections with other pathogens, and should not be used as the sole basis for treatment or other patient management decisions. Negative  results must be combined with clinical observations, patient history, and epidemiological information. The expected result is Negative.  Fact Sheet for Patients: SugarRoll.be  Fact Sheet for Healthcare Providers: https://www.woods-mathews.com/  This test is not yet approved or cleared by the [place] FDA and  has been authorized for detection and/or diagnosis of SARS-CoV-2 by FDA under an Emergency Use Authorization (EUA). This EUA will remain  in effect (meaning this test can be used) for the duration of the COVID-19 declaration under Se ction 564(b)(1) of the Act, 21 U.S.C. section 360bbb-3(b)(1), unless the authorization is terminated or revoked sooner.  Performed at [facility], [address] [place], [place] [address]   Resp Panel by RT-PCR (Flu A&B, Covid) Nasopharyngeal Swab     Status: None   Collection Time: 08/12/20  6:38 PM   Specimen: Nasopharyngeal Swab; Nasopharyngeal(NP) swabs in vial transport medium  Result Value Ref Range Status   SARS Coronavirus 2 by RT PCR NEGATIVE NEGATIVE Final    Comment: (NOTE) SARS-CoV-2 target nucleic acids are NOT DETECTED.  The SARS-CoV-2 RNA is generally detectable in upper respiratory specimens during the acute phase of infection. The lowest concentration of SARS-CoV-2 viral copies this assay can detect is 138 copies/mL. A negative result does not preclude SARS-Cov-2 infection and should not be used as the sole basis for treatment or other patient management decisions. A negative result may occur with  improper specimen collection/handling, submission of specimen other than nasopharyngeal swab, presence of viral mutation(s) within the areas targeted by this assay, and inadequate number of viral copies(<138 copies/mL). A negative result must be combined with clinical observations, patient history, and epidemiological information. The expected result is Negative.  Fact Sheet  for Patients:  EntrepreneurPulse.com.au  Fact Sheet for Healthcare Providers:  IncredibleEmployment.be  This test is no t yet approved or cleared by the [place] FDA and  has been authorized for detection and/or diagnosis of SARS-CoV-2 by FDA under an Emergency Use Authorization (EUA). This EUA will remain  in effect (meaning this test can be used) for the duration of the COVID-19 declaration under Section 564(b)(1) of the Act, 21 U.S.C.section 360bbb-3(b)(1), unless the authorization is terminated  or revoked  sooner.       Influenza A by PCR NEGATIVE NEGATIVE Final   Influenza B by PCR NEGATIVE NEGATIVE Final    Comment: (NOTE) The Xpert Xpress SARS-CoV-2/FLU/RSV plus assay is intended as an aid in the diagnosis of influenza from Nasopharyngeal swab specimens and should not be used as a sole basis for treatment. Nasal washings and aspirates are unacceptable for Xpert Xpress SARS-CoV-2/FLU/RSV testing.  Fact Sheet for Patients: EntrepreneurPulse.com.au  Fact Sheet for Healthcare Providers: IncredibleEmployment.be  This test is not yet approved or cleared by the [place] FDA and has been authorized for detection and/or diagnosis of SARS-CoV-2 by FDA under an Emergency Use Authorization (EUA). This EUA will remain in effect (meaning this test can be used) for the duration of the COVID-19 declaration under Section 564(b)(1) of the Act, 21 U.S.C. section 360bbb-3(b)(1), unless the authorization is terminated or revoked.  Performed at [facility], [address] [place], [place] [address]      Labs: BNP (last 3 results) Recent Labs    01/31/20 0121  BNP 27.0   Basic Metabolic Panel: Recent Labs  Lab 08/07/20 0413 08/08/20 0634 08/09/20 0549 08/10/20 0819 08/11/20 0415  NA 139 137 136 137 139  K 3.0* 3.6 4.0 3.2* 3.9  CL 98 105 104 102 107  CO2 28 28 26 27 28   GLUCOSE  75 101* 91 111* 92  BUN 13 10 8  6* 18  CREATININE 0.59 0.60 0.53 0.54 0.83  CALCIUM 8.1* 7.8* 8.7* 8.8* 8.5*  MG  --  1.8  --   --   --   PHOS  --  2.4*  --   --   --    Liver Function Tests: Recent Labs  Lab 08/06/20 1814  AST 222*  ALT 175*  ALKPHOS 56  BILITOT 1.7*  PROT 6.3*  ALBUMIN 3.7   No results for input(s): LIPASE, AMYLASE in the last 168 hours. No results for input(s): AMMONIA in the last 168 hours. CBC: Recent Labs  Lab 08/07/20 0413 08/08/20 0634 08/09/20 0549 08/10/20 0819 08/13/20 0449  WBC 10.3 8.7 10.8* 10.1 10.3  NEUTROABS  --  5.4 6.9 6.4  --   HGB 10.4* 9.3* 11.2* 11.4* 10.3*  HCT 31.2* 28.2* 34.2* 34.1* 31.6*  MCV 94.0 94.6 96.6 92.4 95.5  PLT 285 249 258 306 326   Cardiac Enzymes: Recent Labs  Lab 08/06/20 1814 08/07/20 0413 08/08/20 0634 08/10/20 0819  CKTOTAL 4,384* 2,201* 668* 243*   BNP: Invalid input(s): POCBNP CBG: No results for input(s): GLUCAP in the last 168 hours. D-Dimer No results for input(s): DDIMER in the last 72 hours. Hgb A1c No results for input(s): HGBA1C in the last 72 hours. Lipid Profile No results for input(s): CHOL, HDL, LDLCALC, TRIG, CHOLHDL, LDLDIRECT in the last 72 hours. Thyroid function studies Recent Labs    08/11/20 0415  TSH 2.001   Anemia work up No results for input(s): VITAMINB12, FOLATE, FERRITIN, TIBC, IRON, RETICCTPCT in the last 72 hours. Urinalysis    Component Value Date/Time   COLORURINE YELLOW (A) 08/07/2020 0304   APPEARANCEUR CLEAR (A) 08/07/2020 0304   APPEARANCEUR Cloudy 06/02/2012 1042   LABSPEC 1.012 08/07/2020 0304   LABSPEC 1.017 06/02/2012 1042   PHURINE 7.0 08/07/2020 0304   GLUCOSEU NEGATIVE 08/07/2020 0304   GLUCOSEU Negative 06/02/2012 1042   HGBUR SMALL (A) 08/07/2020 0304   BILIRUBINUR NEGATIVE 08/07/2020 0304   BILIRUBINUR Negative 06/02/2012 1042   KETONESUR 20 (A) 08/07/2020 0304  PROTEINUR NEGATIVE 08/07/2020 0304   UROBILINOGEN 0.2 12/11/2014 1429    NITRITE NEGATIVE 08/07/2020 0304   LEUKOCYTESUR NEGATIVE 08/07/2020 0304   LEUKOCYTESUR 3+ 06/02/2012 1042   Sepsis Labs Invalid input(s): PROCALCITONIN,  WBC,  LACTICIDVEN Microbiology Recent Results (from the past 240 hour(s))  Resp Panel by RT-PCR (Flu A&B, Covid) Nasopharyngeal Swab     Status: None   Collection Time: 08/03/20  6:16 PM   Specimen: Nasopharyngeal Swab; Nasopharyngeal(NP) swabs in vial transport medium  Result Value Ref Range Status   SARS Coronavirus 2 by RT PCR NEGATIVE NEGATIVE Final    Comment: (NOTE) SARS-CoV-2 target nucleic acids are NOT DETECTED.  The SARS-CoV-2 RNA is generally detectable in upper respiratory specimens during the acute phase of infection. The lowest concentration of SARS-CoV-2 viral copies this assay can detect is 138 copies/mL. A negative result does not preclude SARS-Cov-2 infection and should not be used as the sole basis for treatment or other patient management decisions. A negative result may occur with  improper specimen collection/handling, submission of specimen other than nasopharyngeal swab, presence of viral mutation(s) within the areas targeted by this assay, and inadequate number of viral copies(<138 copies/mL). A negative result must be combined with clinical observations, patient history, and epidemiological information. The expected result is Negative.  Fact Sheet for Patients:  EntrepreneurPulse.com.au  Fact Sheet for Healthcare Providers:  IncredibleEmployment.be  This test is no t yet approved or cleared by the [place] FDA and  has been authorized for detection and/or diagnosis of SARS-CoV-2 by FDA under an Emergency Use Authorization (EUA). This EUA will remain  in effect (meaning this test can be used) for the duration of the COVID-19 declaration under Section 564(b)(1) of the Act, 21 U.S.C.section 360bbb-3(b)(1), unless the authorization is terminated  or revoked  sooner.       Influenza A by PCR NEGATIVE NEGATIVE Final   Influenza B by PCR NEGATIVE NEGATIVE Final    Comment: (NOTE) The Xpert Xpress SARS-CoV-2/FLU/RSV plus assay is intended as an aid in the diagnosis of influenza from Nasopharyngeal swab specimens and should not be used as a sole basis for treatment. Nasal washings and aspirates are unacceptable for Xpert Xpress SARS-CoV-2/FLU/RSV testing.  Fact Sheet for Patients: EntrepreneurPulse.com.au  Fact Sheet for Healthcare Providers: IncredibleEmployment.be  This test is not yet approved or cleared by the [place] FDA and has been authorized for detection and/or diagnosis of SARS-CoV-2 by FDA under an Emergency Use Authorization (EUA). This EUA will remain in effect (meaning this test can be used) for the duration of the COVID-19 declaration under Section 564(b)(1) of the Act, 21 U.S.C. section 360bbb-3(b)(1), unless the authorization is terminated or revoked.  Performed at [facility], [address], [place], [place] [address]   Urine culture     Status: Abnormal   Collection Time: 08/04/20  6:29 AM   Specimen: Urine, Clean Catch  Result Value Ref Range Status   Specimen Description   Final    URINE, CLEAN CATCH Performed at [facility], [address] [place], [place] [address]    Special Requests   Final    NONE Performed at [facility] [facility], [address], [place], [place] [address]    Culture MULTIPLE SPECIES PRESENT, SUGGEST RECOLLECTION (A)  Final   Report Status 08/05/2020 FINAL  Final  SARS CORONAVIRUS 2 (TAT 6-24 HRS) Nasopharyngeal Nasopharyngeal Swab     Status: None   Collection Time: 08/06/20  6:14 PM   Specimen: Nasopharyngeal Swab  Result Value  Ref Range Status   SARS Coronavirus 2 NEGATIVE NEGATIVE Final    Comment: (NOTE) SARS-CoV-2 target nucleic acids are NOT DETECTED.  The SARS-CoV-2 RNA is generally detectable in upper and lower respiratory specimens  during the acute phase of infection. Negative results do not preclude SARS-CoV-2 infection, do not rule out co-infections with other pathogens, and should not be used as the sole basis for treatment or other patient management decisions. Negative results must be combined with clinical observations, patient history, and epidemiological information. The expected result is Negative.  Fact Sheet for Patients: SugarRoll.be  Fact Sheet for Healthcare Providers: https://www.woods-mathews.com/  This test is not yet approved or cleared by the [place] FDA and  has been authorized for detection and/or diagnosis of SARS-CoV-2 by FDA under an Emergency Use Authorization (EUA). This EUA will remain  in effect (meaning this test can be used) for the duration of the COVID-19 declaration under Se ction 564(b)(1) of the Act, 21 U.S.C. section 360bbb-3(b)(1), unless the authorization is terminated or revoked sooner.  Performed at [facility], [place] [address], [place], [place] [address]   Resp Panel by RT-PCR (Flu A&B, Covid) Nasopharyngeal Swab     Status: None   Collection Time: 08/12/20  6:38 PM   Specimen: Nasopharyngeal Swab; Nasopharyngeal(NP) swabs in vial transport medium  Result Value Ref Range Status   SARS Coronavirus 2 by RT PCR NEGATIVE NEGATIVE Final    Comment: (NOTE) SARS-CoV-2 target nucleic acids are NOT DETECTED.  The SARS-CoV-2 RNA is generally detectable in upper respiratory specimens during the acute phase of infection. The lowest concentration of SARS-CoV-2 viral copies this assay can detect is 138 copies/mL. A negative result does not preclude SARS-Cov-2 infection and should not be used as the sole basis for treatment or other patient management decisions. A negative result may occur with  improper specimen collection/handling, submission of specimen other than nasopharyngeal swab, presence of viral mutation(s) within  the areas targeted by this assay, and inadequate number of viral copies(<138 copies/mL). A negative result must be combined with clinical observations, patient history, and epidemiological information. The expected result is Negative.  Fact Sheet for Patients:  EntrepreneurPulse.com.au  Fact Sheet for Healthcare Providers:  IncredibleEmployment.be  This test is no t yet approved or cleared by the [place] FDA and  has been authorized for detection and/or diagnosis of SARS-CoV-2 by FDA under an Emergency Use Authorization (EUA). This EUA will remain  in effect (meaning this test can be used) for the duration of the COVID-19 declaration under Section 564(b)(1) of the Act, 21 U.S.C.section 360bbb-3(b)(1), unless the authorization is terminated  or revoked sooner.       Influenza A by PCR NEGATIVE NEGATIVE Final   Influenza B by PCR NEGATIVE NEGATIVE Final    Comment: (NOTE) The Xpert Xpress SARS-CoV-2/FLU/RSV plus assay is intended as an aid in the diagnosis of influenza from Nasopharyngeal swab specimens and should not be used as a sole basis for treatment. Nasal washings and aspirates are unacceptable for Xpert Xpress SARS-CoV-2/FLU/RSV testing.  Fact Sheet for Patients: EntrepreneurPulse.com.au  Fact Sheet for Healthcare Providers: IncredibleEmployment.be  This test is not yet approved or cleared by the [place] FDA and has been authorized for detection and/or diagnosis of SARS-CoV-2 by FDA under an Emergency Use Authorization (EUA). This EUA will remain in effect (meaning this test can be used) for the duration of the COVID-19 declaration under Section 564(b)(1) of the Act, 21 U.S.C. section 360bbb-3(b)(1), unless the authorization is  terminated or revoked.  Performed at [facility], [address] [place], [place] [address]      Time coordinating discharge: 25 minutes The   controlled substances registry was reviewed for this patient prior to filling the <5 days supply controlled substances script.    30 Day Unplanned Readmission Risk Score    Flowsheet Row ED to Hosp-Admission (Current) from 08/06/2020 in [place] (1A)  30 Day Unplanned Readmission Risk Score (%) 34.6 Filed at 08/13/2020 0801       This score is the patient's risk of an unplanned readmission within 30 days of being discharged (0 -100%). The score is based on dignosis, age, lab data, medications, orders, and past utilization.   Low:  0-14.9   Medium: 15-21.9   High: 22-29.9   Extreme: 30 and above             SIGNED:   Edwin Dada, MD  [facility] 08/13/2020, 10:20 AM

## 2020-08-13 NOTE — Care Management Important Message (Signed)
Important Message  Patient Details  Name: Heidi Fuentes MRN: [id number] Date of Birth: 12-10-1945   Medicare Important Message Given:  Yes     Juliann Pulse A Hiyab Nhem 08/13/2020, 10:32 AM

## 2020-08-26 ENCOUNTER — Encounter: Payer: Self-pay | Admitting: *Deleted

## 2020-09-11 ENCOUNTER — Encounter: Payer: Self-pay | Admitting: Internal Medicine

## 2020-10-16 ENCOUNTER — Ambulatory Visit: Payer: Medicare Other | Admitting: Gastroenterology

## 2021-01-08 ENCOUNTER — Ambulatory Visit: Payer: Medicare Other | Admitting: Gastroenterology

## 2021-05-10 ENCOUNTER — Encounter: Payer: Self-pay | Admitting: Gastroenterology

## 2021-05-10 NOTE — Progress Notes (Signed)
? ?Referring Provider: Zhou-Talbert, Elwyn Lade, MD ?Primary Care Physician:  Alfonse Flavors, MD ?Primary Gastroenterologist:  Dr. Gala Romney ? ?Chief Complaint  ?Patient presents with  ? Colonoscopy  ?  Prolapsed Rectum, having problems swallowing   ? ? ?HPI:   ?Heidi Fuentes is a 76 y.o. female presenting today at the request of Zhou-Talbert, Elwyn Lade, MD for consult EGD due to progressive dysphagia to solids and occasionally liquids. Patient also wanting to discuss colonoscopy.  ? ?She has history of dysphagia, Schatzki's ring in 2015 s/p dilation, history of large hiatal hernia s/p laparoscopic paraesophageal hernia repair January 2019 by Dr. Abran Richard.  Also with history of adenomatous colon polyp in 2015.  She was due for surveillance colonoscopy in August 2022. ? ?Today:  ? ?Dysphagia:  ?Foods go down esophagus fine. Problem with action of swallowing. This started about 1 year ago. Occurs once every couple of months. Can occur with anything. Not similar to prior symptoms. No GERD symptoms or nausea/vomiting.  ? ?Weight has been fairly stable.  ? ?Has problems with constipation. Chronic. Present for all her life. Has to disimpact herself. Passes small hard stools.  Linzess to 66 previously worked well. Some lower abdominal pain when constipated. MiraLAX nor softeners are helpful. Constipation pre dates pain medications. No brbpr or melena.  ? ?Past Medical History:  ?Diagnosis Date  ? Arthritis   ? Asthma   ? Back pain   ? Chronic pain syndrome 07/10/2013  ? Depression   ? Duodenal papillary stenosis   ? Dyspnea   ? Esophageal dysmotility   ? Fibromyalgia   ? GERD (gastroesophageal reflux disease)   ? Glaucoma   ? H/O wheezing   ? History of hiatal hernia   ? History of palpitations   ? HOH (hard of hearing)   ? HOH (hard of hearing)   ? Hypertension   ? Hypothyroidism   ? Macular degeneration   ? Orthopnea   ? Stenosis, spinal, lumbar   ? Thyroid disease   ? Tubular adenoma   ? 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 ? ?Past Surgical History:  ?Procedure Laterality Date  ? CATARACT EXTRACTION W/PHACO Right 10/06/2016  ? Procedure: CATARACT EXTRACTION PHACO AND INTRAOCULAR LENS PLACEMENT (IOC);  Surgeon: Birder Robson, MD;  Location: [facility] ORS;  Service: Ophthalmology;  Laterality: Right;  [place] 00:32.5 ?AP% 14.5 ?CDE 4.71 ?Fluid pack lot # [id number]  ? CATARACT EXTRACTION W/PHACO Left 11/03/2016  ? Procedure: CATARACT EXTRACTION PHACO AND INTRAOCULAR LENS PLACEMENT (IOC);  Surgeon: Birder Robson, MD;  Location: [facility] ORS;  Service: Ophthalmology;  Laterality: Left;  [place] 00:36.6 ?AP% 16.0 ?CDE 5.86 ?Fluid Pack lot # [id number]  ? COCCYX REMOVAL    ? colonoscopy  2005  ? Dr. Laural Golden: mild melanosis coli, otherwise normal  ? COLONOSCOPY WITH PROPOFOL N/A 09/14/2013  ? TGG:YIRSWNIOE coli. Colonic diverticulosis. Single colonic. Tubular adenoma. Next TCS 09/2020.  ? ERCP  1997  ? [facility]: biliary manometry abnormal, subsequent sphincterotomy   ? ESOPHAGEAL MANOMETRY N/A 12/21/2016  ? Surgeon: Mauri Pole, MD; EG junction outflow obstruction which could be secondary to large hiatal hernia and possible coiling of catheter.  Not consistent with achalasia or other variant.  No major peristaltic motility disorder.  ? ESOPHAGOGASTRODUODENOSCOPY (EGD) WITH PROPOFOL N/A 09/14/2013  ? [id number] ring. Hiatal hernia. Status post Venia Minks and biopsy disruption.   ? FOOT SURGERY    ? X2  ? HEMORROIDECTOMY    ? HERNIA REPAIR    ?  inguinal right  ? HERNIA REPAIR    ? LAPAROSCOPIC PARAESOPHAGEAL HERNIA REPAIR  02/2017  ? Dr. Abran Richard ([place])  ? MALONEY DILATION N/A 09/14/2013  ? Procedure: MALONEY DILATION;  Surgeon: [name], MD;  Location: [facility];  Service: Endoscopy;  Laterality: N/A;  56  ? POLYPECTOMY N/A 09/14/2013  ? Procedure: POLYPECTOMY;  Surgeon: [name], MD;  Location: [facility] ORS;  Service: Endoscopy;  Laterality: N/A;  ? TONSILLECTOMY    ? ? ?Current Outpatient Medications  ?Medication Sig Dispense  Refill  ? acetaminophen (TYLENOL) 500 MG tablet Take 500 mg by mouth every 6 (six) hours as needed for mild pain or moderate pain. For pain    ? albuterol (VENTOLIN HFA) 108 (90 Base) MCG/ACT inhaler Inhale 2 puffs into the lungs every 6 (six) hours as needed for wheezing or shortness of breath. For shortness of breath    ? Budeson-Glycopyrrol-Formoterol (BREZTRI AEROSPHERE) 160-9-4.8 MCG/ACT AERO Inhale 2 puffs into the lungs in the morning and at bedtime. 10.7 g 3  ? buPROPion (WELLBUTRIN) 75 MG tablet Take 1 tablet (75 mg total) by mouth 3 (three) times daily.    ? cholecalciferol (VITAMIN D3) 25 MCG (1000 UNIT) tablet Take 1,000 Units by mouth daily.    ? FLUoxetine (PROZAC) 40 MG capsule Take 40 mg by mouth daily.    ? fluticasone (FLONASE) 50 MCG/ACT nasal spray Place 2 sprays into both nostrils daily as needed for allergies.    ? gabapentin (NEURONTIN) 100 MG capsule Take 100 mg by mouth once. One time daily    ? ipratropium-albuterol (DUONEB) 0.5-2.5 (3) MG/3ML SOLN Inhale 3 mLs into the lungs every 6 (six) hours as needed (shortness of breath).  0  ? lamoTRIgine (LAMICTAL) 200 MG tablet Take 200 mg by mouth 2 (two) times daily.    ? latanoprost (XALATAN) 0.005 % ophthalmic solution Place 1 drop into both eyes at bedtime.    ? levothyroxine (SYNTHROID, LEVOTHROID) 50 MCG tablet Take 50 mcg by mouth daily.    ? losartan (COZAAR) 25 MG tablet Take 1 tablet (25 mg total) by mouth daily.    ? Melatonin 3 MG CAPS Take 3 mg by mouth at bedtime as needed (for sleep).     ? montelukast (SINGULAIR) 10 MG tablet Take 10 mg by mouth daily.    ? Multiple Vitamins-Minerals (PRESERVISION/LUTEIN PO) Take 1 tablet by mouth 2 (two) times daily.     ? Oxycodone HCl 10 MG TABS Take 1 tablet (10 mg total) by mouth every 6 (six) hours as needed (for pain). 12 tablet 0  ? propranolol (INDERAL) 10 MG tablet Take 1 tablet (10 mg total) by mouth 2 (two) times daily.    ? potassium chloride SA (KLOR-CON) 20 MEQ tablet Take 1 tablet  (20 mEq total) by mouth daily. (Patient not taking: Reported on 05/12/2021) 30 tablet 5  ? ?No current facility-administered medications for this visit.  ? ? ?Allergies as of 05/12/2021 - Review Complete 05/12/2021  ?Allergen Reaction Noted  ? Abilify [aripiprazole] Other (See Comments) 10/26/2017  ? Geodon [ziprasidone hcl] Other (See Comments) 08/18/2017  ? Quetiapine Palpitations 02/17/2017  ? Sulfa antibiotics Rash 08/26/2010  ? Trazodone and nefazodone Other (See Comments) 08/18/2017  ? ? ?Family History  ?Problem Relation Age of Onset  ? Lung cancer Mother   ? Heart attack Father   ? Bone cancer Brother   ? COPD Sister   ? Colon cancer Neg Hx   ? ? ?  Social History  ? ?Socioeconomic History  ? Marital status: Divorced  ?  Spouse name: Not on file  ? Number of children: Not on file  ? Years of education: Not on file  ? Highest education level: Not on file  ?Occupational History  ? Not on file  ?Tobacco Use  ? Smoking status: Never  ? Smokeless tobacco: Never  ? Tobacco comments:  ?  NEVER SMOKED  ?Vaping Use  ? Vaping Use: Never used  ?Substance and Sexual Activity  ? Alcohol use: No  ? Drug use: No  ? Sexual activity: Not on file  ?Other Topics Concern  ? Not on file  ?Social History Narrative  ? Not on file  ? ?Social Determinants of Health  ? ?Financial Resource Strain: Not on file  ?Food Insecurity: Not on file  ?Transportation Needs: Not on file  ?Physical Activity: Not on file  ?Stress: Not on file  ?Social Connections: Not on file  ?Intimate Partner Violence: Not on file  ? ? ?Review of Systems: ?Gen: Denies any fever, chills, cold or flulike symptoms, presyncope, syncope. ?CV: Denies chest pain, heart palpitations.  ?Resp: Denies shortness of breath or cough. Admits to SOB.  ?GI: See HPI ?GU : Denies urinary burning, urinary frequency, urinary hesitancy ?MS: Denies joint pain ?Derm: Denies rash ?Psych: Denies depression, anxiety ?Heme: See HPI ? ?Physical Exam: ?BP (!) 152/80   Pulse 66   Temp (!) 97.5  ?F (36.4 ?C) (Temporal)   Ht 5' (1.524 m)   Wt 145 lb (65.8 kg)   BMI 28.32 kg/m?  ?General:   Alert and oriented. Pleasant and cooperative. Well-nourished and well-developed. Walking with a cane.  ?Head

## 2021-05-12 ENCOUNTER — Ambulatory Visit (INDEPENDENT_AMBULATORY_CARE_PROVIDER_SITE_OTHER): Payer: Medicare Other | Admitting: Gastroenterology

## 2021-05-12 ENCOUNTER — Encounter: Payer: Self-pay | Admitting: Gastroenterology

## 2021-05-12 VITALS — BP 152/80 | HR 66 | Temp 97.5°F | Ht 60.0 in | Wt 145.0 lb

## 2021-05-12 DIAGNOSIS — Z86010 Personal history of colonic polyps: Secondary | ICD-10-CM | POA: Diagnosis not present

## 2021-05-12 DIAGNOSIS — Z860101 Personal history of adenomatous and serrated colon polyps: Secondary | ICD-10-CM | POA: Insufficient documentation

## 2021-05-12 DIAGNOSIS — K5900 Constipation, unspecified: Secondary | ICD-10-CM

## 2021-05-12 DIAGNOSIS — R1310 Dysphagia, unspecified: Secondary | ICD-10-CM | POA: Diagnosis not present

## 2021-05-12 HISTORY — DX: Personal history of adenomatous and serrated colon polyps: Z86.0101

## 2021-05-12 NOTE — Patient Instructions (Addendum)
We will arrange to have a colonoscopy in the near future with Dr. Gala Romney. ?Due to your chronic constipation, we will have you complete an extra colon prep and have an extra 1/2-day of clear liquids. ? ? ?For your constipation, start Linzess 290 mcg daily.  ?Unfortunately, I only have samples of Linzess 145 mcg.  Take 2 capsules daily 30 minutes before your first meal.   ?Call with a progress report in 1 week.  If this works well for you, I will send in a prescription. ? ?For your swallowing problems, continue to take small bites, chew thoroughly, avoid tough textures, drink liquids between bites of food. ?Please let me know when you would like to schedule a swallowing study to evaluate this further. ? ?We will follow-up with you in the office after your colonoscopy. ? ?Aliene Altes, PA-C ?[facility] ? ?

## 2021-06-12 ENCOUNTER — Telehealth: Payer: Self-pay

## 2021-06-12 ENCOUNTER — Other Ambulatory Visit: Payer: Self-pay

## 2021-06-12 MED ORDER — PEG 3350-KCL-NA BICARB-NACL 420 G PO SOLR: 8000.0000 mL | ORAL | 0 refills | Status: DC

## 2021-06-12 NOTE — Telephone Encounter (Signed)
Called pt, TCS ASA 3 w/Dr. Gala Romney scheduled for 07/24/21. Rx for 2 preps sent to pharmacy (she needs extra 1/2 prep). Orders entered. ?

## 2021-06-16 NOTE — Telephone Encounter (Signed)
Pre-op appt 07/22/21. Appt letter mailed with procedure instructions. ?

## 2021-07-21 NOTE — Patient Instructions (Signed)
Heidi Fuentes  07/21/2021     @PREFPERIOPPHARMACY @   Your procedure is scheduled on  07/24/2021.   Report to [facility] at   1230 P.M.   Call this number if you have problems the morning of surgery:  [phone]   Remember:  Follow the diet and prep instructions given to you by the office.     Use your nebulizer and your inhaler before you come and bring your rescue inhaler with you.    Take these medicines the morning of surgery with A SIP OF WATER       wellbutrin, prozac, levothyroxine, oxycodone(if needed), inderal.     Do not wear jewelry, make-up or nail polish.  Do not wear lotions, powders, or perfumes, or deodorant.  Do not shave 48 hours prior to surgery.  Men may shave face and neck.  Do not bring valuables to the hospital.  [facility] is not responsible for any belongings or valuables.  Contacts, dentures or bridgework may not be worn into surgery.  Leave your suitcase in the car.  After surgery it may be brought to your room.  For patients admitted to the hospital, discharge time will be determined by your treatment team.  Patients discharged the day of surgery will not be allowed to drive home and must have someone with them for 24 hours.    Special instructions:   DO NOT smoke tobacco or vape fore 24 hors before your procedure.  Please read over the following fact sheets that you were given. Anesthesia Post-op Instructions and Care and Recovery After Surgery      Colonoscopy, Adult, Care After The following information offers guidance on how to care for yourself after your procedure. Your health care provider may also give you more specific instructions. If you have problems or questions, contact your health care provider. What can I expect after the procedure? After the procedure, it is common to have: A small amount of blood in your stool for 24 hours after the procedure. Some gas. Mild cramping or bloating of your abdomen. Follow these  instructions at home: Eating and drinking  Drink enough fluid to keep your urine pale yellow. Follow instructions from your health care provider about eating or drinking restrictions. Resume your normal diet as told by your health care provider. Avoid heavy or fried foods that are hard to digest. Activity Rest as told by your health care provider. Avoid sitting for a long time without moving. Get up to take short walks every 1-2 hours. This is important to improve blood flow and breathing. Ask for help if you feel weak or unsteady. Return to your normal activities as told by your health care provider. Ask your health care provider what activities are safe for you. Managing cramping and bloating  Try walking around when you have cramps or feel bloated. If directed, apply heat to your abdomen as told by your health care provider. Use the heat source that your health care provider recommends, such as a moist heat pack or a heating pad. Place a towel between your skin and the heat source. Leave the heat on for 20-30 minutes. Remove the heat if your skin turns bright red. This is especially important if you are unable to feel pain, heat, or cold. You have a greater risk of getting burned. General instructions If you were given a sedative during the procedure, it can affect you for several hours. Do not drive or operate  machinery until your health care provider says that it is safe. For the first 24 hours after the procedure: Do not sign important documents. Do not drink alcohol. Do your regular daily activities at a slower pace than normal. Eat soft foods that are easy to digest. Take over-the-counter and prescription medicines only as told by your health care provider. Keep all follow-up visits. This is important. Contact a health care provider if: You have blood in your stool 2-3 days after the procedure. Get help right away if: You have more than a small spotting of blood in your  stool. You have large blood clots in your stool. You have swelling of your abdomen. You have nausea or vomiting. You have a fever. You have increasing pain in your abdomen that is not relieved with medicine. These symptoms may be an emergency. Get help right away. Call 911. Do not wait to see if the symptoms will go away. Do not drive yourself to the hospital. Summary After the procedure, it is common to have a small amount of blood in your stool. You may also have mild cramping and bloating of your abdomen. If you were given a sedative during the procedure, it can affect you for several hours. Do not drive or operate machinery until your health care provider says that it is safe. Get help right away if you have a lot of blood in your stool, nausea or vomiting, a fever, or increased pain in your abdomen. This information is not intended to replace advice given to you by your health care provider. Make sure you discuss any questions you have with your health care provider. Document Revised: 09/11/2020 Document Reviewed: 09/11/2020 Elsevier Patient Education  [place] After This sheet gives you information about how to care for yourself after your procedure. Your health care provider may also give you more specific instructions. If you have problems or questions, contact your health care provider. What can I expect after the procedure? After the procedure, it is common to have: Tiredness. Forgetfulness about what happened after the procedure. Impaired judgment for important decisions. Nausea or vomiting. Some difficulty with balance. Follow these instructions at home: For the time period you were told by your health care provider:     Rest as needed. Do not participate in activities where you could fall or become injured. Do not drive or use machinery. Do not drink alcohol. Do not take sleeping pills or medicines that cause drowsiness. Do  not make important decisions or sign legal documents. Do not take care of children on your own. Eating and drinking Follow the diet that is recommended by your health care provider. Drink enough fluid to keep your urine pale yellow. If you vomit: Drink water, juice, or soup when you can drink without vomiting. Make sure you have little or no nausea before eating solid foods. General instructions Have a responsible adult stay with you for the time you are told. It is important to have someone help care for you until you are awake and alert. Take over-the-counter and prescription medicines only as told by your health care provider. If you have sleep apnea, surgery and certain medicines can increase your risk for breathing problems. Follow instructions from your health care provider about wearing your sleep device: Anytime you are sleeping, including during daytime naps. While taking prescription pain medicines, sleeping medicines, or medicines that make you drowsy. Avoid smoking. Keep all follow-up visits as told by your health care  provider. This is important. Contact a health care provider if: You keep feeling nauseous or you keep vomiting. You feel light-headed. You are still sleepy or having trouble with balance after 24 hours. You develop a rash. You have a fever. You have redness or swelling around the IV site. Get help right away if: You have trouble breathing. You have new-onset confusion at home. Summary For several hours after your procedure, you may feel tired. You may also be forgetful and have poor judgment. Have a responsible adult stay with you for the time you are told. It is important to have someone help care for you until you are awake and alert. Rest as told. Do not drive or operate machinery. Do not drink alcohol or take sleeping pills. Get help right away if you have trouble breathing, or if you suddenly become confused. This information is not intended to replace  advice given to you by your health care provider. Make sure you discuss any questions you have with your health care provider. Document Revised: 12/24/2020 Document Reviewed: 12/22/2018 Elsevier Patient Education  [place].

## 2021-07-22 ENCOUNTER — Encounter (HOSPITAL_COMMUNITY)
Admission: RE | Admit: 2021-07-22 | Discharge: 2021-07-22 | Disposition: A | Discharge status: Home / Self Care | Payer: Medicare Other | Source: Ambulatory Visit | Attending: Internal Medicine | Admitting: Internal Medicine

## 2021-07-22 VITALS — BP 113/100 | HR 63 | Temp 97.9°F | Resp 18 | Ht 60.0 in | Wt 145.1 lb

## 2021-07-22 DIAGNOSIS — Z01818 Encounter for other preprocedural examination: Secondary | ICD-10-CM | POA: Diagnosis not present

## 2021-07-22 DIAGNOSIS — I10 Essential (primary) hypertension: Secondary | ICD-10-CM | POA: Insufficient documentation

## 2021-07-22 DIAGNOSIS — Z79899 Other long term (current) drug therapy: Secondary | ICD-10-CM | POA: Insufficient documentation

## 2021-07-22 DIAGNOSIS — D509 Iron deficiency anemia, unspecified: Secondary | ICD-10-CM | POA: Diagnosis not present

## 2021-07-22 LAB — CBC WITH DIFFERENTIAL/PLATELET
Abs Immature Granulocytes: 0.08 10*3/uL — ABNORMAL HIGH (ref 0.00–0.07)
Basophils Absolute: 0.1 10*3/uL (ref 0.0–0.1)
Basophils Relative: 0 %
Eosinophils Absolute: 0.6 10*3/uL — ABNORMAL HIGH (ref 0.0–0.5)
Eosinophils Relative: 5 %
HCT: 38.6 % (ref 36.0–46.0)
Hemoglobin: 12.6 g/dL (ref 12.0–15.0)
Immature Granulocytes: 1 %
Lymphocytes Relative: 19 %
Lymphs Abs: 2.1 10*3/uL (ref 0.7–4.0)
MCH: 31.5 pg (ref 26.0–34.0)
MCHC: 32.6 g/dL (ref 30.0–36.0)
MCV: 96.5 fL (ref 80.0–100.0)
Monocytes Absolute: 1 10*3/uL (ref 0.1–1.0)
Monocytes Relative: 9 %
Neutro Abs: 7.5 10*3/uL (ref 1.7–7.7)
Neutrophils Relative %: 66 %
Platelets: 291 10*3/uL (ref 150–400)
RBC: 4 MIL/uL (ref 3.87–5.11)
RDW: 14.1 % (ref 11.5–15.5)
WBC: 11.4 10*3/uL — ABNORMAL HIGH (ref 4.0–10.5)
nRBC: 0 % (ref 0.0–0.2)

## 2021-07-22 LAB — BASIC METABOLIC PANEL
Anion gap: 9 (ref 5–15)
BUN: 19 mg/dL (ref 8–23)
CO2: 25 mmol/L (ref 22–32)
Calcium: 8.9 mg/dL (ref 8.9–10.3)
Chloride: 105 mmol/L (ref 98–111)
Creatinine, Ser: 1 mg/dL (ref 0.44–1.00)
GFR, Estimated: 58 mL/min — ABNORMAL LOW (ref >60)
Glucose, Bld: 109 mg/dL — ABNORMAL HIGH (ref 70–99)
Potassium: 3.5 mmol/L (ref 3.5–5.1)
Sodium: 139 mmol/L (ref 135–145)

## 2021-07-24 ENCOUNTER — Ambulatory Visit (HOSPITAL_BASED_OUTPATIENT_CLINIC_OR_DEPARTMENT_OTHER): Payer: Medicare Other | Admitting: Anesthesiology

## 2021-07-24 ENCOUNTER — Ambulatory Visit (HOSPITAL_COMMUNITY): Payer: Medicare Other | Admitting: Anesthesiology

## 2021-07-24 ENCOUNTER — Encounter (HOSPITAL_COMMUNITY)
Admission: RE | Disposition: A | Discharge status: Home / Self Care | Payer: Self-pay | Source: Home / Self Care | Attending: Internal Medicine

## 2021-07-24 ENCOUNTER — Encounter (HOSPITAL_COMMUNITY): Payer: Self-pay | Admitting: Internal Medicine

## 2021-07-24 ENCOUNTER — Ambulatory Visit (HOSPITAL_COMMUNITY)
Admission: RE | Admit: 2021-07-24 | Discharge: 2021-07-24 | Disposition: A | Discharge status: Home / Self Care | Payer: Medicare Other | Attending: Internal Medicine | Admitting: Internal Medicine

## 2021-07-24 DIAGNOSIS — J15212 Pneumonia due to Methicillin resistant Staphylococcus aureus: Secondary | ICD-10-CM

## 2021-07-24 DIAGNOSIS — E039 Hypothyroidism, unspecified: Secondary | ICD-10-CM

## 2021-07-24 DIAGNOSIS — Z09 Encounter for follow-up examination after completed treatment for conditions other than malignant neoplasm: Secondary | ICD-10-CM | POA: Diagnosis not present

## 2021-07-24 DIAGNOSIS — R059 Cough, unspecified: Secondary | ICD-10-CM

## 2021-07-24 DIAGNOSIS — F319 Bipolar disorder, unspecified: Secondary | ICD-10-CM | POA: Insufficient documentation

## 2021-07-24 DIAGNOSIS — R0609 Other forms of dyspnea: Secondary | ICD-10-CM

## 2021-07-24 DIAGNOSIS — W19XXXA Unspecified fall, initial encounter: Secondary | ICD-10-CM

## 2021-07-24 DIAGNOSIS — R600 Localized edema: Secondary | ICD-10-CM

## 2021-07-24 DIAGNOSIS — M797 Fibromyalgia: Secondary | ICD-10-CM | POA: Diagnosis not present

## 2021-07-24 DIAGNOSIS — K6389 Other specified diseases of intestine: Secondary | ICD-10-CM | POA: Diagnosis not present

## 2021-07-24 DIAGNOSIS — K5900 Constipation, unspecified: Secondary | ICD-10-CM

## 2021-07-24 DIAGNOSIS — R7989 Other specified abnormal findings of blood chemistry: Secondary | ICD-10-CM

## 2021-07-24 DIAGNOSIS — Z79899 Other long term (current) drug therapy: Secondary | ICD-10-CM | POA: Diagnosis not present

## 2021-07-24 DIAGNOSIS — M1990 Unspecified osteoarthritis, unspecified site: Secondary | ICD-10-CM | POA: Insufficient documentation

## 2021-07-24 DIAGNOSIS — K635 Polyp of colon: Secondary | ICD-10-CM

## 2021-07-24 DIAGNOSIS — E876 Hypokalemia: Secondary | ICD-10-CM

## 2021-07-24 DIAGNOSIS — K529 Noninfective gastroenteritis and colitis, unspecified: Secondary | ICD-10-CM

## 2021-07-24 DIAGNOSIS — I10 Essential (primary) hypertension: Secondary | ICD-10-CM

## 2021-07-24 DIAGNOSIS — R1310 Dysphagia, unspecified: Secondary | ICD-10-CM

## 2021-07-24 DIAGNOSIS — Z86010 Personal history of colonic polyps: Secondary | ICD-10-CM

## 2021-07-24 DIAGNOSIS — D649 Anemia, unspecified: Secondary | ICD-10-CM

## 2021-07-24 DIAGNOSIS — D120 Benign neoplasm of cecum: Secondary | ICD-10-CM | POA: Diagnosis not present

## 2021-07-24 DIAGNOSIS — J479 Bronchiectasis, uncomplicated: Secondary | ICD-10-CM

## 2021-07-24 DIAGNOSIS — K219 Gastro-esophageal reflux disease without esophagitis: Secondary | ICD-10-CM

## 2021-07-24 DIAGNOSIS — J45909 Unspecified asthma, uncomplicated: Secondary | ICD-10-CM

## 2021-07-24 DIAGNOSIS — Z79891 Long term (current) use of opiate analgesic: Secondary | ICD-10-CM

## 2021-07-24 DIAGNOSIS — Q438 Other specified congenital malformations of intestine: Secondary | ICD-10-CM | POA: Insufficient documentation

## 2021-07-24 DIAGNOSIS — Z79890 Hormone replacement therapy (postmenopausal): Secondary | ICD-10-CM | POA: Diagnosis not present

## 2021-07-24 DIAGNOSIS — F3131 Bipolar disorder, current episode depressed, mild: Secondary | ICD-10-CM

## 2021-07-24 DIAGNOSIS — M6282 Rhabdomyolysis: Secondary | ICD-10-CM

## 2021-07-24 DIAGNOSIS — D72829 Elevated white blood cell count, unspecified: Secondary | ICD-10-CM

## 2021-07-24 DIAGNOSIS — R9431 Abnormal electrocardiogram [ECG] [EKG]: Secondary | ICD-10-CM

## 2021-07-24 DIAGNOSIS — E038 Other specified hypothyroidism: Secondary | ICD-10-CM

## 2021-07-24 DIAGNOSIS — Z1211 Encounter for screening for malignant neoplasm of colon: Secondary | ICD-10-CM

## 2021-07-24 DIAGNOSIS — D509 Iron deficiency anemia, unspecified: Secondary | ICD-10-CM

## 2021-07-24 DIAGNOSIS — M1612 Unilateral primary osteoarthritis, left hip: Secondary | ICD-10-CM

## 2021-07-24 DIAGNOSIS — F1190 Opioid use, unspecified, uncomplicated: Secondary | ICD-10-CM

## 2021-07-24 DIAGNOSIS — J310 Chronic rhinitis: Secondary | ICD-10-CM

## 2021-07-24 DIAGNOSIS — S0232XA Fracture of orbital floor, left side, initial encounter for closed fracture: Secondary | ICD-10-CM

## 2021-07-24 DIAGNOSIS — G894 Chronic pain syndrome: Secondary | ICD-10-CM

## 2021-07-24 DIAGNOSIS — H353231 Exudative age-related macular degeneration, bilateral, with active choroidal neovascularization: Secondary | ICD-10-CM

## 2021-07-24 DIAGNOSIS — H4089 Other specified glaucoma: Secondary | ICD-10-CM

## 2021-07-24 DIAGNOSIS — K449 Diaphragmatic hernia without obstruction or gangrene: Secondary | ICD-10-CM

## 2021-07-24 DIAGNOSIS — M25579 Pain in unspecified ankle and joints of unspecified foot: Secondary | ICD-10-CM

## 2021-07-24 DIAGNOSIS — J45901 Unspecified asthma with (acute) exacerbation: Secondary | ICD-10-CM

## 2021-07-24 DIAGNOSIS — Z860101 Personal history of adenomatous and serrated colon polyps: Secondary | ICD-10-CM

## 2021-07-24 DIAGNOSIS — K222 Esophageal obstruction: Secondary | ICD-10-CM

## 2021-07-24 HISTORY — PX: POLYPECTOMY: SHX5525

## 2021-07-24 HISTORY — PX: COLONOSCOPY WITH PROPOFOL: SHX5780

## 2021-07-24 SURGERY — COLONOSCOPY WITH PROPOFOL
Anesthesia: General

## 2021-07-24 MED ORDER — PROPOFOL 500 MG/50ML IV EMUL
INTRAVENOUS | Status: DC | PRN
  Administered 2021-07-24: 100 ug/kg/min via INTRAVENOUS
  Administered 2021-07-24: 120 ug/kg/min via INTRAVENOUS

## 2021-07-24 MED ORDER — PROPOFOL 10 MG/ML IV BOLUS
INTRAVENOUS | Status: DC | PRN
  Administered 2021-07-24: 50 mg via INTRAVENOUS
  Administered 2021-07-24: 100 mg via INTRAVENOUS
  Administered 2021-07-24: 40 mg via INTRAVENOUS
  Administered 2021-07-24: 20 mg via INTRAVENOUS

## 2021-07-24 MED ORDER — LACTATED RINGERS IV SOLN: INTRAVENOUS | Status: DC

## 2021-07-24 NOTE — Anesthesia Procedure Notes (Signed)
Date/Time: 07/24/2021 1:33 PM  Performed by: Vista Deck, CRNAPre-anesthesia Checklist: Patient identified, Emergency Drugs available, Suction available, Timeout performed and Patient being monitored Patient Re-evaluated:Patient Re-evaluated prior to induction Oxygen Delivery Method: Nasal Cannula

## 2021-07-24 NOTE — Anesthesia Preprocedure Evaluation (Signed)
Anesthesia Evaluation  Patient identified by MRN, date of birth, ID band Patient awake    Reviewed: Allergy & Precautions, NPO status , Patient's Chart, lab work & pertinent test results  Airway Mallampati: II  TM Distance: >3 FB Neck ROM: Full    Dental  (+) Dental Advisory Given, Upper Dentures, Missing   Pulmonary shortness of breath and with exertion, asthma , pneumonia,    Pulmonary exam normal breath sounds clear to auscultation       Cardiovascular hypertension, Pt. on medications + Orthopnea and + DOE  Normal cardiovascular exam Rhythm:Regular Rate:Normal  22-Jul-2021 10:47:36 [facility] ROUTINE RECORD Jun 24, 1945 (76 yr) Female Caucasian Vent. rate 63 BPM PR interval 144 ms QRS duration 76 ms QT/QTcB 420/429 ms P-R-T axes 30 -18 17 Normal sinus rhythm Minimal voltage criteria for LVH, may be normal variant ( R in aVL ) Borderline ECG When compared with ECG of 27-Jun-2019 20:08, No significant change was found Confirmed by Sherryl Manges [id number]) on 07/23/2021 2:11:52 PM   Neuro/Psych PSYCHIATRIC DISORDERS Depression Bipolar Disorder  Neuromuscular disease    GI/Hepatic Neg liver ROS, hiatal hernia, GERD  Medicated and Controlled,  Endo/Other  Hypothyroidism   Renal/GU negative Renal ROS  negative genitourinary   Musculoskeletal  (+) Arthritis , Osteoarthritis,  Fibromyalgia -, narcotic dependent  Abdominal   Peds negative pediatric ROS (+)  Hematology  (+) Blood dyscrasia, anemia ,   Anesthesia Other Findings   Reproductive/Obstetrics negative OB ROS                             Anesthesia Physical Anesthesia Plan  ASA: 3  Anesthesia Plan: General   Post-op Pain Management: Minimal or no pain anticipated   Induction: Intravenous  PONV Risk Score and Plan: Propofol infusion  Airway Management Planned: Nasal Cannula and Natural Airway  Additional  Equipment:   Intra-op Plan:   Post-operative Plan:   Informed Consent: I have reviewed the patients History and Physical, chart, labs and discussed the procedure including the risks, benefits and alternatives for the proposed anesthesia with the patient or authorized representative who has indicated his/her understanding and acceptance.     Dental advisory given  Plan Discussed with: CRNA and Surgeon  Anesthesia Plan Comments:         Anesthesia Quick Evaluation

## 2021-07-24 NOTE — Discharge Instructions (Addendum)
  Colonoscopy Discharge Instructions  Read the instructions outlined below and refer to this sheet in the next few weeks. These discharge instructions provide you with general information on caring for yourself after you leave the hospital. Your doctor may also give you specific instructions. While your treatment has been planned according to the most current medical practices available, unavoidable complications occasionally occur. If you have any problems or questions after discharge, call Dr. Gala Romney at [phone]. ACTIVITY You may resume your regular activity, but move at a slower pace for the next 24 hours.  Take frequent rest periods for the next 24 hours.  Walking will help get rid of the air and reduce the bloated feeling in your belly (abdomen).  No driving for 24 hours (because of the medicine (anesthesia) used during the test).   Do not sign any important legal documents or operate any machinery for 24 hours (because of the anesthesia used during the test).  NUTRITION Drink plenty of fluids.  You may resume your normal diet as instructed by your doctor.  Begin with a light meal and progress to your normal diet. Heavy or fried foods are harder to digest and may make you feel sick to your stomach (nauseated).  Avoid alcoholic beverages for 24 hours or as instructed.  MEDICATIONS You may resume your normal medications unless your doctor tells you otherwise.  WHAT YOU CAN EXPECT TODAY Some feelings of bloating in the abdomen.  Passage of more gas than usual.  Spotting of blood in your stool or on the toilet paper.  IF YOU HAD POLYPS REMOVED DURING THE COLONOSCOPY: No aspirin products for 7 days or as instructed.  No alcohol for 7 days or as instructed.  Eat a soft diet for the next 24 hours.  FINDING OUT THE RESULTS OF YOUR TEST Not all test results are available during your visit. If your test results are not back during the visit, make an appointment with your caregiver to find out the  results. Do not assume everything is normal if you have not heard from your caregiver or the medical facility. It is important for you to follow up on all of your test results.  SEEK IMMEDIATE MEDICAL ATTENTION IF: You have more than a spotting of blood in your stool.  Your belly is swollen (abdominal distention).  You are nauseated or vomiting.  You have a temperature over 101.  You have abdominal pain or discomfort that is severe or gets worse throughout the day.     2 polyps removed from your colon today.  Resume Linzess 290 daily.  Go by my office for free samples.  Also new prescription provided  You should see the gynecologist regarding extent of rectocele -discussed surgical repair.  Further recommendations to follow pending review of pathology report  At patient request, I called Enyah Moman at [phone] -reviewed findings and recommendations

## 2021-07-24 NOTE — H&P (Signed)
@LOGO @   Primary Care Physician:  Zhou-Talbert, Elwyn Lade, MD Primary Gastroenterologist:  Dr. Gala Romney  Pre-Procedure History & Physical: HPI:  Heidi Fuentes is a 76 y.o. female here for surveillance colonoscopy.  History colonic adenoma removed 2015.  Only symptom is constipation likely opioid induced.  Managed with Linzess. History of rectocele.  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[facility])     Past Surgical History:  Procedure Laterality Date   CATARACT EXTRACTION W/PHACO Right 10/06/2016   Procedure: CATARACT EXTRACTION PHACO AND INTRAOCULAR LENS PLACEMENT ([facility]);  Surgeon: Birder Robson, MD;  Location: [facility] ORS;  Service: Ophthalmology;  Laterality: Right;  [place] 00:32.5 AP% 14.5 CDE 4.71 Fluid pack lot # [id number]   CATARACT EXTRACTION W/PHACO Left 11/03/2016   Procedure: CATARACT EXTRACTION PHACO AND INTRAOCULAR LENS PLACEMENT ([facility]);  Surgeon: Birder Robson, MD;  Location: [facility] ORS;  Service: Ophthalmology;  Laterality: Left;  [place] 00:36.6 AP% 16.0 CDE 5.86 Fluid Pack lot # [id number]   COCCYX REMOVAL     colonoscopy  2005   Dr. Laural Golden: mild melanosis coli, otherwise normal   COLONOSCOPY WITH PROPOFOL N/A 09/14/2013   JKD:TOIZTIWPY coli. Colonic diverticulosis. Single colonic. Tubular adenoma. Next TCS 09/2020.   ERCP  1997   [facility]: biliary manometry abnormal, subsequent sphincterotomy    ESOPHAGEAL MANOMETRY N/A 12/21/2016   Surgeon: Mauri Pole,  MD; EG junction outflow obstruction which could be secondary to large hiatal hernia and possible coiling of catheter.  Not consistent with achalasia or other variant.  No major peristaltic motility disorder.   ESOPHAGOGASTRODUODENOSCOPY (EGD) WITH PROPOFOL N/A 09/14/2013   KDX:IPJASNKN'L ring. Hiatal hernia. Status post Venia Minks and biopsy disruption.    FOOT SURGERY     X2   HEMORROIDECTOMY     HERNIA REPAIR     inguinal right   HERNIA REPAIR     LAPAROSCOPIC PARAESOPHAGEAL HERNIA REPAIR  02/2017   Dr. Abran Richard [facility])   [place] N/A 09/14/2013   Procedure: Keturah Shavers;  Surgeon: [name], MD;  Location: [facility];  Service: Endoscopy;  Laterality: N/A;  56   POLYPECTOMY N/A 09/14/2013   Procedure: POLYPECTOMY;  Surgeon: [name], MD;  Location: [facility];  Service: Endoscopy;  Laterality: N/A;   TONSILLECTOMY      Prior to Admission medications   Medication Sig Start Date End Date Taking? Authorizing Provider  acetaminophen (TYLENOL) 500 MG tablet Take 500 mg by mouth every 6 (six) hours as needed for mild pain or moderate pain. For pain   Yes [provider]  albuterol (VENTOLIN HFA) 108 (90 Base) MCG/ACT inhaler Inhale 2 puffs into the lungs every 6 (six) hours as needed for wheezing or shortness of breath. For shortness of breath   Yes [provider]  Budeson-Glycopyrrol-Formoterol (BREZTRI AEROSPHERE) 160-9-4.8 MCG/ACT AERO Inhale 2 puffs into the lungs in the morning and at bedtime. 03/18/20  Yes Rigoberto Noel, MD  buPROPion (WELLBUTRIN) 75 MG tablet Take 1 tablet (75 mg total) by mouth 3 (three) times daily. 08/13/20  Yes Danford, Suann Larry, MD  chlorhexidine (PERIDEX) 0.12 % solution Use as directed 15 mLs in the mouth or throat 2 (two) times daily. 07/09/21  Yes [provider]  cholecalciferol (VITAMIN D3) 25 MCG (1000 UNIT) tablet Take 1,000 Units by mouth daily.   Yes [provider]  FLUoxetine (PROZAC) 40 MG  capsule Take 40 mg by mouth daily.   Yes [provider]  fluticasone (FLONASE) 50 MCG/ACT nasal spray Place 2 sprays into both nostrils daily.   Yes [provider]  gabapentin (NEURONTIN) 300 MG capsule Take 300 mg by mouth at bedtime.   Yes [provider]  hydrochlorothiazide (HYDRODIURIL) 12.5 MG tablet Take 12.5 mg by mouth daily as needed (swelling). 03/06/21  Yes [provider]  ipratropium-albuterol (DUONEB) 0.5-2.5 (3) MG/3ML SOLN Inhale 3 mLs into the lungs every 6 (six) hours as needed (shortness of breath). 11/18/17  Yes [provider]  ketoconazole (NIZORAL) 2 % cream Apply 1 Application topically daily as needed for irritation.   Yes [provider]  lamoTRIgine (LAMICTAL) 200 MG tablet Take 200 mg by mouth 2 (two) times daily. 06/05/19  Yes [provider]  latanoprost (XALATAN) 0.005 % ophthalmic solution Place 1 drop into both eyes at bedtime. 04/20/13  Yes [provider]  levothyroxine (SYNTHROID, LEVOTHROID) 50 MCG tablet Take 50 mcg by mouth daily.   Yes [provider]  losartan (COZAAR) 25 MG tablet Take 1 tablet (25 mg total) by mouth daily. 08/14/20  Yes Danford, Suann Larry, MD  melatonin 5 MG TABS Take 5 mg by mouth at bedtime. 11/17/17  Yes [provider]  montelukast (SINGULAIR) 10 MG tablet Take 10 mg by mouth daily. 05/03/13  Yes [provider]  Multiple Vitamins-Minerals (PRESERVISION/LUTEIN PO) Take 1 tablet by mouth 2 (two) times daily.    Yes [provider]  Oxycodone HCl 10 MG TABS Take 1 tablet (10 mg total) by mouth every 6 (six) hours as needed (for pain). 08/13/20  Yes Lavina Hamman, MD  polyethylene glycol-electrolytes (TRILYTE) 420 g solution Take 8,000 mLs by mouth as directed. 06/12/21  Yes Cleotha Tsang, [name], MD  potassium chloride (KLOR-CON) 10 MEQ tablet Take 10 mEq by mouth daily as needed (when taking hctz).   Yes [provider]   propranolol (INDERAL) 10 MG tablet Take 1 tablet (10 mg total) by mouth 2 (two) times daily. 08/13/20  Yes Danford, Suann Larry, MD  potassium chloride SA (KLOR-CON) 20 MEQ tablet Take 1 tablet (20 mEq total) by mouth daily. Patient not taking: Reported on 05/12/2021 08/05/20   Roxan Hockey, MD    Allergies as of 06/12/2021 - Review Complete 05/12/2021  Allergen Reaction Noted   Abilify [aripiprazole] Other (See Comments) 10/26/2017   Geodon [ziprasidone hcl] Other (See Comments) 08/18/2017   Quetiapine Palpitations 02/17/2017   Sulfa antibiotics Rash 08/26/2010   Trazodone and nefazodone Other (See Comments) 08/18/2017    Family History  Problem Relation Age of Onset   Lung cancer Mother    Heart attack Father    Bone cancer Brother    COPD Sister    Colon cancer Neg Hx     Social History   Socioeconomic History   Marital status:  Divorced    Spouse name: Not on file   Number of children: Not on file   Years of education: Not on file   Highest education level: Not on file  Occupational History   Not on file  Tobacco Use   Smoking status: Never   Smokeless tobacco: Never   Tobacco comments:    NEVER SMOKED  Vaping Use   Vaping Use: Never used  Substance and Sexual Activity   Alcohol use: No   Drug use: No   Sexual activity: Not on file  Other Topics Concern   Not on file  Social History Narrative   Not on file   Social Determinants of Health   Financial Resource Strain: Not on file  Food Insecurity: Not on file  Transportation Needs: Not on file  Physical Activity: Not on file  Stress: Not on file  Social Connections: Not on file  Intimate Partner Violence: Not on file    Review of Systems: See HPI, otherwise negative ROS  Physical Exam: BP 140/81   Resp 14   SpO2 100%  General:   Alert,  Well-developed, well-nourished, pleasant and cooperative in NAD Neck:  Supple; no masses or thyromegaly. No significant cervical adenopathy. Lungs:  Clear  throughout to auscultation.   No wheezes, crackles, or rhonchi. No acute distress. Heart:  Regular rate and rhythm; no murmurs, clicks, rubs,  or gallops. Abdomen: Non-distended, normal bowel sounds.  Soft and nontender without appreciable mass or hepatosplenomegaly.  Pulses:  Normal pulses noted. Extremities:  Without clubbing or edema.  Impression/Plan: 76 year old lady with a history of colonic adenoma; here for surveillance colonoscopy. I have offered the patient a surveillance colonoscopy today. The risks, benefits, limitations, alternatives and imponderables have been reviewed with the patient. Questions have been answered. All parties are agreeable.       Notice: This dictation was prepared with Dragon dictation along with smaller phrase technology. Any transcriptional errors that result from this process are unintentional and may not be corrected upon review.

## 2021-07-24 NOTE — Transfer of Care (Signed)
Immediate Anesthesia Transfer of Care Note  Patient: Heidi Fuentes  Procedure(s) Performed: COLONOSCOPY WITH PROPOFOL POLYPECTOMY  Patient Location: Short Stay  Anesthesia Type:General  Level of Consciousness: awake and alert   Airway & Oxygen Therapy: Patient Spontanous Breathing  Post-op Assessment: Report given to RN and Post -op Vital signs reviewed and stable  Post vital signs: Reviewed and stable  Last Vitals:  Vitals Value Taken Time  BP 127/55 1410  Temp 36.6 C 07/24/21 1405  Pulse 74 07/24/21 1405  Resp 17 07/24/21 1405  SpO2 99 1410    Last Pain:  Vitals:   07/24/21 1405  TempSrc: Oral  PainSc:       Patients Stated Pain Goal: 7 (09/81/19 1478)  Complications: No notable events documented.

## 2021-07-24 NOTE — Anesthesia Postprocedure Evaluation (Signed)
Anesthesia Post Note  Patient: LYRIQUE HAKIM  Procedure(s) Performed: COLONOSCOPY WITH PROPOFOL POLYPECTOMY  Patient location during evaluation: Phase II Anesthesia Type: General Level of consciousness: awake and alert and oriented Pain management: pain level controlled Vital Signs Assessment: post-procedure vital signs reviewed and stable Respiratory status: spontaneous breathing, nonlabored ventilation and respiratory function stable Cardiovascular status: blood pressure returned to baseline and stable Postop Assessment: no apparent nausea or vomiting Anesthetic complications: no   No notable events documented.   Last Vitals:  Vitals:   07/24/21 1321 07/24/21 1405  BP: 140/81 (!) 127/55  Pulse:  74  Resp: 14 17  Temp:  36.6 C  SpO2: 100% 99%    Last Pain:  Vitals:   07/24/21 1405  TempSrc: Oral  PainSc: 0-No pain                 Demon Volante C Marlynn Hinckley

## 2021-07-24 NOTE — Op Note (Signed)
[facility] Patient Name: Heidi Fuentes Procedure Date: 07/24/2021 1:16 PM MRN: [id number] Date of Birth: September 12, 1945 Attending MD: Norvel Richards , MD CSN: [id number] Age: 76 Admit Type: Outpatient Procedure:                Colonoscopy Indications:              High risk colon cancer surveillance: Personal                            history of colonic polyps Providers:                Norvel Richards, MD, Janeece Riggers, RN, Dereck Leep, Technician, Everardo Pacific Referring MD:              Medicines:                Propofol per Anesthesia Complications:            No immediate complications. Estimated Blood Loss:     Estimated blood loss was minimal. Estimated blood                            loss was minimal. Procedure:                Pre-Anesthesia Assessment:                           - Prior to the procedure, a History and Physical                            was performed, and patient medications and                            allergies were reviewed. The patient's tolerance of                            previous anesthesia was also reviewed. The risks                            and benefits of the procedure and the sedation                            options and risks were discussed with the patient.                            All questions were answered, and informed consent                            was obtained. Prior Anticoagulants: The patient has                            taken no previous anticoagulant or antiplatelet  agents. ASA Grade Assessment: III - A patient with                            severe systemic disease. After reviewing the risks                            and benefits, the patient was deemed in                            satisfactory condition to undergo the procedure.                           After obtaining informed consent, the colonoscope                            was  passed under direct vision. Throughout the                            procedure, the patient's blood pressure, pulse, and                            oxygen saturations were monitored continuously. The                            [id number]) scope was introduced through the                            anus and advanced to the the cecum, identified by                            appendiceal orifice and ileocecal valve. The                            colonoscopy was performed without difficulty. The                            patient tolerated the procedure well. The quality                            of the bowel preparation was adequate. Scope In: 1:40:58 PM Scope Out: 2:03:25 PM Scope Withdrawal Time: 0 hours 10 minutes 50 seconds  Total Procedure Duration: 0 hours 22 minutes 27 seconds  Findings:      The perianal and digital rectal examinations were normal. Redundant       colon. Melanosis coli present.      Two sessile polyps were found in the cecum. The polyps were 5 to 7 mm in       size. These polyps were removed with a cold snare. Resection and       retrieval were complete. Estimated blood loss was minimal.      The exam was otherwise without abnormality on direct and retroflexion       views. Impression:               - Two 5 to 7 mm polyps in the cecum, removed with  a                            cold snare. Resected and retrieved. Redundant                            colon. Melanosis coli.                           - The examination was otherwise normal on direct                            and retroflexion views. Moderate Sedation:      Moderate (conscious) sedation was personally administered by an       anesthesia professional. The following parameters were monitored: oxygen       saturation, heart rate, blood pressure, respiratory rate, EKG, adequacy       of pulmonary ventilation, and response to care. Recommendation:           - Patient has a contact number available  for                            emergencies. The signs and symptoms of potential                            delayed complications were discussed with the                            patient. Return to normal activities tomorrow.                            Written discharge instructions were provided to the                            patient.                           - Resume previous diet.                           - Continue present medications.                           - Repeat colonoscopy for surveillance based on                            pathology results.                           - Return to GI office in 4 months. Resume Linzess                            290 daily. See gynecologist regarding further                            evaluation of potential rectocele. Procedure Code(s):        --- Professional ---  45385, Colonoscopy, flexible; with removal of                            tumor(s), polyp(s), or other lesion(s) by snare                            technique Diagnosis Code(s):        --- Professional ---                           Z86.010, Personal history of colonic polyps                           K63.5, Polyp of colon CPT copyright 2019 [employer]. All rights reserved. The codes documented in this report are preliminary and upon coder review may  be revised to meet current compliance requirements. [name]. [name], MD Norvel Richards, MD 07/24/2021 2:18:26 PM This report has been signed electronically. Number of Addenda: 0

## 2021-07-28 ENCOUNTER — Encounter: Payer: Self-pay | Admitting: Internal Medicine

## 2021-07-28 LAB — SURGICAL PATHOLOGY

## 2021-07-30 ENCOUNTER — Encounter (HOSPITAL_COMMUNITY): Payer: Self-pay | Admitting: Internal Medicine

## 2021-12-23 ENCOUNTER — Encounter: Payer: Self-pay | Admitting: Internal Medicine

## 2021-12-23 ENCOUNTER — Ambulatory Visit (INDEPENDENT_AMBULATORY_CARE_PROVIDER_SITE_OTHER): Payer: Medicare Other | Admitting: Internal Medicine

## 2021-12-23 VITALS — BP 136/84 | HR 74 | Temp 98.6°F | Ht 60.0 in | Wt 144.0 lb

## 2021-12-23 DIAGNOSIS — J45901 Unspecified asthma with (acute) exacerbation: Secondary | ICD-10-CM | POA: Diagnosis not present

## 2021-12-23 DIAGNOSIS — J479 Bronchiectasis, uncomplicated: Secondary | ICD-10-CM | POA: Diagnosis not present

## 2021-12-23 MED ORDER — PANTOPRAZOLE SODIUM 40 MG PO TBEC: 40.0000 mg | DELAYED_RELEASE_TABLET | Freq: Every day | ORAL | 2 refills | Status: DC

## 2021-12-23 MED ORDER — METHYLPREDNISOLONE ACETATE 80 MG/ML IJ SUSP
80.0000 mg | Freq: Once | INTRAMUSCULAR | Status: AC
  Administered 2021-12-23: 80 mg via INTRAMUSCULAR

## 2021-12-23 MED ORDER — FAMOTIDINE 20 MG PO TABS: ORAL_TABLET | ORAL | 11 refills | Status: DC

## 2021-12-23 NOTE — Progress Notes (Unsigned)
LASHAWNTA Fuentes, female    DOB: 12-22-1945    MRN: [id number]   Brief patient profile:  60   yowf  never smoker/ asthma as child and daily inhalers ever since  self-referred to pulmonary clinic in [facility]  12/23/2021.  S/p ? NF 3 y prior     History of Present Illness  12/23/2021  Pulmonary/ 1st office eval/ Heidi Fuentes / [place] [facility]  Chief Complaint  Patient presents with   Acute Visit    Worsening in asthma, Dr. Elsworth Soho pt   Dyspnea:  worse x 4 months on breztri / assoc dysphagia  Cough: none /  Sleep: ok in lift chair 45 degrees  SABA use: 2 months prior to OV  was not needing any saba and since then at least once a day  02: oxygen  Covid :  3 or 4 vax   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 For pain     albuterol (VENTOLIN HFA) 108 (90 Base) MCG/ACT inhaler Inhale 2 puffs into the lungs every 6 (six) hours as needed for wheezing or shortness of breath. For shortness of breath     Budeson-Glycopyrrol-Formoterol (BREZTRI AEROSPHERE) 160-9-4.8 MCG/ACT AERO Inhale 2 puffs into the lungs in the morning and at bedtime. 10.7 g 3   buPROPion (WELLBUTRIN) 75 MG tablet Take 1 tablet (75 mg total) by mouth 3 (three) times daily.     chlorhexidine (PERIDEX) 0.12 %  solution Use as directed 15 mLs in the mouth or throat 2 (two) times daily.     cholecalciferol (VITAMIN D3) 25 MCG (1000 UNIT) tablet Take 1,000 Units by mouth daily.     FLUoxetine (PROZAC) 40 MG capsule Take 40 mg by mouth daily.     fluticasone (FLONASE) 50 MCG/ACT nasal spray Place 2 sprays into both nostrils daily.     gabapentin (NEURONTIN) 300 MG capsule Take 300 mg by mouth at bedtime.     hydrochlorothiazide (HYDRODIURIL) 12.5 MG tablet Take 12.5 mg by mouth daily as needed (swelling).     ipratropium-albuterol (DUONEB) 0.5-2.5 (3) MG/3ML SOLN Inhale 3 mLs into the lungs every 6 (six) hours as needed (shortness of breath).  0   ketoconazole (NIZORAL) 2 % cream Apply 1 Application topically daily as needed for irritation.     lamoTRIgine (LAMICTAL) 200 MG tablet Take 200 mg by mouth 2 (two) times daily.     latanoprost (XALATAN) 0.005 % ophthalmic solution Place 1 drop into both eyes at bedtime.     levothyroxine (SYNTHROID, LEVOTHROID) 50 MCG tablet Take 50 mcg  by mouth daily.     losartan (COZAAR) 25 MG tablet Take 1 tablet (25 mg total) by mouth daily.     melatonin 5 MG TABS Take 5 mg by mouth at bedtime.     montelukast (SINGULAIR) 10 MG tablet Take 10 mg by mouth daily.     Multiple Vitamins-Minerals (PRESERVISION/LUTEIN PO) Take 1 tablet by mouth 2 (two) times daily.      Oxycodone HCl 10 MG TABS Take 1 tablet (10 mg total) by mouth every 6 (six) hours as needed (for pain). 12 tablet 0   polyethylene glycol-electrolytes (TRILYTE) 420 g solution Take 8,000 mLs by mouth as directed. 4000 mL 0   potassium chloride (KLOR-CON) 10 MEQ tablet Take 10 mEq by mouth daily as needed (when taking hctz).     potassium chloride SA (KLOR-CON) 20 MEQ tablet Take 1 tablet (20 mEq total) by mouth daily. 30 tablet 5   propranolol (INDERAL) 10 MG tablet Take 1 tablet (10 mg total) by mouth 2 (two) times daily.     No facility-administered medications prior to visit.     Objective:     BP  136/84   Pulse 74   Temp 98.6 F (37 C)   Ht 5' (1.524 m)   Wt 144 lb (65.3 kg)   SpO2 97% Comment: ra  BMI 28.12 kg/m   SpO2: 97 % (ra)   Amb wf easily confused with details of care, how to use hfa   HEENT : Oropharynx  clear/ edentulous/ mucosa dry       NECK :  without  apparent JVD/ palpable Nodes/TM    LUNGS: no acc muscle use,  Nl contour chest which is clear to A and P bilaterally without cough on insp or exp maneuvers   CV:  RRR  no s3 or murmur or increase in P2, and no edema   ABD:  soft and nontender with nl inspiratory excursion in the supine position. No bruits or organomegaly appreciated   MS:  Nl gait/ ext warm without deformities Or obvious joint restrictions  calf tenderness, cyanosis or clubbing    SKIN: warm and dry without lesions    NEURO:  alert, approp, nl sensorium with  no motor or cerebellar deficits apparent.       Assessment   No problem-specific Assessment & Plan notes found for this encounter.     Heidi Gully, MD 12/23/2021

## 2021-12-23 NOTE — Patient Instructions (Addendum)
Depomedrol 21m IM   Plan A = Automatic = Always=    Breztri for now Take 2 puffs first thing in am and then another 2 puffs about 12 hours later.     Work on inhaler technique:  relax and gently blow all the way out then take a nice smooth full deep breath back in, triggering the inhaler at same time you start breathing in.  Hold breath in for at least  5 seconds if you can. Blow out breztri  thru nose. Rinse and gargle with water when done.  If mouth or throat bother you at all,  try brushing teeth/gums/tongue with arm and hammer toothpaste/ make a slurry and gargle and spit out.       Plan B = Backup (to supplement plan A, not to replace it) Only use your albuterol inhaler as a rescue medication to be used if you can't catch your breath by resting or doing a relaxed purse lip breathing pattern.  - The less you use it, the better it will work when you need it. - Ok to use the inhaler up to 2 puffs  every 4 hours if you must but call for appointment if use goes up over your usual need - Don't leave home without it !!  (think of it like the spare tire for your car)   Pantoprazole (protonix) 40 mg   Take  30-60 min before first meal of the day and Pepcid (famotidine)  20 mg after supper until return to office - this is the best way to tell whether stomach acid is contributing to your problem and stop the tums   GERD (REFLUX)  is an extremely common cause of respiratory symptoms just like yours , many times with no obvious heartburn at all.    It can be treated with medication, but also with lifestyle changes including elevation of the head of your bed (ideally with 6 -8inch blocks under the headboard of your bed),  Smoking cessation, avoidance of late meals, excessive alcohol, and avoid fatty foods, chocolate, peppermint, colas, red wine, and acidic juices such as orange juice.  NO MINT OR MENTHOL PRODUCTS SO NO COUGH DROPS  USE SUGARLESS CANDY INSTEAD (Jolley ranchers or Stover's or Life Savers)  or even ice chips will also do - the key is to swallow to prevent all throat clearing. NO OIL BASED VITAMINS - use powdered substitutes.  Avoid fish oil when coughing.    Alva in 6 weeks bring all your meds

## 2021-12-24 ENCOUNTER — Encounter: Payer: Self-pay | Admitting: Internal Medicine

## 2021-12-24 NOTE — Assessment & Plan Note (Addendum)
Onset in childhood - 12/23/2021  After extensive coaching inhaler device,  effectiveness =    50% from a baseline near 0   She's convinced her symptoms are typical of what she's experienced in past but has no wheezing today and mostly just upper airway symptoms/ mild dysphagia c/w GERD despite surgery  almost 5 years prior to OV    Rec Max rx for gerd Depomerol 80 mg IM today but no more prednisone for now  Spent extra time going over how/when to use what inhaler and surprised so struggling so much devices she says she's used for years so set up for f/u with Dr Elsworth Soho to sort out what she needs long term         Each maintenance medication was reviewed in detail including emphasizing most importantly the difference between maintenance and prns and under what circumstances the prns are to be triggered using an action plan format where appropriate.  Total time for H and P, chart review, counseling, reviewing hfa device(s) and generating customized AVS unique to this office visit / same day charting > 30 min acute visit with pt new to me

## 2022-02-04 ENCOUNTER — Encounter: Payer: Self-pay | Admitting: Internal Medicine

## 2022-02-04 ENCOUNTER — Ambulatory Visit: Payer: Medicare Other | Admitting: Internal Medicine

## 2022-02-04 ENCOUNTER — Ambulatory Visit (INDEPENDENT_AMBULATORY_CARE_PROVIDER_SITE_OTHER): Payer: Medicare Other | Admitting: Internal Medicine

## 2022-02-04 VITALS — BP 136/88 | HR 98 | Temp 98.2°F | Ht 60.0 in | Wt 145.8 lb

## 2022-02-04 DIAGNOSIS — J45901 Unspecified asthma with (acute) exacerbation: Secondary | ICD-10-CM

## 2022-02-04 MED ORDER — ALBUTEROL SULFATE (2.5 MG/3ML) 0.083% IN NEBU: 2.5000 mg | INHALATION_SOLUTION | Freq: Four times a day (QID) | RESPIRATORY_TRACT | 12 refills | Status: DC | PRN

## 2022-02-04 NOTE — Patient Instructions (Signed)
Plan A = Automatic = Always=    Breztri  Take 2 puffs first thing in am and then another 2 puffs about 12 hours later.    Work on inhaler technique:  relax and gently blow all the way out then take a nice smooth full deep breath back in, triggering the inhaler at same time you start breathing in.  Hold breath in for at least  5 seconds if you can. Blow out breztri  thru nose. Rinse and gargle with water when done.  If mouth or throat bother you at all,  try brushing teeth/gums/tongue with arm and hammer toothpaste/ make a slurry and gargle and spit out.    Plan B = Backup (to supplement plan A, not to replace it) Only use your albuterol inhaler as a rescue medication to be used if you can't catch your breath by resting or doing a relaxed purse lip breathing pattern.  - The less you use it, the better it will work when you need it. - Ok to use the inhaler up to 2 puffs  every 4 hours if you must but call for appointment if use goes up over your usual need - Don't leave home without it !!  (think of it like the spare tire for your car)   Plan C = Crisis (instead of Plan B but only if Plan B stops working) - only use your albuterol nebulizer if you first try Plan B and it fails to help > ok to use the nebulizer up to every 4 hours but if start needing it regularly call for immediate appointment  Also  Ok to try albuterol 15 min before an activity (on alternating days)  that you know would usually make you short of breath and see if it makes any difference and if makes none then don't take albuterol after activity unless you can't catch your breath as this means it's the resting that helps, not the albuterol.      Follow up with Dr Elsworth Soho next available - I will see you here as needed

## 2022-02-04 NOTE — Progress Notes (Deleted)
Heidi Fuentes, female    DOB: 1945/12/29    MRN: [id number]   Brief patient profile:  39   yowf  never smoker/ asthma as child and daily inhalers ever since  self-referred to pulmonary clinic in [facility]  12/23/2021.  S/p  diaphragmatic hernia repair  around 02/2017     History of Present Illness  12/23/2021  Pulmonary/ 1st office eval/ Yennifer Segovia / [place] [facility]  Chief Complaint  Patient presents with   Acute Visit    Worsening in asthma, Dr. Elsworth Soho pt   Dyspnea:  worse x 4 months on breztri / assoc upper  dysphagia / globus sensation  Cough: none   Sleep: ok in lift chair 45 degrees  SABA use: 2 months prior to OV  was not needing any saba and since then at least once a day  02: oxygen   Rec Depomedrol 79m IM  Plan A = Automatic = Always=    Breztri for now Take 2 puffs first thing in am and then another 2 puffs about 12 hours later.  Work on inhaler technique:  Plan B = Backup (to supplement plan A, not to replace it) Only use your albuterol inhaler as a rescue medication Pantoprazole (protonix) 40 mg   Take  30-60 min before first meal of the day and Pepcid (famotidine)  20 mg after supper until return to office GERD rx   Alva in 6 weeks bring all your meds     02/04/2022  f/u ov/[place] office/Audriana Aldama re: *** maint on ***  No chief complaint on file.   Dyspnea:  *** Cough: *** Sleeping: *** SABA use: *** 02: *** Covid status: *** Lung cancer screening: ***   No obvious day to day or daytime variability or assoc excess/ purulent sputum or mucus plugs or hemoptysis or cp or chest tightness, subjective wheeze or overt sinus or hb symptoms.   *** without nocturnal  or early am exacerbation  of respiratory  c/o's or need for noct saba. Also denies any obvious fluctuation of symptoms with weather or environmental changes or other aggravating or alleviating factors except as outlined above   No unusual exposure hx or h/o childhood pna/ asthma or knowledge of premature  birth.  Current Allergies, Complete Past Medical History, Past Surgical History, Family History, and Social History were reviewed in [employer].  ROS  The following are not active complaints unless bolded Hoarseness, sore throat, dysphagia, dental problems, itching, sneezing,  nasal congestion or discharge of excess mucus or purulent secretions, ear ache,   fever, chills, sweats, unintended wt loss or wt gain, classically pleuritic or exertional cp,  orthopnea pnd or arm/hand swelling  or leg swelling, presyncope, palpitations, abdominal pain, anorexia, nausea, vomiting, diarrhea  or change in bowel habits or change in bladder habits, change in stools or change in urine, dysuria, hematuria,  rash, arthralgias, visual complaints, headache, numbness, weakness or ataxia or problems with walking or coordination,  change in mood or  memory.        No outpatient medications have been marked as taking for the 02/04/22 encounter (Appointment) with WTanda Rockers MD.        Past Medical History:  Diagnosis Date   Arthritis    Asthma    Back pain    Chronic pain syndrome 07/10/2013   Depression    Duodenal papillary stenosis    Dyspnea    Esophageal dysmotility    Fibromyalgia    GERD (gastroesophageal reflux  disease)    Glaucoma    H/O wheezing    History of hiatal hernia    History of palpitations    HOH (hard of hearing)    HOH (hard of hearing)    Hypertension    Hypothyroidism    Macular degeneration    Orthopnea    Stenosis, spinal, lumbar    Thyroid disease    Tubular adenoma    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([facility])         Objective:       Wt Readings from Last 3 Encounters:  12/23/21 144 lb (65.3 kg)  07/22/21 145 lb 1 oz (65.8 kg)  05/12/21 145 lb (65.8 kg)      Vital signs reviewed  02/04/2022  - Note at rest 02 sats  ***% on ***   General appearance:    ***   edentulous               Assessment

## 2022-02-04 NOTE — Assessment & Plan Note (Signed)
Onset in childhood - 12/23/2021  After extensive coaching inhaler device,  effectiveness =    50% from a baseline near 0  - 02/04/2022  After extensive coaching inhaler device,  effectiveness =  60% > continue breztri and approp saba   All goals of chronic asthma control met including optimal function and elimination of symptoms with minimal need for rescue therapy.  Contingencies discussed in full including contacting this office immediately if not controlling the symptoms using the rule of two's  Re saba: Re SABA :  I spent extra time with pt today reviewing appropriate use of albuterol for prn use on exertion with the following points: 1) saba is for relief of sob that does not improve by walking a slower pace or resting but rather if the pt does not improve after trying this first. 2) If the pt is convinced, as many are, that saba helps recover from activity faster then it's easy to tell if this is the case by re-challenging : ie stop, take the inhaler, then p 5 minutes try the exact same activity (intensity of workload) that just caused the symptoms and see if they are substantially diminished or not after saba 3) if there is an activity that reproducibly causes the symptoms, try the saba 15 min before the activity on alternate days   If in fact the saba really does help, then fine to continue to use it prn but advised may need to look closer at the maintenance regimen (jere breztri, making sure she's using it effectively/consistently) being used to achieve better control of airways disease with exertion.  F/u with Dr Elsworth Soho as previously rec, here prn          Each maintenance medication was reviewed in detail including emphasizing most importantly the difference between maintenance and prns and under what circumstances the prns are to be triggered using an action plan format where appropriate.  Total time for H and P, chart review, counseling, reviewing hfa/neb device(s) and generating  customized AVS unique to this office visit / same day charting = 25 min

## 2022-02-04 NOTE — Progress Notes (Signed)
Heidi Fuentes, female    DOB: 05/24/1945    MRN: [id number]   Brief patient profile:  34   yowf  never smoker/ asthma as child and daily inhalers ever since  self-referred to pulmonary clinic in [facility]  12/23/2021.  S/p  diaphragmatic hernia repair  around 02/2017     History of Present Illness  12/23/2021  Pulmonary/ 1st office eval/ [place] Sunday / [place] [facility]  Chief Complaint  Patient presents with   Acute Visit    Worsening in asthma, Dr. Elsworth Soho pt   Dyspnea:  worse x 4 months on breztri / assoc upper  dysphagia / globus sensation  Cough: none   Sleep: ok in lift chair 45 degrees  SABA use: 2 months prior to OV  was not needing any saba and since then at least once a day  02: oxygen   Rec Depomedrol 70m IM  Plan A = Automatic = Always=    Breztri for now Take 2 puffs first thing in am and then another 2 puffs about 12 hours later.  Work on inhaler technique:  Plan B = Backup (to supplement plan A, not to replace it) Only use your albuterol inhaler as a rescue medication Pantoprazole (protonix) 40 mg   Take  30-60 min before first meal of the day and Pepcid (famotidine)  20 mg after supper until return to office GERD rx   Alva in 6 weeks bring all your meds   02/04/2022  f/u ov/[place] office/Jamal Haskin re: asthma  maint on Breztri   Chief Complaint  Patient presents with   Follow-up  Dyspnea:  feels that this has improved on breztri / tends to fall/ walks with crutch so not all that active Cough: less/ swallowing better on gerd rx Sleeping: doing fine 30 degrees  SABA use: rarely use albuterol  02: none  Covid status: needs the new one    No obvious day to day or daytime variability or assoc excess/ purulent sputum or mucus plugs or hemoptysis or cp or chest tightness, subjective wheeze or overt  hb symptoms.   Sleeping  without nocturnal  or early am exacerbation  of respiratory  c/o's or need for noct saba. Also denies any obvious fluctuation of symptoms with weather or  environmental changes or other aggravating or alleviating factors except as outlined above   No unusual exposure hx or h/o childhood pna/ asthma or knowledge of premature birth.  Current Allergies, Complete Past Medical History, Past Surgical History, Family History, and Social History were reviewed in [employer].  ROS  The following are not active complaints unless bolded Hoarseness, sore throat, dysphagia, dental problems, itching, sneezing,  nasal congestion or discharge of excess mucus or purulent secretions, ear ache,   fever, chills, sweats, unintended wt loss or wt gain, classically pleuritic or exertional cp,  orthopnea pnd or arm/hand swelling  or leg swelling, presyncope, palpitations, abdominal pain, anorexia, nausea, vomiting, diarrhea  or change in bowel habits or change in bladder habits, change in stools or change in urine, dysuria, hematuria,  rash, arthralgias, visual complaints, headache, numbness, weakness or ataxia or problems with walking or coordination,  change in mood or  memory.        Current Meds  Medication Sig   acetaminophen (TYLENOL) 500 MG tablet Take 500 mg by mouth every 6 (six) hours as needed for mild pain or moderate pain. For pain   albuterol (VENTOLIN HFA) 108 (90 Base) MCG/ACT inhaler Inhale 2 puffs  into the lungs every 6 (six) hours as needed for wheezing or shortness of breath. For shortness of breath   Budeson-Glycopyrrol-Formoterol (BREZTRI AEROSPHERE) 160-9-4.8 MCG/ACT AERO Inhale 2 puffs into the lungs in the morning and at bedtime.   buPROPion (WELLBUTRIN) 75 MG tablet Take 1 tablet (75 mg total) by mouth 3 (three) times daily.   chlorhexidine (PERIDEX) 0.12 % solution Use as directed 15 mLs in the mouth or throat 2 (two) times daily.   cholecalciferol (VITAMIN D3) 25 MCG (1000 UNIT) tablet Take 1,000 Units by mouth daily.   famotidine (PEPCID) 20 MG tablet One after supper   FLUoxetine (PROZAC) 40 MG capsule Take 40 mg  by mouth daily.   fluticasone (FLONASE) 50 MCG/ACT nasal spray Place 2 sprays into both nostrils daily.   gabapentin (NEURONTIN) 300 MG capsule Take 300 mg by mouth at bedtime.   hydrochlorothiazide (HYDRODIURIL) 12.5 MG tablet Take 12.5 mg by mouth daily as needed (swelling).   ipratropium-albuterol (DUONEB) 0.5-2.5 (3) MG/3ML SOLN Inhale 3 mLs into the lungs every 6 (six) hours as needed (shortness of breath).   ketoconazole (NIZORAL) 2 % cream Apply 1 Application topically daily as needed for irritation.   lamoTRIgine (LAMICTAL) 200 MG tablet Take 200 mg by mouth 2 (two) times daily.   latanoprost (XALATAN) 0.005 % ophthalmic solution Place 1 drop into both eyes at bedtime.   levothyroxine (SYNTHROID, LEVOTHROID) 50 MCG tablet Take 50 mcg by mouth daily.   losartan (COZAAR) 25 MG tablet Take 1 tablet (25 mg total) by mouth daily.   melatonin 5 MG TABS Take 5 mg by mouth at bedtime.   montelukast (SINGULAIR) 10 MG tablet Take 10 mg by mouth daily.   Multiple Vitamins-Minerals (PRESERVISION/LUTEIN PO) Take 1 tablet by mouth 2 (two) times daily.    Oxycodone HCl 10 MG TABS Take 1 tablet (10 mg total) by mouth every 6 (six) hours as needed (for pain).   pantoprazole (PROTONIX) 40 MG tablet Take 1 tablet (40 mg total) by mouth daily. Take 30-60 min before first meal of the day   polyethylene glycol-electrolytes (TRILYTE) 420 g solution Take 8,000 mLs by mouth as directed.   potassium chloride (KLOR-CON) 10 MEQ tablet Take 10 mEq by mouth daily as needed (when taking hctz).   potassium chloride SA (KLOR-CON) 20 MEQ tablet Take 1 tablet (20 mEq total) by mouth daily.   propranolol (INDERAL) 10 MG tablet Take 1 tablet (10 mg total) by mouth 2 (two) times daily.        Past Medical History:  Diagnosis Date   Arthritis    Asthma    Back pain    Chronic pain syndrome 07/10/2013   Depression    Duodenal papillary stenosis    Dyspnea    Esophageal dysmotility    Fibromyalgia    GERD  (gastroesophageal reflux disease)    Glaucoma    H/O wheezing    History of hiatal hernia    History of palpitations    HOH (hard of hearing)    HOH (hard of hearing)    Hypertension    Hypothyroidism    Macular degeneration    Orthopnea    Stenosis, spinal, lumbar    Thyroid disease    Tubular adenoma    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([facility])         Objective:       Wt Readings from Last 3 Encounters:  02/04/22 145 lb 12.8 oz (66.1 kg)  12/23/21 144 lb (65.3 kg)  07/22/21 145 lb 1 oz (65.8 kg)      Vital signs reviewed  02/04/2022  - Note at rest 02 sats  96% on RA   General appearance:    amb pleasant wf nad   HEENT : Oropharynx  clear/ edentulous      NECK :  without  apparent JVD/ palpable Nodes/TM    LUNGS: no acc muscle use,  Nl contour chest which is clear to A and P bilaterally without cough on insp or exp maneuvers   CV:  RRR  no s3 or murmur or increase in P2, and no edema   ABD:  soft and nontender    MS:  Nl gait/ ext warm without deformities Or obvious joint restrictions  calf tenderness, cyanosis or clubbing    SKIN: warm and dry without lesions    NEURO:  alert, approp, nl sensorium with  no motor or cerebellar deficits apparent.    edentulous               Assessment

## 2022-04-02 ENCOUNTER — Encounter: Payer: Self-pay | Admitting: Radiology

## 2022-04-07 ENCOUNTER — Ambulatory Visit: Payer: Medicare Other | Admitting: Pulmonary Disease

## 2022-06-03 ENCOUNTER — Ambulatory Visit (INDEPENDENT_AMBULATORY_CARE_PROVIDER_SITE_OTHER): Payer: Medicare Other | Admitting: Pulmonary Disease

## 2022-06-03 ENCOUNTER — Encounter: Payer: Self-pay | Admitting: Pulmonary Disease

## 2022-06-03 VITALS — BP 140/74 | HR 78 | Ht 60.0 in | Wt 141.0 lb

## 2022-06-03 DIAGNOSIS — J45901 Unspecified asthma with (acute) exacerbation: Secondary | ICD-10-CM

## 2022-06-03 DIAGNOSIS — J479 Bronchiectasis, uncomplicated: Secondary | ICD-10-CM | POA: Diagnosis not present

## 2022-06-03 MED ORDER — BECLOMETHASONE DIPROP HFA 80 MCG/ACT IN AERB: 1.0000 | INHALATION_SPRAY | Freq: Every day | RESPIRATORY_TRACT | 1 refills | Status: DC

## 2022-06-03 NOTE — Patient Instructions (Addendum)
OK to decrease Breztri to 1 puff twice daily  X Rx for Qvar 80 mcg 1 puff daily in addition   Use albuterol only as needed X spiro pre/post next visit

## 2022-06-03 NOTE — Progress Notes (Signed)
   Subjective:    Patient ID: Heidi Fuentes, female    DOB: October 13, 1945, 77 y.o.   MRN: [id number]  HPI  77 yo never smoker for follow-up of asthma/fixed airway obstruction, mild bronchiectasis and persistent shortness of breath  02/2018 >> severe MRSA and Pseudomonas pneumonia.    She reports lifelong history of asthma starting as a child, grew out of this as an adult but this is recurrent over the past few years.  She was on Advair for several years and this was stopped since symptoms were felt to be intermittent after pulmonology follow-up at [facility] in 05/2018.     PMH - chronic pain on oxycodone S/p diaphragmatic hernia repair around 02/2017  01/2020 ED visit -pedal edema, normal BNP, improved with   Meds-Breztri worked well but very expensive.  Symbicort not covered either   Chief Complaint  Patient presents with   Follow-up    Breathing is overall doing well. She is using her albuterol twice daily on average. She has cut back on Breztri to 1 puff bid due to tremor.     I am seeing Heidi Fuentes after 2 years.  She is somewhat partner Dr. Sherene Sires in the interim, she reports increased tremors with Breztri. She had been started on propranolol for hypertension and she stopped taking this for about a month. She self decreased Breztri to 1 puff twice daily, she is still having some Remmers.  She is concerned about her asthma flaring up. She reports worsening dyspnea especially during pollen season.  She is on Singulair and Flonase in addition. She also has questions about whether she can safely go through knee surgery  Significant tests/ events reviewed   AEC 200   PFTs 10/2019 moderate airway obstruction with ratio 55, FEV1 0.89/50%, improved to 1.07/60% with bronchodilator, FVC 67%/1.78   07/27/19 OV >> On ambulation, saturation stayed between 100 to 97% on walking 3 laps at her own pace   CT chest WO contrast 07/2019 >> Progressive postinflammatory or postinfectious scarring and  subsegmental atelectasis in both lower lobes ,mild bronchiectasis   CT chest 07/2018 shows resolution of consolidation with small 4 to 5 mm bilateral lung nodules. 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 Also denies presyncope, palpitations, heartburn, abdominal pain, nausea, vomiting, diarrhea or change in bowel or urinary habits, dysuria,hematuria, rash, arthralgias, visual complaints, headache, numbness weakness or ataxia.     Objective:   Physical Exam  Gen. Pleasant, well-nourished, in no distress ENT - no thrush, no pallor/icterus,no post nasal drip Neck: No JVD, no thyromegaly, no carotid bruits Lungs: no use of accessory muscles, no dullness to percussion, clear without rales or rhonchi  Cardiovascular: Rhythm regular, heart sounds  normal, no murmurs or gallops, no peripheral edema Musculoskeletal: No deformities, no cyanosis or clubbing         Assessment & Plan:

## 2022-06-03 NOTE — Assessment & Plan Note (Signed)
Likely related to previous episode of severe pneumonia rather than primary bronchiectasis. Airway clearance does not seem to be an issue here

## 2022-06-03 NOTE — Assessment & Plan Note (Addendum)
I have asked her to continue Breztri at 1 puff twice daily.  Will avoid full dose due to increased tremors and fall risk.  We will in addition add steroid inhaler such as Qvar 80 mg once daily and see if we can achieve better control and minimize use of bronchodilators. We will reassess her in 1 month to see how this regimen is working Assessment spirometry pre and post next visit

## 2022-07-01 ENCOUNTER — Ambulatory Visit: Payer: Medicare Other | Admitting: Pulmonary Disease

## 2022-07-15 ENCOUNTER — Inpatient Hospital Stay (HOSPITAL_COMMUNITY): Payer: Medicare Other

## 2022-07-15 ENCOUNTER — Inpatient Hospital Stay (HOSPITAL_COMMUNITY)
Admission: EM | Admit: 2022-07-15 | Discharge: 2022-07-28 | DRG: 871 | Disposition: A | Discharge status: Skilled Nursing Facility | Payer: Medicare Other | Attending: Internal Medicine | Admitting: Internal Medicine

## 2022-07-15 ENCOUNTER — Emergency Department (HOSPITAL_COMMUNITY): Payer: Medicare Other

## 2022-07-15 ENCOUNTER — Encounter (HOSPITAL_COMMUNITY): Payer: Self-pay | Admitting: *Deleted

## 2022-07-15 ENCOUNTER — Other Ambulatory Visit: Payer: Self-pay

## 2022-07-15 DIAGNOSIS — B3731 Acute candidiasis of vulva and vagina: Secondary | ICD-10-CM | POA: Diagnosis present

## 2022-07-15 DIAGNOSIS — I10 Essential (primary) hypertension: Secondary | ICD-10-CM | POA: Diagnosis not present

## 2022-07-15 DIAGNOSIS — F411 Generalized anxiety disorder: Secondary | ICD-10-CM | POA: Diagnosis present

## 2022-07-15 DIAGNOSIS — M797 Fibromyalgia: Secondary | ICD-10-CM | POA: Diagnosis present

## 2022-07-15 DIAGNOSIS — G9341 Metabolic encephalopathy: Secondary | ICD-10-CM | POA: Diagnosis present

## 2022-07-15 DIAGNOSIS — Z5320 Procedure and treatment not carried out because of patient's decision for unspecified reasons: Secondary | ICD-10-CM | POA: Diagnosis not present

## 2022-07-15 DIAGNOSIS — E039 Hypothyroidism, unspecified: Secondary | ICD-10-CM | POA: Diagnosis present

## 2022-07-15 DIAGNOSIS — Z9842 Cataract extraction status, left eye: Secondary | ICD-10-CM

## 2022-07-15 DIAGNOSIS — N3000 Acute cystitis without hematuria: Secondary | ICD-10-CM | POA: Diagnosis not present

## 2022-07-15 DIAGNOSIS — Z882 Allergy status to sulfonamides status: Secondary | ICD-10-CM

## 2022-07-15 DIAGNOSIS — Z8249 Family history of ischemic heart disease and other diseases of the circulatory system: Secondary | ICD-10-CM | POA: Diagnosis not present

## 2022-07-15 DIAGNOSIS — H409 Unspecified glaucoma: Secondary | ICD-10-CM | POA: Diagnosis present

## 2022-07-15 DIAGNOSIS — E876 Hypokalemia: Secondary | ICD-10-CM | POA: Diagnosis present

## 2022-07-15 DIAGNOSIS — Z8719 Personal history of other diseases of the digestive system: Secondary | ICD-10-CM

## 2022-07-15 DIAGNOSIS — A419 Sepsis, unspecified organism: Secondary | ICD-10-CM | POA: Diagnosis not present

## 2022-07-15 DIAGNOSIS — R410 Disorientation, unspecified: Principal | ICD-10-CM | POA: Diagnosis present

## 2022-07-15 DIAGNOSIS — Z961 Presence of intraocular lens: Secondary | ICD-10-CM | POA: Diagnosis present

## 2022-07-15 DIAGNOSIS — Z825 Family history of asthma and other chronic lower respiratory diseases: Secondary | ICD-10-CM | POA: Diagnosis not present

## 2022-07-15 DIAGNOSIS — Z79890 Hormone replacement therapy (postmenopausal): Secondary | ICD-10-CM

## 2022-07-15 DIAGNOSIS — H353290 Exudative age-related macular degeneration, unspecified eye, stage unspecified: Secondary | ICD-10-CM | POA: Diagnosis present

## 2022-07-15 DIAGNOSIS — Z7951 Long term (current) use of inhaled steroids: Secondary | ICD-10-CM

## 2022-07-15 DIAGNOSIS — F259 Schizoaffective disorder, unspecified: Secondary | ICD-10-CM | POA: Diagnosis present

## 2022-07-15 DIAGNOSIS — G894 Chronic pain syndrome: Secondary | ICD-10-CM | POA: Diagnosis present

## 2022-07-15 DIAGNOSIS — Z9841 Cataract extraction status, right eye: Secondary | ICD-10-CM

## 2022-07-15 DIAGNOSIS — E860 Dehydration: Secondary | ICD-10-CM | POA: Diagnosis present

## 2022-07-15 DIAGNOSIS — M48061 Spinal stenosis, lumbar region without neurogenic claudication: Secondary | ICD-10-CM | POA: Diagnosis present

## 2022-07-15 DIAGNOSIS — K219 Gastro-esophageal reflux disease without esophagitis: Secondary | ICD-10-CM | POA: Diagnosis present

## 2022-07-15 DIAGNOSIS — Z79899 Other long term (current) drug therapy: Secondary | ICD-10-CM

## 2022-07-15 DIAGNOSIS — J45909 Unspecified asthma, uncomplicated: Secondary | ICD-10-CM | POA: Diagnosis present

## 2022-07-15 DIAGNOSIS — F3160 Bipolar disorder, current episode mixed, unspecified: Secondary | ICD-10-CM | POA: Diagnosis not present

## 2022-07-15 DIAGNOSIS — R451 Restlessness and agitation: Secondary | ICD-10-CM | POA: Diagnosis not present

## 2022-07-15 DIAGNOSIS — F319 Bipolar disorder, unspecified: Secondary | ICD-10-CM | POA: Diagnosis not present

## 2022-07-15 DIAGNOSIS — Z888 Allergy status to other drugs, medicaments and biological substances status: Secondary | ICD-10-CM

## 2022-07-15 DIAGNOSIS — Z79891 Long term (current) use of opiate analgesic: Secondary | ICD-10-CM

## 2022-07-15 DIAGNOSIS — F330 Major depressive disorder, recurrent, mild: Secondary | ICD-10-CM | POA: Diagnosis present

## 2022-07-15 DIAGNOSIS — R6520 Severe sepsis without septic shock: Secondary | ICD-10-CM | POA: Diagnosis present

## 2022-07-15 DIAGNOSIS — N390 Urinary tract infection, site not specified: Secondary | ICD-10-CM | POA: Diagnosis present

## 2022-07-15 LAB — CBC WITH DIFFERENTIAL/PLATELET
Abs Immature Granulocytes: 0.06 10*3/uL (ref 0.00–0.07)
Basophils Absolute: 0 10*3/uL (ref 0.0–0.1)
Basophils Relative: 0 %
Eosinophils Absolute: 0 10*3/uL (ref 0.0–0.5)
Eosinophils Relative: 0 %
HCT: 47.2 % — ABNORMAL HIGH (ref 36.0–46.0)
Hemoglobin: 15.8 g/dL — ABNORMAL HIGH (ref 12.0–15.0)
Immature Granulocytes: 0 %
Lymphocytes Relative: 10 %
Lymphs Abs: 1.7 10*3/uL (ref 0.7–4.0)
MCH: 31.7 pg (ref 26.0–34.0)
MCHC: 33.5 g/dL (ref 30.0–36.0)
MCV: 94.6 fL (ref 80.0–100.0)
Monocytes Absolute: 1.2 10*3/uL — ABNORMAL HIGH (ref 0.1–1.0)
Monocytes Relative: 7 %
Neutro Abs: 13.2 10*3/uL — ABNORMAL HIGH (ref 1.7–7.7)
Neutrophils Relative %: 83 %
Platelets: 329 10*3/uL (ref 150–400)
RBC: 4.99 MIL/uL (ref 3.87–5.11)
RDW: 13.4 % (ref 11.5–15.5)
WBC: 16.1 10*3/uL — ABNORMAL HIGH (ref 4.0–10.5)
nRBC: 0 % (ref 0.0–0.2)

## 2022-07-15 LAB — URINALYSIS, ROUTINE W REFLEX MICROSCOPIC
Bilirubin Urine: NEGATIVE
Glucose, UA: NEGATIVE mg/dL
Ketones, ur: 80 mg/dL — AB
Nitrite: NEGATIVE
Protein, ur: >=300 mg/dL — AB
Specific Gravity, Urine: 1.019 (ref 1.005–1.030)
WBC, UA: >50 WBC/hpf (ref 0–5)
pH: 5 (ref 5.0–8.0)

## 2022-07-15 LAB — RAPID URINE DRUG SCREEN, HOSP PERFORMED
Amphetamines: NOT DETECTED
Barbiturates: NOT DETECTED
Benzodiazepines: NOT DETECTED
Cocaine: NOT DETECTED
Opiates: NOT DETECTED
Tetrahydrocannabinol: NOT DETECTED

## 2022-07-15 LAB — COMPREHENSIVE METABOLIC PANEL
ALT: 37 U/L (ref 0–44)
AST: 46 U/L — ABNORMAL HIGH (ref 15–41)
Albumin: 4.6 g/dL (ref 3.5–5.0)
Alkaline Phosphatase: 66 U/L (ref 38–126)
Anion gap: 19 — ABNORMAL HIGH (ref 5–15)
BUN: 29 mg/dL — ABNORMAL HIGH (ref 8–23)
CO2: 18 mmol/L — ABNORMAL LOW (ref 22–32)
Calcium: 9.9 mg/dL (ref 8.9–10.3)
Chloride: 100 mmol/L (ref 98–111)
Creatinine, Ser: 0.81 mg/dL (ref 0.44–1.00)
GFR, Estimated: >60 mL/min (ref >60)
Glucose, Bld: 109 mg/dL — ABNORMAL HIGH (ref 70–99)
Potassium: 3.6 mmol/L (ref 3.5–5.1)
Sodium: 137 mmol/L (ref 135–145)
Total Bilirubin: 1.5 mg/dL — ABNORMAL HIGH (ref 0.3–1.2)
Total Protein: 7.9 g/dL (ref 6.5–8.1)

## 2022-07-15 LAB — ETHANOL: Alcohol, Ethyl (B): <10 mg/dL (ref <10)

## 2022-07-15 LAB — TSH: TSH: 0.581 u[IU]/mL (ref 0.350–4.500)

## 2022-07-15 LAB — LACTIC ACID, PLASMA: Lactic Acid, Venous: 1.2 mmol/L (ref 0.5–1.9)

## 2022-07-15 LAB — AMMONIA: Ammonia: <10 umol/L (ref 9–35)

## 2022-07-15 LAB — CBG MONITORING, ED: Glucose-Capillary: 107 mg/dL — ABNORMAL HIGH (ref 70–99)

## 2022-07-15 MED ORDER — SODIUM CHLORIDE 0.9 % IV BOLUS: 1000.0000 mL | Freq: Once | INTRAVENOUS | Status: DC

## 2022-07-15 MED ORDER — ACETAMINOPHEN 650 MG RE SUPP: 650.0000 mg | Freq: Four times a day (QID) | RECTAL | Status: DC | PRN

## 2022-07-15 MED ORDER — ACETAMINOPHEN 325 MG PO TABS
650.0000 mg | ORAL_TABLET | Freq: Four times a day (QID) | ORAL | Status: DC | PRN
  Administered 2022-07-22 – 2022-07-25 (×5): 650 mg via ORAL
  Filled 2022-07-15 (×5): qty 2

## 2022-07-15 MED ORDER — MOMETASONE FURO-FORMOTEROL FUM 200-5 MCG/ACT IN AERO
2.0000 | INHALATION_SPRAY | Freq: Two times a day (BID) | RESPIRATORY_TRACT | Status: DC
  Filled 2022-07-15: qty 8.8

## 2022-07-15 MED ORDER — SODIUM CHLORIDE 0.9 % IV SOLN
1.0000 g | INTRAVENOUS | Status: DC
  Administered 2022-07-15 – 2022-07-17 (×3): 1 g via INTRAVENOUS
  Filled 2022-07-15 (×3): qty 10

## 2022-07-15 MED ORDER — ENOXAPARIN SODIUM 40 MG/0.4ML IJ SOSY
40.0000 mg | PREFILLED_SYRINGE | INTRAMUSCULAR | Status: DC
  Administered 2022-07-15 – 2022-07-27 (×10): 40 mg via SUBCUTANEOUS
  Filled 2022-07-15 (×12): qty 0.4

## 2022-07-15 MED ORDER — BUDESON-GLYCOPYRROL-FORMOTEROL 160-9-4.8 MCG/ACT IN AERO: 2.0000 | INHALATION_SPRAY | Freq: Two times a day (BID) | RESPIRATORY_TRACT | Status: DC

## 2022-07-15 MED ORDER — ONDANSETRON HCL 4 MG/2ML IJ SOLN: 4.0000 mg | Freq: Three times a day (TID) | INTRAMUSCULAR | Status: DC | PRN

## 2022-07-15 MED ORDER — ALBUTEROL SULFATE (2.5 MG/3ML) 0.083% IN NEBU: 2.5000 mg | INHALATION_SOLUTION | Freq: Four times a day (QID) | RESPIRATORY_TRACT | Status: DC | PRN

## 2022-07-15 MED ORDER — LATANOPROST 0.005 % OP SOLN
1.0000 [drp] | Freq: Every day | OPHTHALMIC | Status: DC
  Administered 2022-07-15 – 2022-07-27 (×8): 1 [drp] via OPHTHALMIC
  Filled 2022-07-15: qty 2.5

## 2022-07-15 MED ORDER — POTASSIUM CHLORIDE IN NACL 20-0.9 MEQ/L-% IV SOLN: INTRAVENOUS | Status: AC

## 2022-07-15 MED ORDER — ONDANSETRON HCL 4 MG PO TABS: 4.0000 mg | ORAL_TABLET | Freq: Three times a day (TID) | ORAL | Status: DC | PRN

## 2022-07-15 MED ORDER — UMECLIDINIUM BROMIDE 62.5 MCG/ACT IN AEPB
1.0000 | INHALATION_SPRAY | Freq: Every day | RESPIRATORY_TRACT | Status: DC
  Administered 2022-07-16: 1 via RESPIRATORY_TRACT
  Filled 2022-07-15: qty 7

## 2022-07-15 MED ORDER — SODIUM CHLORIDE 0.9 % IV BOLUS
1000.0000 mL | Freq: Once | INTRAVENOUS | Status: AC
  Administered 2022-07-15: 1000 mL via INTRAVENOUS

## 2022-07-15 NOTE — Assessment & Plan Note (Signed)
Severe sepsis likely due to urinary tract infection.  Meeting severe sepsis criteria with tachycardia, heart rate 118-129, with leukocytosis of 16.1, and evidence of endorgan dysfunction- encephalopathy.  Lactic acid 1.2.  No recent urine cultures on file -Follow-up blood cultures -Add on urine cultures -1 L bolus, continue N/s 75cc/hr x 1 day -IV ceftriaxone 1 g daily

## 2022-07-15 NOTE — Progress Notes (Signed)
Came into room and told patient who I was and patient had been talking to herself.  She keeps repeating same story about her father.  I tried to persuade patient to take her inhaler, she stated she takes inhalers at home but would not take her inhaler and kept repeating story over and over.  She refused to take it and is very altered.

## 2022-07-15 NOTE — H&P (Signed)
History and Physical    Heidi Fuentes:[id number] DOB: 1945-10-10 DOA: 07/15/2022  PCP: Tanna Furry, MD   Patient coming from: Home  I have personally briefly reviewed patient's old medical records in [facility]  Chief Complaint: AMS  HPI: Heidi Fuentes is a 77 y.o. female with medical history significant for asthma, bipolar disorder, chronic pain, hypertension, bronchiectasis. Patient was brought to the ED via EMS reports of altered mental status.  Patient was last known normal on Saturday.  Her son called this morning but she was not answering normally, at baseline patient has slight confusion.  Son reports that patient was talking to the clock, talking to her hand, and did not recognize him.  At the time of my evaluation, patient is slightly somnolent, with eyes closed, but responding to some some directions, splinting appropriately to 1 question only.    ED Course: Tmax 98.6.  Respiratory rate 17-20 blood heart rate 119-129.  Blood pressure systolic 130s to 811B.  O2 sats greater than 96% on room air.  WBC 16.1.  UA with large leukocytes rare bacteria.  Head CT negative for acute abnormality. Hospitalist to admit for UTI with altered mental status.  Review of Systems: As per HPI all other systems reviewed and negative.  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0 CDE 5.86 Fluid Pack lot # [id number]   COCCYX REMOVAL     colonoscopy  2005   Dr. Karilyn Cota: mild melanosis coli, otherwise normal   COLONOSCOPY WITH PROPOFOL N/A 09/14/2013   VHQ:IONGEXBMW coli. Colonic diverticulosis. Single colonic. Tubular adenoma. Next TCS 09/2020.   COLONOSCOPY WITH PROPOFOL N/A 07/24/2021   Procedure: COLONOSCOPY WITH PROPOFOL;  Surgeon: Corbin Ade, MD;  Location: [facility];  Service: Endoscopy;  Laterality: N/A;  2:00pm   ERCP  1997   [facility]: biliary manometry abnormal, subsequent sphincterotomy    ESOPHAGEAL MANOMETRY N/A 12/21/2016   Surgeon: Napoleon Form, MD; EG junction outflow obstruction which could be secondary to large hiatal hernia and possible coiling of catheter.  Not consistent with achalasia or other variant.  No major peristaltic motility disorder.   ESOPHAGOGASTRODUODENOSCOPY (EGD) WITH PROPOFOL N/A 09/14/2013   UXL:[id number]'O ring. Hiatal hernia. Status post  Maloney and biopsy disruption.    FOOT SURGERY     X2   HEMORROIDECTOMY     HERNIA REPAIR     inguinal right   HERNIA REPAIR     LAPAROSCOPIC PARAESOPHAGEAL HERNIA REPAIR  02/2017   Dr. Carolynn Sayers [facility])   [place] DILATION N/A 09/14/2013   Procedure: Alvy Beal;  Surgeon: Corbin Ade, MD;  Location: [facility] ORS;  Service: Endoscopy;  Laterality: N/A;  56   POLYPECTOMY N/A 09/14/2013   Procedure: POLYPECTOMY;  Surgeon: Corbin Ade, MD;  Location: [facility] ORS;  Service: Endoscopy;   Laterality: N/A;   POLYPECTOMY  07/24/2021   Procedure: POLYPECTOMY;  Surgeon: Corbin Ade, MD;  Location: [facility];  Service: Endoscopy;;   TONSILLECTOMY       reports that she has never smoked. She has never used smokeless tobacco. She reports that she does not drink alcohol and does not use drugs.  Allergies  Allergen Reactions   Abilify [Aripiprazole] Other (See Comments)    Bad dreams   Geodon [Ziprasidone Hcl] Other (See Comments)    hospitalization specifics   Quetiapine Palpitations   Sulfa Antibiotics Rash   Trazodone And Nefazodone Other (See Comments)    Reaction is unknown    Family History  Problem Relation Age of Onset   Lung cancer Mother    Heart attack Father    Bone cancer Brother    COPD Sister    Colon cancer Neg Hx     Prior to Admission medications   Medication Sig Start Date End Date Taking? Authorizing Provider  acetaminophen (TYLENOL) 500 MG tablet Take 500 mg by mouth every 6 (six) hours as needed for mild pain or moderate pain. For pain    [provider]  albuterol (PROVENTIL) (2.5 MG/3ML) 0.083% nebulizer solution Take 3 mLs (2.5 mg total) by nebulization every 6 (six) hours as needed for wheezing or shortness of breath. 02/04/22   Nyoka Cowden, MD  albuterol (VENTOLIN HFA) 108 (90 Base) MCG/ACT inhaler Inhale 2 puffs into the lungs every 6 (six) hours as needed for wheezing or shortness of breath. For shortness of breath    [provider]  beclomethasone (QVAR) 80 MCG/ACT inhaler Inhale 1 puff into the lungs daily. 06/03/22   Oretha Milch, MD  Budeson-Glycopyrrol-Formoterol (BREZTRI AEROSPHERE) 160-9-4.8 MCG/ACT AERO Inhale 2 puffs into the lungs in the morning and at bedtime. Patient taking differently: Inhale 1 puff into the lungs in the morning and at bedtime. 03/18/20   Oretha Milch, MD  buPROPion (WELLBUTRIN) 75 MG tablet Take 1 tablet (75 mg total) by mouth 3 (three) times daily. 08/13/20   Danford, Earl Lites,  MD  chlorhexidine (PERIDEX) 0.12 % solution Use as directed 15 mLs in the mouth or throat 2 (two) times daily. 07/09/21   [provider]  cholecalciferol (VITAMIN D3) 25 MCG (1000 UNIT) tablet Take 1,000 Units by mouth daily.    [provider]  famotidine (PEPCID) 20 MG tablet One after supper 12/23/21   Nyoka Cowden, MD  FLUoxetine (PROZAC) 40 MG capsule Take 40 mg by mouth daily.    [provider]  fluticasone (FLONASE) 50 MCG/ACT nasal spray Place 2 sprays into both nostrils daily.    [provider]  gabapentin (NEURONTIN) 300 MG capsule Take 300 mg by mouth at bedtime.    [provider]  hydrochlorothiazide (HYDRODIURIL) 12.5 MG tablet Take 12.5 mg by mouth daily as needed (swelling). 03/06/21  [provider]  ketoconazole (NIZORAL) 2 % cream Apply 1 Application topically daily as needed for irritation.    [provider]  lamoTRIgine (LAMICTAL) 200 MG tablet Take 200 mg by mouth 2 (two) times daily. 06/05/19   [provider]  latanoprost (XALATAN) 0.005 % ophthalmic solution Place 1 drop into both eyes at bedtime. 04/20/13   [provider]  levothyroxine (SYNTHROID, LEVOTHROID) 50 MCG tablet Take 50 mcg by mouth daily.    [provider]  losartan (COZAAR) 25 MG tablet Take 1 tablet (25 mg total) by mouth daily. 08/14/20   Danford, Earl Lites, MD  melatonin 5 MG TABS Take 5 mg by mouth at bedtime. 11/17/17   [provider]  metoprolol tartrate (LOPRESSOR) 25 MG tablet Take 12.5 mg by mouth 2 (two) times daily. 01/18/22   [provider]  montelukast (SINGULAIR) 10 MG tablet Take 10 mg by mouth daily. 05/03/13   [provider]  Multiple Vitamins-Minerals (PRESERVISION/LUTEIN PO) Take 1 tablet by mouth 2 (two) times daily.     [provider]  Oxycodone HCl 10 MG TABS Take 1 tablet (10 mg total) by mouth every 6 (six) hours as needed (for pain). Patient taking  differently: Take 10 mg by mouth every 8 (eight) hours as needed (for pain). 08/13/20   Rolly Salter, MD  pantoprazole (PROTONIX) 40 MG tablet Take 1 tablet (40 mg total) by mouth daily. Take 30-60 min before first meal of the day 12/23/21   Nyoka Cowden, MD    Physical Exam: Vitals:   07/15/22 1137 07/15/22 1420 07/15/22 1630 07/15/22 1702  BP: (!) 150/84 (!) 148/70 (!) 135/99 (!) 158/86  Pulse: (!) 119 (!) 118 (!) 119 (!) 129  Resp: 17 17 20    Temp: 98.5 F (36.9 C) 98 F (36.7 C) 98 F (36.7 C) 98.6 F (37 C)  TempSrc: Oral   Oral  SpO2: 100% 98% 96% 97%  Weight:      Height:        Constitutional: Mildly somnolent, calm,  Vitals:   07/15/22 1137 07/15/22 1420 07/15/22 1630 07/15/22 1702  BP: (!) 150/84 (!) 148/70 (!) 135/99 (!) 158/86  Pulse: (!) 119 (!) 118 (!) 119 (!) 129  Resp: 17 17 20    Temp: 98.5 F (36.9 C) 98 F (36.7 C) 98 F (36.7 C) 98.6 F (37 C)  TempSrc: Oral   Oral  SpO2: 100% 98% 96% 97%  Weight:      Height:       Eyes: PERRL, lids and conjunctivae normal ENMT: Mucous membranes are very dry.  Neck: normal, supple, no masses, no thyromegaly Respiratory: clear to auscultation bilaterally, no wheezing, no crackles. Normal respiratory effort. No accessory muscle use.  Cardiovascular: Regular rate and rhythm, no murmurs / rubs / gallops. Trace extremity edema.  2+ pedal pulses.   Abdomen: no tenderness, no masses palpated. No hepatosplenomegaly. Bowel sounds positive.  Musculoskeletal: no clubbing / cyanosis. No joint deformity upper and lower extremities.  Skin: no rashes, lesions, ulcers. No induration Neurologic: No facial asymmetry, following directions to grip, moving lower extremities to stimulation. Psychiatric:  mildly somnolent, following some directions..   Labs on Admission: I have personally reviewed following labs and imaging studies  CBC: Recent Labs  Lab 07/15/22 1240  WBC 16.1*  NEUTROABS 13.2*  HGB 15.8*  HCT 47.2*  MCV  94.6  PLT 329   Basic Metabolic Panel: Recent Labs  Lab 07/15/22 1240  NA 137  K 3.6  CL 100  CO2 18*  GLUCOSE 109*  BUN 29*  CREATININE 0.81  CALCIUM 9.9   GFR: Estimated Creatinine Clearance: 48.6 mL/min (by C-G formula based on SCr of 0.81 mg/dL). Liver Function Tests: Recent Labs  Lab 07/15/22 1240  AST 46*  ALT 37  ALKPHOS 66  BILITOT 1.5*  PROT 7.9  ALBUMIN 4.6   No results for input(s): "LIPASE", "AMYLASE" in the last 168 hours. Recent Labs  Lab 07/15/22 1240  AMMONIA <10   CBG: Recent Labs  Lab 07/15/22 1245  GLUCAP 107*   Thyroid Function Tests: Recent Labs    07/15/22 1240  TSH 0.581   Urine analysis:    Component Value Date/Time   COLORURINE YELLOW 07/15/2022 1318   APPEARANCEUR HAZY (A) 07/15/2022 1318   APPEARANCEUR Cloudy 06/02/2012 1042   LABSPEC 1.019 07/15/2022 1318   LABSPEC 1.017 06/02/2012 1042   PHURINE 5.0 07/15/2022 1318   GLUCOSEU NEGATIVE 07/15/2022 1318   GLUCOSEU Negative 06/02/2012 1042   HGBUR SMALL (A) 07/15/2022 1318   BILIRUBINUR NEGATIVE 07/15/2022 1318   BILIRUBINUR Negative 06/02/2012 1042   KETONESUR 80 (A) 07/15/2022 1318   PROTEINUR >=300 (A) 07/15/2022 1318   UROBILINOGEN 0.2 12/11/2014 1429   NITRITE NEGATIVE 07/15/2022 1318   LEUKOCYTESUR LARGE (A) 07/15/2022 1318   LEUKOCYTESUR 3+ 06/02/2012 1042    Radiological Exams on Admission: CT HEAD WO CONTRAST  Result Date: 07/15/2022 CLINICAL DATA:  Mental status change, unknown cause EXAM: CT HEAD WITHOUT CONTRAST TECHNIQUE: Contiguous axial images were obtained from the base of the skull through the vertex without intravenous contrast. RADIATION DOSE REDUCTION: This exam was performed according to the departmental dose-optimization program which includes automated exposure control, adjustment of the mA and/or kV according to patient size and/or use of iterative reconstruction technique. COMPARISON:  CT Head 08/03/20 FINDINGS: Brain: No evidence of acute  infarction, hemorrhage, hydrocephalus, extra-axial collection or mass lesion/mass effect. Sequela of mild chronic microvascular ischemic change. Vascular: No hyperdense vessel or unexpected calcification. Skull: Normal. Negative for fracture or focal lesion. Sinuses/Orbits: No middle ear or mastoid effusion. Paranasal sinuses are clear. Bilateral lens replacement. Orbits are otherwise unremarkable. Other: None. IMPRESSION: No acute intracranial abnormality. Electronically Signed   By: Lorenza Cambridge M.D.   On: 07/15/2022 14:27    EKG: None.   Assessment/Plan Principal Problem:   Severe sepsis (HCC) Active Problems:   UTI (urinary tract infection)   Acute metabolic encephalopathy   Essential hypertension  Assessment and Plan: * Severe sepsis (HCC) Severe sepsis likely due to urinary tract infection.  Meeting severe sepsis criteria with tachycardia, heart rate 118-129, with leukocytosis of 16.1, and evidence of endorgan dysfunction- encephalopathy.  Lactic acid 1.2.  No recent urine cultures on file -Follow-up blood cultures -Add on urine cultures -1 L bolus, continue N/s 75cc/hr x 1 day -IV ceftriaxone 1 g daily  Acute metabolic encephalopathy Presenting with confused speech, on my evaluation patient is somnolent, following some directions.  CT negative for acute abnormality.  Encephalopathy likely secondary to severe sepsis, UTI, dehydration -Check chest x-ray -IV antibiotics, IV fluids -Family unable to confirm medications that patient takes - Remain NPO till improvement in mental status, hold bupropion, fluoxetine, gabapentin, lamotrignine  Essential hypertension Systolic 130s to 960A. -Hold HCTZ to allow for hydration, -Resume metoprolol, losartan   DVT prophylaxis:  Lovenox Code Status: FULL code Family Communication: None at bedside Disposition Plan: ~ 2 days Consults called: None  Admission status: Inpt Tel e I certify that  at the point of admission it is my clinical  judgment that the patient will require inpatient hospital care spanning beyond 2 midnights from the point of admission due to high intensity of service, high risk for further deterioration and high frequency of surveillance required.    Author: Onnie Boer, MD 07/15/2022 8:22 PM  For on call review [email or web address].

## 2022-07-15 NOTE — Assessment & Plan Note (Signed)
Systolic 130s to 161W. -Hold HCTZ to allow for hydration, -Resume metoprolol, losartan

## 2022-07-15 NOTE — ED Provider Notes (Signed)
[facility]MENT AT [facility] Provider Note   CSN: [id number] Arrival date & time: 07/15/22  1115     History  Chief Complaint  Patient presents with   Altered Mental Status    Heidi Fuentes is a 77 y.o. female.  77 year old female with a history of bipolar and schizophrenia, hypothyroidism, hypertension, asthma, and chronic pain on daily opiates who presents to the emergency department with altered mental status.  Patient unable to provide any history.  Son states that Saturday he saw her and she was in her normal state of health but then on Sunday another family member saw her and said that she was confused as to the day and seemed more upset than usual.  Son went to check on her today and she was talking to o'clock and did not recognize him.  Also started talking to her finger as well.  She lives by herself so he was concerned and brought her into the emergency department.  Does report that some medications were changed 2 weeks ago but is unsure what those medications were.       Home Medications Prior to Admission medications   Medication Sig Start Date End Date Taking? Authorizing Provider  buPROPion (WELLBUTRIN) 75 MG tablet Take 1 tablet (75 mg total) by mouth 3 (three) times daily. Patient taking differently: Take 75 mg by mouth every 8 (eight) hours. 08/13/20  Yes Danford, Earl Lites, MD  cetirizine (ZYRTEC) 5 MG tablet Take 5 mg by mouth at bedtime.   Yes [provider]  FLUoxetine (PROZAC) 40 MG capsule Take 40 mg by mouth daily.   Yes [provider]  gabapentin (NEURONTIN) 300 MG capsule Take 300 mg by mouth at bedtime.   Yes [provider]  hydrochlorothiazide (HYDRODIURIL) 12.5 MG tablet Take 12.5 mg by mouth daily as needed (swelling). 03/06/21  Yes [provider]  lamoTRIgine (LAMICTAL) 200 MG tablet Take 200 mg by mouth 2 (two) times daily. 06/05/19  Yes [provider]  levothyroxine  (SYNTHROID, LEVOTHROID) 50 MCG tablet Take 50 mcg by mouth daily.   Yes [provider]  losartan (COZAAR) 25 MG tablet Take 1 tablet (25 mg total) by mouth daily. 08/14/20  Yes Danford, Earl Lites, MD  melatonin 5 MG TABS Take 5 mg by mouth at bedtime. 11/17/17  Yes [provider]  metoprolol tartrate (LOPRESSOR) 25 MG tablet Take 12.5 mg by mouth 2 (two) times daily. 01/18/22  Yes [provider]  montelukast (SINGULAIR) 10 MG tablet Take 10 mg by mouth daily. 05/03/13  Yes [provider]  Multiple Vitamins-Minerals (PRESERVISION/LUTEIN PO) Take 1 tablet by mouth 2 (two) times daily.    Yes [provider]  acetaminophen (TYLENOL) 500 MG tablet Take 500 mg by mouth every 6 (six) hours as needed for mild pain or moderate pain. For pain    [provider]  albuterol (PROVENTIL) (2.5 MG/3ML) 0.083% nebulizer solution Take 3 mLs (2.5 mg total) by nebulization every 6 (six) hours as needed for wheezing or shortness of breath. 02/04/22   Nyoka Cowden, MD  albuterol (VENTOLIN HFA) 108 (90 Base) MCG/ACT inhaler Inhale 2 puffs into the lungs every 6 (six) hours as needed for wheezing or shortness of breath. For shortness of breath    [provider]  beclomethasone (QVAR) 80 MCG/ACT inhaler Inhale 1 puff into the lungs daily. 06/03/22   Oretha Milch, MD  Budeson-Glycopyrrol-Formoterol (BREZTRI AEROSPHERE) 160-9-4.8 MCG/ACT AERO Inhale 2  puffs into the lungs in the morning and at bedtime. Patient taking differently: Inhale 1 puff into the lungs in the morning and at bedtime. 03/18/20   Oretha Milch, MD  chlorhexidine (PERIDEX) 0.12 % solution Use as directed 15 mLs in the mouth or throat 2 (two) times daily. 07/09/21   [provider]  cholecalciferol (VITAMIN D3) 25 MCG (1000 UNIT) tablet Take 1,000 Units by mouth daily.    [provider]  famotidine (PEPCID) 20 MG tablet One after supper 12/23/21   Nyoka Cowden, MD   fluticasone [facility]) 50 MCG/ACT nasal spray Place 2 sprays into both nostrils daily.    [provider]  ketoconazole (NIZORAL) 2 % cream Apply 1 Application topically daily as needed for irritation.    [provider]  latanoprost (XALATAN) 0.005 % ophthalmic solution Place 1 drop into both eyes at bedtime. 04/20/13   [provider]  Oxycodone HCl 10 MG TABS Take 1 tablet (10 mg total) by mouth every 6 (six) hours as needed (for pain). Patient taking differently: Take 10 mg by mouth every 8 (eight) hours as needed (for pain). 08/13/20   Rolly Salter, MD  pantoprazole (PROTONIX) 40 MG tablet Take 1 tablet (40 mg total) by mouth daily. Take 30-60 min before first meal of the day 12/23/21   Nyoka Cowden, MD      Allergies    Abilify [aripiprazole], Geodon [ziprasidone hcl], Quetiapine, Sulfa antibiotics, and Trazodone and nefazodone    Review of Systems   Review of Systems  Physical Exam Updated Vital Signs BP (!) 168/67 (BP Location: Left Arm)   Pulse (!) 107   Temp 98.4 F (36.9 C) (Axillary)   Resp 20   Ht 5' (1.524 m)   Wt 64 kg   SpO2 98%   BMI 27.56 kg/m  Physical Exam Vitals and nursing note reviewed.  Constitutional:      General: She is not in acute distress.    Appearance: She is well-developed.     Comments: Alert and oriented to self only.  When asked about the date she says "I know but do you?"  Then refuses to answer the date.  HENT:     Head: Normocephalic and atraumatic.     Right Ear: External ear normal.     Left Ear: External ear normal.     Nose: Nose normal.  Eyes:     Extraocular Movements: Extraocular movements intact.     Conjunctiva/sclera: Conjunctivae normal.     Pupils: Pupils are equal, round, and reactive to light.  Cardiovascular:     Rate and Rhythm: Regular rhythm. Tachycardia present.     Heart sounds: No murmur heard. Pulmonary:     Effort: Pulmonary effort is normal. No respiratory distress.     Breath  sounds: Normal breath sounds.  Abdominal:     General: Abdomen is flat. There is no distension.     Palpations: Abdomen is soft. There is no mass.     Tenderness: There is no abdominal tenderness. There is no guarding.  Musculoskeletal:     Cervical back: Normal range of motion and neck supple.     Right lower leg: No edema.     Left lower leg: No edema.  Skin:    General: Skin is warm and dry.  Neurological:     Mental Status: She is alert and oriented to person, place, and time. Mental status is at baseline.  Psychiatric:  Mood and Affect: Mood normal.     ED Results / Procedures / Treatments   Labs (all labs ordered are listed, but only abnormal results are displayed) Labs Reviewed  COMPREHENSIVE METABOLIC PANEL - Abnormal; Notable for the following components:      Result Value   CO2 18 (*)    Glucose, Bld 109 (*)    BUN 29 (*)    AST 46 (*)    Total Bilirubin 1.5 (*)    Anion gap 19 (*)    All other components within normal limits  CBC WITH DIFFERENTIAL/PLATELET - Abnormal; Notable for the following components:   WBC 16.1 (*)    Hemoglobin 15.8 (*)    HCT 47.2 (*)    Neutro Abs 13.2 (*)    Monocytes Absolute 1.2 (*)    All other components within normal limits  URINALYSIS, ROUTINE W REFLEX MICROSCOPIC - Abnormal; Notable for the following components:   APPearance HAZY (*)    Hgb urine dipstick SMALL (*)    Ketones, ur 80 (*)    Protein, ur >=300 (*)    Leukocytes,Ua LARGE (*)    Bacteria, UA RARE (*)    All other components within normal limits  BASIC METABOLIC PANEL - Abnormal; Notable for the following components:   Potassium 3.3 (*)    CO2 19 (*)    All other components within normal limits  CBC - Abnormal; Notable for the following components:   WBC 11.7 (*)    All other components within normal limits  CBG MONITORING, ED - Abnormal; Notable for the following components:   Glucose-Capillary 107 (*)    All other components within normal limits   CULTURE, BLOOD (ROUTINE X 2)  CULTURE, BLOOD (ROUTINE X 2)  URINE CULTURE  AMMONIA  RAPID URINE DRUG SCREEN, HOSP PERFORMED  ETHANOL  TSH  LACTIC ACID, PLASMA    EKG None  Radiology DG CHEST PORT 1 VIEW  Result Date: 07/15/2022 CLINICAL DATA:  Severe sepsis.  Altered mental status. EXAM: PORTABLE CHEST 1 VIEW COMPARISON:  08/03/2020 FINDINGS: Patient rotation limits examination. Shallow inspiration with elevation of left hemidiaphragm. Heart size is normal. Lungs appear clear and expanded. No pleural effusions. No pneumothorax. Mediastinal contours appear intact. Calcification of the aorta. Degenerative changes in the spine and shoulders. No change since prior study. IMPRESSION: Shallow inspiration.  No evidence of active pulmonary disease. Electronically Signed   By: Burman Nieves M.D.   On: 07/15/2022 22:44   CT HEAD WO CONTRAST  Result Date: 07/15/2022 CLINICAL DATA:  Mental status change, unknown cause EXAM: CT HEAD WITHOUT CONTRAST TECHNIQUE: Contiguous axial images were obtained from the base of the skull through the vertex without intravenous contrast. RADIATION DOSE REDUCTION: This exam was performed according to the departmental dose-optimization program which includes automated exposure control, adjustment of the mA and/or kV according to patient size and/or use of iterative reconstruction technique. COMPARISON:  CT Head 08/03/20 FINDINGS: Brain: No evidence of acute infarction, hemorrhage, hydrocephalus, extra-axial collection or mass lesion/mass effect. Sequela of mild chronic microvascular ischemic change. Vascular: No hyperdense vessel or unexpected calcification. Skull: Normal. Negative for fracture or focal lesion. Sinuses/Orbits: No middle ear or mastoid effusion. Paranasal sinuses are clear. Bilateral lens replacement. Orbits are otherwise unremarkable. Other: None. IMPRESSION: No acute intracranial abnormality. Electronically Signed   By: Lorenza Cambridge M.D.   On: 07/15/2022  14:27    Procedures Procedures    Medications Ordered in ED Medications  cefTRIAXone (ROCEPHIN) 1 g in  sodium chloride 0.9 % 100 mL IVPB (1 g Intravenous New Bag/Given 07/15/22 1641)  0.9 % NaCl with KCl 20 mEq/ L  infusion ( Intravenous New Bag/Given 07/16/22 0820)  albuterol (PROVENTIL) (2.5 MG/3ML) 0.083% nebulizer solution 2.5 mg (has no administration in time range)  latanoprost (XALATAN) 0.005 % ophthalmic solution 1 drop (1 drop Both Eyes Given 07/15/22 2211)  enoxaparin (LOVENOX) injection 40 mg (40 mg Subcutaneous Given 07/15/22 2110)  acetaminophen (TYLENOL) tablet 650 mg (has no administration in time range)    Or  acetaminophen (TYLENOL) suppository 650 mg (has no administration in time range)  ondansetron (ZOFRAN) tablet 4 mg (has no administration in time range)    Or  ondansetron (ZOFRAN) injection 4 mg (has no administration in time range)  mometasone-formoterol (DULERA) 200-5 MCG/ACT inhaler 2 puff (2 puffs Inhalation Not Given 07/16/22 0727)  umeclidinium bromide (INCRUSE ELLIPTA) 62.5 MCG/ACT 1 puff (1 puff Inhalation Given 07/16/22 0726)  liver oil-zinc oxide (DESITIN) 40 % ointment (has no administration in time range)  sodium chloride 0.9 % bolus 1,000 mL (1,000 mLs Intravenous New Bag/Given 07/15/22 1829)    ED Course/ Medical Decision Making/ A&P Clinical Course as of 07/16/22 1033  Wed Jul 15, 2022  1617 Dr Mariea Clonts from hospitalist to admit the patient. [RP]    Clinical Course User Index [RP] Rondel Baton, MD                             Medical Decision Making Amount and/or Complexity of Data Reviewed Labs: ordered. Radiology: ordered.  Risk Decision regarding hospitalization.   Heidi Fuentes is a 77 y.o. female with comorbidities that complicate the patient evaluation including history of bipolar and schizophrenia, hypothyroidism, hypertension, asthma, and chronic pain on daily opiates who presents to the emergency department with altered mental  status.   Initial Ddx:  UTI, psychosis, medication side effect, withdrawals, stroke, ICH  MDM/Course:  Initially had a broad differential for the patient given her symptoms and the fact that she cannot provide any additional history.  No focal neurologic deficits to suggest a stroke.  Set up for broad workup which included a head CT and labs which revealed that the patient had a urinary tract infection.  Also had a leukocytosis of 16 and was mildly tachycardic.  Was concerned about possible sepsis for the patient and she was started on Rocephin and had blood cultures drawn.  Feel that she is likely delirious from her urinary tract infection.  Also considered possible psychosis if her medications were changed and she is not adequately treated with his medications.  Upon re-evaluation patient main confused and since she lives alone we will admit her to the hospital for further management.  This patient presents to the ED for concern of complaints listed in HPI, this involves an extensive number of treatment options, and is a complaint that carries with it a high risk of complications and morbidity. Disposition including potential need for admission considered.   Dispo: Admit to Floor  Additional history obtained from son Records reviewed Outpatient Clinic Notes The following labs were independently interpreted: Urinalysis and show urinary tract infection I independently reviewed the following imaging with scope of interpretation limited to determining acute life threatening conditions related to emergency care: CT Head and agree with the radiologist interpretation with the following exceptions: none I personally reviewed and interpreted cardiac monitoring: sinus tachycardia I personally reviewed and interpreted the pt's EKG: see above  for interpretation  I have reviewed the patients home medications and made adjustments as needed Consults: Hospitalist Social Determinants of health:   Elderly         Final Clinical Impression(s) / ED Diagnoses Final diagnoses:  Delirium  Acute cystitis without hematuria  Sepsis, due to unspecified organism, unspecified whether acute organ dysfunction present [facility])    Rx / DC Orders ED Discharge Orders     None         Rondel Baton, MD 07/16/22 [phone]

## 2022-07-15 NOTE — Assessment & Plan Note (Addendum)
Presenting with confused speech, on my evaluation patient is somnolent, following some directions.  CT negative for acute abnormality.  Encephalopathy likely secondary to severe sepsis, UTI, dehydration -Check chest x-ray -IV antibiotics, IV fluids -Family unable to confirm medications that patient takes - Remain NPO till improvement in mental status, hold bupropion, fluoxetine, gabapentin, lamotrignine

## 2022-07-15 NOTE — ED Triage Notes (Signed)
Pt brought in by [facility] from home with c/o AMS. Pt LKW on Saturday. Son called her this morning and realized she wasn't answering him normally so he came over to check on her and found her more altered than normal. Per EMS, pt has slight confusion at her baseline. Pt lives alone. Hx of UTI. EMS reports foul smelling urine. CBG 117 & vitals WNL for EMS.

## 2022-07-16 DIAGNOSIS — A419 Sepsis, unspecified organism: Secondary | ICD-10-CM | POA: Diagnosis not present

## 2022-07-16 DIAGNOSIS — R6520 Severe sepsis without septic shock: Secondary | ICD-10-CM | POA: Diagnosis not present

## 2022-07-16 LAB — BASIC METABOLIC PANEL
Anion gap: 14 (ref 5–15)
BUN: 20 mg/dL (ref 8–23)
CO2: 19 mmol/L — ABNORMAL LOW (ref 22–32)
Calcium: 8.9 mg/dL (ref 8.9–10.3)
Chloride: 106 mmol/L (ref 98–111)
Creatinine, Ser: 0.66 mg/dL (ref 0.44–1.00)
GFR, Estimated: >60 mL/min (ref >60)
Glucose, Bld: 85 mg/dL (ref 70–99)
Potassium: 3.3 mmol/L — ABNORMAL LOW (ref 3.5–5.1)
Sodium: 139 mmol/L (ref 135–145)

## 2022-07-16 LAB — CBC
HCT: 41.1 % (ref 36.0–46.0)
Hemoglobin: 13.4 g/dL (ref 12.0–15.0)
MCH: 31.3 pg (ref 26.0–34.0)
MCHC: 32.6 g/dL (ref 30.0–36.0)
MCV: 96 fL (ref 80.0–100.0)
Platelets: 269 10*3/uL (ref 150–400)
RBC: 4.28 MIL/uL (ref 3.87–5.11)
RDW: 13.6 % (ref 11.5–15.5)
WBC: 11.7 10*3/uL — ABNORMAL HIGH (ref 4.0–10.5)
nRBC: 0 % (ref 0.0–0.2)

## 2022-07-16 LAB — CULTURE, BLOOD (ROUTINE X 2): Special Requests: ADEQUATE

## 2022-07-16 LAB — URINE CULTURE

## 2022-07-16 MED ORDER — METOPROLOL TARTRATE 25 MG PO TABS
12.5000 mg | ORAL_TABLET | Freq: Two times a day (BID) | ORAL | Status: DC
  Administered 2022-07-17 – 2022-07-28 (×16): 12.5 mg via ORAL
  Filled 2022-07-16 (×22): qty 1

## 2022-07-16 MED ORDER — LOSARTAN POTASSIUM 50 MG PO TABS
25.0000 mg | ORAL_TABLET | Freq: Every day | ORAL | Status: DC
  Administered 2022-07-18 – 2022-07-28 (×8): 25 mg via ORAL
  Filled 2022-07-16 (×11): qty 1

## 2022-07-16 MED ORDER — ZINC OXIDE 40 % EX OINT
TOPICAL_OINTMENT | Freq: Every day | CUTANEOUS | Status: DC
  Administered 2022-07-28: 1 via TOPICAL
  Filled 2022-07-16: qty 57

## 2022-07-16 MED ORDER — HYDRALAZINE HCL 20 MG/ML IJ SOLN
10.0000 mg | INTRAMUSCULAR | Status: DC | PRN
  Administered 2022-07-16 – 2022-07-17 (×2): 10 mg via INTRAVENOUS
  Filled 2022-07-16 (×2): qty 1

## 2022-07-16 NOTE — Progress Notes (Signed)
This nurse went in to give her scheduled bp meds and pt is swatting at me, and states " i dont want it."  Attempted x3. Notified. MD

## 2022-07-16 NOTE — Progress Notes (Signed)
Patient grew more confused throughout the night. She stared off in the direction of the TV and talked about her dad, and babies on the train tracks and church service that killed people. She became upset when she talked about these things. She didn't get much sleep but remained in bed and did not pull at any of her wires.

## 2022-07-16 NOTE — Progress Notes (Signed)
PROGRESS NOTE    Heidi Fuentes  ZOX:[id number] DOB: 1945/06/18 DOA: 07/15/2022 PCP: Tanna Furry, MD   Brief Narrative:    Heidi Fuentes is a 77 y.o. female with medical history significant for asthma, bipolar disorder, chronic pain, hypertension, bronchiectasis. Patient was brought to the ED via EMS reports of altered mental status.  Patient was admitted for acute metabolic encephalopathy in the setting of severe sepsis likely due to UTI.  Assessment & Plan:   Principal Problem:   Severe sepsis (HCC) Active Problems:   UTI (urinary tract infection)   Acute metabolic encephalopathy   Essential hypertension  Assessment and Plan:   Severe sepsis (HCC) Severe sepsis likely due to urinary tract infection.  Meeting severe sepsis criteria with tachycardia, heart rate 118-129, with leukocytosis of 16.1, and evidence of endorgan dysfunction- encephalopathy.  Lactic acid 1.2.  No recent urine cultures on file -Blood cultures with no growth noted thus far -Add on urine cultures -1 L bolus, continue N/s 75cc/hr x 1 day -IV ceftriaxone 1 g daily   Acute metabolic encephalopathy Presenting with confused speech, on my evaluation patient is somnolent, following some directions.  CT negative for acute abnormality.  Encephalopathy likely secondary to severe sepsis, UTI, dehydration -Check chest x-ray -IV antibiotics, IV fluids -TSH 0.581 -Family unable to confirm medications that patient takes - Remain NPO till improvement in mental status, hold bupropion, fluoxetine, gabapentin, lamotrignine  Mild hypokalemia -Replete and reevaluate in a.m.   Essential hypertension Systolic 130s to 811B. -Hold HCTZ to allow for hydration, -Resume metoprolol, losartan    DVT prophylaxis:Lovenox Code Status: Full Family Communication:  Disposition Plan:  Status is: Inpatient Remains inpatient appropriate because: Need for IV medications   Consultants:  None  Procedures:   None  Antimicrobials:  Anti-infectives (From admission, onward)    Start     Dose/Rate Route Frequency Ordered Stop   07/15/22 1630  cefTRIAXone (ROCEPHIN) 1 g in sodium chloride 0.9 % 100 mL IVPB        1 g 200 mL/hr over 30 Minutes Intravenous Every 24 hours 07/15/22 1617         Subjective: Patient seen and evaluated today and appears quite somnolent, but arousable.  Does not readily answer questions.  Objective: Vitals:   07/15/22 2125 07/16/22 0020 07/16/22 0346 07/16/22 0726  BP:  (!) 166/79 (!) 168/67   Pulse:  (!) 107 (!) 107   Resp:  20 20   Temp:  99.1 F (37.3 C) 98.4 F (36.9 C)   TempSrc:  Axillary Axillary   SpO2: 96% 96% 98% 98%  Weight:      Height:        Intake/Output Summary (Last 24 hours) at 07/16/2022 1212 Last data filed at 07/16/2022 1100 Gross per 24 hour  Intake 1030.53 ml  Output --  Net 1030.53 ml   Filed Weights   07/15/22 1130  Weight: 64 kg    Examination:  General exam: Appears somnolent Respiratory system: Clear to auscultation. Respiratory effort normal. Cardiovascular system: S1 & S2 heard, RRR.  Gastrointestinal system: Abdomen is soft Central nervous system: Somnolent Extremities: No edema Skin: No significant lesions noted Psychiatry: Flat affect.    Data Reviewed: I have personally reviewed following labs and imaging studies  CBC: Recent Labs  Lab 07/15/22 1240 07/16/22 0418  WBC 16.1* 11.7*  NEUTROABS 13.2*  --   HGB 15.8* 13.4  HCT 47.2* 41.1  MCV 94.6 96.0  PLT 329 269   Basic  Metabolic Panel: Recent Labs  Lab 07/15/22 1240 07/16/22 0418  NA 137 139  K 3.6 3.3*  CL 100 106  CO2 18* 19*  GLUCOSE 109* 85  BUN 29* 20  CREATININE 0.81 0.66  CALCIUM 9.9 8.9   GFR: Estimated Creatinine Clearance: 49.2 mL/min (by C-G formula based on SCr of 0.66 mg/dL). Liver Function Tests: Recent Labs  Lab 07/15/22 1240  AST 46*  ALT 37  ALKPHOS 66  BILITOT 1.5*  PROT 7.9  ALBUMIN 4.6   No results for  input(s): "LIPASE", "AMYLASE" in the last 168 hours. Recent Labs  Lab 07/15/22 1240  AMMONIA <10   Coagulation Profile: No results for input(s): "INR", "PROTIME" in the last 168 hours. Cardiac Enzymes: No results for input(s): "CKTOTAL", "CKMB", "CKMBINDEX", "TROPONINI" in the last 168 hours. BNP (last 3 results) No results for input(s): "PROBNP" in the last 8760 hours. HbA1C: No results for input(s): "HGBA1C" in the last 72 hours. CBG: Recent Labs  Lab 07/15/22 1245  GLUCAP 107*   Lipid Profile: No results for input(s): "CHOL", "HDL", "LDLCALC", "TRIG", "CHOLHDL", "LDLDIRECT" in the last 72 hours. Thyroid Function Tests: Recent Labs    07/15/22 1240  TSH 0.581   Anemia Panel: No results for input(s): "VITAMINB12", "FOLATE", "FERRITIN", "TIBC", "IRON", "RETICCTPCT" in the last 72 hours. Sepsis Labs: Recent Labs  Lab 07/15/22 1629  LATICACIDVEN 1.2    Recent Results (from the past 240 hour(s))  Culture, blood (Routine X 2) w Reflex to ID Panel     Status: None (Preliminary result)   Collection Time: 07/15/22  4:29 PM   Specimen: BLOOD  Result Value Ref Range Status   Specimen Description BLOOD BLOOD RIGHT ARM  Final   Special Requests   Final    BOTTLES DRAWN AEROBIC AND ANAEROBIC Blood Culture adequate volume   Culture   Final    NO GROWTH < 24 HOURS Performed at [facility], [address], [place], [place] [address]    Report Status PENDING  Incomplete  Culture, blood (Routine X 2) w Reflex to ID Panel     Status: None (Preliminary result)   Collection Time: 07/15/22  4:38 PM   Specimen: Site Not Specified; Blood  Result Value Ref Range Status   Specimen Description SITE NOT SPECIFIED  Final   Special Requests   Final    BOTTLES DRAWN AEROBIC AND ANAEROBIC Blood Culture adequate volume   Culture   Final    NO GROWTH < 24 HOURS Performed at [facility], [address], [place], [place] [address]    Report Status PENDING  Incomplete          Radiology Studies: DG CHEST PORT 1 VIEW  Result Date: 07/15/2022 CLINICAL DATA:  Severe sepsis.  Altered mental status. EXAM: PORTABLE CHEST 1 VIEW COMPARISON:  08/03/2020 FINDINGS: Patient rotation limits examination. Shallow inspiration with elevation of left hemidiaphragm. Heart size is normal. Lungs appear clear and expanded. No pleural effusions. No pneumothorax. Mediastinal contours appear intact. Calcification of the aorta. Degenerative changes in the spine and shoulders. No change since prior study. IMPRESSION: Shallow inspiration.  No evidence of active pulmonary disease. Electronically Signed   By: Burman Nieves M.D.   On: 07/15/2022 22:44   CT HEAD WO CONTRAST  Result Date: 07/15/2022 CLINICAL DATA:  Mental status change, unknown cause EXAM: CT HEAD WITHOUT CONTRAST TECHNIQUE: Contiguous axial images were obtained from the base of the skull through the vertex without intravenous contrast. RADIATION DOSE REDUCTION: This exam was  performed according to the departmental dose-optimization program which includes automated exposure control, adjustment of the mA and/or kV according to patient size and/or use of iterative reconstruction technique. COMPARISON:  CT Head 08/03/20 FINDINGS: Brain: No evidence of acute infarction, hemorrhage, hydrocephalus, extra-axial collection or mass lesion/mass effect. Sequela of mild chronic microvascular ischemic change. Vascular: No hyperdense vessel or unexpected calcification. Skull: Normal. Negative for fracture or focal lesion. Sinuses/Orbits: No middle ear or mastoid effusion. Paranasal sinuses are clear. Bilateral lens replacement. Orbits are otherwise unremarkable. Other: None. IMPRESSION: No acute intracranial abnormality. Electronically Signed   By: Lorenza Cambridge M.D.   On: 07/15/2022 14:27        Scheduled Meds:  enoxaparin (LOVENOX) injection  40 mg Subcutaneous Q24H   latanoprost  1 drop Both Eyes QHS   liver oil-zinc oxide   Topical  Daily   mometasone-formoterol  2 puff Inhalation BID   umeclidinium bromide  1 puff Inhalation Daily   Continuous Infusions:  0.9 % NaCl with KCl 20 mEq / L 75 mL/hr at 07/16/22 0820   cefTRIAXone (ROCEPHIN)  IV 1 g (07/15/22 1641)     LOS: 1 day    Time spent: 35 minutes    Briggette Najarian Hoover Brunette, DO [facility]  If 7PM-7AM, please contact night-coverage [email or web address] 07/16/2022, 12:12 PM

## 2022-07-16 NOTE — H&P (Signed)
Transition of Care [facility]) - Inpatient Brief Assessment   Patient Details  Name: Heidi Fuentes MRN: [id number] Date of Birth: November 13, 1945  Transition of Care [facility]) CM/SW Contact:    Annice Needy, LCSW Phone Number: 07/16/2022, 11:59 AM   Clinical Narrative: Transition of Care Department [facility]) has reviewed patient and no TOC needs have been identified at this time. We will continue to monitor patient advancement through interdisciplinary progression rounds. If new patient transition needs arise, please place a TOC consult.   Transition of Care Asessment: Insurance and Status: Insurance coverage has been reviewed Patient has primary care physician: Yes Home environment has been reviewed: from home alone Prior level of function:: independent Prior/Current Home Services: No current home services Social Determinants of Health Reivew: SDOH reviewed no interventions necessary Readmission risk has been reviewed: Yes Transition of care needs: no transition of care needs at this time

## 2022-07-17 DIAGNOSIS — R6520 Severe sepsis without septic shock: Secondary | ICD-10-CM | POA: Diagnosis not present

## 2022-07-17 DIAGNOSIS — A419 Sepsis, unspecified organism: Secondary | ICD-10-CM | POA: Diagnosis not present

## 2022-07-17 LAB — CBC
HCT: 47.1 % — ABNORMAL HIGH (ref 36.0–46.0)
Hemoglobin: 15.6 g/dL — ABNORMAL HIGH (ref 12.0–15.0)
MCH: 32.2 pg (ref 26.0–34.0)
MCHC: 33.1 g/dL (ref 30.0–36.0)
MCV: 97.1 fL (ref 80.0–100.0)
Platelets: 260 10*3/uL (ref 150–400)
RBC: 4.85 MIL/uL (ref 3.87–5.11)
RDW: 13.1 % (ref 11.5–15.5)
WBC: 13.6 10*3/uL — ABNORMAL HIGH (ref 4.0–10.5)
nRBC: 0 % (ref 0.0–0.2)

## 2022-07-17 LAB — BASIC METABOLIC PANEL
Anion gap: 17 — ABNORMAL HIGH (ref 5–15)
BUN: 14 mg/dL (ref 8–23)
CO2: 21 mmol/L — ABNORMAL LOW (ref 22–32)
Calcium: 9.6 mg/dL (ref 8.9–10.3)
Chloride: 102 mmol/L (ref 98–111)
Creatinine, Ser: 0.53 mg/dL (ref 0.44–1.00)
GFR, Estimated: >60 mL/min (ref >60)
Glucose, Bld: 82 mg/dL (ref 70–99)
Potassium: 3.4 mmol/L — ABNORMAL LOW (ref 3.5–5.1)
Sodium: 140 mmol/L (ref 135–145)

## 2022-07-17 LAB — MAGNESIUM: Magnesium: 2.2 mg/dL (ref 1.7–2.4)

## 2022-07-17 MED ORDER — POTASSIUM CHLORIDE 10 MEQ/100ML IV SOLN
10.0000 meq | INTRAVENOUS | Status: AC
  Administered 2022-07-17 (×4): 10 meq via INTRAVENOUS
  Filled 2022-07-17 (×4): qty 100

## 2022-07-17 MED ORDER — POTASSIUM CHLORIDE CRYS ER 20 MEQ PO TBCR
40.0000 meq | EXTENDED_RELEASE_TABLET | Freq: Once | ORAL | Status: DC
  Filled 2022-07-17: qty 2

## 2022-07-17 NOTE — Care Management Important Message (Signed)
Important Message  Patient Details  Name: Heidi Fuentes MRN: [id number] Date of Birth: [birth year]   Medicare Important Message Given:  N/A - LOS <3 / Initial given by admissions     Corey Harold 07/17/2022, 11:24 AM

## 2022-07-17 NOTE — Progress Notes (Signed)
PROGRESS NOTE    Heidi Fuentes  WUJ:[id number] DOB: [birth year] DOA: 07/15/2022 PCP: Tanna Furry, MD   Brief Narrative:    Heidi Fuentes is a 77 y.o. female with medical history significant for asthma, bipolar disorder, chronic pain, hypertension, bronchiectasis. Patient was brought to the ED via EMS reports of altered mental status.  Patient was admitted for acute metabolic encephalopathy in the setting of severe sepsis likely due to UTI.  Assessment & Plan:   Principal Problem:   Severe sepsis (HCC) Active Problems:   UTI (urinary tract infection)   Acute metabolic encephalopathy   Essential hypertension  Assessment and Plan:   Severe sepsis (HCC) Severe sepsis likely due to urinary tract infection.  Meeting severe sepsis criteria with tachycardia, heart rate 118-129, with leukocytosis of 16.1, and evidence of endorgan dysfunction- encephalopathy.  Lactic acid 1.2.  No recent urine cultures on file -Blood cultures with no growth noted thus far -Urine cultures with mult. sp -1 L bolus, continue N/s 75cc/hr x 1 day -IV ceftriaxone 1 g daily   Acute metabolic encephalopathy Presenting with confused speech, on my evaluation patient is somnolent, following some directions.  CT negative for acute abnormality.  Encephalopathy likely secondary to severe sepsis, UTI, dehydration -Check chest x-ray -IV antibiotics, IV fluids -TSH 0.581 -Family unable to confirm medications that patient takes - Remain NPO till improvement in mental status, hold bupropion, fluoxetine, gabapentin, lamotrignine -PT/OT evaluation  Mild hypokalemia -Replete and reevaluate in a.m.   Essential hypertension Systolic 130s to 956O. -Hold HCTZ to allow for hydration, -Resume metoprolol, losartan -Refusing home medications at this time    DVT prophylaxis:Lovenox Code Status: Full Family Communication: Discussed with son on phone 6/14 Disposition Plan:  Status is: Inpatient Remains  inpatient appropriate because: Need for IV medications   Consultants:  None  Procedures:  None  Antimicrobials:  Anti-infectives (From admission, onward)    Start     Dose/Rate Route Frequency Ordered Stop   07/15/22 1630  cefTRIAXone (ROCEPHIN) 1 g in sodium chloride 0.9 % 100 mL IVPB        1 g 200 mL/hr over 30 Minutes Intravenous Every 24 hours 07/15/22 1617         Subjective: Patient seen and evaluated today and appears pleasantly confused and cannot answer questions appropriately.  Objective: Vitals:   07/16/22 1600 07/16/22 1800 07/16/22 2031 07/17/22 0539  BP: (!) 182/90 (!) 156/95 (!) 151/94 (!) 179/107  Pulse: 91  100 (!) 104  Resp:   18 20  Temp:   98.3 F (36.8 C) 98.7 F (37.1 C)  TempSrc:      SpO2:   93%   Weight:      Height:        Intake/Output Summary (Last 24 hours) at 07/17/2022 1034 Last data filed at 07/17/2022 0900 Gross per 24 hour  Intake 1199.45 ml  Output --  Net 1199.45 ml   Filed Weights   07/15/22 1130  Weight: 64 kg    Examination:  General exam: Appears pleasantly confused Respiratory system: Clear to auscultation. Respiratory effort normal. Cardiovascular system: S1 & S2 heard, RRR.  Gastrointestinal system: Abdomen is soft Central nervous system: Confused Extremities: No edema Skin: No significant lesions noted Psychiatry: Flat affect.    Data Reviewed: I have personally reviewed following labs and imaging studies  CBC: Recent Labs  Lab 07/15/22 1240 07/16/22 0418 07/17/22 0435  WBC 16.1* 11.7* 13.6*  NEUTROABS 13.2*  --   --  HGB 15.8* 13.4 15.6*  HCT 47.2* 41.1 47.1*  MCV 94.6 96.0 97.1  PLT 329 269 260   Basic Metabolic Panel: Recent Labs  Lab 07/15/22 1240 07/16/22 0418 07/17/22 0435  NA 137 139 140  K 3.6 3.3* 3.4*  CL 100 106 102  CO2 18* 19* 21*  GLUCOSE 109* 85 82  BUN 29* 20 14  CREATININE 0.81 0.66 0.53  CALCIUM 9.9 8.9 9.6  MG  --   --  2.2   GFR: Estimated Creatinine  Clearance: 49.2 mL/min (by C-G formula based on SCr of 0.53 mg/dL). Liver Function Tests: Recent Labs  Lab 07/15/22 1240  AST 46*  ALT 37  ALKPHOS 66  BILITOT 1.5*  PROT 7.9  ALBUMIN 4.6   No results for input(s): "LIPASE", "AMYLASE" in the last 168 hours. Recent Labs  Lab 07/15/22 1240  AMMONIA <10   Coagulation Profile: No results for input(s): "INR", "PROTIME" in the last 168 hours. Cardiac Enzymes: No results for input(s): "CKTOTAL", "CKMB", "CKMBINDEX", "TROPONINI" in the last 168 hours. BNP (last 3 results) No results for input(s): "PROBNP" in the last 8760 hours. HbA1C: No results for input(s): "HGBA1C" in the last 72 hours. CBG: Recent Labs  Lab 07/15/22 1245  GLUCAP 107*   Lipid Profile: No results for input(s): "CHOL", "HDL", "LDLCALC", "TRIG", "CHOLHDL", "LDLDIRECT" in the last 72 hours. Thyroid Function Tests: Recent Labs    07/15/22 1240  TSH 0.581   Anemia Panel: No results for input(s): "VITAMINB12", "FOLATE", "FERRITIN", "TIBC", "IRON", "RETICCTPCT" in the last 72 hours. Sepsis Labs: Recent Labs  Lab 07/15/22 1629  LATICACIDVEN 1.2    Recent Results (from the past 240 hour(s))  Urine Culture (for pregnant, neutropenic or urologic patients or patients with an indwelling urinary catheter)     Status: Abnormal   Collection Time: 07/15/22  4:18 PM   Specimen: Urine, Clean Catch  Result Value Ref Range Status   Specimen Description   Final    URINE, CLEAN CATCH Performed at [facility], [address], [place], [place] [address]    Special Requests   Final    NONE Performed at [facility], [address], [place], [place] [address]    Culture MULTIPLE SPECIES PRESENT, SUGGEST RECOLLECTION (A)  Final   Report Status 07/16/2022 FINAL  Final  Culture, blood (Routine X 2) w Reflex to ID Panel     Status: None (Preliminary result)   Collection Time: 07/15/22  4:29 PM   Specimen: BLOOD  Result Value Ref Range Status   Specimen Description BLOOD  BLOOD RIGHT ARM  Final   Special Requests   Final    BOTTLES DRAWN AEROBIC AND ANAEROBIC Blood Culture adequate volume   Culture   Final    NO GROWTH 2 DAYS Performed at [facility], [address], [place], [place] [address]    Report Status PENDING  Incomplete  Culture, blood (Routine X 2) w Reflex to ID Panel     Status: None (Preliminary result)   Collection Time: 07/15/22  4:38 PM   Specimen: Site Not Specified; Blood  Result Value Ref Range Status   Specimen Description SITE NOT SPECIFIED  Final   Special Requests   Final    BOTTLES DRAWN AEROBIC AND ANAEROBIC Blood Culture adequate volume   Culture   Final    NO GROWTH 2 DAYS Performed at [facility], [address] [place], [place] [address]    Report Status PENDING  Incomplete  Radiology Studies: DG CHEST PORT 1 VIEW  Result Date: 07/15/2022 CLINICAL DATA:  Severe sepsis.  Altered mental status. EXAM: PORTABLE CHEST 1 VIEW COMPARISON:  08/03/2020 FINDINGS: Patient rotation limits examination. Shallow inspiration with elevation of left hemidiaphragm. Heart size is normal. Lungs appear clear and expanded. No pleural effusions. No pneumothorax. Mediastinal contours appear intact. Calcification of the aorta. Degenerative changes in the spine and shoulders. No change since prior study. IMPRESSION: Shallow inspiration.  No evidence of active pulmonary disease. Electronically Signed   By: Burman Nieves M.D.   On: 07/15/2022 22:44   CT HEAD WO CONTRAST  Result Date: 07/15/2022 CLINICAL DATA:  Mental status change, unknown cause EXAM: CT HEAD WITHOUT CONTRAST TECHNIQUE: Contiguous axial images were obtained from the base of the skull through the vertex without intravenous contrast. RADIATION DOSE REDUCTION: This exam was performed according to the departmental dose-optimization program which includes automated exposure control, adjustment of the mA and/or kV according to patient size and/or use of iterative reconstruction  technique. COMPARISON:  CT Head 08/03/20 FINDINGS: Brain: No evidence of acute infarction, hemorrhage, hydrocephalus, extra-axial collection or mass lesion/mass effect. Sequela of mild chronic microvascular ischemic change. Vascular: No hyperdense vessel or unexpected calcification. Skull: Normal. Negative for fracture or focal lesion. Sinuses/Orbits: No middle ear or mastoid effusion. Paranasal sinuses are clear. Bilateral lens replacement. Orbits are otherwise unremarkable. Other: None. IMPRESSION: No acute intracranial abnormality. Electronically Signed   By: Lorenza Cambridge M.D.   On: 07/15/2022 14:27        Scheduled Meds:  enoxaparin (LOVENOX) injection  40 mg Subcutaneous Q24H   latanoprost  1 drop Both Eyes QHS   liver oil-zinc oxide   Topical Daily   losartan  25 mg Oral Daily   metoprolol tartrate  12.5 mg Oral BID   mometasone-formoterol  2 puff Inhalation BID   umeclidinium bromide  1 puff Inhalation Daily   Continuous Infusions:  cefTRIAXone (ROCEPHIN)  IV Stopped (07/16/22 1713)   potassium chloride       LOS: 2 days    Time spent: 35 minutes    Kyree Adriano Hoover Brunette, DO [facility]  If 7PM-7AM, please contact night-coverage [email or web address] 07/17/2022, 10:34 AM

## 2022-07-17 NOTE — Progress Notes (Signed)
Patient refused mdi 's she has so for several days.

## 2022-07-17 NOTE — TOC Initial Note (Signed)
Transition of Care [facility]) - Initial/Assessment Note    Patient Details  Name: Heidi Fuentes MRN: [id number] Date of Birth: 01/25/1946  Transition of Care [facility]) CM/SW Contact:    Annice Needy, LCSW Phone Number: 07/17/2022, 10:19 AM  Clinical Narrative:                 Patient from home alone. Admitted for severe sepsis. Completely independent with ambulation and ADLs at baseline, drives. PT recommends SNF. Son, [name] will speak with brother about rehab. Historically, patient has declined rehab but has accepted HH. He feels that she will most likely decline rehab presently because she does not want to leave her home. Patient is disoriented x4 currently.   Expected Discharge Plan: Home w Home Health Services Barriers to Discharge: Continued Medical Work up   Patient Goals and CMS Choice            Expected Discharge Plan and Services       Living arrangements for the past 2 months: Single Family Home                                      Prior Living Arrangements/Services Living arrangements for the past 2 months: Single Family Home Lives with:: Self                   Activities of Daily Living Home Assistive Devices/Equipment: Cane (specify quad or straight) ADL Screening (condition at time of admission) Patient's cognitive ability adequate to safely complete daily activities?: No Is the patient deaf or have difficulty hearing?: Yes Does the patient have difficulty seeing, even when wearing glasses/contacts?: Yes Does the patient have difficulty concentrating, remembering, or making decisions?: Yes Patient able to express need for assistance with ADLs?: Yes Does the patient have difficulty dressing or bathing?: No Independently performs ADLs?: Yes (appropriate for developmental age) Does the patient have difficulty walking or climbing stairs?: No Weakness of Legs: None Weakness of Arms/Hands: None  Permission Sought/Granted Permission sought to  share information with : Family Supports    Share Information with NAME: son, [name]           Emotional Assessment         Alcohol / Substance Use: Not Applicable Psych Involvement: No (comment)  Admission diagnosis:  Severe sepsis (HCC) [A41.9, R65.20] Patient Active Problem List   Diagnosis Date Noted   Severe sepsis (HCC) 07/15/2022   UTI (urinary tract infection) 07/15/2022   Acute metabolic encephalopathy 07/15/2022   History of adenomatous polyp of colon 05/12/2021   Bipolar affective disorder, depressed, mild (HCC) 08/07/2020   Rhabdomyolysis 08/03/2020   Elevated LFTs 08/03/2020   Unilateral primary osteoarthritis, left hip 03/26/2020   Pedal edema 03/18/2020   Pain in joint involving ankle and foot 12/20/2019   Bronchiectasis without complication (HCC) 10/31/2019   DOE (dyspnea on exertion) 07/27/2019   Dysphagia 07/25/2018   Leukocytosis 07/25/2018   Closed fracture of left orbital floor (HCC) 07/25/2018   Exudative age-related macular degeneration, bilateral, with active choroidal neovascularization (HCC) 07/25/2018   CAP (community acquired pneumonia) due to MRSA (methicillin resistant Staphylococcus aureus) (HCC) 02/13/2018   Asthma, chronic, unspecified asthma severity, with acute exacerbation 02/04/2018   Cough 11/02/2017   Gastroesophageal reflux disease without esophagitis 10/30/2017   Other specified glaucoma 10/30/2017   Uncomplicated asthma 10/30/2017   Chronic rhinitis 10/30/2017   Long-term current use of opiate analgesic  03/10/2017   Essential hypertension 02/17/2017   On long term drug therapy 02/08/2017   Chronic narcotic use 02/04/2017   Hiatal hernia 02/03/2017   Hypokalemia 09/06/2015   Prolonged Q-T interval on ECG 09/06/2015   Fall    Iron deficiency anemia, unspecified 12/15/2013   Schatzki's ring 08/17/2013   Anemia 08/17/2013   Encounter for screening colonoscopy 08/17/2013   Constipation 07/10/2013   Chronic pain syndrome  07/10/2013   Other specified hypothyroidism 07/09/2013   Colitis 07/09/2013   Asthma 07/09/2013   PCP:  Tanna Furry, MD Pharmacy:   [facility] [address] [place] - [address] [place] [address] Phone: [phone] Fax: [phone]  [facility] - [place], [place] - [address] [place] [address] Phone: [phone] Fax: [phone]  [facility] - [place], [place] - [address] [place] [address] Phone: [phone] Fax: [phone]     Social Determinants of Health (SDOH) Social History: SDOH Screenings   Food Insecurity: No Food Insecurity (07/15/2022)  Housing: Low Risk  (07/15/2022)  Transportation Needs: No Transportation Needs (07/15/2022)  Utilities: Not At Risk (07/15/2022)  Tobacco Use: Low Risk  (07/15/2022)   SDOH Interventions:     Readmission Risk Interventions     No data to display

## 2022-07-17 NOTE — Plan of Care (Signed)
  Problem: Acute Rehab PT Goals(only PT should resolve) Goal: Pt Will Go Supine/Side To Sit Outcome: Progressing Flowsheets (Taken 07/17/2022 1414) Pt will go Supine/Side to Sit:  with minimal assist  with moderate assist Goal: Patient Will Transfer Sit To/From Stand Outcome: Progressing Flowsheets (Taken 07/17/2022 1414) Patient will transfer sit to/from stand:  with minimal assist  with moderate assist Goal: Pt Will Transfer Bed To Chair/Chair To Bed Outcome: Progressing Flowsheets (Taken 07/17/2022 1414) Pt will Transfer Bed to Chair/Chair to Bed:  with min assist  with mod assist Goal: Pt Will Ambulate Outcome: Progressing Flowsheets (Taken 07/17/2022 1414) Pt will Ambulate:  25 feet  with minimal assist  with moderate assist  with rolling walker   2:14 PM, 07/17/22 Ocie Bob, MPT Physical Therapist with [facility] [phone] office [phone] mobile phone

## 2022-07-17 NOTE — Evaluation (Signed)
Physical Therapy Evaluation Patient Details Name: Heidi Fuentes MRN: [id number] DOB: 1945-08-27 Today's Date: 07/17/2022  History of Present Illness  Heidi Fuentes is a 77 y.o. female with medical history significant for asthma, bipolar disorder, chronic pain, hypertension, bronchiectasis.  Patient was brought to the ED via EMS reports of altered mental status.  Patient was last known normal on Saturday.  Her son called this morning but she was not answering normally, at baseline patient has slight confusion.  Son reports that patient was talking to the clock, talking to her hand, and did not recognize him.   At the time of my evaluation, patient is slightly somnolent, with eyes closed, but responding to some some directions, splinting appropriately to 1 question only.   Clinical Impression  Patient presents confused, non-verbal and requires constant verbal/tactile cueing for participating with therapy.  Patient demonstrates slow labored movement for sitting up at bedside, very unsteady on feet with frequent buckling of knees due to weakness, able to a few steps to transfer to chair with Max assist, once seated in chair tends to slide forward and put back to bed for safety - nurse aware.  Patient will benefit from continued skilled physical therapy in hospital and recommended venue below to increase strength, balance, endurance for safe ADLs and gait.       Recommendations for follow up therapy are one component of a multi-disciplinary discharge planning process, led by the attending physician.  Recommendations may be updated based on patient status, additional functional criteria and insurance authorization.  Follow Up Recommendations Can patient physically be transported by private vehicle: No     Assistance Recommended at Discharge Frequent or constant Supervision/Assistance  Patient can return home with the following  A lot of help with bathing/dressing/bathroom;A lot of help with walking  and/or transfers;Help with stairs or ramp for entrance;Assistance with cooking/housework    Equipment Recommendations None recommended by PT  Recommendations for Other Services       Functional Status Assessment Patient has had a recent decline in their functional status and demonstrates the ability to make significant improvements in function in a reasonable and predictable amount of time.     Precautions / Restrictions Precautions Precautions: Fall Restrictions Weight Bearing Restrictions: No      Mobility  Bed Mobility Overal bed mobility: Needs Assistance Bed Mobility: Supine to Sit, Sit to Supine     Supine to sit: Mod assist Sit to supine: Mod assist, Max assist   General bed mobility comments: slow labored movement    Transfers Overall transfer level: Needs assistance Equipment used: Rolling walker (2 wheels) Transfers: Sit to/from Stand, Bed to chair/wheelchair/BSC Sit to Stand: Mod assist   Step pivot transfers: Mod assist, Max assist       General transfer comment: frequent buckling of knees due to weakness    Ambulation/Gait Ambulation/Gait assistance: Max assist Gait Distance (Feet): 3 Feet Assistive device: Rolling walker (2 wheels) Gait Pattern/deviations: Decreased step length - right, Decreased step length - left, Decreased stride length, Knees buckling Gait velocity: slow     General Gait Details: limited to a few steps at bedside due to BLE weakness with buckling of knees  Stairs            Wheelchair Mobility    Modified Rankin (Stroke Patients Only)       Balance Overall balance assessment: Needs assistance Sitting-balance support: Feet supported Sitting balance-Leahy Scale: Fair Sitting balance - Comments: fair/poor seated at EOB   Standing  balance support: During functional activity, Reliant on assistive device for balance, Bilateral upper extremity supported Standing balance-Leahy Scale: Poor Standing balance comment:  using RW                             Pertinent Vitals/Pain Pain Assessment Pain Assessment: Faces Faces Pain Scale: No hurt    Home Living Family/patient expects to be discharged to:: Private residence Living Arrangements: Alone Available Help at Discharge: Family;Available PRN/intermittently Type of Home: House Home Access: Ramped entrance       Home Layout: One level Home Equipment: Agricultural consultant (2 wheels);Cane - single point;Wheelchair - manual Additional Comments: patient is poor historian - info from previous admission    Prior Function Prior Level of Function : Independent/Modified Independent             Mobility Comments: household and short distanced community ambulator using [facility] ADLs Comments: Independent     Hand Dominance   Dominant Hand: Right    Extremity/Trunk Assessment   Upper Extremity Assessment Upper Extremity Assessment: Generalized weakness    Lower Extremity Assessment Lower Extremity Assessment: Generalized weakness    Cervical / Trunk Assessment Cervical / Trunk Assessment: Kyphotic  Communication   Communication: Other (comment) (Patient non-verbal)  Cognition Arousal/Alertness: Awake/alert Behavior During Therapy: Flat affect Overall Cognitive Status: No family/caregiver present to determine baseline cognitive functioning                                          General Comments      Exercises     Assessment/Plan    PT Assessment Patient needs continued PT services  PT Problem List Decreased strength;Decreased activity tolerance;Decreased balance;Decreased mobility       PT Treatment Interventions DME instruction;Gait training;Stair training;Functional mobility training;Therapeutic activities;Therapeutic exercise;Patient/family education;Balance training    PT Goals (Current goals can be found in the Care Plan section)  Acute Rehab PT Goals Patient Stated Goal: not stated PT Goal  Formulation: With patient Time For Goal Achievement: 07/31/22 Potential to Achieve Goals: Good    Frequency Min 3X/week     Co-evaluation               AM-PAC PT "6 Clicks" Mobility  Outcome Measure Help needed turning from your back to your side while in a flat bed without using bedrails?: A Little Help needed moving from lying on your back to sitting on the side of a flat bed without using bedrails?: A Lot Help needed moving to and from a bed to a chair (including a wheelchair)?: A Lot Help needed standing up from a chair using your arms (e.g., wheelchair or bedside chair)?: A Lot Help needed to walk in hospital room?: A Lot Help needed climbing 3-5 steps with a railing? : Total 6 Click Score: 12    End of Session   Activity Tolerance: Patient tolerated treatment well;Patient limited by fatigue Patient left: in chair;with call bell/phone within reach Nurse Communication: Mobility status PT Visit Diagnosis: Unsteadiness on feet (R26.81);Other abnormalities of gait and mobility (R26.89);Muscle weakness (generalized) (M62.81)    Time: 8657-8469 PT Time Calculation (min) (ACUTE ONLY): 21 min   Charges:   PT Evaluation $PT Eval Moderate Complexity: 1 Mod PT Treatments $Therapeutic Activity: 8-22 mins        2:13 PM, 07/17/22 Ocie Bob, MPT Physical Therapist with [facility]  [facility] [phone] office [phone] mobile phone

## 2022-07-17 NOTE — Progress Notes (Signed)
Patient refused all morning medications after several attempts, attempted to give medications in ice cream. Patient pushed medication away and covered mouth. MD Sherryll Burger made aware.

## 2022-07-18 DIAGNOSIS — R6520 Severe sepsis without septic shock: Secondary | ICD-10-CM | POA: Diagnosis not present

## 2022-07-18 DIAGNOSIS — A419 Sepsis, unspecified organism: Secondary | ICD-10-CM | POA: Diagnosis not present

## 2022-07-18 LAB — CBC
HCT: 47.6 % — ABNORMAL HIGH (ref 36.0–46.0)
Hemoglobin: 15.6 g/dL — ABNORMAL HIGH (ref 12.0–15.0)
MCH: 31.6 pg (ref 26.0–34.0)
MCHC: 32.8 g/dL (ref 30.0–36.0)
MCV: 96.4 fL (ref 80.0–100.0)
Platelets: 275 10*3/uL (ref 150–400)
RBC: 4.94 MIL/uL (ref 3.87–5.11)
RDW: 13.2 % (ref 11.5–15.5)
WBC: 13 10*3/uL — ABNORMAL HIGH (ref 4.0–10.5)
nRBC: 0 % (ref 0.0–0.2)

## 2022-07-18 LAB — BASIC METABOLIC PANEL
Anion gap: 16 — ABNORMAL HIGH (ref 5–15)
BUN: 19 mg/dL (ref 8–23)
CO2: 19 mmol/L — ABNORMAL LOW (ref 22–32)
Calcium: 9.5 mg/dL (ref 8.9–10.3)
Chloride: 104 mmol/L (ref 98–111)
Creatinine, Ser: 0.61 mg/dL (ref 0.44–1.00)
GFR, Estimated: >60 mL/min (ref >60)
Glucose, Bld: 87 mg/dL (ref 70–99)
Potassium: 3.5 mmol/L (ref 3.5–5.1)
Sodium: 139 mmol/L (ref 135–145)

## 2022-07-18 LAB — CULTURE, BLOOD (ROUTINE X 2): Culture: NO GROWTH

## 2022-07-18 LAB — MAGNESIUM: Magnesium: 2.2 mg/dL (ref 1.7–2.4)

## 2022-07-18 MED ORDER — LEVOTHYROXINE SODIUM 50 MCG PO TABS
50.0000 ug | ORAL_TABLET | Freq: Every day | ORAL | Status: DC
  Administered 2022-07-18 – 2022-07-28 (×6): 50 ug via ORAL
  Filled 2022-07-18 (×6): qty 1
  Filled 2022-07-18: qty 2
  Filled 2022-07-18: qty 1

## 2022-07-18 MED ORDER — GABAPENTIN 300 MG PO CAPS
300.0000 mg | ORAL_CAPSULE | Freq: Every day | ORAL | Status: DC
  Administered 2022-07-18 – 2022-07-27 (×7): 300 mg via ORAL
  Filled 2022-07-18 (×9): qty 1

## 2022-07-18 MED ORDER — BUPROPION HCL 75 MG PO TABS
75.0000 mg | ORAL_TABLET | Freq: Two times a day (BID) | ORAL | Status: DC
  Administered 2022-07-18 – 2022-07-19 (×3): 75 mg via ORAL
  Filled 2022-07-18 (×5): qty 1

## 2022-07-18 MED ORDER — FLUOXETINE HCL 20 MG PO CAPS
40.0000 mg | ORAL_CAPSULE | Freq: Every day | ORAL | Status: DC
  Administered 2022-07-18 – 2022-07-19 (×2): 40 mg via ORAL
  Filled 2022-07-18 (×2): qty 2

## 2022-07-18 MED ORDER — PANTOPRAZOLE SODIUM 40 MG PO TBEC
40.0000 mg | DELAYED_RELEASE_TABLET | Freq: Every day | ORAL | Status: DC
  Administered 2022-07-18 – 2022-07-28 (×8): 40 mg via ORAL
  Filled 2022-07-18 (×9): qty 1

## 2022-07-18 MED ORDER — MONTELUKAST SODIUM 10 MG PO TABS
10.0000 mg | ORAL_TABLET | Freq: Every day | ORAL | Status: DC
  Administered 2022-07-18 – 2022-07-28 (×8): 10 mg via ORAL
  Filled 2022-07-18 (×9): qty 1

## 2022-07-18 MED ORDER — LAMOTRIGINE 100 MG PO TABS
200.0000 mg | ORAL_TABLET | Freq: Two times a day (BID) | ORAL | Status: DC
  Administered 2022-07-18 – 2022-07-28 (×15): 200 mg via ORAL
  Filled 2022-07-18 (×19): qty 2

## 2022-07-18 NOTE — TOC Transition Note (Addendum)
Transition of Care [facility]) - CM/SW Discharge Note   Patient Details  Name: Heidi Fuentes MRN: [id number] Date of Birth: 31-Oct-1945  Transition of Care [facility]) CM/SW Contact:  Catalina Gravel, LCSW Phone Number: 07/18/2022, 9:00 AM   Clinical Narrative:    CSW spoke with pt son.  Mr. Jenell Ravitz wanted to share that he and family not in agreement with SNF as they feel pt will just lay there, and not accept services.  Mr. Larose also stated he feels pt should not return home alone as she needs to get her "mind and psych medication" right first.  Per son, pt has had previous inpatient psychiatric hospitalizations and that is what she may need now.  CSW did try to clarify, should mom be medically ready but not meet a standard for inpatient psych what would the DC preferred plan be, so we can do concurrent planning. Son stated he is of the opinion Dr. Should pursue the psych eval/inpt route as the only option as this is what he feels is needed.  CSW agreed to convey wishes to DR. And request that he contact son to discuss this concern.  CSW sent a secure chat to the DR. With some detail requesting call to pt. Son. CSW agreed to contact pt after 10:30 and stated if he has not spoken to Dr., CSW may have an update at that time. TOC to follow.   Addendum: MD spoke to son- will request Psych involvement, eval- if pt not recommended for inpatient MD reports family will reconsider SNF placement.    Barriers to Discharge: Continued Medical Work up   Patient Goals and CMS Choice      Discharge Placement                         Discharge Plan and Services Additional resources added to the After Visit Summary for                                       Social Determinants of Health (SDOH) Interventions SDOH Screenings   Food Insecurity: No Food Insecurity (07/15/2022)  Housing: Low Risk  (07/15/2022)  Transportation Needs: No Transportation Needs (07/15/2022)  Utilities: Not At Risk  (07/15/2022)  Tobacco Use: Low Risk  (07/15/2022)     Readmission Risk Interventions     No data to display

## 2022-07-18 NOTE — Progress Notes (Signed)
PROGRESS NOTE    SAMARRA TOGUCHI  ZOX:[id number] DOB: May 15, 1945 DOA: 07/15/2022 PCP: Tanna Furry, MD   Brief Narrative:    Heidi Fuentes is a 77 y.o. female with medical history significant for asthma, bipolar disorder, chronic pain, hypertension, bronchiectasis. Patient was brought to the ED via EMS reports of altered mental status.  Patient was admitted for acute metabolic encephalopathy in the setting of severe sepsis likely due to UTI.  Assessment & Plan:   Principal Problem:   Severe sepsis (HCC) Active Problems:   UTI (urinary tract infection)   Acute metabolic encephalopathy   Essential hypertension  Assessment and Plan:   Severe sepsis (HCC) Severe sepsis likely due to urinary tract infection.  Meeting severe sepsis criteria with tachycardia, heart rate 118-129, with leukocytosis of 16.1, and evidence of endorgan dysfunction- encephalopathy.  Lactic acid 1.2.  No recent urine cultures on file -Blood cultures with no growth noted thus far -Urine cultures with mult. sp -1 L bolus, continue N/s 75cc/hr x 1 day -IV ceftriaxone completed after 3 days   Acute metabolic encephalopathy-improved Presenting with confused speech, on my evaluation patient is somnolent, following some directions.  CT negative for acute abnormality.  Encephalopathy likely secondary to severe sepsis, UTI, dehydration -Check chest x-ray -IV antibiotics, IV fluids -TSH 0.581 -Family unable to confirm medications that patient takes -Resume all home medications -PT/OT evaluation with recommendations for SNF on discharge   Essential hypertension Systolic 130s to 811B. -Hold HCTZ to allow for hydration, -Resume metoprolol, losartan -Refusing home medications at this time    DVT prophylaxis:Lovenox Code Status: Full Family Communication: Discussed with son on phone 6/15 Disposition Plan:  Status is: Inpatient Remains inpatient appropriate because: Need for IV  medications   Consultants:  None  Procedures:  None  Antimicrobials:  Anti-infectives (From admission, onward)    Start     Dose/Rate Route Frequency Ordered Stop   07/15/22 1630  cefTRIAXone (ROCEPHIN) 1 g in sodium chloride 0.9 % 100 mL IVPB  Status:  Discontinued        1 g 200 mL/hr over 30 Minutes Intravenous Every 24 hours 07/15/22 1617 07/18/22 0725       Subjective: Patient seen and evaluated today and is more conversational and less confused.  Taking her oral medications today.  Objective: Vitals:   07/18/22 0136 07/18/22 0552 07/18/22 0918 07/18/22 1217  BP: 137/88 (!) 156/84  (!) 117/53  Pulse: 83 79  81  Resp: 18 16  16   Temp: 98.8 F (37.1 C) 97.8 F (36.6 C)  99.2 F (37.3 C)  TempSrc: Oral Oral  Axillary  SpO2:   96% 96%  Weight:      Height:        Intake/Output Summary (Last 24 hours) at 07/18/2022 1222 Last data filed at 07/17/2022 1700 Gross per 24 hour  Intake 303.05 ml  Output --  Net 303.05 ml   Filed Weights   07/15/22 1130  Weight: 64 kg    Examination:  General exam: Appears alert and responds to questions appropriately Respiratory system: Clear to auscultation. Respiratory effort normal. Cardiovascular system: S1 & S2 heard, RRR.  Gastrointestinal system: Abdomen is soft Central nervous system: Alert and awake Extremities: No edema Skin: No significant lesions noted Psychiatry: Flat affect.    Data Reviewed: I have personally reviewed following labs and imaging studies  CBC: Recent Labs  Lab 07/15/22 1240 07/16/22 0418 07/17/22 0435 07/18/22 0323  WBC 16.1* 11.7* 13.6* 13.0*  NEUTROABS  13.2*  --   --   --   HGB 15.8* 13.4 15.6* 15.6*  HCT 47.2* 41.1 47.1* 47.6*  MCV 94.6 96.0 97.1 96.4  PLT 329 269 260 275   Basic Metabolic Panel: Recent Labs  Lab 07/15/22 1240 07/16/22 0418 07/17/22 0435 07/18/22 0323  NA 137 139 140 139  K 3.6 3.3* 3.4* 3.5  CL 100 106 102 104  CO2 18* 19* 21* 19*  GLUCOSE 109* 85 82  87  BUN 29* 20 14 19   CREATININE 0.81 0.66 0.53 0.61  CALCIUM 9.9 8.9 9.6 9.5  MG  --   --  2.2 2.2   GFR: Estimated Creatinine Clearance: 49.2 mL/min (by C-G formula based on SCr of 0.61 mg/dL). Liver Function Tests: Recent Labs  Lab 07/15/22 1240  AST 46*  ALT 37  ALKPHOS 66  BILITOT 1.5*  PROT 7.9  ALBUMIN 4.6   No results for input(s): "LIPASE", "AMYLASE" in the last 168 hours. Recent Labs  Lab 07/15/22 1240  AMMONIA <10   Coagulation Profile: No results for input(s): "INR", "PROTIME" in the last 168 hours. Cardiac Enzymes: No results for input(s): "CKTOTAL", "CKMB", "CKMBINDEX", "TROPONINI" in the last 168 hours. BNP (last 3 results) No results for input(s): "PROBNP" in the last 8760 hours. HbA1C: No results for input(s): "HGBA1C" in the last 72 hours. CBG: Recent Labs  Lab 07/15/22 1245  GLUCAP 107*   Lipid Profile: No results for input(s): "CHOL", "HDL", "LDLCALC", "TRIG", "CHOLHDL", "LDLDIRECT" in the last 72 hours. Thyroid Function Tests: Recent Labs    07/15/22 1240  TSH 0.581   Anemia Panel: No results for input(s): "VITAMINB12", "FOLATE", "FERRITIN", "TIBC", "IRON", "RETICCTPCT" in the last 72 hours. Sepsis Labs: Recent Labs  Lab 07/15/22 1629  LATICACIDVEN 1.2    Recent Results (from the past 240 hour(s))  Urine Culture (for pregnant, neutropenic or urologic patients or patients with an indwelling urinary catheter)     Status: Abnormal   Collection Time: 07/15/22  4:18 PM   Specimen: Urine, Clean Catch  Result Value Ref Range Status   Specimen Description   Final    URINE, CLEAN CATCH Performed at [facility], [address] [place], [place] [address]    Special Requests   Final    NONE Performed at [facility], [address], [place], [place] [address]    Culture MULTIPLE SPECIES PRESENT, SUGGEST RECOLLECTION (A)  Final   Report Status 07/16/2022 FINAL  Final  Culture, blood (Routine X 2) w Reflex to ID Panel     Status: None  (Preliminary result)   Collection Time: 07/15/22  4:29 PM   Specimen: BLOOD  Result Value Ref Range Status   Specimen Description BLOOD BLOOD RIGHT ARM  Final   Special Requests   Final    BOTTLES DRAWN AEROBIC AND ANAEROBIC Blood Culture adequate volume   Culture   Final    NO GROWTH 3 DAYS Performed at [facility], [address] [place], [place] [address]    Report Status PENDING  Incomplete  Culture, blood (Routine X 2) w Reflex to ID Panel     Status: None (Preliminary result)   Collection Time: 07/15/22  4:38 PM   Specimen: Site Not Specified; Blood  Result Value Ref Range Status   Specimen Description SITE NOT SPECIFIED  Final   Special Requests   Final    BOTTLES DRAWN AEROBIC AND ANAEROBIC Blood Culture adequate volume   Culture   Final    NO GROWTH  3 DAYS Performed at [facility], [address], [place], [place] [address]    Report Status PENDING  Incomplete         Radiology Studies: No results found.      Scheduled Meds:  buPROPion  75 mg Oral BID   enoxaparin (LOVENOX) injection  40 mg Subcutaneous Q24H   FLUoxetine  40 mg Oral Daily   gabapentin  300 mg Oral QHS   lamoTRIgine  200 mg Oral BID   latanoprost  1 drop Both Eyes QHS   levothyroxine  50 mcg Oral Daily   liver oil-zinc oxide   Topical Daily   losartan  25 mg Oral Daily   metoprolol tartrate  12.5 mg Oral BID   mometasone-formoterol  2 puff Inhalation BID   montelukast  10 mg Oral Daily   pantoprazole  40 mg Oral Daily   umeclidinium bromide  1 puff Inhalation Daily    LOS: 3 days    Time spent: 35 minutes    Keyon Winnick Hoover Brunette, DO [facility]  If 7PM-7AM, please contact night-coverage [email or web address] 07/18/2022, 12:22 PM

## 2022-07-19 DIAGNOSIS — A419 Sepsis, unspecified organism: Secondary | ICD-10-CM | POA: Diagnosis not present

## 2022-07-19 DIAGNOSIS — R6520 Severe sepsis without septic shock: Secondary | ICD-10-CM | POA: Diagnosis not present

## 2022-07-19 LAB — BASIC METABOLIC PANEL
Anion gap: 10 (ref 5–15)
BUN: 30 mg/dL — ABNORMAL HIGH (ref 8–23)
CO2: 27 mmol/L (ref 22–32)
Calcium: 9.6 mg/dL (ref 8.9–10.3)
Chloride: 103 mmol/L (ref 98–111)
Creatinine, Ser: 0.66 mg/dL (ref 0.44–1.00)
GFR, Estimated: >60 mL/min (ref >60)
Glucose, Bld: 97 mg/dL (ref 70–99)
Potassium: 3.7 mmol/L (ref 3.5–5.1)
Sodium: 140 mmol/L (ref 135–145)

## 2022-07-19 LAB — CBC
HCT: 44.8 % (ref 36.0–46.0)
Hemoglobin: 14.9 g/dL (ref 12.0–15.0)
MCH: 31.5 pg (ref 26.0–34.0)
MCHC: 33.3 g/dL (ref 30.0–36.0)
MCV: 94.7 fL (ref 80.0–100.0)
Platelets: 251 10*3/uL (ref 150–400)
RBC: 4.73 MIL/uL (ref 3.87–5.11)
RDW: 13.1 % (ref 11.5–15.5)
WBC: 12.5 10*3/uL — ABNORMAL HIGH (ref 4.0–10.5)
nRBC: 0 % (ref 0.0–0.2)

## 2022-07-19 LAB — MAGNESIUM: Magnesium: 2.2 mg/dL (ref 1.7–2.4)

## 2022-07-19 MED ORDER — UMECLIDINIUM BROMIDE 62.5 MCG/ACT IN AEPB: 1.0000 | INHALATION_SPRAY | Freq: Every day | RESPIRATORY_TRACT | Status: DC | PRN

## 2022-07-19 MED ORDER — RISPERIDONE 1 MG PO TABS: 1.0000 mg | ORAL_TABLET | Freq: Two times a day (BID) | ORAL | Status: DC

## 2022-07-19 MED ORDER — MOMETASONE FURO-FORMOTEROL FUM 200-5 MCG/ACT IN AERO: 2.0000 | INHALATION_SPRAY | Freq: Two times a day (BID) | RESPIRATORY_TRACT | Status: DC | PRN

## 2022-07-19 MED ORDER — FLUOXETINE HCL 20 MG PO CAPS: 20.0000 mg | ORAL_CAPSULE | Freq: Every day | ORAL | Status: DC

## 2022-07-19 MED ORDER — OLANZAPINE 5 MG PO TBDP
5.0000 mg | ORAL_TABLET | ORAL | Status: DC
  Filled 2022-07-19: qty 1

## 2022-07-19 NOTE — Consult Note (Cosign Needed Addendum)
Telepsych Consultation   Reason for Consult: Eval ration of bipolar disorder versus delirium has restarted multiple priors psychiatric hospitalizations.  Referring Physician:  Vonita Moss Location of Patient: A320.01 Location of Provider: Other: [facility] urgent care  Patient Identification: Heidi HOE MRN:  [id number] Principal Diagnosis: Severe sepsis [facility]) Diagnosis:  Principal Problem:   Severe sepsis (HCC) Active Problems:   Essential hypertension   UTI (urinary tract infection)   Acute metabolic encephalopathy   Total Time spent with patient: 15 minutes  Subjective:   Heidi Fuentes is a 77 y.o. female was seen and evaluated via telehealth assessment by this provider and TTS clinician D.Umberger.  Patient presents tangential, disorganized and pressured. Patient is a poor historian. She appears oriented to self only.  spoke to patient's son Florentine Leister for additional collateral.  He states he is unsure if his mother has been taking her medications as indicated.  States she was recently seen and evaluated by a new provider who discontinued some of her medications however he is unsure which medications is continued.  States his older brother Homero Fellers his mother 2 days ago " talking to clock."  Was reported that patient diagnosed with a urinary tract infection.  As she presented with acute metabolic encephalopathy.   Chart review patient is currently prescribed Lamictal 200 mg, gabapentin 300 mg nightly,  Prozac 40 mg daily and Wellbutrin 75 mg p.o. twice daily. Plan:Consulted with attending psychiatrist MD Goli. Recommending holding Prozac 40 mg and Wellbutrin x 2 days.  Will initiate Zyprexa 5 mg daily, with a now dose.  (Will consider titration).  Lamictal level ordered-results pending  -This provider spoke to patient's son Loistine Chance education provided with blackbox warning related to antipsychotics in elderly patients.  He is receptive to starting medications at this  time.  HPI: Per initial admission assessment note on the brief narrative: "FRAN HECKARD is a 77 y.o. female with medical history significant for asthma, bipolar disorder, chronic pain, hypertension, bronchiectasis.Patient was brought to the ED via EMS reports of altered mental status.  Patient was admitted for acute metabolic encephalopathy in the setting of severe sepsis likely due to UTI.  UTI has now resolved, but she continues to have some delirium versus psychiatric issues that are persisting.  She will require psychiatric evaluation."  Past Psychiatric History: Charted history with schizoaffective disorder, schizophrenia major depressive disorder and generalized anxiety disorder.  Currently she is prescribed Wellbutrin, Lamictal, Prozac and gabapentin for mood stabilization.   Risk to Self:   Risk to Others:   Prior Inpatient Therapy:   Prior Outpatient Therapy:    Past Medical History:  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(HCC)     Past Surgical History:  Procedure Laterality Date   CATARACT EXTRACTION W/PHACO Right  10/06/2016   Procedure: CATARACT EXTRACTION PHACO AND INTRAOCULAR LENS PLACEMENT (IOC);  Surgeon: Galen Manila, MD;  Location: [facility] ORS;  Service: Ophthalmology;  Laterality: Right;  [place] 00:32.5 AP% 14.5 CDE 4.71 Fluid pack lot # [id number]   CATARACT EXTRACTION W/PHACO Left 11/03/2016   Procedure: CATARACT EXTRACTION PHACO AND INTRAOCULAR  LENS PLACEMENT (IOC);  Surgeon: Galen Manila, MD;  Location: [facility] ORS;  Service: Ophthalmology;  Laterality: Left;  [place] 00:36.6 AP% 16.0 CDE 5.86 Fluid Pack lot # [id number]   COCCYX REMOVAL     colonoscopy  2005   Dr. Karilyn Cota: mild melanosis coli, otherwise normal   COLONOSCOPY WITH PROPOFOL N/A 09/14/2013   AOZ:HYQMVHQIO coli. Colonic diverticulosis. Single colonic. Tubular adenoma. Next TCS 09/2020.   COLONOSCOPY WITH PROPOFOL N/A 07/24/2021   Procedure: COLONOSCOPY WITH PROPOFOL;  Surgeon: Corbin Ade, MD;  [facility];  Service: Endoscopy;  Laterality: N/A;  2:00pm   ERCP  1997   [facility]: biliary manometry abnormal, subsequent sphincterotomy    ESOPHAGEAL MANOMETRY N/A 12/21/2016   Surgeon: Napoleon Form, MD; EG junction outflow obstruction which could be secondary to large hiatal hernia and possible coiling of catheter.  Not consistent with achalasia or other variant.  No major peristaltic motility disorder.   ESOPHAGOGASTRODUODENOSCOPY (EGD) WITH PROPOFOL N/A 09/14/2013   NGE:[id number]'G ring. Hiatal hernia. Status post Elease Hashimoto and biopsy disruption.    FOOT SURGERY     X2   HEMORROIDECTOMY     HERNIA REPAIR     inguinal right   HERNIA REPAIR     LAPAROSCOPIC PARAESOPHAGEAL HERNIA REPAIR  02/2017   Dr. Carolynn Sayers [facility])   [place] DILATION N/A 09/14/2013   Procedure: Alvy Beal;  Surgeon: Corbin Ade, MD;  Location: [facility];  Service: Endoscopy;  Laterality: N/A;  56   POLYPECTOMY N/A 09/14/2013   Procedure: POLYPECTOMY;  Surgeon: Corbin Ade, MD;  Location: [facility];  Service: Endoscopy;  Laterality: N/A;   POLYPECTOMY  07/24/2021   Procedure: POLYPECTOMY;  Surgeon: Corbin Ade, MD;  [facility];  Service: Endoscopy;;   TONSILLECTOMY     Family History:  Family History  Problem Relation Age of Onset   Lung cancer Mother    Heart attack Father    Bone cancer Brother    COPD Sister    Colon cancer Neg Hx    Family  Psychiatric  History:    Social History:  Social History   Substance and Sexual Activity  Alcohol Use No     Social History   Substance and Sexual Activity  Drug Use No    Social History   Socioeconomic History   Marital status: Divorced    Spouse name: Not on file   Number of children: Not on file   Years of education: Not on file   Highest education level: Not on file  Occupational History   Not on file  Tobacco Use   Smoking status: Never   Smokeless tobacco: Never   Tobacco comments:    NEVER SMOKED  Vaping Use   Vaping Use: Never used  Substance and Sexual Activity   Alcohol use: No   Drug use: No   Sexual activity: Not on file  Other Topics Concern   Not on file  Social History Narrative   Not on file   Social Determinants of Health   Financial Resource Strain: Not on file  Food Insecurity: No Food Insecurity (07/15/2022)   Hunger Vital Sign  Worried About Programme researcher, broadcasting/film/video in the Last Year: Never true    Ran Out of Food in the Last Year: Never true  Transportation Needs: No Transportation Needs (07/15/2022)   PRAPARE - Administrator, Civil Service (Medical): No    Lack of Transportation (Non-Medical): No  Physical Activity: Not on file  Stress: Not on file  Social Connections: Not on file   Additional Social History:    Allergies:   Allergies  Allergen Reactions   Abilify [Aripiprazole] Other (See Comments)    Bad dreams   Geodon [Ziprasidone Hcl] Other (See Comments)    hospitalization specifics   Quetiapine Palpitations   Sulfa Antibiotics Rash   Trazodone And Nefazodone Other (See Comments)    Reaction is unknown    Labs:  Results for orders placed or performed during the hospital encounter of 07/15/22 (from the past 48 hour(s))  Basic metabolic panel     Status: Abnormal   Collection Time: 07/18/22  3:23 AM  Result Value Ref Range   Sodium 139 135 - 145 mmol/L   Potassium 3.5 3.5 - 5.1 mmol/L   Chloride 104 98 -  111 mmol/L   CO2 19 (L) 22 - 32 mmol/L   Glucose, Bld 87 70 - 99 mg/dL    Comment: Glucose reference range applies only to samples taken after fasting for at least 8 hours.   BUN 19 8 - 23 mg/dL   Creatinine, Ser 1.61 0.44 - 1.00 mg/dL   Calcium 9.5 8.9 - 09.6 mg/dL   GFR, Estimated >04 >54 mL/min    Comment: (NOTE) Calculated using the CKD-EPI Creatinine Equation (2021)    Anion gap 16 (H) 5 - 15    Comment: Performed at [facility], [address] [place], [place] [address]  Magnesium     Status: None   Collection Time: 07/18/22  3:23 AM  Result Value Ref Range   Magnesium 2.2 1.7 - 2.4 mg/dL    Comment: Performed at [facility], [address], [place], [place] [address]  CBC     Status: Abnormal   Collection Time: 07/18/22  3:23 AM  Result Value Ref Range   WBC 13.0 (H) 4.0 - 10.5 K/uL   RBC 4.94 3.87 - 5.11 MIL/uL   Hemoglobin 15.6 (H) 12.0 - 15.0 g/dL   HCT 29.5 (H) 62.1 - 30.8 %   MCV 96.4 80.0 - 100.0 fL   MCH 31.6 26.0 - 34.0 pg   MCHC 32.8 30.0 - 36.0 g/dL   RDW 65.7 84.6 - 96.2 %   Platelets 275 150 - 400 K/uL   nRBC 0.0 0.0 - 0.2 %    Comment: Performed at [facility], [address], [place], [place] [address]  Basic metabolic panel     Status: Abnormal   Collection Time: 07/19/22  4:48 AM  Result Value Ref Range   Sodium 140 135 - 145 mmol/L   Potassium 3.7 3.5 - 5.1 mmol/L   Chloride 103 98 - 111 mmol/L   CO2 27 22 - 32 mmol/L   Glucose, Bld 97 70 - 99 mg/dL    Comment: Glucose reference range applies only to samples taken after fasting for at least 8 hours.   BUN 30 (H) 8 - 23 mg/dL   Creatinine, Ser 1.32 0.44 - 1.00 mg/dL   Calcium 9.6 8.9 - 44.0 mg/dL   GFR, Estimated >10 >27 mL/min    Comment: (NOTE) Calculated using the CKD-EPI Creatinine Equation (2021)  Anion gap 10 5 - 15    Comment: Performed at [facility], [address] [place], [place] [address]  Magnesium     Status: None   Collection Time: 07/19/22  4:48 AM  Result Value Ref Range    Magnesium 2.2 1.7 - 2.4 mg/dL    Comment: Performed at [facility], [address] [place], [place] [address]  CBC     Status: Abnormal   Collection Time: 07/19/22  4:48 AM  Result Value Ref Range   WBC 12.5 (H) 4.0 - 10.5 K/uL   RBC 4.73 3.87 - 5.11 MIL/uL   Hemoglobin 14.9 12.0 - 15.0 g/dL   HCT 09.8 11.9 - 14.7 %   MCV 94.7 80.0 - 100.0 fL   MCH 31.5 26.0 - 34.0 pg   MCHC 33.3 30.0 - 36.0 g/dL   RDW 82.9 56.2 - 13.0 %   Platelets 251 150 - 400 K/uL   nRBC 0.0 0.0 - 0.2 %    Comment: Performed at [facility], [address], [place], [place] [address]    Medications:  Current Facility-Administered Medications  Medication Dose Route Frequency Provider Last Rate Last Admin   acetaminophen (TYLENOL) tablet 650 mg  650 mg Oral Q6H PRN Emokpae, Ejiroghene E, MD       Or   acetaminophen (TYLENOL) suppository 650 mg  650 mg Rectal Q6H PRN Emokpae, Ejiroghene E, MD       albuterol (PROVENTIL) (2.5 MG/3ML) 0.083% nebulizer solution 2.5 mg  2.5 mg Nebulization Q6H PRN Emokpae, Ejiroghene E, MD       enoxaparin (LOVENOX) injection 40 mg  40 mg Subcutaneous Q24H Emokpae, Ejiroghene E, MD   40 mg at 07/18/22 2233   gabapentin (NEURONTIN) capsule 300 mg  300 mg Oral QHS Shah, Pratik D, DO   300 mg at 07/18/22 2231   hydrALAZINE (APRESOLINE) injection 10 mg  10 mg Intravenous Q4H PRN Maurilio Lovely D, DO   10 mg at 07/17/22 2202   lamoTRIgine (LAMICTAL) tablet 200 mg  200 mg Oral BID Sherryll Burger, Pratik D, DO   200 mg at 07/19/22 0902   latanoprost (XALATAN) 0.005 % ophthalmic solution 1 drop  1 drop Both Eyes QHS Emokpae, Ejiroghene E, MD   1 drop at 07/18/22 2233   levothyroxine (SYNTHROID) tablet 50 mcg  50 mcg Oral Daily Sherryll Burger, Pratik D, DO   50 mcg at 07/18/22 1359   liver oil-zinc oxide (DESITIN) 40 % ointment   Topical Daily Sherryll Burger, Pratik D, DO   Given at 07/19/22 0909   losartan (COZAAR) tablet 25 mg  25 mg Oral Daily Sherryll Burger, Pratik D, DO   25 mg at 07/19/22 4696   metoprolol tartrate (LOPRESSOR)  tablet 12.5 mg  12.5 mg Oral BID Maurilio Lovely D, DO   12.5 mg at 07/19/22 0902   mometasone-formoterol (DULERA) 200-5 MCG/ACT inhaler 2 puff  2 puff Inhalation BID PRN Sherryll Burger, Pratik D, DO       montelukast (SINGULAIR) tablet 10 mg  10 mg Oral Daily Sherryll Burger, Pratik D, DO   10 mg at 07/19/22 0902   ondansetron (ZOFRAN) tablet 4 mg  4 mg Oral Q8H PRN Emokpae, Ejiroghene E, MD       Or   ondansetron (ZOFRAN) injection 4 mg  4 mg Intravenous Q8H PRN Emokpae, Ejiroghene E, MD       pantoprazole (PROTONIX) EC tablet 40 mg  40 mg Oral Daily Sherryll Burger, Pratik D, DO   40 mg at 07/19/22 0901  umeclidinium bromide (INCRUSE ELLIPTA) 62.5 MCG/ACT 1 puff  1 puff Inhalation Daily PRN Erick Blinks, DO        Musculoskeletal:   Psychiatric Specialty Exam:  Presentation  General Appearance: Disheveled  Eye Contact:Minimal  Speech:Pressured  Speech Volume:Increased  Handedness:Right   Mood and Affect  Mood:Irritable; Anxious; Labile  Affect:Labile   Thought Process  Thought Processes:Disorganized  Descriptions of Associations:Tangential  Orientation:Partial  Thought Content:Scattered; Rumination  History of Schizophrenia/Schizoaffective disorder:No data recorded Duration of Psychotic Symptoms:No data recorded Hallucinations:Hallucinations: None  Ideas of Reference:None  Suicidal Thoughts:Suicidal Thoughts: No  Homicidal Thoughts:Homicidal Thoughts: No   Sensorium  Memory:No data recorded Judgment:Poor  Insight:Poor   Executive Functions  Concentration:Poor  Attention Span:Poor  Recall:Poor  Fund of Knowledge:Poor  Language:Poor   Psychomotor Activity  Psychomotor Activity:Psychomotor Activity: Normal   Assets  Assets:Social Support; Desire for Improvement   Sleep  Sleep:Sleep: Poor    Physical Exam: Physical Exam Vitals reviewed.  Psychiatric:        Mood and Affect: Mood normal.        Behavior: Behavior normal.    ROS Blood pressure (!) 150/79,  pulse 77, temperature 98.3 F (36.8 C), resp. rate 18, height 5' (1.524 m), weight 64 kg, SpO2 96 %. Body mass index is 27.56 kg/m.  Treatment Plan Summary: Daily contact with patient to assess and evaluate symptoms and progress in treatment and Medication management  Hold Wellbutrin 75 mg p.o. twice daily and Prozac 40 mg daily Initiated Zyprexa 5 mg -QTc reviewed 444/continue to monitor. Orders placed for lamotrigine level-pending results  Disposition: Recommend psychiatric Inpatient admission when medically cleared.  This service was provided via telemedicine using a 2-way, interactive audio and video technology.  Names of all persons participating in this telemedicine service and their role in this encounter. Name:  Zada Girt Role: patient   Name: T.Kalieb Freeland  Role: NP  Name: D. umburger Role: TTS counselor       Oneta Rack, NP 07/19/2022 1:16 PM

## 2022-07-19 NOTE — Progress Notes (Signed)
PROGRESS NOTE    Heidi Fuentes  ZOX:[id number] DOB: [birth year] DOA: 07/15/2022 PCP: Tanna Furry, MD   Brief Narrative:    Heidi Fuentes is a 77 y.o. female with medical history significant for asthma, bipolar disorder, chronic pain, hypertension, bronchiectasis. Patient was brought to the ED via EMS reports of altered mental status.  Patient was admitted for acute metabolic encephalopathy in the setting of severe sepsis likely due to UTI.  UTI has now resolved, but she continues to have some delirium versus psychiatric issues that are persisting.  She will require psychiatric evaluation.  Assessment & Plan:   Principal Problem:   Severe sepsis (HCC) Active Problems:   UTI (urinary tract infection)   Acute metabolic encephalopathy   Essential hypertension  Assessment and Plan:   Severe sepsis (HCC)-resolved Severe sepsis likely due to urinary tract infection.  Meeting severe sepsis criteria with tachycardia, heart rate 118-129, with leukocytosis of 16.1, and evidence of endorgan dysfunction- encephalopathy.  Lactic acid 1.2.  No recent urine cultures on file -Blood cultures with no growth noted thus far -Urine cultures with mult. sp -IV ceftriaxone completed after 3 days   Acute metabolic encephalopathy-persistent Presenting with confused speech, on my evaluation patient is somnolent, following some directions.  CT negative for acute abnormality.  Encephalopathy likely secondary to severe sepsis, UTI, dehydration -TSH 0.581 -Resumed all home medications 6/15, continues to have some persistent psychiatric issues versus delirium at this point.  TTS evaluation ordered. -PT/OT evaluation with recommendations for SNF on discharge   Essential hypertension Systolic 130s to 811B. -Hold HCTZ to allow for hydration, -Resume metoprolol, losartan -Refusing home medications at this time    DVT prophylaxis:Lovenox Code Status: Full Family Communication: Discussed with son at  bedside 6/16 Disposition Plan:  Status is: Inpatient Remains inpatient appropriate because: Need for IV medications   Consultants:  None  Procedures:  None  Antimicrobials:  Anti-infectives (From admission, onward)    Start     Dose/Rate Route Frequency Ordered Stop   07/15/22 1630  cefTRIAXone (ROCEPHIN) 1 g in sodium chloride 0.9 % 100 mL IVPB  Status:  Discontinued        1 g 200 mL/hr over 30 Minutes Intravenous Every 24 hours 07/15/22 1617 07/18/22 0725       Subjective: Patient seen and evaluated today and is very confused and appears delirious.  No acute overnight events noted.  Objective: Vitals:   07/18/22 0918 07/18/22 1217 07/18/22 2112 07/19/22 0618  BP:  (!) 117/53 133/74 (!) 150/79  Pulse:  81 88 77  Resp:  16 20 18   Temp:  99.2 F (37.3 C) 98.7 F (37.1 C) 98.3 F (36.8 C)  TempSrc:  Axillary    SpO2: 96% 96%    Weight:      Height:        Intake/Output Summary (Last 24 hours) at 07/19/2022 1015 Last data filed at 07/19/2022 1478 Gross per 24 hour  Intake 360 ml  Output --  Net 360 ml   Filed Weights   07/15/22 1130  Weight: 64 kg    Examination:  General exam: Appears very confused and babbling incoherently Respiratory system: Clear to auscultation. Respiratory effort normal. Cardiovascular system: S1 & S2 heard, RRR.  Gastrointestinal system: Abdomen is soft Central nervous system: Alert and awake Extremities: No edema Skin: No significant lesions noted Psychiatry: Flat affect.    Data Reviewed: I have personally reviewed following labs and imaging studies  CBC: Recent Labs  Lab  07/15/22 1240 07/16/22 0418 07/17/22 0435 07/18/22 0323 07/19/22 0448  WBC 16.1* 11.7* 13.6* 13.0* 12.5*  NEUTROABS 13.2*  --   --   --   --   HGB 15.8* 13.4 15.6* 15.6* 14.9  HCT 47.2* 41.1 47.1* 47.6* 44.8  MCV 94.6 96.0 97.1 96.4 94.7  PLT 329 269 260 275 251   Basic Metabolic Panel: Recent Labs  Lab 07/15/22 1240 07/16/22 0418  07/17/22 0435 07/18/22 0323 07/19/22 0448  NA 137 139 140 139 140  K 3.6 3.3* 3.4* 3.5 3.7  CL 100 106 102 104 103  CO2 18* 19* 21* 19* 27  GLUCOSE 109* 85 82 87 97  BUN 29* 20 14 19  30*  CREATININE 0.81 0.66 0.53 0.61 0.66  CALCIUM 9.9 8.9 9.6 9.5 9.6  MG  --   --  2.2 2.2 2.2   GFR: Estimated Creatinine Clearance: 49.2 mL/min (by C-G formula based on SCr of 0.66 mg/dL). Liver Function Tests: Recent Labs  Lab 07/15/22 1240  AST 46*  ALT 37  ALKPHOS 66  BILITOT 1.5*  PROT 7.9  ALBUMIN 4.6   No results for input(s): "LIPASE", "AMYLASE" in the last 168 hours. Recent Labs  Lab 07/15/22 1240  AMMONIA <10   Coagulation Profile: No results for input(s): "INR", "PROTIME" in the last 168 hours. Cardiac Enzymes: No results for input(s): "CKTOTAL", "CKMB", "CKMBINDEX", "TROPONINI" in the last 168 hours. BNP (last 3 results) No results for input(s): "PROBNP" in the last 8760 hours. HbA1C: No results for input(s): "HGBA1C" in the last 72 hours. CBG: Recent Labs  Lab 07/15/22 1245  GLUCAP 107*   Lipid Profile: No results for input(s): "CHOL", "HDL", "LDLCALC", "TRIG", "CHOLHDL", "LDLDIRECT" in the last 72 hours. Thyroid Function Tests: No results for input(s): "TSH", "T4TOTAL", "FREET4", "T3FREE", "THYROIDAB" in the last 72 hours.  Anemia Panel: No results for input(s): "VITAMINB12", "FOLATE", "FERRITIN", "TIBC", "IRON", "RETICCTPCT" in the last 72 hours. Sepsis Labs: Recent Labs  Lab 07/15/22 1629  LATICACIDVEN 1.2    Recent Results (from the past 240 hour(s))  Urine Culture (for pregnant, neutropenic or urologic patients or patients with an indwelling urinary catheter)     Status: Abnormal   Collection Time: 07/15/22  4:18 PM   Specimen: Urine, Clean Catch  Result Value Ref Range Status   Specimen Description   Final    URINE, CLEAN CATCH Performed at [facility], [address], [place], [place] [address]    Special Requests   Final    NONE Performed at  [facility], [address] [place], [place] [address]    Culture MULTIPLE SPECIES PRESENT, SUGGEST RECOLLECTION (A)  Final   Report Status 07/16/2022 FINAL  Final  Culture, blood (Routine X 2) w Reflex to ID Panel     Status: None (Preliminary result)   Collection Time: 07/15/22  4:29 PM   Specimen: BLOOD  Result Value Ref Range Status   Specimen Description BLOOD BLOOD RIGHT ARM  Final   Special Requests   Final    BOTTLES DRAWN AEROBIC AND ANAEROBIC Blood Culture adequate volume   Culture   Final    NO GROWTH 3 DAYS Performed at [facility], [address] [place], [place] [address]    Report Status PENDING  Incomplete  Culture, blood (Routine X 2) w Reflex to ID Panel     Status: None (Preliminary result)   Collection Time: 07/15/22  4:38 PM   Specimen: Site Not Specified; Blood  Result Value Ref Range  Status   Specimen Description SITE NOT SPECIFIED  Final   Special Requests   Final    BOTTLES DRAWN AEROBIC AND ANAEROBIC Blood Culture adequate volume   Culture   Final    NO GROWTH 3 DAYS Performed at [facility], [address] [place], [place] [address]    Report Status PENDING  Incomplete         Radiology Studies: No results found.      Scheduled Meds:  buPROPion  75 mg Oral BID   enoxaparin (LOVENOX) injection  40 mg Subcutaneous Q24H   FLUoxetine  40 mg Oral Daily   gabapentin  300 mg Oral QHS   lamoTRIgine  200 mg Oral BID   latanoprost  1 drop Both Eyes QHS   levothyroxine  50 mcg Oral Daily   liver oil-zinc oxide   Topical Daily   losartan  25 mg Oral Daily   metoprolol tartrate  12.5 mg Oral BID   montelukast  10 mg Oral Daily   pantoprazole  40 mg Oral Daily    LOS: 4 days    Time spent: 35 minutes    Alya Smaltz Hoover Brunette, DO [facility]  If 7PM-7AM, please contact night-coverage [email or web address] 07/19/2022, 10:15 AM

## 2022-07-19 NOTE — Progress Notes (Signed)
Patient is medically clear for psychiatric evaluation today.

## 2022-07-19 NOTE — Progress Notes (Addendum)
Pt is not medically cleared per Erick Blinks, DO due to need of IV meds. Pt meets inpatient behavioral health placement per Joyice Faster, NP. CSW/ Disposition will assist and follow with placement upon medical clearance.   CSW advised [facility] Antoinette Cillo, RN and there are no available beds at [facility] or [facility] system and will be reviewed again tomorrow.  [name], MSW, [facility] 07/19/2022 2:57 PM

## 2022-07-20 DIAGNOSIS — R6520 Severe sepsis without septic shock: Secondary | ICD-10-CM | POA: Diagnosis not present

## 2022-07-20 DIAGNOSIS — A419 Sepsis, unspecified organism: Secondary | ICD-10-CM | POA: Diagnosis not present

## 2022-07-20 LAB — CULTURE, BLOOD (ROUTINE X 2)
Culture: NO GROWTH
Special Requests: ADEQUATE

## 2022-07-20 MED ORDER — OLANZAPINE 5 MG PO TBDP
2.5000 mg | ORAL_TABLET | Freq: Every day | ORAL | Status: DC
  Filled 2022-07-20 (×5): qty 0.5

## 2022-07-20 MED ORDER — OLANZAPINE 5 MG PO TBDP
5.0000 mg | ORAL_TABLET | Freq: Every day | ORAL | Status: DC
  Filled 2022-07-20 (×4): qty 1

## 2022-07-20 NOTE — Progress Notes (Signed)
EKG performed, patient was did not cooperate; reading was NSR. Dr. notified and no new orders. Patient did sleep at all and continued with word salads. Patient refused morning meds and was combative when being changed. IV was flushed and blood return noted.

## 2022-07-20 NOTE — Progress Notes (Signed)
LCSW Progress Note  [id number]   Heidi Fuentes  07/20/2022  1:02 PM  Description:   Inpatient Psychiatric Referral  Patient was recommended inpatient per Hillery Jacks, NP. There are no available beds at [facility] or [facility] unit, per [facility] Rona Ravens, RN. Patient was referred to the following out of network facilities:   [facility] Provider Address Phone Fax  [facility]  [address], [place] [address] [phone]  [facility] [address]  [address] [place] [address] [phone]  [facility]  [phone] [place] [address] [phone]  [facility]  [address] [place], [place] [address] [phone]  [facility]  [address] [place] [address] [phone]  [facility]  [address], [place] [address] [phone]  [facility]  [address] [place] [address] [phone]  [facility]  [address] [place] [address] [phone]  [facility]  [address], [place] [address] [phone]  [facility]  [address] [place] [address] [phone]  [facility]  [address], [place] [address] [phone]  [facility]  [address] [place] [address] [phone]  [facility]  [address], [place] [address] [phone]  [facility]  [address] [place] [address] [phone]  [facility]  [address] [place] [address] [phone]  [facility]  [address], [place] [address] [phone]  [facility]  [address] [place] [address] [phone]    Situation ongoing, CSW to continue following and update chart as more information becomes available.      Cathie Beams, LCSW  07/20/2022 1:02 PM

## 2022-07-20 NOTE — Care Management Important Message (Deleted)
Important Message  Patient Details  Name: Heidi Fuentes MRN: [id number] Date of Birth: 08-Aug-1945   Medicare Important Message Given:  Yes     Corey Harold 07/20/2022, 12:28 PM

## 2022-07-20 NOTE — Progress Notes (Addendum)
[facility] MD Progress Note  07/20/2022 10:16 AM Heidi Fuentes  MRN:  [id number]  Subjective:  Heidi Fuentes 77 year old female was seen and evaluated.  Video telehealth assessment.  She continues to present disorganized tangential pressured and irritable.  Unable to answer orientation questions on to state " I am going to fire you."  Expletives.  Patient was initiated on Zyprexa 5 mg nightly however was reported that patient continues to refuse medications.  Chart review patient has not been resting well at night.  Medication adjustment with Zyprexa 2.5 mg daily and Zyprexa 5 mg p.o. nightly.  Consideration for involuntary commitment.  Will continue to recommend inpatient admission once medically cleared.  Support encouragement and reassurance was provided.  Per initial admission assessment:on the brief narrative: "Heidi Fuentes is a 77 y.o. female with medical history significant for asthma, bipolar disorder, chronic pain, hypertension, bronchiectasis.Patient was brought to the ED via EMS reports of altered mental status.  Patient was admitted for acute metabolic encephalopathy in the setting of severe sepsis likely due to UTI.  UTI has now resolved, but she continues to have some delirium versus psychiatric issues that are persisting.  She will require psychiatric evaluation."    Principal Problem: Severe sepsis (HCC) Diagnosis: Principal Problem:   Severe sepsis (HCC) Active Problems:   Essential hypertension   UTI (urinary tract infection)   Acute metabolic encephalopathy  Total Time spent with patient: 15 minutes  Past Psychiatric History:   Past Medical History:  Past Medical History:  Diagnosis Date   Arthritis    Asthma    Back pain    Chronic pain syndrome 07/10/2013   Depression    Duodenal papillary stenosis    Dyspnea    Esophageal dysmotility    Fibromyalgia    GERD (gastroesophageal reflux disease)    Glaucoma    H/O wheezing    History of hiatal hernia    History of  palpitations    HOH (hard of hearing)    HOH (hard of hearing)    Hypertension    Hypothyroidism    Macular degeneration    Orthopnea    Stenosis, spinal, lumbar    Thyroid disease    Tubular adenoma    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[facility])     Past Surgical History:  Procedure Laterality Date   CATARACT EXTRACTION W/PHACO Right 10/06/2016   Procedure: CATARACT EXTRACTION PHACO AND INTRAOCULAR LENS PLACEMENT (IOC);  Surgeon: Galen Manila, MD;  Location: [facility] ORS;  Service: Ophthalmology;  Laterality: Right;  [place] 00:32.5 AP% 14.5 CDE 4.71 Fluid pack lot # [id number]   CATARACT EXTRACTION W/PHACO Left 11/03/2016   Procedure: CATARACT EXTRACTION PHACO AND INTRAOCULAR LENS PLACEMENT (IOC);  Surgeon: Galen Manila, MD;  Location: [facility] ORS;  Service: Ophthalmology;  Laterality: Left;  [place] 00:36.6 AP% 16.0 CDE 5.86 Fluid Pack lot # [id number]   COCCYX REMOVAL     colonoscopy  2005   Dr. Karilyn Cota: mild melanosis coli, otherwise normal   COLONOSCOPY WITH PROPOFOL N/A 09/14/2013   AOZ:HYQMVHQIO coli. Colonic diverticulosis. Single colonic. Tubular adenoma. Next TCS 09/2020.   COLONOSCOPY WITH PROPOFOL N/A 07/24/2021   Procedure: COLONOSCOPY WITH PROPOFOL;  Surgeon: Corbin Ade, MD;  [facility];  Service: Endoscopy;  Laterality: N/A;  2:00pm   ERCP  1997   [facility]: biliary manometry abnormal, subsequent sphincterotomy    ESOPHAGEAL MANOMETRY N/A 12/21/2016   Surgeon: Napoleon Form, MD; EG junction outflow obstruction which could be secondary to large hiatal hernia  and possible coiling of catheter.  Not consistent with achalasia or other variant.  No major peristaltic motility disorder.   ESOPHAGOGASTRODUODENOSCOPY (EGD) WITH PROPOFOL N/A 09/14/2013   ZOX:[id number] ring. Hiatal hernia. Status post Elease Hashimoto and biopsy disruption.    FOOT SURGERY     X2   HEMORROIDECTOMY     HERNIA REPAIR     inguinal right   HERNIA REPAIR     LAPAROSCOPIC PARAESOPHAGEAL HERNIA REPAIR   02/2017   Dr. Carolynn Sayers [facility])   [place] DILATION N/A 09/14/2013   Procedure: Alvy Beal;  Surgeon: Corbin Ade, MD;  Location: [facility];  Service: Endoscopy;  Laterality: N/A;  56   POLYPECTOMY N/A 09/14/2013   Procedure: POLYPECTOMY;  Surgeon: Corbin Ade, MD;  Location: [facility];  Service: Endoscopy;  Laterality: N/A;   POLYPECTOMY  07/24/2021   Procedure: POLYPECTOMY;  Surgeon: Corbin Ade, MD;  [facility];  Service: Endoscopy;;   TONSILLECTOMY     Family History:  Family History  Problem Relation Age of Onset   Lung cancer Mother    Heart attack Father    Bone cancer Brother    COPD Sister    Colon cancer Neg Hx    Family Psychiatric  History:  Social History:  Social History   Substance and Sexual Activity  Alcohol Use No     Social History   Substance and Sexual Activity  Drug Use No    Social History   Socioeconomic History   Marital status: Divorced    Spouse name: Not on file   Number of children: Not on file   Years of education: Not on file   Highest education level: Not on file  Occupational History   Not on file  Tobacco Use   Smoking status: Never   Smokeless tobacco: Never   Tobacco comments:    NEVER SMOKED  Vaping Use   Vaping Use: Never used  Substance and Sexual Activity   Alcohol use: No   Drug use: No   Sexual activity: Not on file  Other Topics Concern   Not on file  Social History Narrative   Not on file   Social Determinants of Health   Financial Resource Strain: Not on file  Food Insecurity: No Food Insecurity (07/15/2022)   Hunger Vital Sign    Worried About Running Out of Food in the Last Year: Never true    Ran Out of Food in the Last Year: Never true  Transportation Needs: No Transportation Needs (07/15/2022)   PRAPARE - Administrator, Civil Service (Medical): No    Lack of Transportation (Non-Medical): No  Physical Activity: Not on file  Stress: Not on file  Social  Connections: Not on file   Additional Social History:                         Sleep: Poor  Appetite:  Fair  Current Medications: Current Facility-Administered Medications  Medication Dose Route Frequency Provider Last Rate Last Admin   acetaminophen (TYLENOL) tablet 650 mg  650 mg Oral Q6H PRN Emokpae, Ejiroghene E, MD       Or   acetaminophen (TYLENOL) suppository 650 mg  650 mg Rectal Q6H PRN Emokpae, Ejiroghene E, MD       albuterol (PROVENTIL) (2.5 MG/3ML) 0.083% nebulizer solution 2.5 mg  2.5 mg Nebulization Q6H PRN Emokpae, Ejiroghene E, MD       enoxaparin (LOVENOX) injection  40 mg  40 mg Subcutaneous Q24H Emokpae, Ejiroghene E, MD   40 mg at 07/19/22 2214   gabapentin (NEURONTIN) capsule 300 mg  300 mg Oral QHS Sherryll Burger, Pratik D, DO   300 mg at 07/19/22 2215   hydrALAZINE (APRESOLINE) injection 10 mg  10 mg Intravenous Q4H PRN Maurilio Lovely D, DO   10 mg at 07/17/22 2202   lamoTRIgine (LAMICTAL) tablet 200 mg  200 mg Oral BID Maurilio Lovely D, DO   200 mg at 07/19/22 2215   latanoprost (XALATAN) 0.005 % ophthalmic solution 1 drop  1 drop Both Eyes QHS Emokpae, Ejiroghene E, MD   1 drop at 07/19/22 2218   levothyroxine (SYNTHROID) tablet 50 mcg  50 mcg Oral Daily Maurilio Lovely D, DO   50 mcg at 07/18/22 1359   liver oil-zinc oxide (DESITIN) 40 % ointment   Topical Daily Maurilio Lovely D, DO   Given at 07/19/22 8295   losartan (COZAAR) tablet 25 mg  25 mg Oral Daily Sherryll Burger, Pratik D, DO   25 mg at 07/19/22 6213   metoprolol tartrate (LOPRESSOR) tablet 12.5 mg  12.5 mg Oral BID Maurilio Lovely D, DO   12.5 mg at 07/19/22 2216   mometasone-formoterol (DULERA) 200-5 MCG/ACT inhaler 2 puff  2 puff Inhalation BID PRN Sherryll Burger, Pratik D, DO       montelukast (SINGULAIR) tablet 10 mg  10 mg Oral Daily Sherryll Burger, Pratik D, DO   10 mg at 07/19/22 0902   OLANZapine zydis (ZYPREXA) disintegrating tablet 2.5 mg  2.5 mg Oral Daily Oneta Rack, NP       And   OLANZapine zydis (ZYPREXA) disintegrating  tablet 5 mg  5 mg Oral QHS Berkley Cronkright, Jerene Pitch, NP       ondansetron (ZOFRAN) tablet 4 mg  4 mg Oral Q8H PRN Emokpae, Ejiroghene E, MD       Or   ondansetron (ZOFRAN) injection 4 mg  4 mg Intravenous Q8H PRN Emokpae, Ejiroghene E, MD       pantoprazole (PROTONIX) EC tablet 40 mg  40 mg Oral Daily Sherryll Burger, Pratik D, DO   40 mg at 07/19/22 0901   umeclidinium bromide (INCRUSE ELLIPTA) 62.5 MCG/ACT 1 puff  1 puff Inhalation Daily PRN Maurilio Lovely D, DO        Lab Results:  Results for orders placed or performed during the hospital encounter of 07/15/22 (from the past 48 hour(s))  Basic metabolic panel     Status: Abnormal   Collection Time: 07/19/22  4:48 AM  Result Value Ref Range   Sodium 140 135 - 145 mmol/L   Potassium 3.7 3.5 - 5.1 mmol/L   Chloride 103 98 - 111 mmol/L   CO2 27 22 - 32 mmol/L   Glucose, Bld 97 70 - 99 mg/dL    Comment: Glucose reference range applies only to samples taken after fasting for at least 8 hours.   BUN 30 (H) 8 - 23 mg/dL   Creatinine, Ser 0.86 0.44 - 1.00 mg/dL   Calcium 9.6 8.9 - 57.8 mg/dL   GFR, Estimated >46 >96 mL/min    Comment: (NOTE) Calculated using the CKD-EPI Creatinine Equation (2021)    Anion gap 10 5 - 15    Comment: Performed at [facility], [address] [place], [place] [address]  Magnesium     Status: None   Collection Time: 07/19/22  4:48 AM  Result Value Ref Range   Magnesium 2.2 1.7 - 2.4 mg/dL  Comment: Performed at [facility], [address] [place], [place] [address]  CBC     Status: Abnormal   Collection Time: 07/19/22  4:48 AM  Result Value Ref Range   WBC 12.5 (H) 4.0 - 10.5 K/uL   RBC 4.73 3.87 - 5.11 MIL/uL   Hemoglobin 14.9 12.0 - 15.0 g/dL   HCT 60.4 54.0 - 98.1 %   MCV 94.7 80.0 - 100.0 fL   MCH 31.5 26.0 - 34.0 pg   MCHC 33.3 30.0 - 36.0 g/dL   RDW 19.1 47.8 - 29.5 %   Platelets 251 150 - 400 K/uL   nRBC 0.0 0.0 - 0.2 %    Comment: Performed at [facility], [address], [place], [place] [address]     Blood Alcohol level:  Lab Results  Component Value Date   [facility] <10 07/15/2022   ETH <10 08/06/2020    Metabolic Disorder Labs: Lab Results  Component Value Date   HGBA1C 5.7 (H) 07/09/2013   MPG 117 (H) 07/09/2013   No results found for: "PROLACTIN" No results found for: "CHOL", "TRIG", "HDL", "CHOLHDL", "VLDL", "LDLCALC"  Physical Findings: AIMS:  , ,  ,  ,    CIWA:    COWS:     Musculoskeletal:   Psychiatric Specialty Exam:  Presentation  General Appearance:  Disheveled  Eye Contact: Minimal  Speech: Pressured  Speech Volume: Increased  Handedness: Right   Mood and Affect  Mood: Irritable; Anxious; Labile  Affect: Labile   Thought Process  Thought Processes: Disorganized  Descriptions of Associations:Tangential  Orientation:Partial  Thought Content:Scattered; Rumination  History of Schizophrenia/Schizoaffective disorder:No data recorded Duration of Psychotic Symptoms:No data recorded Hallucinations:Hallucinations: None  Ideas of Reference:None  Suicidal Thoughts:Suicidal Thoughts: No  Homicidal Thoughts:Homicidal Thoughts: No   Sensorium  Memory:No data recorded Judgment: Poor  Insight: Poor   Executive Functions  Concentration: Poor  Attention Span: Poor  Recall: Poor  Fund of Knowledge: Poor  Language: Poor   Psychomotor Activity  Psychomotor Activity: Psychomotor Activity: Normal   Assets  Assets: Social Support; Desire for Improvement   Sleep  Sleep: Sleep: Poor    Physical Exam: Physical Exam Vitals reviewed.  Constitutional:      Appearance: Normal appearance.  Neurological:     Mental Status: She is alert and oriented to person, place, and time.  Psychiatric:        Mood and Affect: Mood normal.        Behavior: Behavior normal.   Review of Systems  Psychiatric/Behavioral:  The patient is nervous/anxious.   All other systems reviewed and are negative.  Blood pressure 136/85,  pulse 74, temperature 98.3 F (36.8 C), temperature source Oral, resp. rate 18, height 5' (1.524 m), weight 64 kg, SpO2 96 %. Body mass index is 27.56 kg/m.   Treatment Plan Summary: Daily contact with patient to assess and evaluate symptoms and progress in treatment and Medication management  CSW to continue seeking inpatient admission Patient to start Zyprexa 2.5 mg daily and 5 mg nightly  Oneta Rack, NP 07/20/2022, 10:16 AM

## 2022-07-20 NOTE — Discharge Summary (Signed)
Physician Discharge Summary  Heidi Fuentes UJW:[id number] DOB: January 11, 1946 DOA: 07/15/2022  PCP: Tanna Furry, MD  Admit date: 07/15/2022  Discharge date: 07/20/2022  Admitted From:SNF  Disposition:  [facility]  Recommendations for Outpatient Follow-up:  Follow up with PCP in 1-2 weeks Follow up at [facility] Continue medications as noted below  Home Health: None  Equipment/Devices: None  Discharge Condition:Stable  CODE STATUS: Full  Diet recommendation: Heart Healthy  Brief/Interim Summary: Heidi Fuentes is a 77 y.o. female with medical history significant for asthma, bipolar disorder, chronic pain, hypertension, bronchiectasis. Patient was brought to the ED via EMS reports of altered mental status.  Patient was admitted for acute metabolic encephalopathy in the setting of severe sepsis likely due to UTI.  UTI has now resolved, but she continues to have some delirium versus psychiatric issues that are persisting.  She was assessed by psychiatry on 6/16 and was given a dose of Zyprexa without any significant improvement.  She has had her Wellbutrin and Prozac discontinued.  Recommendation is for behavioral health hospitalization.  She is currently stable for discharge and no further medical issues noted.  Discharge Diagnoses:  Principal Problem:   Severe sepsis (HCC) Active Problems:   UTI (urinary tract infection)   Acute metabolic encephalopathy   Essential hypertension  Principal discharge diagnosis: Severe sepsis secondary to UTI, POA-resolved.  Acute encephalopathy multifactorial with bipolar disorder requiring BHH.  Discharge Instructions  Discharge Instructions     Diet - low sodium heart healthy   Complete by: As directed    Increase activity slowly   Complete by: As directed       Allergies as of 07/20/2022       Reactions   Abilify [aripiprazole] Other (See Comments)   Bad dreams   Geodon [ziprasidone Hcl] Other (See Comments)   hospitalization specifics    Quetiapine Palpitations   Sulfa Antibiotics Rash   Trazodone And Nefazodone Other (See Comments)   Reaction is unknown        Medication List     STOP taking these medications    buPROPion 75 MG tablet Commonly known as: WELLBUTRIN   FLUoxetine 40 MG capsule Commonly known as: PROZAC   Oxycodone HCl 10 MG Tabs       TAKE these medications    acetaminophen 500 MG tablet Commonly known as: TYLENOL Take 500 mg by mouth every 6 (six) hours as needed for mild pain or moderate pain. For pain   albuterol 108 (90 Base) MCG/ACT inhaler Commonly known as: VENTOLIN HFA Inhale 1 puff into the lungs every 4 (four) hours as needed for wheezing or shortness of breath. For shortness of breath   albuterol (2.5 MG/3ML) 0.083% nebulizer solution Commonly known as: PROVENTIL Take 3 mLs (2.5 mg total) by nebulization every 6 (six) hours as needed for wheezing or shortness of breath.   beclomethasone 80 MCG/ACT inhaler Commonly known as: QVAR Inhale 1 puff into the lungs daily.   Breztri Aerosphere 160-9-4.8 MCG/ACT Aero Generic drug: Budeson-Glycopyrrol-Formoterol Inhale 2 puffs into the lungs in the morning and at bedtime. What changed: how much to take   cetirizine 5 MG tablet Commonly known as: ZYRTEC Take 5 mg by mouth at bedtime.   chlorhexidine 0.12 % solution Commonly known as: PERIDEX Use as directed 15 mLs in the mouth or throat 2 (two) times daily.   cholecalciferol 25 MCG (1000 UNIT) tablet Commonly known as: VITAMIN D3 Take 1,000 Units by mouth daily.   famotidine 20 MG tablet  Commonly known as: Pepcid One after supper   fluticasone 50 MCG/ACT nasal spray Commonly known as: FLONASE Place 2 sprays into both nostrils daily.   gabapentin 300 MG capsule Commonly known as: NEURONTIN Take 300 mg by mouth at bedtime.   hydrochlorothiazide 12.5 MG tablet Commonly known as: HYDRODIURIL Take 12.5 mg by mouth daily as needed (swelling).   ketoconazole 2 %  cream Commonly known as: NIZORAL Apply 1 Application topically daily as needed for irritation.   lamoTRIgine 200 MG tablet Commonly known as: LAMICTAL Take 200 mg by mouth daily.   latanoprost 0.005 % ophthalmic solution Commonly known as: XALATAN Place 1 drop into both eyes daily as needed.   levothyroxine 50 MCG tablet Commonly known as: SYNTHROID Take 50 mcg by mouth daily.   losartan 25 MG tablet Commonly known as: COZAAR Take 1 tablet (25 mg total) by mouth daily.   melatonin 5 MG Tabs Take 5 mg by mouth at bedtime.   metoprolol tartrate 25 MG tablet Commonly known as: LOPRESSOR Take 12.5 mg by mouth 2 (two) times daily.   montelukast 10 MG tablet Commonly known as: SINGULAIR Take 10 mg by mouth daily.   pantoprazole 40 MG tablet Commonly known as: Protonix Take 1 tablet (40 mg total) by mouth daily. Take 30-60 min before first meal of the day   PRESERVISION/LUTEIN PO Take 1 tablet by mouth 2 (two) times daily.        Follow-up Information     Zhou-Talbert, Ralene Bathe, MD. Schedule an appointment as soon as possible for a visit.   Specialty: Family Medicine Contact information: [address] [place] [address] [phone]                Allergies  Allergen Reactions   Abilify [Aripiprazole] Other (See Comments)    Bad dreams   Geodon [Ziprasidone Hcl] Other (See Comments)    hospitalization specifics   Quetiapine Palpitations   Sulfa Antibiotics Rash   Trazodone And Nefazodone Other (See Comments)    Reaction is unknown    Consultations: Psychiatry   Procedures/Studies: DG CHEST PORT 1 VIEW  Result Date: 07/15/2022 CLINICAL DATA:  Severe sepsis.  Altered mental status. EXAM: PORTABLE CHEST 1 VIEW COMPARISON:  08/03/2020 FINDINGS: Patient rotation limits examination. Shallow inspiration with elevation of left hemidiaphragm. Heart size is normal. Lungs appear clear and expanded. No pleural effusions. No pneumothorax. Mediastinal  contours appear intact. Calcification of the aorta. Degenerative changes in the spine and shoulders. No change since prior study. IMPRESSION: Shallow inspiration.  No evidence of active pulmonary disease. Electronically Signed   By: Burman Nieves M.D.   On: 07/15/2022 22:44   CT HEAD WO CONTRAST  Result Date: 07/15/2022 CLINICAL DATA:  Mental status change, unknown cause EXAM: CT HEAD WITHOUT CONTRAST TECHNIQUE: Contiguous axial images were obtained from the base of the skull through the vertex without intravenous contrast. RADIATION DOSE REDUCTION: This exam was performed according to the departmental dose-optimization program which includes automated exposure control, adjustment of the mA and/or kV according to patient size and/or use of iterative reconstruction technique. COMPARISON:  CT Head 08/03/20 FINDINGS: Brain: No evidence of acute infarction, hemorrhage, hydrocephalus, extra-axial collection or mass lesion/mass effect. Sequela of mild chronic microvascular ischemic change. Vascular: No hyperdense vessel or unexpected calcification. Skull: Normal. Negative for fracture or focal lesion. Sinuses/Orbits: No middle ear or mastoid effusion. Paranasal sinuses are clear. Bilateral lens replacement. Orbits are otherwise unremarkable. Other: None. IMPRESSION: No acute intracranial abnormality. Electronically  Signed   By: Lorenza Cambridge M.D.   On: 07/15/2022 14:27     Discharge Exam: Vitals:   07/19/22 2126 07/20/22 0634  BP: (!) 170/73 136/85  Pulse: 84 74  Resp:    Temp: 98.2 F (36.8 C) 98.3 F (36.8 C)  SpO2:     Vitals:   07/18/22 2112 07/19/22 0618 07/19/22 2126 07/20/22 0634  BP: 133/74 (!) 150/79 (!) 170/73 136/85  Pulse: 88 77 84 74  Resp: 20 18    Temp: 98.7 F (37.1 C) 98.3 F (36.8 C) 98.2 F (36.8 C) 98.3 F (36.8 C)  TempSrc:   Oral Oral  SpO2:      Weight:      Height:        General: Pt is alert, awake, not in acute distress Cardiovascular: RRR, S1/S2 +, no rubs, no  gallops Respiratory: CTA bilaterally, no wheezing, no rhonchi Abdominal: Soft, NT, ND, bowel sounds + Extremities: no edema, no cyanosis    The results of significant diagnostics from this hospitalization (including imaging, microbiology, ancillary and laboratory) are listed below for reference.     Microbiology: Recent Results (from the past 240 hour(s))  Urine Culture (for pregnant, neutropenic or urologic patients or patients with an indwelling urinary catheter)     Status: Abnormal   Collection Time: 07/15/22  4:18 PM   Specimen: Urine, Clean Catch  Result Value Ref Range Status   Specimen Description   Final    URINE, CLEAN CATCH Performed at [facility], [address] [place], [place] [address]    Special Requests   Final    NONE Performed at [facility], [address], [place], [place] [address]    Culture MULTIPLE SPECIES PRESENT, SUGGEST RECOLLECTION (A)  Final   Report Status 07/16/2022 FINAL  Final  Culture, blood (Routine X 2) w Reflex to ID Panel     Status: None   Collection Time: 07/15/22  4:29 PM   Specimen: BLOOD  Result Value Ref Range Status   Specimen Description BLOOD BLOOD RIGHT ARM  Final   Special Requests   Final    BOTTLES DRAWN AEROBIC AND ANAEROBIC Blood Culture adequate volume   Culture   Final    NO GROWTH 5 DAYS Performed at [facility], [address] [place], [place] [address]    Report Status 07/20/2022 FINAL  Final  Culture, blood (Routine X 2) w Reflex to ID Panel     Status: None   Collection Time: 07/15/22  4:38 PM   Specimen: Site Not Specified; Blood  Result Value Ref Range Status   Specimen Description SITE NOT SPECIFIED  Final   Special Requests   Final    BOTTLES DRAWN AEROBIC AND ANAEROBIC Blood Culture adequate volume   Culture   Final    NO GROWTH 5 DAYS Performed at [facility], [address], [place], [place] [address]    Report Status 07/20/2022 FINAL  Final     Labs: BNP (last 3 results) No results for  input(s): "BNP" in the last 8760 hours. Basic Metabolic Panel: Recent Labs  Lab 07/15/22 1240 07/16/22 0418 07/17/22 0435 07/18/22 0323 07/19/22 0448  NA 137 139 140 139 140  K 3.6 3.3* 3.4* 3.5 3.7  CL 100 106 102 104 103  CO2 18* 19* 21* 19* 27  GLUCOSE 109* 85 82 87 97  BUN 29* 20 14 19  30*  CREATININE 0.81 0.66 0.53 0.61 0.66  CALCIUM 9.9 8.9 9.6 9.5 9.6  MG  --   --  2.2 2.2 2.2   Liver Function Tests: Recent Labs  Lab 07/15/22 1240  AST 46*  ALT 37  ALKPHOS 66  BILITOT 1.5*  PROT 7.9  ALBUMIN 4.6   No results for input(s): "LIPASE", "AMYLASE" in the last 168 hours. Recent Labs  Lab 07/15/22 1240  AMMONIA <10   CBC: Recent Labs  Lab 07/15/22 1240 07/16/22 0418 07/17/22 0435 07/18/22 0323 07/19/22 0448  WBC 16.1* 11.7* 13.6* 13.0* 12.5*  NEUTROABS 13.2*  --   --   --   --   HGB 15.8* 13.4 15.6* 15.6* 14.9  HCT 47.2* 41.1 47.1* 47.6* 44.8  MCV 94.6 96.0 97.1 96.4 94.7  PLT 329 269 260 275 251   Cardiac Enzymes: No results for input(s): "CKTOTAL", "CKMB", "CKMBINDEX", "TROPONINI" in the last 168 hours. BNP: Invalid input(s): "POCBNP" CBG: Recent Labs  Lab 07/15/22 1245  GLUCAP 107*   D-Dimer No results for input(s): "DDIMER" in the last 72 hours. Hgb A1c No results for input(s): "HGBA1C" in the last 72 hours. Lipid Profile No results for input(s): "CHOL", "HDL", "LDLCALC", "TRIG", "CHOLHDL", "LDLDIRECT" in the last 72 hours. Thyroid function studies No results for input(s): "TSH", "T4TOTAL", "T3FREE", "THYROIDAB" in the last 72 hours.  Invalid input(s): "FREET3" Anemia work up No results for input(s): "VITAMINB12", "FOLATE", "FERRITIN", "TIBC", "IRON", "RETICCTPCT" in the last 72 hours. Urinalysis    Component Value Date/Time   COLORURINE YELLOW 07/15/2022 1318   APPEARANCEUR HAZY (A) 07/15/2022 1318   APPEARANCEUR Cloudy 06/02/2012 1042   LABSPEC 1.019 07/15/2022 1318   LABSPEC 1.017 06/02/2012 1042   PHURINE 5.0 07/15/2022 1318    GLUCOSEU NEGATIVE 07/15/2022 1318   GLUCOSEU Negative 06/02/2012 1042   HGBUR SMALL (A) 07/15/2022 1318   BILIRUBINUR NEGATIVE 07/15/2022 1318   BILIRUBINUR Negative 06/02/2012 1042   KETONESUR 80 (A) 07/15/2022 1318   PROTEINUR >=300 (A) 07/15/2022 1318   UROBILINOGEN 0.2 12/11/2014 1429   NITRITE NEGATIVE 07/15/2022 1318   LEUKOCYTESUR LARGE (A) 07/15/2022 1318   LEUKOCYTESUR 3+ 06/02/2012 1042   Sepsis Labs Recent Labs  Lab 07/16/22 0418 07/17/22 0435 07/18/22 0323 07/19/22 0448  WBC 11.7* 13.6* 13.0* 12.5*   Microbiology Recent Results (from the past 240 hour(s))  Urine Culture (for pregnant, neutropenic or urologic patients or patients with an indwelling urinary catheter)     Status: Abnormal   Collection Time: 07/15/22  4:18 PM   Specimen: Urine, Clean Catch  Result Value Ref Range Status   Specimen Description   Final    URINE, CLEAN CATCH Performed at [facility], [address] [place], [place] [address]    Special Requests   Final    NONE Performed at [facility], [address] [place], [place] [address]    Culture MULTIPLE SPECIES PRESENT, SUGGEST RECOLLECTION (A)  Final   Report Status 07/16/2022 FINAL  Final  Culture, blood (Routine X 2) w Reflex to ID Panel     Status: None   Collection Time: 07/15/22  4:29 PM   Specimen: BLOOD  Result Value Ref Range Status   Specimen Description BLOOD BLOOD RIGHT ARM  Final   Special Requests   Final    BOTTLES DRAWN AEROBIC AND ANAEROBIC Blood Culture adequate volume   Culture   Final    NO GROWTH 5 DAYS Performed at [facility], [address], [place], [place] [address]    Report Status 07/20/2022 FINAL  Final  Culture, blood (Routine X 2) w Reflex  to ID Panel     Status: None   Collection Time: 07/15/22  4:38 PM   Specimen: Site Not Specified; Blood  Result Value Ref Range Status   Specimen Description SITE NOT SPECIFIED  Final   Special Requests   Final    BOTTLES DRAWN AEROBIC AND ANAEROBIC Blood Culture  adequate volume   Culture   Final    NO GROWTH 5 DAYS Performed at [facility], [address], [place], [place] [address]    Report Status 07/20/2022 FINAL  Final     Time coordinating discharge: 35 minutes  SIGNED:   Erick Blinks, DO [facility] 07/20/2022, 10:13 AM  If 7PM-7AM, please contact night-coverage [email or web address]

## 2022-07-20 NOTE — TOC Progression Note (Signed)
Transition of Care [facility]) - Progression Note    Patient Details  Name: Heidi Fuentes MRN: [id number] Date of Birth: 05/01/1945  Transition of Care [facility]) CM/SW Contact  Annice Needy, LCSW Phone Number: 07/20/2022, 10:46 AM  Clinical Narrative:    Message left for TTS disposition advising that patient is medically cleared for psychiatric placement.    Expected Discharge Plan: Home w Home Health Services Barriers to Discharge: Continued Medical Work up  Expected Discharge Plan and Services       Living arrangements for the past 2 months: Single Family Home Expected Discharge Date: 07/20/22                                     Social Determinants of Health (SDOH) Interventions SDOH Screenings   Food Insecurity: No Food Insecurity (07/15/2022)  Housing: Low Risk  (07/15/2022)  Transportation Needs: No Transportation Needs (07/15/2022)  Utilities: Not At Risk (07/15/2022)  Tobacco Use: Low Risk  (07/15/2022)    Readmission Risk Interventions     No data to display

## 2022-07-21 MED ORDER — HALOPERIDOL LACTATE 5 MG/ML IJ SOLN
2.0000 mg | Freq: Once | INTRAMUSCULAR | Status: AC
  Administered 2022-07-21: 2 mg via INTRAMUSCULAR
  Filled 2022-07-21: qty 1
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
## 2022-07-21 NOTE — Progress Notes (Signed)
LCSW Progress Note  [id number]   Heidi Fuentes  07/21/2022  11:41 AM  Description:   Inpatient Psychiatric Referral  Patient was recommended inpatient per Hillery Jacks, NP. There are no available beds at [facility] or [facility] Gero unit, per [facility] Rona Ravens, RN. Patient was referred to the following out of network facilities:   [facility] Provider Address Phone Fax  [facility]  [address] [place] [address] [phone]  [facility] [address]  [address], [place] [address] [phone]  [facility]  [phone] [place] [address] [phone]  [facility]  [address] [place], [place] [address] [phone]  [facility]  [address] [place] [address] [phone]  [facility]  [address] [place] [address] [phone]  [facility]  [address] [place] [address] [phone]  [facility]  [address], [place] [address] [phone]  [facility]  [address], [place] [address] [phone]  [facility]  [address] [place] [address] [phone]  [facility]  [address], [place] [address] [phone]  [facility]  [address] [place] [address] [phone]  [facility]  [address] [place] [address] [phone]  [facility]  [address], [place] [address] [phone]  [facility]  [address] [place] [address] [phone]  [facility]  [address], [place] [address] [phone]  [facility]  [address] [place] [address] [phone]    Situation ongoing, CSW to continue following and update chart as more information becomes available.      Cathie Beams, LCSW  07/21/2022 11:41 AM

## 2022-07-21 NOTE — Progress Notes (Signed)
Attempted to take pts VS and given meds pt refused both

## 2022-07-21 NOTE — Progress Notes (Signed)
Patient seen and evaluated this a.m. and continues to have some aggressive behavior and agitation noted.  Psychiatry following and has started her on Zyprexa daily as well as at bedtime.  They will continue reassessment until patient can go to [facility].  Please refer to discharge summary dictated 6/17 for full details.  Updated son on phone 6/18.  Total care time: 20 minutes.

## 2022-07-21 NOTE — Progress Notes (Signed)
PT has refused all medications today along with vitals. Pt states "I will not take anything from you". Pt gets combative when messed with. Pt also pulled IV out, MD notified.

## 2022-07-21 NOTE — Progress Notes (Signed)
Security called d/t pt being combative and refusing to get in the bed. MD notified, One time dose of Haldol given per order.

## 2022-07-21 NOTE — Progress Notes (Signed)
OT Cancellation Note  Patient Details Name: Heidi Fuentes MRN: [id number] DOB: 07-Jan-1946   Cancelled Treatment:    Reason Eval/Treat Not Completed: Other (comment). Nursing requested to hold pt evaluation due to pt recently getting to sleep and being given Haldol. Will attempt to evaluate pt later as time permits.   Noreta Kue OT, MOT  Danie Chandler 07/21/2022, 9:10 AM

## 2022-07-21 NOTE — Progress Notes (Addendum)
Patient ID: Heidi Fuentes, female   DOB: 1946-01-25, 77 y.o.   MRN: [id number]  This provider attempted to evaluate the patient via video telehealth assessment however,  was reported that patient received Haldol due to aggressive behavior and increased agitation earlier this morning.  Patient observed resting in bed stated " no, no, no."  Per nursing staff patient has been refusing Zyprexa Zydis. Patient is currently prescribed Zyprexa 2.5 mg daily and Zyprexa 5mg  po at bedtime.  Will attempt to reassess patient.  CSW to continue seeking inpatient admission.

## 2022-07-22 ENCOUNTER — Encounter (HOSPITAL_COMMUNITY): Payer: Self-pay | Admitting: Internal Medicine

## 2022-07-22 DIAGNOSIS — F319 Bipolar disorder, unspecified: Secondary | ICD-10-CM

## 2022-07-22 DIAGNOSIS — F3160 Bipolar disorder, current episode mixed, unspecified: Secondary | ICD-10-CM | POA: Insufficient documentation

## 2022-07-22 DIAGNOSIS — R410 Disorientation, unspecified: Secondary | ICD-10-CM | POA: Diagnosis not present

## 2022-07-22 DIAGNOSIS — R6520 Severe sepsis without septic shock: Secondary | ICD-10-CM | POA: Diagnosis not present

## 2022-07-22 DIAGNOSIS — A419 Sepsis, unspecified organism: Secondary | ICD-10-CM | POA: Diagnosis not present

## 2022-07-22 DIAGNOSIS — F330 Major depressive disorder, recurrent, mild: Secondary | ICD-10-CM | POA: Diagnosis present

## 2022-07-22 MED ORDER — DIVALPROEX SODIUM 125 MG PO CSDR
125.0000 mg | DELAYED_RELEASE_CAPSULE | Freq: Two times a day (BID) | ORAL | Status: DC
  Administered 2022-07-22 – 2022-07-28 (×11): 125 mg via ORAL
  Filled 2022-07-22 (×13): qty 1

## 2022-07-22 MED ORDER — OLANZAPINE 5 MG PO TABS
2.5000 mg | ORAL_TABLET | Freq: Every day | ORAL | Status: DC
  Administered 2022-07-23 – 2022-07-27 (×5): 2.5 mg via ORAL
  Filled 2022-07-22 (×5): qty 1

## 2022-07-22 MED ORDER — OLANZAPINE 10 MG IM SOLR
2.5000 mg | Freq: Every day | INTRAMUSCULAR | Status: DC
  Filled 2022-07-22 (×7): qty 10

## 2022-07-22 MED ORDER — BENZTROPINE MESYLATE 1 MG PO TABS
0.5000 mg | ORAL_TABLET | Freq: Two times a day (BID) | ORAL | Status: DC
  Administered 2022-07-22 – 2022-07-25 (×5): 0.5 mg via ORAL
  Filled 2022-07-22 (×7): qty 1

## 2022-07-22 MED ORDER — HALOPERIDOL LACTATE 5 MG/ML IJ SOLN: 2.0000 mg | Freq: Two times a day (BID) | INTRAMUSCULAR | Status: DC

## 2022-07-22 MED ORDER — BENZTROPINE MESYLATE 1 MG/ML IJ SOLN
0.5000 mg | Freq: Two times a day (BID) | INTRAMUSCULAR | Status: DC
  Administered 2022-07-22: 0.5 mg via INTRAMUSCULAR
  Filled 2022-07-22 (×11): qty 0.5

## 2022-07-22 NOTE — Progress Notes (Signed)
PT has not voided this shift, Bladder scan showed 340. This nurse and several other staff members tried to convince her to go to the bathroom and pt refused. MD notified.

## 2022-07-22 NOTE — Progress Notes (Signed)
OT Cancellation Note  Patient Details Name: Heidi Fuentes MRN: [id number] DOB: Sep 03, 1945   Cancelled Treatment:    Reason Eval/Treat Not Completed: Other (comment);Patient declined, no reason specified. Several attempts at bed mobility with pt declining to participate. Will attempt evaluation later as time permits.   Ambry Dix OT, MOT   Danie Chandler 07/22/2022, 9:20 AM

## 2022-07-22 NOTE — Progress Notes (Signed)
Patient seen and examined; still intermittently refusing oral medications.  Poor insight and per nursing report intermittent confusion.  Stable vital signs, no fever, no chest pain, no nausea or vomiting.  Psychiatry service recommended inpatient therapy and at the moment looking her medications will be transition to intramuscular route as she is refusing then orally.  Continue monitoring telemetry.  Refer to discharge summary written on 07/20/2022 for further info/details of anticipated plan of care.  Heidi Loll MD [phone]

## 2022-07-22 NOTE — BH Assessment (Signed)
Disposition Note (Update):  The [facility] confirmed that [facility] has no beds available at this time. Patient referred out to alternative hospitals for consideration of placement. Patient is under review at the following facilities:  Destination  Service Provider Request Status Selected Services Address Phone Fax Patient Preferred  [facility]  Pending - Request Sent N/A [address], [place] [address] [phone] --  [facility]  Pending - Request Sent N/A [address], [place] [address] [phone] --  [facility]  Pending - Request Sent N/A [phone] [place] [address] [phone] --  [facility]  Pending - Request Sent N/A [address] [place], [place] [address] [phone] --  [facility]  Pending - Request Sent N/A [address] [place] [address] [phone] --  [facility]  Pending - Request Sent N/A [address] [place] [address] [phone] --  [facility]  Pending - Request Sent N/A [address] [place] [address] [phone] --  [facility]  Pending - Request Sent N/A [address] [place] [address] [phone] --  [facility]  Pending - Request Sent N/A [address], [place] [address] [phone] --  [facility]  Pending - Request Sent N/A [address] [place] [address] [phone] --  [facility]  Pending - Request Sent N/A [address] [place], [place] [address] [phone] --  [facility]  Pending - Request Sent N/A [address] [place] [address] [phone] --  [facility]  Pending - Request Sent N/A [address], [place] [address] [phone] --  [facility]  Pending - Request Sent N/A [address] [place] [address] [phone] --  [facility]  Pending - Request Sent N/A [address] [place] [address] [phone] --  [facility]  Pending - Request Sent N/A [address], [place] [address] [phone] --  [facility]  Pending - Request Sent NA [address] [place] [address] [phone] --

## 2022-07-22 NOTE — Progress Notes (Signed)
Pt continues to refuse meds and personal care.

## 2022-07-22 NOTE — BH Assessment (Addendum)
Disposition Note: Patient was recommended inpatient per Assunta Found, NP.  [facility] Rona Ravens, RN made aware of patient's bed needs and will review for a possible bed on the [facility] unit.

## 2022-07-22 NOTE — Progress Notes (Signed)
PT is refusing all PO medications at this time. Multiple attempts have been made. Pt was given IM.

## 2022-07-22 NOTE — Consult Note (Signed)
Telepsych Consultation   Reason for Consult:  delirium versus psychiatric issues Referring Physician:  Erick Blinks, DO  Location of Patient: [facility]DEPT 300 Location of Provider: Other: [facility]  Patient Identification: Heidi Fuentes MRN:  [id number] Principal Diagnosis: Severe sepsis (HCC) Diagnosis:  Principal Problem:   Severe sepsis (HCC) Active Problems:   Essential hypertension   UTI (urinary tract infection)   Acute metabolic encephalopathy   Total Time spent with patient: 1 hour  Subjective:   Heidi Fuentes 77 y/o female patient with a psychiatric history of bipolar disorder and schizophrenia; a medical history of hypothyroidism, hypertension, asthma, and chronic pain.  Patient initially presented to [facility] emergency room 07/15/2022 via EMS with complaints of altered mental status.  Patient last known well was 07/12/2022 (Saturday prior to her admission).  On day of admission patient son reported he had called0 07/15/2022 and patient not answering normally informed at baseline she is normally slightly confused.  States that patient did not recognize him, talking to clock and her hand.  Family reported to patient's son that on Sunday 07/13/2022 that patient grew more confused and upset and day progressed.     Patient admitted for inpatient medical treatment related sever sepsis most likely related to urinary tract infection, and dehydration.  Noted altered mental status (confusion) most likely delirium caused by infection and dehydration.     UTI has resolved but continued to present with delirium versus psychiatric issues.  Initial psychiatric assessment 06/016/2024 with recommendations to hold Wellbutrin and Prozac which has since been discontinued.  Started Zyprexa 2. 5 mg daily and 5 mg daily at bedtime.  Patient has refused every dose of Zyprexa (none given since ordered).  Patient did receive Haldol 2 mg IM 07/21/2022 for agitation.  There has been no noted improvement on delirium  versus psychiatric issues or agitation that seems to worsen as the day progresses or at night.   During chart review not noted history of schizophrenia.  There is a history of major depressive disorder and bipolar disorder with recent worsening of seasonal depressive symptoms that had no improvement with dose changes.  Patient seen by PCP (Dr. Orpah Cobb and a psychiatric referral done on 07/02/2022 related to polypharmacy of psychotropic medications that she has been managed on for many years.    HPI:  Heidi Fuentes seen virtually via tele psych by this provider for psychiatric reassessment, chart reviewed, and consulted with Dr. Nelly Rout on 07/22/22.  On evaluation Heidi Fuentes sitting up in bed in hospital gown.  She covers her face with her arm so that provider can't see face.  She states that she doesn't want to look at provider and doesn't want provider to see her.  She refuses to answer most questions and when she does speech is tangential.  Patient is oriented to self, she is aware that she is in hospital but when asked name of hospital she stated "Don't you know."  Patient was able to give the correct city, state, and current year but refused to answer any other questions.  We asked if she was eating okay she states "What about you, you eating okay.  I'll eat if they bring me a tray.  Whey they bring me a tray, take it away."  Patient asked her age and states "Look at the top of my head can't you see the gray."  But never gave men her age.  Patient has been refusing to take her PO medications asked  if she would be willing to take her medications via injection and she state "yeah I might be interested in that."  Sitter at bedside and reports most conversations with patient goes the same.  States that confusion, and agitation worsens as the day progresses.  Patient nurse states that patient has been refusing everything and ofter when speaking it doesn't make any sense.       During evaluation  patient is calm, there is no noted distress. Patient spoke in a clear tone at moderate volume, and normal pace.  Objectively:  there is no evidence of psychosis/mania.   Past Psychiatric History: Bipolar disorder, major depressive disorder  Risk to Self:  Denies Risk to Others:  Denies Prior Inpatient Therapy:  Unaware Prior Outpatient Therapy:  Unaware  Past Medical History:  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[facility])     Past Surgical History:  Procedure Laterality Date   CATARACT EXTRACTION W/PHACO Right 10/06/2016   Procedure: CATARACT EXTRACTION PHACO AND INTRAOCULAR LENS PLACEMENT (IOC);  Surgeon: Galen Manila, MD;  Location: [facility] ORS;  Service: Ophthalmology;  Laterality: Right;  [place] 00:32.5 AP% 14.5 CDE 4.71 Fluid pack lot # [id number]   CATARACT EXTRACTION W/PHACO Left 11/03/2016   Procedure: CATARACT EXTRACTION PHACO AND INTRAOCULAR LENS PLACEMENT (IOC);  Surgeon: Galen Manila, MD;  Location: [facility] ORS;  Service: Ophthalmology;  Laterality: Left;  [place] 00:36.6 AP% 16.0 CDE 5.86 Fluid Pack lot # [id number]   COCCYX REMOVAL     colonoscopy  2005   Dr. Karilyn Cota: mild melanosis coli, otherwise normal   COLONOSCOPY WITH PROPOFOL N/A 09/14/2013   XBJ:YNWGNFAOZ coli. Colonic diverticulosis. Single colonic. Tubular adenoma. Next TCS 09/2020.   COLONOSCOPY WITH PROPOFOL N/A 07/24/2021   Procedure: COLONOSCOPY WITH PROPOFOL;  Surgeon: Corbin Ade, MD;   Location: [facility] END[facility] SUITE;  Service: Endoscopy;  Laterality: N/A;  2:00pm   ERCP  1997   [facility]: biliary manometry abnormal, subsequent sphincterotomy    ESOPHAGEAL MANOMETRY N/A 12/21/2016   Surgeon: Napoleon Form, MD; EG junction outflow obstruction which could be secondary to large hiatal hernia and possible coiling of catheter.  Not consistent with achalasia or other variant.  No major peristaltic motility disorder.   ESOPHAGOGASTRODUODENOSCOPY (EGD) WITH PROPOFOL N/A 09/14/2013   HYQ:MVHQIONG'E ring. Hiatal hernia. Status post Elease Hashimoto and biopsy disruption.    FOOT SURGERY     X2   HEMORROIDECTOMY     HERNIA REPAIR     inguinal right   HERNIA REPAIR     LAPAROSCOPIC PARAESOPHAGEAL HERNIA REPAIR  02/2017   Dr. Carolynn Sayers [facility])   [place] DILATION N/A 09/14/2013   Procedure: Alvy Beal;  Surgeon: Corbin Ade, MD;  Location: [facility] ORS;  Service: Endoscopy;  Laterality: N/A;  56   POLYPECTOMY N/A  09/14/2013   Procedure: POLYPECTOMY;  Surgeon: Corbin Ade, MD;  Location: [facility] ORS;  Service: Endoscopy;  Laterality: N/A;   POLYPECTOMY  07/24/2021   Procedure: POLYPECTOMY;  Surgeon: Corbin Ade, MD;  Location: [facility];  Service: Endoscopy;;   TONSILLECTOMY     Family History:  Family History  Problem Relation Age of Onset   Lung cancer Mother    Heart attack Father    Bone cancer Brother    COPD Sister    Colon cancer Neg Hx    Family Psychiatric  History:  Family History  Problem Relation Age of Onset   Lung cancer Mother    Heart attack Father    Bone cancer Brother    COPD Sister    Colon cancer Neg Hx     Social History:  Social History   Substance and Sexual Activity  Alcohol Use No     Social History   Substance and Sexual Activity  Drug Use No    Social History   Socioeconomic History   Marital status: Divorced    Spouse name: Not on file   Number of children: Not on file   Years of education: Not on file   Highest education  level: Not on file  Occupational History   Not on file  Tobacco Use   Smoking status: Never   Smokeless tobacco: Never   Tobacco comments:    NEVER SMOKED  Vaping Use   Vaping Use: Never used  Substance and Sexual Activity   Alcohol use: No   Drug use: No   Sexual activity: Not on file  Other Topics Concern   Not on file  Social History Narrative   Not on file   Social Determinants of Health   Financial Resource Strain: Not on file  Food Insecurity: No Food Insecurity (07/15/2022)   Hunger Vital Sign    Worried About Running Out of Food in the Last Year: Never true    Ran Out of Food in the Last Year: Never true  Transportation Needs: No Transportation Needs (07/15/2022)   PRAPARE - Administrator, Civil Service (Medical): No    Lack of Transportation (Non-Medical): No  Physical Activity: Not on file  Stress: Not on file  Social Connections: Not on file   Additional Social History:    Allergies:   Allergies  Allergen Reactions   Abilify [Aripiprazole] Other (See Comments)    Bad dreams   Geodon [Ziprasidone Hcl] Other (See Comments)    hospitalization specifics   Quetiapine Palpitations   Sulfa Antibiotics Rash   Trazodone And Nefazodone Other (See Comments)    Reaction is unknown    Labs: No results found for this or any previous visit (from the past 48 hour(s)).  Medications:  Current Facility-Administered Medications  Medication Dose Route Frequency Provider Last Rate Last Admin   acetaminophen (TYLENOL) tablet 650 mg  650 mg Oral Q6H PRN Emokpae, Ejiroghene E, MD       Or   acetaminophen (TYLENOL) suppository 650 mg  650 mg Rectal Q6H PRN Emokpae, Ejiroghene E, MD       albuterol (PROVENTIL) (2.5 MG/3ML) 0.083% nebulizer solution 2.5 mg  2.5 mg Nebulization Q6H PRN Emokpae, Ejiroghene E, MD       enoxaparin (LOVENOX) injection 40 mg  40 mg Subcutaneous Q24H Emokpae, Ejiroghene E, MD   40 mg at 07/19/22 2214   gabapentin (NEURONTIN) capsule 300  mg  300 mg Oral QHS Sherryll Burger,  Pratik D, DO   300 mg at 07/19/22 2215   hydrALAZINE (APRESOLINE) injection 10 mg  10 mg Intravenous Q4H PRN Maurilio Lovely D, DO   10 mg at 07/17/22 2202   lamoTRIgine (LAMICTAL) tablet 200 mg  200 mg Oral BID Maurilio Lovely D, DO   200 mg at 07/19/22 2215   latanoprost (XALATAN) 0.005 % ophthalmic solution 1 drop  1 drop Both Eyes QHS Emokpae, Ejiroghene E, MD   1 drop at 07/19/22 2218   levothyroxine (SYNTHROID) tablet 50 mcg  50 mcg Oral Daily Maurilio Lovely D, DO   50 mcg at 07/18/22 1359   liver oil-zinc oxide (DESITIN) 40 % ointment   Topical Daily Maurilio Lovely D, DO   Given at 07/19/22 1610   losartan (COZAAR) tablet 25 mg  25 mg Oral Daily Sherryll Burger, Pratik D, DO   25 mg at 07/19/22 9604   metoprolol tartrate (LOPRESSOR) tablet 12.5 mg  12.5 mg Oral BID Maurilio Lovely D, DO   12.5 mg at 07/19/22 2216   mometasone-formoterol (DULERA) 200-5 MCG/ACT inhaler 2 puff  2 puff Inhalation BID PRN Sherryll Burger, Pratik D, DO       montelukast (SINGULAIR) tablet 10 mg  10 mg Oral Daily Sherryll Burger, Pratik D, DO   10 mg at 07/19/22 0902   OLANZapine zydis (ZYPREXA) disintegrating tablet 2.5 mg  2.5 mg Oral Daily Oneta Rack, NP       And   OLANZapine zydis (ZYPREXA) disintegrating tablet 5 mg  5 mg Oral QHS Lewis, Jerene Pitch, NP       ondansetron (ZOFRAN) tablet 4 mg  4 mg Oral Q8H PRN Emokpae, Ejiroghene E, MD       Or   ondansetron (ZOFRAN) injection 4 mg  4 mg Intravenous Q8H PRN Emokpae, Ejiroghene E, MD       pantoprazole (PROTONIX) EC tablet 40 mg  40 mg Oral Daily Sherryll Burger, Pratik D, DO   40 mg at 07/19/22 0901   umeclidinium bromide (INCRUSE ELLIPTA) 62.5 MCG/ACT 1 puff  1 puff Inhalation Daily PRN Maurilio Lovely D, DO        Musculoskeletal: Strength & Muscle Tone: within normal limits Gait & Station:  Did not see patient ambulate Patient leans: N/A          Psychiatric Specialty Exam:  Presentation  General Appearance:  Appropriate for Environment  Eye  Contact: Minimal  Speech: Clear and Coherent  Speech Volume: Normal  Handedness: Right   Mood and Affect  Mood: Labile  Affect: Appropriate   Thought Process  Thought Processes: Coherent; Irrevelant  Descriptions of Associations:Tangential  Orientation:Partial (Patient is aware that she is in the hospital, city, year, and name, but refuses to answer other questions)  Thought Content:Tangential  History of Schizophrenia/Schizoaffective disorder:No data recorded Duration of Psychotic Symptoms:No data recorded Hallucinations:Hallucinations: None  Ideas of Reference:None  Suicidal Thoughts:Suicidal Thoughts: No  Homicidal Thoughts:Homicidal Thoughts: No   Sensorium  Memory: Immediate Fair; Recent Fair  Judgment: Poor  Insight: Lacking   Executive Functions  Concentration: Fair  Attention Span: Fair  Recall: Poor (Related to refusing to answer most questions to recall memory)  Fund of Knowledge: Fair  Language: Good   Psychomotor Activity  Psychomotor Activity: Psychomotor Activity: Normal   Assets  Assets: Communication Skills; Desire for Improvement; Financial Resources/Insurance; Housing; Social Support   Sleep  Sleep: Sleep: Fair    Physical Exam: Physical Exam Vitals and nursing note reviewed.  Constitutional:      General:  She is not in acute distress.    Appearance: Normal appearance. She is not ill-appearing.  HENT:     Head: Normocephalic.  Cardiovascular:     Rate and Rhythm: Normal rate.  Pulmonary:     Effort: Pulmonary effort is normal. No respiratory distress.  Neurological:     Mental Status: She is alert.     Comments: Oriented to self and place   Psychiatric:        Attention and Perception: Perception normal. She does not perceive visual hallucinations.        Mood and Affect: Affect is labile.        Speech: Speech normal.        Behavior: Behavior normal.        Thought Content: Thought content  is not paranoid. Thought content does not include homicidal or suicidal ideation.        Judgment: Judgment is impulsive.    Review of Systems  Constitutional:        No complaints voiced  Psychiatric/Behavioral:  Negative for hallucinations, substance abuse and suicidal ideas. Depression: Chronic history of depression.The patient is nervous/anxious.   All other systems reviewed and are negative.  Blood pressure (!) 144/84, pulse 80, temperature 98.6 F (37 C), temperature source Oral, resp. rate 18, height 5' (1.524 m), weight 64 kg, SpO2 98 %. Body mass index is 27.56 kg/m.  Treatment Plan Summary: Daily contact with patient to assess and evaluate symptoms and progress in treatment, Medication management, and Plan Continue to seek placement for inpatient psychiatric treatment  EKG results: 07/20/2022 Vent. rate 76 BPM PR interval 118 ms QRS duration 72 ms QT/QTcB 416/468 ms P-R-T axes 69 41 41 Normal sinus rhythm Normal ECG No previous ECGs available Confirmed by Carylon Perches [phone]) on 07/21/2022 6:57:12 AM  Medication Management:  Medication adjustment:  Zyprexa 2.5 Q morning and 5 mg Q hs changed to PO or IM also added Depakote Sprinkles 125 mg Bid.  Cogentin 0.5 mg PO or IM Bid.  Closely monitor EKG related to prolong QTc.    Disposition: Recommend psychiatric Inpatient admission when medically cleared.  This service was provided via telemedicine using a 2-way, interactive audio and video technology.  Names of all persons participating in this telemedicine service and their role in this encounter. Name: Sindee Stucker Role: NP  Name: Zada Girt Role: Patient  Name:  Role:   Name:  Role:    Secure message sent to Dr. Vassie Loll informing: Since patient is not taking any of her medications by mouth made medication adjustment:  Zyprexa 2.5 Q morning and 5 mg Q hs changed to PO or IM also added Depakote Sprinkles 125 mg Bid.  Cogentin 0.5 mg PO or IM Bid.  Will continue to  seek inpatient psychiatric treatment.  Feel that this is more related to delirium and she just not to her baseline, hopefully if we can get some medicine in her she may clear up but until then will continue to seek appropriate bed for her.  Also want to keep eye on EKG related to prolong QTc if she is taking psychotropic medications.      Dorsie Burich, NP 07/22/2022 11:34 AM

## 2022-07-23 DIAGNOSIS — A419 Sepsis, unspecified organism: Secondary | ICD-10-CM | POA: Diagnosis not present

## 2022-07-23 DIAGNOSIS — F3160 Bipolar disorder, current episode mixed, unspecified: Secondary | ICD-10-CM | POA: Diagnosis not present

## 2022-07-23 DIAGNOSIS — N3000 Acute cystitis without hematuria: Secondary | ICD-10-CM | POA: Diagnosis not present

## 2022-07-23 DIAGNOSIS — R410 Disorientation, unspecified: Secondary | ICD-10-CM | POA: Diagnosis not present

## 2022-07-23 MED ORDER — CLOTRIMAZOLE 1 % VA CREA
1.0000 | TOPICAL_CREAM | Freq: Every day | VAGINAL | Status: DC
  Filled 2022-07-23: qty 21

## 2022-07-23 MED ORDER — CLOTRIMAZOLE 1 % VA CREA
1.0000 | TOPICAL_CREAM | Freq: Every day | VAGINAL | Status: DC
  Administered 2022-07-25 – 2022-07-28 (×3): 1 via VAGINAL
  Filled 2022-07-23: qty 45

## 2022-07-23 NOTE — Progress Notes (Signed)
 from bladder scan , has not voided this shift

## 2022-07-23 NOTE — TOC Progression Note (Signed)
Transition of Care [facility]) - Progression Note    Patient Details  Name: Heidi Fuentes MRN: [id number] Date of Birth: March 15, 1945  [facility] CM/SW Contact  Annice Needy, LCSW Phone Number: 07/23/2022, 2:43 PM  Clinical Narrative:    Patient awaiting [facility] bed.    Expected Discharge Plan: Home w Home Health Services Barriers to Discharge: Continued Medical Work up  Expected Discharge Plan and Services       Living arrangements for the past 2 months: Single Family Home Expected Discharge Date: 07/20/22                                     Social Determinants of Health (SDOH) Interventions SDOH Screenings   Food Insecurity: No Food Insecurity (07/15/2022)  Housing: Low Risk  (07/15/2022)  Transportation Needs: No Transportation Needs (07/15/2022)  Utilities: Not At Risk (07/15/2022)  Tobacco Use: Low Risk  (07/22/2022)    Readmission Risk Interventions     No data to display

## 2022-07-23 NOTE — Evaluation (Signed)
Occupational Therapy Evaluation Patient Details Name: Heidi Fuentes MRN: [id number] DOB: Jun 09, 1945 Today's Date: 07/23/2022   History of Present Illness Heidi Fuentes is a 77 y.o. female with medical history significant for asthma, bipolar disorder, chronic pain, hypertension, bronchiectasis.  Patient was brought to the ED via EMS reports of altered mental status.  Patient was last known normal on Saturday.  Her son called this morning but she was not answering normally, at baseline patient has slight confusion.  Son reports that patient was talking to the clock, talking to her hand, and did not recognize him.   At the time of my evaluation, patient is slightly somnolent, with eyes closed, but responding to some some directions, splinting appropriately to 1 question only.   Clinical Impression   Pt agreeable to OT evaluation. Pt sleeping but able to wake and complete bed mobility and standing. Pt has history of being a poor historian. History was taken from previous documentation. Pt required mod to max A for bed mobility and min to mod A for sit to stand and short distance ambulation from EOB forward and backward. Pt demonstrates general weakness with mild limitation in shoulder A/ROM bilaterally. Pt left in the bed with nursing present. Pt will benefit from continued OT in the hospital and recommended venue below to increase strength, balance, and endurance for safe ADL's.         Recommendations for follow up therapy are one component of a multi-disciplinary discharge planning process, led by the attending physician.  Recommendations may be updated based on patient status, additional functional criteria and insurance authorization.   Assistance Recommended at Discharge Frequent or constant Supervision/Assistance  Patient can return home with the following A little help with walking and/or transfers;A lot of help with bathing/dressing/bathroom;Assistance with cooking/housework;Direct  supervision/assist for medications management;Assist for transportation;Help with stairs or ramp for entrance    Functional Status Assessment  Patient has had a recent decline in their functional status and demonstrates the ability to make significant improvements in function in a reasonable and predictable amount of time.  Equipment Recommendations  None recommended by OT    Recommendations for Other Services       Precautions / Restrictions Precautions Precautions: Fall Restrictions Weight Bearing Restrictions: No      Mobility Bed Mobility Overal bed mobility: Needs Assistance Bed Mobility: Supine to Sit, Sit to Supine     Supine to sit: Mod assist Sit to supine: Mod assist, Max assist   General bed mobility comments: slow labored movement    Transfers Overall transfer level: Needs assistance Equipment used: Rolling walker (2 wheels) Transfers: Sit to/from Stand, Bed to chair/wheelchair/BSC Sit to Stand: Min assist, Mod assist     Step pivot transfers: Min assist, Mod assist     General transfer comment: Slow labored movement.      Balance Overall balance assessment: Needs assistance Sitting-balance support: Feet supported, Bilateral upper extremity supported Sitting balance-Leahy Scale: Fair Sitting balance - Comments: fair to good seated at EOB   Standing balance support: During functional activity, Reliant on assistive device for balance, Bilateral upper extremity supported Standing balance-Leahy Scale: Poor Standing balance comment: poor to fair with RW                           ADL either performed or assessed with clinical judgement   ADL Overall ADL's : Needs assistance/impaired     Grooming: Minimal assistance;Standing   Upper Body Bathing:  Min guard;Sitting;Minimal assistance   Lower Body Bathing: Moderate assistance;Sitting/lateral leans;Maximal assistance   Upper Body Dressing : Min guard;Minimal assistance;Sitting   Lower  Body Dressing: Moderate assistance;Maximal assistance;Sitting/lateral leans   Toilet Transfer: Moderate assistance;Minimal assistance;Ambulation;Rolling walker (2 wheels) Toilet Transfer Details (indicate cue type and reason): Simulated via sit to stand and a few steps from and back to EOB with RW Toileting- Clothing Manipulation and Hygiene: Moderate assistance;Maximal assistance;Bed level;Sitting/lateral lean       Functional mobility during ADLs: Minimal assistance;Rolling walker (2 wheels);Moderate assistance General ADL Comments: Pt able to ambulate a few steps forward and backward with RW.     Vision   Additional Comments: Not formally assessed. Unsure at this time. Pt has history of being poor historian.     Perception     Praxis      Pertinent Vitals/Pain Pain Assessment Pain Assessment: Faces Faces Pain Scale: Hurts little more Pain Location: "all over" Pain Descriptors / Indicators: Discomfort Pain Intervention(s): Limited activity within patient's tolerance, Monitored during session, Repositioned     Hand Dominance Right   Extremity/Trunk Assessment Upper Extremity Assessment Upper Extremity Assessment: Generalized weakness (limited to ~75% of avaialble A/ROM during supine assessment.)   Lower Extremity Assessment Lower Extremity Assessment: Defer to PT evaluation   Cervical / Trunk Assessment Cervical / Trunk Assessment: Kyphotic   Communication Communication Communication: No difficulties   Cognition Arousal/Alertness: Awake/alert Behavior During Therapy: WFL for tasks assessed/performed Overall Cognitive Status: No family/caregiver present to determine baseline cognitive functioning                                                        Home Living Family/patient expects to be discharged to:: Private residence Living Arrangements: Alone Available Help at Discharge: Family;Available PRN/intermittently Type of Home: House Home  Access: Ramped entrance     Home Layout: One level     Bathroom Shower/Tub: Chief Strategy Officer: Standard Bathroom Accessibility: Yes   Home Equipment: Agricultural consultant (2 wheels);Cane - single point;Wheelchair - manual   Additional Comments: patient is poor historian - info from previous admission      Prior Functioning/Environment Prior Level of Function : Independent/Modified Independent             Mobility Comments: household and short distanced community ambulator using [facility] ADLs Comments: Independent (per available documentation)        OT Problem List: Decreased strength;Decreased range of motion;Decreased activity tolerance;Impaired balance (sitting and/or standing);Decreased cognition;Pain      OT Treatment/Interventions: Self-care/ADL training;Therapeutic exercise;Therapeutic activities;Patient/family education;Balance training;Cognitive remediation/compensation    OT Goals(Current goals can be found in the care plan section) Acute Rehab OT Goals Patient Stated Goal: get back to bed (pt is a poor historian) OT Goal Formulation: With patient Time For Goal Achievement: 08/06/22 Potential to Achieve Goals: Good  OT Frequency: Min 2X/week                                   End of Session Equipment Utilized During Treatment: Rolling walker (2 wheels)  Activity Tolerance: Patient tolerated treatment well Patient left: in bed;with call bell/phone within reach;with bed alarm set;with nursing/sitter in room  OT Visit Diagnosis: Unsteadiness on feet (R26.81);Other abnormalities of gait and mobility (R26.89);Muscle weakness (  generalized) (M62.81);Other symptoms and signs involving cognitive function                Time: 1030-1039 OT Time Calculation (min): 9 min Charges:  OT General Charges $OT Visit: 1 Visit OT Evaluation $OT Eval Low Complexity: 1 Low  Rondell Frick OT, MOT   Danie Chandler 07/23/2022, 10:50 AM

## 2022-07-23 NOTE — Progress Notes (Signed)
   07/23/22 2046  Provider Notification  Provider Name/Title Zierle  Date Provider Notified 07/23/22  Time Provider Notified 2040  Method of Notification Page  Notification Reason Red med refusal  Provider response No new orders  Date of Provider Response 07/23/22

## 2022-07-23 NOTE — Progress Notes (Signed)
Patient with more than in her bladder , in and out x 2 . Second time used foley only for in and out purpose, 1000cc of amber colored urine returned to bag, Charna Elizabeth , RN catherized and this nurse witnessed. NT Brooke at bedside to assist. Will continue to monitor urine output

## 2022-07-23 NOTE — Progress Notes (Signed)
For the most part medically stable and in no acute distress; found sleeping, calm and resting on today's examination.  Positive urinary retention status post In-N-Out catheterization.  Findings suggesting yeast infection with swelling, redness and white discharge appreciated.  Treatment with vaginal clotrimazole has been started.  No fever, no chest pain, no nausea, no vomiting, good saturation on room air.  Intermittently continued to refuse some of oral medications and demonstrated poor insight.  No abnormalities on telemetry seen.  Plan: -Continue treatment and care by psychiatry service recommendation and wait for bed availability in order to pursue further psych treatment. -Maintain adequate hydration and continue treatment for vaginal yeast. -Continue constant reorientation and supportive care.  Vassie Loll MD [phone]

## 2022-07-23 NOTE — Progress Notes (Signed)
Patient has slept most of the shift, very poor appetite , remains confused, no c/o pain or discomfort, she did take her medications this morning and was given PRN tylenol for bilateral knee pain . Patient was able to ambulate to restroom with 1 assist however she would not sit on toilet for long enough to void and was confused about what she was doing there. Redirected to get back in bed and patient was then catherized at 1153 , new orders started for yeast and redness to patients labia, puss also noted upon catherization. Patient has not voided since 1153am . Will continue to monitor for changes and bladder scan if needed.

## 2022-07-23 NOTE — Plan of Care (Signed)
  Problem: Acute Rehab OT Goals (only OT should resolve) Goal: Pt. Will Perform Grooming Flowsheets (Taken 07/23/2022 1052) Pt Will Perform Grooming:  with modified independence  standing Goal: Pt. Will Perform Upper Body Bathing Flowsheets (Taken 07/23/2022 1052) Pt Will Perform Upper Body Bathing:  with modified independence  sitting Goal: Pt. Will Perform Upper Body Dressing Flowsheets (Taken 07/23/2022 1052) Pt Will Perform Upper Body Dressing:  with modified independence  sitting Goal: Pt. Will Perform Lower Body Dressing Flowsheets (Taken 07/23/2022 1052) Pt Will Perform Lower Body Dressing:  with min guard assist  sitting/lateral leans Goal: Pt. Will Transfer To Toilet Flowsheets (Taken 07/23/2022 1052) Pt Will Transfer to Toilet:  with min guard assist  ambulating Goal: Pt. Will Perform Toileting-Clothing Manipulation Flowsheets (Taken 07/23/2022 1052) Pt Will Perform Toileting - Clothing Manipulation and hygiene:  with modified independence  sitting/lateral leans Goal: Pt/Caregiver Will Perform Home Exercise Program Flowsheets (Taken 07/23/2022 1052) Pt/caregiver will Perform Home Exercise Program:  Increased ROM  Increased strength  Both right and left upper extremity  Independently  Daisean Brodhead OT, MOT

## 2022-07-24 MED ORDER — HALOPERIDOL LACTATE 5 MG/ML IJ SOLN
5.0000 mg | Freq: Once | INTRAMUSCULAR | Status: AC
  Administered 2022-07-24: 5 mg via INTRAVENOUS
  Filled 2022-07-24: qty 1

## 2022-07-24 MED ORDER — ENSURE ENLIVE PO LIQD
237.0000 mL | Freq: Two times a day (BID) | ORAL | Status: DC
  Administered 2022-07-24 – 2022-07-28 (×2): 237 mL via ORAL

## 2022-07-24 NOTE — Progress Notes (Signed)
Patient refused all oral meds at bedtime, refused lovenox injection as well, Dr. Herma Carson made aware. Pt is still very confused and at times is verbally aggressive towards staff.

## 2022-07-24 NOTE — Progress Notes (Signed)
   07/24/22 1539  Spiritual Encounters  Type of Visit Initial  Care provided to: Patient  Conversation partners present during encounter Nurse  Referral source Chaplain assessment  Reason for visit Routine spiritual support  OnCall Visit No   Heidi Fuentes was referred by Lilli Few to visit Pt. Chap went visit Pt in her room. Pt was alone and awake. When [place] engaged in conversation, Pt shared about her loneliness, due to the absence of her sons. Although Pt was not coherently following a consistent conversation, after a moment of long silence, Chap noticed Pt was feeling sad and asked Pt about her feeling. Pt cried and shared her sadness for not having her sons, her best friend and other family members. Chap addressed Pt feelings of loneliness and sadness with Pt holding a safe space for her. Chap thank Pt for sharing part of her life and left the room. Heidi Fuentes remains available if needed.  [employer]

## 2022-07-24 NOTE — Consult Note (Signed)
Telepsych Consultation   Reason for Consult: Evaluation of bipolar disorder versus delirium has restarted multiple priors psychiatric hospitalizations.  Referring Physician:MD Eloise Harman    Location of Patient: [facility]-DEPT 300 Location of Provider: Other: [facility]  Patient Identification: Heidi Fuentes MRN:  [id number] Principal Diagnosis: Severe sepsis [facility]) Diagnosis:  Principal Problem:   Severe sepsis (HCC) Active Problems:   Essential hypertension   UTI (urinary tract infection)   Acute metabolic encephalopathy   Mixed bipolar I disorder (HCC)   Delirium   Bipolar I disorder (HCC)   Total Time spent with patient: 15 minutes  Subjective:   Heidi Fuentes is a 77 y.o. female was seen and evaluated via video tele-assessment.  She is awake, alert and oriented to self and place.  Continues to present with confusion and is tangential at times.  Needing constant redirection throughout this assessment.  Presents less agitated than on previous assessments.  Chart review patient has been refusing psychotropic medications as indicated.  Zyprexa 2.5 daily and Zyprexa 5 mg nightly.  Patient was ordered Haldol 5 mg nightly.  Previous provider initiated Depakote sprinkles 125 mg twice daily.  Reordered lamotrigine level-pending results. EKG reviewed last results Qtc 416/468. Please continue to monitor.    HPI:  Per initial admission assessment note on the brief narrative: "Heidi Fuentes is a 77 y.o. female with medical history significant for asthma, bipolar disorder, chronic pain, hypertension, bronchiectasis.Patient was brought to the ED via EMS reports of altered mental status.  Patient was admitted for acute metabolic encephalopathy in the setting of severe sepsis likely due to UTI.  UTI has now resolved, but she continues to have some delirium versus psychiatric issues that are persisting.  She will require psychiatric evaluation."   Past Psychiatric History:   Risk to Self:    Risk to Others:   Prior Inpatient Therapy:   Prior Outpatient Therapy:    Past Medical History:  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[facility])     Past Surgical History:  Procedure Laterality Date   CATARACT EXTRACTION W/PHACO Right 10/06/2016   Procedure: CATARACT EXTRACTION PHACO AND INTRAOCULAR LENS PLACEMENT (IOC);  Surgeon: Galen Manila, MD;  Location: [facility] ORS;  Service: Ophthalmology;  Laterality: Right;  [place] 00:32.5 AP% 14.5 CDE 4.71 Fluid pack lot # [id number]   CATARACT EXTRACTION W/PHACO Left 11/03/2016   Procedure: CATARACT EXTRACTION PHACO AND INTRAOCULAR LENS PLACEMENT (IOC);  Surgeon: Galen Manila, MD;  Location: [facility] ORS;  Service: Ophthalmology;  Laterality: Left;  [place] 00:36.6 AP% 16.0 CDE 5.86 Fluid Pack lot # [id number]   COCCYX REMOVAL     colonoscopy  2005   Dr. Karilyn Cota: mild melanosis coli, otherwise normal   COLONOSCOPY WITH PROPOFOL N/A 09/14/2013   QIH:KVQQVZDGL coli. Colonic diverticulosis. Single  colonic. Tubular adenoma. Next TCS 09/2020.   COLONOSCOPY WITH PROPOFOL N/A 07/24/2021   Procedure: COLONOSCOPY WITH PROPOFOL;  Surgeon: Corbin Ade, MD;  Location: [facility] ENDO SUITE;  Service: Endoscopy;  Laterality: N/A;  2:00pm   ERCP  1997   [facility]: biliary manometry abnormal, subsequent sphincterotomy    ESOPHAGEAL MANOMETRY N/A 12/21/2016   Surgeon: Napoleon Form, MD; EG junction outflow obstruction which could be secondary to  large hiatal hernia and possible coiling of catheter.  Not consistent with achalasia or other variant.  No major peristaltic motility disorder.   ESOPHAGOGASTRODUODENOSCOPY (EGD) WITH PROPOFOL N/A 09/14/2013   ZOX:[id number]'[id number] ring. Hiatal hernia. Status post Elease Hashimoto and biopsy disruption.    FOOT SURGERY     X2   HEMORROIDECTOMY     HERNIA REPAIR     inguinal right   HERNIA REPAIR     LAPAROSCOPIC PARAESOPHAGEAL HERNIA REPAIR  02/2017   Dr. Carolynn Sayers [facility])   [place] DILATION N/A 09/14/2013   Procedure: Alvy Beal;  Surgeon: Corbin Ade, MD;  Location: [facility] ORS;  Service: Endoscopy;  Laterality: N/A;  56   POLYPECTOMY N/A 09/14/2013   Procedure: POLYPECTOMY;  Surgeon: Corbin Ade, MD;  Location: [facility] ORS;  Service: Endoscopy;  Laterality: N/A;   POLYPECTOMY  07/24/2021   Procedure: POLYPECTOMY;  Surgeon: Corbin Ade, MD;  Location: [facility] ENDO SUITE;  Service: Endoscopy;;   TONSILLECTOMY     Family History:  Family History  Problem Relation Age of Onset   Lung cancer Mother    Heart attack Father    Bone cancer Brother    COPD Sister    Colon cancer Neg Hx    Family Psychiatric  History:  Social History:  Social History   Substance and Sexual Activity  Alcohol Use No     Social History   Substance and Sexual Activity  Drug Use No    Social History   Socioeconomic History   Marital status: Divorced    Spouse name: Not on file   Number of children: Not on file   Years of education: Not on file   Highest education level: Not on file  Occupational History   Not on file  Tobacco Use   Smoking status: Never   Smokeless tobacco: Never   Tobacco comments:    NEVER SMOKED  Vaping Use   Vaping Use: Never used  Substance and Sexual Activity   Alcohol use: No   Drug use: No   Sexual activity: Not on file  Other Topics Concern   Not on file  Social History Narrative   Not on file   Social Determinants of Health   Financial Resource Strain: Not  on file  Food Insecurity: No Food Insecurity (07/15/2022)   Hunger Vital Sign    Worried About Running Out of Food in the Last Year: Never true    Ran Out of Food in the Last Year: Never true  Transportation Needs: No Transportation Needs (07/15/2022)   PRAPARE - Administrator, Civil Service (Medical): No    Lack of Transportation (Non-Medical): No  Physical Activity: Not on file  Stress: Not on file  Social Connections: Not on file   Additional Social History:    Allergies:   Allergies  Allergen Reactions   Abilify [Aripiprazole] Other (See Comments)    Bad dreams   Geodon [Ziprasidone Hcl] Other (See Comments)    hospitalization specifics   Quetiapine Palpitations   Sulfa Antibiotics Rash  Trazodone And Nefazodone Other (See Comments)    Reaction is unknown    Labs: No results found for this or any previous visit (from the past 48 hour(s)).  Medications:  Current Facility-Administered Medications  Medication Dose Route Frequency Provider Last Rate Last Admin   acetaminophen (TYLENOL) tablet 650 mg  650 mg Oral Q6H PRN Emokpae, Ejiroghene E, MD   650 mg at 07/24/22 1000   Or   acetaminophen (TYLENOL) suppository 650 mg  650 mg Rectal Q6H PRN Emokpae, Ejiroghene E, MD       albuterol (PROVENTIL) (2.5 MG/3ML) 0.083% nebulizer solution 2.5 mg  2.5 mg Nebulization Q6H PRN Emokpae, Ejiroghene E, MD       benztropine (COGENTIN) tablet 0.5 mg  0.5 mg Oral BID Rankin, Shuvon B, NP   0.5 mg at 07/24/22 1000   Or   benztropine mesylate (COGENTIN) injection 0.5 mg  0.5 mg Intramuscular BID Rankin, Shuvon B, NP   0.5 mg at 07/22/22 1420   clotrimazole (GYNE-LOTRIMIN) vaginal cream 1 Applicatorful  1 Applicatorful Vaginal QHS Madueme, Elvira C, RPH       divalproex (DEPAKOTE SPRINKLE) capsule 125 mg  125 mg Oral Q12H Rankin, Shuvon B, NP   125 mg at 07/24/22 1000   enoxaparin (LOVENOX) injection 40 mg  40 mg Subcutaneous Q24H Emokpae, Ejiroghene E, MD   40 mg at 07/22/22  2048   gabapentin (NEURONTIN) capsule 300 mg  300 mg Oral QHS Shah, Pratik D, DO   300 mg at 07/22/22 2328   hydrALAZINE (APRESOLINE) injection 10 mg  10 mg Intravenous Q4H PRN Maurilio Lovely D, DO   10 mg at 07/17/22 2202   lamoTRIgine (LAMICTAL) tablet 200 mg  200 mg Oral BID Sherryll Burger, Pratik D, DO   200 mg at 07/24/22 1000   latanoprost (XALATAN) 0.005 % ophthalmic solution 1 drop  1 drop Both Eyes QHS Emokpae, Ejiroghene E, MD   1 drop at 07/19/22 2218   levothyroxine (SYNTHROID) tablet 50 mcg  50 mcg Oral Daily Maurilio Lovely D, DO   50 mcg at 07/23/22 4098   liver oil-zinc oxide (DESITIN) 40 % ointment   Topical Daily Sherryll Burger, Pratik D, DO   Given at 07/24/22 1002   losartan (COZAAR) tablet 25 mg  25 mg Oral Daily Sherryll Burger, Pratik D, DO   25 mg at 07/24/22 1000   metoprolol tartrate (LOPRESSOR) tablet 12.5 mg  12.5 mg Oral BID Sherryll Burger, Pratik D, DO   12.5 mg at 07/24/22 1000   mometasone-formoterol (DULERA) 200-5 MCG/ACT inhaler 2 puff  2 puff Inhalation BID PRN Sherryll Burger, Pratik D, DO       montelukast (SINGULAIR) tablet 10 mg  10 mg Oral Daily Sherryll Burger, Pratik D, DO   10 mg at 07/24/22 1000   OLANZapine (ZYPREXA) injection 2.5 mg  2.5 mg Intramuscular Daily Rankin, Shuvon B, NP       Or   OLANZapine (ZYPREXA) tablet 2.5 mg  2.5 mg Oral Daily Rankin, Shuvon B, NP   2.5 mg at 07/24/22 0959   ondansetron (ZOFRAN) tablet 4 mg  4 mg Oral Q8H PRN Emokpae, Ejiroghene E, MD       Or   ondansetron (ZOFRAN) injection 4 mg  4 mg Intravenous Q8H PRN Emokpae, Ejiroghene E, MD       pantoprazole (PROTONIX) EC tablet 40 mg  40 mg Oral Daily Shah, Pratik D, DO   40 mg at 07/24/22 1001   umeclidinium bromide (INCRUSE ELLIPTA) 62.5 MCG/ACT 1 puff  1 puff Inhalation Daily PRN Erick Blinks, DO        Musculoskeletal: Tele assessment           Psychiatric Specialty Exam:  Presentation  General Appearance:  -- [facility] gown)  Eye Contact: Good  Speech: Clear and Coherent  Speech  Volume: Normal  Handedness: Right   Mood and Affect  Mood: Anxious; Irritable  Affect: Congruent   Thought Process  Thought Processes: Irrevelant; Linear  Descriptions of Associations:Tangential  Orientation:Partial  Thought Content:Tangential  History of Schizophrenia/Schizoaffective disorder:No data recorded Duration of Psychotic Symptoms:No data recorded Hallucinations:Hallucinations: None  Ideas of Reference:None  Suicidal Thoughts:Suicidal Thoughts: No  Homicidal Thoughts:Homicidal Thoughts: No   Sensorium  Memory: Immediate Poor; Recent Poor; Remote Poor  Judgment: Poor  Insight: Lacking   Executive Functions  Concentration: Poor  Attention Span: Poor  Recall: Poor  Fund of Knowledge: Poor  Language: Fair   Psychomotor Activity  Psychomotor Activity:Psychomotor Activity: Normal   Assets  Assets: Social Support; Desire for Improvement   Sleep  Sleep:Sleep: Fair    Physical Exam: Physical Exam Vitals and nursing note reviewed.  Constitutional:      Appearance: Normal appearance.  Cardiovascular:     Rate and Rhythm: Normal rate and regular rhythm.  Neurological:     Mental Status: She is alert.  Psychiatric:        Mood and Affect: Mood normal.    Review of Systems  Cardiovascular: Negative.   Psychiatric/Behavioral:  Positive for hallucinations. The patient is nervous/anxious and has insomnia.   All other systems reviewed and are negative.  Blood pressure 125/75, pulse 62, temperature 98.1 F (36.7 C), resp. rate 17, height 5' (1.524 m), weight 64 kg, SpO2 98 %. Body mass index is 27.56 kg/m.  Treatment Plan Summary: Daily contact with patient to assess and evaluate symptoms and progress in treatment and Medication management  Disposition: Recommend psychiatric Inpatient admission when medically cleared. Lamictal level pending- Continue Depakote Sprinkles 125 mg po BID  Repeat EKG- medication management    This service was provided via telemedicine using a 2-way, interactive audio and video technology.  Names of all persons participating in this telemedicine service and their role in this encounter. Name: Heidi Fuentes Role: patient   Name: T.Melvyn Neth Role: Nurse Practitioner          Oneta Rack, NP 07/24/2022 10:35 AM

## 2022-07-24 NOTE — Progress Notes (Addendum)
This CSW sent referral to [facility]; [phone], and has attempted to follow up with [facility] Fax [phone]  Phone [phone]. CSW is awaiting follow up Placement for inpatient behavioral health for gero/ med Psych pts remains a barrier. CSW/Disposition team will continue seek placement.  [name], MSW, [facility] 07/24/2022 10:54 PM

## 2022-07-24 NOTE — Plan of Care (Signed)
  Problem: Education: Goal: Knowledge of General Education information will improve Description Including pain rating scale, medication(s)/side effects and non-pharmacologic comfort measures Outcome: Progressing   Problem: Health Behavior/Discharge Planning: Goal: Ability to manage health-related needs will improve Outcome: Progressing   

## 2022-07-24 NOTE — Progress Notes (Signed)
LCSW Progress Note  [id number]   JANIEL DERHAMMER  07/24/2022  10:55 AM  Description:   Inpatient Psychiatric Referral  Patient was recommended inpatient per Hillery Jacks, NP. There are no available beds at [facility] or [facility], per [facility], Charity fundraiser. Patient was referred to the following out of network facilities:   [facility] Provider Address Phone Fax  [facility]  [address], [place] [address] [phone]  [facility] [address]  [address], [place] [address] [phone]  [facility]  [id number] [place] [address] [phone]  [facility]  [address] [place], [place] [address] [phone]  [facility]  [address] [place] [address] [phone]  [facility]  [address], [place] [address] [phone]  [facility]  [address] [place] [address] [phone]  [facility]  [address] [place] [address] [phone]  [facility]  [address], [place] [address] [phone]  [facility]  [address] [place] [address] [phone]  [facility]  [address] [place] [address] [phone]  [facility]  [address] [place] [address] [phone]  [facility]  [address] [place] [address] [phone]  [facility]  [address] [place] [address] [phone]  [facility]  [address] [place] [address] [phone]  [facility]  [address], [place] [address] [phone]  [facility]  [address] [place] [address] [phone]  [facility]  [address] [place] [address] [phone]  [facility]  [address], [place] [address] [phone]  [facility]  [address], [place] [address] [phone]  [facility]  [address] [place] [address] [phone]  [facility]  [address] [place] [address] [phone]    Situation ongoing, CSW to continue following and update chart as more information becomes available.      Cathie Beams, [place]  07/24/2022 10:55 AM

## 2022-07-24 NOTE — Progress Notes (Signed)
  Progress Note   Patient: Heidi Fuentes GNF:[id number] DOB: [birth year] DOA: 07/15/2022     9 DOS: the patient was seen and examined on 07/24/2022   Brief hospital admission narrative course: As per H&P written by Dr. Mariea Clonts on 07/15/2022 Heidi Fuentes is a 77 y.o. female with medical history significant for asthma, bipolar disorder, chronic pain, hypertension, bronchiectasis. Patient was brought to the ED via EMS reports of altered mental status.  Patient was last known normal on Saturday.  Her son called this morning but she was not answering normally, at baseline patient has slight confusion.  Son reports that patient was talking to the clock, talking to her hand, and did not recognize him.  At the time of my evaluation, patient is slightly somnolent, with eyes closed, but responding to some some directions, splinting appropriately to 1 question only.     ED Course: Tmax 98.6.  Respiratory rate 17-20 blood heart rate 119-129.  Blood pressure systolic 130s to 846N.  O2 sats greater than 96% on room air.  WBC 16.1.  UA with large leukocytes rare bacteria.  Head CT negative for acute abnormality. Hospitalist to admit for UTI with altered mental status.  Assessment and Plan: Severe sepsis at this moment resolved; patient has completed antibiotic therapy.  Due to the presence of yeast infection is receiving management with vaginal antifungal suppository (day 2 out of 7).  Continue supportive care, constant reorientation as psychiatric medication as recommended by psychiatry service.  Patient has found in need of placement in psychiatric facility for further management; awaiting bed availability.  She is hemodynamically stable.  Subjective:  No fever, no chest pain, no nausea or vomiting; patient requiring in and out catheter on 07/23/2022 due to isolated episode of urinary retention.  Physical Exam: Vitals:   07/23/22 2028 07/23/22 2030 07/24/22 0538 07/24/22 1349  BP: (!) 141/52 (!) 141/52 125/75  120/75  Pulse: 61 62  66  Resp:  18 17 18   Temp:  97.9 F (36.6 C) 98.1 F (36.7 C) 98.4 F (36.9 C)  TempSrc:    Oral  SpO2:  99% 98% 98%  Weight:      Height:       General exam: Alert, awake, oriented x 1; demonstrating intermittent episodes of confusion and aggressive behavior; at time of my evaluation patient was calm and able to answer questions appropriately. Respiratory system: Clear to auscultation. Respiratory effort normal.  Good saturation on room air. Cardiovascular system:RRR. No murmurs, rubs, gallops. Gastrointestinal system: Abdomen is nondistended, soft and nontender. No organomegaly or masses felt. Normal bowel sounds heard. Central nervous system: Moving 4 limbs are spontaneously; no focal deficit. Extremities: No cyanosis, clubbing or edema. Skin: No petechiae. Psychiatry: Judgement and insight appear impaired secondary to psychiatric underlying condition; tangential interaction and depressed mood..   Data Reviewed: Lamictal level pending.  Family Communication: No family at bedside.  Disposition: Status is: Inpatient Remains inpatient appropriate because: Continue supportive care and psychiatric management.   Planned Discharge Destination: Awaiting placement at behavioral health facility   Time spent: 30 minutes  Author: Vassie Loll, MD 07/24/2022 3:49 PM  For on call review [email or web address].

## 2022-07-24 NOTE — Progress Notes (Signed)
OT Cancellation Note  Patient Details Name: Heidi Fuentes MRN: [id number] DOB: [birth year]   Cancelled Treatment:    Reason Eval/Treat Not Completed: Patient declined, no reason specified. Pt declined therapy today. Pt educated on importance of getting out of bed and limited staff over the weekend. Pt continued to decline therapy. Will attempt to see pt later as time permits.   Dreshawn Hendershott OT, MOT   Danie Chandler 07/24/2022, 9:42 AM

## 2022-07-25 LAB — COMPREHENSIVE METABOLIC PANEL
ALT: 27 U/L (ref 0–44)
AST: 26 U/L (ref 15–41)
Albumin: 3 g/dL — ABNORMAL LOW (ref 3.5–5.0)
Alkaline Phosphatase: 41 U/L (ref 38–126)
Anion gap: 11 (ref 5–15)
BUN: 26 mg/dL — ABNORMAL HIGH (ref 8–23)
CO2: 26 mmol/L (ref 22–32)
Calcium: 8.9 mg/dL (ref 8.9–10.3)
Chloride: 102 mmol/L (ref 98–111)
Creatinine, Ser: 0.96 mg/dL (ref 0.44–1.00)
GFR, Estimated: >60 mL/min (ref >60)
Glucose, Bld: 95 mg/dL (ref 70–99)
Potassium: 3.3 mmol/L — ABNORMAL LOW (ref 3.5–5.1)
Sodium: 139 mmol/L (ref 135–145)
Total Bilirubin: 0.7 mg/dL (ref 0.3–1.2)
Total Protein: 5.8 g/dL — ABNORMAL LOW (ref 6.5–8.1)

## 2022-07-25 LAB — CBC WITH DIFFERENTIAL/PLATELET
Abs Immature Granulocytes: 0.07 10*3/uL (ref 0.00–0.07)
Basophils Absolute: 0 10*3/uL (ref 0.0–0.1)
Basophils Relative: 0 %
Eosinophils Absolute: 0.3 10*3/uL (ref 0.0–0.5)
Eosinophils Relative: 3 %
HCT: 42.6 % (ref 36.0–46.0)
Hemoglobin: 14.1 g/dL (ref 12.0–15.0)
Immature Granulocytes: 1 %
Lymphocytes Relative: 31 %
Lymphs Abs: 3.1 10*3/uL (ref 0.7–4.0)
MCH: 31.5 pg (ref 26.0–34.0)
MCHC: 33.1 g/dL (ref 30.0–36.0)
MCV: 95.1 fL (ref 80.0–100.0)
Monocytes Absolute: 1.1 10*3/uL — ABNORMAL HIGH (ref 0.1–1.0)
Monocytes Relative: 10 %
Neutro Abs: 5.5 10*3/uL (ref 1.7–7.7)
Neutrophils Relative %: 55 %
Platelets: 313 10*3/uL (ref 150–400)
RBC: 4.48 MIL/uL (ref 3.87–5.11)
RDW: 12.9 % (ref 11.5–15.5)
WBC: 10.1 10*3/uL (ref 4.0–10.5)
nRBC: 0 % (ref 0.0–0.2)

## 2022-07-25 LAB — VALPROIC ACID LEVEL: Valproic Acid Lvl: 22 ug/mL — ABNORMAL LOW (ref 50.0–100.0)

## 2022-07-25 MED ORDER — BENZTROPINE MESYLATE 1 MG/ML IJ SOLN: 0.5000 mg | Freq: Two times a day (BID) | INTRAMUSCULAR | Status: DC

## 2022-07-25 MED ORDER — BENZTROPINE MESYLATE 1 MG PO TABS: 2.0000 mg | ORAL_TABLET | Freq: Every day | ORAL | Status: DC

## 2022-07-25 MED ORDER — BENZTROPINE MESYLATE 1 MG PO TABS
2.0000 mg | ORAL_TABLET | Freq: Every day | ORAL | Status: AC
  Administered 2022-07-25: 2 mg via ORAL
  Filled 2022-07-25: qty 2

## 2022-07-25 MED ORDER — BENZTROPINE MESYLATE 1 MG/ML IJ SOLN
1.0000 mg | Freq: Two times a day (BID) | INTRAMUSCULAR | Status: DC
  Filled 2022-07-25 (×8): qty 1

## 2022-07-25 MED ORDER — BENZTROPINE MESYLATE 1 MG PO TABS: 1.0000 mg | ORAL_TABLET | Freq: Two times a day (BID) | ORAL | Status: DC

## 2022-07-25 MED ORDER — BENZTROPINE MESYLATE 1 MG PO TABS
1.0000 mg | ORAL_TABLET | Freq: Two times a day (BID) | ORAL | Status: DC
  Administered 2022-07-25 – 2022-07-28 (×6): 1 mg via ORAL
  Filled 2022-07-25 (×6): qty 1

## 2022-07-25 NOTE — Progress Notes (Signed)
Per Psych provider Rudy Jew TTS/ Psych team will continue assist and follow. PENDING Labs to be reviewed.  This CSW will follow up with Inpatient Behavioral Health referrals. At this time CSW has yet to obtain Med Psych placement for this pt.   [name], MSW, [facility] 07/25/2022 12:54 PM

## 2022-07-25 NOTE — Progress Notes (Signed)
Walked slowly this morning to bathroom with max assist and voided.  Stayed very humped over.  Alert and oriented x 3.  Attempted this afternoon to walk  patient  to bathroom again and could not take steps after max assist to stand.  Attempted for about 15 minutes Stayed very humped over only looking down at floor.  Unable to ambulate and had to be assisted back to bed.  Keeps leaning head over in bed and leaning to right.  Propped on pillows and towels to try to straighten.  Taking crushed pills in choc ice cream and will not eat any of meals , even with staff trying to feed.

## 2022-07-25 NOTE — Progress Notes (Signed)
Writer attempted to feed patient breakfast and lunch with no luck. Pt stated she did not want to eat but did take a few small sips of tea. Rn Harriett Sine aware. Will try again with supper.

## 2022-07-25 NOTE — Progress Notes (Addendum)
[facility] MD Progress Note  07/25/2022 11:42 AM Heidi Fuentes  MRN:  [id number] Subjective: Heidi Fuentes 77 year old Caucasian female presented due to UTI, sepsis and acute metabolic encephalopathy.  Patient has medical history related to hypertension, bronchiolectasis and chronic pain.  Heidi Fuentes was seen and evaluated via teleassessment.  Patient observed resting in bed appears to have limited range of movement.  Per nursing staff patient is able to to ambulate with 1+ assist to the bathroom.  Was reported that patient has been refusing physical therapy.  She is awake, alert and oriented to place and person.  Was charted that patient's baseline is slight confusion was reported that patient has been taking medication as indicated. She is denying suicidal or homicidal ideations.  Patient's thought process has improved compared to initial admission assessment.   Chart reviewed orders placed for CBC, CMP, Depakote level and pending results for lamotrigine level.  Anticipate clearance from psychiatry consults pending lab results.  Patient to be evaluated in person on 07/26/2022.  Patient was initiated on Zyprexa 2.5 mg daily and 5 mg nightly.  Cogentin 0.5 mg p.o. twice daily.  Lamictal 200 mg. consulted with MD Sharman Crate, now dose of cogentin 2 mg and reassess.    Principal Problem: Severe sepsis (HCC) Diagnosis: Principal Problem:   Severe sepsis (HCC) Active Problems:   Essential hypertension   UTI (urinary tract infection)   Acute metabolic encephalopathy   Mixed bipolar I disorder (HCC)   Delirium   Bipolar I disorder (HCC)  Total Time spent with patient: 15 minutes  Past Psychiatric History:   Past Medical History:  Past Medical History:  Diagnosis Date   Arthritis    Asthma    Back pain    Chronic pain syndrome 07/10/2013   Depression    Duodenal papillary stenosis    Dyspnea    Esophageal dysmotility    Fibromyalgia    GERD (gastroesophageal reflux disease)    Glaucoma    H/O wheezing     History of hiatal hernia    History of palpitations    HOH (hard of hearing)    HOH (hard of hearing)    Hypertension    Hypothyroidism    Macular degeneration    Orthopnea    Stenosis, spinal, lumbar    Thyroid disease    Tubular adenoma    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)     Past Surgical History:  Procedure Laterality Date   CATARACT EXTRACTION W/PHACO Right 10/06/2016   Procedure: CATARACT EXTRACTION PHACO AND INTRAOCULAR LENS PLACEMENT (IOC);  Surgeon: Galen Manila, MD;  Location: [facility] ORS;  Service: Ophthalmology;  Laterality: Right;  [place] 00:32.5 AP% 14.5 CDE 4.71 Fluid pack lot # [id number]   CATARACT EXTRACTION W/PHACO Left 11/03/2016   Procedure: CATARACT EXTRACTION PHACO AND INTRAOCULAR LENS PLACEMENT (IOC);  Surgeon: Galen Manila, MD;  Location: [facility] ORS;  Service: Ophthalmology;  Laterality: Left;  [place] 00:36.6 AP% 16.0 CDE 5.86 Fluid Pack lot # [id number]   COCCYX REMOVAL     colonoscopy  2005   Dr. Karilyn Cota: mild melanosis coli, otherwise normal   COLONOSCOPY WITH PROPOFOL N/A 09/14/2013   SWF:UXNATFTDD coli. Colonic diverticulosis. Single colonic. Tubular adenoma. Next TCS 09/2020.   COLONOSCOPY WITH PROPOFOL N/A 07/24/2021   Procedure: COLONOSCOPY WITH PROPOFOL;  Surgeon: Corbin Ade, MD;  [facility];  Service: Endoscopy;  Laterality: N/A;  2:00pm   ERCP  1997   [facility]: biliary manometry abnormal, subsequent sphincterotomy    ESOPHAGEAL MANOMETRY N/A 12/21/2016  Surgeon: Napoleon Form, MD; EG junction outflow obstruction which could be secondary to large hiatal hernia and possible coiling of catheter.  Not consistent with achalasia or other variant.  No major peristaltic motility disorder.   ESOPHAGOGASTRODUODENOSCOPY (EGD) WITH PROPOFOL N/A 09/14/2013   WUJ:WJXBJYNW'G ring. Hiatal hernia. Status post Heidi Fuentes and biopsy disruption.    FOOT SURGERY     X2   HEMORROIDECTOMY     HERNIA REPAIR     inguinal right   HERNIA REPAIR      LAPAROSCOPIC PARAESOPHAGEAL HERNIA REPAIR  02/2017   Dr. Carolynn Sayers [facility])   [place] DILATION N/A 09/14/2013   Procedure: Heidi Fuentes;  Surgeon: Corbin Ade, MD;  Location: [facility] ORS;  Service: Endoscopy;  Laterality: N/A;  56   POLYPECTOMY N/A 09/14/2013   Procedure: POLYPECTOMY;  Surgeon: Corbin Ade, MD;  Location: [facility] ORS;  Service: Endoscopy;  Laterality: N/A;   POLYPECTOMY  07/24/2021   Procedure: POLYPECTOMY;  Surgeon: Corbin Ade, MD;  [facility];  Service: Endoscopy;;   TONSILLECTOMY     Family History:  Family History  Problem Relation Age of Onset   Lung cancer Mother    Heart attack Father    Bone cancer Brother    COPD Sister    Colon cancer Neg Hx    Family Psychiatric  History:  Social History:  Social History   Substance and Sexual Activity  Alcohol Use No     Social History   Substance and Sexual Activity  Drug Use No    Social History   Socioeconomic History   Marital status: Divorced    Spouse name: Not on file   Number of children: Not on file   Years of education: Not on file   Highest education level: Not on file  Occupational History   Not on file  Tobacco Use   Smoking status: Never   Smokeless tobacco: Never   Tobacco comments:    NEVER SMOKED  Vaping Use   Vaping Use: Never used  Substance and Sexual Activity   Alcohol use: No   Drug use: No   Sexual activity: Not on file  Other Topics Concern   Not on file  Social History Narrative   Not on file   Social Determinants of Health   Financial Resource Strain: Not on file  Food Insecurity: No Food Insecurity (07/15/2022)   Hunger Vital Sign    Worried About Running Out of Food in the Last Year: Never true    Ran Out of Food in the Last Year: Never true  Transportation Needs: No Transportation Needs (07/15/2022)   PRAPARE - Administrator, Civil Service (Medical): No    Lack of Transportation (Non-Medical): No  Physical Activity: Not  on file  Stress: Not on file  Social Connections: Not on file   Additional Social History:                         Sleep: Fair  Appetite:  Fair  Current Medications: Current Facility-Administered Medications  Medication Dose Route Frequency Provider Last Rate Last Admin   acetaminophen (TYLENOL) tablet 650 mg  650 mg Oral Q6H PRN Emokpae, Ejiroghene E, MD   650 mg at 07/24/22 1000   Or   acetaminophen (TYLENOL) suppository 650 mg  650 mg Rectal Q6H PRN Emokpae, Ejiroghene E, MD       albuterol (PROVENTIL) (2.5 MG/3ML) 0.083% nebulizer solution 2.5  mg  2.5 mg Nebulization Q6H PRN Emokpae, Ejiroghene E, MD       benztropine (COGENTIN) tablet 0.5 mg  0.5 mg Oral BID Rankin, Shuvon B, NP   0.5 mg at 07/25/22 0851   Or   benztropine mesylate (COGENTIN) injection 0.5 mg  0.5 mg Intramuscular BID Rankin, Shuvon B, NP   0.5 mg at 07/22/22 1420   clotrimazole (GYNE-LOTRIMIN) vaginal cream 1 Applicatorful  1 Applicatorful Vaginal QHS Madueme, Elvira C, RPH       divalproex (DEPAKOTE SPRINKLE) capsule 125 mg  125 mg Oral Q12H Rankin, Shuvon B, NP   125 mg at 07/25/22 0849   enoxaparin (LOVENOX) injection 40 mg  40 mg Subcutaneous Q24H Emokpae, Ejiroghene E, MD   40 mg at 07/24/22 2106   feeding supplement (ENSURE ENLIVE / ENSURE PLUS) liquid 237 mL  237 mL Oral BID BM Vassie Loll, MD   237 mL at 07/24/22 1420   gabapentin (NEURONTIN) capsule 300 mg  300 mg Oral QHS Sherryll Burger, Pratik D, DO   300 mg at 07/24/22 2107   hydrALAZINE (APRESOLINE) injection 10 mg  10 mg Intravenous Q4H PRN Maurilio Lovely D, DO   10 mg at 07/17/22 2202   lamoTRIgine (LAMICTAL) tablet 200 mg  200 mg Oral BID Sherryll Burger, Pratik D, DO   200 mg at 07/25/22 0851   latanoprost (XALATAN) 0.005 % ophthalmic solution 1 drop  1 drop Both Eyes QHS Emokpae, Ejiroghene E, MD   1 drop at 07/24/22 2109   levothyroxine (SYNTHROID) tablet 50 mcg  50 mcg Oral Daily Maurilio Lovely D, DO   50 mcg at 07/25/22 0546   liver oil-zinc oxide  (DESITIN) 40 % ointment   Topical Daily Sherryll Burger, Pratik D, DO   Given at 07/24/22 1002   losartan (COZAAR) tablet 25 mg  25 mg Oral Daily Sherryll Burger, Pratik D, DO   25 mg at 07/25/22 0847   metoprolol tartrate (LOPRESSOR) tablet 12.5 mg  12.5 mg Oral BID Sherryll Burger, Pratik D, DO   12.5 mg at 07/25/22 0849   mometasone-formoterol (DULERA) 200-5 MCG/ACT inhaler 2 puff  2 puff Inhalation BID PRN Sherryll Burger, Pratik D, DO       montelukast (SINGULAIR) tablet 10 mg  10 mg Oral Daily Sherryll Burger, Pratik D, DO   10 mg at 07/25/22 0848   OLANZapine (ZYPREXA) injection 2.5 mg  2.5 mg Intramuscular Daily Rankin, Shuvon B, NP       Or   OLANZapine (ZYPREXA) tablet 2.5 mg  2.5 mg Oral Daily Rankin, Shuvon B, NP   2.5 mg at 07/25/22 0850   ondansetron (ZOFRAN) tablet 4 mg  4 mg Oral Q8H PRN Emokpae, Ejiroghene E, MD       Or   ondansetron (ZOFRAN) injection 4 mg  4 mg Intravenous Q8H PRN Emokpae, Ejiroghene E, MD       pantoprazole (PROTONIX) EC tablet 40 mg  40 mg Oral Daily Sherryll Burger, Pratik D, DO   40 mg at 07/25/22 0851   umeclidinium bromide (INCRUSE ELLIPTA) 62.5 MCG/ACT 1 puff  1 puff Inhalation Daily PRN Maurilio Lovely D, DO        Lab Results: No results found for this or any previous visit (from the past 48 hour(s)).  Blood Alcohol level:  Lab Results  Component Value Date   [facility] <10 07/15/2022   ETH <10 08/06/2020    Metabolic Disorder Labs: Lab Results  Component Value Date   HGBA1C 5.7 (H) 07/09/2013   MPG 117 (H)  07/09/2013   No results found for: "PROLACTIN" No results found for: "CHOL", "TRIG", "HDL", "CHOLHDL", "VLDL", "LDLCALC"  Physical Findings: AIMS:  , ,  ,  ,    CIWA:    COWS:      Psychiatric Specialty Exam:  Presentation  General Appearance:  Disheveled  Eye Contact: Minimal  Speech: Clear and Coherent  Speech Volume: Normal  Handedness: Right   Mood and Affect  Mood: Depressed  Affect: Congruent   Thought Process  Thought Processes: Coherent  Descriptions of  Associations:Intact  Orientation:Partial  Thought Content:Logical  History of Schizophrenia/Schizoaffective disorder:No data recorded Duration of Psychotic Symptoms:No data recorded Hallucinations:Hallucinations: None  Ideas of Reference:None  Suicidal Thoughts:Suicidal Thoughts: No  Homicidal Thoughts:Homicidal Thoughts: No   Sensorium  Memory: Immediate Fair; Recent Fair  Judgment: Fair  Insight: Lacking   Executive Functions  Concentration: Poor  Attention Span: Fair  Recall: Poor  Fund of Knowledge: Poor  Language: Poor   Psychomotor Activity  Psychomotor Activity: Psychomotor Activity: Other (comment) (Observed resting in bed leaning to the right)   Assets  Assets: Desire for Improvement; Social Support   Sleep  Sleep: Sleep: Fair    Physical Exam: Physical Exam Vitals reviewed.    Review of Systems  All other systems reviewed and are negative.  Blood pressure (!) 127/49, pulse (!) 51, temperature 98.1 F (36.7 C), temperature source Axillary, resp. rate 18, height 5' (1.524 m), weight 64 kg, SpO2 91 %. Body mass index is 27.56 kg/m.   Treatment Plan Summary: Daily contact with patient to assess and evaluate symptoms and progress in treatment and Medication management  Now dose of cogentin 2 mg- patient to be evaluated by psychiatry face to face on 6/23. Increased cogentin 0.5 mg to 1 mg po bid Continue Zyprex 2.5 mg daily and will decrease Zyprexa 5 mg nightly to Zyprexa 2.5 mg nightly  Reordered: CBC, CMP Depakote level and please update chart with EKG. Anticipate patient will be psychiatrically cleared pending lab results on 07/26/2022 -Will place a TOC consult to consider SNF placement.Oneta Rack, NP 07/25/2022, 11:42 AM

## 2022-07-25 NOTE — Progress Notes (Signed)
  Progress Note   Patient: Heidi Fuentes ZOX:[id number] DOB: 1946-02-01 DOA: 07/15/2022     10 DOS: the patient was seen and examined on 07/25/2022   Brief hospital admission narrative course: As per H&P written by Dr. Mariea Clonts on 07/15/2022 Heidi Fuentes is a 77 y.o. female with medical history significant for asthma, bipolar disorder, chronic pain, hypertension, bronchiectasis. Patient was brought to the ED via EMS reports of altered mental status.  Patient was last known normal on Saturday.  Her son called this morning but she was not answering normally, at baseline patient has slight confusion.  Son reports that patient was talking to the clock, talking to her hand, and did not recognize him.  At the time of my evaluation, patient is slightly somnolent, with eyes closed, but responding to some some directions, splinting appropriately to 1 question only.     ED Course: Tmax 98.6.  Respiratory rate 17-20 blood heart rate 119-129.  Blood pressure systolic 130s to 811B.  O2 sats greater than 96% on room air.  WBC 16.1.  UA with large leukocytes rare bacteria.  Head CT negative for acute abnormality. Hospitalist to admit for UTI with altered mental status.  Assessment and Plan: Severe sepsis at this moment resolved; patient has completed antibiotic therapy.  Due to the presence of yeast infection is receiving management with vaginal antifungal suppository (day 2 out of 7).  Continue supportive care, constant reorientation as psychiatric medication as recommended by psychiatry service.  Patient has found in need of placement in psychiatric facility for further management; awaiting bed availability.  She is hemodynamically stable.  Subjective:  No fever, no chest pain, no nausea, no vomiting.  Oriented x 1 only and demonstrating poor insight.  Physical Exam: Vitals:   07/24/22 0538 07/24/22 1349 07/24/22 2033 07/25/22 0421  BP: 125/75 120/75 (!) 129/57 (!) 127/49  Pulse:  66 (!) 55 (!) 51  Resp: 17  18 18 18   Temp: 98.1 F (36.7 C) 98.4 F (36.9 C) 97.9 F (36.6 C) 98.1 F (36.7 C)  TempSrc:  Oral Oral Axillary  SpO2: 98% 98% 99% 91%  Weight:      Height:       General exam: Oriented to person; no chest pain, no nausea, no vomiting, good saturation on room air. Respiratory system: Clear to auscultation. Respiratory effort normal.  No using accessory muscle. Cardiovascular system:RRR. No murmurs, rubs, gallops. Gastrointestinal system: Abdomen is nondistended, soft and nontender. No organomegaly or masses felt. Normal bowel sounds heard. Central nervous system: No focal neurologic deficit seen. Extremities: No cyanosis, clubbing or edema. Skin: No petechiae. Psychiatry: Judgement and insight appear impaired secondary to underlying psychiatric disorder; no suicidal hallucinations appreciated.  Flat affect appreciated.   Data Reviewed: Lamictal level pending.  Family Communication: No family at bedside.  Disposition: Status is: Inpatient Remains inpatient appropriate because: Continue supportive care and psychiatric management.   Planned Discharge Destination: Awaiting placement at behavioral health facility  Time spent: 30 minutes  Author: Vassie Loll, MD 07/25/2022 8:52 AM  For on call review [email or web address].

## 2022-07-25 NOTE — Progress Notes (Signed)
Fed supper and ate about 50% and asked for pepsi.   Holding cup now and sipping on straw.  Rambling speech.

## 2022-07-26 LAB — CK: Total CK: 327 U/L — ABNORMAL HIGH (ref 38–234)

## 2022-07-26 MED ORDER — SODIUM CHLORIDE 0.9 % IV BOLUS
1000.0000 mL | Freq: Once | INTRAVENOUS | Status: AC
  Administered 2022-07-26: 1000 mL via INTRAVENOUS

## 2022-07-26 NOTE — Progress Notes (Signed)
  Progress Note   Patient: Heidi Fuentes ION:[id number] DOB: September 06, 1945 DOA: 07/15/2022     11 DOS: the patient was seen and examined on 07/26/2022   Brief hospital admission narrative course: As per H&P written by Dr. Mariea Clonts on 07/15/2022 Lilia Argue Hirschhorn is a 77 y.o. female with medical history significant for asthma, bipolar disorder, chronic pain, hypertension, bronchiectasis. Patient was brought to the ED via EMS reports of altered mental status.  Patient was last known normal on Saturday.  Her son called this morning but she was not answering normally, at baseline patient has slight confusion.  Son reports that patient was talking to the clock, talking to her hand, and did not recognize him.  At the time of my evaluation, patient is slightly somnolent, with eyes closed, but responding to some some directions, splinting appropriately to 1 question only.     ED Course: Tmax 98.6.  Respiratory rate 17-20 blood heart rate 119-129.  Blood pressure systolic 130s to 244W.  O2 sats greater than 96% on room air.  WBC 16.1.  UA with large leukocytes rare bacteria.  Head CT negative for acute abnormality. Hospitalist to admit for UTI with altered mental status.  Assessment and Plan: Severe sepsis at this moment resolved; patient has completed antibiotic therapy.  Due to the presence of yeast infection is receiving management with vaginal antifungal suppository (day 2 out of 7).  Continue supportive care, constant reorientation as psychiatric medication as recommended by psychiatry service.  Patient has been found in need of placement of psychiatry facility for further evaluation and management; pending formal face-to-face evaluation by psychiatry service today. Patient blood pressure was slightly soft and will provide some gentle fluid resuscitation with close monitoring.  No other acute normalities appreciated.  Patient is stable overall.  Subjective:  No fever, no chest pain, no nausea, no vomiting.   Oriented x 2 and following commands on today's evaluation.  Generally weak and deconditioned.  Physical Exam: Vitals:   07/25/22 2105 07/26/22 0517 07/26/22 1357 07/26/22 1400  BP: 116/62 (!) 106/54 (!) 97/51 (!) 80/48  Pulse: 64 (!) 54 (!) 52   Resp: 18 19 16    Temp: 97.6 F (36.4 C) 97.8 F (36.6 C) 98 F (36.7 C)   TempSrc:      SpO2: 98% 96% 98%   Weight:      Height:       General exam: Alert, awake, oriented x 2; following commands appropriately and expressing feeling weak and tired.  No chest pain, no nausea vomiting. Respiratory system: Clear to auscultation. Respiratory effort normal.  Good saturation on room air. Cardiovascular system:RRR. No rubs or gallops; no JVD. Gastrointestinal system: Abdomen is nondistended, soft and nontender. No organomegaly or masses felt. Normal bowel sounds heard. Central nervous system: Generalized weakness; no focal neurological deficits. Extremities: No cyanosis, clubbing or edema. Skin: No petechiae. Psychiatry: Flat affect appreciated.   Data Reviewed: Lamictal level pending.  Family Communication: No family at bedside.  Disposition: Status is: Inpatient Remains inpatient appropriate because: Continue supportive care and psychiatric management.   Planned Discharge Destination: Awaiting placement at behavioral health facility  Time spent: 30 minutes  Author: Vassie Loll, MD 07/26/2022 5:14 PM  For on call review [email or web address].

## 2022-07-26 NOTE — Progress Notes (Signed)
Patient has been denied by [facility] due to no appropriate beds available. Patient meets BH inpatient criteria per Hillery Jacks, NP. Patient has been faxed out to the following facilities:   [facility] Pending - Request SentN/A[address] Claris Gower [place] [phone]--[facility] Pending - Request [address] [place] [phone]--NGEXB-MWUX [facility] Pending - Request [phone] [address] [place] [address], [facility] [place] [phone]--[facility] Pending - Request [address] [place], [place] [phone]--[facility] Pending - Request SentN/[address] [place] [phone]--[facility] Pending - Request SentN/[address] Genevie Cheshire [facility] [phone]--[facility] Pending - Request [address] [place] [phone]--BJSEG-BTDVVO [facility] Pending - Request SentN/A[address] Marylou Flesher [facility] Pending - Request [address] Sanjuana Kava [facility] [phone]--FGHWE-XHB [facility] Pending - Request SentN/A[address] [place] [phone]--[facility] Pending - Request SentN/A[address] [place] [address] [place], [place] [phone]--EUMPN-TIRW [facility] Pending - Request [facility] [place] [phone]--KDXIP-JASNK [facility] Pending - Request SentN/A[address] [facility] [address] Lacy Duverney [facility] Pending - Request SentN/A[address] Rhodia Albright [facility] [phone]--MEQAS[facility]TMHDQ [facility] Pending - Request SentN/A[address] [facility] Pending - Request SentN/[address] [place], [place] [phone]--UDJSH[facility] Pending - Request [address]N/[address], [place] [phone]--[facility] Pending - No Request [address] [place] [phone]--MOQHU-TML [facility] Pending - No Request [phone] [address] [place] [phone]--YKZLD-JTTSV [facility] Pending - No Request SentN/[address] [place] [phone]--TGYBW-LSLHTDS [facility] Pending - No Request SentN/A[address] [place] [phone]--XMIWO-EHOZ [facility] Pending - No Request [facility], [place] [phone]--  Damita Dunnings, MSW, LCSW-A  6:18 PM 07/26/2022

## 2022-07-26 NOTE — TOC Progression Note (Signed)
Transition of Care [facility]) - Progression Note    Patient Details  Name: Heidi Fuentes MRN: [id number] Date of Birth: September 02, 1945  [facility] CM/SW Contact  Catalina Gravel, [place] Phone Number: 07/26/2022, 8:56 AM  Clinical Narrative:    CSW reviewed sent bed requests, no offers at this time. TOC to follow.   Expected Discharge Plan: Home w Home Health Services Barriers to Discharge: Psych Bed not available  Expected Discharge Plan and Services       Living arrangements for the past 2 months: Single Family Home Expected Discharge Date: 07/20/22                                     Social Determinants of Health (SDOH) Interventions SDOH Screenings   Food Insecurity: No Food Insecurity (07/15/2022)  Housing: Low Risk  (07/15/2022)  Transportation Needs: No Transportation Needs (07/15/2022)  Utilities: Not At Risk (07/15/2022)  Tobacco Use: Low Risk  (07/22/2022)    Readmission Risk Interventions     No data to display

## 2022-07-27 ENCOUNTER — Encounter (HOSPITAL_COMMUNITY): Payer: Self-pay | Admitting: Internal Medicine

## 2022-07-27 DIAGNOSIS — R451 Restlessness and agitation: Secondary | ICD-10-CM | POA: Diagnosis present

## 2022-07-27 DIAGNOSIS — A419 Sepsis, unspecified organism: Secondary | ICD-10-CM | POA: Diagnosis not present

## 2022-07-27 DIAGNOSIS — R410 Disorientation, unspecified: Secondary | ICD-10-CM | POA: Diagnosis not present

## 2022-07-27 DIAGNOSIS — R6520 Severe sepsis without septic shock: Secondary | ICD-10-CM | POA: Diagnosis not present

## 2022-07-27 LAB — LAMOTRIGINE LEVEL: Lamotrigine Lvl: 7.9 ug/mL (ref 2.0–20.0)

## 2022-07-27 NOTE — Progress Notes (Signed)
Patient slept thorough the night. Took medications with not issues.  No complaints of pain. Plan of care ongoing.

## 2022-07-27 NOTE — Progress Notes (Signed)
Per psych provider Assunta Found, NP pt has been psych cleared. TOC is involved and placement for SNF is in process. This CSW will now remove pt from the [facility] shift report.   [name], MSW, [facility] 07/27/2022 9:55 PM

## 2022-07-27 NOTE — Progress Notes (Signed)
  Progress Note   Patient: Heidi Fuentes ZOX:[id number] DOB: 1945/08/11 DOA: 07/15/2022     12 DOS: the patient was seen and examined on 07/27/2022   Brief hospital admission narrative course: As per H&P written by Dr. Mariea Clonts on 07/15/2022 Heidi Fuentes is a 77 y.o. female with medical history significant for asthma, bipolar disorder, chronic pain, hypertension, bronchiectasis. Patient was brought to the ED via EMS reports of altered mental status.  Patient was last known normal on Saturday.  Her son called this morning but she was not answering normally, at baseline patient has slight confusion.  Son reports that patient was talking to the clock, talking to her hand, and did not recognize him.  At the time of my evaluation, patient is slightly somnolent, with eyes closed, but responding to some some directions, splinting appropriately to 1 question only.     ED Course: Tmax 98.6.  Respiratory rate 17-20 blood heart rate 119-129.  Blood pressure systolic 130s to 811B.  O2 sats greater than 96% on room air.  WBC 16.1.  UA with large leukocytes rare bacteria.  Head CT negative for acute abnormality. Hospitalist to admit for UTI with altered mental status.  Assessment and Plan: Severe sepsis at this moment resolved; patient has completed antibiotic therapy.  Due to the presence of yeast infection is receiving management with vaginal antifungal suppository (day 4 out of 7).  Continue supportive care, constant reorientation as psychiatric medication as recommended by psychiatry service.  Patient has been cleared by psychiatry service; no prior history of schizophrenia or paranoia; Zyprexa discontinue and patient will be discharged on Depakote. Patient blood pressure has stabilized and she is no demonstrating any acute complaints overnight events.  Hopefully discharge to skilled nursing facility on 07/28/2022 after being clear by psychiatry service.  Subjective:  Following commands appropriately; no fever,  no chest pain, no nausea or vomiting.  Physical Exam: Vitals:   07/26/22 2145 07/27/22 0517 07/27/22 1159 07/27/22 1348  BP: (!) 151/59 135/70 100/60 114/79  Pulse: 61 61  (!) 58  Resp:  16  17  Temp:  97.8 F (36.6 C)  98 F (36.7 C)  TempSrc:    Axillary  SpO2: 99% 92% 91% 92%  Weight:      Height:       General exam: Alert, awake, oriented x 3; no stridor and improvement in her insight and is adequately follow commands.  No chest pain, no nausea, no vomiting. Respiratory system: Clear to auscultation. Respiratory effort normal.  Good saturation on room air. Cardiovascular system:RRR. No murmurs, rubs, gallops. Gastrointestinal system: Abdomen is nondistended, soft and nontender. No organomegaly or masses felt. Normal bowel sounds heard. Central nervous system: Alert and oriented. No focal neurological deficits. Extremities: No cyanosis or clubbing. Skin: No petechiae. Psychiatry: Flat affect appreciated; no suicidal ideation or hallucinations.   Data Reviewed: Lamictal level pending.  Family Communication: No family at bedside.  Disposition: Status is: Inpatient Remains inpatient appropriate because: Continue supportive care and psychiatric management.   Planned Discharge Destination: Awaiting placement at behavioral health facility  Time spent: 30 minutes  Author: Vassie Loll, MD 07/27/2022 6:24 PM  For on call review [email or web address].

## 2022-07-27 NOTE — Consult Note (Cosign Needed Addendum)
[facility] Face-to-Face Psychiatry Consult   Reason for Consult:  altered mental status Referring Physician:  Erick Blinks, DO  Patient Identification: Heidi Fuentes MRN:  [id number] Principal Diagnosis: Delirium Diagnosis:  Principal Problem:   Delirium Active Problems:   Essential hypertension   Severe sepsis (HCC)   UTI (urinary tract infection)   Encephalopathy, metabolic   Mixed bipolar I disorder (HCC)   Depression, major, recurrent, mild (HCC)   Agitation   Total Time spent with patient: 45 minutes  Subjective:  Heidi Fuentes 77 y/o female patient with a psychiatric history of bipolar disorder and schizophrenia; a medical history of hypothyroidism, hypertension, asthma, and chronic pain.  Patient initially presented to [facility] emergency room 07/15/2022 via EMS with complaints of altered mental status.  Patient last known well was 07/12/2022 (Saturday prior to her admission).  On day of admission patient son reported he had called 07/15/2022 and patient not answering normally informed at baseline she is normally slightly confused.  States that patient did not recognize him, talking to clock and her hand.  Family reported to patient's son that on Sunday 07/13/2022 that patient grew more confused and upset and day progressed.     Patient admitted for inpatient medical treatment related sever sepsis most likely related to urinary tract infection, and metabolic encephalopathy most likely related to dehydration and infection.  Altered mental status/confusion most likely related to delirium most likely caused by UTI sepsis and metabolic encephalopathy.   HPI:  Heidi Fuentes   seen face to face today by this provider.  Chart reviewed and consulted with Dr. Nelly Rout on 07/27/22.  On evaluation Heidi Fuentes patient is lying in bed with body curled up (knees bent toward chest and arms bent at elbow.  Her eyes are closed.  He breakfast tray is on the bedside table but patient is unable to  feed herself.  Her lips are dry and cracked.   Assessed for muscle  rigidity/cogwheel .  Patient refused to allow arms to be extended but after asking to relax she allow provider to extend and flex arms bilaterally.  No noted rigidity/cogwheel noted   Patient denies suicidal/self-harm/homicidal ideation.  She is alert/oriented to person, place, city, and state.  She was calm and cooperative throughout assessment.  Her responses were mostly relevant and appropriate to assessment questions.  There were some comments made that was difficulty to hear.  She spoke in a clear tone at decreased volume, and slowed pace.  There was minimal eye contact.   She denies suicidal/self-harm/homicidal ideation. Objectively:  There is no evidence of psychosis/mania or delusional thinking.  However her input was liner and not a lot of conversation other than answering questions.  There is evidence that she continues to have some confusion but no notable paranoia or auditory/visual hallucinations.  There was no disability or preoccupation during assessment.   Patient will be psychiatrically cleared.  Her initial presentation of altered mental status/confusion was most likely related to septic urinary tract infection, metabolic encephalopathy.  This patient has no psychiatric history of schizophrenia only mental health history noted is depression and anxiety.  Psychiatric initially started Zyprexa to assist with delirium and Depakote  Sprinkles to assist with mood and agitation.  However there is no longer a need for Zyprexa and it will be discontinued.  Hospitalist can continue the Depakote Sprinkles for mood and agitation but will need to periodically check valproic acid level to make sure levels doesn't become toxic.  Current  valproic acid level (07/25/22) ws 22.  CK checked 07/25/22 was 327; want to make sure no rhabdomyolysis, muscle  rigidity/cogwheel.   Will also need monitor EKG for worsening of prolong QTc.   Last know EKG was  on  07/20/2022 Vent. rate 76 BPM PR interval 118 ms QRS duration 72 ms QT/QTcB 416/468 ms (prolong QTc) P-R-T axes 69 41 41 Normal sinus rhythm Normal ECG No previous ECGs available Confirmed by Carylon Perches [phone]) on 07/21/2022 6:57:12 AM  Will also order social work/TOC to consult with family for appropriate placement into skilled nursing or rehab related to the decline in self-care.    Past Psychiatric History: Depression and anxiety  Risk to Self:  Denies Risk to Others:  Denies Prior Inpatient Therapy:  Unaware Prior Outpatient Therapy:  Unaware  Past Medical History:  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[facility])     Past Surgical History:  Procedure Laterality Date   CATARACT EXTRACTION W/PHACO Right 10/06/2016   Procedure: CATARACT EXTRACTION PHACO AND INTRAOCULAR LENS PLACEMENT (IOC);  Surgeon: Galen Manila, MD;  Location: [facility] ORS;  Service: Ophthalmology;  Laterality: Right;  [place] 00:32.5 AP% 14.5 CDE 4.71 Fluid pack lot # [id number]   CATARACT EXTRACTION W/PHACO Left 11/03/2016   Procedure: CATARACT EXTRACTION PHACO AND INTRAOCULAR LENS PLACEMENT (IOC);  Surgeon: Galen Manila, MD;  Location: [facility] ORS;  Service: Ophthalmology;  Laterality: Left;  [place] 00:36.6 AP% 16.0 CDE 5.86 Fluid Pack lot # [id number]   COCCYX REMOVAL     colonoscopy  2005   Dr. Karilyn Cota: mild melanosis coli, otherwise normal   COLONOSCOPY WITH PROPOFOL N/A 09/14/2013    TFT:DDUKGURKY coli. Colonic diverticulosis. Single colonic. Tubular adenoma. Next TCS 09/2020.   COLONOSCOPY WITH PROPOFOL N/A 07/24/2021   Procedure: COLONOSCOPY WITH PROPOFOL;  Surgeon: Corbin Ade, MD;  [facility];  Service: Endoscopy;  Laterality: N/A;  2:00pm   ERCP  1997   [facility]: biliary manometry abnormal, subsequent sphincterotomy    ESOPHAGEAL MANOMETRY N/A 12/21/2016   Surgeon: Napoleon Form, MD; EG junction outflow obstruction which could be secondary to large hiatal hernia and possible coiling of catheter.  Not consistent with achalasia or other variant.  No major peristaltic motility disorder.   ESOPHAGOGASTRODUODENOSCOPY (EGD) WITH PROPOFOL N/A 09/14/2013   HCW:CBJSEGBT'D ring. Hiatal hernia. Status post Elease Hashimoto and biopsy disruption.    FOOT SURGERY     X2   HEMORROIDECTOMY     HERNIA REPAIR     inguinal right   HERNIA REPAIR     LAPAROSCOPIC PARAESOPHAGEAL HERNIA REPAIR  02/2017   Dr. Carolynn Sayers [facility])   [place] DILATION N/A 09/14/2013   Procedure: Alvy Beal;  Surgeon: Corbin Ade, MD;  Location: [facility] ORS;  Service: Endoscopy;  Laterality: N/A;  56  POLYPECTOMY N/A 09/14/2013   Procedure: POLYPECTOMY;  Surgeon: Corbin Ade, MD;  Location: [facility] ORS;  Service: Endoscopy;  Laterality: N/A;   POLYPECTOMY  07/24/2021   Procedure: POLYPECTOMY;  Surgeon: Corbin Ade, MD;  [facility];  Service: Endoscopy;;   TONSILLECTOMY     Family History:  Family History  Problem Relation Age of Onset   Lung cancer Mother    Heart attack Father    Bone cancer Brother    COPD Sister    Colon cancer Neg Hx    Family Psychiatric  History: None reported Social History:  Social History   Substance and Sexual Activity  Alcohol Use No     Social History   Substance and Sexual Activity  Drug Use No    Social History   Socioeconomic History   Marital status: Divorced    Spouse name: Not on file   Number of children: Not on  file   Years of education: Not on file   Highest education level: Not on file  Occupational History   Not on file  Tobacco Use   Smoking status: Never   Smokeless tobacco: Never   Tobacco comments:    NEVER SMOKED  Vaping Use   Vaping Use: Never used  Substance and Sexual Activity   Alcohol use: No   Drug use: No   Sexual activity: Not on file  Other Topics Concern   Not on file  Social History Narrative   Not on file   Social Determinants of Health   Financial Resource Strain: Not on file  Food Insecurity: No Food Insecurity (07/15/2022)   Hunger Vital Sign    Worried About Running Out of Food in the Last Year: Never true    Ran Out of Food in the Last Year: Never true  Transportation Needs: No Transportation Needs (07/15/2022)   PRAPARE - Administrator, Civil Service (Medical): No    Lack of Transportation (Non-Medical): No  Physical Activity: Not on file  Stress: Not on file  Social Connections: Not on file   Additional Social History:    Allergies:   Allergies  Allergen Reactions   Abilify [Aripiprazole] Other (See Comments)    Bad dreams   Geodon [Ziprasidone Hcl] Other (See Comments)    hospitalization specifics   Quetiapine Palpitations   Sulfa Antibiotics Rash   Trazodone And Nefazodone Other (See Comments)    Reaction is unknown    Labs:  Results for orders placed or performed during the hospital encounter of 07/15/22 (from the past 48 hour(s))  Valproic acid level     Status: Abnormal   Collection Time: 07/25/22 12:08 PM  Result Value Ref Range   Valproic Acid Lvl 22 (L) 50.0 - 100.0 ug/mL    Comment: Performed at [facility], [address], [place], [place] [address]  CBC with Differential/Platelet     Status: Abnormal   Collection Time: 07/25/22 12:08 PM  Result Value Ref Range   WBC 10.1 4.0 - 10.5 K/uL   RBC 4.48 3.87 - 5.11 MIL/uL   Hemoglobin 14.1 12.0 - 15.0 g/dL   HCT 84.1 32.4 - 40.1 %   MCV 95.1 80.0 - 100.0 fL   MCH  31.5 26.0 - 34.0 pg   MCHC 33.1 30.0 - 36.0 g/dL   RDW 02.7 25.3 - 66.4 %   Platelets 313 150 - 400 K/uL   nRBC 0.0 0.0 - 0.2 %   Neutrophils Relative % 55 %  Neutro Abs 5.5 1.7 - 7.7 K/uL   Lymphocytes Relative 31 %   Lymphs Abs 3.1 0.7 - 4.0 K/uL   Monocytes Relative 10 %   Monocytes Absolute 1.1 (H) 0.1 - 1.0 K/uL   Eosinophils Relative 3 %   Eosinophils Absolute 0.3 0.0 - 0.5 K/uL   Basophils Relative 0 %   Basophils Absolute 0.0 0.0 - 0.1 K/uL   Immature Granulocytes 1 %   Abs Immature Granulocytes 0.07 0.00 - 0.07 K/uL    Comment: Performed at [facility], [address] [place], [place] [address]  Comprehensive metabolic panel     Status: Abnormal   Collection Time: 07/25/22 12:08 PM  Result Value Ref Range   Sodium 139 135 - 145 mmol/L   Potassium 3.3 (L) 3.5 - 5.1 mmol/L   Chloride 102 98 - 111 mmol/L   CO2 26 22 - 32 mmol/L   Glucose, Bld 95 70 - 99 mg/dL    Comment: Glucose reference range applies only to samples taken after fasting for at least 8 hours.   BUN 26 (H) 8 - 23 mg/dL   Creatinine, Ser 6.04 0.44 - 1.00 mg/dL   Calcium 8.9 8.9 - 54.0 mg/dL   Total Protein 5.8 (L) 6.5 - 8.1 g/dL   Albumin 3.0 (L) 3.5 - 5.0 g/dL   AST 26 15 - 41 U/L   ALT 27 0 - 44 U/L   Alkaline Phosphatase 41 38 - 126 U/L   Total Bilirubin 0.7 0.3 - 1.2 mg/dL   GFR, Estimated >98 >11 mL/min    Comment: (NOTE) Calculated using the CKD-EPI Creatinine Equation (2021)    Anion gap 11 5 - 15    Comment: Performed at [facility], [address], [place], [place] [address]  CK     Status: Abnormal   Collection Time: 07/26/22  9:08 AM  Result Value Ref Range   Total CK 327 (H) 38 - 234 U/L    Comment: Performed at [facility], [address] [place], [place] [address]    Current Facility-Administered Medications  Medication Dose Route Frequency Provider Last Rate Last Admin   acetaminophen (TYLENOL) tablet 650 mg  650 mg Oral Q6H PRN Emokpae, Ejiroghene E, MD   650 mg at 07/25/22  1523   Or   acetaminophen (TYLENOL) suppository 650 mg  650 mg Rectal Q6H PRN Emokpae, Ejiroghene E, MD       albuterol (PROVENTIL) (2.5 MG/3ML) 0.083% nebulizer solution 2.5 mg  2.5 mg Nebulization Q6H PRN Emokpae, Ejiroghene E, MD       benztropine (COGENTIN) tablet 1 mg  1 mg Oral BID Oneta Rack, NP   1 mg at 07/27/22 1308   Or   benztropine mesylate (COGENTIN) injection 1 mg  1 mg Intramuscular BID Oneta Rack, NP       clotrimazole (GYNE-LOTRIMIN) vaginal cream 1 Applicatorful  1 Applicatorful Vaginal QHS Madueme, Elvira C, RPH   1 Applicatorful at 07/26/22 2149   divalproex (DEPAKOTE SPRINKLE) capsule 125 mg  125 mg Oral Q12H Tiawanna Luchsinger B, NP   125 mg at 07/27/22 0808   enoxaparin (LOVENOX) injection 40 mg  40 mg Subcutaneous Q24H Emokpae, Ejiroghene E, MD   40 mg at 07/26/22 2149   feeding supplement (ENSURE ENLIVE / ENSURE PLUS) liquid 237 mL  237 mL Oral BID BM Vassie Loll, MD   237 mL at 07/24/22 1420   gabapentin (NEURONTIN) capsule 300 mg  300 mg Oral QHS Sherryll Burger, Pratik  D, DO   300 mg at 07/26/22 2148   hydrALAZINE (APRESOLINE) injection 10 mg  10 mg Intravenous Q4H PRN Maurilio Lovely D, DO   10 mg at 07/17/22 2202   lamoTRIgine (LAMICTAL) tablet 200 mg  200 mg Oral BID Maurilio Lovely D, DO   200 mg at 07/27/22 0808   latanoprost (XALATAN) 0.005 % ophthalmic solution 1 drop  1 drop Both Eyes QHS Emokpae, Ejiroghene E, MD   1 drop at 07/26/22 2149   levothyroxine (SYNTHROID) tablet 50 mcg  50 mcg Oral Daily Maurilio Lovely D, DO   50 mcg at 07/27/22 0737   liver oil-zinc oxide (DESITIN) 40 % ointment   Topical Daily Maurilio Lovely D, DO   Given at 07/27/22 1062   losartan (COZAAR) tablet 25 mg  25 mg Oral Daily Maurilio Lovely D, DO   25 mg at 07/27/22 6948   metoprolol tartrate (LOPRESSOR) tablet 12.5 mg  12.5 mg Oral BID Maurilio Lovely D, DO   12.5 mg at 07/27/22 5462   mometasone-formoterol (DULERA) 200-5 MCG/ACT inhaler 2 puff  2 puff Inhalation BID PRN Sherryll Burger, Pratik D, DO        montelukast (SINGULAIR) tablet 10 mg  10 mg Oral Daily Sherryll Burger, Pratik D, DO   10 mg at 07/27/22 0808   ondansetron (ZOFRAN) tablet 4 mg  4 mg Oral Q8H PRN Emokpae, Ejiroghene E, MD       Or   ondansetron (ZOFRAN) injection 4 mg  4 mg Intravenous Q8H PRN Emokpae, Ejiroghene E, MD       pantoprazole (PROTONIX) EC tablet 40 mg  40 mg Oral Daily Sherryll Burger, Pratik D, DO   40 mg at 07/27/22 7035   umeclidinium bromide (INCRUSE ELLIPTA) 62.5 MCG/ACT 1 puff  1 puff Inhalation Daily PRN Maurilio Lovely D, DO        Musculoskeletal: Strength & Muscle Tone: within normal limits Gait & Station:  Did not see patient ambulate Patient leans: N/A   Psychiatric Specialty Exam:  Presentation  General Appearance:  Appropriate for Environment  Eye Contact: Minimal  Speech: Clear and Coherent; Slow  Speech Volume: Decreased  Handedness: Right   Mood and Affect  Mood: Dysphoric  Affect: Congruent   Thought Process  Thought Processes: Coherent; Linear  Descriptions of Associations:Circumstantial  Orientation:Other (comment) (she is oriented to self, able to give the name of hospital, current city, state)  Thought Content:Other (comment) (liner)  History of Schizophrenia/Schizoaffective disorder:No data recorded Duration of Psychotic Symptoms:No data recorded Hallucinations:Hallucinations: None  Ideas of Reference:None  Suicidal Thoughts:Suicidal Thoughts: No  Homicidal Thoughts:Homicidal Thoughts: No   Sensorium  Memory: Immediate Fair; Recent Fair  Judgment: Fair  Insight: Lacking   Executive Functions  Concentration: Poor  Attention Span: Poor  Recall: Poor  Fund of Knowledge: Poor  Language: Poor   Psychomotor Activity  Psychomotor Activity: Psychomotor Activity: Decreased (Lying in bed curled with legs bent towards chest and arm folded at elbow with hands at shoulders)   Assets  Assets: Desire for Improvement; Financial Resources/Insurance;  Housing   Sleep  Sleep: Sleep: Good   Physical Exam: Physical Exam Vitals and nursing note reviewed. Exam conducted with a chaperone present.  Constitutional:      General: She is not in acute distress.    Appearance: Normal appearance. She is not ill-appearing.  HENT:     Head: Normocephalic.  Cardiovascular:     Rate and Rhythm: Normal rate.  Pulmonary:     Effort: Pulmonary effort is  normal. No respiratory distress.  Musculoskeletal:     Comments: No noted muscle rigidity with movement of arm.  Complaints of pain with movement.   Skin:    General: Skin is warm and dry.     Comments: Lips dry and cracked  Neurological:     Mental Status: She is alert and oriented to person, place, and time.  Psychiatric:        Attention and Perception: Perception normal. She does not perceive auditory or visual hallucinations.        Mood and Affect: Mood normal.        Speech: Speech normal. Slurred: Slowed.        Behavior: Behavior is slowed. Behavior is cooperative.        Thought Content: Thought content normal. Thought content is not paranoid. Thought content does not include homicidal or suicidal ideation.    Review of Systems  Constitutional:        No other complaints voiced to this provider  Musculoskeletal:        Complaints of pain with movement of arms  Psychiatric/Behavioral:  Positive for depression. Negative for hallucinations, substance abuse and suicidal ideas.   All other systems reviewed and are negative.  Blood pressure 135/70, pulse 61, temperature 97.8 F (36.6 C), resp. rate 16, height 5' (1.524 m), weight 64 kg, SpO2 92 %. Body mass index is 27.56 kg/m.  Treatment Plan Summary: Psychiatrically cleared.  Social work/TOC consult ordered:  This patient has been psychiatrically cleared.  Will need to consult with patient and family for appropriate placement in skilled nursing/rehab.  Secure message sent to Dr. Vassie Loll and patients nurse Angela Nevin, RN  informing of recommendations and disposition:  Patient psychiatrically cleared.  Psychotropic medications started to assist with the delirium and agitation which is most likely related to sepsis UTI, and metabolic encephalopathy.  Patient has no history of schizophrenia only depression and anxiety.  Will discontinue the Zyprexa but can continue the Depakote sprinkles to assist with mood and agitation.  May want to recheck valproic acid periodically and EKG to rule out prolong QTc. I will also order social work/TOC consult informing that patient has been psychiatrically cleared so they can consult with patient and family to arrange skilled nursing placement.    Disposition: No evidence of imminent risk to self or others at present.   Patient does not meet criteria for psychiatric inpatient admission.  Kwane Rohl, NP 07/27/2022 10:52 AM

## 2022-07-27 NOTE — [place]2 (Signed)
[place] MEDICAID FL2 LEVEL OF CARE FORM     IDENTIFICATION  Patient Name: Heidi Fuentes Birthdate: 10-Oct-1945 Sex: female Admission Date (Current Location): 07/15/2022  [facility] and [place] Number:  [employer] and Address:  [facility],  [address] [place] [address]      Provider Number: [id number]  Attending Physician Name and Address:  Vassie Loll, MD  Relative Name and Phone Number:       Current Level of Care: Hospital Recommended Level of Care: Skilled Nursing Facility Prior Approval Number:    Date Approved/Denied:   PASRR Number: [id number]  Discharge Plan: SNF    Current Diagnoses: Patient Active Problem List   Diagnosis Date Noted   Agitation 07/27/2022   Mixed bipolar I disorder (HCC) 07/22/2022   Delirium 07/22/2022   Depression, major, recurrent, mild (HCC) 07/22/2022   Severe sepsis (HCC) 07/15/2022   UTI (urinary tract infection) 07/15/2022   Encephalopathy, metabolic 07/15/2022   History of adenomatous polyp of colon 05/12/2021   Bipolar affective disorder, depressed, mild (HCC) 08/07/2020   Rhabdomyolysis 08/03/2020   Elevated LFTs 08/03/2020   Unilateral primary osteoarthritis, left hip 03/26/2020   Pedal edema 03/18/2020   Pain in joint involving ankle and foot 12/20/2019   Bronchiectasis without complication (HCC) 10/31/2019   DOE (dyspnea on exertion) 07/27/2019   Dysphagia 07/25/2018   Leukocytosis 07/25/2018   Closed fracture of left orbital floor (HCC) 07/25/2018   Exudative age-related macular degeneration, bilateral, with active choroidal neovascularization (HCC) 07/25/2018   CAP (community acquired pneumonia) due to MRSA (methicillin resistant Staphylococcus aureus) (HCC) 02/13/2018   Asthma, chronic, unspecified asthma severity, with acute exacerbation 02/04/2018   Cough 11/02/2017   Gastroesophageal reflux disease without esophagitis 10/30/2017   Other specified glaucoma 10/30/2017   Uncomplicated  asthma 10/30/2017   Chronic rhinitis 10/30/2017   Long-term current use of opiate analgesic 03/10/2017   Essential hypertension 02/17/2017   On long term drug therapy 02/08/2017   Chronic narcotic use 02/04/2017   Hiatal hernia 02/03/2017   Hypokalemia 09/06/2015   Fall    Iron deficiency anemia, unspecified 12/15/2013   Schatzki's ring 08/17/2013   Anemia 08/17/2013   Encounter for screening colonoscopy 08/17/2013   Constipation 07/10/2013   Chronic pain syndrome 07/10/2013   Other specified hypothyroidism 07/09/2013   Colitis 07/09/2013   Asthma 07/09/2013    Orientation RESPIRATION BLADDER Height & Weight     Self, Time, Situation, Place  Normal Incontinent Weight: 141 lb 1.5 oz (64 kg) Height:  5' (152.4 cm)  BEHAVIORAL SYMPTOMS/MOOD NEUROLOGICAL BOWEL NUTRITION STATUS      Continent Diet (see dc summary)  AMBULATORY STATUS COMMUNICATION OF NEEDS Skin   Extensive Assist Verbally Normal                       Personal Care Assistance Level of Assistance  Bathing, Feeding, Dressing Bathing Assistance: Limited assistance Feeding assistance: Independent Dressing Assistance: Limited assistance     Functional Limitations Info  Sight, Hearing, Speech Sight Info: Impaired Hearing Info: Adequate Speech Info: Adequate    SPECIAL CARE FACTORS FREQUENCY  PT (By licensed PT), OT (By licensed OT)     PT Frequency: 5x week OT Frequency: 5x week            Contractures Contractures Info: Not present    Additional Factors Info  Code Status, Allergies Code Status Info: Full Allergies Info: Abilify (Aripiprazole), Geodon (Ziprasidone Hcl), Quetiapine, Sulfa Antibiotics, Trazodone And Nefazodone  Current Medications (07/27/2022):  This is the current hospital active medication list Current Facility-Administered Medications  Medication Dose Route Frequency Provider Last Rate Last Admin   acetaminophen (TYLENOL) tablet 650 mg  650 mg Oral Q6H PRN  Emokpae, Ejiroghene E, MD   650 mg at 07/25/22 1523   Or   acetaminophen (TYLENOL) suppository 650 mg  650 mg Rectal Q6H PRN Emokpae, Ejiroghene E, MD       albuterol (PROVENTIL) (2.5 MG/3ML) 0.083% nebulizer solution 2.5 mg  2.5 mg Nebulization Q6H PRN Emokpae, Ejiroghene E, MD       benztropine (COGENTIN) tablet 1 mg  1 mg Oral BID Oneta Rack, NP   1 mg at 07/27/22 6213   Or   benztropine mesylate (COGENTIN) injection 1 mg  1 mg Intramuscular BID Oneta Rack, NP       clotrimazole (GYNE-LOTRIMIN) vaginal cream 1 Applicatorful  1 Applicatorful Vaginal QHS Madueme, Elvira C, RPH   1 Applicatorful at 07/26/22 2149   divalproex (DEPAKOTE SPRINKLE) capsule 125 mg  125 mg Oral Q12H Rankin, Shuvon B, NP   125 mg at 07/27/22 0808   enoxaparin (LOVENOX) injection 40 mg  40 mg Subcutaneous Q24H Emokpae, Ejiroghene E, MD   40 mg at 07/26/22 2149   feeding supplement (ENSURE ENLIVE / ENSURE PLUS) liquid 237 mL  237 mL Oral BID BM Vassie Loll, MD   237 mL at 07/24/22 1420   gabapentin (NEURONTIN) capsule 300 mg  300 mg Oral QHS Sherryll Burger, Pratik D, DO   300 mg at 07/26/22 2148   hydrALAZINE (APRESOLINE) injection 10 mg  10 mg Intravenous Q4H PRN Sherryll Burger, Pratik D, DO   10 mg at 07/17/22 2202   lamoTRIgine (LAMICTAL) tablet 200 mg  200 mg Oral BID Sherryll Burger, Pratik D, DO   200 mg at 07/27/22 0808   latanoprost (XALATAN) 0.005 % ophthalmic solution 1 drop  1 drop Both Eyes QHS Emokpae, Ejiroghene E, MD   1 drop at 07/26/22 2149   levothyroxine (SYNTHROID) tablet 50 mcg  50 mcg Oral Daily Maurilio Lovely D, DO   50 mcg at 07/27/22 0520   liver oil-zinc oxide (DESITIN) 40 % ointment   Topical Daily Sherryll Burger, Pratik D, DO   Given at 07/27/22 0816   losartan (COZAAR) tablet 25 mg  25 mg Oral Daily Sherryll Burger, Pratik D, DO   25 mg at 07/27/22 0865   metoprolol tartrate (LOPRESSOR) tablet 12.5 mg  12.5 mg Oral BID Maurilio Lovely D, DO   12.5 mg at 07/27/22 7846   mometasone-formoterol (DULERA) 200-5 MCG/ACT inhaler 2 puff  2 puff  Inhalation BID PRN Sherryll Burger, Pratik D, DO       montelukast (SINGULAIR) tablet 10 mg  10 mg Oral Daily Sherryll Burger, Pratik D, DO   10 mg at 07/27/22 0808   ondansetron (ZOFRAN) tablet 4 mg  4 mg Oral Q8H PRN Emokpae, Ejiroghene E, MD       Or   ondansetron (ZOFRAN) injection 4 mg  4 mg Intravenous Q8H PRN Emokpae, Ejiroghene E, MD       pantoprazole (PROTONIX) EC tablet 40 mg  40 mg Oral Daily Sherryll Burger, Pratik D, DO   40 mg at 07/27/22 9629   umeclidinium bromide (INCRUSE ELLIPTA) 62.5 MCG/ACT 1 puff  1 puff Inhalation Daily PRN Maurilio Lovely D, DO         Discharge Medications: Please see discharge summary for a list of discharge medications.  Relevant Imaging Results:  Relevant Lab Results:  Additional Information SSN: [id number] [place]

## 2022-07-27 NOTE — TOC Progression Note (Addendum)
Transition of Care [facility]) - Progression Note    Patient Details  Name: Heidi Fuentes MRN: [id number] Date of Birth: Mar 12, 1945  [facility]) CM/SW Contact  Elliot Gault, LCSW Phone Number: 07/27/2022, 11:25 AM  Clinical Narrative:     TOC following. Pt has been cleared by TTS and TOC consulted for SNF rehab placement. Spoke with pt/family and discussed SNF rehab and they are agreeable. CMS provider options reviewed. Will refer as requested.  Will follow.  1603: Spoke with pt/family to update on bed offers. They select [facility] in [place]. Maris Berger at the SNF. Anticipating dc tomorrow.  Expected Discharge Plan: Skilled Nursing Facility Barriers to Discharge: SNF Pending bed offer   Patient Goals and CMS Choice Patient states their goals for this hospitalization and ongoing recovery are:: rehab CMS Medicare.gov Compare Post Acute Care list provided to:: Patient Represenative (must comment) Choice offered to / list presented to : Adult Children  Expected Discharge Plan and Services Expected Discharge Plan: Skilled Nursing Facility In-house Referral: Clinical Social Work   Post Acute Care Choice: Skilled Nursing Facility Living arrangements for the past 2 months: Single Family Home Expected Discharge Date: 07/20/22                                    Prior Living Arrangements/Services Living arrangements for the past 2 months: Single Family Home Lives with:: Self Patient language and need for interpreter reviewed:: Yes Do you feel safe going back to the place where you live?: Yes      Need for Family Participation in Patient Care: Yes (Comment)     Criminal Activity/Legal Involvement Pertinent to Current Situation/Hospitalization: No - Comment as needed  Activities of Daily Living Home Assistive Devices/Equipment: Cane (specify quad or straight) ADL Screening (condition at time of admission) Patient's cognitive ability adequate to safely  complete daily activities?: No Is the patient deaf or have difficulty hearing?: Yes Does the patient have difficulty seeing, even when wearing glasses/contacts?: Yes Does the patient have difficulty concentrating, remembering, or making decisions?: Yes Patient able to express need for assistance with ADLs?: Yes Does the patient have difficulty dressing or bathing?: No Independently performs ADLs?: Yes (appropriate for developmental age) Does the patient have difficulty walking or climbing stairs?: No Weakness of Legs: None Weakness of Arms/Hands: None  Permission Sought/Granted Permission sought to share information with : Facility Industrial/product designer granted to share information with : Yes, Verbal Permission Granted  Share Information with NAME: son, [name]  Permission granted to share info w AGENCY: snfs        Emotional Assessment Appearance:: Appears stated age     Orientation: : Oriented to Self, Oriented to Place, Oriented to  Time, Oriented to Situation Alcohol / Substance Use: Not Applicable Psych Involvement: No (comment)  Admission diagnosis:  Severe sepsis (HCC) [A41.9, R65.20] Patient Active Problem List   Diagnosis Date Noted   Agitation 07/27/2022   Mixed bipolar I disorder (HCC) 07/22/2022   Delirium 07/22/2022   Depression, major, recurrent, mild (HCC) 07/22/2022   Severe sepsis (HCC) 07/15/2022   UTI (urinary tract infection) 07/15/2022   Encephalopathy, metabolic 07/15/2022   History of adenomatous polyp of colon 05/12/2021   Bipolar affective disorder, depressed, mild (HCC) 08/07/2020   Rhabdomyolysis 08/03/2020   Elevated LFTs 08/03/2020   Unilateral primary osteoarthritis, left hip 03/26/2020   Pedal edema 03/18/2020   Pain in joint involving ankle  and foot 12/20/2019   Bronchiectasis without complication (HCC) 10/31/2019   DOE (dyspnea on exertion) 07/27/2019   Dysphagia 07/25/2018   Leukocytosis 07/25/2018   Closed fracture of left  orbital floor (HCC) 07/25/2018   Exudative age-related macular degeneration, bilateral, with active choroidal neovascularization (HCC) 07/25/2018   CAP (community acquired pneumonia) due to MRSA (methicillin resistant Staphylococcus aureus) (HCC) 02/13/2018   Asthma, chronic, unspecified asthma severity, with acute exacerbation 02/04/2018   Cough 11/02/2017   Gastroesophageal reflux disease without esophagitis 10/30/2017   Other specified glaucoma 10/30/2017   Uncomplicated asthma 10/30/2017   Chronic rhinitis 10/30/2017   Long-term current use of opiate analgesic 03/10/2017   Essential hypertension 02/17/2017   On long term drug therapy 02/08/2017   Chronic narcotic use 02/04/2017   Hiatal hernia 02/03/2017   Hypokalemia 09/06/2015   Fall    Iron deficiency anemia, unspecified 12/15/2013   Schatzki's ring 08/17/2013   Anemia 08/17/2013   Encounter for screening colonoscopy 08/17/2013   Constipation 07/10/2013   Chronic pain syndrome 07/10/2013   Other specified hypothyroidism 07/09/2013   Colitis 07/09/2013   Asthma 07/09/2013   PCP:  Tanna Furry, MD Pharmacy:   [facility] [address] [place] - [address] [place] [address] Phone: [phone] Fax: [phone]  [facility] - [place], [place] - [address] [place] [address] Phone: [phone] Fax: [phone]  [facility] - [place], [place] - [address] [place] [address] Phone: [phone] Fax: [phone]     Social Determinants of Health (SDOH) Interventions    Readmission Risk Interventions     No data to display           Expected Discharge Plan: Skilled Nursing Facility Barriers to Discharge: SNF Pending bed offer  Expected Discharge Plan and Services In-house Referral: Clinical Social Work   Post Acute Care Choice: Skilled Nursing Facility Living  arrangements for the past 2 months: Single Family Home Expected Discharge Date: 07/20/22                                     Social Determinants of Health (SDOH) Interventions SDOH Screenings   Food Insecurity: No Food Insecurity (07/15/2022)  Housing: Low Risk  (07/15/2022)  Transportation Needs: No Transportation Needs (07/15/2022)  Utilities: Not At Risk (07/15/2022)  Tobacco Use: Low Risk  (07/27/2022)    Readmission Risk Interventions     No data to display

## 2022-07-28 MED ORDER — ENSURE ENLIVE PO LIQD: 237.0000 mL | Freq: Two times a day (BID) | ORAL | Status: DC

## 2022-07-28 MED ORDER — CLOTRIMAZOLE 1 % VA CREA: 1.0000 | TOPICAL_CREAM | Freq: Every day | VAGINAL | 0 refills | Status: AC

## 2022-07-28 MED ORDER — DIVALPROEX SODIUM 125 MG PO CSDR: 125.0000 mg | DELAYED_RELEASE_CAPSULE | Freq: Two times a day (BID) | ORAL | 1 refills | Status: DC

## 2022-07-28 MED ORDER — BUPROPION HCL 75 MG PO TABS: 75.0000 mg | ORAL_TABLET | Freq: Two times a day (BID) | ORAL | Status: DC

## 2022-07-28 MED ORDER — BENZTROPINE MESYLATE 1 MG PO TABS: 1.0000 mg | ORAL_TABLET | Freq: Two times a day (BID) | ORAL | 1 refills | Status: DC

## 2022-07-28 NOTE — Discharge Summary (Signed)
Physician Discharge Summary   Patient: Heidi Fuentes MRN: [id number] DOB: 11-13-1945  Admit date:     07/15/2022  Discharge date: 07/28/22  Discharge Physician: Vassie Loll   PCP: Tanna Furry, MD   Recommendations at discharge:  Repeat basic metabolic panel to follow electrolytes and renal function Reassess blood pressure and adjust antihypertensive regimen as needed Outpatient follow-up with psychiatry service as instructed.  Discharge Diagnoses: Principal Problem:   Delirium Active Problems:   UTI (urinary tract infection)   Encephalopathy, metabolic   Essential hypertension   Sepsis (HCC)   Mixed bipolar I disorder (HCC)   Depression, major, recurrent, mild (HCC)   Agitation  Brief hospital admission narrative course: As per H&P written by Dr. Mariea Clonts on 07/15/2022 Heidi Fuentes is a 77 y.o. female with medical history significant for asthma, bipolar disorder, chronic pain, hypertension, bronchiectasis. Patient was brought to the ED via EMS reports of altered mental status.  Patient was last known normal on Saturday.  Her son called this morning but she was not answering normally, at baseline patient has slight confusion.  Son reports that patient was talking to the clock, talking to her hand, and did not recognize him.  At the time of my evaluation, patient is slightly somnolent, with eyes closed, but responding to some some directions, splinting appropriately to 1 question only.     ED Course: Tmax 98.6.  Respiratory rate 17-20 blood heart rate 119-129.  Blood pressure systolic 130s to 563O.  O2 sats greater than 96% on room air.  WBC 16.1.  UA with large leukocytes rare bacteria.  Head CT negative for acute abnormality. Hospitalist to admit for UTI with altered mental status.  Assessment and Plan: Severe sepsis (HCC)-resolved Severe sepsis likely due to urinary tract infection.  Meeting severe sepsis criteria with tachycardia, heart rate 118-129, with  leukocytosis of 16.1, and evidence of endorgan dysfunction- encephalopathy.  Lactic acid 1.2.  No recent urine cultures on file -Blood cultures with no growth noted thus far -Urine cultures with mult.  Suspicious. -Patient was empirically treated with IV ceftriaxone while hospitalized and complete antibiotic therapy in-house.   Acute metabolic encephalopathy-persistent Presenting with confused speech, on my evaluation patient is somnolent, following some directions.  CT negative for acute abnormality.  Encephalopathy likely secondary to severe sepsis, UTI, dehydration and hospital-acquired delirium. -TSH 0.581 -Appreciate assistance and recommendation by psychiatry service; patient with underlying psychiatric disorders and acute delirium due to hospitalization and acute infection. -Resume the use of bupropion at adjusted dose, continue also Cogentin and Depakote.   -Outpatient follow-up with psychiatry recommended on as-needed basis. -PT/OT evaluation with recommendations for SNF on discharge.   Essential hypertension Systolic 130s to 756E. -HCTZ has been discontinued at discharge; facilitating and maintaining adequate hydration and is stable electrolytes.   -Continue treatment with metoprolol, losartan and heart healthy diet -Vital signs stable at time of discharge.  Vaginal yeast infection -Continue treatment with clotrimazole as instructed -Keep area clean and dry.  Consultants: Psychiatry service Procedures performed: See below for x-ray report. Disposition: Skilled nursing facility Diet recommendation: Heart healthy diet.  DISCHARGE MEDICATION: Allergies as of 07/28/2022       Reactions   Abilify [aripiprazole] Other (See Comments)   Bad dreams   Geodon [ziprasidone Hcl] Other (See Comments)   hospitalization specifics   Quetiapine Palpitations   Sulfa Antibiotics Rash   Trazodone And Nefazodone Other (See Comments)   Reaction is unknown        Medication List  STOP taking these medications    FLUoxetine 40 MG capsule Commonly known as: PROZAC   hydrochlorothiazide 12.5 MG tablet Commonly known as: HYDRODIURIL   Oxycodone HCl 10 MG Tabs       TAKE these medications    acetaminophen 500 MG tablet Commonly known as: TYLENOL Take 500 mg by mouth every 6 (six) hours as needed for mild pain or moderate pain. For pain   albuterol 108 (90 Base) MCG/ACT inhaler Commonly known as: VENTOLIN HFA Inhale 1 puff into the lungs every 4 (four) hours as needed for wheezing or shortness of breath. For shortness of breath   albuterol (2.5 MG/3ML) 0.083% nebulizer solution Commonly known as: PROVENTIL Take 3 mLs (2.5 mg total) by nebulization every 6 (six) hours as needed for wheezing or shortness of breath.   beclomethasone 80 MCG/ACT inhaler Commonly known as: QVAR Inhale 1 puff into the lungs daily.   benztropine 1 MG tablet Commonly known as: COGENTIN Take 1 tablet (1 mg total) by mouth 2 (two) times daily.   Breztri Aerosphere 160-9-4.8 MCG/ACT Aero Generic drug: Budeson-Glycopyrrol-Formoterol Inhale 2 puffs into the lungs in the morning and at bedtime. What changed: how much to take   buPROPion 75 MG tablet Commonly known as: WELLBUTRIN Take 1 tablet (75 mg total) by mouth 2 (two) times daily.   cetirizine 5 MG tablet Commonly known as: ZYRTEC Take 5 mg by mouth at bedtime.   chlorhexidine 0.12 % solution Commonly known as: PERIDEX Use as directed 15 mLs in the mouth or throat 2 (two) times daily.   cholecalciferol 25 MCG (1000 UNIT) tablet Commonly known as: VITAMIN D3 Take 1,000 Units by mouth daily.   clotrimazole 1 % vaginal cream Commonly known as: GYNE-LOTRIMIN Place 1 Applicatorful vaginally at bedtime for 3 days.   divalproex 125 MG capsule Commonly known as: DEPAKOTE SPRINKLE Take 1 capsule (125 mg total) by mouth every 12 (twelve) hours. May be swallowed whole or sprinkle capsule contents on soft food (such as  applesauce); swallow the drug/food mixture immediately (avoid chewing) and do not store for future use.   famotidine 20 MG tablet Commonly known as: Pepcid One after supper   feeding supplement Liqd Take 237 mLs by mouth 2 (two) times daily between meals.   fluticasone 50 MCG/ACT nasal spray Commonly known as: FLONASE Place 2 sprays into both nostrils daily.   gabapentin 300 MG capsule Commonly known as: NEURONTIN Take 300 mg by mouth at bedtime.   ketoconazole 2 % cream Commonly known as: NIZORAL Apply 1 Application topically daily as needed for irritation.   lamoTRIgine 200 MG tablet Commonly known as: LAMICTAL Take 200 mg by mouth daily.   latanoprost 0.005 % ophthalmic solution Commonly known as: XALATAN Place 1 drop into both eyes daily as needed.   levothyroxine 50 MCG tablet Commonly known as: SYNTHROID Take 50 mcg by mouth daily.   losartan 25 MG tablet Commonly known as: COZAAR Take 1 tablet (25 mg total) by mouth daily.   melatonin 5 MG Tabs Take 5 mg by mouth at bedtime.   metoprolol tartrate 25 MG tablet Commonly known as: LOPRESSOR Take 12.5 mg by mouth 2 (two) times daily.   montelukast 10 MG tablet Commonly known as: SINGULAIR Take 10 mg by mouth daily.   pantoprazole 40 MG tablet Commonly known as: Protonix Take 1 tablet (40 mg total) by mouth daily. Take 30-60 min before first meal of the day   PRESERVISION/LUTEIN PO Take 1 tablet by mouth 2 (  two) times daily.        Contact information for follow-up providers     Zhou-Talbert, Ralene Bathe, MD. Schedule an appointment as soon as possible for a visit in 2 week(s).   Specialty: Family Medicine Why: After discharge from the skilled nursing facility. Contact information: [address] [place] [address] [phone]              Contact information for after-discharge care     Destination     [facility] .   Service: Skilled Nursing Contact  information: [address] [place] [address] [phone]                    Discharge Exam: Ceasar Mons Weights   07/15/22 1130  Weight: 64 kg   General exam: Alert, awake, oriented x 3; no stridor and improvement in her insight and is adequately follow commands.  No chest pain, no nausea, no vomiting. Respiratory system: Clear to auscultation. Respiratory effort normal.  Good saturation on room air. Cardiovascular system:RRR. No murmurs, rubs, gallops. Gastrointestinal system: Abdomen is nondistended, soft and nontender. No organomegaly or masses felt. Normal bowel sounds heard. Central nervous system: Alert and oriented. No focal neurological deficits. Extremities: No cyanosis or clubbing. Skin: No petechiae. Psychiatry: Flat affect appreciated; no suicidal ideation or hallucinations.  Condition at discharge: Stable and in not acute distress.  The results of significant diagnostics from this hospitalization (including imaging, microbiology, ancillary and laboratory) are listed below for reference.   Imaging Studies: DG CHEST PORT 1 VIEW  Result Date: 07/15/2022 CLINICAL DATA:  Severe sepsis.  Altered mental status. EXAM: PORTABLE CHEST 1 VIEW COMPARISON:  08/03/2020 FINDINGS: Patient rotation limits examination. Shallow inspiration with elevation of left hemidiaphragm. Heart size is normal. Lungs appear clear and expanded. No pleural effusions. No pneumothorax. Mediastinal contours appear intact. Calcification of the aorta. Degenerative changes in the spine and shoulders. No change since prior study. IMPRESSION: Shallow inspiration.  No evidence of active pulmonary disease. Electronically Signed   By: Burman Nieves M.D.   On: 07/15/2022 22:44   CT HEAD WO CONTRAST  Result Date: 07/15/2022 CLINICAL DATA:  Mental status change, unknown cause EXAM: CT HEAD WITHOUT CONTRAST TECHNIQUE: Contiguous axial images were obtained from the base of the skull through the vertex  without intravenous contrast. RADIATION DOSE REDUCTION: This exam was performed according to the departmental dose-optimization program which includes automated exposure control, adjustment of the mA and/or kV according to patient size and/or use of iterative reconstruction technique. COMPARISON:  CT Head 08/03/20 FINDINGS: Brain: No evidence of acute infarction, hemorrhage, hydrocephalus, extra-axial collection or mass lesion/mass effect. Sequela of mild chronic microvascular ischemic change. Vascular: No hyperdense vessel or unexpected calcification. Skull: Normal. Negative for fracture or focal lesion. Sinuses/Orbits: No middle ear or mastoid effusion. Paranasal sinuses are clear. Bilateral lens replacement. Orbits are otherwise unremarkable. Other: None. IMPRESSION: No acute intracranial abnormality. Electronically Signed   By: Lorenza Cambridge M.D.   On: 07/15/2022 14:27    Microbiology: Results for orders placed or performed during the hospital encounter of 07/15/22  Urine Culture (for pregnant, neutropenic or urologic patients or patients with an indwelling urinary catheter)     Status: Abnormal   Collection Time: 07/15/22  4:18 PM   Specimen: Urine, Clean Catch  Result Value Ref Range Status   Specimen Description   Final    URINE, CLEAN CATCH Performed at [facility], [address] [place], [place]  [address]    Special Requests   Final    NONE Performed at [facility], [address], [place], [place] [address]    Culture MULTIPLE SPECIES PRESENT, SUGGEST RECOLLECTION (A)  Final   Report Status 07/16/2022 FINAL  Final  Culture, blood (Routine X 2) w Reflex to ID Panel     Status: None   Collection Time: 07/15/22  4:29 PM   Specimen: BLOOD  Result Value Ref Range Status   Specimen Description BLOOD BLOOD RIGHT ARM  Final   Special Requests   Final    BOTTLES DRAWN AEROBIC AND ANAEROBIC Blood Culture adequate volume   Culture   Final    NO GROWTH 5 DAYS Performed at [facility], [address] [place], [place] [address]    Report Status 07/20/2022 FINAL  Final  Culture, blood (Routine X 2) w Reflex to ID Panel     Status: None   Collection Time: 07/15/22  4:38 PM   Specimen: Site Not Specified; Blood  Result Value Ref Range Status   Specimen Description SITE NOT SPECIFIED  Final   Special Requests   Final    BOTTLES DRAWN AEROBIC AND ANAEROBIC Blood Culture adequate volume   Culture   Final    NO GROWTH 5 DAYS Performed at [facility], [address], [place], [place] [address]    Report Status 07/20/2022 FINAL  Final    Labs: CBC: Recent Labs  Lab 07/25/22 1208  WBC 10.1  NEUTROABS 5.5  HGB 14.1  HCT 42.6  MCV 95.1  PLT 313   Basic Metabolic Panel: Recent Labs  Lab 07/25/22 1208  NA 139  K 3.3*  CL 102  CO2 26  GLUCOSE 95  BUN 26*  CREATININE 0.96  CALCIUM 8.9   Liver Function Tests: Recent Labs  Lab 07/25/22 1208  AST 26  ALT 27  ALKPHOS 41  BILITOT 0.7  PROT 5.8*  ALBUMIN 3.0*   CBG: No results for input(s): "GLUCAP" in the last 168 hours.  Discharge time spent: greater than 30 minutes.  Signed: Vassie Loll, MD [facility] 07/28/2022

## 2022-07-28 NOTE — TOC Transition Note (Signed)
Transition of Care [facility]) - CM/SW Discharge Note   Patient Details  Name: MORGAN KEINATH MRN: [id number] Date of Birth: [birth year]  Transition of Care [facility]) CM/SW Contact:  Annice Needy, LCSW Phone Number: 07/28/2022, 10:56 AM   Clinical Narrative:    EMS to transport. D/c clinicals sent to facility. Son, [name], notified. Nurse to call report. TOC signing off.    Final next level of care: Skilled Nursing Facility Barriers to Discharge: No Barriers Identified   Patient Goals and CMS Choice CMS Medicare.gov Compare Post Acute Care list provided to:: Patient Represenative (must comment) Choice offered to / list presented to : Adult Children  Discharge Placement                Patient chooses bed at: [facility] Patient to be transferred to facility by: [facility] Name of family member notified: son, [name] Patient and family notified of of transfer: 07/28/22  Discharge Plan and Services Additional resources added to the After Visit Summary for   In-house Referral: Clinical Social Work   Post Acute Care Choice: Skilled Nursing Facility                               Social Determinants of Health (SDOH) Interventions SDOH Screenings   Food Insecurity: No Food Insecurity (07/15/2022)  Housing: Low Risk  (07/15/2022)  Transportation Needs: No Transportation Needs (07/15/2022)  Utilities: Not At Risk (07/15/2022)  Tobacco Use: Low Risk  (07/27/2022)     Readmission Risk Interventions     No data to display

## 2022-07-28 NOTE — Plan of Care (Signed)

## 2022-07-28 NOTE — Plan of Care (Signed)
Patient sleeping most of shift so far.  Arousable enough to safely give medications crushed in applesauce.  Strong resistance to being repositioned and crying out in bed when attempting.   Fall mats placed and bed alarm active. Problem: Clinical Measurements: Goal: Diagnostic test results will improve Outcome: Progressing Goal: Respiratory complications will improve Outcome: Progressing Goal: Cardiovascular complication will be avoided Outcome: Progressing   Problem: Activity: Goal: Risk for activity intolerance will decrease Outcome: Progressing   Problem: Coping: Goal: Level of anxiety will decrease Outcome: Progressing   Problem: Elimination: Goal: Will not experience complications related to bowel motility Outcome: Progressing   Problem: Pain Managment: Goal: General experience of comfort will improve Outcome: Progressing   Problem: Safety: Goal: Ability to remain free from injury will improve Outcome: Progressing   Problem: Skin Integrity: Goal: Risk for impaired skin integrity will decrease Outcome: Progressing   Problem: Education: Goal: Knowledge of General Education information will improve Description: Including pain rating scale, medication(s)/side effects and non-pharmacologic comfort measures Outcome: Not Progressing   Problem: Health Behavior/Discharge Planning: Goal: Ability to manage health-related needs will improve Outcome: Not Progressing   Problem: Clinical Measurements: Goal: Ability to maintain clinical measurements within normal limits will improve Outcome: Not Progressing Goal: Will remain free from infection Outcome: Not Progressing   Problem: Nutrition: Goal: Adequate nutrition will be maintained Outcome: Not Progressing   Problem: Elimination: Goal: Will not experience complications related to urinary retention Outcome: Not Progressing

## 2022-08-22 NOTE — Progress Notes (Cosign Needed Addendum)
Reason for Consult: Evaluation of bipolar disorder versus delirium has restarted multiple priors psychiatric hospitalizations.  Referring Physician:MD Eloise Harman    Location of Patient: [facility] 300 Location of Provider: Other: [facility] Urgent [facility]  [facility] MD Progress Note  For date of service 07/21/2022 Heidi Fuentes  MRN:  [id number]  Subjective:  Heidi Fuentes 77 year old female was seen and evaluated.  Video telehealth assessment.  She continues to present disorganized tangential pressured and irritable.  Unable to answer orientation questions on to state " I am going to fire you."  Expletives.  Patient was initiated on Zyprexa 5 mg nightly however was reported that patient continues to refuse medications.  Chart review patient has not been resting well at night.  Medication adjustment with Zyprexa 2.5 mg daily and Zyprexa 5 mg p.o. nightly.  Consideration for involuntary commitment.  Will continue to recommend inpatient admission once medically cleared.  Support encouragement and reassurance was provided.  Per initial admission assessment:on the brief narrative: "Heidi Fuentes is a 77 y.o. female with medical history significant for asthma, bipolar disorder, chronic pain, hypertension, bronchiectasis.Patient was brought to the ED via EMS reports of altered mental status.  Patient was admitted for acute metabolic encephalopathy in the setting of severe sepsis likely due to UTI.  UTI has now resolved, but she continues to have some delirium versus psychiatric issues that are persisting.  She will require psychiatric evaluation."    Principal Problem: Delirium Diagnosis: Principal Problem:   Delirium Active Problems:   Essential hypertension   Severe sepsis (HCC)   UTI (urinary tract infection)   Encephalopathy, metabolic   Mixed bipolar I disorder (HCC)   Depression, major, recurrent, mild (HCC)   Agitation  Total Time spent with patient: 15 minutes  Past Psychiatric History:    Past Medical History:  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[facility])     Past Surgical History:  Procedure Laterality Date   CATARACT EXTRACTION W/PHACO Right 10/06/2016   Procedure: CATARACT EXTRACTION PHACO AND INTRAOCULAR LENS PLACEMENT (IOC);  Surgeon: Galen Manila, MD;  Location: [facility] ORS;  Service: Ophthalmology;  Laterality: Right;  [place] 00:32.5 AP% 14.5 CDE 4.71 Fluid pack lot # [id number]   CATARACT EXTRACTION W/PHACO Left 11/03/2016   Procedure: CATARACT EXTRACTION PHACO AND INTRAOCULAR LENS PLACEMENT (IOC);  Surgeon: Galen Manila, MD;  Location: [facility] ORS;  Service: Ophthalmology;  Laterality: Left;  [place] 00:36.6 AP% 16.0 CDE 5.86 Fluid Pack lot # [id number]   COCCYX REMOVAL     colonoscopy  2005   Dr. Karilyn Cota: mild melanosis coli, otherwise normal   COLONOSCOPY WITH PROPOFOL N/A 09/14/2013   EXB:[id number] coli. Colonic diverticulosis. Single colonic. Tubular adenoma. Next TCS 09/2020.   COLONOSCOPY WITH PROPOFOL N/A 07/24/2021   Procedure: COLONOSCOPY WITH PROPOFOL;  Surgeon: Jena Gauss,  Gerrit Friends, MD;  Location: [facility];  Service: Endoscopy;  Laterality: N/A;  2:00pm   ERCP  1997   [facility]: biliary manometry abnormal, subsequent sphincterotomy    ESOPHAGEAL MANOMETRY N/A 12/21/2016   Surgeon: Napoleon Form, MD; EG junction outflow obstruction which could be secondary to large hiatal hernia and possible coiling of catheter.  Not consistent with  achalasia or other variant.  No major peristaltic motility disorder.   ESOPHAGOGASTRODUODENOSCOPY (EGD) WITH PROPOFOL N/A 09/14/2013   JXB:[id number] ring. Hiatal hernia. Status post Elease Hashimoto and biopsy disruption.    FOOT SURGERY     X2   HEMORROIDECTOMY     HERNIA REPAIR     inguinal right   HERNIA REPAIR     LAPAROSCOPIC PARAESOPHAGEAL HERNIA REPAIR  02/2017   Dr. Carolynn Sayers [facility])   [place] DILATION N/A 09/14/2013   Procedure: Alvy Beal;  Surgeon: Corbin Ade, MD;  Location: [facility] ORS;  Service: Endoscopy;  Laterality: N/A;  56   POLYPECTOMY N/A 09/14/2013   Procedure: POLYPECTOMY;  Surgeon: Corbin Ade, MD;  Location: [facility] ORS;  Service: Endoscopy;  Laterality: N/A;   POLYPECTOMY  07/24/2021   Procedure: POLYPECTOMY;  Surgeon: Corbin Ade, MD;  Location: [facility];  Service: Endoscopy;;   TONSILLECTOMY     Family History:  Family History  Problem Relation Age of Onset   Lung cancer Mother    Heart attack Father    Bone cancer Brother    COPD Sister    Colon cancer Neg Hx    Family Psychiatric  History:  Social History:  Social History   Substance and Sexual Activity  Alcohol Use No     Social History   Substance and Sexual Activity  Drug Use No    Social History   Socioeconomic History   Marital status: Divorced    Spouse name: Not on file   Number of children: Not on file   Years of education: Not on file   Highest education level: Not on file  Occupational History   Not on file  Tobacco Use   Smoking status: Never   Smokeless tobacco: Never   Tobacco comments:    NEVER SMOKED  Vaping Use   Vaping status: Never Used  Substance and Sexual Activity   Alcohol use: No   Drug use: No   Sexual activity: Not on file  Other Topics Concern   Not on file  Social History Narrative   Not on file   Social Determinants of Health   Financial Resource Strain: Not on file  Food Insecurity: No Food Insecurity (07/15/2022)   Hunger  Vital Sign    Worried About Running Out of Food in the Last Year: Never true    Ran Out of Food in the Last Year: Never true  Transportation Needs: No Transportation Needs (07/15/2022)   PRAPARE - Administrator, Civil Service (Medical): No    Lack of Transportation (Non-Medical): No  Physical Activity: Not on file  Stress: Not on file  Social Connections: Not on file   Additional Social History:                         Sleep: Poor  Appetite:  Fair  Current Medications: No current facility-administered medications for this encounter.   Current Outpatient Medications  Medication Sig Dispense Refill   acetaminophen (TYLENOL) 500 MG tablet Take 500 mg by mouth every 6 (six) hours  as needed for mild pain or moderate pain. For pain     albuterol (VENTOLIN HFA) 108 (90 Base) MCG/ACT inhaler Inhale 1 puff into the lungs every 4 (four) hours as needed for wheezing or shortness of breath. For shortness of breath     beclomethasone (QVAR) 80 MCG/ACT inhaler Inhale 1 puff into the lungs daily. 3 each 1   Budeson-Glycopyrrol-Formoterol (BREZTRI AEROSPHERE) 160-9-4.8 MCG/ACT AERO Inhale 2 puffs into the lungs in the morning and at bedtime. (Patient taking differently: Inhale 1 puff into the lungs in the morning and at bedtime.) 10.7 g 3   cetirizine (ZYRTEC) 5 MG tablet Take 5 mg by mouth at bedtime.     cholecalciferol (VITAMIN D3) 25 MCG (1000 UNIT) tablet Take 1,000 Units by mouth daily.     fluticasone (FLONASE) 50 MCG/ACT nasal spray Place 2 sprays into both nostrils daily.     gabapentin (NEURONTIN) 300 MG capsule Take 300 mg by mouth at bedtime.     ketoconazole (NIZORAL) 2 % cream Apply 1 Application topically daily as needed for irritation.     lamoTRIgine (LAMICTAL) 200 MG tablet Take 200 mg by mouth daily.     latanoprost (XALATAN) 0.005 % ophthalmic solution Place 1 drop into both eyes daily as needed.     levothyroxine (SYNTHROID, LEVOTHROID) 50 MCG tablet Take  50 mcg by mouth daily.     losartan (COZAAR) 25 MG tablet Take 1 tablet (25 mg total) by mouth daily.     melatonin 5 MG TABS Take 5 mg by mouth at bedtime.     metoprolol tartrate (LOPRESSOR) 25 MG tablet Take 12.5 mg by mouth 2 (two) times daily.     montelukast (SINGULAIR) 10 MG tablet Take 10 mg by mouth daily.     Multiple Vitamins-Minerals (PRESERVISION/LUTEIN PO) Take 1 tablet by mouth 2 (two) times daily.      albuterol (PROVENTIL) (2.5 MG/3ML) 0.083% nebulizer solution Take 3 mLs (2.5 mg total) by nebulization every 6 (six) hours as needed for wheezing or shortness of breath. 75 mL 12   benztropine (COGENTIN) 1 MG tablet Take 1 tablet (1 mg total) by mouth 2 (two) times daily. 60 tablet 1   buPROPion (WELLBUTRIN) 75 MG tablet Take 1 tablet (75 mg total) by mouth 2 (two) times daily.     chlorhexidine (PERIDEX) 0.12 % solution Use as directed 15 mLs in the mouth or throat 2 (two) times daily.     divalproex (DEPAKOTE SPRINKLE) 125 MG capsule Take 1 capsule (125 mg total) by mouth every 12 (twelve) hours. May be swallowed whole or sprinkle capsule contents on soft food (such as applesauce); swallow the drug/food mixture immediately (avoid chewing) and do not store for future use. 60 capsule 1   famotidine (PEPCID) 20 MG tablet One after supper 30 tablet 11   feeding supplement (ENSURE ENLIVE / ENSURE PLUS) LIQD Take 237 mLs by mouth 2 (two) times daily between meals.     pantoprazole (PROTONIX) 40 MG tablet Take 1 tablet (40 mg total) by mouth daily. Take 30-60 min before first meal of the day 30 tablet 2    Lab Results:  No results found for this or any previous visit (from the past 48 hour(s)).   Blood Alcohol level:  Lab Results  Component Value Date   [facility] <10 07/15/2022   ETH <10 08/06/2020    Metabolic Disorder Labs: Lab Results  Component Value Date   HGBA1C 5.7 (H) 07/09/2013   MPG 117 (H)  07/09/2013   No results found for: "PROLACTIN" No results found for: "CHOL",  "TRIG", "HDL", "CHOLHDL", "VLDL", "LDLCALC"  Physical Findings: AIMS:  , ,  ,  ,    CIWA:    COWS:     Musculoskeletal:   Psychiatric Specialty Exam:  Presentation  General Appearance:  Appropriate for Environment  Eye Contact: Minimal  Speech: Clear and Coherent; Slow  Speech Volume: Decreased  Handedness: Right   Mood and Affect  Mood: Dysphoric  Affect: Congruent   Thought Process  Thought Processes: Coherent; Linear  Descriptions of Associations:Circumstantial  Orientation:Other (comment) (she is oriented to self, able to give the name of hospital, current city, state)  Thought Content:Other (comment) (liner)  History of Schizophrenia/Schizoaffective disorder:No data recorded Duration of Psychotic Symptoms:No data recorded Hallucinations:No data recorded  Ideas of Reference:None  Suicidal Thoughts:No data recorded  Homicidal Thoughts:No data recorded   Sensorium  Memory:Immediate Fair; Recent Fair  Judgment: Fair  Insight: Lacking   Executive Functions  Concentration: Poor  Attention Span: Poor  Recall: Poor  Fund of Knowledge: Poor  Language: Poor   Psychomotor Activity  Psychomotor Activity: No data recorded   Assets  Assets: Desire for Improvement; Financial Resources/Insurance; Housing   Sleep  Sleep: No data recorded    Physical Exam: Physical Exam Vitals reviewed.  Constitutional:      Appearance: Normal appearance.  Neurological:     Mental Status: She is alert and oriented to person, place, and time.  Psychiatric:        Mood and Affect: Mood normal.        Behavior: Behavior normal.    Review of Systems  Psychiatric/Behavioral:  The patient is nervous/anxious.   All other systems reviewed and are negative.  Blood pressure 131/86, pulse 61, temperature 98.3 F (36.8 C), temperature source Axillary, resp. rate 17, height 5' (1.524 m), weight 64 kg, SpO2 95%. Body mass index is 27.56  kg/m.   Treatment Plan Summary: Daily contact with patient to assess and evaluate symptoms and progress in treatment and Medication management  CSW to continue seeking inpatient admission Patient to start Zyprexa 2.5 mg daily and 5 mg nightly  Oneta Rack, NP For date of services 07/21/2022

## 2022-08-22 NOTE — Progress Notes (Signed)
Reason for Consult: Evaluation of bipolar disorder versus delirium has restarted multiple priors psychiatric hospitalizations.  Referring Physician:MD Eloise Harman    Location of Patient: [facility] Location of Provider: Other: [facility] Urgent care  [facility] MD Progress Note  For date of service 07/20/2022 Heidi Fuentes  MRN:  [id number]  Subjective:  Heidi Fuentes 77 year old female was seen and evaluated.  Video telehealth assessment.  She continues to present disorganized tangential pressured and irritable.  Unable to answer orientation questions on to state " I am going to fire you."  Expletives.  Patient was initiated on Zyprexa 5 mg nightly however was reported that patient continues to refuse medications.  Chart review patient has not been resting well at night.  Medication adjustment with Zyprexa 2.5 mg daily and Zyprexa 5 mg p.o. nightly.  Consideration for involuntary commitment.  Will continue to recommend inpatient admission once medically cleared.  Support encouragement and reassurance was provided.  Per initial admission assessment:on the brief narrative: "Heidi Fuentes is a 77 y.o. female with medical history significant for asthma, bipolar disorder, chronic pain, hypertension, bronchiectasis.Patient was brought to the ED via EMS reports of altered mental status.  Patient was admitted for acute metabolic encephalopathy in the setting of severe sepsis likely due to UTI.  UTI has now resolved, but she continues to have some delirium versus psychiatric issues that are persisting.  She will require psychiatric evaluation."    Principal Problem: Delirium Diagnosis: Principal Problem:   Delirium Active Problems:   Essential hypertension   Severe sepsis (HCC)   UTI (urinary tract infection)   Encephalopathy, metabolic   Mixed bipolar I disorder (HCC)   Depression, major, recurrent, mild (HCC)   Agitation  Total Time spent with patient: 15 minutes  Past Psychiatric History:    Past Medical History:  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[facility]     Past Surgical History:  Procedure Laterality Date   CATARACT EXTRACTION W/PHACO Right 10/06/2016   Procedure: CATARACT EXTRACTION PHACO AND INTRAOCULAR LENS PLACEMENT (IOC);  Surgeon: Galen Manila, MD;  Location: [facility] ORS;  Service: Ophthalmology;  Laterality: Right;  [place] 00:32.5 AP% 14.5 CDE 4.71 Fluid pack lot # [id number]   CATARACT EXTRACTION W/PHACO Left 11/03/2016   Procedure: CATARACT EXTRACTION PHACO AND INTRAOCULAR LENS PLACEMENT (IOC);  Surgeon: Galen Manila, MD;  Location: [facility] ORS;  Service: Ophthalmology;  Laterality: Left;  [place] 00:36.6 AP% 16.0 CDE 5.86 Fluid Pack lot # [id number]   COCCYX REMOVAL     colonoscopy  2005   Dr. Karilyn Cota: mild melanosis coli, otherwise normal   COLONOSCOPY WITH PROPOFOL N/A 09/14/2013   WUX:[id number] coli. Colonic diverticulosis. Single colonic. Tubular adenoma. Next TCS 09/2020.   COLONOSCOPY WITH PROPOFOL N/A 07/24/2021   Procedure: COLONOSCOPY WITH PROPOFOL;  Surgeon: Jena Gauss,  Gerrit Friends, MD;  Location: [facility];  Service: Endoscopy;  Laterality: N/A;  2:00pm   ERCP  1997   [facility]: biliary manometry abnormal, subsequent sphincterotomy    ESOPHAGEAL MANOMETRY N/A 12/21/2016   Surgeon: Napoleon Form, MD; EG junction outflow obstruction which could be secondary to large hiatal hernia and possible coiling of catheter.  Not consistent with  achalasia or other variant.  No major peristaltic motility disorder.   ESOPHAGOGASTRODUODENOSCOPY (EGD) WITH PROPOFOL N/A 09/14/2013   [id number] ring. Hiatal hernia. Status post Elease Hashimoto and biopsy disruption.    FOOT SURGERY     X2   HEMORROIDECTOMY     HERNIA REPAIR     inguinal right   HERNIA REPAIR     LAPAROSCOPIC PARAESOPHAGEAL HERNIA REPAIR  02/2017   Dr. Carolynn Sayers [facility])   [place] DILATION N/A 09/14/2013   Procedure: Alvy Beal;  Surgeon: Corbin Ade, MD;  Location: [facility]S;  Service: Endoscopy;  Laterality: N/A;  56   POLYPECTOMY N/A 09/14/2013   Procedure: POLYPECTOMY;  Surgeon: Corbin Ade, MD;  Location: [facility] ORS;  Service: Endoscopy;  Laterality: N/A;   POLYPECTOMY  07/24/2021   Procedure: POLYPECTOMY;  Surgeon: Corbin Ade, MD;  Location: [facility];  Service: Endoscopy;;   TONSILLECTOMY     Family History:  Family History  Problem Relation Age of Onset   Lung cancer Mother    Heart attack Father    Bone cancer Brother    COPD Sister    Colon cancer Neg Hx    Family Psychiatric  History:  Social History:  Social History   Substance and Sexual Activity  Alcohol Use No     Social History   Substance and Sexual Activity  Drug Use No    Social History   Socioeconomic History   Marital status: Divorced    Spouse name: Not on file   Number of children: Not on file   Years of education: Not on file   Highest education level: Not on file  Occupational History   Not on file  Tobacco Use   Smoking status: Never   Smokeless tobacco: Never   Tobacco comments:    NEVER SMOKED  Vaping Use   Vaping status: Never Used  Substance and Sexual Activity   Alcohol use: No   Drug use: No   Sexual activity: Not on file  Other Topics Concern   Not on file  Social History Narrative   Not on file   Social Determinants of Health   Financial Resource Strain: Not on file  Food Insecurity: No Food Insecurity (07/15/2022)   Hunger  Vital Sign    Worried About Running Out of Food in the Last Year: Never true    Ran Out of Food in the Last Year: Never true  Transportation Needs: No Transportation Needs (07/15/2022)   PRAPARE - Administrator, Civil Service (Medical): No    Lack of Transportation (Non-Medical): No  Physical Activity: Not on file  Stress: Not on file  Social Connections: Not on file   Additional Social History:                         Sleep: Poor  Appetite:  Fair  Current Medications: No current facility-administered medications for this encounter.   Current Outpatient Medications  Medication Sig Dispense Refill   acetaminophen (TYLENOL) 500 MG tablet Take 500 mg by mouth every 6 (six) hours  as needed for mild pain or moderate pain. For pain     albuterol (VENTOLIN HFA) 108 (90 Base) MCG/ACT inhaler Inhale 1 puff into the lungs every 4 (four) hours as needed for wheezing or shortness of breath. For shortness of breath     beclomethasone (QVAR) 80 MCG/ACT inhaler Inhale 1 puff into the lungs daily. 3 each 1   Budeson-Glycopyrrol-Formoterol (BREZTRI AEROSPHERE) 160-9-4.8 MCG/ACT AERO Inhale 2 puffs into the lungs in the morning and at bedtime. (Patient taking differently: Inhale 1 puff into the lungs in the morning and at bedtime.) 10.7 g 3   cetirizine (ZYRTEC) 5 MG tablet Take 5 mg by mouth at bedtime.     cholecalciferol (VITAMIN D3) 25 MCG (1000 UNIT) tablet Take 1,000 Units by mouth daily.     fluticasone (FLONASE) 50 MCG/ACT nasal spray Place 2 sprays into both nostrils daily.     gabapentin (NEURONTIN) 300 MG capsule Take 300 mg by mouth at bedtime.     ketoconazole (NIZORAL) 2 % cream Apply 1 Application topically daily as needed for irritation.     lamoTRIgine (LAMICTAL) 200 MG tablet Take 200 mg by mouth daily.     latanoprost (XALATAN) 0.005 % ophthalmic solution Place 1 drop into both eyes daily as needed.     levothyroxine (SYNTHROID, LEVOTHROID) 50 MCG tablet Take  50 mcg by mouth daily.     losartan (COZAAR) 25 MG tablet Take 1 tablet (25 mg total) by mouth daily.     melatonin 5 MG TABS Take 5 mg by mouth at bedtime.     metoprolol tartrate (LOPRESSOR) 25 MG tablet Take 12.5 mg by mouth 2 (two) times daily.     montelukast (SINGULAIR) 10 MG tablet Take 10 mg by mouth daily.     Multiple Vitamins-Minerals (PRESERVISION/LUTEIN PO) Take 1 tablet by mouth 2 (two) times daily.      albuterol (PROVENTIL) (2.5 MG/3ML) 0.083% nebulizer solution Take 3 mLs (2.5 mg total) by nebulization every 6 (six) hours as needed for wheezing or shortness of breath. 75 mL 12   benztropine (COGENTIN) 1 MG tablet Take 1 tablet (1 mg total) by mouth 2 (two) times daily. 60 tablet 1   buPROPion (WELLBUTRIN) 75 MG tablet Take 1 tablet (75 mg total) by mouth 2 (two) times daily.     chlorhexidine (PERIDEX) 0.12 % solution Use as directed 15 mLs in the mouth or throat 2 (two) times daily.     divalproex (DEPAKOTE SPRINKLE) 125 MG capsule Take 1 capsule (125 mg total) by mouth every 12 (twelve) hours. May be swallowed whole or sprinkle capsule contents on soft food (such as applesauce); swallow the drug/food mixture immediately (avoid chewing) and do not store for future use. 60 capsule 1   famotidine (PEPCID) 20 MG tablet One after supper 30 tablet 11   feeding supplement (ENSURE ENLIVE / ENSURE PLUS) LIQD Take 237 mLs by mouth 2 (two) times daily between meals.     pantoprazole (PROTONIX) 40 MG tablet Take 1 tablet (40 mg total) by mouth daily. Take 30-60 min before first meal of the day 30 tablet 2    Lab Results:  No results found for this or any previous visit (from the past 48 hour(s)).   Blood Alcohol level:  Lab Results  Component Value Date   [facility] <10 07/15/2022   ETH <10 08/06/2020    Metabolic Disorder Labs: Lab Results  Component Value Date   HGBA1C 5.7 (H) 07/09/2013   MPG 117 (H)  07/09/2013   No results found for: "PROLACTIN" No results found for: "CHOL",  "TRIG", "HDL", "CHOLHDL", "VLDL", "LDLCALC"  Physical Findings: AIMS:  , ,  ,  ,    CIWA:    COWS:     Musculoskeletal:   Psychiatric Specialty Exam:  Presentation  General Appearance:  Appropriate for Environment  Eye Contact: Minimal  Speech: Clear and Coherent; Slow  Speech Volume: Decreased  Handedness: Right   Mood and Affect  Mood: Dysphoric  Affect: Congruent   Thought Process  Thought Processes: Coherent; Linear  Descriptions of Associations:Circumstantial  Orientation:Other (comment) (she is oriented to self, able to give the name of hospital, current city, state)  Thought Content:Other (comment) (liner)  History of Schizophrenia/Schizoaffective disorder:No data recorded Duration of Psychotic Symptoms:No data recorded Hallucinations:No data recorded  Ideas of Reference:None  Suicidal Thoughts:No data recorded  Homicidal Thoughts:No data recorded   Sensorium  Memory:Immediate Fair; Recent Fair  Judgment: Fair  Insight: Lacking   Executive Functions  Concentration: Poor  Attention Span: Poor  Recall: Poor  Fund of Knowledge: Poor  Language: Poor   Psychomotor Activity  Psychomotor Activity: No data recorded   Assets  Assets: Desire for Improvement; Financial Resources/Insurance; Housing   Sleep  Sleep: No data recorded    Physical Exam: Physical Exam Vitals reviewed.  Constitutional:      Appearance: Normal appearance.  Neurological:     Mental Status: She is alert and oriented to person, place, and time.  Psychiatric:        Mood and Affect: Mood normal.        Behavior: Behavior normal.    Review of Systems  Psychiatric/Behavioral:  The patient is nervous/anxious.   All other systems reviewed and are negative.  Blood pressure 131/86, pulse 61, temperature 98.3 F (36.8 C), temperature source Axillary, resp. rate 17, height 5' (1.524 m), weight 64 kg, SpO2 95%. Body mass index is 27.56  kg/m.   Treatment Plan Summary: Daily contact with patient to assess and evaluate symptoms and progress in treatment and Medication management  CSW to continue seeking inpatient admission Patient to start Zyprexa 2.5 mg daily and 5 mg nightly  Oneta Rack, NP For dated of service 07/20/2022

## 2022-08-22 NOTE — Progress Notes (Signed)
Reason for Consult: Evaluation of bipolar disorder versus delirium has restarted multiple priors psychiatric hospitalizations.  Referring Physician:MD Eloise Harman    Location of Patient: [facility] Location of Provider: Other: [facility]   [facility] MD Progress Note  For date of service on 07/24/2022 Heidi Fuentes  MRN:  [id number] Subjective: Heidi Fuentes 77 year old Caucasian female presented due to UTI, sepsis and acute metabolic encephalopathy.  Patient has medical history related to hypertension, bronchiolectasis and chronic pain.  Shakedra was seen and evaluated via teleassessment.  Patient observed resting in bed appears to have limited range of movement.  Per nursing staff patient is able to to ambulate with 1+ assist to the bathroom.  Was reported that patient has been refusing physical therapy.  She is awake, alert and oriented to place and person.  Was charted that patient's baseline is slight confusion was reported that patient has been taking medication as indicated. She is denying suicidal or homicidal ideations.  Patient's thought process has improved compared to initial admission assessment.   Chart reviewed orders placed for CBC, CMP, Depakote level and pending results for lamotrigine level.  Anticipate clearance from psychiatry consults pending lab results.  Patient to be evaluated in person on 07/26/2022.  Patient was initiated on Zyprexa 2.5 mg daily and 5 mg nightly.  Cogentin 0.5 mg p.o. twice daily.  Lamictal 200 mg. consulted with MD Sharman Crate, now dose of cogentin 2 mg and reassess.    Principal Problem: Delirium Diagnosis: Principal Problem:   Delirium Active Problems:   Essential hypertension   Severe sepsis (HCC)   UTI (urinary tract infection)   Encephalopathy, metabolic   Mixed bipolar I disorder (HCC)   Depression, major, recurrent, mild (HCC)   Agitation  Total Time spent with patient: 15 minutes  Past Psychiatric History:   Past Medical History:   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[facility])     Past Surgical History:  Procedure Laterality Date   CATARACT EXTRACTION W/PHACO Right 10/06/2016   Procedure: CATARACT EXTRACTION PHACO AND INTRAOCULAR LENS PLACEMENT (IOC);  Surgeon: Galen Manila, MD;  Location: [facility] ORS;  Service: Ophthalmology;  Laterality: Right;  [place] 00:32.5 AP% 14.5 CDE 4.71 Fluid pack lot # [id number]   CATARACT EXTRACTION W/PHACO Left 11/03/2016   Procedure: CATARACT EXTRACTION PHACO AND INTRAOCULAR LENS PLACEMENT (IOC);  Surgeon: Galen Manila, MD;  Location: [facility] ORS;  Service: Ophthalmology;  Laterality: Left;  [place] 00:36.6 AP% 16.0 CDE 5.86 Fluid Pack lot # [id number]   COCCYX REMOVAL     colonoscopy  2005   Dr. Karilyn Cota: mild melanosis coli, otherwise normal   COLONOSCOPY WITH PROPOFOL N/A 09/14/2013   OAC:ZYSAYTKZS coli. Colonic diverticulosis. Single colonic. Tubular adenoma. Next TCS 09/2020.   COLONOSCOPY WITH PROPOFOL N/A 07/24/2021   Procedure: COLONOSCOPY  WITH PROPOFOL;  Surgeon: Corbin Ade, MD;  Location: [facility];  Service: Endoscopy;  Laterality: N/A;  2:00pm   ERCP  1997   [facility]: biliary manometry abnormal, subsequent sphincterotomy    ESOPHAGEAL MANOMETRY N/A 12/21/2016   Surgeon: Napoleon Form, MD; EG junction outflow obstruction which could be secondary to large hiatal hernia and possible coiling of catheter.  Not consistent with achalasia or other variant.   No major peristaltic motility disorder.   ESOPHAGOGASTRODUODENOSCOPY (EGD) WITH PROPOFOL N/A 09/14/2013   YQM:VHQIONGE'X ring. Hiatal hernia. Status post Elease Hashimoto and biopsy disruption.    FOOT SURGERY     X2   HEMORROIDECTOMY     HERNIA REPAIR     inguinal right   HERNIA REPAIR     LAPAROSCOPIC PARAESOPHAGEAL HERNIA REPAIR  02/2017   Dr. Carolynn Sayers [facility])   [place] DILATION N/A 09/14/2013   Procedure: Alvy Beal;  Surgeon: Corbin Ade, MD;  Location: [facility] ORS;  Service: Endoscopy;  Laterality: N/A;  56   POLYPECTOMY N/A 09/14/2013   Procedure: POLYPECTOMY;  Surgeon: Corbin Ade, MD;  Location: [facility] ORS;  Service: Endoscopy;  Laterality: N/A;   POLYPECTOMY  07/24/2021   Procedure: POLYPECTOMY;  Surgeon: Corbin Ade, MD;  Location: [facility];  Service: Endoscopy;;   TONSILLECTOMY     Family History:  Family History  Problem Relation Age of Onset   Lung cancer Mother    Heart attack Father    Bone cancer Brother    COPD Sister    Colon cancer Neg Hx    Family Psychiatric  History:  Social History:  Social History   Substance and Sexual Activity  Alcohol Use No     Social History   Substance and Sexual Activity  Drug Use No    Social History   Socioeconomic History   Marital status: Divorced    Spouse name: Not on file   Number of children: Not on file   Years of education: Not on file   Highest education level: Not on file  Occupational History   Not on file  Tobacco Use   Smoking status: Never   Smokeless tobacco: Never   Tobacco comments:    NEVER SMOKED  Vaping Use   Vaping status: Never Used  Substance and Sexual Activity   Alcohol use: No   Drug use: No   Sexual activity: Not on file  Other Topics Concern   Not on file  Social History Narrative   Not on file   Social Determinants of Health   Financial Resource Strain: Not on file  Food Insecurity: No Food Insecurity (07/15/2022)   Hunger Vital Sign    Worried About  Running Out of Food in the Last Year: Never true    Ran Out of Food in the Last Year: Never true  Transportation Needs: No Transportation Needs (07/15/2022)   PRAPARE - Administrator, Civil Service (Medical): No    Lack of Transportation (Non-Medical): No  Physical Activity: Not on file  Stress: Not on file  Social Connections: Not on file   Additional Social History:                         Sleep: Fair  Appetite:  Fair  Current Medications: No current facility-administered medications for this encounter.   Current Outpatient Medications  Medication Sig Dispense Refill   acetaminophen (TYLENOL) 500 MG tablet Take 500 mg by  mouth every 6 (six) hours as needed for mild pain or moderate pain. For pain     albuterol (VENTOLIN HFA) 108 (90 Base) MCG/ACT inhaler Inhale 1 puff into the lungs every 4 (four) hours as needed for wheezing or shortness of breath. For shortness of breath     beclomethasone (QVAR) 80 MCG/ACT inhaler Inhale 1 puff into the lungs daily. 3 each 1   Budeson-Glycopyrrol-Formoterol (BREZTRI AEROSPHERE) 160-9-4.8 MCG/ACT AERO Inhale 2 puffs into the lungs in the morning and at bedtime. (Patient taking differently: Inhale 1 puff into the lungs in the morning and at bedtime.) 10.7 g 3   cetirizine (ZYRTEC) 5 MG tablet Take 5 mg by mouth at bedtime.     cholecalciferol (VITAMIN D3) 25 MCG (1000 UNIT) tablet Take 1,000 Units by mouth daily.     fluticasone (FLONASE) 50 MCG/ACT nasal spray Place 2 sprays into both nostrils daily.     gabapentin (NEURONTIN) 300 MG capsule Take 300 mg by mouth at bedtime.     ketoconazole (NIZORAL) 2 % cream Apply 1 Application topically daily as needed for irritation.     lamoTRIgine (LAMICTAL) 200 MG tablet Take 200 mg by mouth daily.     latanoprost (XALATAN) 0.005 % ophthalmic solution Place 1 drop into both eyes daily as needed.     levothyroxine (SYNTHROID, LEVOTHROID) 50 MCG tablet Take 50 mcg by mouth daily.      losartan (COZAAR) 25 MG tablet Take 1 tablet (25 mg total) by mouth daily.     melatonin 5 MG TABS Take 5 mg by mouth at bedtime.     metoprolol tartrate (LOPRESSOR) 25 MG tablet Take 12.5 mg by mouth 2 (two) times daily.     montelukast (SINGULAIR) 10 MG tablet Take 10 mg by mouth daily.     Multiple Vitamins-Minerals (PRESERVISION/LUTEIN PO) Take 1 tablet by mouth 2 (two) times daily.      albuterol (PROVENTIL) (2.5 MG/3ML) 0.083% nebulizer solution Take 3 mLs (2.5 mg total) by nebulization every 6 (six) hours as needed for wheezing or shortness of breath. 75 mL 12   benztropine (COGENTIN) 1 MG tablet Take 1 tablet (1 mg total) by mouth 2 (two) times daily. 60 tablet 1   buPROPion (WELLBUTRIN) 75 MG tablet Take 1 tablet (75 mg total) by mouth 2 (two) times daily.     chlorhexidine (PERIDEX) 0.12 % solution Use as directed 15 mLs in the mouth or throat 2 (two) times daily.     divalproex (DEPAKOTE SPRINKLE) 125 MG capsule Take 1 capsule (125 mg total) by mouth every 12 (twelve) hours. May be swallowed whole or sprinkle capsule contents on soft food (such as applesauce); swallow the drug/food mixture immediately (avoid chewing) and do not store for future use. 60 capsule 1   famotidine (PEPCID) 20 MG tablet One after supper 30 tablet 11   feeding supplement (ENSURE ENLIVE / ENSURE PLUS) LIQD Take 237 mLs by mouth 2 (two) times daily between meals.     pantoprazole (PROTONIX) 40 MG tablet Take 1 tablet (40 mg total) by mouth daily. Take 30-60 min before first meal of the day 30 tablet 2    Lab Results: No results found for this or any previous visit (from the past 48 hour(s)).  Blood Alcohol level:  Lab Results  Component Value Date   [facility] <10 07/15/2022   ETH <10 08/06/2020    Metabolic Disorder Labs: Lab Results  Component Value Date   HGBA1C 5.7 (H) 07/09/2013  MPG 117 (H) 07/09/2013   No results found for: "PROLACTIN" No results found for: "CHOL", "TRIG", "HDL", "CHOLHDL", "VLDL",  "LDLCALC"  Physical Findings: AIMS:  , ,  ,  ,    CIWA:    COWS:      Psychiatric Specialty Exam:  Presentation  General Appearance:  Appropriate for Environment  Eye Contact: Minimal  Speech: Clear and Coherent; Slow  Speech Volume: Decreased  Handedness: Right   Mood and Affect  Mood: Dysphoric  Affect: Congruent   Thought Process  Thought Processes: Coherent; Linear  Descriptions of Associations:Circumstantial  Orientation:Other (comment) (she is oriented to self, able to give the name of hospital, current city, state)  Thought Content:Other (comment) (liner)  History of Schizophrenia/Schizoaffective disorder:No data recorded Duration of Psychotic Symptoms:No data recorded Hallucinations:No data recorded  Ideas of Reference:None  Suicidal Thoughts:No data recorded  Homicidal Thoughts:No data recorded   Sensorium  Memory: Immediate Fair; Recent Fair  Judgment: Fair  Insight: Lacking   Executive Functions  Concentration: Poor  Attention Span: Poor  Recall: Poor  Fund of Knowledge: Poor  Language: Poor   Psychomotor Activity  Psychomotor Activity: No data recorded   Assets  Assets: Desire for Improvement; Financial Resources/Insurance; Housing   Sleep  Sleep: No data recorded    Physical Exam: Physical Exam Vitals reviewed.    Review of Systems  All other systems reviewed and are negative.  Blood pressure 131/86, pulse 61, temperature 98.3 F (36.8 C), temperature source Axillary, resp. rate 17, height 5' (1.524 m), weight 64 kg, SpO2 95%. Body mass index is 27.56 kg/m.   Treatment Plan Summary: Daily contact with patient to assess and evaluate symptoms and progress in treatment and Medication management  Now dose of cogentin 2 mg- patient to be evaluated by psychiatry face to face on 6/23. Increased cogentin 0.5 mg to 1 mg po bid Continue Zyprex 2.5 mg daily and will decrease Zyprexa 5 mg nightly to  Zyprexa 2.5 mg nightly  Reordered: CBC, CMP Depakote level and please update chart with EKG. Anticipate patient will be psychiatrically cleared pending lab results on 07/26/2022 -Will place a TOC consult to consider SNF placement.Oneta Rack, NP 08/22/2022, 10:46 AM

## 2022-09-08 ENCOUNTER — Encounter (HOSPITAL_COMMUNITY): Payer: Medicare Other

## 2022-11-09 ENCOUNTER — Encounter (HOSPITAL_COMMUNITY): Payer: Self-pay | Admitting: *Deleted

## 2022-11-09 ENCOUNTER — Other Ambulatory Visit: Payer: Self-pay

## 2022-11-09 ENCOUNTER — Emergency Department (HOSPITAL_COMMUNITY): Payer: Medicare Other

## 2022-11-09 ENCOUNTER — Inpatient Hospital Stay (HOSPITAL_COMMUNITY)
Admission: EM | Admit: 2022-11-09 | Discharge: 2022-11-12 | DRG: 178 | Disposition: A | Discharge status: Home Health | Payer: Medicare Other | Attending: Family Medicine | Admitting: Family Medicine

## 2022-11-09 DIAGNOSIS — F319 Bipolar disorder, unspecified: Secondary | ICD-10-CM | POA: Diagnosis present

## 2022-11-09 DIAGNOSIS — Z7951 Long term (current) use of inhaled steroids: Secondary | ICD-10-CM

## 2022-11-09 DIAGNOSIS — Z885 Allergy status to narcotic agent status: Secondary | ICD-10-CM | POA: Diagnosis not present

## 2022-11-09 DIAGNOSIS — S32029A Unspecified fracture of second lumbar vertebra, initial encounter for closed fracture: Secondary | ICD-10-CM | POA: Diagnosis present

## 2022-11-09 DIAGNOSIS — Z888 Allergy status to other drugs, medicaments and biological substances status: Secondary | ICD-10-CM | POA: Diagnosis not present

## 2022-11-09 DIAGNOSIS — K219 Gastro-esophageal reflux disease without esophagitis: Secondary | ICD-10-CM

## 2022-11-09 DIAGNOSIS — M797 Fibromyalgia: Secondary | ICD-10-CM | POA: Diagnosis present

## 2022-11-09 DIAGNOSIS — Z801 Family history of malignant neoplasm of trachea, bronchus and lung: Secondary | ICD-10-CM | POA: Diagnosis not present

## 2022-11-09 DIAGNOSIS — Z882 Allergy status to sulfonamides status: Secondary | ICD-10-CM

## 2022-11-09 DIAGNOSIS — Z79890 Hormone replacement therapy (postmenopausal): Secondary | ICD-10-CM | POA: Diagnosis not present

## 2022-11-09 DIAGNOSIS — J189 Pneumonia, unspecified organism: Principal | ICD-10-CM

## 2022-11-09 DIAGNOSIS — J690 Pneumonitis due to inhalation of food and vomit: Principal | ICD-10-CM | POA: Diagnosis present

## 2022-11-09 DIAGNOSIS — S329XXA Fracture of unspecified parts of lumbosacral spine and pelvis, initial encounter for closed fracture: Secondary | ICD-10-CM | POA: Diagnosis present

## 2022-11-09 DIAGNOSIS — Z8249 Family history of ischemic heart disease and other diseases of the circulatory system: Secondary | ICD-10-CM

## 2022-11-09 DIAGNOSIS — Z1152 Encounter for screening for COVID-19: Secondary | ICD-10-CM | POA: Diagnosis not present

## 2022-11-09 DIAGNOSIS — H409 Unspecified glaucoma: Secondary | ICD-10-CM | POA: Diagnosis present

## 2022-11-09 DIAGNOSIS — G894 Chronic pain syndrome: Secondary | ICD-10-CM | POA: Diagnosis present

## 2022-11-09 DIAGNOSIS — Z23 Encounter for immunization: Secondary | ICD-10-CM

## 2022-11-09 DIAGNOSIS — J45909 Unspecified asthma, uncomplicated: Secondary | ICD-10-CM | POA: Diagnosis present

## 2022-11-09 DIAGNOSIS — Z825 Family history of asthma and other chronic lower respiratory diseases: Secondary | ICD-10-CM

## 2022-11-09 DIAGNOSIS — E039 Hypothyroidism, unspecified: Secondary | ICD-10-CM

## 2022-11-09 DIAGNOSIS — S32020A Wedge compression fracture of second lumbar vertebra, initial encounter for closed fracture: Secondary | ICD-10-CM

## 2022-11-09 DIAGNOSIS — I10 Essential (primary) hypertension: Secondary | ICD-10-CM

## 2022-11-09 DIAGNOSIS — W010XXA Fall on same level from slipping, tripping and stumbling without subsequent striking against object, initial encounter: Secondary | ICD-10-CM | POA: Diagnosis present

## 2022-11-09 DIAGNOSIS — W19XXXA Unspecified fall, initial encounter: Secondary | ICD-10-CM

## 2022-11-09 DIAGNOSIS — H9190 Unspecified hearing loss, unspecified ear: Secondary | ICD-10-CM | POA: Diagnosis present

## 2022-11-09 DIAGNOSIS — Z79899 Other long term (current) drug therapy: Secondary | ICD-10-CM

## 2022-11-09 DIAGNOSIS — S3210XA Unspecified fracture of sacrum, initial encounter for closed fracture: Secondary | ICD-10-CM

## 2022-11-09 DIAGNOSIS — H353290 Exudative age-related macular degeneration, unspecified eye, stage unspecified: Secondary | ICD-10-CM | POA: Diagnosis present

## 2022-11-09 DIAGNOSIS — E876 Hypokalemia: Secondary | ICD-10-CM

## 2022-11-09 DIAGNOSIS — J4520 Mild intermittent asthma, uncomplicated: Secondary | ICD-10-CM

## 2022-11-09 LAB — CBC WITH DIFFERENTIAL/PLATELET
Abs Immature Granulocytes: 0.11 10*3/uL — ABNORMAL HIGH (ref 0.00–0.07)
Basophils Absolute: 0 10*3/uL (ref 0.0–0.1)
Basophils Relative: 0 %
Eosinophils Absolute: 0.3 10*3/uL (ref 0.0–0.5)
Eosinophils Relative: 3 %
HCT: 39.1 % (ref 36.0–46.0)
Hemoglobin: 12.2 g/dL (ref 12.0–15.0)
Immature Granulocytes: 1 %
Lymphocytes Relative: 14 %
Lymphs Abs: 1.7 10*3/uL (ref 0.7–4.0)
MCH: 31 pg (ref 26.0–34.0)
MCHC: 31.2 g/dL (ref 30.0–36.0)
MCV: 99.5 fL (ref 80.0–100.0)
Monocytes Absolute: 0.9 10*3/uL (ref 0.1–1.0)
Monocytes Relative: 7 %
Neutro Abs: 9.3 10*3/uL — ABNORMAL HIGH (ref 1.7–7.7)
Neutrophils Relative %: 75 %
Platelets: 215 10*3/uL (ref 150–400)
RBC: 3.93 MIL/uL (ref 3.87–5.11)
RDW: 13.7 % (ref 11.5–15.5)
WBC: 12.3 10*3/uL — ABNORMAL HIGH (ref 4.0–10.5)
nRBC: 0 % (ref 0.0–0.2)

## 2022-11-09 LAB — BASIC METABOLIC PANEL
Anion gap: 10 (ref 5–15)
BUN: 16 mg/dL (ref 8–23)
CO2: 26 mmol/L (ref 22–32)
Calcium: 9.1 mg/dL (ref 8.9–10.3)
Chloride: 100 mmol/L (ref 98–111)
Creatinine, Ser: 0.67 mg/dL (ref 0.44–1.00)
GFR, Estimated: >60 mL/min (ref >60)
Glucose, Bld: 103 mg/dL — ABNORMAL HIGH (ref 70–99)
Potassium: 3.9 mmol/L (ref 3.5–5.1)
Sodium: 136 mmol/L (ref 135–145)

## 2022-11-09 LAB — RESP PANEL BY RT-PCR (RSV, FLU A&B, COVID)  RVPGX2
Influenza A by PCR: NEGATIVE
Influenza B by PCR: NEGATIVE
Resp Syncytial Virus by PCR: NEGATIVE
SARS Coronavirus 2 by RT PCR: NEGATIVE

## 2022-11-09 MED ORDER — OXYCODONE-ACETAMINOPHEN 5-325 MG PO TABS
1.0000 | ORAL_TABLET | Freq: Once | ORAL | Status: AC
  Administered 2022-11-09: 1 via ORAL
  Filled 2022-11-09: qty 1

## 2022-11-09 MED ORDER — SODIUM CHLORIDE 0.9 % IV SOLN
500.0000 mg | Freq: Once | INTRAVENOUS | Status: AC
  Administered 2022-11-09: 500 mg via INTRAVENOUS
  Filled 2022-11-09: qty 5

## 2022-11-09 MED ORDER — SODIUM CHLORIDE 0.9 % IV SOLN
1.0000 g | Freq: Once | INTRAVENOUS | Status: AC
  Administered 2022-11-09: 1 g via INTRAVENOUS
  Filled 2022-11-09: qty 10

## 2022-11-09 MED ORDER — IOHEXOL 300 MG/ML  SOLN
75.0000 mL | Freq: Once | INTRAMUSCULAR | Status: AC | PRN
  Administered 2022-11-09: 75 mL via INTRAVENOUS

## 2022-11-09 MED ORDER — KETOROLAC TROMETHAMINE 60 MG/2ML IM SOLN
30.0000 mg | Freq: Once | INTRAMUSCULAR | Status: AC
  Administered 2022-11-09: 30 mg via INTRAMUSCULAR
  Filled 2022-11-09: qty 2

## 2022-11-09 MED ORDER — LIDOCAINE 5 % EX PTCH
1.0000 | MEDICATED_PATCH | CUTANEOUS | Status: DC
  Administered 2022-11-09 – 2022-11-12 (×4): 1 via TRANSDERMAL
  Filled 2022-11-09 (×3): qty 1

## 2022-11-09 NOTE — ED Notes (Signed)
Son updated as to patient's status. ?

## 2022-11-09 NOTE — ED Notes (Signed)
Heather in the [facility] advised to use a cool compress to the area of infiltration and continue to monitor.

## 2022-11-09 NOTE — ED Notes (Signed)
Pt placed on Purewick Pt has difficulty turning due to L2 fx, and multiple sacral fx's  Pt has also not been fitted for TLSO brace

## 2022-11-09 NOTE — ED Provider Triage Note (Signed)
Emergency Medicine Provider Triage Evaluation Note  CARMELINE KOWAL , a 77 y.o. female  was evaluated in triage.  Pt complains of right posterior hip and buttock pain after tripping and falling 2 nights ago.  She has severe pain which she states she just could not stand any longer.  She has been unable to weight-bear on the right side since she fell.  She reports hitting her head, denies LOC, denies any other complaint of pain from this fall.  Review of Systems  Positive: Hip pain, inability to weight-bear Negative: Nausea, vomiting, headache new worsening focal weakness  Physical Exam  BP (!) 198/105 (BP Location: Right Arm)   Pulse (!) 105   Temp 98.5 F (36.9 C) (Oral)   Resp 17   Ht 5' (1.524 m)   Wt 56.2 kg   SpO2 90%   BMI 24.22 kg/m  Gen:   Awake, no distress   Resp:  Normal effort  MSK:   Unable to move right leg at hip secondary to severe pain. Other:    Medical Decision Making  Medically screening exam initiated at 12:49 PM.  Appropriate orders placed.  Migdalia SHONTAVIA MICKEL was informed that the remainder of the evaluation will be completed by another provider, this initial triage assessment does not replace that evaluation, and the importance of remaining in the ED until their evaluation is complete.     Burgess Amor, PA-C 11/09/22 1251

## 2022-11-09 NOTE — ED Provider Notes (Signed)
[place] [facility] AT [facility] Provider Note   CSN: [id number] Arrival date & time: 11/09/22  1221     History  Chief Complaint  Patient presents with   Hip Pain    Heidi Fuentes is a 77 y.o. female.   Hip Pain  Patient presents for fall.  Medical history includes asthma, anemia, GERD, HTN, depression, bipolar disorder.  2 days ago, she had a mechanical fall.  At the time, she was getting into her bed and lost her balance.  She states that she landed on her bottom and did strike her head.  She states that she had a swelling to her scalp which has since resolved.  She has had difficulty with bearing weight on her legs.  She describes areas of pain as posterior bilateral hips in addition to lower back.  She has chronic right knee pain which has not worsened.     Home Medications Prior to Admission medications   Medication Sig Start Date End Date Taking? Authorizing Provider  acetaminophen (TYLENOL) 500 MG tablet Take 500 mg by mouth every 6 (six) hours as needed for mild pain or moderate pain. For pain    [provider]  albuterol (PROVENTIL) (2.5 MG/3ML) 0.083% nebulizer solution Take 3 mLs (2.5 mg total) by nebulization every 6 (six) hours as needed for wheezing or shortness of breath. 02/04/22   Nyoka Cowden, MD  albuterol (VENTOLIN HFA) 108 (90 Base) MCG/ACT inhaler Inhale 1 puff into the lungs every 4 (four) hours as needed for wheezing or shortness of breath. For shortness of breath    [provider]  beclomethasone (QVAR) 80 MCG/ACT inhaler Inhale 1 puff into the lungs daily. 06/03/22   Oretha Milch, MD  benztropine (COGENTIN) 1 MG tablet Take 1 tablet (1 mg total) by mouth 2 (two) times daily. 07/28/22   Vassie Loll, MD  Budeson-Glycopyrrol-Formoterol (BREZTRI AEROSPHERE) 160-9-4.8 MCG/ACT AERO Inhale 2 puffs into the lungs in the morning and at bedtime. Patient taking differently: Inhale 1 puff into the lungs in the morning and  at bedtime. 03/18/20   Oretha Milch, MD  buPROPion (WELLBUTRIN) 75 MG tablet Take 1 tablet (75 mg total) by mouth 2 (two) times daily. 07/28/22   Vassie Loll, MD  cetirizine (ZYRTEC) 5 MG tablet Take 5 mg by mouth at bedtime.    [provider]  chlorhexidine (PERIDEX) 0.12 % solution Use as directed 15 mLs in the mouth or throat 2 (two) times daily. 07/09/21   [provider]  cholecalciferol (VITAMIN D3) 25 MCG (1000 UNIT) tablet Take 1,000 Units by mouth daily.    [provider]  divalproex (DEPAKOTE SPRINKLE) 125 MG capsule Take 1 capsule (125 mg total) by mouth every 12 (twelve) hours. May be swallowed whole or sprinkle capsule contents on soft food (such as applesauce); swallow the drug/food mixture immediately (avoid chewing) and do not store for future use. 07/28/22   Vassie Loll, MD  famotidine (PEPCID) 20 MG tablet One after supper 12/23/21   Nyoka Cowden, MD  feeding supplement (ENSURE ENLIVE / ENSURE PLUS) LIQD Take 237 mLs by mouth 2 (two) times daily between meals. 07/28/22   Vassie Loll, MD  fluticasone [facility]) 50 MCG/ACT nasal spray Place 2 sprays into both nostrils daily.    [provider]  gabapentin (NEURONTIN) 300 MG capsule Take 300 mg by mouth at bedtime.    [provider]  ketoconazole (NIZORAL) 2 % cream Apply 1 Application  topically daily as needed for irritation.    [provider]  lamoTRIgine (LAMICTAL) 200 MG tablet Take 200 mg by mouth daily. 06/05/19   [provider]  latanoprost (XALATAN) 0.005 % ophthalmic solution Place 1 drop into both eyes daily as needed. 04/20/13   [provider]  levothyroxine (SYNTHROID, LEVOTHROID) 50 MCG tablet Take 50 mcg by mouth daily.    [provider]  losartan (COZAAR) 25 MG tablet Take 1 tablet (25 mg total) by mouth daily. 08/14/20   Danford, Earl Lites, MD  melatonin 5 MG TABS Take 5 mg by mouth at bedtime. 11/17/17   [provider]  metoprolol tartrate (LOPRESSOR) 25 MG tablet Take 12.5 mg by mouth 2 (two) times daily. 01/18/22   [provider]  montelukast (SINGULAIR) 10 MG tablet Take 10 mg by mouth daily. 05/03/13   [provider]  Multiple Vitamins-Minerals (PRESERVISION/LUTEIN PO) Take 1 tablet by mouth 2 (two) times daily.     [provider]  pantoprazole (PROTONIX) 40 MG tablet Take 1 tablet (40 mg total) by mouth daily. Take 30-60 min before first meal of the day 12/23/21   Nyoka Cowden, MD      Allergies    Abilify [aripiprazole], Geodon [ziprasidone hcl], Quetiapine, Sulfa antibiotics, and Trazodone and nefazodone    Review of Systems   Review of Systems  Musculoskeletal:  Positive for arthralgias, back pain and gait problem.  All other systems reviewed and are negative.   Physical Exam Updated Vital Signs BP (!) 147/64   Pulse 90   Temp 98.4 F (36.9 C) (Oral)   Resp 12   Ht 5' (1.524 m)   Wt 56.2 kg   SpO2 90%   BMI 24.22 kg/m  Physical Exam Vitals and nursing note reviewed.  Constitutional:      General: She is not in acute distress.    Appearance: Normal appearance. She is well-developed. She is not ill-appearing, toxic-appearing or diaphoretic.  HENT:     Head: Normocephalic and atraumatic.     Right Ear: External ear normal.     Left Ear: External ear normal.     Nose: Nose normal.     Mouth/Throat:     Mouth: Mucous membranes are moist.  Eyes:     Extraocular Movements: Extraocular movements intact.     Conjunctiva/sclera: Conjunctivae normal.  Cardiovascular:     Rate and Rhythm: Normal rate and regular rhythm.  Pulmonary:     Effort: Pulmonary effort is normal. No respiratory distress.  Chest:     Chest wall: No tenderness.  Abdominal:     General: There is no distension.     Palpations: Abdomen is soft.     Tenderness: There is no abdominal tenderness.  Musculoskeletal:        General: No swelling. Normal range of motion.     Cervical  back: Neck supple.     Right lower leg: No edema.     Left lower leg: No edema.  Skin:    General: Skin is warm and dry.     Coloration: Skin is not jaundiced or pale.  Neurological:     General: No focal deficit present.     Mental Status: She is alert and oriented to person, place, and time.  Psychiatric:        Mood and Affect: Mood normal.        Behavior: Behavior normal.     ED Results / Procedures / Treatments  Labs (all labs ordered are listed, but only abnormal results are displayed) Labs Reviewed  CBC WITH DIFFERENTIAL/PLATELET - Abnormal; Notable for the following components:      Result Value   WBC 12.3 (*)    Neutro Abs 9.3 (*)    Abs Immature Granulocytes 0.11 (*)    All other components within normal limits  BASIC METABOLIC PANEL - Abnormal; Notable for the following components:   Glucose, Bld 103 (*)    All other components within normal limits  RESP PANEL BY RT-PCR (RSV, FLU A&B, COVID)  [id number]  URINALYSIS, ROUTINE W REFLEX MICROSCOPIC    EKG None  Radiology CT L-SPINE NO CHARGE  Result Date: 11/09/2022 CLINICAL DATA:  Trip and fall 2 nights ago, severe right hip pain EXAM: CT CHEST, ABDOMEN, AND PELVIS WITH CONTRAST CT THORACIC AND LUMBAR SPINE WITH CONTRAST TECHNIQUE: Multidetector CT imaging of the chest, abdomen and pelvis was performed following the standard protocol during bolus administration of intravenous contrast. Multidetector CT imaging of the thoracic and lumbar spine was performed following the standard protocol during bolus administration of intravenous contrast. RADIATION DOSE REDUCTION: This exam was performed according to the departmental dose-optimization program which includes automated exposure control, adjustment of the mA and/or kV according to patient size and/or use of iterative reconstruction technique. CONTRAST:  75mL OMNIPAQUE IOHEXOL 300 MG/ML  SOLN COMPARISON:  CT chest, 07/27/2019 CT abdomen pelvis, 07/09/2013 FINDINGS: CT CHEST  FINDINGS Cardiovascular: Aortic atherosclerosis. Normal heart size. Left coronary artery calcifications. No pericardial effusion. Mediastinum/Nodes: No enlarged mediastinal, hilar, or axillary lymph nodes. Thyroid gland, trachea, and esophagus demonstrate no significant findings. Lungs/Pleura: Multifocal heterogeneous airspace opacity and consolidation throughout the lungs, most conspicuously in the right middle lobe (series 9, image 95) and superior segment right lower lobe (series 9, image 83). No pleural effusion or pneumothorax. Musculoskeletal: No chest wall mass or suspicious osseous lesions identified. CT ABDOMEN PELVIS FINDINGS Hepatobiliary: No solid liver abnormality is seen. No gallstones, gallbladder wall thickening, or biliary dilatation. Pancreas: Fatty atrophy of the pancreatic parenchyma. No pancreatic ductal dilatation or surrounding inflammatory changes. Spleen: Normal in size without significant abnormality. Adrenals/Urinary Tract: Adrenal glands are unremarkable. Kidneys are normal, without renal calculi, solid lesion, or hydronephrosis. Bladder is unremarkable. Stomach/Bowel: Evidence of prior hiatal hernia repair. Stomach is otherwise within normal limits. Appendix not clearly visualized and may be surgically absent. No evidence of bowel wall thickening, distention, or inflammatory changes. Vascular/Lymphatic: Aortic atherosclerosis. No enlarged abdominal or pelvic lymph nodes. Reproductive: No mass or other abnormality. Other: Small fat containing umbilical hernia.  No ascites. Musculoskeletal: No acute osseous findings. CT THORACIC AND LUMBAR SPINE FINDINGS Alignment: Normal thoracic kyphosis. Minimal degenerative anterolisthesis of L4 on L5, with otherwise normal lumbar lordosis. Vertebral bodies: Mild, acute appearing superior endplate deformity of L2 without significant anterior height loss (series 5, image 33). Mild superior endplate deformity and buckled anterior cortical fracture of the  S1 and S2 sacral segments (series 5, image 34). Minimal, nonacute wedging of the T11 vertebral body. Disc spaces: Intact. Paraspinous soft tissues: Unremarkable. IMPRESSION: 1. Mild, acute appearing superior endplate deformity of L2 without significant anterior height loss. 2. Mild superior endplate deformity and buckled anterior cortical fracture of the S1 and S2 sacral segments. 3. No evidence of acute traumatic injury to the organs of the chest, abdomen, or pelvis. 4. No evidence of displaced right hip fracture per report of pain and inability to bear weight. 5. Multifocal heterogeneous airspace opacity and consolidation throughout the lungs, most conspicuously  in the right middle lobe and superior segment right lower lobe. Findings are most consistent with multifocal infection or aspiration. 6. Coronary artery disease. Aortic Atherosclerosis (ICD10-I70.0). Electronically Signed   By: Jearld Lesch M.D.   On: 11/09/2022 19:50   CT CHEST ABDOMEN PELVIS W CONTRAST  Result Date: 11/09/2022 CLINICAL DATA:  Trip and fall 2 nights ago, severe right hip pain EXAM: CT CHEST, ABDOMEN, AND PELVIS WITH CONTRAST CT THORACIC AND LUMBAR SPINE WITH CONTRAST TECHNIQUE: Multidetector CT imaging of the chest, abdomen and pelvis was performed following the standard protocol during bolus administration of intravenous contrast. Multidetector CT imaging of the thoracic and lumbar spine was performed following the standard protocol during bolus administration of intravenous contrast. RADIATION DOSE REDUCTION: This exam was performed according to the departmental dose-optimization program which includes automated exposure control, adjustment of the mA and/or kV according to patient size and/or use of iterative reconstruction technique. CONTRAST:  75mL OMNIPAQUE IOHEXOL 300 MG/ML  SOLN COMPARISON:  CT chest, 07/27/2019 CT abdomen pelvis, 07/09/2013 FINDINGS: CT CHEST FINDINGS Cardiovascular: Aortic atherosclerosis. Normal heart size.  Left coronary artery calcifications. No pericardial effusion. Mediastinum/Nodes: No enlarged mediastinal, hilar, or axillary lymph nodes. Thyroid gland, trachea, and esophagus demonstrate no significant findings. Lungs/Pleura: Multifocal heterogeneous airspace opacity and consolidation throughout the lungs, most conspicuously in the right middle lobe (series 9, image 95) and superior segment right lower lobe (series 9, image 83). No pleural effusion or pneumothorax. Musculoskeletal: No chest wall mass or suspicious osseous lesions identified. CT ABDOMEN PELVIS FINDINGS Hepatobiliary: No solid liver abnormality is seen. No gallstones, gallbladder wall thickening, or biliary dilatation. Pancreas: Fatty atrophy of the pancreatic parenchyma. No pancreatic ductal dilatation or surrounding inflammatory changes. Spleen: Normal in size without significant abnormality. Adrenals/Urinary Tract: Adrenal glands are unremarkable. Kidneys are normal, without renal calculi, solid lesion, or hydronephrosis. Bladder is unremarkable. Stomach/Bowel: Evidence of prior hiatal hernia repair. Stomach is otherwise within normal limits. Appendix not clearly visualized and may be surgically absent. No evidence of bowel wall thickening, distention, or inflammatory changes. Vascular/Lymphatic: Aortic atherosclerosis. No enlarged abdominal or pelvic lymph nodes. Reproductive: No mass or other abnormality. Other: Small fat containing umbilical hernia.  No ascites. Musculoskeletal: No acute osseous findings. CT THORACIC AND LUMBAR SPINE FINDINGS Alignment: Normal thoracic kyphosis. Minimal degenerative anterolisthesis of L4 on L5, with otherwise normal lumbar lordosis. Vertebral bodies: Mild, acute appearing superior endplate deformity of L2 without significant anterior height loss (series 5, image 33). Mild superior endplate deformity and buckled anterior cortical fracture of the S1 and S2 sacral segments (series 5, image 34). Minimal, nonacute  wedging of the T11 vertebral body. Disc spaces: Intact. Paraspinous soft tissues: Unremarkable. IMPRESSION: 1. Mild, acute appearing superior endplate deformity of L2 without significant anterior height loss. 2. Mild superior endplate deformity and buckled anterior cortical fracture of the S1 and S2 sacral segments. 3. No evidence of acute traumatic injury to the organs of the chest, abdomen, or pelvis. 4. No evidence of displaced right hip fracture per report of pain and inability to bear weight. 5. Multifocal heterogeneous airspace opacity and consolidation throughout the lungs, most conspicuously in the right middle lobe and superior segment right lower lobe. Findings are most consistent with multifocal infection or aspiration. 6. Coronary artery disease. Aortic Atherosclerosis (ICD10-I70.0). Electronically Signed   By: Jearld Lesch M.D.   On: 11/09/2022 19:50   CT T-SPINE NO CHARGE  Result Date: 11/09/2022 CLINICAL DATA:  Trip and fall 2 nights ago, severe right hip pain EXAM: CT  CHEST, ABDOMEN, AND PELVIS WITH CONTRAST CT THORACIC AND LUMBAR SPINE WITH CONTRAST TECHNIQUE: Multidetector CT imaging of the chest, abdomen and pelvis was performed following the standard protocol during bolus administration of intravenous contrast. Multidetector CT imaging of the thoracic and lumbar spine was performed following the standard protocol during bolus administration of intravenous contrast. RADIATION DOSE REDUCTION: This exam was performed according to the departmental dose-optimization program which includes automated exposure control, adjustment of the mA and/or kV according to patient size and/or use of iterative reconstruction technique. CONTRAST:  75mL OMNIPAQUE IOHEXOL 300 MG/ML  SOLN COMPARISON:  CT chest, 07/27/2019 CT abdomen pelvis, 07/09/2013 FINDINGS: CT CHEST FINDINGS Cardiovascular: Aortic atherosclerosis. Normal heart size. Left coronary artery calcifications. No pericardial effusion.  Mediastinum/Nodes: No enlarged mediastinal, hilar, or axillary lymph nodes. Thyroid gland, trachea, and esophagus demonstrate no significant findings. Lungs/Pleura: Multifocal heterogeneous airspace opacity and consolidation throughout the lungs, most conspicuously in the right middle lobe (series 9, image 95) and superior segment right lower lobe (series 9, image 83). No pleural effusion or pneumothorax. Musculoskeletal: No chest wall mass or suspicious osseous lesions identified. CT ABDOMEN PELVIS FINDINGS Hepatobiliary: No solid liver abnormality is seen. No gallstones, gallbladder wall thickening, or biliary dilatation. Pancreas: Fatty atrophy of the pancreatic parenchyma. No pancreatic ductal dilatation or surrounding inflammatory changes. Spleen: Normal in size without significant abnormality. Adrenals/Urinary Tract: Adrenal glands are unremarkable. Kidneys are normal, without renal calculi, solid lesion, or hydronephrosis. Bladder is unremarkable. Stomach/Bowel: Evidence of prior hiatal hernia repair. Stomach is otherwise within normal limits. Appendix not clearly visualized and may be surgically absent. No evidence of bowel wall thickening, distention, or inflammatory changes. Vascular/Lymphatic: Aortic atherosclerosis. No enlarged abdominal or pelvic lymph nodes. Reproductive: No mass or other abnormality. Other: Small fat containing umbilical hernia.  No ascites. Musculoskeletal: No acute osseous findings. CT THORACIC AND LUMBAR SPINE FINDINGS Alignment: Normal thoracic kyphosis. Minimal degenerative anterolisthesis of L4 on L5, with otherwise normal lumbar lordosis. Vertebral bodies: Mild, acute appearing superior endplate deformity of L2 without significant anterior height loss (series 5, image 33). Mild superior endplate deformity and buckled anterior cortical fracture of the S1 and S2 sacral segments (series 5, image 34). Minimal, nonacute wedging of the T11 vertebral body. Disc spaces: Intact.  Paraspinous soft tissues: Unremarkable. IMPRESSION: 1. Mild, acute appearing superior endplate deformity of L2 without significant anterior height loss. 2. Mild superior endplate deformity and buckled anterior cortical fracture of the S1 and S2 sacral segments. 3. No evidence of acute traumatic injury to the organs of the chest, abdomen, or pelvis. 4. No evidence of displaced right hip fracture per report of pain and inability to bear weight. 5. Multifocal heterogeneous airspace opacity and consolidation throughout the lungs, most conspicuously in the right middle lobe and superior segment right lower lobe. Findings are most consistent with multifocal infection or aspiration. 6. Coronary artery disease. Aortic Atherosclerosis (ICD10-I70.0). Electronically Signed   By: Jearld Lesch M.D.   On: 11/09/2022 19:50   CT Head Wo Contrast  Result Date: 11/09/2022 CLINICAL DATA:  Trip and fall 2 nights ago pain EXAM: CT HEAD WITHOUT CONTRAST CT CERVICAL SPINE WITHOUT CONTRAST TECHNIQUE: Multidetector CT imaging of the head and cervical spine was performed following the standard protocol without intravenous contrast. Multiplanar CT image reconstructions of the cervical spine were also generated. RADIATION DOSE REDUCTION: This exam was performed according to the departmental dose-optimization program which includes automated exposure control, adjustment of the mA and/or kV according to patient size and/or use of iterative reconstruction  technique. COMPARISON:  07/15/2022 FINDINGS: CT HEAD FINDINGS Brain: No evidence of acute infarction, hemorrhage, hydrocephalus, extra-axial collection or mass lesion/mass effect. Periventricular white matter hypodensity. Vascular: No hyperdense vessel or unexpected calcification. Skull: Normal. Negative for fracture or focal lesion. Sinuses/Orbits: No acute finding. Other: None. CT CERVICAL SPINE FINDINGS Alignment: Normal. Skull base and vertebrae: No acute fracture. No primary bone  lesion or focal pathologic process. Soft tissues and spinal canal: No prevertebral fluid or swelling. No visible canal hematoma. Disc levels: Focally moderate disc space height loss and osteophytosis of C5-C6 with otherwise mild disc degenerative change. Upper chest: Negative. Other: None. IMPRESSION: 1. No acute intracranial pathology. 2. No fracture or static subluxation of the cervical spine. 3. Focally moderate disc space height loss and osteophytosis of C5-C6 with otherwise mild disc degenerative change. Electronically Signed   By: Jearld Lesch M.D.   On: 11/09/2022 19:25   CT Cervical Spine Wo Contrast  Result Date: 11/09/2022 CLINICAL DATA:  Trip and fall 2 nights ago pain EXAM: CT HEAD WITHOUT CONTRAST CT CERVICAL SPINE WITHOUT CONTRAST TECHNIQUE: Multidetector CT imaging of the head and cervical spine was performed following the standard protocol without intravenous contrast. Multiplanar CT image reconstructions of the cervical spine were also generated. RADIATION DOSE REDUCTION: This exam was performed according to the departmental dose-optimization program which includes automated exposure control, adjustment of the mA and/or kV according to patient size and/or use of iterative reconstruction technique. COMPARISON:  07/15/2022 FINDINGS: CT HEAD FINDINGS Brain: No evidence of acute infarction, hemorrhage, hydrocephalus, extra-axial collection or mass lesion/mass effect. Periventricular white matter hypodensity. Vascular: No hyperdense vessel or unexpected calcification. Skull: Normal. Negative for fracture or focal lesion. Sinuses/Orbits: No acute finding. Other: None. CT CERVICAL SPINE FINDINGS Alignment: Normal. Skull base and vertebrae: No acute fracture. No primary bone lesion or focal pathologic process. Soft tissues and spinal canal: No prevertebral fluid or swelling. No visible canal hematoma. Disc levels: Focally moderate disc space height loss and osteophytosis of C5-C6 with otherwise mild  disc degenerative change. Upper chest: Negative. Other: None. IMPRESSION: 1. No acute intracranial pathology. 2. No fracture or static subluxation of the cervical spine. 3. Focally moderate disc space height loss and osteophytosis of C5-C6 with otherwise mild disc degenerative change. Electronically Signed   By: Jearld Lesch M.D.   On: 11/09/2022 19:25   DG Hip Unilat W or Wo Pelvis 2-3 Views Right  Result Date: 11/09/2022 CLINICAL DATA:  Right hip pain after falling. EXAM: DG HIP (WITH OR WITHOUT PELVIS) 2-3V RIGHT COMPARISON:  Pelvic radiographs 08/03/2020. Left hip MRI 12/15/2019. FINDINGS: The mineralization and alignment are normal. There is no evidence of acute fracture or dislocation. No evidence of femoral head osteonecrosis. Moderately advanced asymmetric left hip osteoarthritis again noted. There is minimal right hip joint space narrowing. The soft tissues appear unremarkable. IMPRESSION: No evidence of acute fracture or dislocation. Moderately advanced left hip osteoarthritis. Electronically Signed   By: Carey Bullocks M.D.   On: 11/09/2022 14:59    Procedures Procedures    Medications Ordered in ED Medications  lidocaine (LIDODERM) 5 % 1 patch (1 patch Transdermal Patch Applied 11/09/22 1609)  ketorolac (TORADOL) injection 30 mg (30 mg Intramuscular Given 11/09/22 1610)  oxyCODONE-acetaminophen (PERCOCET/ROXICET) 5-325 MG per tablet 1 tablet (1 tablet Oral Given 11/09/22 1610)  iohexol (OMNIPAQUE) 300 MG/ML solution 75 mL (75 mLs Intravenous Contrast Given 11/09/22 1910)  oxyCODONE-acetaminophen (PERCOCET/ROXICET) 5-325 MG per tablet 1 tablet (1 tablet Oral Given 11/09/22 2015)  cefTRIAXone (ROCEPHIN) 1  g in sodium chloride 0.9 % 100 mL IVPB (0 g Intravenous Stopped 11/09/22 2057)  azithromycin (ZITHROMAX) 500 mg in sodium chloride 0.9 % 250 mL IVPB (0 mg Intravenous Stopped 11/09/22 2304)    ED Course/ Medical Decision Making/ A&P                                 Medical Decision  Making Amount and/or Complexity of Data Reviewed Labs: ordered. Radiology: ordered.  Risk Prescription drug management. Decision regarding hospitalization.   This patient presents to the ED for concern of fall, this involves an extensive number of treatment options, and is a complaint that carries with it a high risk of complications and morbidity.  The differential diagnosis includes acute injuries, underlying conditions leading to generalized weakness   Co morbidities that complicate the patient evaluation  asthma, anemia, GERD, HTN, depression, bipolar disorder   Additional history obtained:  Additional history obtained from patient's caregiver External records from outside source obtained and reviewed including EMR   Lab Tests:  I Ordered, and personally interpreted labs.  The pertinent results include: A leukocytosis is present.  Serum lab work is otherwise unremarkable.   Imaging Studies ordered:  I ordered imaging studies including CT of head, cervical spine, chest, abdomen, pelvis, T-spine, L-spine I independently visualized and interpreted imaging which showed mild L2, S1, S2 superior endplate deformities; multifocal pneumonia I agree with the radiologist interpretation   Cardiac Monitoring: / EKG:  The patient was maintained on a cardiac monitor.  I personally viewed and interpreted the cardiac monitored which showed an underlying rhythm of: Sinus rhythm  Problem List / ED Course / Critical interventions / Medication management  Patient presents for posterior bilateral hip pain following a fall 2 days ago.  Prior to being bedded in the ED, x-ray imaging of pelvis and right hip was obtained.  This did not show any acute injuries.  No deformities are noted on exam.  She does have tenderness to posterior hips and lower back.  Will obtain CT imaging.  Multimodal pain control was ordered.  Lab work is notable for leukocytosis with neutrophilia.  Imaging studies are  notable for superior endplate deformities of L2, S1, and S2.  On reassessment, patient endorses ongoing pain.  TLSO brace was ordered for her L2 fracture.  Additional Percocet was ordered.  Currently, SpO2 is 90% on room air.  She is not on oxygen at baseline.  Given her ongoing pain from her injuries, she is unable to undergo trial of ambulation.  Currently, she lives alone and does have a caregiver support.  She has had poor mobility and actually only discharged from rehab facility a week ago.  I feel that she will do poorly at home.  Given the findings of pneumonia and low oxygen saturations, patient to be admitted. I ordered medication including lidocaine patch, Percocet, Toradol for analgesia; ceftriaxone and azithromycin for multifocal pneumonia Reevaluation of the patient after these medicines showed that the patient improved I have reviewed the patients home medicines and have made adjustments as needed   Social Determinants of Health:  Lives at home alone with caregiver support; recent discharge from rehab facility         Final Clinical Impression(s) / ED Diagnoses Final diagnoses:  Multifocal pneumonia  Fall, initial encounter  Closed compression fracture of L2 lumbar vertebra, initial encounter (HCC)  Closed fracture of sacrum, unspecified portion of sacrum, initial encounter (  [facility])    Rx / DC Orders ED Discharge Orders     None         Gloris Manchester, MD 11/09/22 2320

## 2022-11-09 NOTE — ED Notes (Signed)
Pt placed on bpan x 1 assist Pt urinated

## 2022-11-09 NOTE — ED Triage Notes (Signed)
Pt BIB [facility] for fall and right hip pain. Pt missed stool and fell, right hip pain.  Pt states she hit her head on the floor. Denies LOC.  Denies any blood thinners. Pt states she is not able to walk on right hip. EMS reports vitals 170/80, HR 77 and RA sats 97%

## 2022-11-09 NOTE — H&P (Incomplete)
History and Physical    Patient: Heidi Fuentes WUJ:[id number] DOB: [birth year] DOA: 11/09/2022 DOS: the patient was seen and examined on 11/10/2022 PCP: Tanna Furry, MD  Patient coming from: Home  Chief Complaint:  Chief Complaint  Patient presents with   Hip Pain   HPI: Heidi Fuentes is a 77 y.o. female with medical history significant of hypertension, asthma, bipolar disorder who presents to the emergency department due to sustaining a fall about 2 days ago whereby she lost her balance while getting into a bed and she landed on her bottom and accidentally hit her head in the process.  She developed swelling of her scalp which has since resolved, but now complaining of right hip pain and right lower back with difficulty of bearing weight on her legs.  ED Course:  In the emergency department,, she was tachycardic on arrival to the ED, BP was 150/69, other vital signs were within normal range.  Workup in the ED showed normal CBC except for WBC of 12.3, BMP was normal except for blood glucose of 103.  Influenza A, B, SARS, respiratory, RSV was negative. CT thoracic and lumbar spine showed 1. Mild, acute appearing superior endplate deformity of L2 without significant anterior height loss. 2. Mild superior endplate deformity and buckled anterior cortical fracture of the S1 and S2 sacral segments. 3. No evidence of acute traumatic injury to the organs of the chest, abdomen, or pelvis. 4. No evidence of displaced right hip fracture per report of pain and inability to bear weight. 5. Multifocal heterogeneous airspace opacity and consolidation throughout the lungs, most conspicuously in the right middle lobe and superior segment right lower lobe. Findings are most consistent with multifocal infection or aspiration. CT head without contrast showed no acute retinal pathology Right hip x-ray showed no evidence of acute fracture or dislocation. Patient was empirically started on IV ceftriaxone  and azithromycin, IV Toradol was given, Percocet was also given due to pain.  TLSO brace was ordered. Hospitalist was asked to admit patient for further evaluation and management.  Review of Systems: Review of systems as noted in the HPI. All other systems reviewed and are negative.   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[facility])    Past Surgical History:  Procedure Laterality Date   CATARACT EXTRACTION W/PHACO Right 10/06/2016   Procedure: CATARACT EXTRACTION PHACO AND INTRAOCULAR LENS PLACEMENT (IOC);  Surgeon: Galen Manila, MD;  Location: [facility] ORS;  Service: Ophthalmology;  Laterality: Right;  [place] 00:32.5 AP% 14.5 CDE 4.71 Fluid pack lot # [id number]   CATARACT EXTRACTION W/PHACO Left 11/03/2016   Procedure: CATARACT EXTRACTION PHACO AND INTRAOCULAR LENS PLACEMENT (IOC);  Surgeon: Galen Manila, MD;  Location: [facility] ORS;  Service: Ophthalmology;  Laterality: Left;  [place] 00:36.6 AP% 16.0 CDE 5.86  Fluid Pack lot # [id number]   COCCYX REMOVAL     colonoscopy  2005   Dr. Karilyn Cota: mild melanosis coli, otherwise normal   COLONOSCOPY WITH PROPOFOL N/A 09/14/2013   [id number] coli. Colonic diverticulosis. Single colonic. Tubular adenoma. Next TCS 09/2020.   COLONOSCOPY WITH PROPOFOL N/A 07/24/2021   Procedure: COLONOSCOPY WITH PROPOFOL;  Surgeon: Corbin Ade, MD;  Location: [facility];  Service: Endoscopy;  Laterality: N/A;  2:00pm   ERCP  1997   [facility]:  biliary manometry abnormal, subsequent sphincterotomy    ESOPHAGEAL MANOMETRY N/A 12/21/2016   Surgeon: Napoleon Form, MD; EG junction outflow obstruction which could be secondary to large hiatal hernia and possible coiling of catheter.  Not consistent with achalasia or other variant.  No major peristaltic motility disorder.   ESOPHAGOGASTRODUODENOSCOPY (EGD) WITH PROPOFOL N/A 09/14/2013   WJX:[id number] ring. Hiatal hernia. Status post Elease Hashimoto and biopsy disruption.    FOOT SURGERY     X2   HEMORROIDECTOMY     HERNIA REPAIR     inguinal right   HERNIA REPAIR     LAPAROSCOPIC PARAESOPHAGEAL HERNIA REPAIR  02/2017   Dr. Carolynn Sayers [facility])   [place] DILATION N/A 09/14/2013   Procedure: Alvy Beal;  Surgeon: Corbin Ade, MD;  Location: [facility];  Service: Endoscopy;  Laterality: N/A;  56   POLYPECTOMY N/A 09/14/2013   Procedure: POLYPECTOMY;  Surgeon: Corbin Ade, MD;  Location: [facility];  Service: Endoscopy;  Laterality: N/A;   POLYPECTOMY  07/24/2021   Procedure: POLYPECTOMY;  Surgeon: Corbin Ade, MD;  Location: [facility];  Service: Endoscopy;;   TONSILLECTOMY      Social History:  reports that she has never smoked. She has never used smokeless tobacco. She reports that she does not drink alcohol and does not use drugs.   Allergies  Allergen Reactions   Abilify [Aripiprazole] Other (See Comments)    Bad dreams   Geodon [Ziprasidone Hcl] Other (See Comments)    hospitalization specifics   Quetiapine Palpitations   Sulfa Antibiotics Rash   Trazodone And Nefazodone Other (See Comments)    Reaction is unknown    Family History  Problem Relation Age of Onset   Lung cancer Mother    Heart attack Father    Bone cancer Brother    COPD Sister    Colon cancer Neg Hx      Prior to Admission medications   Medication Sig Start Date End Date Taking? Authorizing Provider  acetaminophen (TYLENOL) 500 MG tablet Take 500 mg by mouth every 6 (six) hours as  needed for mild pain or moderate pain. For pain    [provider]  albuterol (PROVENTIL) (2.5 MG/3ML) 0.083% nebulizer solution Take 3 mLs (2.5 mg total) by nebulization every 6 (six) hours as needed for wheezing or shortness of breath. 02/04/22   Nyoka Cowden, MD  albuterol (VENTOLIN HFA) 108 (90 Base) MCG/ACT inhaler Inhale 1 puff into the lungs every 4 (four) hours as needed for wheezing or shortness of breath. For shortness of breath    [provider]  beclomethasone (QVAR) 80 MCG/ACT inhaler Inhale 1 puff into the lungs daily. 06/03/22   Oretha Milch, MD  benztropine (COGENTIN) 1 MG tablet Take 1 tablet (1 mg total) by mouth 2 (two) times daily. 07/28/22   Vassie Loll, MD  Budeson-Glycopyrrol-Formoterol (BREZTRI AEROSPHERE) 160-9-4.8 MCG/ACT AERO Inhale 2 puffs into the lungs in the morning and at bedtime. Patient taking differently: Inhale  1 puff into the lungs in the morning and at bedtime. 03/18/20   Oretha Milch, MD  buPROPion (WELLBUTRIN) 75 MG tablet Take 1 tablet (75 mg total) by mouth 2 (two) times daily. 07/28/22   Vassie Loll, MD  cetirizine (ZYRTEC) 5 MG tablet Take 5 mg by mouth at bedtime.    [provider]  chlorhexidine (PERIDEX) 0.12 % solution Use as directed 15 mLs in the mouth or throat 2 (two) times daily. 07/09/21   [provider]  cholecalciferol (VITAMIN D3) 25 MCG (1000 UNIT) tablet Take 1,000 Units by mouth daily.    [provider]  divalproex (DEPAKOTE SPRINKLE) 125 MG capsule Take 1 capsule (125 mg total) by mouth every 12 (twelve) hours. May be swallowed whole or sprinkle capsule contents on soft food (such as applesauce); swallow the drug/food mixture immediately (avoid chewing) and do not store for future use. 07/28/22   Vassie Loll, MD  famotidine (PEPCID) 20 MG tablet One after supper 12/23/21   Nyoka Cowden, MD  feeding supplement (ENSURE ENLIVE / ENSURE PLUS) LIQD Take 237 mLs by mouth 2 (two) times daily  between meals. 07/28/22   Vassie Loll, MD  fluticasone [facility]) 50 MCG/ACT nasal spray Place 2 sprays into both nostrils daily.    [provider]  gabapentin (NEURONTIN) 300 MG capsule Take 300 mg by mouth at bedtime.    [provider]  ketoconazole (NIZORAL) 2 % cream Apply 1 Application topically daily as needed for irritation.    [provider]  lamoTRIgine (LAMICTAL) 200 MG tablet Take 200 mg by mouth daily. 06/05/19   [provider]  latanoprost (XALATAN) 0.005 % ophthalmic solution Place 1 drop into both eyes daily as needed. 04/20/13   [provider]  levothyroxine (SYNTHROID, LEVOTHROID) 50 MCG tablet Take 50 mcg by mouth daily.    [provider]  losartan (COZAAR) 25 MG tablet Take 1 tablet (25 mg total) by mouth daily. 08/14/20   Danford, Earl Lites, MD  melatonin 5 MG TABS Take 5 mg by mouth at bedtime. 11/17/17   [provider]  metoprolol tartrate (LOPRESSOR) 25 MG tablet Take 12.5 mg by mouth 2 (two) times daily. 01/18/22   [provider]  montelukast (SINGULAIR) 10 MG tablet Take 10 mg by mouth daily. 05/03/13   [provider]  Multiple Vitamins-Minerals (PRESERVISION/LUTEIN PO) Take 1 tablet by mouth 2 (two) times daily.     [provider]  pantoprazole (PROTONIX) 40 MG tablet Take 1 tablet (40 mg total) by mouth daily. Take 30-60 min before first meal of the day 12/23/21   Nyoka Cowden, MD    Physical Exam: BP (!) 156/68   Pulse 85   Temp 98.4 F (36.9 C) (Oral)   Resp 19   Ht 5' (1.524 m)   Wt 56.2 kg   SpO2 95%   BMI 24.22 kg/m   General: 77 y.o. year-old female well developed well nourished in no acute distress.  Alert and oriented x3. HEENT: NCAT, EOMI Neck: Supple, trachea medial Cardiovascular: Regular rate and rhythm with no rubs or gallops.  No thyromegaly or JVD noted.  No lower extremity edema. 2/4 pulses in all 4 extremities. Respiratory: Clear to  auscultation with no wheezes or rales. Good inspiratory effort. Abdomen: Soft, nontender nondistended with normal bowel sounds x4 quadrants. Muskuloskeletal: Tender to palpation of lower back.  Contracted right-sided middle and ring finger.  No cyanosis, clubbing or edema noted bilaterally Neuro: CN  II-XII intact, strength 5/5 x 4, sensation, reflexes intact Skin: No ulcerative lesions noted or rashes Psychiatry: Judgement and insight appear normal. Mood is appropriate for condition and setting          Labs on Admission:  Basic Metabolic Panel: Recent Labs  Lab 11/09/22 1322  NA 136  K 3.9  CL 100  CO2 26  GLUCOSE 103*  BUN 16  CREATININE 0.67  CALCIUM 9.1   Liver Function Tests: No results for input(s): "AST", "ALT", "ALKPHOS", "BILITOT", "PROT", "ALBUMIN" in the last 168 hours. No results for input(s): "LIPASE", "AMYLASE" in the last 168 hours. No results for input(s): "AMMONIA" in the last 168 hours. CBC: Recent Labs  Lab 11/09/22 1322  WBC 12.3*  NEUTROABS 9.3*  HGB 12.2  HCT 39.1  MCV 99.5  PLT 215   Cardiac Enzymes: No results for input(s): "CKTOTAL", "CKMB", "CKMBINDEX", "TROPONINI" in the last 168 hours.  BNP (last 3 results) No results for input(s): "BNP" in the last 8760 hours.  ProBNP (last 3 results) No results for input(s): "PROBNP" in the last 8760 hours.  CBG: No results for input(s): "GLUCAP" in the last 168 hours.  Radiological Exams on Admission: CT L-SPINE NO CHARGE  Result Date: 11/09/2022 CLINICAL DATA:  Trip and fall 2 nights ago, severe right hip pain EXAM: CT CHEST, ABDOMEN, AND PELVIS WITH CONTRAST CT THORACIC AND LUMBAR SPINE WITH CONTRAST TECHNIQUE: Multidetector CT imaging of the chest, abdomen and pelvis was performed following the standard protocol during bolus administration of intravenous contrast. Multidetector CT imaging of the thoracic and lumbar spine was performed following the standard protocol during bolus administration of  intravenous contrast. RADIATION DOSE REDUCTION: This exam was performed according to the departmental dose-optimization program which includes automated exposure control, adjustment of the mA and/or kV according to patient size and/or use of iterative reconstruction technique. CONTRAST:  75mL OMNIPAQUE IOHEXOL 300 MG/ML  SOLN COMPARISON:  CT chest, 07/27/2019 CT abdomen pelvis, 07/09/2013 FINDINGS: CT CHEST FINDINGS Cardiovascular: Aortic atherosclerosis. Normal heart size. Left coronary artery calcifications. No pericardial effusion. Mediastinum/Nodes: No enlarged mediastinal, hilar, or axillary lymph nodes. Thyroid gland, trachea, and esophagus demonstrate no significant findings. Lungs/Pleura: Multifocal heterogeneous airspace opacity and consolidation throughout the lungs, most conspicuously in the right middle lobe (series 9, image 95) and superior segment right lower lobe (series 9, image 83). No pleural effusion or pneumothorax. Musculoskeletal: No chest wall mass or suspicious osseous lesions identified. CT ABDOMEN PELVIS FINDINGS Hepatobiliary: No solid liver abnormality is seen. No gallstones, gallbladder wall thickening, or biliary dilatation. Pancreas: Fatty atrophy of the pancreatic parenchyma. No pancreatic ductal dilatation or surrounding inflammatory changes. Spleen: Normal in size without significant abnormality. Adrenals/Urinary Tract: Adrenal glands are unremarkable. Kidneys are normal, without renal calculi, solid lesion, or hydronephrosis. Bladder is unremarkable. Stomach/Bowel: Evidence of prior hiatal hernia repair. Stomach is otherwise within normal limits. Appendix not clearly visualized and may be surgically absent. No evidence of bowel wall thickening, distention, or inflammatory changes. Vascular/Lymphatic: Aortic atherosclerosis. No enlarged abdominal or pelvic lymph nodes. Reproductive: No mass or other abnormality. Other: Small fat containing umbilical hernia.  No ascites.  Musculoskeletal: No acute osseous findings. CT THORACIC AND LUMBAR SPINE FINDINGS Alignment: Normal thoracic kyphosis. Minimal degenerative anterolisthesis of L4 on L5, with otherwise normal lumbar lordosis. Vertebral bodies: Mild, acute appearing superior endplate deformity of L2 without significant anterior height loss (series 5, image 33). Mild superior endplate deformity and buckled anterior cortical fracture of the S1 and S2 sacral segments (series 5,  image 34). Minimal, nonacute wedging of the T11 vertebral body. Disc spaces: Intact. Paraspinous soft tissues: Unremarkable. IMPRESSION: 1. Mild, acute appearing superior endplate deformity of L2 without significant anterior height loss. 2. Mild superior endplate deformity and buckled anterior cortical fracture of the S1 and S2 sacral segments. 3. No evidence of acute traumatic injury to the organs of the chest, abdomen, or pelvis. 4. No evidence of displaced right hip fracture per report of pain and inability to bear weight. 5. Multifocal heterogeneous airspace opacity and consolidation throughout the lungs, most conspicuously in the right middle lobe and superior segment right lower lobe. Findings are most consistent with multifocal infection or aspiration. 6. Coronary artery disease. Aortic Atherosclerosis (ICD10-I70.0). Electronically Signed   By: Jearld Lesch M.D.   On: 11/09/2022 19:50   CT CHEST ABDOMEN PELVIS W CONTRAST  Result Date: 11/09/2022 CLINICAL DATA:  Trip and fall 2 nights ago, severe right hip pain EXAM: CT CHEST, ABDOMEN, AND PELVIS WITH CONTRAST CT THORACIC AND LUMBAR SPINE WITH CONTRAST TECHNIQUE: Multidetector CT imaging of the chest, abdomen and pelvis was performed following the standard protocol during bolus administration of intravenous contrast. Multidetector CT imaging of the thoracic and lumbar spine was performed following the standard protocol during bolus administration of intravenous contrast. RADIATION DOSE REDUCTION: This  exam was performed according to the departmental dose-optimization program which includes automated exposure control, adjustment of the mA and/or kV according to patient size and/or use of iterative reconstruction technique. CONTRAST:  75mL OMNIPAQUE IOHEXOL 300 MG/ML  SOLN COMPARISON:  CT chest, 07/27/2019 CT abdomen pelvis, 07/09/2013 FINDINGS: CT CHEST FINDINGS Cardiovascular: Aortic atherosclerosis. Normal heart size. Left coronary artery calcifications. No pericardial effusion. Mediastinum/Nodes: No enlarged mediastinal, hilar, or axillary lymph nodes. Thyroid gland, trachea, and esophagus demonstrate no significant findings. Lungs/Pleura: Multifocal heterogeneous airspace opacity and consolidation throughout the lungs, most conspicuously in the right middle lobe (series 9, image 95) and superior segment right lower lobe (series 9, image 83). No pleural effusion or pneumothorax. Musculoskeletal: No chest wall mass or suspicious osseous lesions identified. CT ABDOMEN PELVIS FINDINGS Hepatobiliary: No solid liver abnormality is seen. No gallstones, gallbladder wall thickening, or biliary dilatation. Pancreas: Fatty atrophy of the pancreatic parenchyma. No pancreatic ductal dilatation or surrounding inflammatory changes. Spleen: Normal in size without significant abnormality. Adrenals/Urinary Tract: Adrenal glands are unremarkable. Kidneys are normal, without renal calculi, solid lesion, or hydronephrosis. Bladder is unremarkable. Stomach/Bowel: Evidence of prior hiatal hernia repair. Stomach is otherwise within normal limits. Appendix not clearly visualized and may be surgically absent. No evidence of bowel wall thickening, distention, or inflammatory changes. Vascular/Lymphatic: Aortic atherosclerosis. No enlarged abdominal or pelvic lymph nodes. Reproductive: No mass or other abnormality. Other: Small fat containing umbilical hernia.  No ascites. Musculoskeletal: No acute osseous findings. CT THORACIC AND  LUMBAR SPINE FINDINGS Alignment: Normal thoracic kyphosis. Minimal degenerative anterolisthesis of L4 on L5, with otherwise normal lumbar lordosis. Vertebral bodies: Mild, acute appearing superior endplate deformity of L2 without significant anterior height loss (series 5, image 33). Mild superior endplate deformity and buckled anterior cortical fracture of the S1 and S2 sacral segments (series 5, image 34). Minimal, nonacute wedging of the T11 vertebral body. Disc spaces: Intact. Paraspinous soft tissues: Unremarkable. IMPRESSION: 1. Mild, acute appearing superior endplate deformity of L2 without significant anterior height loss. 2. Mild superior endplate deformity and buckled anterior cortical fracture of the S1 and S2 sacral segments. 3. No evidence of acute traumatic injury to the organs of the chest, abdomen, or pelvis. 4.  No evidence of displaced right hip fracture per report of pain and inability to bear weight. 5. Multifocal heterogeneous airspace opacity and consolidation throughout the lungs, most conspicuously in the right middle lobe and superior segment right lower lobe. Findings are most consistent with multifocal infection or aspiration. 6. Coronary artery disease. Aortic Atherosclerosis (ICD10-I70.0). Electronically Signed   By: Jearld Lesch M.D.   On: 11/09/2022 19:50   CT T-SPINE NO CHARGE  Result Date: 11/09/2022 CLINICAL DATA:  Trip and fall 2 nights ago, severe right hip pain EXAM: CT CHEST, ABDOMEN, AND PELVIS WITH CONTRAST CT THORACIC AND LUMBAR SPINE WITH CONTRAST TECHNIQUE: Multidetector CT imaging of the chest, abdomen and pelvis was performed following the standard protocol during bolus administration of intravenous contrast. Multidetector CT imaging of the thoracic and lumbar spine was performed following the standard protocol during bolus administration of intravenous contrast. RADIATION DOSE REDUCTION: This exam was performed according to the departmental dose-optimization program  which includes automated exposure control, adjustment of the mA and/or kV according to patient size and/or use of iterative reconstruction technique. CONTRAST:  75mL OMNIPAQUE IOHEXOL 300 MG/ML  SOLN COMPARISON:  CT chest, 07/27/2019 CT abdomen pelvis, 07/09/2013 FINDINGS: CT CHEST FINDINGS Cardiovascular: Aortic atherosclerosis. Normal heart size. Left coronary artery calcifications. No pericardial effusion. Mediastinum/Nodes: No enlarged mediastinal, hilar, or axillary lymph nodes. Thyroid gland, trachea, and esophagus demonstrate no significant findings. Lungs/Pleura: Multifocal heterogeneous airspace opacity and consolidation throughout the lungs, most conspicuously in the right middle lobe (series 9, image 95) and superior segment right lower lobe (series 9, image 83). No pleural effusion or pneumothorax. Musculoskeletal: No chest wall mass or suspicious osseous lesions identified. CT ABDOMEN PELVIS FINDINGS Hepatobiliary: No solid liver abnormality is seen. No gallstones, gallbladder wall thickening, or biliary dilatation. Pancreas: Fatty atrophy of the pancreatic parenchyma. No pancreatic ductal dilatation or surrounding inflammatory changes. Spleen: Normal in size without significant abnormality. Adrenals/Urinary Tract: Adrenal glands are unremarkable. Kidneys are normal, without renal calculi, solid lesion, or hydronephrosis. Bladder is unremarkable. Stomach/Bowel: Evidence of prior hiatal hernia repair. Stomach is otherwise within normal limits. Appendix not clearly visualized and may be surgically absent. No evidence of bowel wall thickening, distention, or inflammatory changes. Vascular/Lymphatic: Aortic atherosclerosis. No enlarged abdominal or pelvic lymph nodes. Reproductive: No mass or other abnormality. Other: Small fat containing umbilical hernia.  No ascites. Musculoskeletal: No acute osseous findings. CT THORACIC AND LUMBAR SPINE FINDINGS Alignment: Normal thoracic kyphosis. Minimal degenerative  anterolisthesis of L4 on L5, with otherwise normal lumbar lordosis. Vertebral bodies: Mild, acute appearing superior endplate deformity of L2 without significant anterior height loss (series 5, image 33). Mild superior endplate deformity and buckled anterior cortical fracture of the S1 and S2 sacral segments (series 5, image 34). Minimal, nonacute wedging of the T11 vertebral body. Disc spaces: Intact. Paraspinous soft tissues: Unremarkable. IMPRESSION: 1. Mild, acute appearing superior endplate deformity of L2 without significant anterior height loss. 2. Mild superior endplate deformity and buckled anterior cortical fracture of the S1 and S2 sacral segments. 3. No evidence of acute traumatic injury to the organs of the chest, abdomen, or pelvis. 4. No evidence of displaced right hip fracture per report of pain and inability to bear weight. 5. Multifocal heterogeneous airspace opacity and consolidation throughout the lungs, most conspicuously in the right middle lobe and superior segment right lower lobe. Findings are most consistent with multifocal infection or aspiration. 6. Coronary artery disease. Aortic Atherosclerosis (ICD10-I70.0). Electronically Signed   By: Jearld Lesch M.D.   On: 11/09/2022  19:50   CT Head Wo Contrast  Result Date: 11/09/2022 CLINICAL DATA:  Trip and fall 2 nights ago pain EXAM: CT HEAD WITHOUT CONTRAST CT CERVICAL SPINE WITHOUT CONTRAST TECHNIQUE: Multidetector CT imaging of the head and cervical spine was performed following the standard protocol without intravenous contrast. Multiplanar CT image reconstructions of the cervical spine were also generated. RADIATION DOSE REDUCTION: This exam was performed according to the departmental dose-optimization program which includes automated exposure control, adjustment of the mA and/or kV according to patient size and/or use of iterative reconstruction technique. COMPARISON:  07/15/2022 FINDINGS: CT HEAD FINDINGS Brain: No evidence of acute  infarction, hemorrhage, hydrocephalus, extra-axial collection or mass lesion/mass effect. Periventricular white matter hypodensity. Vascular: No hyperdense vessel or unexpected calcification. Skull: Normal. Negative for fracture or focal lesion. Sinuses/Orbits: No acute finding. Other: None. CT CERVICAL SPINE FINDINGS Alignment: Normal. Skull base and vertebrae: No acute fracture. No primary bone lesion or focal pathologic process. Soft tissues and spinal canal: No prevertebral fluid or swelling. No visible canal hematoma. Disc levels: Focally moderate disc space height loss and osteophytosis of C5-C6 with otherwise mild disc degenerative change. Upper chest: Negative. Other: None. IMPRESSION: 1. No acute intracranial pathology. 2. No fracture or static subluxation of the cervical spine. 3. Focally moderate disc space height loss and osteophytosis of C5-C6 with otherwise mild disc degenerative change. Electronically Signed   By: Jearld Lesch M.D.   On: 11/09/2022 19:25   CT Cervical Spine Wo Contrast  Result Date: 11/09/2022 CLINICAL DATA:  Trip and fall 2 nights ago pain EXAM: CT HEAD WITHOUT CONTRAST CT CERVICAL SPINE WITHOUT CONTRAST TECHNIQUE: Multidetector CT imaging of the head and cervical spine was performed following the standard protocol without intravenous contrast. Multiplanar CT image reconstructions of the cervical spine were also generated. RADIATION DOSE REDUCTION: This exam was performed according to the departmental dose-optimization program which includes automated exposure control, adjustment of the mA and/or kV according to patient size and/or use of iterative reconstruction technique. COMPARISON:  07/15/2022 FINDINGS: CT HEAD FINDINGS Brain: No evidence of acute infarction, hemorrhage, hydrocephalus, extra-axial collection or mass lesion/mass effect. Periventricular white matter hypodensity. Vascular: No hyperdense vessel or unexpected calcification. Skull: Normal. Negative for fracture or  focal lesion. Sinuses/Orbits: No acute finding. Other: None. CT CERVICAL SPINE FINDINGS Alignment: Normal. Skull base and vertebrae: No acute fracture. No primary bone lesion or focal pathologic process. Soft tissues and spinal canal: No prevertebral fluid or swelling. No visible canal hematoma. Disc levels: Focally moderate disc space height loss and osteophytosis of C5-C6 with otherwise mild disc degenerative change. Upper chest: Negative. Other: None. IMPRESSION: 1. No acute intracranial pathology. 2. No fracture or static subluxation of the cervical spine. 3. Focally moderate disc space height loss and osteophytosis of C5-C6 with otherwise mild disc degenerative change. Electronically Signed   By: Jearld Lesch M.D.   On: 11/09/2022 19:25   DG Hip Unilat W or Wo Pelvis 2-3 Views Right  Result Date: 11/09/2022 CLINICAL DATA:  Right hip pain after falling. EXAM: DG HIP (WITH OR WITHOUT PELVIS) 2-3V RIGHT COMPARISON:  Pelvic radiographs 08/03/2020. Left hip MRI 12/15/2019. FINDINGS: The mineralization and alignment are normal. There is no evidence of acute fracture or dislocation. No evidence of femoral head osteonecrosis. Moderately advanced asymmetric left hip osteoarthritis again noted. There is minimal right hip joint space narrowing. The soft tissues appear unremarkable. IMPRESSION: No evidence of acute fracture or dislocation. Moderately advanced left hip osteoarthritis. Electronically Signed   By: Carey Bullocks  M.D.   On: 11/09/2022 14:59    EKG: I independently viewed the EKG done and my findings are as followed: EKG was not done in the ED  Assessment/Plan Present on Admission:  Aspiration pneumonia (HCC)  Acquired hypothyroidism  Essential hypertension  Asthma, chronic  Gastroesophageal reflux disease without esophagitis  Principal Problem:   Aspiration pneumonia (HCC) Active Problems:   Acquired hypothyroidism   Asthma, chronic   Gastroesophageal reflux disease without esophagitis    Essential hypertension   Compression fracture of pelvis (HCC)  Aspiration pneumonia CT chest, abdomen and pelvis was suggestive of aspiration/multiple focal infection Patient was started on ceftriaxone and azithromycin, we shall continue with azithromycin and Unasyn at this time with plan to de-escalate/discontinue based on blood culture, sputum culture, urine Legionella, strep pneumo and procalcitonin Continue Tylenol as needed Continue Mucinex, incentive spirometry, flutter valve   Compression fracture CT thoracic and lumbar spine was suggestive of endplate deformity of L2 and anterior cortical fracture of the S1 and S2 sacral segments Continue Tylenol as needed Continue Percocet as needed for moderate and severe pain TLSO brace was provided Continue PT/OT eval and treat  Essential hypertension Continue Lopressor and losartan  Acquired hypothyroidism Continue Synthroid  Asthma Continue Breztri  GERD Continue Protonix  DVT prophylaxis: Lovenox   Advance Care Planning: CODE STATUS: Full code  Consults: None  Family Communication: None at bedside  Severity of Illness: The appropriate patient status for this patient is INPATIENT. Inpatient status is judged to be reasonable and necessary in order to provide the required intensity of service to ensure the patient's safety. The patient's presenting symptoms, physical exam findings, and initial radiographic and laboratory data in the context of their chronic comorbidities is felt to place them at high risk for further clinical deterioration. Furthermore, it is not anticipated that the patient will be medically stable for discharge from the hospital within 2 midnights of admission.   * I certify that at the point of admission it is my clinical judgment that the patient will require inpatient hospital care spanning beyond 2 midnights from the point of admission due to high intensity of service, high risk for further deterioration and  high frequency of surveillance required.*  Author: Frankey Shown, DO 11/10/2022 1:12 AM  For on call review [email or web address].

## 2022-11-10 ENCOUNTER — Encounter (HOSPITAL_COMMUNITY): Payer: Self-pay | Admitting: Internal Medicine

## 2022-11-10 DIAGNOSIS — S329XXA Fracture of unspecified parts of lumbosacral spine and pelvis, initial encounter for closed fracture: Secondary | ICD-10-CM | POA: Insufficient documentation

## 2022-11-10 DIAGNOSIS — J690 Pneumonitis due to inhalation of food and vomit: Secondary | ICD-10-CM | POA: Diagnosis not present

## 2022-11-10 LAB — COMPREHENSIVE METABOLIC PANEL
ALT: 18 U/L (ref 0–44)
AST: 22 U/L (ref 15–41)
Albumin: 3.4 g/dL — ABNORMAL LOW (ref 3.5–5.0)
Alkaline Phosphatase: 54 U/L (ref 38–126)
Anion gap: 11 (ref 5–15)
BUN: 14 mg/dL (ref 8–23)
CO2: 24 mmol/L (ref 22–32)
Calcium: 8.5 mg/dL — ABNORMAL LOW (ref 8.9–10.3)
Chloride: 100 mmol/L (ref 98–111)
Creatinine, Ser: 0.69 mg/dL (ref 0.44–1.00)
GFR, Estimated: >60 mL/min (ref >60)
Glucose, Bld: 79 mg/dL (ref 70–99)
Potassium: 3.3 mmol/L — ABNORMAL LOW (ref 3.5–5.1)
Sodium: 135 mmol/L (ref 135–145)
Total Bilirubin: 0.8 mg/dL (ref 0.3–1.2)
Total Protein: 6 g/dL — ABNORMAL LOW (ref 6.5–8.1)

## 2022-11-10 LAB — PHOSPHORUS: Phosphorus: 3.1 mg/dL (ref 2.5–4.6)

## 2022-11-10 LAB — CBC
HCT: 32.7 % — ABNORMAL LOW (ref 36.0–46.0)
Hemoglobin: 10.6 g/dL — ABNORMAL LOW (ref 12.0–15.0)
MCH: 31.7 pg (ref 26.0–34.0)
MCHC: 32.4 g/dL (ref 30.0–36.0)
MCV: 97.9 fL (ref 80.0–100.0)
Platelets: 188 10*3/uL (ref 150–400)
RBC: 3.34 MIL/uL — ABNORMAL LOW (ref 3.87–5.11)
RDW: 13.9 % (ref 11.5–15.5)
WBC: 10.3 10*3/uL (ref 4.0–10.5)
nRBC: 0 % (ref 0.0–0.2)

## 2022-11-10 LAB — PROCALCITONIN: Procalcitonin: <0.1 ng/mL

## 2022-11-10 LAB — MAGNESIUM: Magnesium: 2 mg/dL (ref 1.7–2.4)

## 2022-11-10 MED ORDER — DM-GUAIFENESIN ER 30-600 MG PO TB12
1.0000 | ORAL_TABLET | Freq: Two times a day (BID) | ORAL | Status: DC
  Administered 2022-11-10 – 2022-11-12 (×6): 1 via ORAL
  Filled 2022-11-10 (×6): qty 1

## 2022-11-10 MED ORDER — ONDANSETRON HCL 4 MG/2ML IJ SOLN: 4.0000 mg | Freq: Four times a day (QID) | INTRAMUSCULAR | Status: DC | PRN

## 2022-11-10 MED ORDER — UMECLIDINIUM BROMIDE 62.5 MCG/ACT IN AEPB
1.0000 | INHALATION_SPRAY | Freq: Every day | RESPIRATORY_TRACT | Status: DC
  Administered 2022-11-10 – 2022-11-12 (×3): 1 via RESPIRATORY_TRACT
  Filled 2022-11-10: qty 7

## 2022-11-10 MED ORDER — METOPROLOL TARTRATE 25 MG PO TABS
12.5000 mg | ORAL_TABLET | Freq: Two times a day (BID) | ORAL | Status: DC
  Administered 2022-11-10 – 2022-11-11 (×5): 12.5 mg via ORAL
  Filled 2022-11-10 (×5): qty 1

## 2022-11-10 MED ORDER — BUDESON-GLYCOPYRROL-FORMOTEROL 160-9-4.8 MCG/ACT IN AERO: 2.0000 | INHALATION_SPRAY | Freq: Two times a day (BID) | RESPIRATORY_TRACT | Status: DC

## 2022-11-10 MED ORDER — ONDANSETRON HCL 4 MG PO TABS: 4.0000 mg | ORAL_TABLET | Freq: Four times a day (QID) | ORAL | Status: DC | PRN

## 2022-11-10 MED ORDER — LEVOTHYROXINE SODIUM 100 MCG PO TABS
50.0000 ug | ORAL_TABLET | Freq: Every day | ORAL | Status: DC
  Administered 2022-11-10 – 2022-11-12 (×3): 50 ug via ORAL
  Filled 2022-11-10 (×3): qty 1

## 2022-11-10 MED ORDER — LOSARTAN POTASSIUM 25 MG PO TABS
25.0000 mg | ORAL_TABLET | Freq: Every day | ORAL | Status: DC
  Administered 2022-11-10: 25 mg via ORAL
  Filled 2022-11-10: qty 1

## 2022-11-10 MED ORDER — OXYCODONE-ACETAMINOPHEN 5-325 MG PO TABS
1.0000 | ORAL_TABLET | ORAL | Status: DC | PRN
  Administered 2022-11-10 – 2022-11-12 (×13): 1 via ORAL
  Filled 2022-11-10 (×13): qty 1

## 2022-11-10 MED ORDER — INFLUENZA VAC A&B SURF ANT ADJ 0.5 ML IM SUSY
0.5000 mL | PREFILLED_SYRINGE | INTRAMUSCULAR | Status: AC
  Administered 2022-11-11: 0.5 mL via INTRAMUSCULAR
  Filled 2022-11-10: qty 0.5

## 2022-11-10 MED ORDER — SODIUM CHLORIDE 0.9 % IV SOLN
3.0000 g | Freq: Four times a day (QID) | INTRAVENOUS | Status: DC
  Administered 2022-11-10 (×2): 3 g via INTRAVENOUS
  Filled 2022-11-10: qty 8
  Filled 2022-11-10: qty 3
  Filled 2022-11-10: qty 8

## 2022-11-10 MED ORDER — POTASSIUM CHLORIDE CRYS ER 20 MEQ PO TBCR
40.0000 meq | EXTENDED_RELEASE_TABLET | Freq: Two times a day (BID) | ORAL | Status: AC
  Administered 2022-11-10 (×2): 40 meq via ORAL
  Filled 2022-11-10 (×2): qty 2

## 2022-11-10 MED ORDER — ACETAMINOPHEN 325 MG PO TABS
650.0000 mg | ORAL_TABLET | Freq: Four times a day (QID) | ORAL | Status: DC | PRN
  Administered 2022-11-12: 650 mg via ORAL
  Filled 2022-11-10: qty 2

## 2022-11-10 MED ORDER — ENOXAPARIN SODIUM 40 MG/0.4ML IJ SOSY
40.0000 mg | PREFILLED_SYRINGE | INTRAMUSCULAR | Status: DC
  Administered 2022-11-10: 40 mg via SUBCUTANEOUS
  Filled 2022-11-10: qty 0.4

## 2022-11-10 MED ORDER — MOMETASONE FURO-FORMOTEROL FUM 200-5 MCG/ACT IN AERO
2.0000 | INHALATION_SPRAY | Freq: Two times a day (BID) | RESPIRATORY_TRACT | Status: DC
  Administered 2022-11-10 – 2022-11-12 (×5): 2 via RESPIRATORY_TRACT
  Filled 2022-11-10: qty 8.8

## 2022-11-10 MED ORDER — SODIUM CHLORIDE 0.9 % IV SOLN
3.0000 g | Freq: Four times a day (QID) | INTRAVENOUS | Status: DC
  Administered 2022-11-11 – 2022-11-12 (×7): 3 g via INTRAVENOUS
  Filled 2022-11-10 (×9): qty 8

## 2022-11-10 MED ORDER — SODIUM CHLORIDE 0.9 % IV SOLN
500.0000 mg | INTRAVENOUS | Status: DC
  Administered 2022-11-10 – 2022-11-11 (×2): 500 mg via INTRAVENOUS
  Filled 2022-11-10 (×2): qty 5

## 2022-11-10 MED ORDER — PANTOPRAZOLE SODIUM 40 MG PO TBEC
40.0000 mg | DELAYED_RELEASE_TABLET | Freq: Every day | ORAL | Status: DC
  Administered 2022-11-10 – 2022-11-12 (×3): 40 mg via ORAL
  Filled 2022-11-10 (×3): qty 1

## 2022-11-10 MED ORDER — ACETAMINOPHEN 650 MG RE SUPP: 650.0000 mg | Freq: Four times a day (QID) | RECTAL | Status: DC | PRN

## 2022-11-10 NOTE — Evaluation (Signed)
Occupational Therapy Evaluation Patient Details Name: Heidi Fuentes MRN: [id number] DOB: August 16, 1945 Today's Date: 11/10/2022   History of Present Illness Heidi Fuentes is a 77 y.o. female with medical history significant of hypertension, asthma, bipolar disorder who presents to the emergency department due to sustaining a fall about 2 days ago whereby she lost her balance while getting into a bed and she landed on her bottom and accidentally hit her head in the process.  She developed swelling of her scalp which has since resolved, but now complaining of right hip pain and right lower back with difficulty of bearing weight on her legs. (per DO)   Clinical Impression   Pt agreeable to OT and PT co-evaluation. Pt reports PRN assist at home at this time. Pt required mod to max A for bed mobility and transfers today. B UE are generally weak. Much assist and time for step pivot transfer to chair. Pt unable to complete lower body dressing tasks due to pain. Pt was left in the chair with call bell within reach. Pt will benefit from continued OT in the hospital and recommended venue below to increase strength, balance, and endurance for safe ADL's.          If plan is discharge home, recommend the following: A lot of help with walking and/or transfers;A lot of help with bathing/dressing/bathroom;Assistance with cooking/housework;Assist for transportation;Help with stairs or ramp for entrance    Functional Status Assessment  Patient has had a recent decline in their functional status and demonstrates the ability to make significant improvements in function in a reasonable and predictable amount of time.  Equipment Recommendations  None recommended by OT           Precautions / Restrictions Precautions Precautions: Fall Restrictions Weight Bearing Restrictions: No      Mobility Bed Mobility Overal bed mobility: Needs Assistance Bed Mobility: Rolling, Sidelying to Sit Rolling: Mod  assist Sidelying to sit: Mod assist, Max assist, HOB elevated       General bed mobility comments: Much assist; educated on log roll technique.    Transfers Overall transfer level: Needs assistance Equipment used: Rolling walker (2 wheels) Transfers: Sit to/from Stand, Bed to chair/wheelchair/BSC Sit to Stand: Mod assist, Max assist     Step pivot transfers: Mod assist, Max assist     General transfer comment: Unsteady labored movement; extended time.      Balance Overall balance assessment: Needs assistance Sitting-balance support: No upper extremity supported, Feet supported Sitting balance-Leahy Scale: Fair Sitting balance - Comments: fair to good at EOB   Standing balance support: Bilateral upper extremity supported, During functional activity, Reliant on assistive device for balance Standing balance-Leahy Scale: Poor Standing balance comment: using RW                           ADL either performed or assessed with clinical judgement   ADL Overall ADL's : Needs assistance/impaired     Grooming: Set up;Sitting   Upper Body Bathing: Set up;Contact guard assist;Sitting   Lower Body Bathing: Maximal assistance;Sitting/lateral leans   Upper Body Dressing : Set up;Contact guard assist;Sitting   Lower Body Dressing: Maximal assistance;Sitting/lateral leans Lower Body Dressing Details (indicate cue type and reason): Assited to adjust socks today. Toilet Transfer: Moderate assistance;Maximal assistance;Stand-pivot;Rolling walker (2 wheels) Toilet Transfer Details (indicate cue type and reason): Simulated via EOB to chair transfer. Toileting- Clothing Manipulation and Hygiene: Maximal assistance;Sitting/lateral lean;Bed level;Moderate assistance  Vision Baseline Vision/History: 1 Wears glasses Ability to See in Adequate Light: 1 Impaired Patient Visual Report: No change from baseline Vision Assessment?: No apparent visual deficits      Perception Perception: Not tested       Praxis Praxis: Not tested       Pertinent Vitals/Pain Pain Assessment Pain Assessment: Faces Faces Pain Scale: Hurts even more Pain Location: back Pain Descriptors / Indicators: Grimacing Pain Intervention(s): Limited activity within patient's tolerance, Monitored during session, Repositioned     Extremity/Trunk Assessment Upper Extremity Assessment Upper Extremity Assessment: Generalized weakness   Lower Extremity Assessment Lower Extremity Assessment: Defer to PT evaluation   Cervical / Trunk Assessment Cervical / Trunk Assessment: Kyphotic   Communication Communication Communication: No apparent difficulties   Cognition Arousal: Alert Behavior During Therapy: WFL for tasks assessed/performed Overall Cognitive Status: Within Functional Limits for tasks assessed                                                        Home Living Family/patient expects to be discharged to:: Private residence Living Arrangements: Alone Available Help at Discharge: Family;Available PRN/intermittently Type of Home: House Home Access: Ramped entrance     Home Layout: One level     Bathroom Shower/Tub: Chief Strategy Officer: Standard Bathroom Accessibility: Yes   Home Equipment: Agricultural consultant (2 wheels);Cane - single point;Wheelchair Financial trader (4 wheels);Shower seat;Lift chair;Transport chair   Additional Comments: Pt reported no change in living history.      Prior Functioning/Environment Prior Level of Function : Needs assist             Mobility Comments: Ambulates within the home without AD; RW used in community. Assisted for toilet transfer. ADLs Comments: Pt reports assist for toilet transfer; wears depends. Reports independence with bathing and dressing. IADL assist via a cleaning person.        OT Problem List: Decreased strength;Decreased activity tolerance;Impaired balance  (sitting and/or standing);Pain      OT Treatment/Interventions: Self-care/ADL training;Therapeutic exercise;Therapeutic activities;Patient/family education;DME and/or AE instruction    OT Goals(Current goals can be found in the care plan section) Acute Rehab OT Goals Patient Stated Goal: return home OT Goal Formulation: With patient Time For Goal Achievement: 11/24/22 Potential to Achieve Goals: Fair  OT Frequency: Min 2X/week    Co-evaluation PT/OT/SLP Co-Evaluation/Treatment: Yes Reason for Co-Treatment: To address functional/ADL transfers   OT goals addressed during session: ADL's and self-care                       End of Session Equipment Utilized During Treatment: Rolling walker (2 wheels)  Activity Tolerance: Patient tolerated treatment well Patient left: in chair;with call bell/phone within reach  OT Visit Diagnosis: Unsteadiness on feet (R26.81);Other abnormalities of gait and mobility (R26.89);Muscle weakness (generalized) (M62.81);History of falling (Z91.81)                Time: 1324-4010 OT Time Calculation (min): 14 min Charges:  OT General Charges $OT Visit: 1 Visit OT Evaluation $OT Eval Low Complexity: 1 Low  Alejandro Gamel OT, MOT  Danie Chandler 11/10/2022, 9:23 AM

## 2022-11-10 NOTE — TOC Initial Note (Signed)
Transition of Care [facility]) - Initial/Assessment Note    Patient Details  Name: Heidi Fuentes MRN: [id number] Date of Birth: 02-09-1945  Transition of Care [facility]) CM/SW Contact:    Leitha Bleak, RN Phone Number: 11/10/2022, 11:08 AM  Clinical Narrative:    Patient admitted with aspiration pneumonia. Recent fall at home. Patient has a high risk for readmission. CM at the bedside, PT recommending SNF and TLSO brace. CM called [employer], they will come to measure and bring a brace later today. RN updated.    Patient state she was recently discharged for [facility]. She is agreeable to return, but unsure if her insurance will pay. TOC sent referral and message to [name], Liaison to review charges.  Patient is active with [facility] home [facility].  She has has 2 sitters but they have worked out, They sleep or sit out and smoke her patient. TOC following.              Expected Discharge Plan: Skilled Nursing Facility Barriers to Discharge: Continued Medical Work up  Patient Goals and CMS Choice Patient states their goals for this hospitalization and ongoing recovery are:: agreeable to SNF CMS Medicare.gov Compare Post Acute Care list provided to:: Patient Choice offered to / list presented to : Patient    Expected Discharge Plan and Services      Living arrangements for the past 2 months: Single Family Home                 DME Arranged: Brace, LSO (TLSO) DME Agency: Other - [facility] [employer]) Date DME Agency Contacted: 11/10/22 Time DME Agency Contacted: 1108 Representative spoke with at DME Agency: called [phone]     Prior Living Arrangements/Services Living arrangements for the past 2 months: Single Family Home     Current home services: DME   Activities of Daily Living   ADL Screening (condition at time of admission) Independently performs ADLs?: No Does the patient have a NEW difficulty with bathing/dressing/toileting/self-feeding that is expected to  last >3 days?: Yes (Initiates electronic notice to provider for possible OT consult) Does the patient have a NEW difficulty with getting in/out of bed, walking, or climbing stairs that is expected to last >3 days?: Yes (Initiates electronic notice to provider for possible PT consult) Does the patient have a NEW difficulty with communication that is expected to last >3 days?: No Is the patient deaf or have difficulty hearing?: No Does the patient have difficulty seeing, even when wearing glasses/contacts?: No Does the patient have difficulty concentrating, remembering, or making decisions?: No  Permission Sought/Granted   Emotional Assessment   Attitude/Demeanor/Rapport: Engaged Affect (typically observed): Accepting Orientation: : Oriented to Self, Oriented to Place, Oriented to  Time, Oriented to Situation Alcohol / Substance Use: Not Applicable Psych Involvement: No (comment)  Admission diagnosis:  Aspiration pneumonia (HCC) [J69.0] Fall, initial encounter [W19.XXXA] Closed compression fracture of L2 lumbar vertebra, initial encounter (HCC) [S32.020A] Multifocal pneumonia [J18.9] Closed fracture of sacrum, unspecified portion of sacrum, initial encounter (HCC) [S32.10XA] Patient Active Problem List   Diagnosis Date Noted   Compression fracture of pelvis (HCC) 11/10/2022   Aspiration pneumonia (HCC) 11/09/2022   Agitation 07/27/2022   Mixed bipolar I disorder (HCC) 07/22/2022   Delirium 07/22/2022   Depression, major, recurrent, mild (HCC) 07/22/2022   Severe sepsis (HCC) 07/15/2022   UTI (urinary tract infection) 07/15/2022   Encephalopathy, metabolic 07/15/2022   History of adenomatous polyp of colon 05/12/2021   Bipolar affective  disorder, depressed, mild (HCC) 08/07/2020   Rhabdomyolysis 08/03/2020   Elevated LFTs 08/03/2020   Unilateral primary osteoarthritis, left hip 03/26/2020   Pedal edema 03/18/2020   Pain in joint involving ankle and foot 12/20/2019    Bronchiectasis without complication (HCC) 10/31/2019   DOE (dyspnea on exertion) 07/27/2019   Dysphagia 07/25/2018   Leukocytosis 07/25/2018   Closed fracture of left orbital floor (HCC) 07/25/2018   Exudative age-related macular degeneration, bilateral, with active choroidal neovascularization (HCC) 07/25/2018   CAP (community acquired pneumonia) due to MRSA (methicillin resistant Staphylococcus aureus) (HCC) 02/13/2018   Asthma, chronic, unspecified asthma severity, with acute exacerbation 02/04/2018   Cough 11/02/2017   Gastroesophageal reflux disease without esophagitis 10/30/2017   Other specified glaucoma 10/30/2017   Uncomplicated asthma 10/30/2017   Chronic rhinitis 10/30/2017   Long-term current use of opiate analgesic 03/10/2017   Essential hypertension 02/17/2017   On long term drug therapy 02/08/2017   Chronic narcotic use 02/04/2017   Hiatal hernia 02/03/2017   Hypokalemia 09/06/2015   Fall    Iron deficiency anemia, unspecified 12/15/2013   Schatzki's ring 08/17/2013   Anemia 08/17/2013   Encounter for screening colonoscopy 08/17/2013   Constipation 07/10/2013   Chronic pain syndrome 07/10/2013   Acquired hypothyroidism 07/09/2013   Colitis 07/09/2013   Asthma, chronic 07/09/2013   PCP:  Tanna Furry, MD Pharmacy:   [facility] [address], [place] - [address] [place] [address] Phone: [phone] Fax: [phone]  [facility] - [place], [place] - [address] [place] [address] Phone: [phone] Fax: [phone]  [facility] - [place], [place] - [address] [place] [address] Phone: [phone] Fax: [phone]     Social Determinants of Health (SDOH) Social History: SDOH Screenings   Food Insecurity: No Food Insecurity (11/10/2022)  Housing: Low Risk  (11/10/2022)  Transportation Needs: No Transportation  Needs (11/10/2022)  Utilities: Not At Risk (11/10/2022)  Tobacco Use: Low Risk  (11/10/2022)   SDOH Interventions:    Readmission Risk Interventions    11/10/2022   11:05 AM  Readmission Risk Prevention Plan  Transportation Screening Complete  Medication Review (RN Care Manager) Complete  PCP or Specialist appointment within 3-5 days of discharge Not Complete  HRI or Home Care Consult Complete  SW Recovery Care/Counseling Consult Complete  Palliative Care Screening Not Applicable  Skilled Nursing Facility Not Complete

## 2022-11-10 NOTE — Evaluation (Signed)
Physical Therapy Evaluation Patient Details Name: Heidi Fuentes MRN: [id number] DOB: [birth year] Today's Date: 11/10/2022  History of Present Illness  Heidi Fuentes is a 77 y.o. female with medical history significant of hypertension, asthma, bipolar disorder who presents to the emergency department due to sustaining a fall about 2 days ago whereby she lost her balance while getting into a bed and she landed on her bottom and accidentally hit her head in the process.  She developed swelling of her scalp which has since resolved, but now complaining of right hip pain and right lower back with difficulty of bearing weight on her legs.   Clinical Impression  Patient demonstrates slow labored movement for sitting up at bedside with poor carryover for rolling to side/sitting up from side lying position due to increased low back/sacral pain.  Patient limited to a few slow labored side steps with frequent buckling of knees due to weakness and poor standing balance.  Patient tolerated sitting up in chair after therapy - nursing staff aware.  Patient will benefit from continued skilled physical therapy in hospital and recommended venue below to increase strength, balance, endurance for safe ADLs and gait.           If plan is discharge home, recommend the following: A lot of help with bathing/dressing/bathroom;A lot of help with walking and/or transfers;Help with stairs or ramp for entrance;Assistance with cooking/housework   Can travel by private vehicle   No    Equipment Recommendations None recommended by PT  Recommendations for Other Services       Functional Status Assessment Patient has had a recent decline in their functional status and demonstrates the ability to make significant improvements in function in a reasonable and predictable amount of time.     Precautions / Restrictions Precautions Precautions: Fall Restrictions Weight Bearing Restrictions: No      Mobility  Bed  Mobility Overal bed mobility: Needs Assistance Bed Mobility: Rolling, Sidelying to Sit Rolling: Mod assist Sidelying to sit: Mod assist, Max assist, HOB elevated       General bed mobility comments: fair/poor carryover for rolling to side and sitting up from side lying position due to increasing low back pain with guarding    Transfers Overall transfer level: Needs assistance Equipment used: Rolling walker (2 wheels) Transfers: Sit to/from Stand, Bed to chair/wheelchair/BSC Sit to Stand: Mod assist, Max assist   Step pivot transfers: Mod assist, Max assist       General transfer comment: increased time, labored movement    Ambulation/Gait Ambulation/Gait assistance: Mod assist, Max assist Gait Distance (Feet): 6 Feet Assistive device: Rolling walker (2 wheels) Gait Pattern/deviations: Decreased step length - right, Decreased step length - left, Decreased stride length, Knees buckling Gait velocity: slow     General Gait Details: limited to a few slow labored unsteady side steps with buckling of knees, c/o severe low back and sacral pain  Stairs            Wheelchair Mobility     Tilt Bed    Modified Rankin (Stroke Patients Only)       Balance Overall balance assessment: Needs assistance Sitting-balance support: Feet supported, No upper extremity supported Sitting balance-Leahy Scale: Poor Sitting balance - Comments: fair/good seated at EOB   Standing balance support: Reliant on assistive device for balance, During functional activity, Bilateral upper extremity supported Standing balance-Leahy Scale: Poor Standing balance comment: using RW  Pertinent Vitals/Pain Pain Assessment Pain Assessment: Faces Faces Pain Scale: Hurts even more Pain Location: back Pain Descriptors / Indicators: Grimacing, Sharp, Discomfort, Guarding Pain Intervention(s): Limited activity within patient's tolerance, Monitored during  session, Premedicated before session, Repositioned    Home Living Family/patient expects to be discharged to:: Private residence Living Arrangements: Alone Available Help at Discharge: Family;Available PRN/intermittently Type of Home: House Home Access: Ramped entrance       Home Layout: One level Home Equipment: Agricultural consultant (2 wheels);Cane - single point;Wheelchair Financial trader (4 wheels);Shower seat;Lift chair;Transport chair      Prior Function Prior Level of Function : Needs assist       Physical Assist : Mobility (physical);ADLs (physical) Mobility (physical): Bed mobility;Transfers;Gait;Stairs   Mobility Comments: Ambulates within the home without AD; RW used in community. Assisted for toilet transfer. ADLs Comments: Pt reports assist for toilet transfer; wears depends. Reports independence with bathing and dressing. IADL assist via a cleaning person.     Extremity/Trunk Assessment   Upper Extremity Assessment Upper Extremity Assessment: Defer to OT evaluation    Lower Extremity Assessment Lower Extremity Assessment: Generalized weakness    Cervical / Trunk Assessment Cervical / Trunk Assessment: Kyphotic  Communication   Communication Communication: No apparent difficulties  Cognition Arousal: Alert Behavior During Therapy: WFL for tasks assessed/performed Overall Cognitive Status: Within Functional Limits for tasks assessed                                          General Comments      Exercises     Assessment/Plan    PT Assessment Patient needs continued PT services  PT Problem List Decreased strength;Decreased balance;Decreased activity tolerance;Decreased mobility       PT Treatment Interventions DME instruction;Gait training;Stair training;Functional mobility training;Therapeutic activities;Therapeutic exercise;Balance training;Patient/family education    PT Goals (Current goals can be found in the Care Plan section)   Acute Rehab PT Goals Patient Stated Goal: return home after rehab PT Goal Formulation: With patient Time For Goal Achievement: 11/24/22 Potential to Achieve Goals: Good    Frequency Min 3X/week     Co-evaluation PT/OT/SLP Co-Evaluation/Treatment: Yes Reason for Co-Treatment: To address functional/ADL transfers PT goals addressed during session: Mobility/safety with mobility;Balance;Proper use of DME         AM-PAC PT "6 Clicks" Mobility  Outcome Measure Help needed turning from your back to your side while in a flat bed without using bedrails?: A Lot Help needed moving from lying on your back to sitting on the side of a flat bed without using bedrails?: A Lot Help needed moving to and from a bed to a chair (including a wheelchair)?: A Lot Help needed standing up from a chair using your arms (e.g., wheelchair or bedside chair)?: A Lot Help needed to walk in hospital room?: A Lot Help needed climbing 3-5 steps with a railing? : Total 6 Click Score: 11    End of Session   Activity Tolerance: Patient tolerated treatment well;Patient limited by fatigue Patient left: in chair;with call bell/phone within reach Nurse Communication: Mobility status PT Visit Diagnosis: Unsteadiness on feet (R26.81);Other abnormalities of gait and mobility (R26.89);Muscle weakness (generalized) (M62.81)    Time: 3086-5784 PT Time Calculation (min) (ACUTE ONLY): 18 min   Charges:   PT Evaluation $PT Eval Low Complexity: 1 Low PT Treatments $Therapeutic Activity: 8-22 mins PT General Charges $$ ACUTE PT VISIT:  1 Visit         1:55 PM, 11/10/22 Ocie Bob, MPT Physical Therapist with [facility] [phone] office [phone] mobile phone

## 2022-11-10 NOTE — Plan of Care (Signed)
  Problem: Acute Rehab PT Goals(only PT should resolve) Goal: Pt Will Go Supine/Side To Sit Outcome: Progressing Flowsheets (Taken 11/10/2022 1357) Pt will go Supine/Side to Sit:  with moderate assist  with minimal assist Goal: Patient Will Transfer Sit To/From Stand Outcome: Progressing Flowsheets (Taken 11/10/2022 1357) Patient will transfer sit to/from stand:  with minimal assist  with moderate assist Goal: Pt Will Transfer Bed To Chair/Chair To Bed Outcome: Progressing Flowsheets (Taken 11/10/2022 1357) Pt will Transfer Bed to Chair/Chair to Bed:  with min assist  with mod assist Goal: Pt Will Ambulate Outcome: Progressing Flowsheets (Taken 11/10/2022 1357) Pt will Ambulate:  25 feet  with moderate assist  with rolling walker   1:58 PM, 11/10/22 Ocie Bob, MPT Physical Therapist with [facility] [phone] office [phone] mobile phone

## 2022-11-10 NOTE — Plan of Care (Signed)
  Problem: Acute Rehab OT Goals (only OT should resolve) Goal: Pt. Will Perform Grooming Flowsheets (Taken 11/10/2022 0925) Pt Will Perform Grooming:  with modified independence  sitting Goal: Pt. Will Perform Lower Body Bathing Flowsheets (Taken 11/10/2022 0925) Pt Will Perform Lower Body Bathing:  with min assist  sitting/lateral leans Goal: Pt. Will Perform Upper Body Dressing Flowsheets (Taken 11/10/2022 0925) Pt Will Perform Upper Body Dressing:  with modified independence  sitting Goal: Pt. Will Perform Lower Body Dressing Flowsheets (Taken 11/10/2022 0925) Pt Will Perform Lower Body Dressing:  with min assist  sitting/lateral leans Goal: Pt. Will Transfer To Toilet Flowsheets (Taken 11/10/2022 [phone]) Pt Will Transfer to Toilet:  with contact guard assist  stand pivot transfer Goal: Pt. Will Perform Toileting-Clothing Manipulation Flowsheets (Taken 11/10/2022 0925) Pt Will Perform Toileting - Clothing Manipulation and hygiene:  with contact guard assist  sitting/lateral leans Goal: Pt/Caregiver Will Perform Home Exercise Program Flowsheets (Taken 11/10/2022 [phone]) Pt/caregiver will Perform Home Exercise Program:  Increased strength  Both right and left upper extremity  Independently  Mercia Dowe OT, MOT

## 2022-11-10 NOTE — Progress Notes (Signed)
PROGRESS NOTE    Heidi Fuentes  WUJ:[id number] DOB: [birth year] DOA: 11/09/2022 PCP: Tanna Furry, MD   Brief Narrative:    Heidi Fuentes is a 77 y.o. female with medical history significant of hypertension, asthma, bipolar disorder who presents to the emergency department due to sustaining a fall about 2 days ago whereby she lost her balance while getting into a bed and she landed on her bottom and accidentally hit her head in the process.  She developed swelling of her scalp which has since resolved, but now complaining of right hip pain and right lower back with difficulty of bearing weight on her legs.  Patient was admitted with noted compression fracture to L2 as well as S1 and S2 and PT recommending SNF/rehab.  She is also noted to have aspiration pneumonia and has been started on azithromycin and Unasyn and continues to have ongoing cough and chest congestion.  Assessment & Plan:   Principal Problem:   Aspiration pneumonia (HCC) Active Problems:   Acquired hypothyroidism   Asthma, chronic   Gastroesophageal reflux disease without esophagitis   Essential hypertension   Compression fracture of pelvis (HCC)  Assessment and Plan:   Aspiration pneumonia CT chest, abdomen and pelvis was suggestive of aspiration/multiple focal infection Patient was started on ceftriaxone and azithromycin, we shall continue with azithromycin and Unasyn at this time with plan to de-escalate/discontinue based on blood culture, sputum culture, urine Legionella, strep pneumo and procalcitonin Continue Tylenol as needed Continue Mucinex, incentive spirometry, flutter valve    Compression fracture CT thoracic and lumbar spine was suggestive of endplate deformity of L2 and anterior cortical fracture of the S1 and S2 sacral segments Continue Tylenol as needed Continue Percocet as needed for moderate and severe pain TLSO brace was provided PT/OT recommending SNF/rehab   Essential  hypertension Continue Lopressor and losartan   Acquired hypothyroidism Continue Synthroid   Asthma Continue Breztri   GERD Continue Protonix    DVT prophylaxis:Lovenox Code Status: Full Family Communication: None at bedside Disposition Plan: Anticipate discharge to SNF Status is: Inpatient Remains inpatient appropriate because: Need for IV medications.   Consultants:  None  Procedures:  None  Antimicrobials:  Anti-infectives (From admission, onward)    Start     Dose/Rate Route Frequency Ordered Stop   11/10/22 2100  azithromycin (ZITHROMAX) 500 mg in sodium chloride 0.9 % 250 mL IVPB        500 mg 250 mL/hr over 60 Minutes Intravenous Every 24 hours 11/10/22 0049 11/15/22 2059   11/10/22 0800  Ampicillin-Sulbactam (UNASYN) 3 g in sodium chloride 0.9 % 100 mL IVPB        3 g 200 mL/hr over 30 Minutes Intravenous Every 6 hours 11/10/22 0056     11/09/22 2015  cefTRIAXone (ROCEPHIN) 1 g in sodium chloride 0.9 % 100 mL IVPB        1 g 200 mL/hr over 30 Minutes Intravenous  Once 11/09/22 2006 11/09/22 2057   11/09/22 2015  azithromycin (ZITHROMAX) 500 mg in sodium chloride 0.9 % 250 mL IVPB        500 mg 250 mL/hr over 60 Minutes Intravenous  Once 11/09/22 2006 11/09/22 2304      Subjective: Patient seen and evaluated today with ongoing cough and low back pain. No acute concerns or events noted overnight.  Objective: Vitals:   11/10/22 0114 11/10/22 0200 11/10/22 0220 11/10/22 0518  BP:  (!) 156/70 (!) 158/68 132/66  Pulse:  87 82 78  Resp:  12 16 16   Temp: 98.1 F (36.7 C)  97.8 F (36.6 C) 98.2 F (36.8 C)  TempSrc: Oral  Oral Oral  SpO2:  97% 95% 96%  Weight:      Height:        Intake/Output Summary (Last 24 hours) at 11/10/2022 2355 Last data filed at 11/09/2022 2304 Gross per 24 hour  Intake 350 ml  Output --  Net 350 ml   Filed Weights   11/09/22 1238  Weight: 56.2 kg    Examination:  General exam: Appears calm and comfortable   Respiratory system: Congestion/wheezing. Respiratory effort normal. Cardiovascular system: S1 & S2 heard, RRR.  Gastrointestinal system: Abdomen is soft Central nervous system: Alert and awake Extremities: No edema Skin: No significant lesions noted Psychiatry: Flat affect.    Data Reviewed: I have personally reviewed following labs and imaging studies  CBC: Recent Labs  Lab 11/09/22 1322 11/10/22 0113  WBC 12.3* 10.3  NEUTROABS 9.3*  --   HGB 12.2 10.6*  HCT 39.1 32.7*  MCV 99.5 97.9  PLT 215 188   Basic Metabolic Panel: Recent Labs  Lab 11/09/22 1322 11/10/22 0113  NA 136 135  K 3.9 3.3*  CL 100 100  CO2 26 24  GLUCOSE 103* 79  BUN 16 14  CREATININE 0.67 0.69  CALCIUM 9.1 8.5*  MG  --  2.0  PHOS  --  3.1   GFR: Estimated Creatinine Clearance: 46.3 mL/min (by C-G formula based on SCr of 0.69 mg/dL). Liver Function Tests: Recent Labs  Lab 11/10/22 0113  AST 22  ALT 18  ALKPHOS 54  BILITOT 0.8  PROT 6.0*  ALBUMIN 3.4*   No results for input(s): "LIPASE", "AMYLASE" in the last 168 hours. No results for input(s): "AMMONIA" in the last 168 hours. Coagulation Profile: No results for input(s): "INR", "PROTIME" in the last 168 hours. Cardiac Enzymes: No results for input(s): "CKTOTAL", "CKMB", "CKMBINDEX", "TROPONINI" in the last 168 hours. BNP (last 3 results) No results for input(s): "PROBNP" in the last 8760 hours. HbA1C: No results for input(s): "HGBA1C" in the last 72 hours. CBG: No results for input(s): "GLUCAP" in the last 168 hours. Lipid Profile: No results for input(s): "CHOL", "HDL", "LDLCALC", "TRIG", "CHOLHDL", "LDLDIRECT" in the last 72 hours. Thyroid Function Tests: No results for input(s): "TSH", "T4TOTAL", "FREET4", "T3FREE", "THYROIDAB" in the last 72 hours. Anemia Panel: No results for input(s): "VITAMINB12", "FOLATE", "FERRITIN", "TIBC", "IRON", "RETICCTPCT" in the last 72 hours. Sepsis Labs: Recent Labs  Lab 11/10/22 0113   PROCALCITON <0.10    Recent Results (from the past 240 hour(s))  Resp panel by RT-PCR (RSV, Flu A&B, Covid) Anterior Nasal Swab     Status: None   Collection Time: 11/09/22  8:14 PM   Specimen: Anterior Nasal Swab  Result Value Ref Range Status   SARS Coronavirus 2 by RT PCR NEGATIVE NEGATIVE Final    Comment: (NOTE) SARS-CoV-2 target nucleic acids are NOT DETECTED.  The SARS-CoV-2 RNA is generally detectable in upper respiratory specimens during the acute phase of infection. The lowest concentration of SARS-CoV-2 viral copies this assay can detect is 138 copies/mL. A negative result does not preclude SARS-Cov-2 infection and should not be used as the sole basis for treatment or other patient management decisions. A negative result may occur with  improper specimen collection/handling, submission of specimen other than nasopharyngeal swab, presence of viral mutation(s) within the areas targeted by this assay, and inadequate number of viral copies(<138 copies/mL). A  negative result must be combined with clinical observations, patient history, and epidemiological information. The expected result is Negative.  Fact Sheet for Patients:  BloggerCourse.com  Fact Sheet for Healthcare Providers:  SeriousBroker.it  This test is no t yet approved or cleared by the [place] FDA and  has been authorized for detection and/or diagnosis of SARS-CoV-2 by FDA under an Emergency Use Authorization (EUA). This EUA will remain  in effect (meaning this test can be used) for the duration of the COVID-19 declaration under Section 564(b)(1) of the Act, 21 U.S.C.section 360bbb-3(b)(1), unless the authorization is terminated  or revoked sooner.       Influenza A by PCR NEGATIVE NEGATIVE Final   Influenza B by PCR NEGATIVE NEGATIVE Final    Comment: (NOTE) The Xpert Xpress SARS-CoV-2/FLU/RSV plus assay is intended as an aid in the diagnosis of  influenza from Nasopharyngeal swab specimens and should not be used as a sole basis for treatment. Nasal washings and aspirates are unacceptable for Xpert Xpress SARS-CoV-2/FLU/RSV testing.  Fact Sheet for Patients: BloggerCourse.com  Fact Sheet for Healthcare Providers: SeriousBroker.it  This test is not yet approved or cleared by the [place] FDA and has been authorized for detection and/or diagnosis of SARS-CoV-2 by FDA under an Emergency Use Authorization (EUA). This EUA will remain in effect (meaning this test can be used) for the duration of the COVID-19 declaration under Section 564(b)(1) of the Act, 21 U.S.C. section 360bbb-3(b)(1), unless the authorization is terminated or revoked.     Resp Syncytial Virus by PCR NEGATIVE NEGATIVE Final    Comment: (NOTE) Fact Sheet for Patients: BloggerCourse.com  Fact Sheet for Healthcare Providers: SeriousBroker.it  This test is not yet approved or cleared by the [place] FDA and has been authorized for detection and/or diagnosis of SARS-CoV-2 by FDA under an Emergency Use Authorization (EUA). This EUA will remain in effect (meaning this test can be used) for the duration of the COVID-19 declaration under Section 564(b)(1) of the Act, 21 U.S.C. section 360bbb-3(b)(1), unless the authorization is terminated or revoked.  Performed at [facility], [address] [place], [place] [address]   Culture, blood (routine x 2) Call MD if unable to obtain prior to antibiotics being given     Status: None (Preliminary result)   Collection Time: 11/10/22  1:15 AM   Specimen: BLOOD RIGHT FOREARM  Result Value Ref Range Status   Specimen Description BLOOD RIGHT FOREARM  Final   Special Requests   Final    BOTTLES DRAWN AEROBIC AND ANAEROBIC Blood Culture adequate volume Performed at [facility], [address], [place], [place]  [address]    Culture PENDING  Incomplete   Report Status PENDING  Incomplete  Culture, blood (routine x 2) Call MD if unable to obtain prior to antibiotics being given     Status: None (Preliminary result)   Collection Time: 11/10/22  1:15 AM   Specimen: Right Antecubital; Blood  Result Value Ref Range Status   Specimen Description RIGHT ANTECUBITAL  Final   Special Requests   Final    BOTTLES DRAWN AEROBIC AND ANAEROBIC Blood Culture results may not be optimal due to an excessive volume of blood received in culture bottles Performed at [facility], [address], [place], [place] [address]    Culture PENDING  Incomplete   Report Status PENDING  Incomplete         Radiology Studies: CT L-SPINE NO CHARGE  Result Date: 11/09/2022 CLINICAL DATA:  Trip and fall  2 nights ago, severe right hip pain EXAM: CT CHEST, ABDOMEN, AND PELVIS WITH CONTRAST CT THORACIC AND LUMBAR SPINE WITH CONTRAST TECHNIQUE: Multidetector CT imaging of the chest, abdomen and pelvis was performed following the standard protocol during bolus administration of intravenous contrast. Multidetector CT imaging of the thoracic and lumbar spine was performed following the standard protocol during bolus administration of intravenous contrast. RADIATION DOSE REDUCTION: This exam was performed according to the departmental dose-optimization program which includes automated exposure control, adjustment of the mA and/or kV according to patient size and/or use of iterative reconstruction technique. CONTRAST:  75mL OMNIPAQUE IOHEXOL 300 MG/ML  SOLN COMPARISON:  CT chest, 07/27/2019 CT abdomen pelvis, 07/09/2013 FINDINGS: CT CHEST FINDINGS Cardiovascular: Aortic atherosclerosis. Normal heart size. Left coronary artery calcifications. No pericardial effusion. Mediastinum/Nodes: No enlarged mediastinal, hilar, or axillary lymph nodes. Thyroid gland, trachea, and esophagus demonstrate no significant findings. Lungs/Pleura: Multifocal  heterogeneous airspace opacity and consolidation throughout the lungs, most conspicuously in the right middle lobe (series 9, image 95) and superior segment right lower lobe (series 9, image 83). No pleural effusion or pneumothorax. Musculoskeletal: No chest wall mass or suspicious osseous lesions identified. CT ABDOMEN PELVIS FINDINGS Hepatobiliary: No solid liver abnormality is seen. No gallstones, gallbladder wall thickening, or biliary dilatation. Pancreas: Fatty atrophy of the pancreatic parenchyma. No pancreatic ductal dilatation or surrounding inflammatory changes. Spleen: Normal in size without significant abnormality. Adrenals/Urinary Tract: Adrenal glands are unremarkable. Kidneys are normal, without renal calculi, solid lesion, or hydronephrosis. Bladder is unremarkable. Stomach/Bowel: Evidence of prior hiatal hernia repair. Stomach is otherwise within normal limits. Appendix not clearly visualized and may be surgically absent. No evidence of bowel wall thickening, distention, or inflammatory changes. Vascular/Lymphatic: Aortic atherosclerosis. No enlarged abdominal or pelvic lymph nodes. Reproductive: No mass or other abnormality. Other: Small fat containing umbilical hernia.  No ascites. Musculoskeletal: No acute osseous findings. CT THORACIC AND LUMBAR SPINE FINDINGS Alignment: Normal thoracic kyphosis. Minimal degenerative anterolisthesis of L4 on L5, with otherwise normal lumbar lordosis. Vertebral bodies: Mild, acute appearing superior endplate deformity of L2 without significant anterior height loss (series 5, image 33). Mild superior endplate deformity and buckled anterior cortical fracture of the S1 and S2 sacral segments (series 5, image 34). Minimal, nonacute wedging of the T11 vertebral body. Disc spaces: Intact. Paraspinous soft tissues: Unremarkable. IMPRESSION: 1. Mild, acute appearing superior endplate deformity of L2 without significant anterior height loss. 2. Mild superior endplate  deformity and buckled anterior cortical fracture of the S1 and S2 sacral segments. 3. No evidence of acute traumatic injury to the organs of the chest, abdomen, or pelvis. 4. No evidence of displaced right hip fracture per report of pain and inability to bear weight. 5. Multifocal heterogeneous airspace opacity and consolidation throughout the lungs, most conspicuously in the right middle lobe and superior segment right lower lobe. Findings are most consistent with multifocal infection or aspiration. 6. Coronary artery disease. Aortic Atherosclerosis (ICD10-I70.0). Electronically Signed   By: Jearld Lesch M.D.   On: 11/09/2022 19:50   CT CHEST ABDOMEN PELVIS W CONTRAST  Result Date: 11/09/2022 CLINICAL DATA:  Trip and fall 2 nights ago, severe right hip pain EXAM: CT CHEST, ABDOMEN, AND PELVIS WITH CONTRAST CT THORACIC AND LUMBAR SPINE WITH CONTRAST TECHNIQUE: Multidetector CT imaging of the chest, abdomen and pelvis was performed following the standard protocol during bolus administration of intravenous contrast. Multidetector CT imaging of the thoracic and lumbar spine was performed following the standard protocol during bolus administration of intravenous contrast. RADIATION  DOSE REDUCTION: This exam was performed according to the departmental dose-optimization program which includes automated exposure control, adjustment of the mA and/or kV according to patient size and/or use of iterative reconstruction technique. CONTRAST:  75mL OMNIPAQUE IOHEXOL 300 MG/ML  SOLN COMPARISON:  CT chest, 07/27/2019 CT abdomen pelvis, 07/09/2013 FINDINGS: CT CHEST FINDINGS Cardiovascular: Aortic atherosclerosis. Normal heart size. Left coronary artery calcifications. No pericardial effusion. Mediastinum/Nodes: No enlarged mediastinal, hilar, or axillary lymph nodes. Thyroid gland, trachea, and esophagus demonstrate no significant findings. Lungs/Pleura: Multifocal heterogeneous airspace opacity and consolidation throughout  the lungs, most conspicuously in the right middle lobe (series 9, image 95) and superior segment right lower lobe (series 9, image 83). No pleural effusion or pneumothorax. Musculoskeletal: No chest wall mass or suspicious osseous lesions identified. CT ABDOMEN PELVIS FINDINGS Hepatobiliary: No solid liver abnormality is seen. No gallstones, gallbladder wall thickening, or biliary dilatation. Pancreas: Fatty atrophy of the pancreatic parenchyma. No pancreatic ductal dilatation or surrounding inflammatory changes. Spleen: Normal in size without significant abnormality. Adrenals/Urinary Tract: Adrenal glands are unremarkable. Kidneys are normal, without renal calculi, solid lesion, or hydronephrosis. Bladder is unremarkable. Stomach/Bowel: Evidence of prior hiatal hernia repair. Stomach is otherwise within normal limits. Appendix not clearly visualized and may be surgically absent. No evidence of bowel wall thickening, distention, or inflammatory changes. Vascular/Lymphatic: Aortic atherosclerosis. No enlarged abdominal or pelvic lymph nodes. Reproductive: No mass or other abnormality. Other: Small fat containing umbilical hernia.  No ascites. Musculoskeletal: No acute osseous findings. CT THORACIC AND LUMBAR SPINE FINDINGS Alignment: Normal thoracic kyphosis. Minimal degenerative anterolisthesis of L4 on L5, with otherwise normal lumbar lordosis. Vertebral bodies: Mild, acute appearing superior endplate deformity of L2 without significant anterior height loss (series 5, image 33). Mild superior endplate deformity and buckled anterior cortical fracture of the S1 and S2 sacral segments (series 5, image 34). Minimal, nonacute wedging of the T11 vertebral body. Disc spaces: Intact. Paraspinous soft tissues: Unremarkable. IMPRESSION: 1. Mild, acute appearing superior endplate deformity of L2 without significant anterior height loss. 2. Mild superior endplate deformity and buckled anterior cortical fracture of the S1 and  S2 sacral segments. 3. No evidence of acute traumatic injury to the organs of the chest, abdomen, or pelvis. 4. No evidence of displaced right hip fracture per report of pain and inability to bear weight. 5. Multifocal heterogeneous airspace opacity and consolidation throughout the lungs, most conspicuously in the right middle lobe and superior segment right lower lobe. Findings are most consistent with multifocal infection or aspiration. 6. Coronary artery disease. Aortic Atherosclerosis (ICD10-I70.0). Electronically Signed   By: Jearld Lesch M.D.   On: 11/09/2022 19:50   CT T-SPINE NO CHARGE  Result Date: 11/09/2022 CLINICAL DATA:  Trip and fall 2 nights ago, severe right hip pain EXAM: CT CHEST, ABDOMEN, AND PELVIS WITH CONTRAST CT THORACIC AND LUMBAR SPINE WITH CONTRAST TECHNIQUE: Multidetector CT imaging of the chest, abdomen and pelvis was performed following the standard protocol during bolus administration of intravenous contrast. Multidetector CT imaging of the thoracic and lumbar spine was performed following the standard protocol during bolus administration of intravenous contrast. RADIATION DOSE REDUCTION: This exam was performed according to the departmental dose-optimization program which includes automated exposure control, adjustment of the mA and/or kV according to patient size and/or use of iterative reconstruction technique. CONTRAST:  75mL OMNIPAQUE IOHEXOL 300 MG/ML  SOLN COMPARISON:  CT chest, 07/27/2019 CT abdomen pelvis, 07/09/2013 FINDINGS: CT CHEST FINDINGS Cardiovascular: Aortic atherosclerosis. Normal heart size. Left coronary artery calcifications. No pericardial effusion.  Mediastinum/Nodes: No enlarged mediastinal, hilar, or axillary lymph nodes. Thyroid gland, trachea, and esophagus demonstrate no significant findings. Lungs/Pleura: Multifocal heterogeneous airspace opacity and consolidation throughout the lungs, most conspicuously in the right middle lobe (series 9, image 95) and  superior segment right lower lobe (series 9, image 83). No pleural effusion or pneumothorax. Musculoskeletal: No chest wall mass or suspicious osseous lesions identified. CT ABDOMEN PELVIS FINDINGS Hepatobiliary: No solid liver abnormality is seen. No gallstones, gallbladder wall thickening, or biliary dilatation. Pancreas: Fatty atrophy of the pancreatic parenchyma. No pancreatic ductal dilatation or surrounding inflammatory changes. Spleen: Normal in size without significant abnormality. Adrenals/Urinary Tract: Adrenal glands are unremarkable. Kidneys are normal, without renal calculi, solid lesion, or hydronephrosis. Bladder is unremarkable. Stomach/Bowel: Evidence of prior hiatal hernia repair. Stomach is otherwise within normal limits. Appendix not clearly visualized and may be surgically absent. No evidence of bowel wall thickening, distention, or inflammatory changes. Vascular/Lymphatic: Aortic atherosclerosis. No enlarged abdominal or pelvic lymph nodes. Reproductive: No mass or other abnormality. Other: Small fat containing umbilical hernia.  No ascites. Musculoskeletal: No acute osseous findings. CT THORACIC AND LUMBAR SPINE FINDINGS Alignment: Normal thoracic kyphosis. Minimal degenerative anterolisthesis of L4 on L5, with otherwise normal lumbar lordosis. Vertebral bodies: Mild, acute appearing superior endplate deformity of L2 without significant anterior height loss (series 5, image 33). Mild superior endplate deformity and buckled anterior cortical fracture of the S1 and S2 sacral segments (series 5, image 34). Minimal, nonacute wedging of the T11 vertebral body. Disc spaces: Intact. Paraspinous soft tissues: Unremarkable. IMPRESSION: 1. Mild, acute appearing superior endplate deformity of L2 without significant anterior height loss. 2. Mild superior endplate deformity and buckled anterior cortical fracture of the S1 and S2 sacral segments. 3. No evidence of acute traumatic injury to the organs of the  chest, abdomen, or pelvis. 4. No evidence of displaced right hip fracture per report of pain and inability to bear weight. 5. Multifocal heterogeneous airspace opacity and consolidation throughout the lungs, most conspicuously in the right middle lobe and superior segment right lower lobe. Findings are most consistent with multifocal infection or aspiration. 6. Coronary artery disease. Aortic Atherosclerosis (ICD10-I70.0). Electronically Signed   By: Jearld Lesch M.D.   On: 11/09/2022 19:50   CT Head Wo Contrast  Result Date: 11/09/2022 CLINICAL DATA:  Trip and fall 2 nights ago pain EXAM: CT HEAD WITHOUT CONTRAST CT CERVICAL SPINE WITHOUT CONTRAST TECHNIQUE: Multidetector CT imaging of the head and cervical spine was performed following the standard protocol without intravenous contrast. Multiplanar CT image reconstructions of the cervical spine were also generated. RADIATION DOSE REDUCTION: This exam was performed according to the departmental dose-optimization program which includes automated exposure control, adjustment of the mA and/or kV according to patient size and/or use of iterative reconstruction technique. COMPARISON:  07/15/2022 FINDINGS: CT HEAD FINDINGS Brain: No evidence of acute infarction, hemorrhage, hydrocephalus, extra-axial collection or mass lesion/mass effect. Periventricular white matter hypodensity. Vascular: No hyperdense vessel or unexpected calcification. Skull: Normal. Negative for fracture or focal lesion. Sinuses/Orbits: No acute finding. Other: None. CT CERVICAL SPINE FINDINGS Alignment: Normal. Skull base and vertebrae: No acute fracture. No primary bone lesion or focal pathologic process. Soft tissues and spinal canal: No prevertebral fluid or swelling. No visible canal hematoma. Disc levels: Focally moderate disc space height loss and osteophytosis of C5-C6 with otherwise mild disc degenerative change. Upper chest: Negative. Other: None. IMPRESSION: 1. No acute intracranial  pathology. 2. No fracture or static subluxation of the cervical spine. 3. Focally moderate  disc space height loss and osteophytosis of C5-C6 with otherwise mild disc degenerative change. Electronically Signed   By: Jearld Lesch M.D.   On: 11/09/2022 19:25   CT Cervical Spine Wo Contrast  Result Date: 11/09/2022 CLINICAL DATA:  Trip and fall 2 nights ago pain EXAM: CT HEAD WITHOUT CONTRAST CT CERVICAL SPINE WITHOUT CONTRAST TECHNIQUE: Multidetector CT imaging of the head and cervical spine was performed following the standard protocol without intravenous contrast. Multiplanar CT image reconstructions of the cervical spine were also generated. RADIATION DOSE REDUCTION: This exam was performed according to the departmental dose-optimization program which includes automated exposure control, adjustment of the mA and/or kV according to patient size and/or use of iterative reconstruction technique. COMPARISON:  07/15/2022 FINDINGS: CT HEAD FINDINGS Brain: No evidence of acute infarction, hemorrhage, hydrocephalus, extra-axial collection or mass lesion/mass effect. Periventricular white matter hypodensity. Vascular: No hyperdense vessel or unexpected calcification. Skull: Normal. Negative for fracture or focal lesion. Sinuses/Orbits: No acute finding. Other: None. CT CERVICAL SPINE FINDINGS Alignment: Normal. Skull base and vertebrae: No acute fracture. No primary bone lesion or focal pathologic process. Soft tissues and spinal canal: No prevertebral fluid or swelling. No visible canal hematoma. Disc levels: Focally moderate disc space height loss and osteophytosis of C5-C6 with otherwise mild disc degenerative change. Upper chest: Negative. Other: None. IMPRESSION: 1. No acute intracranial pathology. 2. No fracture or static subluxation of the cervical spine. 3. Focally moderate disc space height loss and osteophytosis of C5-C6 with otherwise mild disc degenerative change. Electronically Signed   By: Jearld Lesch  M.D.   On: 11/09/2022 19:25   DG Hip Unilat W or Wo Pelvis 2-3 Views Right  Result Date: 11/09/2022 CLINICAL DATA:  Right hip pain after falling. EXAM: DG HIP (WITH OR WITHOUT PELVIS) 2-3V RIGHT COMPARISON:  Pelvic radiographs 08/03/2020. Left hip MRI 12/15/2019. FINDINGS: The mineralization and alignment are normal. There is no evidence of acute fracture or dislocation. No evidence of femoral head osteonecrosis. Moderately advanced asymmetric left hip osteoarthritis again noted. There is minimal right hip joint space narrowing. The soft tissues appear unremarkable. IMPRESSION: No evidence of acute fracture or dislocation. Moderately advanced left hip osteoarthritis. Electronically Signed   By: Carey Bullocks M.D.   On: 11/09/2022 14:59        Scheduled Meds:  dextromethorphan-guaiFENesin  1 tablet Oral BID   enoxaparin (LOVENOX) injection  40 mg Subcutaneous Q24H   levothyroxine  50 mcg Oral Daily   lidocaine  1 patch Transdermal Q24H   losartan  25 mg Oral Daily   metoprolol tartrate  12.5 mg Oral BID   mometasone-formoterol  2 puff Inhalation BID   And   umeclidinium bromide  1 puff Inhalation Daily   pantoprazole  40 mg Oral Daily   potassium chloride  40 mEq Oral BID   Continuous Infusions:  ampicillin-sulbactam (UNASYN) IV     azithromycin       LOS: 1 day    Time spent: 35 minutes    Heidi Gauer Hoover Brunette, DO [facility]  If 7PM-7AM, please contact night-coverage [email or web address] 11/10/2022, 6:52 AM

## 2022-11-11 DIAGNOSIS — J690 Pneumonitis due to inhalation of food and vomit: Secondary | ICD-10-CM | POA: Diagnosis not present

## 2022-11-11 LAB — CBC
HCT: 35 % — ABNORMAL LOW (ref 36.0–46.0)
Hemoglobin: 11.1 g/dL — ABNORMAL LOW (ref 12.0–15.0)
MCH: 30.8 pg (ref 26.0–34.0)
MCHC: 31.7 g/dL (ref 30.0–36.0)
MCV: 97.2 fL (ref 80.0–100.0)
Platelets: 202 10*3/uL (ref 150–400)
RBC: 3.6 MIL/uL — ABNORMAL LOW (ref 3.87–5.11)
RDW: 13.7 % (ref 11.5–15.5)
WBC: 9.2 10*3/uL (ref 4.0–10.5)
nRBC: 0 % (ref 0.0–0.2)

## 2022-11-11 LAB — URINALYSIS, ROUTINE W REFLEX MICROSCOPIC
Bilirubin Urine: NEGATIVE
Glucose, UA: NEGATIVE mg/dL
Ketones, ur: 20 mg/dL — AB
Leukocytes,Ua: NEGATIVE
Nitrite: NEGATIVE
Protein, ur: 100 mg/dL — AB
Specific Gravity, Urine: 1.02 (ref 1.005–1.030)
pH: 6 (ref 5.0–8.0)

## 2022-11-11 LAB — MAGNESIUM: Magnesium: 2 mg/dL (ref 1.7–2.4)

## 2022-11-11 LAB — BASIC METABOLIC PANEL
Anion gap: 12 (ref 5–15)
BUN: 11 mg/dL (ref 8–23)
CO2: 24 mmol/L (ref 22–32)
Calcium: 8.3 mg/dL — ABNORMAL LOW (ref 8.9–10.3)
Chloride: 98 mmol/L (ref 98–111)
Creatinine, Ser: 0.67 mg/dL (ref 0.44–1.00)
GFR, Estimated: >60 mL/min (ref >60)
Glucose, Bld: 102 mg/dL — ABNORMAL HIGH (ref 70–99)
Potassium: 3.4 mmol/L — ABNORMAL LOW (ref 3.5–5.1)
Sodium: 134 mmol/L — ABNORMAL LOW (ref 135–145)

## 2022-11-11 LAB — STREP PNEUMONIAE URINARY ANTIGEN: Strep Pneumo Urinary Antigen: NEGATIVE

## 2022-11-11 MED ORDER — POTASSIUM CHLORIDE CRYS ER 20 MEQ PO TBCR
40.0000 meq | EXTENDED_RELEASE_TABLET | Freq: Once | ORAL | Status: AC
  Administered 2022-11-11: 40 meq via ORAL
  Filled 2022-11-11: qty 2

## 2022-11-11 MED ORDER — RISAQUAD PO CAPS
2.0000 | ORAL_CAPSULE | Freq: Three times a day (TID) | ORAL | Status: DC
  Administered 2022-11-11 – 2022-11-12 (×3): 2 via ORAL
  Filled 2022-11-11 (×4): qty 2

## 2022-11-11 MED ORDER — LOSARTAN POTASSIUM 50 MG PO TABS
50.0000 mg | ORAL_TABLET | Freq: Every day | ORAL | Status: DC
  Administered 2022-11-11 – 2022-11-12 (×2): 50 mg via ORAL
  Filled 2022-11-11 (×2): qty 1

## 2022-11-11 MED ORDER — LEVOTHYROXINE SODIUM 50 MCG PO TABS: 50.0000 ug | ORAL_TABLET | Freq: Every day | ORAL | 2 refills | Status: DC

## 2022-11-11 MED ORDER — METHYLPREDNISOLONE 4 MG PO TBPK: ORAL_TABLET | ORAL | 0 refills | Status: DC

## 2022-11-11 MED ORDER — LEVOFLOXACIN 750 MG PO TABS: 750.0000 mg | ORAL_TABLET | Freq: Every day | ORAL | 0 refills | Status: AC

## 2022-11-11 MED ORDER — PANTOPRAZOLE SODIUM 40 MG PO TBEC: 40.0000 mg | DELAYED_RELEASE_TABLET | Freq: Every day | ORAL | 0 refills | Status: DC

## 2022-11-11 MED ORDER — ALBUTEROL SULFATE (2.5 MG/3ML) 0.083% IN NEBU
2.5000 mg | INHALATION_SOLUTION | RESPIRATORY_TRACT | Status: DC | PRN
  Administered 2022-11-11 – 2022-11-12 (×3): 2.5 mg via RESPIRATORY_TRACT
  Filled 2022-11-11 (×3): qty 3

## 2022-11-11 MED ORDER — OXYCODONE HCL 10 MG PO TABS: 10.0000 mg | ORAL_TABLET | Freq: Three times a day (TID) | ORAL | 0 refills | Status: AC | PRN

## 2022-11-11 NOTE — Care Management Important Message (Signed)
Important Message  Patient Details  Name: Heidi Fuentes MRN: [id number] Date of Birth: 1945/06/13   Important Message Given:  N/A - LOS <3 / Initial given by admissions     Corey Harold 11/11/2022, 12:04 PM

## 2022-11-11 NOTE — TOC Progression Note (Signed)
Transition of Care [facility]) - Progression Note    Patient Details  Name: Heidi Fuentes MRN: [id number] Date of Birth: [birth year]  [facility] CM/SW Contact  Leitha Bleak, RN Phone Number: 11/11/2022, 12:59 PM  Clinical Narrative:   [name] updated TOC that patient has no SNF bed days left. CM spoke with her son, he was offering to private pay.  CM called to get the daily rate. Per [name] it is $351 a day and you have to pay 2 months upfront.  [name], son said she does not have that kind of money.  She states her sitter was crying because of the way the patient treated them. He is unsure if they will come back.  MD holding discharge. Son will talk with family to make a plan to pick her up tomorrow to discharge home with home health.     Expected Discharge Plan: Home w Home Health Services Barriers to Discharge: Continued Medical Work up  Expected Discharge Plan and Services       Living arrangements for the past 2 months: Single Family Home Expected Discharge Date: 11/11/22               DME Arranged: [facility] [place] ([facility]) DME Agency: Other - Comment ([facility] [employer]) Date DME Agency Contacted: 11/10/22 Time DME Agency Contacted: [id number] Representative spoke with at DME Agency: called [phone]     Social Determinants of Health (SDOH) Interventions SDOH Screenings   Food Insecurity: No Food Insecurity (11/10/2022)  Housing: Low Risk  (11/10/2022)  Transportation Needs: No Transportation Needs (11/10/2022)  Utilities: Not At Risk (11/10/2022)  Tobacco Use: Low Risk  (11/10/2022)    Readmission Risk Interventions    11/10/2022   11:05 AM  Readmission Risk Prevention Plan  Transportation Screening Complete  Medication Review (RN Care Manager) Complete  PCP or Specialist appointment within 3-5 days of discharge Not Complete  HRI or Home Care Consult Complete  SW Recovery Care/Counseling Consult Complete  Palliative Care Screening Not Applicable  Skilled  Nursing Facility Not Complete

## 2022-11-11 NOTE — Discharge Summary (Signed)
Physician Discharge Summary   Patient: Heidi Fuentes MRN: [id number] DOB: 03/28/1945  Admit date:     11/09/2022  Discharge date: 11/11/22  Discharge Physician: Kendell Bane   PCP: Tanna Furry, MD   Recommendations at discharge:  Follow with orthopedic/spine in 1-2 weeks -Aggressive PT OT, fall precautions -Follow-up with the PCP in 1-2 weeks -Follow-up with a pulmonologist in 2-4 weeks  Discharge Diagnoses: Principal Problem:   Aspiration pneumonia (HCC) Active Problems:   Acquired hypothyroidism   Asthma, chronic   Gastroesophageal reflux disease without esophagitis   Essential hypertension   Compression fracture of pelvis (HCC)  Resolved Problems:   * No resolved hospital problems. *   Heidi Fuentes is a 77 y.o. female with medical history significant of hypertension, asthma, bipolar disorder who presents to the emergency department due to sustaining a fall about 2 days ago whereby she lost her balance while getting into a bed and she landed on her bottom and accidentally hit her head in the process.  She developed swelling of her scalp which has since resolved, but now complaining of right hip pain and right lower back with difficulty of bearing weight on her legs.  Patient was admitted with noted compression fracture to L2 as well as S1 and S2 and PT recommending SNF/rehab.  She is also noted to have aspiration pneumonia and has been started on azithromycin and Unasyn and continues to have ongoing cough and chest congestion.      Aspiration pneumonia CT chest, abdomen and pelvis was suggestive of aspiration/multiple focal infection Patient was started on ceftriaxone and azithromycin, was switched to azithromycin and Unasyn---- plan to de-escalate/discontinue >>> p.o. Levaquin -Cultures remain negative to date  Continue Tylenol as needed Continue Mucinex, incentive spirometry, flutter valve  -Taper prednisone   Compression fracture CT thoracic and lumbar  spine was suggestive of endplate deformity of L2 and anterior cortical fracture of the S1 and S2 sacral segments Continue Tylenol as needed Continue p.o. analgesics oxycodone, muscle relaxants as needed  TLSO brace was provided PT/OT recommending SNF/rehab >>>> Per TOC patient unfortunately used all 4 days,, after returning home with home PT OT which has been arranged   Essential hypertension Continue Lopressor and losartan   Acquired hypothyroidism Continue Synthroid   Asthma Continue Breztri   GERD Continue Protonix     Disposition: Home health Diet recommendation:  Discharge Diet Orders (From admission, onward)     Start     Ordered   11/11/22 0000  Diet - low sodium heart healthy        11/11/22 1201           Cardiac diet DISCHARGE MEDICATION: Allergies as of 11/11/2022       Reactions   Sulfa Antibiotics Rash, Hives   Mirtazapine Other (See Comments)   HA's   Trazodone Other (See Comments)   Trazodone Hcl Other (See Comments)   strange dreams   Ziprasidone Other (See Comments)   cardiac issues   Aripiprazole Other (See Comments)   Bad dreams   Quetiapine Palpitations   Trazodone And Nefazodone Other (See Comments)   unknown   Ziprasidone Hcl Other (See Comments)   hospitalization specifics        Medication List     TAKE these medications    acetaminophen 500 MG tablet Commonly known as: TYLENOL Take 500 mg by mouth every 6 (six) hours as needed for mild pain or moderate pain. For pain   buPROPion 75 MG  tablet Commonly known as: WELLBUTRIN Take 1 tablet (75 mg total) by mouth 2 (two) times daily.   diphenhydrAMINE 25 mg capsule Commonly known as: BENADRYL Take 25 mg by mouth every 6 (six) hours as needed for allergies.   FLUoxetine 40 MG capsule Commonly known as: PROZAC Take 40 mg by mouth daily.   fluticasone 50 MCG/ACT nasal spray Commonly known as: FLONASE Place 2 sprays into both nostrils daily.   gabapentin 300 MG  capsule Commonly known as: NEURONTIN Take 300 mg by mouth 2 (two) times daily.   ketoconazole 2 % cream Commonly known as: NIZORAL Apply 1 Application topically daily as needed for irritation.   lamoTRIgine 200 MG tablet Commonly known as: LAMICTAL Take 200 mg by mouth daily.   levofloxacin 750 MG tablet Commonly known as: Levaquin Take 1 tablet (750 mg total) by mouth daily for 5 days.   levothyroxine 50 MCG tablet Commonly known as: SYNTHROID Take 1 tablet (50 mcg total) by mouth daily. Start taking on: November 12, 2022   losartan 25 MG tablet Commonly known as: COZAAR Take 1 tablet (25 mg total) by mouth daily.   melatonin 5 MG Tabs Take 5 mg by mouth at bedtime.   methylPREDNISolone 4 MG Tbpk tablet Commonly known as: MEDROL DOSEPAK Medrol Dosepak take as instructed   metoprolol tartrate 25 MG tablet Commonly known as: LOPRESSOR Take 12.5 mg by mouth 2 (two) times daily.   montelukast 10 MG tablet Commonly known as: SINGULAIR Take 10 mg by mouth daily.   Oxycodone HCl 10 MG Tabs Take 1 tablet (10 mg total) by mouth every 8 (eight) hours as needed for up to 5 days (severe pain).   pantoprazole 40 MG tablet Commonly known as: Protonix Take 1 tablet (40 mg total) by mouth daily. Start taking on: November 12, 2022 What changed: additional instructions   Symbicort 160-4.5 MCG/ACT inhaler Generic drug: budesonide-formoterol Inhale 2 puffs into the lungs in the morning and at bedtime.        Discharge Exam: Filed Weights   11/09/22 1238  Weight: 56.2 kg        General:  AAO x 3,  cooperative, no distress;   HEENT:  Normocephalic, PERRL, otherwise with in Normal limits   Neuro:  CNII-XII intact. , normal motor and sensation, reflexes intact   Lungs:   Clear to auscultation BL, Respirations unlabored,  No wheezes / crackles  Cardio:    S1/S2, RRR, No murmure, No Rubs or Gallops   Abdomen:  Soft, non-tender, bowel sounds active all four quadrants, no  guarding or peritoneal signs.  Muscular  skeletal:  Back pain-Limited exam -global generalized weaknesses - in bed, able to move all 4 extremities,   Reports of no lower extremity paresthesia 2+ pulses,  symmetric, No pitting edema  Skin:  Dry, warm to touch, negative for any Rashes,  Wounds: Please see nursing documentation          Condition at discharge: fair  The results of significant diagnostics from this hospitalization (including imaging, microbiology, ancillary and laboratory) are listed below for reference.   Imaging Studies: CT L-SPINE NO CHARGE  Result Date: 11/09/2022 CLINICAL DATA:  Trip and fall 2 nights ago, severe right hip pain EXAM: CT CHEST, ABDOMEN, AND PELVIS WITH CONTRAST CT THORACIC AND LUMBAR SPINE WITH CONTRAST TECHNIQUE: Multidetector CT imaging of the chest, abdomen and pelvis was performed following the standard protocol during bolus administration of intravenous contrast. Multidetector CT imaging of the thoracic and lumbar  spine was performed following the standard protocol during bolus administration of intravenous contrast. RADIATION DOSE REDUCTION: This exam was performed according to the departmental dose-optimization program which includes automated exposure control, adjustment of the mA and/or kV according to patient size and/or use of iterative reconstruction technique. CONTRAST:  75mL OMNIPAQUE IOHEXOL 300 MG/ML  SOLN COMPARISON:  CT chest, 07/27/2019 CT abdomen pelvis, 07/09/2013 FINDINGS: CT CHEST FINDINGS Cardiovascular: Aortic atherosclerosis. Normal heart size. Left coronary artery calcifications. No pericardial effusion. Mediastinum/Nodes: No enlarged mediastinal, hilar, or axillary lymph nodes. Thyroid gland, trachea, and esophagus demonstrate no significant findings. Lungs/Pleura: Multifocal heterogeneous airspace opacity and consolidation throughout the lungs, most conspicuously in the right middle lobe (series 9, image 95) and superior segment  right lower lobe (series 9, image 83). No pleural effusion or pneumothorax. Musculoskeletal: No chest wall mass or suspicious osseous lesions identified. CT ABDOMEN PELVIS FINDINGS Hepatobiliary: No solid liver abnormality is seen. No gallstones, gallbladder wall thickening, or biliary dilatation. Pancreas: Fatty atrophy of the pancreatic parenchyma. No pancreatic ductal dilatation or surrounding inflammatory changes. Spleen: Normal in size without significant abnormality. Adrenals/Urinary Tract: Adrenal glands are unremarkable. Kidneys are normal, without renal calculi, solid lesion, or hydronephrosis. Bladder is unremarkable. Stomach/Bowel: Evidence of prior hiatal hernia repair. Stomach is otherwise within normal limits. Appendix not clearly visualized and may be surgically absent. No evidence of bowel wall thickening, distention, or inflammatory changes. Vascular/Lymphatic: Aortic atherosclerosis. No enlarged abdominal or pelvic lymph nodes. Reproductive: No mass or other abnormality. Other: Small fat containing umbilical hernia.  No ascites. Musculoskeletal: No acute osseous findings. CT THORACIC AND LUMBAR SPINE FINDINGS Alignment: Normal thoracic kyphosis. Minimal degenerative anterolisthesis of L4 on L5, with otherwise normal lumbar lordosis. Vertebral bodies: Mild, acute appearing superior endplate deformity of L2 without significant anterior height loss (series 5, image 33). Mild superior endplate deformity and buckled anterior cortical fracture of the S1 and S2 sacral segments (series 5, image 34). Minimal, nonacute wedging of the T11 vertebral body. Disc spaces: Intact. Paraspinous soft tissues: Unremarkable. IMPRESSION: 1. Mild, acute appearing superior endplate deformity of L2 without significant anterior height loss. 2. Mild superior endplate deformity and buckled anterior cortical fracture of the S1 and S2 sacral segments. 3. No evidence of acute traumatic injury to the organs of the chest, abdomen,  or pelvis. 4. No evidence of displaced right hip fracture per report of pain and inability to bear weight. 5. Multifocal heterogeneous airspace opacity and consolidation throughout the lungs, most conspicuously in the right middle lobe and superior segment right lower lobe. Findings are most consistent with multifocal infection or aspiration. 6. Coronary artery disease. Aortic Atherosclerosis (ICD10-I70.0). Electronically Signed   By: Jearld Lesch M.D.   On: 11/09/2022 19:50   CT CHEST ABDOMEN PELVIS W CONTRAST  Result Date: 11/09/2022 CLINICAL DATA:  Trip and fall 2 nights ago, severe right hip pain EXAM: CT CHEST, ABDOMEN, AND PELVIS WITH CONTRAST CT THORACIC AND LUMBAR SPINE WITH CONTRAST TECHNIQUE: Multidetector CT imaging of the chest, abdomen and pelvis was performed following the standard protocol during bolus administration of intravenous contrast. Multidetector CT imaging of the thoracic and lumbar spine was performed following the standard protocol during bolus administration of intravenous contrast. RADIATION DOSE REDUCTION: This exam was performed according to the departmental dose-optimization program which includes automated exposure control, adjustment of the mA and/or kV according to patient size and/or use of iterative reconstruction technique. CONTRAST:  75mL OMNIPAQUE IOHEXOL 300 MG/ML  SOLN COMPARISON:  CT chest, 07/27/2019 CT abdomen pelvis, 07/09/2013 FINDINGS:  CT CHEST FINDINGS Cardiovascular: Aortic atherosclerosis. Normal heart size. Left coronary artery calcifications. No pericardial effusion. Mediastinum/Nodes: No enlarged mediastinal, hilar, or axillary lymph nodes. Thyroid gland, trachea, and esophagus demonstrate no significant findings. Lungs/Pleura: Multifocal heterogeneous airspace opacity and consolidation throughout the lungs, most conspicuously in the right middle lobe (series 9, image 95) and superior segment right lower lobe (series 9, image 83). No pleural effusion or  pneumothorax. Musculoskeletal: No chest wall mass or suspicious osseous lesions identified. CT ABDOMEN PELVIS FINDINGS Hepatobiliary: No solid liver abnormality is seen. No gallstones, gallbladder wall thickening, or biliary dilatation. Pancreas: Fatty atrophy of the pancreatic parenchyma. No pancreatic ductal dilatation or surrounding inflammatory changes. Spleen: Normal in size without significant abnormality. Adrenals/Urinary Tract: Adrenal glands are unremarkable. Kidneys are normal, without renal calculi, solid lesion, or hydronephrosis. Bladder is unremarkable. Stomach/Bowel: Evidence of prior hiatal hernia repair. Stomach is otherwise within normal limits. Appendix not clearly visualized and may be surgically absent. No evidence of bowel wall thickening, distention, or inflammatory changes. Vascular/Lymphatic: Aortic atherosclerosis. No enlarged abdominal or pelvic lymph nodes. Reproductive: No mass or other abnormality. Other: Small fat containing umbilical hernia.  No ascites. Musculoskeletal: No acute osseous findings. CT THORACIC AND LUMBAR SPINE FINDINGS Alignment: Normal thoracic kyphosis. Minimal degenerative anterolisthesis of L4 on L5, with otherwise normal lumbar lordosis. Vertebral bodies: Mild, acute appearing superior endplate deformity of L2 without significant anterior height loss (series 5, image 33). Mild superior endplate deformity and buckled anterior cortical fracture of the S1 and S2 sacral segments (series 5, image 34). Minimal, nonacute wedging of the T11 vertebral body. Disc spaces: Intact. Paraspinous soft tissues: Unremarkable. IMPRESSION: 1. Mild, acute appearing superior endplate deformity of L2 without significant anterior height loss. 2. Mild superior endplate deformity and buckled anterior cortical fracture of the S1 and S2 sacral segments. 3. No evidence of acute traumatic injury to the organs of the chest, abdomen, or pelvis. 4. No evidence of displaced right hip fracture per  report of pain and inability to bear weight. 5. Multifocal heterogeneous airspace opacity and consolidation throughout the lungs, most conspicuously in the right middle lobe and superior segment right lower lobe. Findings are most consistent with multifocal infection or aspiration. 6. Coronary artery disease. Aortic Atherosclerosis (ICD10-I70.0). Electronically Signed   By: Jearld Lesch M.D.   On: 11/09/2022 19:50   CT T-SPINE NO CHARGE  Result Date: 11/09/2022 CLINICAL DATA:  Trip and fall 2 nights ago, severe right hip pain EXAM: CT CHEST, ABDOMEN, AND PELVIS WITH CONTRAST CT THORACIC AND LUMBAR SPINE WITH CONTRAST TECHNIQUE: Multidetector CT imaging of the chest, abdomen and pelvis was performed following the standard protocol during bolus administration of intravenous contrast. Multidetector CT imaging of the thoracic and lumbar spine was performed following the standard protocol during bolus administration of intravenous contrast. RADIATION DOSE REDUCTION: This exam was performed according to the departmental dose-optimization program which includes automated exposure control, adjustment of the mA and/or kV according to patient size and/or use of iterative reconstruction technique. CONTRAST:  75mL OMNIPAQUE IOHEXOL 300 MG/ML  SOLN COMPARISON:  CT chest, 07/27/2019 CT abdomen pelvis, 07/09/2013 FINDINGS: CT CHEST FINDINGS Cardiovascular: Aortic atherosclerosis. Normal heart size. Left coronary artery calcifications. No pericardial effusion. Mediastinum/Nodes: No enlarged mediastinal, hilar, or axillary lymph nodes. Thyroid gland, trachea, and esophagus demonstrate no significant findings. Lungs/Pleura: Multifocal heterogeneous airspace opacity and consolidation throughout the lungs, most conspicuously in the right middle lobe (series 9, image 95) and superior segment right lower lobe (series 9, image 83). No pleural effusion  or pneumothorax. Musculoskeletal: No chest wall mass or suspicious osseous lesions  identified. CT ABDOMEN PELVIS FINDINGS Hepatobiliary: No solid liver abnormality is seen. No gallstones, gallbladder wall thickening, or biliary dilatation. Pancreas: Fatty atrophy of the pancreatic parenchyma. No pancreatic ductal dilatation or surrounding inflammatory changes. Spleen: Normal in size without significant abnormality. Adrenals/Urinary Tract: Adrenal glands are unremarkable. Kidneys are normal, without renal calculi, solid lesion, or hydronephrosis. Bladder is unremarkable. Stomach/Bowel: Evidence of prior hiatal hernia repair. Stomach is otherwise within normal limits. Appendix not clearly visualized and may be surgically absent. No evidence of bowel wall thickening, distention, or inflammatory changes. Vascular/Lymphatic: Aortic atherosclerosis. No enlarged abdominal or pelvic lymph nodes. Reproductive: No mass or other abnormality. Other: Small fat containing umbilical hernia.  No ascites. Musculoskeletal: No acute osseous findings. CT THORACIC AND LUMBAR SPINE FINDINGS Alignment: Normal thoracic kyphosis. Minimal degenerative anterolisthesis of L4 on L5, with otherwise normal lumbar lordosis. Vertebral bodies: Mild, acute appearing superior endplate deformity of L2 without significant anterior height loss (series 5, image 33). Mild superior endplate deformity and buckled anterior cortical fracture of the S1 and S2 sacral segments (series 5, image 34). Minimal, nonacute wedging of the T11 vertebral body. Disc spaces: Intact. Paraspinous soft tissues: Unremarkable. IMPRESSION: 1. Mild, acute appearing superior endplate deformity of L2 without significant anterior height loss. 2. Mild superior endplate deformity and buckled anterior cortical fracture of the S1 and S2 sacral segments. 3. No evidence of acute traumatic injury to the organs of the chest, abdomen, or pelvis. 4. No evidence of displaced right hip fracture per report of pain and inability to bear weight. 5. Multifocal heterogeneous  airspace opacity and consolidation throughout the lungs, most conspicuously in the right middle lobe and superior segment right lower lobe. Findings are most consistent with multifocal infection or aspiration. 6. Coronary artery disease. Aortic Atherosclerosis (ICD10-I70.0). Electronically Signed   By: Jearld Lesch M.D.   On: 11/09/2022 19:50   CT Head Wo Contrast  Result Date: 11/09/2022 CLINICAL DATA:  Trip and fall 2 nights ago pain EXAM: CT HEAD WITHOUT CONTRAST CT CERVICAL SPINE WITHOUT CONTRAST TECHNIQUE: Multidetector CT imaging of the head and cervical spine was performed following the standard protocol without intravenous contrast. Multiplanar CT image reconstructions of the cervical spine were also generated. RADIATION DOSE REDUCTION: This exam was performed according to the departmental dose-optimization program which includes automated exposure control, adjustment of the mA and/or kV according to patient size and/or use of iterative reconstruction technique. COMPARISON:  07/15/2022 FINDINGS: CT HEAD FINDINGS Brain: No evidence of acute infarction, hemorrhage, hydrocephalus, extra-axial collection or mass lesion/mass effect. Periventricular white matter hypodensity. Vascular: No hyperdense vessel or unexpected calcification. Skull: Normal. Negative for fracture or focal lesion. Sinuses/Orbits: No acute finding. Other: None. CT CERVICAL SPINE FINDINGS Alignment: Normal. Skull base and vertebrae: No acute fracture. No primary bone lesion or focal pathologic process. Soft tissues and spinal canal: No prevertebral fluid or swelling. No visible canal hematoma. Disc levels: Focally moderate disc space height loss and osteophytosis of C5-C6 with otherwise mild disc degenerative change. Upper chest: Negative. Other: None. IMPRESSION: 1. No acute intracranial pathology. 2. No fracture or static subluxation of the cervical spine. 3. Focally moderate disc space height loss and osteophytosis of C5-C6 with  otherwise mild disc degenerative change. Electronically Signed   By: Jearld Lesch M.D.   On: 11/09/2022 19:25   CT Cervical Spine Wo Contrast  Result Date: 11/09/2022 CLINICAL DATA:  Trip and fall 2 nights ago pain EXAM: CT HEAD  WITHOUT CONTRAST CT CERVICAL SPINE WITHOUT CONTRAST TECHNIQUE: Multidetector CT imaging of the head and cervical spine was performed following the standard protocol without intravenous contrast. Multiplanar CT image reconstructions of the cervical spine were also generated. RADIATION DOSE REDUCTION: This exam was performed according to the departmental dose-optimization program which includes automated exposure control, adjustment of the mA and/or kV according to patient size and/or use of iterative reconstruction technique. COMPARISON:  07/15/2022 FINDINGS: CT HEAD FINDINGS Brain: No evidence of acute infarction, hemorrhage, hydrocephalus, extra-axial collection or mass lesion/mass effect. Periventricular white matter hypodensity. Vascular: No hyperdense vessel or unexpected calcification. Skull: Normal. Negative for fracture or focal lesion. Sinuses/Orbits: No acute finding. Other: None. CT CERVICAL SPINE FINDINGS Alignment: Normal. Skull base and vertebrae: No acute fracture. No primary bone lesion or focal pathologic process. Soft tissues and spinal canal: No prevertebral fluid or swelling. No visible canal hematoma. Disc levels: Focally moderate disc space height loss and osteophytosis of C5-C6 with otherwise mild disc degenerative change. Upper chest: Negative. Other: None. IMPRESSION: 1. No acute intracranial pathology. 2. No fracture or static subluxation of the cervical spine. 3. Focally moderate disc space height loss and osteophytosis of C5-C6 with otherwise mild disc degenerative change. Electronically Signed   By: Jearld Lesch M.D.   On: 11/09/2022 19:25   DG Hip Unilat W or Wo Pelvis 2-3 Views Right  Result Date: 11/09/2022 CLINICAL DATA:  Right hip pain after  falling. EXAM: DG HIP (WITH OR WITHOUT PELVIS) 2-3V RIGHT COMPARISON:  Pelvic radiographs 08/03/2020. Left hip MRI 12/15/2019. FINDINGS: The mineralization and alignment are normal. There is no evidence of acute fracture or dislocation. No evidence of femoral head osteonecrosis. Moderately advanced asymmetric left hip osteoarthritis again noted. There is minimal right hip joint space narrowing. The soft tissues appear unremarkable. IMPRESSION: No evidence of acute fracture or dislocation. Moderately advanced left hip osteoarthritis. Electronically Signed   By: Carey Bullocks M.D.   On: 11/09/2022 14:59    Microbiology: Results for orders placed or performed during the hospital encounter of 11/09/22  Resp panel by RT-PCR (RSV, Flu A&B, Covid) Anterior Nasal Swab     Status: None   Collection Time: 11/09/22  8:14 PM   Specimen: Anterior Nasal Swab  Result Value Ref Range Status   SARS Coronavirus 2 by RT PCR NEGATIVE NEGATIVE Final    Comment: (NOTE) SARS-CoV-2 target nucleic acids are NOT DETECTED.  The SARS-CoV-2 RNA is generally detectable in upper respiratory specimens during the acute phase of infection. The lowest concentration of SARS-CoV-2 viral copies this assay can detect is 138 copies/mL. A negative result does not preclude SARS-Cov-2 infection and should not be used as the sole basis for treatment or other patient management decisions. A negative result may occur with  improper specimen collection/handling, submission of specimen other than nasopharyngeal swab, presence of viral mutation(s) within the areas targeted by this assay, and inadequate number of viral copies(<138 copies/mL). A negative result must be combined with clinical observations, patient history, and epidemiological information. The expected result is Negative.  Fact Sheet for Patients:  BloggerCourse.com  Fact Sheet for Healthcare Providers:   SeriousBroker.it  This test is no t yet approved or cleared by the [place] FDA and  has been authorized for detection and/or diagnosis of SARS-CoV-2 by FDA under an Emergency Use Authorization (EUA). This EUA will remain  in effect (meaning this test can be used) for the duration of the COVID-19 declaration under Section 564(b)(1) of the Act, 21 U.S.C.section 360bbb-3(b)(1),  unless the authorization is terminated  or revoked sooner.       Influenza A by PCR NEGATIVE NEGATIVE Final   Influenza B by PCR NEGATIVE NEGATIVE Final    Comment: (NOTE) The Xpert Xpress SARS-CoV-2/FLU/RSV plus assay is intended as an aid in the diagnosis of influenza from Nasopharyngeal swab specimens and should not be used as a sole basis for treatment. Nasal washings and aspirates are unacceptable for Xpert Xpress SARS-CoV-2/FLU/RSV testing.  Fact Sheet for Patients: BloggerCourse.com  Fact Sheet for Healthcare Providers: SeriousBroker.it  This test is not yet approved or cleared by the [place] FDA and has been authorized for detection and/or diagnosis of SARS-CoV-2 by FDA under an Emergency Use Authorization (EUA). This EUA will remain in effect (meaning this test can be used) for the duration of the COVID-19 declaration under Section 564(b)(1) of the Act, 21 U.S.C. section 360bbb-3(b)(1), unless the authorization is terminated or revoked.     Resp Syncytial Virus by PCR NEGATIVE NEGATIVE Final    Comment: (NOTE) Fact Sheet for Patients: BloggerCourse.com  Fact Sheet for Healthcare Providers: SeriousBroker.it  This test is not yet approved or cleared by the [place] FDA and has been authorized for detection and/or diagnosis of SARS-CoV-2 by FDA under an Emergency Use Authorization (EUA). This EUA will remain in effect (meaning this test can be used) for  the duration of the COVID-19 declaration under Section 564(b)(1) of the Act, 21 U.S.C. section 360bbb-3(b)(1), unless the authorization is terminated or revoked.  Performed at [facility], [address], [place], [place] [address]   Culture, blood (routine x 2) Call MD if unable to obtain prior to antibiotics being given     Status: None (Preliminary result)   Collection Time: 11/10/22  1:15 AM   Specimen: BLOOD RIGHT FOREARM  Result Value Ref Range Status   Specimen Description BLOOD RIGHT FOREARM  Final   Special Requests   Final    BOTTLES DRAWN AEROBIC AND ANAEROBIC Blood Culture adequate volume   Culture   Final    NO GROWTH 1 DAY Performed at [facility], [address], [place], [place] [address]    Report Status PENDING  Incomplete  Culture, blood (routine x 2) Call MD if unable to obtain prior to antibiotics being given     Status: None (Preliminary result)   Collection Time: 11/10/22  1:15 AM   Specimen: Right Antecubital; Blood  Result Value Ref Range Status   Specimen Description RIGHT ANTECUBITAL  Final   Special Requests   Final    BOTTLES DRAWN AEROBIC AND ANAEROBIC Blood Culture results may not be optimal due to an excessive volume of blood received in culture bottles   Culture   Final    NO GROWTH 1 DAY Performed at [facility], [address] [place], [place] [address]    Report Status PENDING  Incomplete    Labs: CBC: Recent Labs  Lab 11/09/22 1322 11/10/22 0113 11/11/22 0511  WBC 12.3* 10.3 9.2  NEUTROABS 9.3*  --   --   HGB 12.2 10.6* 11.1*  HCT 39.1 32.7* 35.0*  MCV 99.5 97.9 97.2  PLT 215 188 202   Basic Metabolic Panel: Recent Labs  Lab 11/09/22 1322 11/10/22 0113 11/11/22 0511  NA 136 135 134*  K 3.9 3.3* 3.4*  CL 100 100 98  CO2 26 24 24   GLUCOSE 103* 79 102*  BUN 16 14 11   CREATININE 0.67 0.69 0.67  CALCIUM 9.1 8.5* 8.3*  MG  --  2.0 2.0  PHOS  --  3.1  --    Liver Function Tests: Recent Labs  Lab 11/10/22 0113  AST 22   ALT 18  ALKPHOS 54  BILITOT 0.8  PROT 6.0*  ALBUMIN 3.4*   CBG: No results for input(s): "GLUCAP" in the last 168 hours.  Discharge time spent: greater than 30 minutes.  Signed: Kendell Bane, MD [facility] 11/11/2022

## 2022-11-12 DIAGNOSIS — J690 Pneumonitis due to inhalation of food and vomit: Secondary | ICD-10-CM | POA: Diagnosis not present

## 2022-11-12 LAB — LEGIONELLA PNEUMOPHILA SEROGP 1 UR AG: L. pneumophila Serogp 1 Ur Ag: NEGATIVE

## 2022-11-12 MED ORDER — METOPROLOL TARTRATE 25 MG PO TABS
25.0000 mg | ORAL_TABLET | Freq: Two times a day (BID) | ORAL | Status: DC
  Administered 2022-11-12: 25 mg via ORAL
  Filled 2022-11-12: qty 1

## 2022-11-12 MED ORDER — METOPROLOL TARTRATE 25 MG PO TABS: 25.0000 mg | ORAL_TABLET | Freq: Two times a day (BID) | ORAL | 1 refills | Status: DC

## 2022-11-12 MED ORDER — POTASSIUM CHLORIDE CRYS ER 20 MEQ PO TBCR
40.0000 meq | EXTENDED_RELEASE_TABLET | Freq: Once | ORAL | Status: AC
  Administered 2022-11-12: 40 meq via ORAL
  Filled 2022-11-12: qty 2

## 2022-11-12 NOTE — TOC Progression Note (Signed)
Patient Details  Name: Heidi Fuentes MRN: [id number] Date of Birth: 1945/09/21  Transition of Care [facility]) CM/SW Contact  Leitha Bleak, RN Phone Number: 11/12/2022, 10:41 AM  Clinical Narrative:   Patient will discharge home needing 24/7 care and max assist. Patient has deformity of L2 and anterior cortical fracture of the S1 and S2 sacral segments. Patient has used all of her rehab days. Patient can not private pay the rate for Rehab is $351 per day, and required to pay 2 months upfront. Case Manager set up [facility] PT/OT/SW.  Son's are active with her care and hiring caregivers to stay with patient.

## 2022-11-12 NOTE — Progress Notes (Signed)
Physical Therapy Treatment Patient Details Name: Heidi Fuentes MRN: [id number] DOB: [birth year] Today's Date: 11/12/2022   History of Present Illness Heidi Fuentes is a 77 y.o. female with medical history significant of hypertension, asthma, bipolar disorder who presents to the emergency department due to sustaining a fall about 2 days ago whereby she lost her balance while getting into a bed and she landed on her bottom and accidentally hit her head in the process.  She developed swelling of her scalp which has since resolved, but now complaining of right hip pain and right lower back with difficulty of bearing weight on her legs.    PT Comments  Patient agreeable for therapy after receiving pain medication, her TLSO brace in bag in chair at bedside. Patient required Max assist for donning TLSO brace while seated at bedside, demonstrated poor carryover for using RW due to holding walker off floor instead of using for support, no improvement after repeated verbal/tactile cueing, required bilateral hand held assist supporting at elbows to take a few side steps during transfer to chair.  Patient incontinent of stool when standing and nursing staff notified that patient needs to be cleaned.  Patient tolerated sitting up in chair after therapy - nursing staff aware.  Patient will benefit from continued skilled physical therapy in hospital and recommended venue below to increase strength, balance, endurance for safe ADLs and gait.      If plan is discharge home, recommend the following: A lot of help with bathing/dressing/bathroom;A lot of help with walking and/or transfers;Help with stairs or ramp for entrance;Assistance with cooking/housework   Can travel by private vehicle     No  Equipment Recommendations  None recommended by PT    Recommendations for Other Services       Precautions / Restrictions Precautions Precautions: Fall Restrictions Weight Bearing Restrictions: No     Mobility   Bed Mobility Overal bed mobility: Needs Assistance Bed Mobility: Rolling, Sidelying to Sit Rolling: Min assist Sidelying to sit: Mod assist       General bed mobility comments: increased time, labored movement    Transfers Overall transfer level: Needs assistance Equipment used: Rolling walker (2 wheels), 1 person hand held assist Transfers: Sit to/from Stand, Bed to chair/wheelchair/BSC Sit to Stand: Min assist, Mod assist   Step pivot transfers: Min assist, Mod assist       General transfer comment: poor carryover for using RW, required hand held assist to transfer to chair    Ambulation/Gait   Gait Distance (Feet): 5 Feet Assistive device: 1 person hand held assist Gait Pattern/deviations: Decreased step length - right, Decreased step length - left, Decreased stride length, Knees buckling Gait velocity: slow     General Gait Details: unable to attempt ambulation using RW due to patient picking walker up instead of leaning on it, fair/good return for taking steps with bilateral hand held assist taking side steps at bedside   Stairs             Wheelchair Mobility     Tilt Bed    Modified Rankin (Stroke Patients Only)       Balance Overall balance assessment: Needs assistance Sitting-balance support: Feet supported, No upper extremity supported Sitting balance-Leahy Scale: Fair Sitting balance - Comments: fair/good seated at EOB   Standing balance support: During functional activity, Bilateral upper extremity supported Standing balance-Leahy Scale: Poor Standing balance comment: poor using RW due to holding walker off floor, fair/poor with bilateral hand held assist  Cognition Arousal: Alert Behavior During Therapy: WFL for tasks assessed/performed Overall Cognitive Status: No family/caregiver present to determine baseline cognitive functioning                                 General Comments:  Requires repeated verbal/tactile cuing to follow instructions with fair/poor carryover        Exercises      General Comments        Pertinent Vitals/Pain Pain Assessment Pain Assessment: Faces Faces Pain Scale: Hurts little more Pain Location: back Pain Descriptors / Indicators: Grimacing, Sore, Discomfort Pain Intervention(s): Limited activity within patient's tolerance, Monitored during session, Repositioned, RN gave pain meds during session    Home Living                          Prior Function            PT Goals (current goals can now be found in the care plan section) Acute Rehab PT Goals Patient Stated Goal: return home after rehab PT Goal Formulation: With patient Time For Goal Achievement: 11/24/22 Potential to Achieve Goals: Good Progress towards PT goals: Progressing toward goals    Frequency    Min 3X/week      PT Plan      Co-evaluation              AM-PAC PT "6 Clicks" Mobility   Outcome Measure  Help needed turning from your back to your side while in a flat bed without using bedrails?: A Little Help needed moving from lying on your back to sitting on the side of a flat bed without using bedrails?: A Lot Help needed moving to and from a bed to a chair (including a wheelchair)?: A Lot Help needed standing up from a chair using your arms (e.g., wheelchair or bedside chair)?: A Lot Help needed to walk in hospital room?: A Lot Help needed climbing 3-5 steps with a railing? : A Lot 6 Click Score: 13    End of Session   Activity Tolerance: Patient tolerated treatment well;Patient limited by fatigue Patient left: in chair;with call bell/phone within reach Nurse Communication: Mobility status PT Visit Diagnosis: Unsteadiness on feet (R26.81);Other abnormalities of gait and mobility (R26.89);Muscle weakness (generalized) (M62.81)     Time: 4782-9562 PT Time Calculation (min) (ACUTE ONLY): 30 min  Charges:    $Therapeutic  Activity: 23-37 mins PT General Charges $$ ACUTE PT VISIT: 1 Visit                     3:22 PM, 11/12/22 Ocie Bob, MPT Physical Therapist with [facility] [phone] office [phone] mobile phone

## 2022-11-12 NOTE — TOC Initial Note (Signed)
Transition of Care [facility]) - Initial/Assessment Note    Patient Details  Name: Heidi Fuentes MRN: [id number] Date of Birth: [birth year]  [facility]) CM/SW Contact:    Leitha Bleak, RN Phone Number: 11/12/2022, 11:25 AM  Clinical Narrative:      Patient discharging home with [facility] home health. Son has confirmed sitters. Hilbert Corrigan updated with discharge.              Expected Discharge Plan: Home w Home Health Services Barriers to Discharge: Barriers Resolved   Patient Goals and CMS Choice Patient states their goals for this hospitalization and ongoing recovery are:: agreeable to SNF CMS Medicare.gov Compare Post Acute Care list provided to:: Patient Choice offered to / list presented to : Patient      Expected Discharge Plan and Services       Living arrangements for the past 2 months: Single Family Home Expected Discharge Date: 11/11/22               DME Arranged: Brace, LSO (TLSO) DME Agency: Other - [facility] [employer]) Date DME Agency Contacted: 11/10/22 Time DME Agency Contacted: 1108 Representative spoke with at DME Agency: called [phone]       Prior Living Arrangements/Services Living arrangements for the past 2 months: Single Family Home          Current home services: DME    Activities of Daily Living   ADL Screening (condition at time of admission) Independently performs ADLs?: No Does the patient have a NEW difficulty with bathing/dressing/toileting/self-feeding that is expected to last >3 days?: Yes (Initiates electronic notice to provider for possible OT consult) Does the patient have a NEW difficulty with getting in/out of bed, walking, or climbing stairs that is expected to last >3 days?: Yes (Initiates electronic notice to provider for possible PT consult) Does the patient have a NEW difficulty with communication that is expected to last >3 days?: No Is the patient deaf or have difficulty hearing?: No Does the  patient have difficulty seeing, even when wearing glasses/contacts?: No Does the patient have difficulty concentrating, remembering, or making decisions?: No  Permission Sought/Granted           Emotional Assessment   Attitude/Demeanor/Rapport: Engaged Affect (typically observed): Accepting Orientation: : Oriented to Self, Oriented to Place, Oriented to  Time, Oriented to Situation Alcohol / Substance Use: Not Applicable Psych Involvement: No (comment)  Admission diagnosis:  Aspiration pneumonia (HCC) [J69.0] Fall, initial encounter [W19.XXXA] Closed compression fracture of L2 lumbar vertebra, initial encounter (HCC) [S32.020A] Multifocal pneumonia [J18.9] Closed fracture of sacrum, unspecified portion of sacrum, initial encounter (HCC) [S32.10XA] Patient Active Problem List   Diagnosis Date Noted   Compression fracture of pelvis (HCC) 11/10/2022   Aspiration pneumonia (HCC) 11/09/2022   Agitation 07/27/2022   Mixed bipolar I disorder (HCC) 07/22/2022   Delirium 07/22/2022   Depression, major, recurrent, mild (HCC) 07/22/2022   Severe sepsis (HCC) 07/15/2022   UTI (urinary tract infection) 07/15/2022   Encephalopathy, metabolic 07/15/2022   History of adenomatous polyp of colon 05/12/2021   Bipolar affective disorder, depressed, mild (HCC) 08/07/2020   Rhabdomyolysis 08/03/2020   Elevated LFTs 08/03/2020   Unilateral primary osteoarthritis, left hip 03/26/2020   Pedal edema 03/18/2020   Pain in joint involving ankle and foot 12/20/2019   Bronchiectasis without complication (HCC) 10/31/2019   DOE (dyspnea on exertion) 07/27/2019   Dysphagia 07/25/2018   Leukocytosis 07/25/2018   Closed fracture of left orbital floor (HCC)  07/25/2018   Exudative age-related macular degeneration, bilateral, with active choroidal neovascularization (HCC) 07/25/2018   CAP (community acquired pneumonia) due to MRSA (methicillin resistant Staphylococcus aureus) (HCC) 02/13/2018   Asthma,  chronic, unspecified asthma severity, with acute exacerbation 02/04/2018   Cough 11/02/2017   Gastroesophageal reflux disease without esophagitis 10/30/2017   Other specified glaucoma 10/30/2017   Uncomplicated asthma 10/30/2017   Chronic rhinitis 10/30/2017   Long-term current use of opiate analgesic 03/10/2017   Essential hypertension 02/17/2017   On long term drug therapy 02/08/2017   Chronic narcotic use 02/04/2017   Hiatal hernia 02/03/2017   Hypokalemia 09/06/2015   Fall    Iron deficiency anemia, unspecified 12/15/2013   Schatzki's ring 08/17/2013   Anemia 08/17/2013   Encounter for screening colonoscopy 08/17/2013   Constipation 07/10/2013   Chronic pain syndrome 07/10/2013   Acquired hypothyroidism 07/09/2013   Colitis 07/09/2013   Asthma, chronic 07/09/2013   PCP:  Tanna Furry, MD Pharmacy:   [facility] [address] [place] - [address] [place] [address] Phone: [phone] Fax: [phone]  [facility] - [place], [place] - [address] [place] [address] Phone: [phone] Fax: [phone]  [facility] - [place], [place] - [address] [place] [address] Phone: [phone] Fax: [phone]   Social Determinants of Health (SDOH) Social History: SDOH Screenings   Food Insecurity: No Food Insecurity (11/10/2022)  Housing: Low Risk  (11/10/2022)  Transportation Needs: No Transportation Needs (11/10/2022)  Utilities: Not At Risk (11/10/2022)  Tobacco Use: Low Risk  (11/10/2022)   SDOH Interventions:    Readmission Risk Interventions    11/10/2022   11:05 AM  Readmission Risk Prevention Plan  Transportation Screening Complete  Medication Review (RN Care Manager) Complete  PCP or Specialist appointment within 3-5 days of discharge Not Complete  HRI or Home Care Consult Complete  SW Recovery Care/Counseling Consult  Complete  Palliative Care Screening Not Applicable  Skilled Nursing Facility Not Complete

## 2022-11-12 NOTE — Discharge Summary (Signed)
Physician Discharge Summary   Patient: DEITRA VATER MRN: [id number] DOB: 08-26-1945  Admit date:     11/09/2022  Discharge date: 11/12/22  Discharge Physician: Kendell Bane   PCP: Tanna Furry, MD   The patient was seen and examined this morning, no further changes to this discharge summary  Unfortunately patient is to return home as family cannot afford SNF, with no insurance approval  Will be discharged home with home health and 24/7 sitter    Recommendations at discharge:  Follow with orthopedic/spine in 1-2 weeks -Aggressive PT OT, fall precautions -Follow-up with the PCP in 1-2 weeks -Follow-up with a pulmonologist in 2-4 weeks  Discharge Diagnoses: Principal Problem:   Aspiration pneumonia (HCC) Active Problems:   Acquired hypothyroidism   Asthma, chronic   Gastroesophageal reflux disease without esophagitis   Essential hypertension   Compression fracture of pelvis (HCC)  Resolved Problems:   * No resolved hospital problems. *   Jenni NAELY NIEDER is a 77 y.o. female with medical history significant of hypertension, asthma, bipolar disorder who presents to the emergency department due to sustaining a fall about 2 days ago whereby she lost her balance while getting into a bed and she landed on her bottom and accidentally hit her head in the process.  She developed swelling of her scalp which has since resolved, but now complaining of right hip pain and right lower back with difficulty of bearing weight on her legs.  Patient was admitted with noted compression fracture to L2 as well as S1 and S2 and PT recommending SNF/rehab.  She is also noted to have aspiration pneumonia and has been started on azithromycin and Unasyn and continues to have ongoing cough and chest congestion.      Aspiration pneumonia CT chest, abdomen and pelvis was suggestive of aspiration/multiple focal infection Patient was started on ceftriaxone and azithromycin, was switched to  azithromycin and Unasyn---- plan to de-escalate/discontinue >>> p.o. Levaquin -Cultures remain negative to date  Continue Tylenol as needed Continue Mucinex, incentive spirometry, flutter valve  -Taper prednisone   Compression fracture CT thoracic and lumbar spine was suggestive of endplate deformity of L2 and anterior cortical fracture of the S1 and S2 sacral segments Continue Tylenol as needed Continue p.o. analgesics oxycodone, muscle relaxants as needed  TLSO brace was provided PT/OT recommending SNF/rehab >>>> Per TOC patient unfortunately used all 4 days,, after returning home with home PT OT which has been arranged   Essential hypertension Continue Lopressor and losartan   Acquired hypothyroidism Continue Synthroid   Asthma Continue Breztri   GERD Continue Protonix     Disposition: Home health Diet recommendation:  Discharge Diet Orders (From admission, onward)     Start     Ordered   11/11/22 0000  Diet - low sodium heart healthy        11/11/22 1201           Cardiac diet DISCHARGE MEDICATION: Allergies as of 11/12/2022       Reactions   Sulfa Antibiotics Rash, Hives   Mirtazapine Other (See Comments)   HA's   Trazodone Other (See Comments)   Trazodone Hcl Other (See Comments)   strange dreams   Ziprasidone Other (See Comments)   cardiac issues   Aripiprazole Other (See Comments)   Bad dreams   Quetiapine Palpitations   Trazodone And Nefazodone Other (See Comments)   unknown   Ziprasidone Hcl Other (See Comments)   hospitalization specifics  Medication List     TAKE these medications    acetaminophen 500 MG tablet Commonly known as: TYLENOL Take 500 mg by mouth every 6 (six) hours as needed for mild pain or moderate pain. For pain   buPROPion 75 MG tablet Commonly known as: WELLBUTRIN Take 1 tablet (75 mg total) by mouth 2 (two) times daily.   diphenhydrAMINE 25 mg capsule Commonly known as: BENADRYL Take 25 mg by mouth  every 6 (six) hours as needed for allergies.   FLUoxetine 40 MG capsule Commonly known as: PROZAC Take 40 mg by mouth daily.   fluticasone 50 MCG/ACT nasal spray Commonly known as: FLONASE Place 2 sprays into both nostrils daily.   gabapentin 300 MG capsule Commonly known as: NEURONTIN Take 300 mg by mouth 2 (two) times daily.   ketoconazole 2 % cream Commonly known as: NIZORAL Apply 1 Application topically daily as needed for irritation.   lamoTRIgine 200 MG tablet Commonly known as: LAMICTAL Take 200 mg by mouth daily.   levofloxacin 750 MG tablet Commonly known as: Levaquin Take 1 tablet (750 mg total) by mouth daily for 5 days.   levothyroxine 50 MCG tablet Commonly known as: SYNTHROID Take 1 tablet (50 mcg total) by mouth daily.   losartan 25 MG tablet Commonly known as: COZAAR Take 1 tablet (25 mg total) by mouth daily.   melatonin 5 MG Tabs Take 5 mg by mouth at bedtime.   methylPREDNISolone 4 MG Tbpk tablet Commonly known as: MEDROL DOSEPAK Medrol Dosepak take as instructed   metoprolol tartrate 25 MG tablet Commonly known as: LOPRESSOR Take 1 tablet (25 mg total) by mouth 2 (two) times daily. What changed: how much to take   montelukast 10 MG tablet Commonly known as: SINGULAIR Take 10 mg by mouth daily.   Oxycodone HCl 10 MG Tabs Take 1 tablet (10 mg total) by mouth every 8 (eight) hours as needed for up to 5 days (severe pain).   pantoprazole 40 MG tablet Commonly known as: Protonix Take 1 tablet (40 mg total) by mouth daily. What changed: additional instructions   Symbicort 160-4.5 MCG/ACT inhaler Generic drug: budesonide-formoterol Inhale 2 puffs into the lungs in the morning and at bedtime.        Follow-up Information     [facility] Follow up.   Why: They will call to scheudule you next visit.               Discharge Exam: Filed Weights   11/09/22 1238  Weight: 56.2 kg         General:  AAO x  3,  cooperative, no distress;   HEENT:  Normocephalic, PERRL, otherwise with in Normal limits   Neuro:  CNII-XII intact. , normal motor and sensation, reflexes intact   Lungs:   Clear to auscultation BL, Respirations unlabored,  +++ wheezes /rhonchi, no crackles  Cardio:    S1/S2, RRR, No murmure, No Rubs or Gallops   Abdomen:  Soft, non-tender, bowel sounds active all four quadrants, no guarding or peritoneal signs.  Muscular  skeletal:  Significant back pain, limited range of motion due to pain,  Mobility limited due to back pain  Overall Limited exam - sever global generalized weaknesses - in bed, able to move all 4 extremities,   2+ pulses,  symmetric, No pitting edema  Skin:  Dry, warm to touch, negative for any Rashes,  Wounds: Please see nursing documentation  Condition at discharge: fair  The results of significant diagnostics from this hospitalization (including imaging, microbiology, ancillary and laboratory) are listed below for reference.   Imaging Studies: CT L-SPINE NO CHARGE  Result Date: 11/09/2022 CLINICAL DATA:  Trip and fall 2 nights ago, severe right hip pain EXAM: CT CHEST, ABDOMEN, AND PELVIS WITH CONTRAST CT THORACIC AND LUMBAR SPINE WITH CONTRAST TECHNIQUE: Multidetector CT imaging of the chest, abdomen and pelvis was performed following the standard protocol during bolus administration of intravenous contrast. Multidetector CT imaging of the thoracic and lumbar spine was performed following the standard protocol during bolus administration of intravenous contrast. RADIATION DOSE REDUCTION: This exam was performed according to the departmental dose-optimization program which includes automated exposure control, adjustment of the mA and/or kV according to patient size and/or use of iterative reconstruction technique. CONTRAST:  75mL OMNIPAQUE IOHEXOL 300 MG/ML  SOLN COMPARISON:  CT chest, 07/27/2019 CT abdomen pelvis, 07/09/2013 FINDINGS: CT CHEST  FINDINGS Cardiovascular: Aortic atherosclerosis. Normal heart size. Left coronary artery calcifications. No pericardial effusion. Mediastinum/Nodes: No enlarged mediastinal, hilar, or axillary lymph nodes. Thyroid gland, trachea, and esophagus demonstrate no significant findings. Lungs/Pleura: Multifocal heterogeneous airspace opacity and consolidation throughout the lungs, most conspicuously in the right middle lobe (series 9, image 95) and superior segment right lower lobe (series 9, image 83). No pleural effusion or pneumothorax. Musculoskeletal: No chest wall mass or suspicious osseous lesions identified. CT ABDOMEN PELVIS FINDINGS Hepatobiliary: No solid liver abnormality is seen. No gallstones, gallbladder wall thickening, or biliary dilatation. Pancreas: Fatty atrophy of the pancreatic parenchyma. No pancreatic ductal dilatation or surrounding inflammatory changes. Spleen: Normal in size without significant abnormality. Adrenals/Urinary Tract: Adrenal glands are unremarkable. Kidneys are normal, without renal calculi, solid lesion, or hydronephrosis. Bladder is unremarkable. Stomach/Bowel: Evidence of prior hiatal hernia repair. Stomach is otherwise within normal limits. Appendix not clearly visualized and may be surgically absent. No evidence of bowel wall thickening, distention, or inflammatory changes. Vascular/Lymphatic: Aortic atherosclerosis. No enlarged abdominal or pelvic lymph nodes. Reproductive: No mass or other abnormality. Other: Small fat containing umbilical hernia.  No ascites. Musculoskeletal: No acute osseous findings. CT THORACIC AND LUMBAR SPINE FINDINGS Alignment: Normal thoracic kyphosis. Minimal degenerative anterolisthesis of L4 on L5, with otherwise normal lumbar lordosis. Vertebral bodies: Mild, acute appearing superior endplate deformity of L2 without significant anterior height loss (series 5, image 33). Mild superior endplate deformity and buckled anterior cortical fracture of the  S1 and S2 sacral segments (series 5, image 34). Minimal, nonacute wedging of the T11 vertebral body. Disc spaces: Intact. Paraspinous soft tissues: Unremarkable. IMPRESSION: 1. Mild, acute appearing superior endplate deformity of L2 without significant anterior height loss. 2. Mild superior endplate deformity and buckled anterior cortical fracture of the S1 and S2 sacral segments. 3. No evidence of acute traumatic injury to the organs of the chest, abdomen, or pelvis. 4. No evidence of displaced right hip fracture per report of pain and inability to bear weight. 5. Multifocal heterogeneous airspace opacity and consolidation throughout the lungs, most conspicuously in the right middle lobe and superior segment right lower lobe. Findings are most consistent with multifocal infection or aspiration. 6. Coronary artery disease. Aortic Atherosclerosis (ICD10-I70.0). Electronically Signed   By: Jearld Lesch M.D.   On: 11/09/2022 19:50   CT CHEST ABDOMEN PELVIS W CONTRAST  Result Date: 11/09/2022 CLINICAL DATA:  Trip and fall 2 nights ago, severe right hip pain EXAM: CT CHEST, ABDOMEN, AND PELVIS WITH CONTRAST CT THORACIC AND LUMBAR SPINE WITH CONTRAST TECHNIQUE: Multidetector  CT imaging of the chest, abdomen and pelvis was performed following the standard protocol during bolus administration of intravenous contrast. Multidetector CT imaging of the thoracic and lumbar spine was performed following the standard protocol during bolus administration of intravenous contrast. RADIATION DOSE REDUCTION: This exam was performed according to the departmental dose-optimization program which includes automated exposure control, adjustment of the mA and/or kV according to patient size and/or use of iterative reconstruction technique. CONTRAST:  75mL OMNIPAQUE IOHEXOL 300 MG/ML  SOLN COMPARISON:  CT chest, 07/27/2019 CT abdomen pelvis, 07/09/2013 FINDINGS: CT CHEST FINDINGS Cardiovascular: Aortic atherosclerosis. Normal heart size.  Left coronary artery calcifications. No pericardial effusion. Mediastinum/Nodes: No enlarged mediastinal, hilar, or axillary lymph nodes. Thyroid gland, trachea, and esophagus demonstrate no significant findings. Lungs/Pleura: Multifocal heterogeneous airspace opacity and consolidation throughout the lungs, most conspicuously in the right middle lobe (series 9, image 95) and superior segment right lower lobe (series 9, image 83). No pleural effusion or pneumothorax. Musculoskeletal: No chest wall mass or suspicious osseous lesions identified. CT ABDOMEN PELVIS FINDINGS Hepatobiliary: No solid liver abnormality is seen. No gallstones, gallbladder wall thickening, or biliary dilatation. Pancreas: Fatty atrophy of the pancreatic parenchyma. No pancreatic ductal dilatation or surrounding inflammatory changes. Spleen: Normal in size without significant abnormality. Adrenals/Urinary Tract: Adrenal glands are unremarkable. Kidneys are normal, without renal calculi, solid lesion, or hydronephrosis. Bladder is unremarkable. Stomach/Bowel: Evidence of prior hiatal hernia repair. Stomach is otherwise within normal limits. Appendix not clearly visualized and may be surgically absent. No evidence of bowel wall thickening, distention, or inflammatory changes. Vascular/Lymphatic: Aortic atherosclerosis. No enlarged abdominal or pelvic lymph nodes. Reproductive: No mass or other abnormality. Other: Small fat containing umbilical hernia.  No ascites. Musculoskeletal: No acute osseous findings. CT THORACIC AND LUMBAR SPINE FINDINGS Alignment: Normal thoracic kyphosis. Minimal degenerative anterolisthesis of L4 on L5, with otherwise normal lumbar lordosis. Vertebral bodies: Mild, acute appearing superior endplate deformity of L2 without significant anterior height loss (series 5, image 33). Mild superior endplate deformity and buckled anterior cortical fracture of the S1 and S2 sacral segments (series 5, image 34). Minimal, nonacute  wedging of the T11 vertebral body. Disc spaces: Intact. Paraspinous soft tissues: Unremarkable. IMPRESSION: 1. Mild, acute appearing superior endplate deformity of L2 without significant anterior height loss. 2. Mild superior endplate deformity and buckled anterior cortical fracture of the S1 and S2 sacral segments. 3. No evidence of acute traumatic injury to the organs of the chest, abdomen, or pelvis. 4. No evidence of displaced right hip fracture per report of pain and inability to bear weight. 5. Multifocal heterogeneous airspace opacity and consolidation throughout the lungs, most conspicuously in the right middle lobe and superior segment right lower lobe. Findings are most consistent with multifocal infection or aspiration. 6. Coronary artery disease. Aortic Atherosclerosis (ICD10-I70.0). Electronically Signed   By: Jearld Lesch M.D.   On: 11/09/2022 19:50   CT T-SPINE NO CHARGE  Result Date: 11/09/2022 CLINICAL DATA:  Trip and fall 2 nights ago, severe right hip pain EXAM: CT CHEST, ABDOMEN, AND PELVIS WITH CONTRAST CT THORACIC AND LUMBAR SPINE WITH CONTRAST TECHNIQUE: Multidetector CT imaging of the chest, abdomen and pelvis was performed following the standard protocol during bolus administration of intravenous contrast. Multidetector CT imaging of the thoracic and lumbar spine was performed following the standard protocol during bolus administration of intravenous contrast. RADIATION DOSE REDUCTION: This exam was performed according to the departmental dose-optimization program which includes automated exposure control, adjustment of the mA and/or kV according to patient  size and/or use of iterative reconstruction technique. CONTRAST:  75mL OMNIPAQUE IOHEXOL 300 MG/ML  SOLN COMPARISON:  CT chest, 07/27/2019 CT abdomen pelvis, 07/09/2013 FINDINGS: CT CHEST FINDINGS Cardiovascular: Aortic atherosclerosis. Normal heart size. Left coronary artery calcifications. No pericardial effusion.  Mediastinum/Nodes: No enlarged mediastinal, hilar, or axillary lymph nodes. Thyroid gland, trachea, and esophagus demonstrate no significant findings. Lungs/Pleura: Multifocal heterogeneous airspace opacity and consolidation throughout the lungs, most conspicuously in the right middle lobe (series 9, image 95) and superior segment right lower lobe (series 9, image 83). No pleural effusion or pneumothorax. Musculoskeletal: No chest wall mass or suspicious osseous lesions identified. CT ABDOMEN PELVIS FINDINGS Hepatobiliary: No solid liver abnormality is seen. No gallstones, gallbladder wall thickening, or biliary dilatation. Pancreas: Fatty atrophy of the pancreatic parenchyma. No pancreatic ductal dilatation or surrounding inflammatory changes. Spleen: Normal in size without significant abnormality. Adrenals/Urinary Tract: Adrenal glands are unremarkable. Kidneys are normal, without renal calculi, solid lesion, or hydronephrosis. Bladder is unremarkable. Stomach/Bowel: Evidence of prior hiatal hernia repair. Stomach is otherwise within normal limits. Appendix not clearly visualized and may be surgically absent. No evidence of bowel wall thickening, distention, or inflammatory changes. Vascular/Lymphatic: Aortic atherosclerosis. No enlarged abdominal or pelvic lymph nodes. Reproductive: No mass or other abnormality. Other: Small fat containing umbilical hernia.  No ascites. Musculoskeletal: No acute osseous findings. CT THORACIC AND LUMBAR SPINE FINDINGS Alignment: Normal thoracic kyphosis. Minimal degenerative anterolisthesis of L4 on L5, with otherwise normal lumbar lordosis. Vertebral bodies: Mild, acute appearing superior endplate deformity of L2 without significant anterior height loss (series 5, image 33). Mild superior endplate deformity and buckled anterior cortical fracture of the S1 and S2 sacral segments (series 5, image 34). Minimal, nonacute wedging of the T11 vertebral body. Disc spaces: Intact.  Paraspinous soft tissues: Unremarkable. IMPRESSION: 1. Mild, acute appearing superior endplate deformity of L2 without significant anterior height loss. 2. Mild superior endplate deformity and buckled anterior cortical fracture of the S1 and S2 sacral segments. 3. No evidence of acute traumatic injury to the organs of the chest, abdomen, or pelvis. 4. No evidence of displaced right hip fracture per report of pain and inability to bear weight. 5. Multifocal heterogeneous airspace opacity and consolidation throughout the lungs, most conspicuously in the right middle lobe and superior segment right lower lobe. Findings are most consistent with multifocal infection or aspiration. 6. Coronary artery disease. Aortic Atherosclerosis (ICD10-I70.0). Electronically Signed   By: Jearld Lesch M.D.   On: 11/09/2022 19:50   CT Head Wo Contrast  Result Date: 11/09/2022 CLINICAL DATA:  Trip and fall 2 nights ago pain EXAM: CT HEAD WITHOUT CONTRAST CT CERVICAL SPINE WITHOUT CONTRAST TECHNIQUE: Multidetector CT imaging of the head and cervical spine was performed following the standard protocol without intravenous contrast. Multiplanar CT image reconstructions of the cervical spine were also generated. RADIATION DOSE REDUCTION: This exam was performed according to the departmental dose-optimization program which includes automated exposure control, adjustment of the mA and/or kV according to patient size and/or use of iterative reconstruction technique. COMPARISON:  07/15/2022 FINDINGS: CT HEAD FINDINGS Brain: No evidence of acute infarction, hemorrhage, hydrocephalus, extra-axial collection or mass lesion/mass effect. Periventricular white matter hypodensity. Vascular: No hyperdense vessel or unexpected calcification. Skull: Normal. Negative for fracture or focal lesion. Sinuses/Orbits: No acute finding. Other: None. CT CERVICAL SPINE FINDINGS Alignment: Normal. Skull base and vertebrae: No acute fracture. No primary bone  lesion or focal pathologic process. Soft tissues and spinal canal: No prevertebral fluid or swelling. No visible canal hematoma.  Disc levels: Focally moderate disc space height loss and osteophytosis of C5-C6 with otherwise mild disc degenerative change. Upper chest: Negative. Other: None. IMPRESSION: 1. No acute intracranial pathology. 2. No fracture or static subluxation of the cervical spine. 3. Focally moderate disc space height loss and osteophytosis of C5-C6 with otherwise mild disc degenerative change. Electronically Signed   By: Jearld Lesch M.D.   On: 11/09/2022 19:25   CT Cervical Spine Wo Contrast  Result Date: 11/09/2022 CLINICAL DATA:  Trip and fall 2 nights ago pain EXAM: CT HEAD WITHOUT CONTRAST CT CERVICAL SPINE WITHOUT CONTRAST TECHNIQUE: Multidetector CT imaging of the head and cervical spine was performed following the standard protocol without intravenous contrast. Multiplanar CT image reconstructions of the cervical spine were also generated. RADIATION DOSE REDUCTION: This exam was performed according to the departmental dose-optimization program which includes automated exposure control, adjustment of the mA and/or kV according to patient size and/or use of iterative reconstruction technique. COMPARISON:  07/15/2022 FINDINGS: CT HEAD FINDINGS Brain: No evidence of acute infarction, hemorrhage, hydrocephalus, extra-axial collection or mass lesion/mass effect. Periventricular white matter hypodensity. Vascular: No hyperdense vessel or unexpected calcification. Skull: Normal. Negative for fracture or focal lesion. Sinuses/Orbits: No acute finding. Other: None. CT CERVICAL SPINE FINDINGS Alignment: Normal. Skull base and vertebrae: No acute fracture. No primary bone lesion or focal pathologic process. Soft tissues and spinal canal: No prevertebral fluid or swelling. No visible canal hematoma. Disc levels: Focally moderate disc space height loss and osteophytosis of C5-C6 with otherwise mild  disc degenerative change. Upper chest: Negative. Other: None. IMPRESSION: 1. No acute intracranial pathology. 2. No fracture or static subluxation of the cervical spine. 3. Focally moderate disc space height loss and osteophytosis of C5-C6 with otherwise mild disc degenerative change. Electronically Signed   By: Jearld Lesch M.D.   On: 11/09/2022 19:25   DG Hip Unilat W or Wo Pelvis 2-3 Views Right  Result Date: 11/09/2022 CLINICAL DATA:  Right hip pain after falling. EXAM: DG HIP (WITH OR WITHOUT PELVIS) 2-3V RIGHT COMPARISON:  Pelvic radiographs 08/03/2020. Left hip MRI 12/15/2019. FINDINGS: The mineralization and alignment are normal. There is no evidence of acute fracture or dislocation. No evidence of femoral head osteonecrosis. Moderately advanced asymmetric left hip osteoarthritis again noted. There is minimal right hip joint space narrowing. The soft tissues appear unremarkable. IMPRESSION: No evidence of acute fracture or dislocation. Moderately advanced left hip osteoarthritis. Electronically Signed   By: Carey Bullocks M.D.   On: 11/09/2022 14:59    Microbiology: Results for orders placed or performed during the hospital encounter of 11/09/22  Resp panel by RT-PCR (RSV, Flu A&B, Covid) Anterior Nasal Swab     Status: None   Collection Time: 11/09/22  8:14 PM   Specimen: Anterior Nasal Swab  Result Value Ref Range Status   SARS Coronavirus 2 by RT PCR NEGATIVE NEGATIVE Final    Comment: (NOTE) SARS-CoV-2 target nucleic acids are NOT DETECTED.  The SARS-CoV-2 RNA is generally detectable in upper respiratory specimens during the acute phase of infection. The lowest concentration of SARS-CoV-2 viral copies this assay can detect is 138 copies/mL. A negative result does not preclude SARS-Cov-2 infection and should not be used as the sole basis for treatment or other patient management decisions. A negative result may occur with  improper specimen collection/handling, submission of  specimen other than nasopharyngeal swab, presence of viral mutation(s) within the areas targeted by this assay, and inadequate number of viral copies(<138 copies/mL). A negative result  must be combined with clinical observations, patient history, and epidemiological information. The expected result is Negative.  Fact Sheet for Patients:  BloggerCourse.com  Fact Sheet for Healthcare Providers:  SeriousBroker.it  This test is no t yet approved or cleared by the [place] FDA and  has been authorized for detection and/or diagnosis of SARS-CoV-2 by FDA under an Emergency Use Authorization (EUA). This EUA will remain  in effect (meaning this test can be used) for the duration of the COVID-19 declaration under Section 564(b)(1) of the Act, 21 U.S.C.section 360bbb-3(b)(1), unless the authorization is terminated  or revoked sooner.       Influenza A by PCR NEGATIVE NEGATIVE Final   Influenza B by PCR NEGATIVE NEGATIVE Final    Comment: (NOTE) The Xpert Xpress SARS-CoV-2/FLU/RSV plus assay is intended as an aid in the diagnosis of influenza from Nasopharyngeal swab specimens and should not be used as a sole basis for treatment. Nasal washings and aspirates are unacceptable for Xpert Xpress SARS-CoV-2/FLU/RSV testing.  Fact Sheet for Patients: BloggerCourse.com  Fact Sheet for Healthcare Providers: SeriousBroker.it  This test is not yet approved or cleared by the [place] FDA and has been authorized for detection and/or diagnosis of SARS-CoV-2 by FDA under an Emergency Use Authorization (EUA). This EUA will remain in effect (meaning this test can be used) for the duration of the COVID-19 declaration under Section 564(b)(1) of the Act, 21 U.S.C. section 360bbb-3(b)(1), unless the authorization is terminated or revoked.     Resp Syncytial Virus by PCR NEGATIVE NEGATIVE Final     Comment: (NOTE) Fact Sheet for Patients: BloggerCourse.com  Fact Sheet for Healthcare Providers: SeriousBroker.it  This test is not yet approved or cleared by the [place] FDA and has been authorized for detection and/or diagnosis of SARS-CoV-2 by FDA under an Emergency Use Authorization (EUA). This EUA will remain in effect (meaning this test can be used) for the duration of the COVID-19 declaration under Section 564(b)(1) of the Act, 21 U.S.C. section 360bbb-3(b)(1), unless the authorization is terminated or revoked.  Performed at [facility], [address], [place], [place] [address]   Culture, blood (routine x 2) Call MD if unable to obtain prior to antibiotics being given     Status: None (Preliminary result)   Collection Time: 11/10/22  1:15 AM   Specimen: BLOOD RIGHT FOREARM  Result Value Ref Range Status   Specimen Description BLOOD RIGHT FOREARM  Final   Special Requests   Final    BOTTLES DRAWN AEROBIC AND ANAEROBIC Blood Culture adequate volume   Culture   Final    NO GROWTH 2 DAYS Performed at [facility], [address] [place], [place] [address]    Report Status PENDING  Incomplete  Culture, blood (routine x 2) Call MD if unable to obtain prior to antibiotics being given     Status: None (Preliminary result)   Collection Time: 11/10/22  1:15 AM   Specimen: Right Antecubital; Blood  Result Value Ref Range Status   Specimen Description RIGHT ANTECUBITAL  Final   Special Requests   Final    BOTTLES DRAWN AEROBIC AND ANAEROBIC Blood Culture results may not be optimal due to an excessive volume of blood received in culture bottles   Culture   Final    NO GROWTH 2 DAYS Performed at [facility], [address], [place], [place] [address]    Report Status PENDING  Incomplete    Labs: CBC: Recent Labs  Lab 11/09/22 1322 11/10/22 0113  11/11/22 0511  WBC 12.3* 10.3 9.2  NEUTROABS 9.3*  --   --   HGB  12.2 10.6* 11.1*  HCT 39.1 32.7* 35.0*  MCV 99.5 97.9 97.2  PLT 215 188 202   Basic Metabolic Panel: Recent Labs  Lab 11/09/22 1322 11/10/22 0113 11/11/22 0511  NA 136 135 134*  K 3.9 3.3* 3.4*  CL 100 100 98  CO2 26 24 24   GLUCOSE 103* 79 102*  BUN 16 14 11   CREATININE 0.67 0.69 0.67  CALCIUM 9.1 8.5* 8.3*  MG  --  2.0 2.0  PHOS  --  3.1  --    Liver Function Tests: Recent Labs  Lab 11/10/22 0113  AST 22  ALT 18  ALKPHOS 54  BILITOT 0.8  PROT 6.0*  ALBUMIN 3.4*   CBG: No results for input(s): "GLUCAP" in the last 168 hours.  Discharge time spent: greater than 40 minutes.  Signed: Kendell Bane, MD [facility] 11/12/2022

## 2022-11-12 NOTE — Care Management Important Message (Signed)
Important Message  Patient Details  Name: Heidi Fuentes MRN: [id number] Date of Birth: 04-Dec-1945   Important Message Given:  Yes - Medicare IM     Corey Harold 11/12/2022, 10:24 AM

## 2022-11-15 LAB — CULTURE, BLOOD (ROUTINE X 2)
Culture: NO GROWTH
Culture: NO GROWTH
Special Requests: ADEQUATE

## 2022-11-19 ENCOUNTER — Ambulatory Visit (INDEPENDENT_AMBULATORY_CARE_PROVIDER_SITE_OTHER): Payer: Medicare Other | Admitting: Orthopaedic Surgery

## 2022-11-19 ENCOUNTER — Other Ambulatory Visit: Payer: Self-pay | Admitting: Orthopaedic Surgery

## 2022-11-19 DIAGNOSIS — S32028A Other fracture of second lumbar vertebra, initial encounter for closed fracture: Secondary | ICD-10-CM | POA: Diagnosis not present

## 2022-11-19 DIAGNOSIS — S3210XA Unspecified fracture of sacrum, initial encounter for closed fracture: Secondary | ICD-10-CM

## 2022-11-19 DIAGNOSIS — F3131 Bipolar disorder, current episode depressed, mild: Secondary | ICD-10-CM | POA: Diagnosis not present

## 2022-11-19 DIAGNOSIS — F1190 Opioid use, unspecified, uncomplicated: Secondary | ICD-10-CM | POA: Diagnosis not present

## 2022-11-20 DIAGNOSIS — S32009A Unspecified fracture of unspecified lumbar vertebra, initial encounter for closed fracture: Secondary | ICD-10-CM | POA: Insufficient documentation

## 2022-11-20 DIAGNOSIS — S3210XA Unspecified fracture of sacrum, initial encounter for closed fracture: Secondary | ICD-10-CM | POA: Insufficient documentation

## 2022-11-20 NOTE — Progress Notes (Signed)
Office Visit Note   Patient: Heidi Fuentes           Date of Birth: 1946-01-30           MRN: [id number] Visit Date: 11/19/2022              Requested by: Tanna Furry, MD [address] [place],  [place] [address] PCP: [name], DO   Assessment & Plan: Visit Diagnoses:  1. Chronic narcotic use   2. Bipolar affective disorder, depressed, mild (HCC)   3. Closed fracture of sacrum, unspecified portion of sacrum, initial encounter (HCC)   4. Other closed fracture of second lumbar vertebra, initial encounter [facility])     Plan: Patient can return in 1 month we reviewed her CAT scans MRI, discussed narcotic usage.  She has spinal stenosis but currently she is taking some which narcotics she is not a candidate for spine surgery and not a candidate for knee surgery and she does have knee osteoarthritis.  She is seeing other orthopedist including Dr. Magnus Ivan who opined similarly.  We discussed once the fractures of healed in few months then she could consider trying to decrease the dose.  She still needs to get up walk a few steps and can sit and rest.  Does not require a brace with her current injuries.  We discussed hyperalgesia that occurs with chronic narcotic usage which magnifies the pain.  Recheck 1 month repeat x-rays lumbar spine AP lateral.  Will need to make sure on lateral image we can see the sacrum as well as L2.  Follow-Up Instructions: No follow-ups on file.   Orders:  No orders of the defined types were placed in this encounter.  No orders of the defined types were placed in this encounter.     Procedures: No procedures performed   Clinical Data: No additional findings.   Subjective: No chief complaint on file.   HPI 77 year old female with osteoporosis lumbar spinal stenosis L4-5 and chronic high-dose narcotic pain medication with bipolar disorder.  Patient states she has degenerative disc disease and I reviewed MRI scan with her today and  explained to her she has spinal stenosis which should get relief with sitting or supine position and should only normally bother with prolonged standing or walking.  This is not usually treated with narcotic pain medication.  She has some steps to get up on her tall bed slipped fell has an S1 fracture, S2 fracture and also L2 endplate deformity without significant loss of anterior height.  Patient states that her pain is severe.  She states she does not care if she is addicted but just wants to take her pain pills.  She states she been Back on pain pills and was given 7.53 a day instead of the normal 10 mg 4-day oxycodone.  Recent admission for delirium with some pneumonia.  Review of Systems   Objective: Vital Signs: There were no vitals taken for this visit.  Physical Exam  Ortho Exam  Specialty Comments:  No specialty comments available.  Imaging: No results found.   PMFS History: Patient Active Problem List   Diagnosis Date Noted   Sacral fracture, closed (HCC) 11/20/2022   Lumbar vertebral fracture (HCC) 11/20/2022   Compression fracture of pelvis (HCC) 11/10/2022   Aspiration pneumonia (HCC) 11/09/2022   Agitation 07/27/2022   Mixed bipolar I disorder (HCC) 07/22/2022   Delirium 07/22/2022   Depression, major, recurrent, mild (HCC) 07/22/2022   Severe sepsis (HCC)  07/15/2022   UTI (urinary tract infection) 07/15/2022   Encephalopathy, metabolic 07/15/2022   History of adenomatous polyp of colon 05/12/2021   Bipolar affective disorder, depressed, mild (HCC) 08/07/2020   Rhabdomyolysis 08/03/2020   Elevated LFTs 08/03/2020   Unilateral primary osteoarthritis, left hip 03/26/2020   Pedal edema 03/18/2020   Pain in joint involving ankle and foot 12/20/2019   Bronchiectasis without complication (HCC) 10/31/2019   DOE (dyspnea on exertion) 07/27/2019   Dysphagia 07/25/2018   Leukocytosis 07/25/2018   Closed fracture of left orbital floor (HCC) 07/25/2018   Exudative  age-related macular degeneration, bilateral, with active choroidal neovascularization (HCC) 07/25/2018   CAP (community acquired pneumonia) due to MRSA (methicillin resistant Staphylococcus aureus) (HCC) 02/13/2018   Asthma, chronic, unspecified asthma severity, with acute exacerbation 02/04/2018   Cough 11/02/2017   Gastroesophageal reflux disease without esophagitis 10/30/2017   Other specified glaucoma 10/30/2017   Uncomplicated asthma 10/30/2017   Chronic rhinitis 10/30/2017   Long-term current use of opiate analgesic 03/10/2017   Essential hypertension 02/17/2017   On long term drug therapy 02/08/2017   Chronic narcotic use 02/04/2017   Hiatal hernia 02/03/2017   Hypokalemia 09/06/2015   Fall    Iron deficiency anemia, unspecified 12/15/2013   Schatzki's ring 08/17/2013   Anemia 08/17/2013   Encounter for screening colonoscopy 08/17/2013   Constipation 07/10/2013   Chronic pain syndrome 07/10/2013   Acquired hypothyroidism 07/09/2013   Colitis 07/09/2013   Asthma, chronic 07/09/2013   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(HCC)     Family History  Problem Relation Age of Onset   Lung cancer Mother    Heart attack Father    Bone cancer Brother    COPD Sister    Colon cancer Neg Hx     Past Surgical History:  Procedure Laterality Date   CATARACT EXTRACTION W/PHACO Right 10/06/2016   Procedure: CATARACT EXTRACTION PHACO AND INTRAOCULAR LENS  PLACEMENT (IOC);  Surgeon: Galen Manila, MD;  Location: [facility] ORS;  Service: Ophthalmology;  Laterality: Right;  [place] 00:32.5 AP% 14.5 CDE 4.71 Fluid pack lot # [id number]   CATARACT EXTRACTION W/PHACO Left 11/03/2016   Procedure: CATARACT EXTRACTION PHACO AND INTRAOCULAR LENS PLACEMENT (IOC);  Surgeon: Galen Manila, MD;  Location: [facility] ORS;  Service: Ophthalmology;  Laterality: Left;  [place] 00:36.6 AP% 16.0 CDE 5.86 Fluid Pack lot # [id number]   COCCYX REMOVAL     colonoscopy  2005   Dr. Karilyn Cota: mild melanosis coli, otherwise normal   COLONOSCOPY WITH PROPOFOL N/A 09/14/2013   YQM:VHQIONGEX coli. Colonic diverticulosis. Single colonic. Tubular adenoma. Next TCS 09/2020.   COLONOSCOPY WITH PROPOFOL N/A 07/24/2021   Procedure: COLONOSCOPY WITH PROPOFOL;  Surgeon: Corbin Ade, MD;  [facility];  Service: Endoscopy;  Laterality: N/A;  2:00pm  ERCP  1997   [facility]: biliary manometry abnormal, subsequent sphincterotomy    ESOPHAGEAL MANOMETRY N/A 12/21/2016   Surgeon: Napoleon Form, MD; EG junction outflow obstruction which could be secondary to large hiatal hernia and possible coiling of catheter.  Not consistent with achalasia or other variant.  No major peristaltic motility disorder.   ESOPHAGOGASTRODUODENOSCOPY (EGD) WITH PROPOFOL N/A 09/14/2013   WUJ:WJXBJYNW'G ring. Hiatal hernia. Status post Elease Hashimoto and biopsy disruption.    FOOT SURGERY     X2   HEMORROIDECTOMY     HERNIA REPAIR     inguinal right   HERNIA REPAIR     LAPAROSCOPIC PARAESOPHAGEAL HERNIA REPAIR  02/2017   Dr. Carolynn Sayers [facility])   [place] DILATION N/A 09/14/2013   Procedure: Alvy Beal;  Surgeon: Corbin Ade, MD;  Location: [facility];  Service: Endoscopy;  Laterality: N/A;  56   POLYPECTOMY N/A 09/14/2013   Procedure: POLYPECTOMY;  Surgeon: Corbin Ade, MD;  Location: [facility];  Service: Endoscopy;  Laterality: N/A;   POLYPECTOMY  07/24/2021   Procedure: POLYPECTOMY;  Surgeon:  Corbin Ade, MD;  [facility];  Service: Endoscopy;;   TONSILLECTOMY     Social History   Occupational History   Not on file  Tobacco Use   Smoking status: Never   Smokeless tobacco: Never   Tobacco comments:    NEVER SMOKED  Vaping Use   Vaping status: Never Used  Substance and Sexual Activity   Alcohol use: No   Drug use: No   Sexual activity: Not on file

## 2023-01-05 ENCOUNTER — Ambulatory Visit (INDEPENDENT_AMBULATORY_CARE_PROVIDER_SITE_OTHER): Payer: Medicare Other | Admitting: Internal Medicine

## 2023-01-05 ENCOUNTER — Encounter: Payer: Self-pay | Admitting: Internal Medicine

## 2023-01-05 ENCOUNTER — Other Ambulatory Visit: Payer: Self-pay | Admitting: *Deleted

## 2023-01-05 ENCOUNTER — Other Ambulatory Visit: Payer: Self-pay

## 2023-01-05 VITALS — BP 156/72 | HR 66 | Temp 97.9°F | Ht 59.0 in | Wt 122.8 lb

## 2023-01-05 DIAGNOSIS — R1310 Dysphagia, unspecified: Secondary | ICD-10-CM

## 2023-01-05 DIAGNOSIS — K623 Rectal prolapse: Secondary | ICD-10-CM

## 2023-01-05 DIAGNOSIS — K219 Gastro-esophageal reflux disease without esophagitis: Secondary | ICD-10-CM | POA: Diagnosis not present

## 2023-01-05 DIAGNOSIS — K5900 Constipation, unspecified: Secondary | ICD-10-CM

## 2023-01-05 DIAGNOSIS — Z860101 Personal history of adenomatous and serrated colon polyps: Secondary | ICD-10-CM

## 2023-01-05 DIAGNOSIS — Z8719 Personal history of other diseases of the digestive system: Secondary | ICD-10-CM

## 2023-01-05 DIAGNOSIS — K5909 Other constipation: Secondary | ICD-10-CM

## 2023-01-05 MED ORDER — LIDOCAINE-HYDROCORTISONE ACE 3-0.5 % EX CREA: TOPICAL_CREAM | CUTANEOUS | 1 refills | Status: AC

## 2023-01-05 NOTE — Progress Notes (Signed)
Primary Care Physician:  [name], DO Primary Gastroenterologist:  Dr. Jena Gauss  Pre-Procedure History & Physical: HPI:  Heidi Fuentes is a 77 y.o. female here for further evaluation of something protruding out of her rectum.  Patient with lifelong constipation; she fell and fractured her right ankle about 3 weeks ago; did not require casting or surgery as she reports.  Started to detect something protruding out of her rectum that felt like a balloon bout that time as well..  Goes back in and it is painful.  States stools have been very thin recently.  Has not seen any rectal bleeding.  States she has to position herself on the commode just right to be able to urinate.  She says constipation has been less of a problem this year.  3 vaginal deliveries previously History of colon polyps.  Last colonoscopy June 2023 1-2 small adenomas which were removed.  Redundant colon.  Melanosis coli present.  At that time, examination of the rectum including retroflexion views revealed no abnormalities. She has ongoing symptoms of dysphagia as well;  distant history of Schatzki's ring -dilated in the past; paraesophageal [facility]nia repair at [facility] back in 2019 done by Dr. Francee Gentile. GERD well-controlled on Protonix 40 mg once daily.   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[facility])     Past Surgical History:  Procedure Laterality Date   CATARACT EXTRACTION W/PHACO Right  10/06/2016   Procedure: CATARACT EXTRACTION PHACO AND INTRAOCULAR LENS PLACEMENT (IOC);  Surgeon: Galen Manila, MD;  Location: [facility];  Service: Ophthalmology;  Laterality: Right;  [place] 00:32.5 AP% 14.5 CDE 4.71 Fluid pack lot # [id number]   CATARACT EXTRACTION W/PHACO Left 11/03/2016   Procedure: CATARACT EXTRACTION PHACO AND INTRAOCULAR LENS PLACEMENT (IOC);  Surgeon: Galen Manila, MD;  Location: [facility];  Service: Ophthalmology;  Laterality: Left;  [place] 00:36.6 AP% 16.0 CDE 5.86 Fluid Pack lot # [id number]   COCCYX REMOVAL     colonoscopy  2005   Dr. Karilyn Cota: mild melanosis coli, otherwise normal   COLONOSCOPY WITH PROPOFOL N/A 09/14/2013   XBM:[id number] coli. Colonic diverticulosis. Single colonic. Tubular adenoma. Next TCS 09/2020.   COLONOSCOPY WITH PROPOFOL N/A 07/24/2021   Procedure: COLONOSCOPY WITH PROPOFOL;  Surgeon: Corbin Ade, MD;  [facility];  Service: Endoscopy;  Laterality: N/A;  2:00pm   ERCP  1997   [facility]: biliary manometry abnormal, subsequent sphincterotomy    ESOPHAGEAL MANOMETRY N/A 12/21/2016   Surgeon: Napoleon Form, MD; EG junction outflow obstruction which could be secondary to large hiatal hernia and possible coiling of catheter.  Not consistent with achalasia or other variant.  No major peristaltic motility disorder.   ESOPHAGOGASTRODUODENOSCOPY (EGD) WITH PROPOFOL N/A 09/14/2013   UUV:OZDGUYQI'H ring. Hiatal hernia. Status post  Maloney and biopsy disruption.    FOOT SURGERY     X2   HEMORROIDECTOMY     HERNIA REPAIR     inguinal right   HERNIA REPAIR     LAPAROSCOPIC PARAESOPHAGEAL HERNIA REPAIR  02/2017   Dr. Carolynn Sayers [facility])   [place] DILATION N/A 09/14/2013   Procedure: Alvy Beal;  Surgeon: Corbin Ade, MD;  Location: [facility];  Service: Endoscopy;  Laterality: N/A;  56   POLYPECTOMY N/A 09/14/2013   Procedure: POLYPECTOMY;  Surgeon: Corbin Ade, MD;  Location: [facility];  Service: Endoscopy;  Laterality:  N/A;   POLYPECTOMY  07/24/2021   Procedure: POLYPECTOMY;  Surgeon: Corbin Ade, MD;  [facility];  Service: Endoscopy;;   TONSILLECTOMY      Prior to Admission medications   Medication Sig Start Date End Date Taking? Authorizing Provider  acetaminophen (TYLENOL) 500 MG tablet Take 500 mg by mouth every 6 (six) hours as needed for mild pain or moderate pain. For pain   Yes [provider]  BREZTRI AEROSPHERE 160-9-4.8 MCG/ACT AERO Inhale into the lungs. 12/04/22  Yes [provider]  buPROPion (WELLBUTRIN) 75 MG tablet Take 1 tablet (75 mg total) by mouth 2 (two) times daily. 07/28/22  Yes Vassie Loll, MD  diphenhydrAMINE (BENADRYL) 25 mg capsule Take 25 mg by mouth every 6 (six) hours as needed for allergies.   Yes [provider]  FLUoxetine (PROZAC) 40 MG capsule Take 40 mg by mouth daily.   Yes [provider]  fluticasone (FLONASE) 50 MCG/ACT nasal spray Place 2 sprays into both nostrils daily.   Yes [provider]  gabapentin (NEURONTIN) 300 MG capsule Take 300 mg by mouth 2 (two) times daily.   Yes [provider]  ketoconazole (NIZORAL) 2 % cream Apply 1 Application topically daily as needed for irritation.   Yes [provider]  lamoTRIgine (LAMICTAL) 200 MG tablet Take 200 mg by mouth daily. 06/05/19  Yes [provider]  latanoprost (XALATAN) 0.005 % ophthalmic solution 1 drop daily. 12/17/22  Yes [provider]  levothyroxine (SYNTHROID) 50 MCG tablet Take 1 tablet (50 mcg total) by mouth daily. 11/12/22 02/10/23 Yes Shahmehdi, Gemma Payor, MD  losartan (COZAAR) 25 MG tablet Take 1 tablet (25 mg total) by mouth daily. 08/14/20  Yes Danford, Earl Lites, MD  melatonin 5 MG TABS Take 5 mg by mouth at bedtime. 11/17/17  Yes [provider]  metoprolol tartrate (LOPRESSOR) 25 MG tablet Take 1 tablet (25 mg total) by mouth 2 (two) times daily. 11/12/22 01/05/23 Yes Shahmehdi, Seyed A, MD   montelukast (SINGULAIR) 10 MG tablet Take 10 mg by mouth daily. 05/03/13  Yes [provider]  Oxycodone HCl 10 MG TABS Take 10 mg by mouth every 8 (eight) hours as needed. 12/26/22  Yes [provider]  pantoprazole (PROTONIX) 20 MG tablet SMARTSIG:1.0 Tablet(s) By Mouth Daily 12/04/22  Yes [provider]  SYMBICORT 160-4.5 MCG/ACT inhaler Inhale 2 puffs into the lungs in the morning and at bedtime. 05/25/22  Yes [provider]  VENTOLIN HFA 108 (90 Base) MCG/ACT inhaler SMARTSIG:1 Puff(s) By Mouth Every 4 Hours PRN 12/08/22  Yes [provider]  Lidocaine-Hydrocortisone Ace 3-0.5 % CREA Apply pea size amount to anorectum 4 times a day as needed 01/05/23   Kalin Amrhein, Gerrit Friends, MD    Allergies as of 01/05/2023 - Review Complete 01/05/2023  Allergen Reaction Noted   Sulfa antibiotics Rash and Hives  08/26/2010   Mirtazapine Other (See Comments) 11/10/2022   Trazodone Other (See Comments) 11/07/2022   Trazodone hcl Other (See Comments) 11/10/2022   Ziprasidone Other (See Comments) 11/07/2022   Aripiprazole Other (See Comments) 10/26/2017   Quetiapine Palpitations 02/17/2017   Trazodone and nefazodone Other (See Comments) 08/18/2017   Ziprasidone hcl Other (See Comments) 02/17/2017    Family History  Problem Relation Age of Onset   Lung cancer Mother    Heart attack Father    Bone cancer Brother    COPD Sister    Colon cancer Neg Hx     Social History   Socioeconomic History   Marital status: Divorced    Spouse name: Not on file   Number of children: Not on file   Years of education: Not on file   Highest education level: Not on file  Occupational History   Not on file  Tobacco Use   Smoking status: Never   Smokeless tobacco: Never   Tobacco comments:    NEVER SMOKED  Vaping Use   Vaping status: Never Used  Substance and Sexual Activity   Alcohol use: No   Drug use: No   Sexual activity: Not on file  Other Topics Concern   Not on  file  Social History Narrative   Not on file   Social Determinants of Health   Financial Resource Strain: Not on file  Food Insecurity: No Food Insecurity (11/10/2022)   Hunger Vital Sign    Worried About Running Out of Food in the Last Year: Never true    Ran Out of Food in the Last Year: Never true  Transportation Needs: No Transportation Needs (11/10/2022)   PRAPARE - Administrator, Civil Service (Medical): No    Lack of Transportation (Non-Medical): No  Physical Activity: Not on file  Stress: Not on file  Social Connections: Not on file  Intimate Partner Violence: Not At Risk (11/10/2022)   Humiliation, Afraid, Rape, and Kick questionnaire    Fear of Current or Ex-Partner: No    Emotionally Abused: No    Physically Abused: No    Sexually Abused: No    Review of Systems: See HPI, otherwise negative ROS  Physical Exam: BP (!) 156/72 (BP Location: Right Arm, Patient Position: Sitting, Cuff Size: Normal)   Pulse 66   Temp 97.9 F (36.6 C) (Temporal)   Ht 4\' 11"  (1.499 m)   Wt 122 lb 12.8 oz (55.7 kg)   SpO2 99%   BMI 24.80 kg/m  General:   Somewhat frail elderly appearing lady appears in no acute distress.  Accompanied by her friend.   Lungs:  Clear throughout to auscultation.   No wheezes, crackles, or rhonchi. No acute distress. Heart:  Regular rate and rhythm; no murmurs, clicks, rubs,  or gallops. Abdomen: Non-distended, normal bowel sounds.  Soft and nontender without appreciable mass or hepatosplenomegaly.  Rectal exam: No perianal abnormality.  Lax/poor sphincter tone on DRE.  Poor sphincter tone with voluntary squeeze.    No stool in the rectal vault.  With straining she develops a gross rectal prolapse.   Impression/Plan: 77 year old lady has now developed a prominent intermittent rectal prolapse.  Lifelong constipation.  History of 3 vaginal deliveries.  Patient would benefit from seeing a surgeon for definitive surgical repair.  Intermittent  esophageal dysphagia.  History of a Schatzki's ring.  History of paraesophageal hernia repair in 2019  GERD currently well-controlled on Protonix 40 mg once daily.  Recommendations:  We will refer to Romie Levee.  Hydrocortisone cream with lidocaine.  Dispense 30 g.  Apply to the anorectum 4 times a day as needed.  Refill x 1 as a temporizing measure for control pain until definitive treatment can be obtained  Ongoing intermittent dysphagia.  History of Schatzki's ring and paraesophageal hernia repair previously.  Will plan to see this nice lady back in 2 months to look into her dysphagia symptoms further.  In the interim, she is to continue Protonix 40 mg daily.       Notice: This dictation was prepared with Dragon dictation along with smaller phrase technology. Any transcriptional errors that result from this process are unintentional and may not be corrected upon review.

## 2023-01-05 NOTE — Patient Instructions (Signed)
It was good to see you again today!  You have a prominent rectal prolapse.  Surgical repair is likely to be needed.  We will refer you down to see Romie Levee, a colorectal surgeon in [place].  Prescription [facility].  Hydrocortisone cream with lidocaine.  Dispense 30 g.  Apply to the anorectum 4 times a day as needed.  Refill x 1.  Your swallowing issue needs further evaluation.  We will plan to see you back in the office in 8 weeks after you have seen Dr. [name].

## 2023-01-11 ENCOUNTER — Ambulatory Visit: Payer: Self-pay | Admitting: General Surgery

## 2023-01-11 NOTE — H&P (Signed)
REFERRING PHYSICIAN:  Corbin Ade, MD  PROVIDER:  Elenora Gamma, MD  MRN: [id number] DOB: [birth year] DATE OF ENCOUNTER: 01/11/2023  Subjective   Chief Complaint: New Consultation     History of Present Illness: Heidi Fuentes is a 77 y.o. female who is seen today as an office consultation at the request of Dr. Jena Gauss for evaluation of New Consultation .  77 year old female here for rectal prolapse.  She has a lifelong history of constipation.  She fell and fractured her ankle several weeks ago and appears to develop some constipation requiring straining and this prompted her prolapse.  She states that it used to go back in on its own but now it stays prolapsed.  Bowel movements have been less difficult recently.  Urination is a problem as well.  Last colonoscopy was June 2023.   Review of Systems: A complete review of systems was obtained from the patient.  I have reviewed this information and discussed as appropriate with the patient.  See HPI as well for other ROS.    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), wet (CMS-HCC)    Asthma, unspecified asthma severity, unspecified whether complicated, unspecified whether persistent    Glaucoma    Hypertension    Hypothyroidism     Patient Active Problem List  Diagnosis   Hypothyroid   Prolonged Q-T interval on ECG   S/P repair of paraesophageal hernia   Schatzki's ring   Asthma, chronic, unspecified asthma severity, with acute exacerbation (HHS-HCC)   Glaucoma   Iron deficiency anemia, unspecified   Lung mass   Leukocytosis   Hiatal hernia   Hand joint pain   Gastroesophageal reflux disease without esophagitis   Essential hypertension   Dysphagia   Chronic narcotic use   Chronic rhinitis   Chronic pain syndrome   Bipolar disorder (CMS/HHS-HCC)   Acquired hallux rigidus   Hammer toe   Closed fracture of left orbital floor (CMS/HHS-HCC)   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, high risk, bilateral   Exudative age-related macular degeneration, bilateral, with active choroidal neovascularization (CMS/HHS-HCC)   Macular hemorrhage, right    Past Surgical History:  Procedure Laterality Date   cataract surgery OU - 2019     hemroidectomy     laser in OS for AMD     TONSILLECTOMY       Allergies  Allergen Reactions   Aripiprazole Other (See Comments)    Increases cholesterol level    Sulfa (Sulfonamide Antibiotics) Rash   Quetiapine Other (See Comments)   Trazodone Other (See Comments)    "weird dreams"    Ziprasidone Hcl Other (See Comments)   Sulfasalazine Rash    Current Outpatient Medications on File Prior to Visit  Medication Sig Dispense Refill   acetaminophen (TYLENOL) 500 MG tablet 2 tablets for severe pain Orally every 8 hours for 30 days     BREZTRI AEROSPHERE 160-9-4.8 mcg/actuation inhaler 1 Puff     cetirizine (ZYRTEC) 5 MG tablet 1 tablet at bedtime Orally Once a day for 90 days     diphenhydrAMINE (BENADRYL) 25 mg capsule Take 25 mg by mouth     metoprolol TARTrate (LOPRESSOR) 25 MG tablet 1/2 tablet with food Orally Twice a day for 90 days     oxyCODONE (OXYIR) 10 mg immediate release tablet 1 tablet as needed Orally every 8 hours for 30 days     pantoprazole (PROTONIX) 40 MG DR tablet 1 tablet 1/2  to 1 hour before morning meal Orally Once a day for 90 days     potassium chloride (KLOR-CON) 10 mEq ER tablet potassium chloride ER 10 mEq tablet,extended release(part/cryst)     SYMBICORT 160-4.5 mcg/actuation inhaler Inhale 2 Inhalations into the lungs 2 (two) times daily     VENTOLIN HFA 90 mcg/actuation inhaler 1 Puff     carBAMazepine (TEGRETOL) 200 mg tablet carbamazepine 200 mg tablet (Patient not taking: Reported on 01/11/2023)     diltiazem (CARDIZEM) 30 MG tablet diltiazem 30 mg tablet (Patient not taking: Reported on 01/11/2023)     FLUoxetine (PROZAC) 40 MG capsule  (Patient not taking: Reported on 01/11/2023)      fluticasone propionate (FLONASE) 50 mcg/actuation nasal spray fluticasone propionate 50 mcg/actuation nasal spray,suspension (Patient not taking: Reported on 01/11/2023)     FUROsemide (LASIX) 40 MG tablet furosemide 40 mg tablet (Patient not taking: Reported on 01/11/2023)     gabapentin (NEURONTIN) 100 MG capsule gabapentin 100 mg capsule (Patient not taking: Reported on 01/11/2023)     hydrocortisone 1 % cream 1 Application (Patient not taking: Reported on 01/11/2023)     lamoTRIgine (LAMICTAL) 200 MG tablet  (Patient not taking: Reported on 01/11/2023)     latanoprost, PF, 0.005 % Drop 1 drop OU Ophthalmic Once a day (Patient not taking: Reported on 01/11/2023)     levothyroxine (SYNTHROID) 50 MCG tablet levothyroxine 50 mcg tablet (Patient not taking: Reported on 01/11/2023)     lidocaine HCl-hydrocortison ac 3-0.5 % Crea Apply pea size amount to anorectum 4 times a day as needed (Patient not taking: Reported on 01/11/2023)     losartan (COZAAR) 50 MG tablet losartan 50 mg tablet (Patient not taking: Reported on 01/11/2023)     meclizine (ANTIVERT) 25 mg tablet meclizine 25 mg tablet (Patient not taking: Reported on 01/11/2023)     montelukast (SINGULAIR) 10 mg tablet  (Patient not taking: Reported on 01/11/2023)     OLANZapine (ZYPREXA) 10 MG tablet olanzapine 10 mg tablet (Patient not taking: Reported on 01/11/2023)     tiZANidine (ZANAFLEX) 4 MG tablet  (Patient not taking: Reported on 01/11/2023)     No current facility-administered medications on file prior to visit.    Family History  Problem Relation Age of Onset   Glaucoma Mother    Vision loss Neg Hx    Blindness Neg Hx    Amblyopia Neg Hx    Strabismus Neg Hx    Macular degeneration Neg Hx      Social History   Tobacco Use  Smoking Status Never  Smokeless Tobacco Never     Social History   Socioeconomic History   Marital status: Divorced  Tobacco Use   Smoking status: Never   Smokeless tobacco: Never  Substance and Sexual  Activity   Alcohol use: Never   Drug use: Never   Social Drivers of Health   Food Insecurity: No Food Insecurity (11/10/2022)   Received from [facility]   Hunger Vital Sign    Worried About Running Out of Food in the Last Year: Never true    Ran Out of Food in the Last Year: Never true  Transportation Needs: No Transportation Needs (11/10/2022)   Received from [facility]    Lack of Transportation (Medical): No    Lack of Transportation (Non-Medical): No    Objective:    Vitals:   01/11/23 1354  PainSc:   2  PainLoc: Rectum  Exam Gen: NAD Abd: soft Rectal: Consistent with chronic prolapse approximately 10 cm circumferential.  Reduces but immediately prolapses back out.  No ischemia noted.   Labs, Imaging and Diagnostic Testing:  Procedure: Anoscopy Surgeon: [name] After the risks and benefits were explained, written consent was obtained for above procedure.  A medical assistant chaperone was present thoroughout the entire procedure.  Anesthesia: none Diagnosis: rectal prolapse Findings: No masses noted.  Assessment and Plan:  There are no diagnoses linked to this encounter.   77 year old female with chronic rectal prolapse.  We discussed performing a robotic assisted rectopexy.  We discussed the significant side effects of this surgery which are fecal incontinence and constipation.  We discussed the need for daily MiraLAX for the first 30 days after surgery.  We discussed other risk which include bleeding, urinary retention infection and damage to adjacent structures.  All questions were answered.  Patient would like to have surgery as soon as possible.    Vanita Panda, MD Colon and Rectal Surgery [facility]

## 2023-02-16 NOTE — Patient Instructions (Addendum)
 SURGICAL WAITING ROOM VISITATION Patients having surgery or a procedure may have no more than 2 support people in the waiting area - these visitors may rotate in the visitor waiting room.   Due to an increase in RSV and influenza rates and associated hospitalizations, children ages 6 and under may not visit patients in [facility] hospitals. If the patient needs to stay at the hospital during part of their recovery, the visitor guidelines for inpatient rooms apply.  PRE-OP VISITATION  Pre-op nurse will coordinate an appropriate time for 1 support person to accompany the patient in pre-op.  This support person may not rotate.  This visitor will be contacted when the time is appropriate for the visitor to come back in the pre-op area.  Please refer to the [facility] website for the visitor guidelines for Inpatients (after your surgery is over and you are in a regular room).  You are not required to quarantine at this time prior to your surgery. However, you must do this: Hand Hygiene often Do NOT share personal items Notify your provider if you are in close contact with someone who has COVID or you develop fever 100.4 or greater, new onset of sneezing, cough, sore throat, shortness of breath or body aches.  If you test positive for Covid or have been in contact with anyone that has tested positive in the last 10 days please notify you surgeon.    Your procedure is scheduled on:  Wednesday  February 24, 2023  Report to [facility] Main Entrance: [address] entrance where the [employer] is available.   Report to admitting at:  06:15   AM  Call this number if you have any questions or problems the morning of surgery [phone]  FOLLOW ANY ADDITIONAL PRE OP INSTRUCTIONS YOU RECEIVED FROM YOUR SURGEON'S OFFICE!!!  DRINK two (2) bottles of Pre-Surgery Clear Ensure or G2 drink starting at 6:00 pm the evening prior to your surgery to help prevent dehydration. Increase drinking clear  fluids (see list below)          Do not eat food after Midnight the night prior to your surgery/procedure.  After Midnight you may have the following liquids until   05:30 AM DAY OF SURGERY  Clear Liquid Diet Water  Black Coffee (sugar ok, NO MILK/CREAM OR CREAMERS)  Tea (sugar ok, NO MILK/CREAM OR CREAMERS) regular and decaf                             Plain Jell-O  with no fruit (NO RED)                                           Fruit ices (not with fruit pulp, NO RED)                                     Popsicles (NO RED)                                                                  Juice:  NO CITRUS JUICES: only apple, WHITE grape, WHITE cranberry Sports drinks like Gatorade or Powerade (NO RED)                   The day of surgery:  Drink ONE (1) Pre-Surgery Clear Ensure at  05:30 AM the morning of surgery. Drink in one sitting. Do not sip.  This drink was given to you during your hospital pre-op appointment visit. Nothing else to drink after completing the Pre-Surgery Clear Ensure : No candy, chewing gum or throat lozenges.      Oral Hygiene is also important to reduce your risk of infection.        Remember - BRUSH YOUR TEETH THE MORNING OF SURGERY WITH YOUR REGULAR TOOTHPASTE  Do NOT smoke after Midnight the night before surgery.   STOP TAKING all Vitamins, Herbs and supplements 1 week before your surgery.   Take ONLY these medicines the morning of surgery with A SIP OF WATER : Pantoprazole , levothyroxine , metoprolol , bupropion , Lamictal  and gabapentin ,   You may use your Eye drops. You may use your inhalers if needed.   You may take EITHER Tylenol  OR Oxycodone  depending on your pain level.                   You may not have any metal on your body including hair pins, jewelry, and body piercing  Do not wear make-up, lotions, powders, perfumes or deodorant  Do not wear nail polish including gel and S&S, artificial / acrylic nails, or any other type of covering on  natural nails including finger and toenails. If you have artificial nails, gel coating, etc., that needs to be removed by a nail salon, Please have this removed prior to surgery. Not doing so may mean that your surgery could be cancelled or delayed if the Surgeon or anesthesia staff feels like they are unable to monitor you safely.   Do not shave 48 hours prior to surgery to avoid nicks in your skin which may contribute to postoperative infections.   Contacts, Hearing Aids, dentures or bridgework may not be worn into surgery. DENTURES WILL BE REMOVED PRIOR TO SURGERY PLEASE DO NOT APPLY "Poly grip" OR ADHESIVES!!!  You may bring a small overnight bag with you on the day of surgery, only pack items that are not valuable. [facility] IS NOT RESPONSIBLE   FOR VALUABLES THAT ARE LOST OR STOLEN.   Do not bring your home medications to the hospital. The Pharmacy will dispense medications listed on your medication list to you during your admission in the Hospital.  Please read over the following fact sheets you were given: IF YOU HAVE QUESTIONS ABOUT YOUR PRE-OP INSTRUCTIONS, PLEASE CALL [phone]   [facility] - Preparing for Surgery Before surgery, you can play an important role.  Because skin is not sterile, your skin needs to be as free of germs as possible.  You can reduce the number of germs on your skin by washing with CHG (chlorahexidine gluconate) soap before surgery.  CHG is an antiseptic cleaner which kills germs and bonds with the skin to continue killing germs even after washing. Please DO NOT use if you have an allergy to CHG or antibacterial soaps.  If your skin becomes reddened/irritated stop using the CHG and inform your nurse when you arrive at Short Stay. Do not shave (including legs and underarms) for at least 48 hours prior to the first CHG shower.  You may shave your face/neck.  Please follow  these instructions carefully:  1.  Shower with CHG Soap the night before surgery and the   morning of surgery.  2.  If you choose to wash your hair, wash your hair first as usual with your normal  shampoo.  3.  After you shampoo, rinse your hair and body thoroughly to remove the shampoo.                             4.  Use CHG as you would any other liquid soap.  You can apply chg directly to the skin and wash.  Gently with a scrungie or clean washcloth.  5.  Apply the CHG Soap to your body ONLY FROM THE NECK DOWN.   Do not use on face/ open                           Wound or open sores. Avoid contact with eyes, ears mouth and genitals (private parts).                       Wash face,  Genitals (private parts) with your normal soap.             6.  Wash thoroughly, paying special attention to the area where your  surgery  will be performed.  7.  Thoroughly rinse your body with warm water  from the neck down.  8.  DO NOT shower/wash with your normal soap after using and rinsing off the CHG Soap.            9.  Pat yourself dry with a clean towel.            10.  Wear clean pajamas.            11.  Place clean sheets on your bed the night of your first shower and do not  sleep with pets.  ON THE DAY OF SURGERY : Do not apply any lotions/deodorants the morning of surgery.  Please wear clean clothes to the hospital/surgery center.     FAILURE TO FOLLOW THESE INSTRUCTIONS MAY RESULT IN THE CANCELLATION OF YOUR SURGERY  PATIENT SIGNATURE_________________________________  NURSE SIGNATURE__________________________________  ________________________________________________________________________      Heidi Fuentes    An incentive spirometer is a tool that can help keep your lungs clear and active. This tool measures how well you are filling your lungs with each breath. Taking long deep breaths may help reverse or decrease the chance of developing breathing (pulmonary) problems (especially infection) following: A long period of time when you are unable to move or be  active. BEFORE THE PROCEDURE  If the spirometer includes an indicator to show your best effort, your nurse or respiratory therapist will set it to a desired goal. If possible, sit up straight or lean slightly forward. Try not to slouch. Hold the incentive spirometer in an upright position. INSTRUCTIONS FOR USE  Sit on the edge of your bed if possible, or sit up as far as you can in bed or on a chair. Hold the incentive spirometer in an upright position. Breathe out normally. Place the mouthpiece in your mouth and seal your lips tightly around it. Breathe in slowly and as deeply as possible, raising the piston or the ball toward the top of the column. Hold your breath for 3-5 seconds or for as long as possible. Allow the piston or ball to  fall to the bottom of the column. Remove the mouthpiece from your mouth and breathe out normally. Rest for a few seconds and repeat Steps 1 through 7 at least 10 times every 1-2 hours when you are awake. Take your time and take a few normal breaths between deep breaths. The spirometer may include an indicator to show your best effort. Use the indicator as a goal to work toward during each repetition. After each set of 10 deep breaths, practice coughing to be sure your lungs are clear. If you have an incision (the cut made at the time of surgery), support your incision when coughing by placing a pillow or rolled up towels firmly against it. Once you are able to get out of bed, walk around indoors and cough well. You may stop using the incentive spirometer when instructed by your caregiver.  RISKS AND COMPLICATIONS Take your time so you do not get dizzy or light-headed. If you are in pain, you may need to take or ask for pain medication before doing incentive spirometry. It is harder to take a deep breath if you are having pain. AFTER USE Rest and breathe slowly and easily. It can be helpful to keep track of a log of your progress. Your caregiver can provide you  with a simple table to help with this. If you are using the spirometer at home, follow these instructions: SEEK MEDICAL CARE IF:  You are having difficultly using the spirometer. You have trouble using the spirometer as often as instructed. Your pain medication is not giving enough relief while using the spirometer. You develop fever of 100.5 F (38.1 C) or higher.                                                                                                    SEEK IMMEDIATE MEDICAL CARE IF:  You cough up bloody sputum that had not been present before. You develop fever of 102 F (38.9 C) or greater. You develop worsening pain at or near the incision site. MAKE SURE YOU:  Understand these instructions. Will watch your condition. Will get help right away if you are not doing well or get worse. Document Released: 06/01/2006 Document Revised: 04/13/2011 Document Reviewed: 08/02/2006 [facility] Patient Information 2014 [place], [place].

## 2023-02-16 NOTE — Progress Notes (Addendum)
 COVID Vaccine received:  []  No [x]  Yes Date of any COVID positive Test in last 90 days:  None  PCP - [employer], DO   [facility]  Cardiologist - none  Chest x-ray - 07-04-2022  1v  Epic EKG - 07-20-2022  Epic  Stress Test -  ECHO - 09-13-2019  Epic Cardiac Cath -  PFTs - 06-03-2022  Epic  PCR screen: [x]  Ordered & Completed  Hx MRSA in 2020. []   No Order but Needs PROFEND     []   N/A for this surgery  Surgery Plan:  []  Ambulatory   []  Outpatient in bed  [x]  Admit Anesthesia:    [x]  General  []  Spinal  []   Choice []   MAC  Bowel Prep - [x]  No  []   Yes _  Only ordered 3 ensures.  No prep required for rectopexy per Cornelius Dill and Dr Andy Bannister' nurse.   Pacemaker / ICD device [x]  No []  Yes   Spinal Cord Stimulator:[x]  No []  Yes       History of Sleep Apnea? [x]  No []  Yes   CPAP used?- [x]  No []  Yes    Does the patient monitor blood sugar?   [x]  N/A   []  No []  Yes  Patient has: [x]  NO Hx DM   []  Pre-DM   []  DM1  []   DM2  Blood Thinner / Instructions:  none Aspirin  Instructions:   none  ERAS Protocol Ordered: []  No  [x]  Yes PRE-SURGERY [x]  ENSURE x3  Patient is to be NPO after: 0530  Dental hx: [x]  Dentures:  full set dentures []  N/A      []  Bridge or Partial:                   []  Loose or Damaged teeth:   Activity level: Patient is unable to climb a flight of stairs without difficulty.  Patient can perform ADLs without assistance.   Anesthesia review: HTN, CPS- long term opiates, GERD, asthma, Bipolar 1- depression, Hx Rhabdo,  HOH-lost her HAs.    Patient denies shortness of breath, fever, cough and chest pain at PAT appointment.  Patient verbalized understanding and agreement to the Pre-Surgical Instructions that were given to them at this PAT appointment. Patient was also educated of the need to review these PAT instructions again prior to her surgery.I reviewed the appropriate phone numbers to call if they have any and questions or concerns.

## 2023-02-17 ENCOUNTER — Encounter (HOSPITAL_COMMUNITY): Payer: Self-pay

## 2023-02-17 ENCOUNTER — Encounter (HOSPITAL_COMMUNITY)
Admission: RE | Admit: 2023-02-17 | Discharge: 2023-02-17 | Disposition: A | Discharge status: Home / Self Care | Payer: Medicare Other | Source: Ambulatory Visit | Attending: General Surgery | Admitting: General Surgery

## 2023-02-17 ENCOUNTER — Other Ambulatory Visit: Payer: Self-pay

## 2023-02-17 VITALS — BP 117/52 | HR 68 | Temp 99.1°F | Resp 14 | Ht 59.0 in | Wt 118.0 lb

## 2023-02-17 DIAGNOSIS — Z79891 Long term (current) use of opiate analgesic: Secondary | ICD-10-CM | POA: Insufficient documentation

## 2023-02-17 DIAGNOSIS — D509 Iron deficiency anemia, unspecified: Secondary | ICD-10-CM | POA: Insufficient documentation

## 2023-02-17 DIAGNOSIS — Z8614 Personal history of Methicillin resistant Staphylococcus aureus infection: Secondary | ICD-10-CM | POA: Insufficient documentation

## 2023-02-17 DIAGNOSIS — Z01812 Encounter for preprocedural laboratory examination: Secondary | ICD-10-CM | POA: Diagnosis present

## 2023-02-17 DIAGNOSIS — Z01818 Encounter for other preprocedural examination: Secondary | ICD-10-CM

## 2023-02-17 HISTORY — DX: Pneumonia, unspecified organism: J18.9

## 2023-02-17 HISTORY — DX: Bipolar disorder, unspecified: F31.9

## 2023-02-17 LAB — COMPREHENSIVE METABOLIC PANEL
ALT: 14 U/L (ref 0–44)
AST: 22 U/L (ref 15–41)
Albumin: 3.6 g/dL (ref 3.5–5.0)
Alkaline Phosphatase: 58 U/L (ref 38–126)
Anion gap: 11 (ref 5–15)
BUN: 19 mg/dL (ref 8–23)
CO2: 21 mmol/L — ABNORMAL LOW (ref 22–32)
Calcium: 8.8 mg/dL — ABNORMAL LOW (ref 8.9–10.3)
Chloride: 104 mmol/L (ref 98–111)
Creatinine, Ser: 0.64 mg/dL (ref 0.44–1.00)
GFR, Estimated: >60 mL/min (ref >60)
Glucose, Bld: 96 mg/dL (ref 70–99)
Potassium: 3.9 mmol/L (ref 3.5–5.1)
Sodium: 136 mmol/L (ref 135–145)
Total Bilirubin: 0.4 mg/dL (ref 0.0–1.2)
Total Protein: 6.3 g/dL — ABNORMAL LOW (ref 6.5–8.1)

## 2023-02-17 LAB — CBC
HCT: 42.7 % (ref 36.0–46.0)
Hemoglobin: 12.6 g/dL (ref 12.0–15.0)
MCH: 31.5 pg (ref 26.0–34.0)
MCHC: 29.5 g/dL — ABNORMAL LOW (ref 30.0–36.0)
MCV: 106.8 fL — ABNORMAL HIGH (ref 80.0–100.0)
Platelets: 294 10*3/uL (ref 150–400)
RBC: 4 MIL/uL (ref 3.87–5.11)
RDW: 15.1 % (ref 11.5–15.5)
WBC: 9.7 10*3/uL (ref 4.0–10.5)
nRBC: 0 % (ref 0.0–0.2)

## 2023-02-17 LAB — SURGICAL PCR SCREEN
MRSA, PCR: NEGATIVE
Staphylococcus aureus: NEGATIVE

## 2023-02-24 ENCOUNTER — Encounter (HOSPITAL_COMMUNITY)
Admission: RE | Disposition: A | Discharge status: Home / Self Care | Payer: Self-pay | Source: Home / Self Care | Attending: General Surgery

## 2023-02-24 ENCOUNTER — Inpatient Hospital Stay (HOSPITAL_COMMUNITY): Payer: Self-pay | Admitting: Anesthesiology

## 2023-02-24 ENCOUNTER — Encounter (HOSPITAL_COMMUNITY): Payer: Self-pay | Admitting: General Surgery

## 2023-02-24 ENCOUNTER — Inpatient Hospital Stay (HOSPITAL_COMMUNITY)
Admission: RE | Admit: 2023-02-24 | Discharge: 2023-02-28 | DRG: 331 | Disposition: A | Discharge status: Home / Self Care | Payer: Medicare Other | Attending: General Surgery | Admitting: General Surgery

## 2023-02-24 ENCOUNTER — Other Ambulatory Visit: Payer: Self-pay

## 2023-02-24 ENCOUNTER — Inpatient Hospital Stay (HOSPITAL_COMMUNITY): Payer: Medicare Other | Admitting: Anesthesiology

## 2023-02-24 DIAGNOSIS — D509 Iron deficiency anemia, unspecified: Secondary | ICD-10-CM | POA: Diagnosis present

## 2023-02-24 DIAGNOSIS — K623 Rectal prolapse: Secondary | ICD-10-CM | POA: Diagnosis present

## 2023-02-24 DIAGNOSIS — H409 Unspecified glaucoma: Secondary | ICD-10-CM | POA: Diagnosis present

## 2023-02-24 DIAGNOSIS — I10 Essential (primary) hypertension: Secondary | ICD-10-CM | POA: Diagnosis present

## 2023-02-24 DIAGNOSIS — E039 Hypothyroidism, unspecified: Secondary | ICD-10-CM | POA: Diagnosis present

## 2023-02-24 DIAGNOSIS — H353231 Exudative age-related macular degeneration, bilateral, with active choroidal neovascularization: Secondary | ICD-10-CM | POA: Diagnosis present

## 2023-02-24 DIAGNOSIS — J45909 Unspecified asthma, uncomplicated: Secondary | ICD-10-CM | POA: Diagnosis present

## 2023-02-24 DIAGNOSIS — G894 Chronic pain syndrome: Secondary | ICD-10-CM | POA: Diagnosis present

## 2023-02-24 DIAGNOSIS — K219 Gastro-esophageal reflux disease without esophagitis: Secondary | ICD-10-CM | POA: Diagnosis present

## 2023-02-24 DIAGNOSIS — Z882 Allergy status to sulfonamides status: Secondary | ICD-10-CM | POA: Diagnosis not present

## 2023-02-24 DIAGNOSIS — F319 Bipolar disorder, unspecified: Secondary | ICD-10-CM | POA: Diagnosis present

## 2023-02-24 DIAGNOSIS — Z83511 Family history of glaucoma: Secondary | ICD-10-CM

## 2023-02-24 DIAGNOSIS — Z888 Allergy status to other drugs, medicaments and biological substances status: Secondary | ICD-10-CM | POA: Diagnosis not present

## 2023-02-24 DIAGNOSIS — Z7951 Long term (current) use of inhaled steroids: Secondary | ICD-10-CM | POA: Diagnosis not present

## 2023-02-24 DIAGNOSIS — Z79899 Other long term (current) drug therapy: Secondary | ICD-10-CM

## 2023-02-24 HISTORY — PX: XI ROBOT ASSISTED RECTOPEXY: SHX6788

## 2023-02-24 LAB — GLUCOSE, CAPILLARY: Glucose-Capillary: 128 mg/dL — ABNORMAL HIGH (ref 70–99)

## 2023-02-24 SURGERY — RECTOPEXY, ROBOT-ASSISTED
Anesthesia: General

## 2023-02-24 MED ORDER — EPHEDRINE SULFATE-NACL 50-0.9 MG/10ML-% IV SOSY
PREFILLED_SYRINGE | INTRAVENOUS | Status: DC | PRN
  Administered 2023-02-24 (×2): 10 mg via INTRAVENOUS

## 2023-02-24 MED ORDER — BUPROPION HCL 75 MG PO TABS
75.0000 mg | ORAL_TABLET | Freq: Two times a day (BID) | ORAL | Status: DC
  Administered 2023-02-24 – 2023-02-28 (×8): 75 mg via ORAL
  Filled 2023-02-24 (×8): qty 1

## 2023-02-24 MED ORDER — MOMETASONE FURO-FORMOTEROL FUM 200-5 MCG/ACT IN AERO: 2.0000 | INHALATION_SPRAY | Freq: Two times a day (BID) | RESPIRATORY_TRACT | Status: DC

## 2023-02-24 MED ORDER — PHENYLEPHRINE HCL-NACL 20-0.9 MG/250ML-% IV SOLN
INTRAVENOUS | Status: DC | PRN
  Administered 2023-02-24: 50 ug/min via INTRAVENOUS

## 2023-02-24 MED ORDER — ONDANSETRON HCL 4 MG/2ML IJ SOLN: 4.0000 mg | Freq: Once | INTRAMUSCULAR | Status: DC | PRN

## 2023-02-24 MED ORDER — DIPHENHYDRAMINE HCL 12.5 MG/5ML PO ELIX: 12.5000 mg | ORAL_SOLUTION | Freq: Four times a day (QID) | ORAL | Status: DC | PRN

## 2023-02-24 MED ORDER — ONDANSETRON HCL 4 MG/2ML IJ SOLN
INTRAMUSCULAR | Status: AC
  Filled 2023-02-24: qty 2

## 2023-02-24 MED ORDER — FENTANYL CITRATE (PF) 100 MCG/2ML IJ SOLN
INTRAMUSCULAR | Status: AC
  Filled 2023-02-24: qty 2

## 2023-02-24 MED ORDER — ORAL CARE MOUTH RINSE: 15.0000 mL | Freq: Once | OROMUCOSAL | Status: AC

## 2023-02-24 MED ORDER — LATANOPROST 0.005 % OP SOLN
1.0000 [drp] | Freq: Every day | OPHTHALMIC | Status: DC
  Administered 2023-02-24 – 2023-02-27 (×4): 1 [drp] via OPHTHALMIC
  Filled 2023-02-24: qty 2.5

## 2023-02-24 MED ORDER — ONDANSETRON HCL 4 MG/2ML IJ SOLN
INTRAMUSCULAR | Status: DC | PRN
  Administered 2023-02-24: 4 mg via INTRAVENOUS

## 2023-02-24 MED ORDER — LACTATED RINGERS IV SOLN: INTRAVENOUS | Status: DC

## 2023-02-24 MED ORDER — ACETAMINOPHEN 500 MG PO TABS
500.0000 mg | ORAL_TABLET | Freq: Four times a day (QID) | ORAL | Status: DC | PRN
  Administered 2023-02-26 – 2023-02-28 (×4): 500 mg via ORAL
  Filled 2023-02-24 (×4): qty 1

## 2023-02-24 MED ORDER — FENTANYL CITRATE (PF) 100 MCG/2ML IJ SOLN
INTRAMUSCULAR | Status: DC | PRN
  Administered 2023-02-24: 50 ug via INTRAVENOUS
  Administered 2023-02-24 (×2): 25 ug via INTRAVENOUS
  Administered 2023-02-24 (×2): 50 ug via INTRAVENOUS

## 2023-02-24 MED ORDER — PROPOFOL 10 MG/ML IV BOLUS
INTRAVENOUS | Status: DC | PRN
  Administered 2023-02-24: 100 mg via INTRAVENOUS

## 2023-02-24 MED ORDER — BUPIVACAINE LIPOSOME 1.3 % IJ SUSP
INTRAMUSCULAR | Status: AC
  Filled 2023-02-24: qty 20

## 2023-02-24 MED ORDER — FENTANYL CITRATE PF 50 MCG/ML IJ SOSY
25.0000 ug | PREFILLED_SYRINGE | INTRAMUSCULAR | Status: DC | PRN
Start: 2023-02-24 — End: 2023-02-24
  Administered 2023-02-24: 25 ug via INTRAVENOUS

## 2023-02-24 MED ORDER — 0.9 % SODIUM CHLORIDE (POUR BTL) OPTIME
TOPICAL | Status: DC | PRN
  Administered 2023-02-24: 1000 mL

## 2023-02-24 MED ORDER — BUPIVACAINE LIPOSOME 1.3 % IJ SUSP
INTRAMUSCULAR | Status: DC | PRN
  Administered 2023-02-24: 20 mL

## 2023-02-24 MED ORDER — KETOCONAZOLE 2 % EX CREA: 1.0000 | TOPICAL_CREAM | Freq: Every day | CUTANEOUS | Status: DC | PRN

## 2023-02-24 MED ORDER — STERILE WATER FOR IRRIGATION IR SOLN
Status: DC | PRN
  Administered 2023-02-24: 1000 mL

## 2023-02-24 MED ORDER — ENSURE PRE-SURGERY PO LIQD: 296.0000 mL | Freq: Once | ORAL | Status: DC

## 2023-02-24 MED ORDER — BUPIVACAINE-EPINEPHRINE 0.25% -1:200000 IJ SOLN
INTRAMUSCULAR | Status: DC | PRN
  Administered 2023-02-24: 30 mL

## 2023-02-24 MED ORDER — ALVIMOPAN 12 MG PO CAPS: 12.0000 mg | ORAL_CAPSULE | Freq: Two times a day (BID) | ORAL | Status: DC

## 2023-02-24 MED ORDER — SODIUM CHLORIDE 0.9 % IV SOLN
2.0000 g | INTRAVENOUS | Status: AC
  Administered 2023-02-24: 2 g via INTRAVENOUS
  Filled 2023-02-24: qty 2

## 2023-02-24 MED ORDER — SIMETHICONE 80 MG PO CHEW
40.0000 mg | CHEWABLE_TABLET | Freq: Four times a day (QID) | ORAL | Status: DC | PRN
  Administered 2023-02-25 (×2): 40 mg via ORAL
  Filled 2023-02-24 (×2): qty 1

## 2023-02-24 MED ORDER — SODIUM CHLORIDE 0.9 % IV SOLN
2.0000 g | Freq: Two times a day (BID) | INTRAVENOUS | Status: AC
  Administered 2023-02-24: 2 g via INTRAVENOUS
  Filled 2023-02-24: qty 2

## 2023-02-24 MED ORDER — ENSURE SURGERY PO LIQD
237.0000 mL | Freq: Two times a day (BID) | ORAL | Status: DC
  Administered 2023-02-24 – 2023-02-25 (×2): 237 mL via ORAL

## 2023-02-24 MED ORDER — BUPIVACAINE LIPOSOME 1.3 % IJ SUSP: 20.0000 mL | Freq: Once | INTRAMUSCULAR | Status: DC

## 2023-02-24 MED ORDER — MELATONIN 5 MG PO TABS
5.0000 mg | ORAL_TABLET | Freq: Every day | ORAL | Status: DC
  Administered 2023-02-24 – 2023-02-27 (×4): 5 mg via ORAL
  Filled 2023-02-24 (×4): qty 1

## 2023-02-24 MED ORDER — LIDOCAINE 2% (20 MG/ML) 5 ML SYRINGE
INTRAMUSCULAR | Status: DC | PRN
  Administered 2023-02-24: 60 mg via INTRAVENOUS
  Administered 2023-02-24: 1 mg/kg/h via INTRAVENOUS

## 2023-02-24 MED ORDER — FLUOXETINE HCL 20 MG PO CAPS
40.0000 mg | ORAL_CAPSULE | Freq: Every day | ORAL | Status: DC
  Administered 2023-02-24 – 2023-02-28 (×5): 40 mg via ORAL
  Filled 2023-02-24 (×5): qty 2

## 2023-02-24 MED ORDER — FENTANYL CITRATE PF 50 MCG/ML IJ SOSY
PREFILLED_SYRINGE | INTRAMUSCULAR | Status: AC
  Administered 2023-02-24: 25 ug via INTRAVENOUS
  Filled 2023-02-24: qty 1

## 2023-02-24 MED ORDER — GABAPENTIN 400 MG PO CAPS
600.0000 mg | ORAL_CAPSULE | Freq: Four times a day (QID) | ORAL | Status: DC
  Administered 2023-02-24 – 2023-02-28 (×16): 600 mg via ORAL
  Filled 2023-02-24 (×16): qty 2

## 2023-02-24 MED ORDER — ROCURONIUM BROMIDE 10 MG/ML (PF) SYRINGE
PREFILLED_SYRINGE | INTRAVENOUS | Status: DC | PRN
  Administered 2023-02-24: 50 mg via INTRAVENOUS
  Administered 2023-02-24: 10 mg via INTRAVENOUS
  Administered 2023-02-24: 20 mg via INTRAVENOUS

## 2023-02-24 MED ORDER — KCL IN DEXTROSE-NACL 20-5-0.45 MEQ/L-%-% IV SOLN
INTRAVENOUS | Status: DC
  Filled 2023-02-24: qty 1000

## 2023-02-24 MED ORDER — MIDAZOLAM HCL 2 MG/2ML IJ SOLN
INTRAMUSCULAR | Status: AC
  Filled 2023-02-24: qty 2

## 2023-02-24 MED ORDER — ALBUTEROL SULFATE (2.5 MG/3ML) 0.083% IN NEBU: 3.0000 mL | INHALATION_SOLUTION | RESPIRATORY_TRACT | Status: DC | PRN

## 2023-02-24 MED ORDER — CHLORHEXIDINE GLUCONATE CLOTH 2 % EX PADS
6.0000 | MEDICATED_PAD | Freq: Every day | CUTANEOUS | Status: DC
  Administered 2023-02-24 – 2023-02-26 (×3): 6 via TOPICAL

## 2023-02-24 MED ORDER — FLUTICASONE FUROATE-VILANTEROL 200-25 MCG/ACT IN AEPB
1.0000 | INHALATION_SPRAY | Freq: Every day | RESPIRATORY_TRACT | Status: DC
  Administered 2023-02-25 – 2023-02-28 (×4): 1 via RESPIRATORY_TRACT
  Filled 2023-02-24: qty 28

## 2023-02-24 MED ORDER — CHLORHEXIDINE GLUCONATE 0.12 % MT SOLN
15.0000 mL | Freq: Once | OROMUCOSAL | Status: AC
  Administered 2023-02-24: 15 mL via OROMUCOSAL

## 2023-02-24 MED ORDER — MIDAZOLAM HCL 2 MG/2ML IJ SOLN
INTRAMUSCULAR | Status: DC | PRN
  Administered 2023-02-24: 1 mg via INTRAVENOUS

## 2023-02-24 MED ORDER — POLYETHYLENE GLYCOL 3350 17 G PO PACK
17.0000 g | PACK | Freq: Every day | ORAL | Status: DC
  Administered 2023-02-25 – 2023-02-26 (×2): 17 g via ORAL
  Filled 2023-02-24 (×2): qty 1

## 2023-02-24 MED ORDER — DIPHENHYDRAMINE HCL 50 MG/ML IJ SOLN: 12.5000 mg | Freq: Four times a day (QID) | INTRAMUSCULAR | Status: DC | PRN

## 2023-02-24 MED ORDER — LIDOCAINE HCL (PF) 2 % IJ SOLN
INTRAMUSCULAR | Status: AC
  Filled 2023-02-24: qty 5

## 2023-02-24 MED ORDER — DEXAMETHASONE SODIUM PHOSPHATE 10 MG/ML IJ SOLN
INTRAMUSCULAR | Status: AC
  Filled 2023-02-24: qty 1

## 2023-02-24 MED ORDER — PROPOFOL 10 MG/ML IV BOLUS
INTRAVENOUS | Status: AC
  Filled 2023-02-24: qty 20

## 2023-02-24 MED ORDER — ALUM & MAG HYDROXIDE-SIMETH 200-200-20 MG/5ML PO SUSP: 30.0000 mL | Freq: Four times a day (QID) | ORAL | Status: DC | PRN

## 2023-02-24 MED ORDER — FLUTICASONE PROPIONATE 50 MCG/ACT NA SUSP
2.0000 | Freq: Every day | NASAL | Status: DC
  Administered 2023-02-25 – 2023-02-28 (×4): 2 via NASAL
  Filled 2023-02-24: qty 16

## 2023-02-24 MED ORDER — ROCURONIUM BROMIDE 10 MG/ML (PF) SYRINGE
PREFILLED_SYRINGE | INTRAVENOUS | Status: AC
  Filled 2023-02-24: qty 10

## 2023-02-24 MED ORDER — PANTOPRAZOLE SODIUM 20 MG PO TBEC
20.0000 mg | DELAYED_RELEASE_TABLET | Freq: Every day | ORAL | Status: DC
  Administered 2023-02-25 – 2023-02-28 (×4): 20 mg via ORAL
  Filled 2023-02-24 (×4): qty 1

## 2023-02-24 MED ORDER — DEXAMETHASONE SODIUM PHOSPHATE 10 MG/ML IJ SOLN
INTRAMUSCULAR | Status: DC | PRN
  Administered 2023-02-24: 5 mg via INTRAVENOUS

## 2023-02-24 MED ORDER — LEVOTHYROXINE SODIUM 25 MCG PO TABS
25.0000 ug | ORAL_TABLET | Freq: Every day | ORAL | Status: DC
  Administered 2023-02-25 – 2023-02-28 (×4): 25 ug via ORAL
  Filled 2023-02-24 (×4): qty 1

## 2023-02-24 MED ORDER — SUGAMMADEX SODIUM 200 MG/2ML IV SOLN
INTRAVENOUS | Status: DC | PRN
  Administered 2023-02-24: 110 mg via INTRAVENOUS

## 2023-02-24 MED ORDER — UMECLIDINIUM BROMIDE 62.5 MCG/ACT IN AEPB
1.0000 | INHALATION_SPRAY | Freq: Every day | RESPIRATORY_TRACT | Status: DC
  Administered 2023-02-25 – 2023-02-28 (×4): 1 via RESPIRATORY_TRACT
  Filled 2023-02-24: qty 7

## 2023-02-24 MED ORDER — LAMOTRIGINE 100 MG PO TABS
200.0000 mg | ORAL_TABLET | Freq: Two times a day (BID) | ORAL | Status: DC
  Administered 2023-02-24 – 2023-02-28 (×8): 200 mg via ORAL
  Filled 2023-02-24 (×8): qty 2

## 2023-02-24 MED ORDER — BUPIVACAINE-EPINEPHRINE 0.25% -1:200000 IJ SOLN
INTRAMUSCULAR | Status: AC
  Filled 2023-02-24: qty 1

## 2023-02-24 MED ORDER — LOSARTAN POTASSIUM 25 MG PO TABS
25.0000 mg | ORAL_TABLET | Freq: Every day | ORAL | Status: DC
Start: 2023-02-25 — End: 2023-02-28
  Administered 2023-02-25 – 2023-02-28 (×4): 25 mg via ORAL
  Filled 2023-02-24 (×4): qty 1

## 2023-02-24 MED ORDER — ACETAMINOPHEN 500 MG PO TABS
1000.0000 mg | ORAL_TABLET | ORAL | Status: AC
  Administered 2023-02-24: 1000 mg via ORAL
  Filled 2023-02-24: qty 2

## 2023-02-24 MED ORDER — METOPROLOL TARTRATE 12.5 MG HALF TABLET
12.5000 mg | ORAL_TABLET | Freq: Two times a day (BID) | ORAL | Status: DC
  Administered 2023-02-24 – 2023-02-28 (×8): 12.5 mg via ORAL
  Filled 2023-02-24 (×8): qty 1

## 2023-02-24 MED ORDER — MONTELUKAST SODIUM 10 MG PO TABS
10.0000 mg | ORAL_TABLET | Freq: Every day | ORAL | Status: DC
  Administered 2023-02-24 – 2023-02-28 (×5): 10 mg via ORAL
  Filled 2023-02-24 (×5): qty 1

## 2023-02-24 MED ORDER — ONDANSETRON HCL 4 MG/2ML IJ SOLN
4.0000 mg | Freq: Four times a day (QID) | INTRAMUSCULAR | Status: DC | PRN
Start: 2023-02-24 — End: 2023-02-28

## 2023-02-24 MED ORDER — OXYCODONE HCL 5 MG PO TABS
10.0000 mg | ORAL_TABLET | Freq: Three times a day (TID) | ORAL | Status: DC | PRN
  Administered 2023-02-24 – 2023-02-28 (×12): 10 mg via ORAL
  Filled 2023-02-24 (×12): qty 2

## 2023-02-24 MED ORDER — ENSURE PRE-SURGERY PO LIQD: 592.0000 mL | Freq: Once | ORAL | Status: DC

## 2023-02-24 MED ORDER — BUDESON-GLYCOPYRROL-FORMOTEROL 160-9-4.8 MCG/ACT IN AERO: 1.0000 | INHALATION_SPRAY | Freq: Every day | RESPIRATORY_TRACT | Status: DC

## 2023-02-24 MED ORDER — HEPARIN SODIUM (PORCINE) 5000 UNIT/ML IJ SOLN
5000.0000 [IU] | Freq: Once | INTRAMUSCULAR | Status: AC
  Administered 2023-02-24: 5000 [IU] via SUBCUTANEOUS
  Filled 2023-02-24: qty 1

## 2023-02-24 MED ORDER — ENOXAPARIN SODIUM 40 MG/0.4ML IJ SOSY
40.0000 mg | PREFILLED_SYRINGE | INTRAMUSCULAR | Status: DC
  Administered 2023-02-25 – 2023-02-28 (×4): 40 mg via SUBCUTANEOUS
  Filled 2023-02-24 (×4): qty 0.4

## 2023-02-24 MED ORDER — SACCHAROMYCES BOULARDII 250 MG PO CAPS
250.0000 mg | ORAL_CAPSULE | Freq: Two times a day (BID) | ORAL | Status: DC
Start: 2023-02-24 — End: 2023-02-28
  Administered 2023-02-24 – 2023-02-28 (×8): 250 mg via ORAL
  Filled 2023-02-24 (×8): qty 1

## 2023-02-24 MED ORDER — HYDROMORPHONE HCL 1 MG/ML IJ SOLN
0.5000 mg | INTRAMUSCULAR | Status: DC | PRN
Start: 2023-02-24 — End: 2023-02-28
  Administered 2023-02-25: 0.5 mg via INTRAVENOUS
  Filled 2023-02-24: qty 0.5

## 2023-02-24 MED ORDER — ONDANSETRON HCL 4 MG PO TABS: 4.0000 mg | ORAL_TABLET | Freq: Four times a day (QID) | ORAL | Status: DC | PRN

## 2023-02-24 MED ORDER — DIPHENHYDRAMINE HCL 25 MG PO CAPS: 25.0000 mg | ORAL_CAPSULE | Freq: Four times a day (QID) | ORAL | Status: DC | PRN

## 2023-02-24 MED ORDER — LIDOCAINE HCL (PF) 2 % IJ SOLN
INTRAMUSCULAR | Status: AC
  Filled 2023-02-24: qty 10

## 2023-02-24 SURGICAL SUPPLY — 55 items
ANTIFOG SOL W/FOAM PAD STRL (MISCELLANEOUS) ×1
BAG COUNTER SPONGE SURGICOUNT (BAG) ×1 IMPLANT
BLADE SURG 15 STRL LF DISP TIS (BLADE) ×1 IMPLANT
COVER MAYO STAND STRL (DRAPES) ×1 IMPLANT
COVER SURGICAL LIGHT HANDLE (MISCELLANEOUS) ×1 IMPLANT
COVER TIP SHEARS 8 DVNC (MISCELLANEOUS) ×1 IMPLANT
DERMABOND ADVANCED .7 DNX12 (GAUZE/BANDAGES/DRESSINGS) ×2 IMPLANT
DRAIN CHANNEL 10M FLAT 3/4 FLT (DRAIN) IMPLANT
DRAIN CHANNEL 19F RND (DRAIN) IMPLANT
DRAPE ARM DVNC X/XI (DISPOSABLE) ×4 IMPLANT
DRAPE COLUMN DVNC XI (DISPOSABLE) ×1 IMPLANT
DRAPE SURG IRRIG POUCH 19X23 (DRAPES) ×1 IMPLANT
DRIVER NDL LRG 8 DVNC XI (INSTRUMENTS) ×2 IMPLANT
DRIVER NDLE LRG 8 DVNC XI (INSTRUMENTS) ×2
ELECT PENCIL ROCKER SW 15FT (MISCELLANEOUS) IMPLANT
ELECT REM PT RETURN 15FT ADLT (MISCELLANEOUS) ×1 IMPLANT
EVACUATOR SILICONE 100CC (DRAIN) IMPLANT
GLOVE BIO SURGEON STRL SZ 6.5 (GLOVE) ×2 IMPLANT
GLOVE INDICATOR 6.5 STRL GRN (GLOVE) ×3 IMPLANT
GOWN STRL REUS W/ TWL XL LVL3 (GOWN DISPOSABLE) ×2 IMPLANT
GRASPER TIP-UP FEN DVNC XI (INSTRUMENTS) ×1 IMPLANT
HOLDER FOLEY CATH W/STRAP (MISCELLANEOUS) ×1 IMPLANT
IRRIG SUCT STRYKERFLOW 2 WTIP (MISCELLANEOUS) ×1
IRRIGATION SUCT STRKRFLW 2 WTP (MISCELLANEOUS) ×1 IMPLANT
KIT BASIN OR (CUSTOM PROCEDURE TRAY) ×1 IMPLANT
KIT TURNOVER KIT A (KITS) IMPLANT
LEGGING LITHOTOMY PAIR STRL (DRAPES) ×1 IMPLANT
LUBRICANT JELLY K Y 4OZ (MISCELLANEOUS) ×1 IMPLANT
NDL HYPO 22X1.5 SAFETY MO (MISCELLANEOUS) ×1 IMPLANT
NDL INSUFFLATION 14GA 120MM (NEEDLE) ×1 IMPLANT
NEEDLE HYPO 22X1.5 SAFETY MO (MISCELLANEOUS) ×1
NEEDLE INSUFFLATION 14GA 120MM (NEEDLE) ×1
PACK CARDIOVASCULAR III (CUSTOM PROCEDURE TRAY) ×1 IMPLANT
PAD POSITIONING PINK XL (MISCELLANEOUS) ×1 IMPLANT
PROTECTOR NERVE ULNAR (MISCELLANEOUS) ×2 IMPLANT
SCISSORS LAP 5X45 EPIX DISP (ENDOMECHANICALS) ×1 IMPLANT
SCISSORS MNPLR CVD DVNC XI (INSTRUMENTS) ×1 IMPLANT
SEAL UNIV 5-12 XI (MISCELLANEOUS) ×3 IMPLANT
SEALER VESSEL EXT DVNC XI (MISCELLANEOUS) IMPLANT
SOL ELECTROSURG ANTI STICK (MISCELLANEOUS) ×1
SOLUTION ANTFG W/FOAM PAD STRL (MISCELLANEOUS) ×1 IMPLANT
SOLUTION ELECTROSURG ANTI STCK (MISCELLANEOUS) ×1 IMPLANT
SPIKE FLUID TRANSFER (MISCELLANEOUS) ×1 IMPLANT
SUT ETHIBOND 2 0 SH 36X2 (SUTURE) ×1 IMPLANT
SUT ETHILON 2 0 PS N (SUTURE) IMPLANT
SUT VIC AB 4-0 PS2 18 (SUTURE) ×1 IMPLANT
SUT VLOC 180 2-0 6IN GS21 (SUTURE) ×2 IMPLANT
SYR 20ML ECCENTRIC (SYRINGE) ×1 IMPLANT
SYR CONTROL 10ML LL (SYRINGE) ×1 IMPLANT
TOWEL OR 17X26 10 PK STRL BLUE (TOWEL DISPOSABLE) ×1 IMPLANT
TRAY FOLEY MTR SLVR 14FR STAT (SET/KITS/TRAYS/PACK) ×1 IMPLANT
TRAY FOLEY MTR SLVR 16FR STAT (SET/KITS/TRAYS/PACK) ×1 IMPLANT
TROCAR ADV FIXATION 5X100MM (TROCAR) ×1 IMPLANT
TUBING CONNECTING 10 (TUBING) IMPLANT
TUBING INSUFFLATION 10FT LAP (TUBING) ×1 IMPLANT

## 2023-02-24 NOTE — Op Note (Signed)
02/24/2023  10:02 AM  PATIENT:  Heidi Fuentes  78 y.o. female  Patient Care Team: [name], DO as PCP - General (Family Medicine) Rourk, Gerrit Friends, MD as Consulting Physician (Gastroenterology)  PRE-OPERATIVE DIAGNOSIS:  RECTAL PROLAPSE  POST-OPERATIVE DIAGNOSIS:  RECTAL PROLAPSE  PROCEDURE:  XI ROBOT ASSISTED RECTOPEXY   Surgeon(s): Romie Levee, MD Karie Soda, MD  ASSISTANT: Dr [name]   ANESTHESIA:   local and general  EBL:8ml  Total I/O In: -  Out: 275 [Urine:250; Blood:25]  Delay start of Pharmacological VTE agent (>24hrs) due to surgical blood loss or risk of bleeding:  no  DRAINS: (70F) Jackson-Pratt drain(s) with closed bulb suction in the pelvis    SPECIMEN:  Source of Specimen:  none  DISPOSITION OF SPECIMEN:  PATHOLOGY  COUNTS:  YES  PLAN OF CARE: Admit to inpatient   PATIENT DISPOSITION:  PACU - hemodynamically stable.  INDICATION:    78 y.o. F with significant rectal prolapse.  I recommended Rectopexy:  The anatomy & physiology of the digestive tract was discussed.  The pathophysiology was discussed.  Natural history risks without surgery was discussed.   I worked to give an overview of the disease and the frequent need to have multispecialty involvement.  I feel the risks of no intervention will lead to serious problems that outweigh the operative risks.  I recommended proceeding with robotic assisted rectopexy.  Risks such as bleeding, infection, abscess, bowel perforation, reoperation, possible ostomy, hernia, heart attack, death, and other risks were discussed.  I noted a good likelihood this will help address the problem.   Goals of post-operative recovery were discussed as well.    The patient expressed understanding & wished to proceed with surgery.  OR FINDINGS:   Patient had significant rectal prolapse  DESCRIPTION:   Informed consent was confirmed.  The patient underwent general anaesthesia without difficulty.  The patient  was positioned appropriately.  VTE prevention in place.  The patient's abdomen was clipped, prepped, & draped in a sterile fashion.  Surgical timeout confirmed our plan.  The patient was positioned in reverse Trendelenburg.  Abdominal entry was gained using a Varies needle in the LUQ.  Entry was clean.  I induced carbon dioxide insufflation.  An 8mm robotic port was placed in the RUQ.  Camera inspection revealed no injury.  Extra ports were carefully placed under direct laparoscopic visualization.  I laparoscopically reflected the greater omentum and the upper abdomen the small bowel in the upper abdomen. The patient was appropriately positioned and the robot was docked to the patient's left side.  Instruments were placed under direct visualization.     I scored the base of peritoneum of the right side of the mesentery of the left colon from the ligament of Treitz to the peritoneal reflection.  The patient had significant redundant tissues.  I elevated the sigmoid mesentery and enetered into the retro-mesenteric plane. We were able to identify the left ureter and gonadal vessels. We kept those posterior within the retroperitoneum and elevated the left colon mesentery off that.  I continued distally and got into the avascular plane posterior to the mesorectum. This allowed me to help mobilize the rectum as well by freeing the mesorectum off the sacrum.  I mobilized the peritoneal coverings towards the peritoneal reflection on both the right and left sides of the rectum.  I could see the right and left ureters and stayed away from them. I continued my mesorectal dissection to the level of the pelvic  floor.  I continued to mobilize the rectum until the the peritoneal reflection could be attached to the sacral prominence.  I checked the distal rectum digitally to confirm the prolapse was completely reduced and the rectum was taught.  At this point a 2-0 V lock suture was placed in the sacral promontory and  peritoneal reflection.  This allowed for fixation so that the Ethibond sutures could be placed with ease.  I placed 2, 0 Ethibond sutures in the sacral promontory and fixed these to the peritoneal reflection tissues.  I checked the rectum from below again to confirm good reduction.  Sacral area was then irrigated and a 84F blake drain was placed in the pelvis and brought out through the RLQ port site.  This was secured in place with a 2-0 Nylon suture at the skin.  I then closed the peritoneum with the remaining V lock sutures.  I also used the V lock remnant to pexy the sigmoid to the LLQ to avoid it falling into the pelvis after surgery.  At this point, we inspected the abdomen.  All 4 quadrants appeared to be without injury or other pathology.  The robotic instruments were removed.  The port sites were closed with 4-0 Vicryl sutures and dermabond.  The patient was then awakened from anesthesia and sent to the PACU in stable condition.  All counts were correct per OR staff.  An MD assistant was necessary for tissue manipulation, retraction and positioning due to the complexity of the case and hospital policies   Vanita Panda, MD  Colorectal and General Surgery [facility]

## 2023-02-24 NOTE — H&P (Signed)
REFERRING PHYSICIAN:  Corbin Ade, MD   PROVIDER:  Elenora Gamma, MD   MRN: [id number] DOB: 06-20-1945    Subjective    Chief Complaint: New Consultation       History of Present Illness: Heidi Fuentes is a 78 y.o. female who is seen today as an office consultation at the request of Dr. Jena Gauss for evaluation of New Consultation .  78 year old female here for rectal prolapse.  She has a lifelong history of constipation.  She fell and fractured her ankle several weeks ago and appears to develop some constipation requiring straining and this prompted her prolapse.  She states that it used to go back in on its own but now it stays prolapsed.  Bowel movements have been less difficult recently.  Urination is a problem as well.  Last colonoscopy was June 2023.     Review of Systems: A complete review of systems was obtained from the patient.  I have reviewed this information and discussed as appropriate with the patient.  See HPI as well for other ROS.       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), wet (CMS-HCC)     Asthma, unspecified asthma severity, unspecified whether complicated, unspecified whether persistent     Glaucoma     Hypertension     Hypothyroidism           Patient Active Problem List  Diagnosis   Hypothyroid   Prolonged Q-T interval on ECG   S/P repair of paraesophageal hernia   Schatzki's ring   Asthma, chronic, unspecified asthma severity, with acute exacerbation (HHS-HCC)   Glaucoma   Iron deficiency anemia, unspecified   Lung mass   Leukocytosis   Hiatal hernia   Hand joint pain   Gastroesophageal reflux disease without esophagitis   Essential hypertension   Dysphagia   Chronic narcotic use   Chronic rhinitis   Chronic pain syndrome   Bipolar disorder (CMS/HHS-HCC)   Acquired hallux rigidus   Hammer toe   Closed fracture of left orbital floor (CMS/HHS-HCC)   OAG (open  angle glaucoma) suspect, high risk, bilateral   Exudative age-related macular degeneration, bilateral, with active choroidal neovascularization (CMS/HHS-HCC)   Macular hemorrhage, right           Past Surgical History:  Procedure Laterality Date   cataract surgery OU - 2019       hemroidectomy       laser in OS for AMD       TONSILLECTOMY               Allergies  Allergen Reactions   Aripiprazole Other (See Comments)      Increases cholesterol level     Sulfa (Sulfonamide Antibiotics) Rash   Quetiapine Other (See Comments)   Trazodone Other (See Comments)      "weird dreams"     Ziprasidone Hcl Other (See Comments)   Sulfasalazine Rash            Current Outpatient Medications on File Prior to Visit  Medication Sig Dispense Refill   acetaminophen (TYLENOL) 500 MG tablet 2 tablets for severe pain Orally every 8 hours for 30 days       BREZTRI AEROSPHERE 160-9-4.8 mcg/actuation inhaler 1 Puff       cetirizine (ZYRTEC) 5 MG tablet 1 tablet at bedtime Orally Once a day for 90 days  diphenhydrAMINE (BENADRYL) 25 mg capsule Take 25 mg by mouth       metoprolol TARTrate (LOPRESSOR) 25 MG tablet 1/2 tablet with food Orally Twice a day for 90 days       oxyCODONE (OXYIR) 10 mg immediate release tablet 1 tablet as needed Orally every 8 hours for 30 days       pantoprazole (PROTONIX) 40 MG DR tablet 1 tablet 1/2 to 1 hour before morning meal Orally Once a day for 90 days       potassium chloride (KLOR-CON) 10 mEq ER tablet potassium chloride ER 10 mEq tablet,extended release(part/cryst)       SYMBICORT 160-4.5 mcg/actuation inhaler Inhale 2 Inhalations into the lungs 2 (two) times daily       VENTOLIN HFA 90 mcg/actuation inhaler 1 Puff       carBAMazepine (TEGRETOL) 200 mg tablet carbamazepine 200 mg tablet (Patient not taking: Reported on 01/11/2023)       diltiazem (CARDIZEM) 30 MG tablet diltiazem 30 mg tablet (Patient not taking: Reported on 01/11/2023)       FLUoxetine  (PROZAC) 40 MG capsule  (Patient not taking: Reported on 01/11/2023)       fluticasone propionate (FLONASE) 50 mcg/actuation nasal spray fluticasone propionate 50 mcg/actuation nasal spray,suspension (Patient not taking: Reported on 01/11/2023)       FUROsemide (LASIX) 40 MG tablet furosemide 40 mg tablet (Patient not taking: Reported on 01/11/2023)       gabapentin (NEURONTIN) 100 MG capsule gabapentin 100 mg capsule (Patient not taking: Reported on 01/11/2023)       hydrocortisone 1 % cream 1 Application (Patient not taking: Reported on 01/11/2023)       lamoTRIgine (LAMICTAL) 200 MG tablet  (Patient not taking: Reported on 01/11/2023)       latanoprost, PF, 0.005 % Drop 1 drop OU Ophthalmic Once a day (Patient not taking: Reported on 01/11/2023)       levothyroxine (SYNTHROID) 50 MCG tablet levothyroxine 50 mcg tablet (Patient not taking: Reported on 01/11/2023)       lidocaine HCl-hydrocortison ac 3-0.5 % Crea Apply pea size amount to anorectum 4 times a day as needed (Patient not taking: Reported on 01/11/2023)       losartan (COZAAR) 50 MG tablet losartan 50 mg tablet (Patient not taking: Reported on 01/11/2023)       meclizine (ANTIVERT) 25 mg tablet meclizine 25 mg tablet (Patient not taking: Reported on 01/11/2023)       montelukast (SINGULAIR) 10 mg tablet  (Patient not taking: Reported on 01/11/2023)       OLANZapine (ZYPREXA) 10 MG tablet olanzapine 10 mg tablet (Patient not taking: Reported on 01/11/2023)       tiZANidine (ZANAFLEX) 4 MG tablet  (Patient not taking: Reported on 01/11/2023)        No current facility-administered medications on file prior to visit.           Family History  Problem Relation Age of Onset   Glaucoma Mother     Vision loss Neg Hx     Blindness Neg Hx     Amblyopia Neg Hx     Strabismus Neg Hx     Macular degeneration Neg Hx        Social History       Tobacco Use  Smoking Status Never  Smokeless Tobacco Never      Social History         Socioeconomic History   Marital status: Divorced  Tobacco  Use   Smoking status: Never   Smokeless tobacco: Never  Substance and Sexual Activity   Alcohol use: Never   Drug use: Never    Social Drivers of Health        Food Insecurity: No Food Insecurity (11/10/2022)    [facility]    Hunger Vital Sign     Worried About Running Out of Food in the Last Year: Never true     Ran Out of Food in the Last Year: Never true  Transportation Needs: No Transportation Needs (11/10/2022)    [facility] from [facility] - Transportation     Lack of Transportation (Medical): No     Lack of Transportation (Non-Medical): No      Objective:    Vitals:   02/24/23 0643  BP: (!) 117/58  Pulse: 81  Resp: 16  Temp: 98 F (36.7 C)  SpO2: 97%      Exam Gen: NAD Abd: soft Rectal: Consistent with chronic prolapse approximately 10 cm circumferential.  Reduces but immediately prolapses back out.  No ischemia noted.     Labs, Imaging and Diagnostic Testing:   Procedure: Anoscopy Surgeon: [name] After the risks and benefits were explained, written consent was obtained for above procedure.  A medical assistant chaperone was present thoroughout the entire procedure.  Anesthesia: none Diagnosis: rectal prolapse Findings: No masses noted.   Assessment and Plan:  Rectal Prolapse   78 year old female with chronic rectal prolapse.  We discussed performing a robotic assisted rectopexy.  We discussed the significant side effects of this surgery which are fecal incontinence and constipation.  We discussed the need for daily MiraLAX for the first 30 days after surgery.  We discussed other risk which include bleeding, urinary retention infection and damage to adjacent structures.  All questions were answered.  Patient would like to have surgery as soon as possible.     Vanita Panda, MD Colon and Rectal Surgery [facility] Surgery

## 2023-02-24 NOTE — Progress Notes (Signed)
Pharmacy Note   Per Alvimopan (Entereg) Procedure and Checklist Established by P&T Committee Protocol   Patient had a pre-operative dose ordered. Failure to order the pre-operative dose disqualifies the patient from receiving any post-operative doses. (P/T Committee restriction)  - This pt did not have a pre-op dose ordered. Therefore, the Entereg order will be discontinued.   Adalberto Cole, PharmD, BCPS 02/24/2023 12:20 PM

## 2023-02-24 NOTE — Anesthesia Procedure Notes (Signed)
Procedure Name: Intubation Date/Time: 02/24/2023 8:20 AM  Performed by: Florene Route, CRNAPre-anesthesia Checklist: Patient identified, Emergency Drugs available, Suction available and Patient being monitored Patient Re-evaluated:Patient Re-evaluated prior to induction Oxygen Delivery Method: Circle system utilized Preoxygenation: Pre-oxygenation with 100% oxygen Induction Type: IV induction Ventilation: Mask ventilation without difficulty Laryngoscope Size: Miller and 2 Grade View: Grade I Tube type: Oral Tube size: 7.0 mm Number of attempts: 1 Airway Equipment and Method: Stylet Placement Confirmation: ETT inserted through vocal cords under direct vision, positive ETCO2 and breath sounds checked- equal and bilateral Secured at: 20 cm Tube secured with: Tape Dental Injury: Teeth and Oropharynx as per pre-operative assessment

## 2023-02-24 NOTE — Progress Notes (Signed)
Unable to walk pt w/in 4 hours on the floor, pt sleeping soundly from pain meds. Currently eating supper and will then attempt to dangle/ambulate.

## 2023-02-24 NOTE — Transfer of Care (Signed)
Immediate Anesthesia Transfer of Care Note  Patient: Heidi Fuentes  Procedure(s) Performed: XI ROBOT ASSISTED RECTOPEXY  Patient Location: PACU  Anesthesia Type:General  Level of Consciousness: drowsy  Airway & Oxygen Therapy: Patient Spontanous Breathing and Patient connected to face mask oxygen  Post-op Assessment: Report given to RN and Post -op Vital signs reviewed and stable  Post vital signs: Reviewed and stable  Last Vitals:  Vitals Value Taken Time  BP 193/90 02/24/23 1021  Temp    Pulse 87 02/24/23 1023  Resp 15 02/24/23 1023  SpO2 89 % 02/24/23 1023  Vitals shown include unfiled device data.  Last Pain:  Vitals:   02/24/23 0643  TempSrc: Oral         Complications: No notable events documented.

## 2023-02-24 NOTE — Anesthesia Postprocedure Evaluation (Signed)
Anesthesia Post Note  Patient: Heidi Fuentes  Procedure(s) Performed: XI ROBOT ASSISTED RECTOPEXY     Patient location during evaluation: PACU Anesthesia Type: General Level of consciousness: awake and alert Pain management: pain level controlled Vital Signs Assessment: post-procedure vital signs reviewed and stable Respiratory status: spontaneous breathing, nonlabored ventilation and respiratory function stable Cardiovascular status: blood pressure returned to baseline and stable Postop Assessment: no apparent nausea or vomiting Anesthetic complications: no   No notable events documented.  Last Vitals:  Vitals:   02/24/23 1130 02/24/23 1157  BP: 123/69 111/61  Pulse: 81 76  Resp: 17 16  Temp:    SpO2: 100% 100%    Last Pain:  Vitals:   02/24/23 1130  TempSrc:   PainSc: Asleep                 Collene Schlichter

## 2023-02-24 NOTE — Anesthesia Preprocedure Evaluation (Addendum)
Anesthesia Evaluation  Patient identified by MRN, date of birth, ID band Patient awake    Reviewed: Allergy & Precautions, NPO status , Patient's Chart, lab work & pertinent test results, reviewed documented beta blocker date and time   Airway Mallampati: I  TM Distance: >3 FB Neck ROM: Full    Dental  (+) Dental Advisory Given, Upper Dentures, Missing   Pulmonary shortness of breath, asthma    Pulmonary exam normal breath sounds clear to auscultation       Cardiovascular hypertension, Pt. on home beta blockers (-) angina (-) Past MI and (-) Cardiac Stents Normal cardiovascular exam Rhythm:Regular Rate:Normal     Neuro/Psych  PSYCHIATRIC DISORDERS  Depression Bipolar Disorder   negative neurological ROS     GI/Hepatic Neg liver ROS, hiatal hernia,GERD  Medicated,,RECTAL PROLAPSE   Endo/Other  Hypothyroidism    Renal/GU negative Renal ROS     Musculoskeletal  (+) Arthritis ,  Fibromyalgia -  Abdominal   Peds  Hematology negative hematology ROS (+)   Anesthesia Other Findings Day of surgery medications reviewed with the patient.  Reproductive/Obstetrics                             Anesthesia Physical Anesthesia Plan  ASA: 3  Anesthesia Plan: General   Post-op Pain Management: Tylenol PO (pre-op)*   Induction: Intravenous  PONV Risk Score and Plan: 4 or greater and Dexamethasone, Ondansetron and Treatment may vary due to age or medical condition  Airway Management Planned: Oral ETT  Additional Equipment:   Intra-op Plan:   Post-operative Plan: Extubation in OR  Informed Consent: I have reviewed the patients History and Physical, chart, labs and discussed the procedure including the risks, benefits and alternatives for the proposed anesthesia with the patient or authorized representative who has indicated his/her understanding and acceptance.     Dental advisory given  Plan  Discussed with: CRNA  Anesthesia Plan Comments: (2nd PIV)        Anesthesia Quick Evaluation

## 2023-02-24 NOTE — Plan of Care (Signed)
  Problem: Nutritional: Goal: Will attain and maintain optimal nutritional status will improve Outcome: Progressing

## 2023-02-25 ENCOUNTER — Encounter (HOSPITAL_COMMUNITY): Payer: Self-pay | Admitting: General Surgery

## 2023-02-25 LAB — BASIC METABOLIC PANEL
Anion gap: 8 (ref 5–15)
BUN: 20 mg/dL (ref 8–23)
CO2: 25 mmol/L (ref 22–32)
Calcium: 8.4 mg/dL — ABNORMAL LOW (ref 8.9–10.3)
Chloride: 103 mmol/L (ref 98–111)
Creatinine, Ser: 0.62 mg/dL (ref 0.44–1.00)
GFR, Estimated: >60 mL/min (ref >60)
Glucose, Bld: 123 mg/dL — ABNORMAL HIGH (ref 70–99)
Potassium: 4.3 mmol/L (ref 3.5–5.1)
Sodium: 136 mmol/L (ref 135–145)

## 2023-02-25 LAB — CBC
HCT: 28.3 % — ABNORMAL LOW (ref 36.0–46.0)
Hemoglobin: 8.9 g/dL — ABNORMAL LOW (ref 12.0–15.0)
MCH: 32.4 pg (ref 26.0–34.0)
MCHC: 31.4 g/dL (ref 30.0–36.0)
MCV: 102.9 fL — ABNORMAL HIGH (ref 80.0–100.0)
Platelets: 218 10*3/uL (ref 150–400)
RBC: 2.75 MIL/uL — ABNORMAL LOW (ref 3.87–5.11)
RDW: 14.6 % (ref 11.5–15.5)
WBC: 8.7 10*3/uL (ref 4.0–10.5)
nRBC: 0 % (ref 0.0–0.2)

## 2023-02-25 NOTE — Progress Notes (Signed)
1 Day Post-Op Rob Rectopexy Subjective: Doing well, pain controlled, no nausea, passing flatus  Objective: Vital signs in last 24 hours: Temp:  [97.4 F (36.3 C)-98.6 F (37 C)] 98.5 F (36.9 C) (01/23 8119) Pulse Rate:  [71-89] 72 (01/23 0608) Resp:  [11-20] 17 (01/23 0608) BP: (91-179)/(50-103) 108/51 (01/23 0608) SpO2:  [90 %-100 %] 100 % (01/23 1478)   Intake/Output from previous day: 01/22 0701 - 01/23 0700 In: 2523.4 [P.O.:340; I.V.:1983.4; IV Piggyback:200] Out: 1285 [Urine:1150; Drains:110; Blood:25] Intake/Output this shift: No intake/output data recorded.   General appearance: alert and cooperative GI: soft, ND JP: SSF Incision: no significant drainage  Lab Results:  Recent Labs    02/25/23 0431  WBC 8.7  HGB 8.9*  HCT 28.3*  PLT 218   BMET Recent Labs    02/25/23 0431  NA 136  K 4.3  CL 103  CO2 25  GLUCOSE 123*  BUN 20  CREATININE 0.62  CALCIUM 8.4*   PT/INR No results for input(s): "LABPROT", "INR" in the last 72 hours. ABG No results for input(s): "PHART", "HCO3" in the last 72 hours.  Invalid input(s): "PCO2", "PO2"  MEDS, Scheduled  buPROPion  75 mg Oral BID   Chlorhexidine Gluconate Cloth  6 each Topical Daily   enoxaparin (LOVENOX) injection  40 mg Subcutaneous Q24H   feeding supplement  237 mL Oral BID BM   FLUoxetine  40 mg Oral Daily   fluticasone  2 spray Each Nare Daily   fluticasone furoate-vilanterol  1 puff Inhalation Daily   And   umeclidinium bromide  1 puff Inhalation Daily   gabapentin  600 mg Oral QID   lamoTRIgine  200 mg Oral BID   latanoprost  1 drop Both Eyes QHS   levothyroxine  25 mcg Oral QAC breakfast   losartan  25 mg Oral Daily   melatonin  5 mg Oral QHS   metoprolol tartrate  12.5 mg Oral BID   montelukast  10 mg Oral Daily   pantoprazole  20 mg Oral Daily   polyethylene glycol  17 g Oral Daily   saccharomyces boulardii  250 mg Oral BID    Studies/Results: No results found.  Assessment: s/p  Procedure(s): XI ROBOT ASSISTED RECTOPEXY Patient Active Problem List   Diagnosis Date Noted   Rectal prolapse 02/24/2023   Sacral fracture, closed (HCC) 11/20/2022   Lumbar vertebral fracture (HCC) 11/20/2022   Compression fracture of pelvis (HCC) 11/10/2022   Aspiration pneumonia (HCC) 11/09/2022   Agitation 07/27/2022   Mixed bipolar I disorder (HCC) 07/22/2022   Delirium 07/22/2022   Depression, major, recurrent, mild (HCC) 07/22/2022   Severe sepsis (HCC) 07/15/2022   UTI (urinary tract infection) 07/15/2022   Encephalopathy, metabolic 07/15/2022   History of adenomatous polyp of colon 05/12/2021   Bipolar affective disorder, depressed, mild (HCC) 08/07/2020   Rhabdomyolysis 08/03/2020   Elevated LFTs 08/03/2020   Unilateral primary osteoarthritis, left hip 03/26/2020   Pedal edema 03/18/2020   Pain in joint involving ankle and foot 12/20/2019   Bronchiectasis without complication (HCC) 10/31/2019   DOE (dyspnea on exertion) 07/27/2019   Dysphagia 07/25/2018   Leukocytosis 07/25/2018   Closed fracture of left orbital floor (HCC) 07/25/2018   Exudative age-related macular degeneration, bilateral, with active choroidal neovascularization (HCC) 07/25/2018   CAP (community acquired pneumonia) due to MRSA (methicillin resistant Staphylococcus aureus) (HCC) 02/13/2018   Asthma, chronic, unspecified asthma severity, with acute exacerbation 02/04/2018   Cough 11/02/2017   Gastroesophageal reflux disease without esophagitis  10/30/2017   Other specified glaucoma 10/30/2017   Uncomplicated asthma 10/30/2017   Chronic rhinitis 10/30/2017   Long-term current use of opiate analgesic 03/10/2017   Essential hypertension 02/17/2017   On long term drug therapy 02/08/2017   Chronic narcotic use 02/04/2017   Hiatal hernia 02/03/2017   Hypokalemia 09/06/2015   Fall    Iron deficiency anemia, unspecified 12/15/2013   Schatzki's ring 08/17/2013   Anemia 08/17/2013   Encounter for  screening colonoscopy 08/17/2013   Constipation 07/10/2013   Chronic pain syndrome 07/10/2013   Acquired hypothyroidism 07/09/2013   Colitis 07/09/2013   Asthma, chronic 07/09/2013    Expected post op course  Plan: Advance diet to reg per pt wishes SL IVF's Ambulate in hall   LOS: 1 day     .Heidi Panda, MD [facility], [place]    02/25/2023 8:31 AM

## 2023-02-25 NOTE — Plan of Care (Signed)

## 2023-02-25 NOTE — Progress Notes (Signed)
Mobility Specialist - Progress Note   02/25/23 1539  Mobility  Activity Ambulated with assistance in hallway  Level of Assistance Standby assist, set-up cues, supervision of patient - no hands on  Assistive Device Front wheel walker  Distance Ambulated (ft) 250 ft  Activity Response Tolerated well  Mobility Referral Yes  Mobility visit 1 Mobility  Mobility Specialist Stop Time (ACUTE ONLY) 1335   Pt received in bed and agreeable to mobility. No complaints during session. Pt to bed after session with all needs met.    [facility]

## 2023-02-26 LAB — CBC
HCT: 25.8 % — ABNORMAL LOW (ref 36.0–46.0)
Hemoglobin: 8.2 g/dL — ABNORMAL LOW (ref 12.0–15.0)
MCH: 32.5 pg (ref 26.0–34.0)
MCHC: 31.8 g/dL (ref 30.0–36.0)
MCV: 102.4 fL — ABNORMAL HIGH (ref 80.0–100.0)
Platelets: 190 10*3/uL (ref 150–400)
RBC: 2.52 MIL/uL — ABNORMAL LOW (ref 3.87–5.11)
RDW: 14.4 % (ref 11.5–15.5)
WBC: 7.4 10*3/uL (ref 4.0–10.5)
nRBC: 0 % (ref 0.0–0.2)

## 2023-02-26 MED ORDER — METHOCARBAMOL 500 MG PO TABS: 1000.0000 mg | ORAL_TABLET | Freq: Four times a day (QID) | ORAL | Status: DC | PRN

## 2023-02-26 MED ORDER — DICYCLOMINE HCL 10 MG PO CAPS
10.0000 mg | ORAL_CAPSULE | Freq: Three times a day (TID) | ORAL | Status: DC
  Administered 2023-02-26 – 2023-02-28 (×5): 10 mg via ORAL
  Filled 2023-02-26 (×6): qty 1

## 2023-02-26 NOTE — Progress Notes (Signed)
Mobility Specialist - Progress Note   02/26/23 1042  Mobility  Activity Ambulated with assistance in hallway  Level of Assistance Standby assist, set-up cues, supervision of patient - no hands on  Assistive Device Front wheel walker  Distance Ambulated (ft) 275 ft  Activity Response Tolerated well  Mobility Referral Yes  Mobility visit 1 Mobility  Mobility Specialist Start Time (ACUTE ONLY) 1028  Mobility Specialist Stop Time (ACUTE ONLY) 1041  Mobility Specialist Time Calculation (min) (ACUTE ONLY) 13 min   Pt received in bed and agreeable to mobility. C/o pain near incisions. Pt to recliner after session with all needs met.   [facility]

## 2023-02-26 NOTE — Progress Notes (Signed)
2 Days Post-Op Rob Rectopexy Subjective: no nausea, passing flatus, having some crampy pain  Objective: Vital signs in last 24 hours: Temp:  [97.8 F (36.6 C)-98.3 F (36.8 C)] 98 F (36.7 C) (01/24 0545) Pulse Rate:  [69-75] 69 (01/24 0545) Resp:  [17-18] 18 (01/24 0545) BP: (109-140)/(43-55) 109/55 (01/24 0545) SpO2:  [94 %-99 %] 94 % (01/24 0800)   Intake/Output from previous day: 01/23 0701 - 01/24 0700 In: 1800 [P.O.:1800] Out: 2880 [Urine:2700; Drains:180] Intake/Output this shift: Total I/O In: -  Out: 505 [Urine:500; Drains:5]   General appearance: alert and cooperative GI: soft, ND JP: SSF Incision: no significant drainage  Lab Results:  Recent Labs    02/25/23 0431 02/26/23 0424  WBC 8.7 7.4  HGB 8.9* 8.2*  HCT 28.3* 25.8*  PLT 218 190   BMET Recent Labs    02/25/23 0431  NA 136  K 4.3  CL 103  CO2 25  GLUCOSE 123*  BUN 20  CREATININE 0.62  CALCIUM 8.4*   PT/INR No results for input(s): "LABPROT", "INR" in the last 72 hours. ABG No results for input(s): "PHART", "HCO3" in the last 72 hours.  Invalid input(s): "PCO2", "PO2"  MEDS, Scheduled  buPROPion  75 mg Oral BID   Chlorhexidine Gluconate Cloth  6 each Topical Daily   enoxaparin (LOVENOX) injection  40 mg Subcutaneous Q24H   feeding supplement  237 mL Oral BID BM   FLUoxetine  40 mg Oral Daily   fluticasone  2 spray Each Nare Daily   fluticasone furoate-vilanterol  1 puff Inhalation Daily   And   umeclidinium bromide  1 puff Inhalation Daily   gabapentin  600 mg Oral QID   lamoTRIgine  200 mg Oral BID   latanoprost  1 drop Both Eyes QHS   levothyroxine  25 mcg Oral QAC breakfast   losartan  25 mg Oral Daily   melatonin  5 mg Oral QHS   metoprolol tartrate  12.5 mg Oral BID   montelukast  10 mg Oral Daily   pantoprazole  20 mg Oral Daily   polyethylene glycol  17 g Oral Daily   saccharomyces boulardii  250 mg Oral BID    Studies/Results: No results  found.  Assessment: s/p Procedure(s): XI ROBOT ASSISTED RECTOPEXY Patient Active Problem List   Diagnosis Date Noted   Rectal prolapse 02/24/2023   Sacral fracture, closed (HCC) 11/20/2022   Lumbar vertebral fracture (HCC) 11/20/2022   Compression fracture of pelvis (HCC) 11/10/2022   Aspiration pneumonia (HCC) 11/09/2022   Agitation 07/27/2022   Mixed bipolar I disorder (HCC) 07/22/2022   Delirium 07/22/2022   Depression, major, recurrent, mild (HCC) 07/22/2022   Severe sepsis (HCC) 07/15/2022   UTI (urinary tract infection) 07/15/2022   Encephalopathy, metabolic 07/15/2022   History of adenomatous polyp of colon 05/12/2021   Bipolar affective disorder, depressed, mild (HCC) 08/07/2020   Rhabdomyolysis 08/03/2020   Elevated LFTs 08/03/2020   Unilateral primary osteoarthritis, left hip 03/26/2020   Pedal edema 03/18/2020   Pain in joint involving ankle and foot 12/20/2019   Bronchiectasis without complication (HCC) 10/31/2019   DOE (dyspnea on exertion) 07/27/2019   Dysphagia 07/25/2018   Leukocytosis 07/25/2018   Closed fracture of left orbital floor (HCC) 07/25/2018   Exudative age-related macular degeneration, bilateral, with active choroidal neovascularization (HCC) 07/25/2018   CAP (community acquired pneumonia) due to MRSA (methicillin resistant Staphylococcus aureus) (HCC) 02/13/2018   Asthma, chronic, unspecified asthma severity, with acute exacerbation 02/04/2018   Cough 11/02/2017  Gastroesophageal reflux disease without esophagitis 10/30/2017   Other specified glaucoma 10/30/2017   Uncomplicated asthma 10/30/2017   Chronic rhinitis 10/30/2017   Long-term current use of opiate analgesic 03/10/2017   Essential hypertension 02/17/2017   On long term drug therapy 02/08/2017   Chronic narcotic use 02/04/2017   Hiatal hernia 02/03/2017   Hypokalemia 09/06/2015   Fall    Iron deficiency anemia, unspecified 12/15/2013   Schatzki's ring 08/17/2013   Anemia  08/17/2013   Encounter for screening colonoscopy 08/17/2013   Constipation 07/10/2013   Chronic pain syndrome 07/10/2013   Acquired hypothyroidism 07/09/2013   Colitis 07/09/2013   Asthma, chronic 07/09/2013    Expected post op course  Plan: Cont Reg diet SL IVF's D/c foley Ambulate in hall Will try some robaxin and bentyl for crampy pain, ambulate in hall TID   LOS: 2 days     .Vanita Panda, MD [facility], [place]    02/26/2023 11:48 AM

## 2023-02-26 NOTE — Progress Notes (Signed)
Mobility Specialist - Progress Note   02/26/23 1602  Mobility  Activity Ambulated with assistance to bathroom  Level of Assistance Standby assist, set-up cues, supervision of patient - no hands on  Assistive Device Front wheel walker  Distance Ambulated (ft) 20 ft  Activity Response Tolerated well  Mobility Referral Yes  Mobility visit 1 Mobility  Mobility Specialist Stop Time (ACUTE ONLY) 1604   Pt received in bed requesting assistance to the bathroom. Pt found to be incontinent. RN and Clinical research associate assisted pt w/ pericare. No complaints during session. Pt to bed after session.   [facility]

## 2023-02-26 NOTE — Progress Notes (Signed)
   02/26/23 1457  TOC Brief Assessment  Insurance and Status Reviewed  Patient has primary care physician Yes  Home environment has been reviewed home alone  Prior level of function: independent  Prior/Current Home Services No current home services  Social Drivers of Health Review SDOH reviewed no interventions necessary  Readmission risk has been reviewed Yes  Transition of care needs no transition of care needs at this time

## 2023-02-27 MED ORDER — CALCIUM POLYCARBOPHIL 625 MG PO TABS
625.0000 mg | ORAL_TABLET | Freq: Every day | ORAL | Status: DC
  Administered 2023-02-27 – 2023-02-28 (×2): 625 mg via ORAL
  Filled 2023-02-27 (×2): qty 1

## 2023-02-27 NOTE — Plan of Care (Signed)

## 2023-02-27 NOTE — Progress Notes (Signed)
3 Days Post-Op Rob Rectopexy Subjective: no nausea, passing flatus, still having some crampy pain  Objective: Vital signs in last 24 hours: Temp:  [98.3 F (36.8 C)-98.4 F (36.9 C)] 98.3 F (36.8 C) (01/25 0647) Pulse Rate:  [68-80] 80 (01/25 0647) Resp:  [18] 18 (01/25 0647) BP: (122-143)/(49-56) 140/56 (01/25 0647) SpO2:  [93 %-96 %] 95 % (01/25 0823)   Intake/Output from previous day: 01/24 0701 - 01/25 0700 In: 900 [P.O.:900] Out: 549 [Urine:505; Drains:40; Stool:4] Intake/Output this shift: No intake/output data recorded.   General appearance: alert and cooperative GI: soft, ND JP: SSF Incision: no significant drainage  Lab Results:  Recent Labs    02/25/23 0431 02/26/23 0424  WBC 8.7 7.4  HGB 8.9* 8.2*  HCT 28.3* 25.8*  PLT 218 190   BMET Recent Labs    02/25/23 0431  NA 136  K 4.3  CL 103  CO2 25  GLUCOSE 123*  BUN 20  CREATININE 0.62  CALCIUM 8.4*   PT/INR No results for input(s): "LABPROT", "INR" in the last 72 hours. ABG No results for input(s): "PHART", "HCO3" in the last 72 hours.  Invalid input(s): "PCO2", "PO2"  MEDS, Scheduled  buPROPion  75 mg Oral BID   Chlorhexidine Gluconate Cloth  6 each Topical Daily   dicyclomine  10 mg Oral TID AC   enoxaparin (LOVENOX) injection  40 mg Subcutaneous Q24H   feeding supplement  237 mL Oral BID BM   FLUoxetine  40 mg Oral Daily   fluticasone  2 spray Each Nare Daily   fluticasone furoate-vilanterol  1 puff Inhalation Daily   And   umeclidinium bromide  1 puff Inhalation Daily   gabapentin  600 mg Oral QID   lamoTRIgine  200 mg Oral BID   latanoprost  1 drop Both Eyes QHS   levothyroxine  25 mcg Oral QAC breakfast   losartan  25 mg Oral Daily   melatonin  5 mg Oral QHS   metoprolol tartrate  12.5 mg Oral BID   montelukast  10 mg Oral Daily   pantoprazole  20 mg Oral Daily   polyethylene glycol  17 g Oral Daily   saccharomyces boulardii  250 mg Oral BID    Studies/Results: No  results found.  Assessment: s/p Procedure(s): XI ROBOT ASSISTED RECTOPEXY Patient Active Problem List   Diagnosis Date Noted   Rectal prolapse 02/24/2023   Sacral fracture, closed (HCC) 11/20/2022   Lumbar vertebral fracture (HCC) 11/20/2022   Compression fracture of pelvis (HCC) 11/10/2022   Aspiration pneumonia (HCC) 11/09/2022   Agitation 07/27/2022   Mixed bipolar I disorder (HCC) 07/22/2022   Delirium 07/22/2022   Depression, major, recurrent, mild (HCC) 07/22/2022   Severe sepsis (HCC) 07/15/2022   UTI (urinary tract infection) 07/15/2022   Encephalopathy, metabolic 07/15/2022   History of adenomatous polyp of colon 05/12/2021   Bipolar affective disorder, depressed, mild (HCC) 08/07/2020   Rhabdomyolysis 08/03/2020   Elevated LFTs 08/03/2020   Unilateral primary osteoarthritis, left hip 03/26/2020   Pedal edema 03/18/2020   Pain in joint involving ankle and foot 12/20/2019   Bronchiectasis without complication (HCC) 10/31/2019   DOE (dyspnea on exertion) 07/27/2019   Dysphagia 07/25/2018   Leukocytosis 07/25/2018   Closed fracture of left orbital floor (HCC) 07/25/2018   Exudative age-related macular degeneration, bilateral, with active choroidal neovascularization (HCC) 07/25/2018   CAP (community acquired pneumonia) due to MRSA (methicillin resistant Staphylococcus aureus) (HCC) 02/13/2018   Asthma, chronic, unspecified asthma severity, with acute  exacerbation 02/04/2018   Cough 11/02/2017   Gastroesophageal reflux disease without esophagitis 10/30/2017   Other specified glaucoma 10/30/2017   Uncomplicated asthma 10/30/2017   Chronic rhinitis 10/30/2017   Long-term current use of opiate analgesic 03/10/2017   Essential hypertension 02/17/2017   On long term drug therapy 02/08/2017   Chronic narcotic use 02/04/2017   Hiatal hernia 02/03/2017   Hypokalemia 09/06/2015   Fall    Iron deficiency anemia, unspecified 12/15/2013   Schatzki's ring 08/17/2013   Anemia  08/17/2013   Encounter for screening colonoscopy 08/17/2013   Constipation 07/10/2013   Chronic pain syndrome 07/10/2013   Acquired hypothyroidism 07/09/2013   Colitis 07/09/2013   Asthma, chronic 07/09/2013    Expected post op course  Plan: Cont Reg diet Ambulate in hall Cont robaxin and bentyl for crampy pain, d/c Miralax ambulate in hall TID   LOS: 3 days     .Vanita Panda, MD [facility], [place]    02/27/2023 8:33 AM

## 2023-02-28 MED ORDER — CALCIUM POLYCARBOPHIL 625 MG PO TABS: 625.0000 mg | ORAL_TABLET | Freq: Every day | ORAL | Status: DC

## 2023-02-28 NOTE — Progress Notes (Signed)
Assessment unchanged. Pt and friend (caregiver) verbalized understanding of all instructions given with no questions. [facility] phone provide through out AVS if needed. Drain dc'd. Discharged via wc to front entrance accompanied by friend and NT.

## 2023-02-28 NOTE — Discharge Instructions (Addendum)
ABDOMINAL SURGERY: POST OP INSTRUCTIONS  DIET: Follow a light bland diet the first 24 hours after arrival home, such as soup, liquids, crackers, etc.  Be sure to include lots of fluids daily.  Avoid fast food or heavy meals as your are more likely to get nauseated.   Take your usually prescribed home medications unless otherwise directed. PAIN CONTROL: Pain is best controlled by a usual combination of three different methods TOGETHER: Ice/Heat Over the counter pain medication Prescription pain medication Most patients will experience some swelling and bruising around the incisions.  Ice packs or heating pads (30-60 minutes up to 6 times a day) will help. Use ice for the first few days to help decrease swelling and bruising, then switch to heat to help relax tight/sore spots and speed recovery.  Some people prefer to use ice alone, heat alone, alternating between ice & heat.  Experiment to what works for you.  Swelling and bruising can take several weeks to resolve.   It is helpful to take an over-the-counter pain medication regularly for the first few weeks.  Choose one of the following that works best for you: Naproxen (Aleve, etc)  Two 220mg  tabs twice a day Ibuprofen (Advil, etc) Three 200mg  tabs four times a day (every meal & bedtime) Acetaminophen (Tylenol, etc) 500-650mg  four times a day (every meal & bedtime)  Avoid getting constipated.  Between the surgery and the pain medications, it is common to experience some constipation.  Increasing fluid intake and taking a fiber supplement (such as Metamucil, Citrucel, FiberCon, MiraLax, etc) 1-2 times a day regularly will usually help prevent this problem from occurring.  A mild laxative (prune juice, Milk of Magnesia, MiraLax, etc) should be taken according to package directions if there are no bowel movements after 48 hours.   Watch out for diarrhea.  If you have many loose bowel movements, simplify your diet to bland foods & liquids for a few  days.  Stop any stool softeners and decrease your fiber supplement.  Switching to mild anti-diarrheal medications (Kayopectate, Pepto Bismol) can help.  If this worsens or does not improve, please call us. Wash / shower every day.  You may shower over the incision / wound.  Avoid baths until the skin is fully healed.  Continue to shower over incision(s) after the dressing is off. ACTIVITIES as tolerated:   You may resume regular (light) daily activities beginning the next day--such as daily self-care, walking, climbing stairs--gradually increasing activities as tolerated.  If you can walk 30 minutes without difficulty, it is safe to try more intense activity such as jogging, treadmill, bicycling, low-impact aerobics, swimming, etc. Save the most intensive and strenuous activity for last such as sit-ups, heavy lifting, contact sports, etc  Refrain from any heavy lifting or straining until you are off narcotics for pain control.   DO NOT PUSH THROUGH PAIN.  Let pain be your guide: If it hurts to do something, don't do it.  Pain is your body warning you to avoid that activity for another week until the pain goes down. You may drive when you are no longer taking prescription pain medication, you can comfortably wear a seatbelt, and you can safely maneuver your car and apply brakes. You may have sexual intercourse when it is comfortable.  FOLLOW UP in our office Please call [facility] at [phone] to set up an appointment to see your surgeon in the office for a follow-up appointment approximately 1-2 weeks after your surgery. Make sure that  you call for this appointment the day you arrive home to insure a convenient appointment time. 10. IF YOU HAVE DISABILITY OR FAMILY LEAVE FORMS, BRING THEM TO THE OFFICE FOR PROCESSING.  DO NOT GIVE THEM TO YOUR DOCTOR.   WHEN TO CALL us [phone]: Poor pain control Reactions / problems with new medications (rash/itching, nausea, etc)  Fever over 101.5 F (38.5  C) Inability to urinate Nausea and/or vomiting Worsening swelling or bruising Continued bleeding from incision. Increased pain, redness, or drainage from the incision  The clinic staff is available to answer your questions during regular business hours (8:30am-5pm).  Please don't hesitate to call and ask to speak to one of our nurses for clinical concerns.   A surgeon from [facility] is always on call at the hospitals   If you have a medical emergency, go to the nearest emergency room or call 911.    [facility] [address], [place], [place]  [address] ? MAIN: [phone] ? TOLL FREE: [phone] ? FAX [phone] [email or web address]

## 2023-02-28 NOTE — Discharge Summary (Signed)
Physician Discharge Summary  Patient ID: Heidi Fuentes MRN: [id number] DOB/AGE: 1945/09/06 78 y.o.  Admit date: 02/24/2023 Discharge date: 02/28/2023  Admission Diagnoses: Rectal Prolapse Discharge Diagnoses:  Principal Problem:   Rectal prolapse   Discharged Condition: good  Hospital Course: Patient was admitted to the med surg floor after surgery.  Diet was advanced as tolerated.  Patient began to have bowel function on postop day 2.  By postop day 4, she was tolerating a solid diet and pain was controlled with oral medications.  She was urinating without difficulty and ambulating without assistance.  Patient was felt to be in stable condition for discharge to home.   Consults: None  Significant Diagnostic Studies: labs: cbc, bmet  Treatments: IV hydration and surgery: Robotic rectopexy  Discharge Exam: Blood pressure (!) 144/62, pulse 93, temperature 98.2 F (36.8 C), temperature source Oral, resp. rate 18, height 4\' 11"  (1.499 m), weight 53.5 kg, SpO2 97%. General appearance: alert and cooperative GI: soft, non-tender Incision/Wound: clean, dry, intact  Disposition: Discharge disposition: 01-Home or Self Care        Allergies as of 02/28/2023       Reactions   Sulfa Antibiotics Rash, Hives   Mirtazapine Other (See Comments)   HA's   Trazodone Other (See Comments)   Trazodone Hcl Other (See Comments)   strange dreams   Ziprasidone Other (See Comments)   cardiac issues   Aripiprazole Other (See Comments)   Bad dreams   Quetiapine Palpitations   Trazodone And Nefazodone Other (See Comments)   unknown   Ziprasidone Hcl Other (See Comments)   hospitalization specifics        Medication List     TAKE these medications    acetaminophen 500 MG tablet Commonly known as: TYLENOL Take 500 mg by mouth every 6 (six) hours as needed for mild pain or moderate pain. For pain   Breztri Aerosphere 160-9-4.8 MCG/ACT Aero Generic drug:  Budeson-Glycopyrrol-Formoterol Inhale into the lungs.   buPROPion 75 MG tablet Commonly known as: WELLBUTRIN Take 1 tablet (75 mg total) by mouth 2 (two) times daily.   diphenhydrAMINE 25 mg capsule Commonly known as: BENADRYL Take 25 mg by mouth every 6 (six) hours as needed for allergies.   FLUoxetine 40 MG capsule Commonly known as: PROZAC Take 40 mg by mouth daily.   fluticasone 50 MCG/ACT nasal spray Commonly known as: FLONASE Place 2 sprays into both nostrils daily.   gabapentin 300 MG capsule Commonly known as: NEURONTIN Take 600 mg by mouth 4 (four) times daily.   ketoconazole 2 % cream Commonly known as: NIZORAL Apply 1 Application topically daily as needed for irritation.   lamoTRIgine 200 MG tablet Commonly known as: LAMICTAL Take 200 mg by mouth 2 (two) times daily.   latanoprost 0.005 % ophthalmic solution Commonly known as: XALATAN 1 drop daily.   levothyroxine 25 MCG tablet Commonly known as: SYNTHROID Take 25 mcg by mouth daily before breakfast.   Lidocaine-Hydrocortisone Ace 3-0.5 % Crea Apply pea size amount to anorectum 4 times a day as needed   losartan 25 MG tablet Commonly known as: COZAAR Take 1 tablet (25 mg total) by mouth daily.   melatonin 5 MG Tabs Take 5 mg by mouth at bedtime.   metoprolol tartrate 25 MG tablet Commonly known as: LOPRESSOR Take 1 tablet (25 mg total) by mouth 2 (two) times daily. What changed: how much to take   montelukast 10 MG tablet Commonly known as: SINGULAIR Take 10 mg by  mouth daily.   Oxycodone HCl 10 MG Tabs Take 10 mg by mouth every 8 (eight) hours as needed.   pantoprazole 20 MG tablet Commonly known as: PROTONIX SMARTSIG:1.0 Tablet(s) By Mouth Daily   polycarbophil 625 MG tablet Commonly known as: FIBERCON Take 1 tablet (625 mg total) by mouth daily.   Symbicort 160-4.5 MCG/ACT inhaler Generic drug: budesonide-formoterol Inhale 2 puffs into the lungs in the morning and at bedtime.    Ventolin HFA 108 (90 Base) MCG/ACT inhaler Generic drug: albuterol SMARTSIG:1 Puff(s) By Mouth Every 4 Hours PRN        Follow-up Information     Romie Levee, MD. Schedule an appointment as soon as possible for a visit in 2 week(s).   Specialties: General Surgery, Colon and Rectal Surgery Contact information: [address] [place] [address] [phone]                 Signed: Vanita Panda 02/28/2023, 8:06 AM

## 2023-02-28 NOTE — Progress Notes (Signed)
AVS reviewed w/ pt who verbalized an understanding. PIV x 2 removed as noted. Pt's friend will be taking pt home, primary RN will review AVS w/ friend. Pt dressing for d/c to home.

## 2023-02-28 NOTE — Plan of Care (Signed)
  Problem: Education: Goal: Understanding of discharge needs will improve Outcome: Adequate for Discharge Goal: Verbalization of understanding of the causes of altered bowel function will improve Outcome: Adequate for Discharge   Problem: Activity: Goal: Ability to tolerate increased activity will improve Outcome: Adequate for Discharge   Problem: Bowel/Gastric: Goal: Gastrointestinal status for postoperative course will improve Outcome: Adequate for Discharge   Problem: Health Behavior/Discharge Planning: Goal: Identification of community resources to assist with postoperative recovery needs will improve Outcome: Adequate for Discharge   Problem: Nutritional: Goal: Will attain and maintain optimal nutritional status will improve Outcome: Adequate for Discharge   Problem: Clinical Measurements: Goal: Postoperative complications will be avoided or minimized Outcome: Adequate for Discharge   Problem: Respiratory: Goal: Respiratory status will improve Outcome: Adequate for Discharge   Problem: Skin Integrity: Goal: Will show signs of wound healing Outcome: Adequate for Discharge   Problem: Education: Goal: Knowledge of General Education information will improve Description: Including pain rating scale, medication(s)/side effects and non-pharmacologic comfort measures Outcome: Adequate for Discharge   Problem: Health Behavior/Discharge Planning: Goal: Ability to manage health-related needs will improve Outcome: Adequate for Discharge   Problem: Clinical Measurements: Goal: Ability to maintain clinical measurements within normal limits will improve Outcome: Adequate for Discharge Goal: Will remain free from infection Outcome: Adequate for Discharge Goal: Diagnostic test results will improve Outcome: Adequate for Discharge Goal: Respiratory complications will improve Outcome: Adequate for Discharge Goal: Cardiovascular complication will be avoided Outcome: Adequate for  Discharge   Problem: Activity: Goal: Risk for activity intolerance will decrease Outcome: Adequate for Discharge   Problem: Nutrition: Goal: Adequate nutrition will be maintained Outcome: Adequate for Discharge   Problem: Coping: Goal: Level of anxiety will decrease Outcome: Adequate for Discharge   Problem: Elimination: Goal: Will not experience complications related to bowel motility Outcome: Adequate for Discharge Goal: Will not experience complications related to urinary retention Outcome: Adequate for Discharge   Problem: Pain Managment: Goal: General experience of comfort will improve and/or be controlled Outcome: Adequate for Discharge   Problem: Safety: Goal: Ability to remain free from injury will improve Outcome: Adequate for Discharge   Problem: Skin Integrity: Goal: Risk for impaired skin integrity will decrease Outcome: Adequate for Discharge

## 2023-03-02 ENCOUNTER — Encounter: Payer: Self-pay | Admitting: Internal Medicine

## 2023-03-06 ENCOUNTER — Emergency Department (HOSPITAL_COMMUNITY): Payer: Medicare Other

## 2023-03-06 ENCOUNTER — Inpatient Hospital Stay (HOSPITAL_COMMUNITY)
Admission: EM | Admit: 2023-03-06 | Discharge: 2023-03-11 | DRG: 177 | Disposition: A | Discharge status: Skilled Nursing Facility | Payer: Medicare Other | Attending: Internal Medicine | Admitting: Internal Medicine

## 2023-03-06 DIAGNOSIS — Z7951 Long term (current) use of inhaled steroids: Secondary | ICD-10-CM | POA: Diagnosis not present

## 2023-03-06 DIAGNOSIS — H918X3 Other specified hearing loss, bilateral: Secondary | ICD-10-CM | POA: Diagnosis present

## 2023-03-06 DIAGNOSIS — U071 COVID-19: Secondary | ICD-10-CM | POA: Diagnosis not present

## 2023-03-06 DIAGNOSIS — F3130 Bipolar disorder, current episode depressed, mild or moderate severity, unspecified: Secondary | ICD-10-CM | POA: Diagnosis present

## 2023-03-06 DIAGNOSIS — J45909 Unspecified asthma, uncomplicated: Secondary | ICD-10-CM | POA: Diagnosis present

## 2023-03-06 DIAGNOSIS — J208 Acute bronchitis due to other specified organisms: Secondary | ICD-10-CM | POA: Diagnosis present

## 2023-03-06 DIAGNOSIS — Y838 Other surgical procedures as the cause of abnormal reaction of the patient, or of later complication, without mention of misadventure at the time of the procedure: Secondary | ICD-10-CM | POA: Diagnosis present

## 2023-03-06 DIAGNOSIS — Z8249 Family history of ischemic heart disease and other diseases of the circulatory system: Secondary | ICD-10-CM | POA: Diagnosis not present

## 2023-03-06 DIAGNOSIS — R296 Repeated falls: Secondary | ICD-10-CM | POA: Diagnosis present

## 2023-03-06 DIAGNOSIS — E876 Hypokalemia: Secondary | ICD-10-CM | POA: Diagnosis present

## 2023-03-06 DIAGNOSIS — K91840 Postprocedural hemorrhage and hematoma of a digestive system organ or structure following a digestive system procedure: Secondary | ICD-10-CM | POA: Diagnosis present

## 2023-03-06 DIAGNOSIS — K219 Gastro-esophageal reflux disease without esophagitis: Secondary | ICD-10-CM | POA: Diagnosis present

## 2023-03-06 DIAGNOSIS — J4530 Mild persistent asthma, uncomplicated: Secondary | ICD-10-CM

## 2023-03-06 DIAGNOSIS — H409 Unspecified glaucoma: Secondary | ICD-10-CM | POA: Diagnosis present

## 2023-03-06 DIAGNOSIS — K623 Rectal prolapse: Secondary | ICD-10-CM | POA: Diagnosis present

## 2023-03-06 DIAGNOSIS — J9601 Acute respiratory failure with hypoxia: Principal | ICD-10-CM | POA: Diagnosis present

## 2023-03-06 DIAGNOSIS — Z888 Allergy status to other drugs, medicaments and biological substances status: Secondary | ICD-10-CM

## 2023-03-06 DIAGNOSIS — Z91148 Patient's other noncompliance with medication regimen for other reason: Secondary | ICD-10-CM

## 2023-03-06 DIAGNOSIS — Z961 Presence of intraocular lens: Secondary | ICD-10-CM | POA: Diagnosis present

## 2023-03-06 DIAGNOSIS — J449 Chronic obstructive pulmonary disease, unspecified: Secondary | ICD-10-CM | POA: Diagnosis present

## 2023-03-06 DIAGNOSIS — E039 Hypothyroidism, unspecified: Secondary | ICD-10-CM | POA: Diagnosis present

## 2023-03-06 DIAGNOSIS — M797 Fibromyalgia: Secondary | ICD-10-CM | POA: Diagnosis present

## 2023-03-06 DIAGNOSIS — I10 Essential (primary) hypertension: Secondary | ICD-10-CM | POA: Diagnosis present

## 2023-03-06 DIAGNOSIS — F05 Delirium due to known physiological condition: Secondary | ICD-10-CM | POA: Diagnosis not present

## 2023-03-06 DIAGNOSIS — Z79899 Other long term (current) drug therapy: Secondary | ICD-10-CM

## 2023-03-06 DIAGNOSIS — Z882 Allergy status to sulfonamides status: Secondary | ICD-10-CM | POA: Diagnosis not present

## 2023-03-06 DIAGNOSIS — G894 Chronic pain syndrome: Secondary | ICD-10-CM | POA: Diagnosis present

## 2023-03-06 DIAGNOSIS — G9341 Metabolic encephalopathy: Secondary | ICD-10-CM | POA: Diagnosis present

## 2023-03-06 DIAGNOSIS — Z79890 Hormone replacement therapy (postmenopausal): Secondary | ICD-10-CM | POA: Diagnosis not present

## 2023-03-06 DIAGNOSIS — F3131 Bipolar disorder, current episode depressed, mild: Secondary | ICD-10-CM | POA: Diagnosis present

## 2023-03-06 LAB — CBC WITH DIFFERENTIAL/PLATELET
Abs Immature Granulocytes: 0.11 10*3/uL — ABNORMAL HIGH (ref 0.00–0.07)
Basophils Absolute: 0 10*3/uL (ref 0.0–0.1)
Basophils Relative: 0 %
Eosinophils Absolute: 1.3 10*3/uL — ABNORMAL HIGH (ref 0.0–0.5)
Eosinophils Relative: 14 %
HCT: 33.6 % — ABNORMAL LOW (ref 36.0–46.0)
Hemoglobin: 10.3 g/dL — ABNORMAL LOW (ref 12.0–15.0)
Immature Granulocytes: 1 %
Lymphocytes Relative: 30 %
Lymphs Abs: 2.7 10*3/uL (ref 0.7–4.0)
MCH: 31.4 pg (ref 26.0–34.0)
MCHC: 30.7 g/dL (ref 30.0–36.0)
MCV: 102.4 fL — ABNORMAL HIGH (ref 80.0–100.0)
Monocytes Absolute: 0.9 10*3/uL (ref 0.1–1.0)
Monocytes Relative: 10 %
Neutro Abs: 4 10*3/uL (ref 1.7–7.7)
Neutrophils Relative %: 45 %
Platelets: 315 10*3/uL (ref 150–400)
RBC: 3.28 MIL/uL — ABNORMAL LOW (ref 3.87–5.11)
RDW: 14.4 % (ref 11.5–15.5)
WBC: 9.1 10*3/uL (ref 4.0–10.5)
nRBC: 0 % (ref 0.0–0.2)

## 2023-03-06 LAB — APTT: aPTT: 27 s (ref 24–36)

## 2023-03-06 LAB — BLOOD GAS, ARTERIAL
Acid-Base Excess: 2.2 mmol/L — ABNORMAL HIGH (ref 0.0–2.0)
Bicarbonate: 27.8 mmol/L (ref 20.0–28.0)
Drawn by: 22179
O2 Saturation: 99.5 %
Patient temperature: 36.8
pCO2 arterial: 46 mm[Hg] (ref 32–48)
pH, Arterial: 7.39 (ref 7.35–7.45)
pO2, Arterial: 135 mm[Hg] — ABNORMAL HIGH (ref 83–108)

## 2023-03-06 LAB — FERRITIN: Ferritin: 74 ng/mL (ref 11–307)

## 2023-03-06 LAB — COMPREHENSIVE METABOLIC PANEL
ALT: 18 U/L (ref 0–44)
AST: 23 U/L (ref 15–41)
Albumin: 3.5 g/dL (ref 3.5–5.0)
Alkaline Phosphatase: 67 U/L (ref 38–126)
Anion gap: 11 (ref 5–15)
BUN: 17 mg/dL (ref 8–23)
CO2: 26 mmol/L (ref 22–32)
Calcium: 9.1 mg/dL (ref 8.9–10.3)
Chloride: 100 mmol/L (ref 98–111)
Creatinine, Ser: 0.8 mg/dL (ref 0.44–1.00)
GFR, Estimated: >60 mL/min (ref >60)
Glucose, Bld: 83 mg/dL (ref 70–99)
Potassium: 4.1 mmol/L (ref 3.5–5.1)
Sodium: 137 mmol/L (ref 135–145)
Total Bilirubin: 0.8 mg/dL (ref 0.0–1.2)
Total Protein: 6.7 g/dL (ref 6.5–8.1)

## 2023-03-06 LAB — RESP PANEL BY RT-PCR (RSV, FLU A&B, COVID)  RVPGX2
Influenza A by PCR: NEGATIVE
Influenza B by PCR: NEGATIVE
Resp Syncytial Virus by PCR: NEGATIVE
SARS Coronavirus 2 by RT PCR: POSITIVE — AB

## 2023-03-06 LAB — D-DIMER, QUANTITATIVE: D-Dimer, Quant: 1.53 ug{FEU}/mL — ABNORMAL HIGH (ref 0.00–0.50)

## 2023-03-06 LAB — LACTATE DEHYDROGENASE: LDH: 255 U/L — ABNORMAL HIGH (ref 98–192)

## 2023-03-06 LAB — LACTIC ACID, PLASMA: Lactic Acid, Venous: 1.4 mmol/L (ref 0.5–1.9)

## 2023-03-06 LAB — PROTIME-INR
INR: 1 (ref 0.8–1.2)
Prothrombin Time: 13 s (ref 11.4–15.2)

## 2023-03-06 MED ORDER — VANCOMYCIN HCL IN DEXTROSE 1-5 GM/200ML-% IV SOLN
1000.0000 mg | Freq: Once | INTRAVENOUS | Status: AC
  Administered 2023-03-06: 1000 mg via INTRAVENOUS
  Filled 2023-03-06: qty 200

## 2023-03-06 MED ORDER — ENOXAPARIN SODIUM 40 MG/0.4ML IJ SOSY
40.0000 mg | PREFILLED_SYRINGE | INTRAMUSCULAR | Status: DC
  Administered 2023-03-07 – 2023-03-11 (×5): 40 mg via SUBCUTANEOUS
  Filled 2023-03-06 (×5): qty 0.4

## 2023-03-06 MED ORDER — LEVOTHYROXINE SODIUM 25 MCG PO TABS
25.0000 ug | ORAL_TABLET | Freq: Every day | ORAL | Status: DC
Start: 2023-03-07 — End: 2023-03-12
  Administered 2023-03-07 – 2023-03-11 (×5): 25 ug via ORAL
  Filled 2023-03-06 (×5): qty 1

## 2023-03-06 MED ORDER — ACETAMINOPHEN 325 MG PO TABS
650.0000 mg | ORAL_TABLET | Freq: Four times a day (QID) | ORAL | Status: DC | PRN
  Administered 2023-03-09 – 2023-03-10 (×2): 650 mg via ORAL
  Filled 2023-03-06 (×2): qty 2

## 2023-03-06 MED ORDER — SODIUM CHLORIDE 0.9 % IV SOLN: INTRAVENOUS | Status: AC

## 2023-03-06 MED ORDER — METHYLPREDNISOLONE SODIUM SUCC 40 MG IJ SOLR
0.5000 mg/kg | Freq: Two times a day (BID) | INTRAMUSCULAR | Status: AC
  Administered 2023-03-07 – 2023-03-09 (×6): 26 mg via INTRAVENOUS
  Filled 2023-03-06 (×6): qty 1

## 2023-03-06 MED ORDER — LACTATED RINGERS IV SOLN: INTRAVENOUS | Status: DC

## 2023-03-06 MED ORDER — ONDANSETRON HCL 4 MG/2ML IJ SOLN: 4.0000 mg | Freq: Four times a day (QID) | INTRAMUSCULAR | Status: DC | PRN

## 2023-03-06 MED ORDER — ACETAMINOPHEN 650 MG RE SUPP: 650.0000 mg | Freq: Four times a day (QID) | RECTAL | Status: DC | PRN

## 2023-03-06 MED ORDER — ONDANSETRON HCL 4 MG PO TABS: 4.0000 mg | ORAL_TABLET | Freq: Four times a day (QID) | ORAL | Status: DC | PRN

## 2023-03-06 MED ORDER — METHYLPREDNISOLONE SODIUM SUCC 125 MG IJ SOLR
60.0000 mg | Freq: Once | INTRAMUSCULAR | Status: AC
  Administered 2023-03-07: 60 mg via INTRAVENOUS
  Filled 2023-03-06: qty 2

## 2023-03-06 MED ORDER — GUAIFENESIN-DM 100-10 MG/5ML PO SYRP: 15.0000 mL | ORAL_SOLUTION | Freq: Three times a day (TID) | ORAL | Status: DC | PRN

## 2023-03-06 MED ORDER — LORAZEPAM 2 MG/ML IJ SOLN
1.0000 mg | Freq: Once | INTRAMUSCULAR | Status: AC
  Administered 2023-03-06: 1 mg via INTRAVENOUS
  Filled 2023-03-06: qty 1

## 2023-03-06 MED ORDER — METOPROLOL TARTRATE 25 MG PO TABS
12.5000 mg | ORAL_TABLET | Freq: Two times a day (BID) | ORAL | Status: DC
  Administered 2023-03-07 – 2023-03-11 (×10): 12.5 mg via ORAL
  Filled 2023-03-06 (×10): qty 1

## 2023-03-06 MED ORDER — ALBUTEROL SULFATE (2.5 MG/3ML) 0.083% IN NEBU
2.5000 mg | INHALATION_SOLUTION | RESPIRATORY_TRACT | Status: DC | PRN
  Administered 2023-03-07 – 2023-03-10 (×2): 2.5 mg via RESPIRATORY_TRACT
  Filled 2023-03-06 (×2): qty 3

## 2023-03-06 MED ORDER — SODIUM CHLORIDE 0.9 % IV SOLN
2.0000 g | Freq: Once | INTRAVENOUS | Status: AC
  Administered 2023-03-06: 2 g via INTRAVENOUS
  Filled 2023-03-06: qty 12.5

## 2023-03-06 MED ORDER — MOMETASONE FURO-FORMOTEROL FUM 200-5 MCG/ACT IN AERO
2.0000 | INHALATION_SPRAY | Freq: Two times a day (BID) | RESPIRATORY_TRACT | Status: DC
  Administered 2023-03-07 – 2023-03-10 (×4): 2 via RESPIRATORY_TRACT
  Filled 2023-03-06: qty 8.8

## 2023-03-06 MED ORDER — LORAZEPAM 2 MG/ML IJ SOLN
0.5000 mg | INTRAMUSCULAR | Status: DC | PRN
  Administered 2023-03-07 – 2023-03-09 (×7): 0.5 mg via INTRAVENOUS
  Filled 2023-03-06 (×7): qty 1

## 2023-03-06 MED ORDER — IOHEXOL 300 MG/ML  SOLN
100.0000 mL | Freq: Once | INTRAMUSCULAR | Status: AC | PRN
  Administered 2023-03-06: 100 mL via INTRAVENOUS

## 2023-03-06 MED ORDER — POLYETHYLENE GLYCOL 3350 17 G PO PACK: 17.0000 g | PACK | Freq: Every day | ORAL | Status: DC | PRN

## 2023-03-06 MED ORDER — NIRMATRELVIR/RITONAVIR (PAXLOVID)TABLET
3.0000 | ORAL_TABLET | Freq: Two times a day (BID) | ORAL | Status: DC
  Administered 2023-03-07 (×2): 3 via ORAL
  Filled 2023-03-06: qty 30

## 2023-03-06 MED ORDER — PREDNISONE 20 MG PO TABS
50.0000 mg | ORAL_TABLET | Freq: Every day | ORAL | Status: DC
  Administered 2023-03-10 – 2023-03-11 (×2): 50 mg via ORAL
  Filled 2023-03-06 (×2): qty 3

## 2023-03-06 NOTE — H&P (Addendum)
History and Physical    Heidi Fuentes:[id number] DOB: December 17, 1945 DOA: 03/06/2023  PCP: [name], DO   Patient coming from: Home  I have personally briefly reviewed patient's old medical records in [facility] Link  Chief Complaint: AMS  HPI: Heidi Fuentes is a 78 y.o. female with medical history significant for asthma, bipolar disorder, depression, hypertension, hypothyroidism, fibromyalgia. Patient was brought to the ED reports of altered mental status, cough and difficulty breathing.  In the time of my evaluation, he is unable to answer questions, she is sedated, patient is status post 1 mg of Ativan, arouses to touch, speech is incomprehensible, and drifts back to sleep.  Per triage notes-EMS was called for above symptoms, they report patient stopped breathing at some point en route, per ED provider does not confirm this occurred, did not get this in signout from EMS. EMS was called out earlier today to patients home by patients nurse-  patient was awake and alert and refused transport at that time, but with change in mental status, patient was unable to talk or walk, EMS was called again-reports O2 sats of 86% with gurgling respiration.  She was placed on 2 L.  Recent hospitalization 1/22 to 1/26 for rectal prolapse, patient had XI robotic assisted rectopexy by Dr. [name], Dr. [name].  ED Course: Temperature 97.4.  Heart rate 66-113.  Respiratory rate mostly below 20, range- 15-41.  Blood pressure systolic 137-1 52.  O2 sats down to 87% on room air. WBC 9.1. COVID-positive. ABG with pH of 7.36, pCO2 of 46. Chest x-ray shows airway thickening, bronchitis or reactive airway disease. CT abdomen and pelvis with contrast-posterior perirectal hemorrhage status post rectopexy, with mass effect on the rectum.  Additional associated edema and fluid in the sigmoid mesocolon. EDP talked to Dr. [name] with general surgery,-okay to admit here, monitor, small enough that hemorrhage will  resolve, no need for transfer.  Review of Systems: Unable to ascertain, patient sedated, with altered mental status.  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[facility])     Past Surgical History:  Procedure Laterality Date   CATARACT EXTRACTION W/PHACO Right 10/06/2016   Procedure: CATARACT EXTRACTION PHACO AND INTRAOCULAR LENS PLACEMENT (IOC);  Surgeon: Galen Manila, MD;  Location: [facility] ORS;  Service: Ophthalmology;  Laterality: Right;  [place] 00:32.5 AP% 14.5 CDE 4.71 Fluid pack lot # [id number]   CATARACT EXTRACTION W/PHACO Left 11/03/2016   Procedure: CATARACT EXTRACTION PHACO AND INTRAOCULAR LENS PLACEMENT (IOC);  Surgeon: Galen Manila, MD;  Location: [facility] ORS;  Service: Ophthalmology;  Laterality: Left;  [place] 00:36.6 AP% 16.0 CDE 5.86 Fluid Pack lot # [id number]   COCCYX REMOVAL     colonoscopy  2005   Dr. Karilyn Cota: mild melanosis coli, otherwise normal   COLONOSCOPY WITH PROPOFOL N/A 09/14/2013  QMV:HQIONGEXB coli. Colonic diverticulosis. Single colonic. Tubular adenoma. Next TCS 09/2020.   COLONOSCOPY WITH PROPOFOL N/A 07/24/2021   Procedure: COLONOSCOPY WITH PROPOFOL;  Surgeon: Corbin Ade, MD;  [facility];  Service: Endoscopy;  Laterality: N/A;  2:00pm   ERCP  1997   [facility]: biliary manometry abnormal, subsequent sphincterotomy    ESOPHAGEAL MANOMETRY N/A 12/21/2016   Surgeon: Napoleon Form, MD; EG junction outflow obstruction which could be  secondary to large hiatal hernia and possible coiling of catheter.  Not consistent with achalasia or other variant.  No major peristaltic motility disorder.   ESOPHAGOGASTRODUODENOSCOPY (EGD) WITH PROPOFOL N/A 09/14/2013   MWU:XLKGMWNU'U ring. Hiatal hernia. Status post Elease Hashimoto and biopsy disruption.    FOOT SURGERY Bilateral    Bunionectomy and hammer toes   HEMORROIDECTOMY     HERNIA REPAIR     inguinal right   LAPAROSCOPIC PARAESOPHAGEAL HERNIA REPAIR  02/2017   Dr. Carolynn [facility])   [place] DILATION N/A 09/14/2013   Procedure: Alvy Beal;  Surgeon: Corbin Ade, MD;  Location: [facility] OR[facility];  Service: Endoscopy;  Laterality: N/A;  56   POLYPECTOMY N/A 09/14/2013   Procedure: POLYPECTOMY;  Surgeon: Corbin Ade, MD;  Location: [facility] ORS;  Service: Endoscopy;  Laterality: N/A;   POLYPECTOMY  07/24/2021   Procedure: POLYPECTOMY;  Surgeon: Corbin Ade, MD;  Location: [facility];  Service: Endoscopy;;   TONSILLECTOMY     XI ROBOT ASSISTED RECTOPEXY N/A 02/24/2023   Procedure: XI ROBOT ASSISTED RECTOPEXY;  Surgeon: Romie Levee, MD;  Location: [facility] ORS;  Service: General;  Laterality: N/A;     reports that she has never smoked. She has never used smokeless tobacco. She reports that she does not drink alcohol and does not use drugs.  Allergies  Allergen Reactions   Sulfa Antibiotics Rash and Hives   Mirtazapine Other (See Comments)    HA's   Trazodone Other (See Comments)   Trazodone Hcl Other (See Comments)    strange dreams   Ziprasidone Other (See Comments)    cardiac issues   Aripiprazole Other (See Comments)    Bad dreams   Quetiapine Palpitations   Trazodone And Nefazodone Other (See Comments)    unknown   Ziprasidone Hcl Other (See Comments)    hospitalization specifics    Family History  Problem Relation Age of Onset   Lung cancer Mother    Heart attack Father    Bone cancer Brother    COPD Sister    Colon cancer Neg Hx     Prior to  Admission medications   Medication Sig Start Date End Date Taking? Authorizing Provider  acetaminophen (TYLENOL) 500 MG tablet Take 500 mg by mouth every 6 (six) hours as needed for mild pain or moderate pain. For pain    [provider]  BREZTRI AEROSPHERE 160-9-4.8 MCG/ACT AERO Inhale into the lungs. 12/04/22   [provider]  buPROPion (WELLBUTRIN) 75 MG tablet Take 1 tablet (75 mg total) by mouth 2 (two) times daily. 07/28/22   Vassie Loll, MD  diphenhydrAMINE (BENADRYL) 25 mg capsule Take 25 mg by mouth every 6 (six) hours as needed for allergies.    [provider]  FLUoxetine (PROZAC) 40 MG capsule Take 40 mg by mouth daily.    [provider]  fluticasone (FLONASE) 50 MCG/ACT nasal spray Place 2 sprays into both nostrils daily.    [provider]  gabapentin (NEURONTIN) 300 MG capsule Take 600 mg by  mouth 4 (four) times daily.    [provider]  ketoconazole (NIZORAL) 2 % cream Apply 1 Application topically daily as needed for irritation.    [provider]  lamoTRIgine (LAMICTAL) 200 MG tablet Take 200 mg by mouth 2 (two) times daily. 06/05/19   [provider]  latanoprost (XALATAN) 0.005 % ophthalmic solution 1 drop daily. 12/17/22   [provider]  levothyroxine (SYNTHROID) 25 MCG tablet Take 25 mcg by mouth daily before breakfast.    [provider]  Lidocaine-Hydrocortisone Ace 3-0.5 % CREA Apply pea size amount to anorectum 4 times a day as needed 01/05/23   Rourk, Gerrit Friends, MD  losartan (COZAAR) 25 MG tablet Take 1 tablet (25 mg total) by mouth daily. 08/14/20   Danford, Earl Lites, MD  melatonin 5 MG TABS Take 5 mg by mouth at bedtime. 11/17/17   [provider]  metoprolol tartrate (LOPRESSOR) 25 MG tablet Take 1 tablet (25 mg total) by mouth 2 (two) times daily. Patient taking differently: Take 12.5 mg by mouth 2 (two) times daily. 11/12/22 02/24/23  Shahmehdi, Gemma Payor, MD   montelukast (SINGULAIR) 10 MG tablet Take 10 mg by mouth daily. 05/03/13   [provider]  Oxycodone HCl 10 MG TABS Take 10 mg by mouth every 8 (eight) hours as needed. 12/26/22   [provider]  pantoprazole (PROTONIX) 20 MG tablet SMARTSIG:1.0 Tablet(s) By Mouth Daily 12/04/22   [provider]  polycarbophil (FIBERCON) 625 MG tablet Take 1 tablet (625 mg total) by mouth daily. 02/28/23   Romie Levee, MD  SYMBICORT 160-4.5 MCG/ACT inhaler Inhale 2 puffs into the lungs in the morning and at bedtime. 05/25/22   [provider]  VENTOLIN HFA 108 (90 Base) MCG/ACT inhaler SMARTSIG:1 Puff(s) By Mouth Every 4 Hours PRN 12/08/22   [provider]    Physical Exam: Exam limited by altered mental status, sedation Vitals:   03/06/23 1630 03/06/23 1631 03/06/23 1730 03/06/23 1830  BP: 137/66  (!) 126/55 (!) 151/73  Pulse: 69  66 70  Resp:   15 14  SpO2: 100% 100% 97% 98%    Constitutional: Sedated, Vitals:   03/06/23 1630 03/06/23 1631 03/06/23 1730 03/06/23 1830  BP: 137/66  (!) 126/55 (!) 151/73  Pulse: 69  66 70  Resp:   15 14  SpO2: 100% 100% 97% 98%   Eyes: PERRL, lids and conjunctivae normal ENMT: Mucous membranes are moist.  Neck: normal, supple, no masses, no thyromegaly Respiratory: Anterior auscultation, clear to auscultation bilaterally, no wheezing, no crackles. Normal respiratory effort. No accessory muscle use.  Cardiovascular: Regular rate and rhythm, no murmurs / rubs / gallops. No extremity edema.  Extremities warm. Abdomen: no tenderness, no masses palpated. No hepatosplenomegaly. Bowel sounds positive.  Musculoskeletal: no clubbing / cyanosis. No joint deformity upper and lower extremities.  Skin: no rashes, lesions, ulcers. No induration Neurologic: Limited by altered mental status, sedation,.  Psychiatric: Exam limited by AMS, sedation  Labs on Admission: I have personally reviewed following labs and imaging  studies  CBC: Recent Labs  Lab 03/06/23 1641  WBC 9.1  NEUTROABS 4.0  HGB 10.3*  HCT 33.6*  MCV 102.4*  PLT 315   Basic Metabolic Panel: Recent Labs  Lab 03/06/23 1641  NA 137  K 4.1  CL 100  CO2 26  GLUCOSE 83  BUN 17  CREATININE 0.80  CALCIUM 9.1   GFR: Estimated Creatinine Clearance: 44 mL/min (by C-G formula based on SCr  of 0.8 mg/dL). Liver Function Tests: Recent Labs  Lab 03/06/23 1641  AST 23  ALT 18  ALKPHOS 67  BILITOT 0.8  PROT 6.7  ALBUMIN 3.5    Recent Labs  Lab 03/06/23 1641  INR 1.0   Urine analysis:    Component Value Date/Time   COLORURINE YELLOW 11/11/2022 0404   APPEARANCEUR CLEAR 11/11/2022 0404   APPEARANCEUR Cloudy 06/02/2012 1042   LABSPEC 1.020 11/11/2022 0404   LABSPEC 1.017 06/02/2012 1042   PHURINE 6.0 11/11/2022 0404   GLUCOSEU NEGATIVE 11/11/2022 0404   GLUCOSEU Negative 06/02/2012 1042   HGBUR MODERATE (A) 11/11/2022 0404   BILIRUBINUR NEGATIVE 11/11/2022 0404   BILIRUBINUR Negative 06/02/2012 1042   KETONESUR 20 (A) 11/11/2022 0404   PROTEINUR 100 (A) 11/11/2022 0404   UROBILINOGEN 0.2 12/11/2014 1429   NITRITE NEGATIVE 11/11/2022 0404   LEUKOCYTESUR NEGATIVE 11/11/2022 0404   LEUKOCYTESUR 3+ 06/02/2012 1042    Radiological Exams on Admission: CT ABDOMEN PELVIS W CONTRAST Result Date: 03/06/2023 CLINICAL DATA:  Sepsis, recent bowel surgery. EXAM: CT ABDOMEN AND PELVIS WITH CONTRAST TECHNIQUE: Multidetector CT imaging of the abdomen and pelvis was performed using the standard protocol following bolus administration of intravenous contrast. RADIATION DOSE REDUCTION: This exam was performed according to the departmental dose-optimization program which includes automated exposure control, adjustment of the mA and/or kV according to patient size and/or use of iterative reconstruction technique. CONTRAST:  OMNIPAQUE IOHEXOL 300 MG/ML  SOLN COMPARISON:  Report of 11/09/2022 is reviewed. Images are not available at the  time of dictation. FINDINGS: Lower chest: Dependent atelectasis bilaterally. Atherosclerotic calcification of the aorta. Heart is enlarged. No pericardial effusion. There may be trace left pleural fluid. Hepatobiliary: Liver and gallbladder are unremarkable. No biliary ductal dilatation. Pancreas: Negative. Spleen: Negative. Adrenals/Urinary Tract: Adrenal glands are unremarkable. Subcentimeter low-attenuation lesions in the right kidney, too small to characterize. No specific follow-up necessary. Kidneys are otherwise unremarkable. Ureters are decompressed. Bladder is grossly unremarkable. Stomach/Bowel: Hiatal hernia repair. Stomach and small bowel are unremarkable. Appendix is not readily identified. Moderate stool burden. Patient is status post rectopexy. Posterior perirectal hyperattenuating fluid collection measures 5.0 x 8.3 cm and exerts mass effect on the rectum. Additional edema and fluid in the sigmoid mesocolon. Vascular/Lymphatic: Atherosclerotic calcification of the aorta. No pathologically enlarged lymph nodes. Reproductive: Uterus is visualized.  No adnexal mass. Other: Small bilateral inguinal hernias contain fat. Tiny umbilical hernia contains fat. No free fluid. Musculoskeletal: Degenerative changes in the spine. Grade 1 anterolisthesis of L4 on L5. Old L2 compression fracture. Old upper sacral fracture. Mild anterior wedging of T11, old. IMPRESSION: 1. Posterior perirectal hemorrhage status post rectopexy 02/24/2023, with mass effect on the rectum. Additional associated edema and fluid in the sigmoid mesocolon. 2. Trace left pleural fluid. 3.  Aortic atherosclerosis (ICD10-I70.0). Electronically Signed   By: Leanna Battles M.D.   On: 03/06/2023 19:17   DG Chest Port 1 View Result Date: 03/06/2023 CLINICAL DATA:  Shortness of breath, dyspnea.  Wheezing. EXAM: PORTABLE CHEST 1 VIEW COMPARISON:  07/15/2022 and chest CT from 11/09/2022 FINDINGS: Atherosclerotic calcification of the aortic arch.  Mild enlargement of the cardiopericardial silhouette without edema. Airway thickening is present, suggesting bronchitis or reactive airways disease. Degenerative glenohumeral arthropathy bilaterally. No blunting of the costophrenic angles. IMPRESSION: 1. Airway thickening is present, suggesting bronchitis or reactive airways disease. 2. Mild enlargement of the cardiopericardial silhouette without edema. 3. Aortic Atherosclerosis (ICD10-I70.0). Electronically Signed   By: Gaylyn Rong M.D.   On: 03/06/2023  17:02    EKG: Independently reviewed.  Sinus rhythm, rate 67, QTc 450.  No significant change from prior.  Assessment/Plan Principal Problem:   Acute metabolic encephalopathy Active Problems:   COVID-19 virus infection   Acute hypoxic respiratory failure (HCC)   Asthma, chronic   Bipolar affective disorder, depressed, mild (HCC)   Chronic pain syndrome   Essential hypertension   Assessment and Plan: * Acute metabolic encephalopathy On my evaluation, patient is sedated status post Ativan, but she awakens to voice but speech is incomprehensible.  Initial AMS likely secondary to-COVID infection, hypoxia.  Patient's son reported patient has had several falls recently, combative with caregivers, on narcotic pain medications, and takes excessive amounts of gabapentin, noncompliant with psych meds.  ABG with normal pH and pCO2. -Obtain head CT -Ativan 0.5Q4 hourly as needed for agitation -PT eval when able, TOC consult, patient will need placement - Wellbutrin, Prozac, lamotrigine on home med list, awaiting med rec -LR 150 cc/h started in ED, will continue with N/s 100cc/hr x 15hrs -Follow-up UA -Follow blood cultures ordered in ED -Vanco and cefepime started in ED, hold off on broad-spectrum antibiotics at this time  Acute hypoxic respiratory failure (HCC) O2 sats down to 87% on room air, on 2 L. Likely COVID virus bronchitis as etiology for hypoxia.  Chest x-ray suggesting bronchitis  or reactive airway disease.  CT shows trace pleural fluid.  COVID-positive.  History of asthma.  Lung exam unremarkable. -ABG pH of 7.39, pCO2 of 46. -Hold off on further imaging for now, patient just received contrast load for CT abdomen  COVID-19 virus infection Presenting with hypoxia, cough, dyspnea.  Chest x-ray suggestive of bronchitis or reactive airway disease.  At this time no suggestion of thromboembolic etiology, patient also just received contrast for CT abdomen, deferred further chest imaging. - With hypoxia will start Solu-Medrol-60 mg x 1, continue with weight based dosing - Paxlovid -Obtain and trend inflammatory markers -Pending clinical course, consider further imaging  Rectal prolapse Recent hospitalization 1/22 to 1/26 for rectal prolapse, patient had XI robotic assisted rectopexy by Dr. [name], Dr. [name]. -CT abdomen and pelvis with contrast today- Posterior perirectal hemorrhage status post rectopexy 02/24/2023, with mass effect on the rectum. Additional associated edema and fluid in the sigmoid mesocolon - EDP to Dr. [name] with general surgery,-okay to admit here, monitor, small enough that hemorrhage will resolve, no need for transfer. -Trend hemoglobin   Bipolar affective disorder, depressed, mild (HCC) Patient noncompliant with psych meds. -Resume Prozac, Wellbutrin and LAMA treatment.  Asthma, chronic Lung exam unremarkable. -Albuterol nebs as needed' -Resume home regimen   DVT prophylaxis: Lovenox Code Status: FULL code Family Communication: None at bedside currently. Disposition Plan: ~ 2 days Consults called: None Admission status: Inpt Tele I certify that at the point of admission it is my clinical judgment that the patient will require inpatient hospital care spanning beyond 2 midnights from the point of admission due to high intensity of service, high risk for further deterioration and high frequency of surveillance required.    Author: Onnie Boer, MD 03/06/2023 10:56 PM  For on call review [email or web address].

## 2023-03-06 NOTE — ED Notes (Signed)
Son is here, but doesn't want to see her, because she will be more aggitated. According to him, she has had several falls recently, Did have round-the-clock nursing until recently. Was living independently. Combative with the caregivers. Recent prolapsed rectum with surgical repair on 02/23/2023. History of bipolar disorder. Takes 90 days of oxycodone in 30 days. Also taking excessive amounts of Gabapentin. Trying to get legal guardianship through the court system, because she will not remain compliant with psych meds. Fuller Plan reported this.

## 2023-03-06 NOTE — ED Provider Notes (Signed)
[place] [facility] AT [facility] Provider Note   CSN: [id number] Arrival date & time: 03/06/23  1620     History  Chief Complaint  Patient presents with   Shortness of Breath    Heidi Fuentes is a 78 y.o. female.   Shortness of Breath    This patient is a 78 year old female, she recently underwent surgery for a prolapsed rectum approximately 10 days ago, by colorectal surgery in [place], this was robot-assisted surgery, and the patient was discharged on February 28, 2023.  According to the patient's nurses who are at home with her she has had some increasing coughing and a call went out to the paramedics earlier today because of shortness of breath and "sick person".  At that time the patient was awake and alert and refusing transport.  They were called back out by the same staff because the patient was getting worse and was now altered and not able to stand to talk or walk.  When they arrived they found the patient's oxygen to be 86% with a gurgling respirations.  Placed her on oxygen and transported her to the hospital  Home Medications Prior to Admission medications   Medication Sig Start Date End Date Taking? Authorizing Provider  acetaminophen (TYLENOL) 500 MG tablet Take 500 mg by mouth every 6 (six) hours as needed for mild pain or moderate pain. For pain    [provider]  BREZTRI AEROSPHERE 160-9-4.8 MCG/ACT AERO Inhale into the lungs. 12/04/22   [provider]  buPROPion (WELLBUTRIN) 75 MG tablet Take 1 tablet (75 mg total) by mouth 2 (two) times daily. 07/28/22   Vassie Loll, MD  diphenhydrAMINE (BENADRYL) 25 mg capsule Take 25 mg by mouth every 6 (six) hours as needed for allergies.    [provider]  FLUoxetine (PROZAC) 40 MG capsule Take 40 mg by mouth daily.    [provider]  fluticasone (FLONASE) 50 MCG/ACT nasal spray Place 2 sprays into both nostrils daily.    [provider]  gabapentin  (NEURONTIN) 300 MG capsule Take 600 mg by mouth 4 (four) times daily.    [provider]  ketoconazole (NIZORAL) 2 % cream Apply 1 Application topically daily as needed for irritation.    [provider]  lamoTRIgine (LAMICTAL) 200 MG tablet Take 200 mg by mouth 2 (two) times daily. 06/05/19   [provider]  latanoprost (XALATAN) 0.005 % ophthalmic solution 1 drop daily. 12/17/22   [provider]  levothyroxine (SYNTHROID) 25 MCG tablet Take 25 mcg by mouth daily before breakfast.    [provider]  Lidocaine-Hydrocortisone Ace 3-0.5 % CREA Apply pea size amount to anorectum 4 times a day as needed 01/05/23   Rourk, Gerrit Friends, MD  losartan (COZAAR) 25 MG tablet Take 1 tablet (25 mg total) by mouth daily. 08/14/20   Danford, Earl Lites, MD  melatonin 5 MG TABS Take 5 mg by mouth at bedtime. 11/17/17   [provider]  metoprolol tartrate (LOPRESSOR) 25 MG tablet Take 1 tablet (25 mg total) by mouth 2 (two) times daily. Patient taking differently: Take 12.5 mg by mouth 2 (two) times daily. 11/12/22 02/24/23  Shahmehdi, Gemma Payor, MD  montelukast (SINGULAIR) 10 MG tablet Take 10 mg by mouth daily. 05/03/13   [provider]  Oxycodone HCl 10 MG TABS Take 10 mg by mouth every 8 (eight) hours as needed. 12/26/22   [provider]  pantoprazole (PROTONIX) 20 MG tablet  SMARTSIG:1.0 Tablet(s) By Mouth Daily 12/04/22   [provider]  polycarbophil (FIBERCON) 625 MG tablet Take 1 tablet (625 mg total) by mouth daily. 02/28/23   Romie Levee, MD  SYMBICORT 160-4.5 MCG/ACT inhaler Inhale 2 puffs into the lungs in the morning and at bedtime. 05/25/22   [provider]  VENTOLIN HFA 108 (90 Base) MCG/ACT inhaler SMARTSIG:1 Puff(s) By Mouth Every 4 Hours PRN 12/08/22   [provider]      Allergies    Sulfa antibiotics, Mirtazapine, Trazodone, Trazodone hcl, Ziprasidone, Aripiprazole, Quetiapine, Trazodone and  nefazodone, and Ziprasidone hcl    Review of Systems   Review of Systems  Unable to perform ROS: Mental status change  Respiratory:  Positive for shortness of breath.     Physical Exam Updated Vital Signs BP (!) 151/73 (BP Location: Left Arm)   Pulse 70   Resp 14   SpO2 98%  Physical Exam Vitals and nursing note reviewed.  Constitutional:      General: She is in acute distress.     Appearance: She is well-developed. She is ill-appearing.     Comments: Somnolent but arousable to voice  HENT:     Head: Normocephalic and atraumatic.     Mouth/Throat:     Mouth: Mucous membranes are dry.     Pharynx: No oropharyngeal exudate.  Eyes:     General: No scleral icterus.       Right eye: No discharge.        Left eye: No discharge.     Conjunctiva/sclera: Conjunctivae normal.     Pupils: Pupils are equal, round, and reactive to light.  Neck:     Thyroid: No thyromegaly.     Vascular: No JVD.  Cardiovascular:     Rate and Rhythm: Regular rhythm. Tachycardia present.     Heart sounds: Normal heart sounds. No murmur heard.    No friction rub. No gallop.  Pulmonary:     Effort: No respiratory distress.     Breath sounds: Rales present. No wheezing.     Comments: Tachypnea, rales and decreased breath sounds at the right base Abdominal:     General: Bowel sounds are normal. There is no distension.     Palpations: Abdomen is soft. There is no mass.     Tenderness: There is no abdominal tenderness.     Comments: Abdomen is soft, not distended, the laparoscopic sites appear to be intact without surrounding induration or redness, minimal bruising surrounding the sites  Musculoskeletal:        General: No tenderness, deformity or signs of injury. Normal range of motion.     Cervical back: Normal range of motion and neck supple.     Right lower leg: No edema.     Left lower leg: No edema.  Lymphadenopathy:     Cervical: No cervical adenopathy.  Skin:    General: Skin is warm and dry.      Findings: Bruising present. No erythema or rash.  Neurological:     Comments: The patient is somnolent, altered, not able to follow commands other than minimally able to sit up, when I ask her to grip she does not understand, she does move both arms and legs  Psychiatric:        Behavior: Behavior normal.     ED Results / Procedures / Treatments   Labs (all labs ordered are listed, but only abnormal results are displayed) Labs Reviewed  RESP PANEL BY RT-PCR (RSV, FLU  A&B, COVID)  RVPGX2 - Abnormal; Notable for the following components:      Result Value   SARS Coronavirus 2 by RT PCR POSITIVE (*)    All other components within normal limits  CBC WITH DIFFERENTIAL/PLATELET - Abnormal; Notable for the following components:   RBC 3.28 (*)    Hemoglobin 10.3 (*)    HCT 33.6 (*)    MCV 102.4 (*)    Eosinophils Absolute 1.3 (*)    Abs Immature Granulocytes 0.11 (*)    All other components within normal limits  BLOOD GAS, ARTERIAL - Abnormal; Notable for the following components:   pO2, Arterial 135 (*)    Acid-Base Excess 2.2 (*)    All other components within normal limits  CULTURE, BLOOD (ROUTINE X 2)  CULTURE, BLOOD (ROUTINE X 2)  LACTIC ACID, PLASMA  COMPREHENSIVE METABOLIC PANEL  PROTIME-INR  APTT  URINALYSIS, W/ REFLEX TO CULTURE (INFECTION SUSPECTED)  LACTIC ACID, PLASMA    EKG EKG Interpretation Date/Time:  Saturday March 06 2023 16:33:02 EST Ventricular Rate:  67 PR Interval:  139 QRS Duration:  91 QT Interval:  426 QTC Calculation: 450 R Axis:   1  Text Interpretation: Sinus rhythm LVH by voltage Confirmed by Eber Hong ([id number]) on 03/06/2023 4:56:50 PM  Radiology CT ABDOMEN PELVIS W CONTRAST Result Date: 03/06/2023 CLINICAL DATA:  Sepsis, recent bowel surgery. EXAM: CT ABDOMEN AND PELVIS WITH CONTRAST TECHNIQUE: Multidetector CT imaging of the abdomen and pelvis was performed using the standard protocol following bolus administration of intravenous  contrast. RADIATION DOSE REDUCTION: This exam was performed according to the departmental dose-optimization program which includes automated exposure control, adjustment of the mA and/or kV according to patient size and/or use of iterative reconstruction technique. CONTRAST:  OMNIPAQUE IOHEXOL 300 MG/ML  SOLN COMPARISON:  Report of 11/09/2022 is reviewed. Images are not available at the time of dictation. FINDINGS: Lower chest: Dependent atelectasis bilaterally. Atherosclerotic calcification of the aorta. Heart is enlarged. No pericardial effusion. There may be trace left pleural fluid. Hepatobiliary: Liver and gallbladder are unremarkable. No biliary ductal dilatation. Pancreas: Negative. Spleen: Negative. Adrenals/Urinary Tract: Adrenal glands are unremarkable. Subcentimeter low-attenuation lesions in the right kidney, too small to characterize. No specific follow-up necessary. Kidneys are otherwise unremarkable. Ureters are decompressed. Bladder is grossly unremarkable. Stomach/Bowel: Hiatal hernia repair. Stomach and small bowel are unremarkable. Appendix is not readily identified. Moderate stool burden. Patient is status post rectopexy. Posterior perirectal hyperattenuating fluid collection measures 5.0 x 8.3 cm and exerts mass effect on the rectum. Additional edema and fluid in the sigmoid mesocolon. Vascular/Lymphatic: Atherosclerotic calcification of the aorta. No pathologically enlarged lymph nodes. Reproductive: Uterus is visualized.  No adnexal mass. Other: Small bilateral inguinal hernias contain fat. Tiny umbilical hernia contains fat. No free fluid. Musculoskeletal: Degenerative changes in the spine. Grade 1 anterolisthesis of L4 on L5. Old L2 compression fracture. Old upper sacral fracture. Mild anterior wedging of T11, old. IMPRESSION: 1. Posterior perirectal hemorrhage status post rectopexy 02/24/2023, with mass effect on the rectum. Additional associated edema and fluid in the sigmoid  mesocolon. 2. Trace left pleural fluid. 3.  Aortic atherosclerosis (ICD10-I70.0). Electronically Signed   By: Leanna Battles M.D.   On: 03/06/2023 19:17   DG Chest Port 1 View Result Date: 03/06/2023 CLINICAL DATA:  Shortness of breath, dyspnea.  Wheezing. EXAM: PORTABLE CHEST 1 VIEW COMPARISON:  07/15/2022 and chest CT from 11/09/2022 FINDINGS: Atherosclerotic calcification of the aortic arch. Mild enlargement of the cardiopericardial silhouette without edema. Airway  thickening is present, suggesting bronchitis or reactive airways disease. Degenerative glenohumeral arthropathy bilaterally. No blunting of the costophrenic angles. IMPRESSION: 1. Airway thickening is present, suggesting bronchitis or reactive airways disease. 2. Mild enlargement of the cardiopericardial silhouette without edema. 3. Aortic Atherosclerosis (ICD10-I70.0). Electronically Signed   By: Gaylyn Rong M.D.   On: 03/06/2023 17:02    Procedures .Critical Care  Performed by: Eber Hong, MD Authorized by: Eber Hong, MD   Critical care provider statement:    Critical care time (minutes):  45   Critical care time was exclusive of:  Separately billable procedures and treating other patients and teaching time   Critical care was necessary to treat or prevent imminent or life-threatening deterioration of the following conditions:  Respiratory failure   Critical care was time spent personally by me on the following activities:  Development of treatment plan with patient or surrogate, discussions with consultants, evaluation of patient's response to treatment, examination of patient, obtaining history from patient or surrogate, review of old charts, re-evaluation of patient's condition, pulse oximetry, ordering and review of radiographic studies, ordering and review of laboratory studies and ordering and performing treatments and interventions   I assumed direction of critical care for this patient from another provider in my  specialty: no     Care discussed with: admitting provider   Comments:           Medications Ordered in ED Medications  lactated ringers infusion ( Intravenous New Bag/Given 03/06/23 1804)  LORazepam (ATIVAN) injection 1 mg (has no administration in time range)  vancomycin (VANCOCIN) IVPB 1000 mg/200 mL premix (1,000 mg Intravenous New Bag/Given 03/06/23 1729)  ceFEPIme (MAXIPIME) 2 g in sodium chloride 0.9 % 100 mL IVPB (0 g Intravenous Stopped 03/06/23 1807)  iohexol (OMNIPAQUE) 300 MG/ML solution 100 mL (100 mLs Intravenous Contrast Given 03/06/23 1848)    ED Course/ Medical Decision Making/ A&P                                 Medical Decision Making Amount and/or Complexity of Data Reviewed Labs: ordered. Radiology: ordered.  Risk Prescription drug management. Decision regarding hospitalization.    This patient presents to the ED for concern of altered mental status, this involves an extensive number of treatment options, and is a complaint that carries with it a high risk of complications and morbidity.  The differential diagnosis includes sepsis, stroke, pneumonia, hypoxic respiratory failure, hypercapnia, surgical complications   Co morbidities that complicate the patient evaluation  Chronic lung disease   Additional history obtained:  Additional history obtained from recent surgery External records from outside source obtained and reviewed including hospitalization notes   Lab Tests:  I Ordered, and personally interpreted labs.  The pertinent results include: COVID-19 positive negative for flu, ABG appears okay on oxygen, lactate of 1.4, metabolic panel is unremarkable and the CBC is without a leukocytosis   Imaging Studies ordered:  I ordered imaging studies including x-ray showing airway thickening, CT scan was also obtained I independently visualized and interpreted imaging which showed some rectosigmoid posterior hemorrhage postoperatively I agree with the  radiologist interpretation   Cardiac Monitoring: / EKG:  The patient was maintained on a cardiac monitor.  I personally viewed and interpreted the cardiac monitored which showed an underlying rhythm of: Normal sinus rhythm   Consultations Obtained:  I requested consultation with the hospitalist Dr. Mariea Clonts as well as general surgery Dr. [name],  and discussed lab and imaging findings as well as pertinent plan - they recommend: At treatment for hypoxic respiratory failure second COVID-19   Problem List / ED Course / Critical interventions / Medication management  The patient is critically ill with hypoxic respiratory failure requiring oxygen, antibiotics were given for sepsis although it does appear to be COVID-19 I ordered medication including antibiotics and oxygen for respiratory failure Reevaluation of the patient after these medicines showed that the patient critically ill I have reviewed the patients home medicines and have made adjustments as needed   Social Determinants of Health:  Critically ill   Test / Admission - Considered:  Needs higher level of care this evening         Final Clinical Impression(s) / ED Diagnoses Final diagnoses:  Acute respiratory failure with hypoxia (HCC)  Postoperative hemorrhage involving digestive system following digestive system procedure  COVID-19    Rx / DC Orders ED Discharge Orders     None         Eber Hong, MD 03/06/23 [phone]

## 2023-03-06 NOTE — ED Notes (Signed)
Pt resting quietly at this time in NAD.

## 2023-03-06 NOTE — ED Triage Notes (Signed)
Pt arrived via [facility] from home  c/o shob, difficulty breathing, and AMS, audible wheezes, also per EMS, pt stopped breathing once at one point en route, placed on 2 L via [place] and SpO2 is 100%

## 2023-03-06 NOTE — Assessment & Plan Note (Signed)
Patient noncompliant with psych meds. -Resume Prozac, Wellbutrin and LAMA treatment.

## 2023-03-06 NOTE — Assessment & Plan Note (Addendum)
On my evaluation, patient is sedated status post Ativan, but she awakens to voice but speech is incomprehensible.  Initial AMS likely secondary to-COVID infection, hypoxia.  Patient's son reported patient has had several falls recently, combative with caregivers, on narcotic pain medications, and takes excessive amounts of gabapentin, noncompliant with psych meds.  ABG with normal pH and pCO2. -Obtain head CT -Ativan 0.5Q4 hourly as needed for agitation -PT eval when able, TOC consult, patient will need placement - Wellbutrin, Prozac, lamotrigine on home med list, awaiting med rec -LR 150 cc/h started in ED, will continue with N/s 100cc/hr x 15hrs -Follow-up UA -Follow blood cultures ordered in ED -Vanco and cefepime started in ED, hold off on broad-spectrum antibiotics at this time

## 2023-03-06 NOTE — Assessment & Plan Note (Addendum)
O2 sats down to 87% on room air, on 2 L. Likely COVID virus bronchitis as etiology for hypoxia.  Chest x-ray suggesting bronchitis or reactive airway disease.  CT shows trace pleural fluid.  COVID-positive.  History of asthma.  Lung exam unremarkable. -ABG pH of 7.39, pCO2 of 46. -Hold off on further imaging for now, patient just received contrast load for CT abdomen

## 2023-03-06 NOTE — ED Notes (Signed)
Placed pt in mittens. She is extremely agitated, and combative. She is yelling at the staff and generally just yelling out at anyone that comes into the room.

## 2023-03-06 NOTE — ED Notes (Signed)
Family requests social services visit to assist with SNF placement. They do not believe she can safely return to independent living.

## 2023-03-06 NOTE — Progress Notes (Signed)
CODE SEPSIS - PHARMACY COMMUNICATION  **Broad Spectrum Antibiotics should be administered within 1 hour of Sepsis diagnosis**  Time Code Sepsis Called/Page Received: 1634  Antibiotics Ordered: cefepime, vancomycin  Time of 1st antibiotic administration: 1728  Additional action taken by pharmacy: Messaged nurse  If necessary, Name of Provider/Nurse Contacted: Haze Justin, RN    Merryl Hacker ,PharmD Clinical Pharmacist  03/06/2023  4:38 PM

## 2023-03-06 NOTE — Assessment & Plan Note (Addendum)
Presenting with hypoxia, cough, dyspnea.  Chest x-ray suggestive of bronchitis or reactive airway disease.  At this time no suggestion of thromboembolic etiology, patient also just received contrast for CT abdomen, deferred further chest imaging. - With hypoxia will start Solu-Medrol-60 mg x 1, continue with weight based dosing - Paxlovid -Obtain and trend inflammatory markers -Pending clinical course, consider further imaging

## 2023-03-06 NOTE — Assessment & Plan Note (Addendum)
Recent hospitalization 1/22 to 1/26 for rectal prolapse, patient had XI robotic assisted rectopexy by Dr. [name], Dr. [name]. -CT abdomen and pelvis with contrast today- Posterior perirectal hemorrhage status post rectopexy 02/24/2023, with mass effect on the rectum. Additional associated edema and fluid in the sigmoid mesocolon - EDP to Dr. [name] with general surgery,-okay to admit here, monitor, small enough that hemorrhage will resolve, no need for transfer. -Trend hemoglobin

## 2023-03-06 NOTE — Sepsis Progress Note (Signed)
 Elink following code sepsis

## 2023-03-06 NOTE — Assessment & Plan Note (Addendum)
Lung exam unremarkable. -Albuterol nebs as needed' -Resume home regimen

## 2023-03-06 NOTE — ED Notes (Signed)
Tried to call report to the floor, nurse asked if she could call me back in five minutes.

## 2023-03-07 ENCOUNTER — Encounter (HOSPITAL_COMMUNITY): Payer: Self-pay | Admitting: Internal Medicine

## 2023-03-07 ENCOUNTER — Inpatient Hospital Stay (HOSPITAL_COMMUNITY): Payer: Medicare Other

## 2023-03-07 ENCOUNTER — Other Ambulatory Visit: Payer: Self-pay

## 2023-03-07 DIAGNOSIS — G9341 Metabolic encephalopathy: Secondary | ICD-10-CM | POA: Diagnosis not present

## 2023-03-07 DIAGNOSIS — J9601 Acute respiratory failure with hypoxia: Secondary | ICD-10-CM | POA: Diagnosis not present

## 2023-03-07 DIAGNOSIS — U071 COVID-19: Secondary | ICD-10-CM | POA: Diagnosis not present

## 2023-03-07 LAB — FERRITIN: Ferritin: 79 ng/mL (ref 11–307)

## 2023-03-07 LAB — CBC
HCT: 31.3 % — ABNORMAL LOW (ref 36.0–46.0)
Hemoglobin: 10.1 g/dL — ABNORMAL LOW (ref 12.0–15.0)
MCH: 32.3 pg (ref 26.0–34.0)
MCHC: 32.3 g/dL (ref 30.0–36.0)
MCV: 100 fL (ref 80.0–100.0)
Platelets: 290 10*3/uL (ref 150–400)
RBC: 3.13 MIL/uL — ABNORMAL LOW (ref 3.87–5.11)
RDW: 14 % (ref 11.5–15.5)
WBC: 3.6 10*3/uL — ABNORMAL LOW (ref 4.0–10.5)
nRBC: 0 % (ref 0.0–0.2)

## 2023-03-07 LAB — BASIC METABOLIC PANEL
Anion gap: 11 (ref 5–15)
BUN: 17 mg/dL (ref 8–23)
CO2: 26 mmol/L (ref 22–32)
Calcium: 8.8 mg/dL — ABNORMAL LOW (ref 8.9–10.3)
Chloride: 100 mmol/L (ref 98–111)
Creatinine, Ser: 0.57 mg/dL (ref 0.44–1.00)
GFR, Estimated: >60 mL/min (ref >60)
Glucose, Bld: 138 mg/dL — ABNORMAL HIGH (ref 70–99)
Potassium: 3.7 mmol/L (ref 3.5–5.1)
Sodium: 137 mmol/L (ref 135–145)

## 2023-03-07 LAB — D-DIMER, QUANTITATIVE: D-Dimer, Quant: 1.34 ug{FEU}/mL — ABNORMAL HIGH (ref 0.00–0.50)

## 2023-03-07 LAB — C-REACTIVE PROTEIN: CRP: 2.4 mg/dL — ABNORMAL HIGH (ref <1.0)

## 2023-03-07 LAB — GLUCOSE, CAPILLARY: Glucose-Capillary: 140 mg/dL — ABNORMAL HIGH (ref 70–99)

## 2023-03-07 MED ORDER — BUPROPION HCL 75 MG PO TABS
75.0000 mg | ORAL_TABLET | Freq: Two times a day (BID) | ORAL | Status: DC
  Administered 2023-03-07 – 2023-03-11 (×9): 75 mg via ORAL
  Filled 2023-03-07 (×9): qty 1

## 2023-03-07 MED ORDER — HALOPERIDOL LACTATE 5 MG/ML IJ SOLN
2.0000 mg | Freq: Once | INTRAMUSCULAR | Status: AC
  Administered 2023-03-07: 2 mg via INTRAMUSCULAR
  Filled 2023-03-07: qty 1

## 2023-03-07 MED ORDER — HALOPERIDOL LACTATE 5 MG/ML IJ SOLN
5.0000 mg | Freq: Four times a day (QID) | INTRAMUSCULAR | Status: DC | PRN
  Administered 2023-03-07 – 2023-03-08 (×3): 5 mg via INTRAVENOUS
  Filled 2023-03-07 (×4): qty 1

## 2023-03-07 MED ORDER — LAMOTRIGINE 100 MG PO TABS
200.0000 mg | ORAL_TABLET | Freq: Two times a day (BID) | ORAL | Status: DC
  Administered 2023-03-07 – 2023-03-11 (×10): 200 mg via ORAL
  Filled 2023-03-07 (×10): qty 2

## 2023-03-07 NOTE — TOC Initial Note (Signed)
Transition of Care [facility]) - Initial/Assessment Note    Patient Details  Name: Heidi Fuentes MRN: [id number] Date of Birth: March 21, 1945  Transition of Care [facility]) CM/SW Contact:    Karn Cassis, LCSW Phone Number: 03/07/2023, 2:34 PM  Clinical Narrative: Pt admitted due to acute metabolic encephalopathy. COVID +. Assessment completed with pt's son, Homero Fellers as pt oriented to self only per chart. Homero Fellers reports pt has been living at home with around the clock sitters recently. She requires some assist with ADLs at baseline. Family report worsening confusion and concern that pt is abusing pain medication. She d/c home from SNF in October and was doing well at that time. She has had home health in the past, but not currently active per son. Discussed d/c plan and Homero Fellers reports concern about pt returning home, but said he and his brother won't make decisions for pt because she has pushed them away and told them not to. He indicates pt has been in similar situations in the past and has always cleared. They have talked to an attorney, the clerk of court, and DSS and all have told them at that time that they could not pursue guardianship because when she clears, pt is able to make decisions.Pt has been agitated and combative this admission. Discussed with MD and will likely need to involve DSS tomorrow for possible interim guardianship. Son indicates due to special circumstances with his and his wife's positions with the county, the case will need to be transferred to another county. TOC will discuss with MD again tomorrow and if no change will make report at that time.                    Expected Discharge Plan:  (unsure at this time) Barriers to Discharge: Continued Medical Work up   Patient Goals and CMS Choice Patient states their goals for this hospitalization and ongoing recovery are:: unsure at this time   Choice offered to / list presented to : Adult Children [place] ownership interest  in [facility].provided to::  (n/a)    Expected Discharge Plan and Services In-house Referral: Clinical Social Work     Living arrangements for the past 2 months: Single Family Home                                      Prior Living Arrangements/Services Living arrangements for the past 2 months: Single Family Home Lives with:: Other (Comment) (sitters) Patient language and need for interpreter reviewed:: Yes Do you feel safe going back to the place where you live?:  (Pt will likely need placement)      Need for Family Participation in Patient Care: Yes (Comment)   Current home services: DME (walker, cane) Criminal Activity/Legal Involvement Pertinent to Current Situation/Hospitalization: No - Comment as needed  Activities of Daily Living   ADL Screening (condition at time of admission) Independently performs ADLs?: Yes (appropriate for developmental age) Is the patient deaf or have difficulty hearing?: Yes Does the patient have difficulty seeing, even when wearing glasses/contacts?: No Does the patient have difficulty concentrating, remembering, or making decisions?: Yes  Permission Sought/Granted                  Emotional Assessment   Attitude/Demeanor/Rapport: Unable to Assess Affect (typically observed): Unable to Assess Orientation: : Oriented to Self Alcohol / Substance Use: Not Applicable  Admission diagnosis:  Acute respiratory failure with hypoxia (HCC) [J96.01] Postoperative hemorrhage involving digestive system following digestive system procedure [K91.840] Acute metabolic encephalopathy [G93.41] COVID-19 [U07.1] Patient Active Problem List   Diagnosis Date Noted   Acute metabolic encephalopathy 03/06/2023   COVID-19 virus infection 03/06/2023   Acute hypoxic respiratory failure (HCC) 03/06/2023   Rectal prolapse 02/24/2023   Sacral fracture, closed (HCC) 11/20/2022   Lumbar vertebral fracture (HCC) 11/20/2022   Compression  fracture of pelvis (HCC) 11/10/2022   Aspiration pneumonia (HCC) 11/09/2022   Agitation 07/27/2022   Mixed bipolar I disorder (HCC) 07/22/2022   Delirium 07/22/2022   Depression, major, recurrent, mild (HCC) 07/22/2022   Severe sepsis (HCC) 07/15/2022   UTI (urinary tract infection) 07/15/2022   Encephalopathy, metabolic 07/15/2022   History of adenomatous polyp of colon 05/12/2021   Bipolar affective disorder, depressed, mild (HCC) 08/07/2020   Rhabdomyolysis 08/03/2020   Elevated LFTs 08/03/2020   Unilateral primary osteoarthritis, left hip 03/26/2020   Pedal edema 03/18/2020   Pain in joint involving ankle and foot 12/20/2019   Bronchiectasis without complication (HCC) 10/31/2019   DOE (dyspnea on exertion) 07/27/2019   Dysphagia 07/25/2018   Leukocytosis 07/25/2018   Closed fracture of left orbital floor (HCC) 07/25/2018   Exudative age-related macular degeneration, bilateral, with active choroidal neovascularization (HCC) 07/25/2018   CAP (community acquired pneumonia) due to MRSA (methicillin resistant Staphylococcus aureus) (HCC) 02/13/2018   Asthma, chronic, unspecified asthma severity, with acute exacerbation 02/04/2018   Cough 11/02/2017   Gastroesophageal reflux disease without esophagitis 10/30/2017   Other specified glaucoma 10/30/2017   Uncomplicated asthma 10/30/2017   Chronic rhinitis 10/30/2017   Long-term current use of opiate analgesic 03/10/2017   Essential hypertension 02/17/2017   On long term drug therapy 02/08/2017   Chronic narcotic use 02/04/2017   Hiatal hernia 02/03/2017   Hypokalemia 09/06/2015   Fall    Iron deficiency anemia, unspecified 12/15/2013   Schatzki's ring 08/17/2013   Anemia 08/17/2013   Encounter for screening colonoscopy 08/17/2013   Constipation 07/10/2013   Chronic pain syndrome 07/10/2013   Acquired hypothyroidism 07/09/2013   Colitis 07/09/2013   Asthma, chronic 07/09/2013   PCP:  [name], DO Pharmacy:    [facility] - [place], [place] - [address] [place] [address] Phone: [phone] Fax: [phone]  [facility] - [place], [place] - [address] [place] [address] Phone: [phone] Fax: [phone]     Social Drivers of Health (SDOH) Social History: SDOH Screenings   Food Insecurity: Patient Unable To Answer (03/07/2023)  Housing: Patient Unable To Answer (03/07/2023)  Transportation Needs: Patient Unable To Answer (03/07/2023)  Utilities: Patient Unable To Answer (03/07/2023)  Social Connections: Patient Unable To Answer (03/07/2023)  Recent Concern: Social Connections - Socially Isolated (02/24/2023)  Tobacco Use: Low Risk  (02/24/2023)   SDOH Interventions:     Readmission Risk Interventions    03/07/2023    2:31 PM 11/10/2022   11:05 AM  Readmission Risk Prevention Plan  Transportation Screening Complete Complete  Medication Review (RN Care Manager) Complete Complete  PCP or Specialist appointment within 3-5 days of discharge  Not Complete  HRI or Home Care Consult Complete Complete  SW Recovery Care/Counseling Consult Complete Complete  Palliative Care Screening Not Applicable Not Applicable  Skilled Nursing Facility -- Not Complete

## 2023-03-07 NOTE — Hospital Course (Signed)
78 y.o. female with medical history significant for asthma, bipolar disorder, depression, hypertension, hypothyroidism, fibromyalgia.  Patient was brought to the ED reports of altered mental status, cough and difficulty breathing.  In the time of my evaluation, he is unable to answer questions, she is sedated, patient is status post 1 mg of Ativan, arouses to touch, speech is incomprehensible, and drifts back to sleep.  Per triage notes-EMS was called for above symptoms, they report patient stopped breathing at some point en route, per ED provider does not confirm this occurred, did not get this in signout from EMS. EMS was called out earlier today to patients home by patients nurse-  patient was awake and alert and refused transport at that time, but with change in mental status, patient was unable to talk or walk, EMS was called again-reports O2 sats of 86% with gurgling respiration.  She was placed on 2 L.   Recent hospitalization 1/22 to 1/26 for rectal prolapse, patient had XI robotic assisted rectopexy by Dr. [name], Dr. [name].   ED Course: Temperature 97.4.  Heart rate 66-113.  Respiratory rate mostly below 20, range- 15-41.  Blood pressure systolic 137-1 52.  O2 sats down to 87% on room air. WBC 9.1. COVID-positive. ABG with pH of 7.36, pCO2 of 46. Chest x-ray shows airway thickening, bronchitis or reactive airway disease. CT abdomen and pelvis with contrast-posterior perirectal hemorrhage status post rectopexy, with mass effect on the rectum.  Additional associated edema and fluid in the sigmoid mesocolon. EDP talked to Dr. [name] with general surgery,-okay to admit here, monitor, small enough that hemorrhage will resolve, no need for transfer.

## 2023-03-07 NOTE — Progress Notes (Signed)
Pt is confused and agitated has been all night. She has tried to get out of bed unassisted. She has been crying as well stating she does not want to be here. She wants to go home. She states we are lying and she does not have Covid. Have tried to orient patient without success. Pt has required several doses of ativan and a dose of haldol tonight. She get brief relief but then starts crying and yelling.  CT sent me a message earlier tonight asking for me to take patient down for CT. Adv I was the only RN on this floor and could not leave, asked CT if possible for her to come get pt. She is also the only CT tech. Will wait for am once transport staff arrives to take pt down for CT.

## 2023-03-07 NOTE — Progress Notes (Addendum)
PROGRESS NOTE   Heidi Fuentes  ZOX:[id number] DOB: 1945-02-20 DOA: 03/06/2023 PCP: [name], DO   Chief Complaint  Patient presents with   Shortness of Breath   Level of care: Telemetry  Brief Admission History:  78 y.o. female with medical history significant for asthma, bipolar disorder, depression, hypertension, hypothyroidism, fibromyalgia.  Patient was brought to the ED reports of altered mental status, cough and difficulty breathing.  In the time of my evaluation, he is unable to answer questions, she is sedated, patient is status post 1 mg of Ativan, arouses to touch, speech is incomprehensible, and drifts back to sleep.  Per triage notes-EMS was called for above symptoms, they report patient stopped breathing at some point en route, per ED provider does not confirm this occurred, did not get this in signout from EMS. EMS was called out earlier today to patients home by patients nurse-  patient was awake and alert and refused transport at that time, but with change in mental status, patient was unable to talk or walk, EMS was called again-reports O2 sats of 86% with gurgling respiration.  She was placed on 2 L.   Recent hospitalization 1/22 to 1/26 for rectal prolapse, patient had XI robotic assisted rectopexy by Dr. [name], Dr. [name].   ED Course: Temperature 97.4.  Heart rate 66-113.  Respiratory rate mostly below 20, range- 15-41.  Blood pressure systolic 137-1 52.  O2 sats down to 87% on room air. WBC 9.1. COVID-positive. ABG with pH of 7.36, pCO2 of 46. Chest x-ray shows airway thickening, bronchitis or reactive airway disease. CT abdomen and pelvis with contrast-posterior perirectal hemorrhage status post rectopexy, with mass effect on the rectum.  Additional associated edema and fluid in the sigmoid mesocolon. EDP talked to Dr. [name] with general surgery,-okay to admit here, monitor, small enough that hemorrhage will resolve, no need for transfer.   Assessment and  Plan:  Acute metabolic encephalopathy On my evaluation, patient is sedated status post Ativan, but she awakens to voice but speech is incomprehensible.  Initial AMS likely secondary to-COVID infection, hypoxia.  Patient's son reported patient has had several falls recently, combative with caregivers, on narcotic pain medications, and takes excessive amounts of gabapentin, noncompliant with psych meds.  ABG with normal pH and pCO2. -Obtain head CT:   No evidence of acute intracranial abnormality.  Moderate chronic small vessel ischemic disease -Ativan 0.5Q4 hourly as needed for agitation -PT eval when able, TOC consult, patient will need placement - Wellbutrin, Prozac, lamotrigine on home med list, awaiting home med reconciliation -continue IV fluid -Follow-up UA -Follow blood cultures ordered in ED -Vanco and cefepime started in ED, hold off on broad-spectrum antibiotics at this time  Acute hypoxic respiratory failure  O2 sats down to 87% on room air, on 2 L. Likely COVID virus bronchitis as etiology for hypoxia.  Chest x-ray suggesting bronchitis or reactive airway disease.  CT shows trace pleural fluid.  COVID-positive.  History of asthma.  Lung exam unremarkable. -ABG pH of 7.39, pCO2 of 46. -Hold off on further imaging for now, patient just received contrast load for CT abdomen  COVID-19 virus infection Presenting with hypoxia, cough, dyspnea.  Chest x-ray suggestive of bronchitis or reactive airway disease.  At this time no suggestion of thromboembolic etiology, patient also just received contrast for CT abdomen, deferred further chest imaging. - With hypoxia will start Solu-Medrol-60 mg x 1, continue with weight based dosing - Paxlovid ordered  -Obtain and trend inflammatory markers  Rectal prolapse Recent hospitalization 1/22 to 1/26 for rectal prolapse, patient had XI robotic assisted rectopexy by Dr. [name], Dr. [name]. -CT abdomen and pelvis with contrast on admission 03/06/23 -  Posterior perirectal hemorrhage status post rectopexy 02/24/2023, with mass effect on the rectum. Additional associated edema and fluid in the sigmoid mesocolon - EDP discussed with Dr. [name] with general surgery,-okay to admit here, monitor, small enough that hemorrhage will resolve, no need for transfer.  Bipolar affective disorder, depressed, mild  Patient noncompliant with psych meds. -Resume home meds when we can get them reconciled -we called sons and tried to call pharmacy and they are closed -from previous admission she was noted to be on lamictal 200 mg BID -we started that back today and hopefully can get home meds reconciled on 2/3.  Asthma, chronic Lung exam unremarkable. -Albuterol nebs as needed' -Resume home regimen  DVT prophylaxis: enoxaparin Code Status: Full  Family Communication:  Disposition: TBD  Consultants:   Procedures:   Antimicrobials:    Subjective: Pt was intermittently agitated most of the night, now sedated.   Objective: Vitals:   03/07/23 0500 03/07/23 0555 03/07/23 0756 03/07/23 0932  BP:  139/88  (!) 154/74  Pulse: 83 85    Resp: 15 (!) 21    Temp: (!) 97.5 F (36.4 C)   98 F (36.7 C)  TempSrc: Oral   Axillary  SpO2: 100% 100% 98%   Weight:      Height:        Intake/Output Summary (Last 24 hours) at 03/07/2023 1217 Last data filed at 03/07/2023 1200 Gross per 24 hour  Intake 589.38 ml  Output 900 ml  Net -310.62 ml   Filed Weights   03/06/23 2230  Weight: 52 kg   Examination:  General exam: Appears calm and comfortable, sedated.   Respiratory system: mild tachypnea. Cardiovascular system: normal S1 & S2 heard. No JVD, murmurs, rubs, gallops or clicks. No pedal edema. Gastrointestinal system: Abdomen is nondistended, soft and nontender. No organomegaly or masses felt. Normal bowel sounds heard. Central nervous system: somnolent. No focal neurological deficits. Extremities: Symmetric 5 x 5 power. Skin: No rashes, lesions  or ulcers. Psychiatry: Judgement and insight appear UTD. Mood & affect UTD.   Data Reviewed: I have personally reviewed following labs and imaging studies  CBC: Recent Labs  Lab 03/06/23 1641 03/07/23 0456  WBC 9.1 3.6*  NEUTROABS 4.0  --   HGB 10.3* 10.1*  HCT 33.6* 31.3*  MCV 102.4* 100.0  PLT 315 290    Basic Metabolic Panel: Recent Labs  Lab 03/06/23 1641 03/07/23 0456  NA 137 137  K 4.1 3.7  CL 100 100  CO2 26 26  GLUCOSE 83 138*  BUN 17 17  CREATININE 0.80 0.57  CALCIUM 9.1 8.8*    CBG: Recent Labs  Lab 03/07/23 0008  GLUCAP 140*    Recent Results (from the past 240 hours)  Blood Culture (routine x 2)     Status: None (Preliminary result)   Collection Time: 03/06/23  4:41 PM   Specimen: BLOOD  Result Value Ref Range Status   Specimen Description BLOOD BLOOD RIGHT ARM  Final   Special Requests NONE  Final   Culture   Final    NO GROWTH < 12 HOURS Performed at [facility], [address] [place], [place] [address]    Report Status PENDING  Incomplete  Blood Culture (routine x 2)     Status: None (Preliminary result)   Collection  Time: 03/06/23  4:41 PM   Specimen: BLOOD  Result Value Ref Range Status   Specimen Description BLOOD  Final   Special Requests NONE  Final   Culture   Final    NO GROWTH < 12 HOURS Performed at [facility], [address], [place], [place] [address]    Report Status PENDING  Incomplete  Resp panel by RT-PCR (RSV, Flu A&B, Covid) Anterior Nasal Swab     Status: Abnormal   Collection Time: 03/06/23  4:42 PM   Specimen: Anterior Nasal Swab  Result Value Ref Range Status   SARS Coronavirus 2 by RT PCR POSITIVE (A) NEGATIVE Final    Comment: (NOTE) SARS-CoV-2 target nucleic acids are DETECTED.  The SARS-CoV-2 RNA is generally detectable in upper respiratory specimens during the acute phase of infection. Positive results are indicative of the presence of the identified virus, but do not rule out bacterial infection or  co-infection with other pathogens not detected by the test. Clinical correlation with patient history and other diagnostic information is necessary to determine patient infection status. The expected result is Negative.  Fact Sheet for Patients: BloggerCourse.com  Fact Sheet for Healthcare Providers: SeriousBroker.it  This test is not yet approved or cleared by the [place] FDA and  has been authorized for detection and/or diagnosis of SARS-CoV-2 by FDA under an Emergency Use Authorization (EUA).  This EUA will remain in effect (meaning this test can be used) for the duration of  the COVID-19 declaration under Section 564(b)(1) of the A ct, 21 U.S.C. section 360bbb-3(b)(1), unless the authorization is terminated or revoked sooner.     Influenza A by PCR NEGATIVE NEGATIVE Final   Influenza B by PCR NEGATIVE NEGATIVE Final    Comment: (NOTE) The Xpert Xpress SARS-CoV-2/FLU/RSV plus assay is intended as an aid in the diagnosis of influenza from Nasopharyngeal swab specimens and should not be used as a sole basis for treatment. Nasal washings and aspirates are unacceptable for Xpert Xpress SARS-CoV-2/FLU/RSV testing.  Fact Sheet for Patients: BloggerCourse.com  Fact Sheet for Healthcare Providers: SeriousBroker.it  This test is not yet approved or cleared by the [place] FDA and has been authorized for detection and/or diagnosis of SARS-CoV-2 by FDA under an Emergency Use Authorization (EUA). This EUA will remain in effect (meaning this test can be used) for the duration of the COVID-19 declaration under Section 564(b)(1) of the Act, 21 U.S.C. section 360bbb-3(b)(1), unless the authorization is terminated or revoked.     Resp Syncytial Virus by PCR NEGATIVE NEGATIVE Final    Comment: (NOTE) Fact Sheet for Patients: BloggerCourse.com  Fact  Sheet for Healthcare Providers: SeriousBroker.it  This test is not yet approved or cleared by the [place] FDA and has been authorized for detection and/or diagnosis of SARS-CoV-2 by FDA under an Emergency Use Authorization (EUA). This EUA will remain in effect (meaning this test can be used) for the duration of the COVID-19 declaration under Section 564(b)(1) of the Act, 21 U.S.C. section 360bbb-3(b)(1), unless the authorization is terminated or revoked.  Performed at [facility], [address] [place], [place] [address]      Radiology Studies: CT HEAD WO CONTRAST ( ) Result Date: 03/07/2023 CLINICAL DATA:  Altered mental status. Acute metabolic encephalopathy. Recent falls. EXAM: CT HEAD WITHOUT CONTRAST TECHNIQUE: Contiguous axial images were obtained from the base of the skull through the vertex without intravenous contrast. RADIATION DOSE REDUCTION: This exam was performed according to the departmental dose-optimization program which includes automated exposure  control, adjustment of the mA and/or kV according to patient size and/or use of iterative reconstruction technique. COMPARISON:  Head CT 11/09/2022 FINDINGS: Brain: There is no evidence of an acute infarct, intracranial hemorrhage, mass, midline shift, or extra-axial fluid collection. Patchy cerebral white matter hypodensities are unchanged and nonspecific but compatible with moderate chronic small vessel ischemic disease. Mild cerebral atrophy is unchanged. Mild mineralization is again seen in the basal ganglia bilaterally. Vascular: Calcified atherosclerosis at the skull base. No hyperdense vessel. Skull: No acute fracture or suspicious osseous lesion. Sinuses/Orbits: Scattered mild mucosal thickening in the paranasal sinuses. Small volume fluid in the sphenoid sinuses. No significant mastoid fluid. Bilateral cataract extraction. Other: None. IMPRESSION: 1. No evidence of acute intracranial abnormality.  2. Moderate chronic small vessel ischemic disease. Electronically Signed   By: Sebastian Ache M.D.   On: 03/07/2023 10:50   CT ABDOMEN PELVIS W CONTRAST Result Date: 03/06/2023 CLINICAL DATA:  Sepsis, recent bowel surgery. EXAM: CT ABDOMEN AND PELVIS WITH CONTRAST TECHNIQUE: Multidetector CT imaging of the abdomen and pelvis was performed using the standard protocol following bolus administration of intravenous contrast. RADIATION DOSE REDUCTION: This exam was performed according to the departmental dose-optimization program which includes automated exposure control, adjustment of the mA and/or kV according to patient size and/or use of iterative reconstruction technique. CONTRAST:  OMNIPAQUE IOHEXOL 300 MG/ML  SOLN COMPARISON:  Report of 11/09/2022 is reviewed. Images are not available at the time of dictation. FINDINGS: Lower chest: Dependent atelectasis bilaterally. Atherosclerotic calcification of the aorta. Heart is enlarged. No pericardial effusion. There may be trace left pleural fluid. Hepatobiliary: Liver and gallbladder are unremarkable. No biliary ductal dilatation. Pancreas: Negative. Spleen: Negative. Adrenals/Urinary Tract: Adrenal glands are unremarkable. Subcentimeter low-attenuation lesions in the right kidney, too small to characterize. No specific follow-up necessary. Kidneys are otherwise unremarkable. Ureters are decompressed. Bladder is grossly unremarkable. Stomach/Bowel: Hiatal hernia repair. Stomach and small bowel are unremarkable. Appendix is not readily identified. Moderate stool burden. Patient is status post rectopexy. Posterior perirectal hyperattenuating fluid collection measures 5.0 x 8.3 cm and exerts mass effect on the rectum. Additional edema and fluid in the sigmoid mesocolon. Vascular/Lymphatic: Atherosclerotic calcification of the aorta. No pathologically enlarged lymph nodes. Reproductive: Uterus is visualized.  No adnexal mass. Other: Small bilateral inguinal hernias  contain fat. Tiny umbilical hernia contains fat. No free fluid. Musculoskeletal: Degenerative changes in the spine. Grade 1 anterolisthesis of L4 on L5. Old L2 compression fracture. Old upper sacral fracture. Mild anterior wedging of T11, old. IMPRESSION: 1. Posterior perirectal hemorrhage status post rectopexy 02/24/2023, with mass effect on the rectum. Additional associated edema and fluid in the sigmoid mesocolon. 2. Trace left pleural fluid. 3.  Aortic atherosclerosis (ICD10-I70.0). Electronically Signed   By: Leanna Battles M.D.   On: 03/06/2023 19:17   DG Chest Port 1 View Result Date: 03/06/2023 CLINICAL DATA:  Shortness of breath, dyspnea.  Wheezing. EXAM: PORTABLE CHEST 1 VIEW COMPARISON:  07/15/2022 and chest CT from 11/09/2022 FINDINGS: Atherosclerotic calcification of the aortic arch. Mild enlargement of the cardiopericardial silhouette without edema. Airway thickening is present, suggesting bronchitis or reactive airways disease. Degenerative glenohumeral arthropathy bilaterally. No blunting of the costophrenic angles. IMPRESSION: 1. Airway thickening is present, suggesting bronchitis or reactive airways disease. 2. Mild enlargement of the cardiopericardial silhouette without edema. 3. Aortic Atherosclerosis (ICD10-I70.0). Electronically Signed   By: Gaylyn Rong M.D.   On: 03/06/2023 17:02    Scheduled Meds:  enoxaparin (LOVENOX) injection  40 mg Subcutaneous Q24H  levothyroxine  25 mcg Oral Q0600   methylPREDNISolone (SOLU-MEDROL) injection  0.5 mg/kg Intravenous Q12H   Followed by   Melene Muller ON 03/10/2023] predniSONE  50 mg Oral Daily   metoprolol tartrate  12.5 mg Oral BID   mometasone-formoterol  2 puff Inhalation BID   nirmatrelvir/ritonavir  3 tablet Oral BID   Continuous Infusions:  sodium chloride 100 mL/hr at 03/07/23 0016     LOS: 1 day   Time spent: 57 mins  Christna Kulick Laural Benes, MD How to contact the [facility] Attending or Consulting provider 7A - 7P or covering provider  during after hours 7P -7A, for this patient?  Check the care team in [facility] and look for a) attending/consulting TRH provider listed and b) the [facility] team listed Log into [email or web address] to find provider on call.  Locate the [facility] provider you are looking for under [facility] and page to a number that you can be directly reached. If you still have difficulty reaching the provider, please page the [facility] (Director on Call) for the Hospitalists listed on amion for assistance.  03/07/2023, 12:17 PM

## 2023-03-08 DIAGNOSIS — U071 COVID-19: Secondary | ICD-10-CM | POA: Diagnosis not present

## 2023-03-08 DIAGNOSIS — J9601 Acute respiratory failure with hypoxia: Secondary | ICD-10-CM | POA: Diagnosis not present

## 2023-03-08 DIAGNOSIS — G9341 Metabolic encephalopathy: Secondary | ICD-10-CM | POA: Diagnosis not present

## 2023-03-08 LAB — CBC
HCT: 32.6 % — ABNORMAL LOW (ref 36.0–46.0)
Hemoglobin: 10.4 g/dL — ABNORMAL LOW (ref 12.0–15.0)
MCH: 31.1 pg (ref 26.0–34.0)
MCHC: 31.9 g/dL (ref 30.0–36.0)
MCV: 97.6 fL (ref 80.0–100.0)
Platelets: 337 10*3/uL (ref 150–400)
RBC: 3.34 MIL/uL — ABNORMAL LOW (ref 3.87–5.11)
RDW: 13.7 % (ref 11.5–15.5)
WBC: 7.2 10*3/uL (ref 4.0–10.5)
nRBC: 0 % (ref 0.0–0.2)

## 2023-03-08 LAB — COMPREHENSIVE METABOLIC PANEL
ALT: 20 U/L (ref 0–44)
AST: 27 U/L (ref 15–41)
Albumin: 3.4 g/dL — ABNORMAL LOW (ref 3.5–5.0)
Alkaline Phosphatase: 61 U/L (ref 38–126)
Anion gap: 13 (ref 5–15)
BUN: 19 mg/dL (ref 8–23)
CO2: 23 mmol/L (ref 22–32)
Calcium: 9 mg/dL (ref 8.9–10.3)
Chloride: 103 mmol/L (ref 98–111)
Creatinine, Ser: 0.63 mg/dL (ref 0.44–1.00)
GFR, Estimated: >60 mL/min (ref >60)
Glucose, Bld: 136 mg/dL — ABNORMAL HIGH (ref 70–99)
Potassium: 3.2 mmol/L — ABNORMAL LOW (ref 3.5–5.1)
Sodium: 139 mmol/L (ref 135–145)
Total Bilirubin: 0.8 mg/dL (ref 0.0–1.2)
Total Protein: 6.5 g/dL (ref 6.5–8.1)

## 2023-03-08 LAB — URINALYSIS, W/ REFLEX TO CULTURE (INFECTION SUSPECTED)
Bacteria, UA: NONE SEEN
Bilirubin Urine: NEGATIVE
Glucose, UA: NEGATIVE mg/dL
Hgb urine dipstick: NEGATIVE
Ketones, ur: 5 mg/dL — AB
Leukocytes,Ua: NEGATIVE
Nitrite: NEGATIVE
Protein, ur: 30 mg/dL — AB
Specific Gravity, Urine: 1.023 (ref 1.005–1.030)
pH: 6 (ref 5.0–8.0)

## 2023-03-08 LAB — FERRITIN: Ferritin: 85 ng/mL (ref 11–307)

## 2023-03-08 LAB — MAGNESIUM: Magnesium: 2.4 mg/dL (ref 1.7–2.4)

## 2023-03-08 LAB — C-REACTIVE PROTEIN: CRP: 1.2 mg/dL — ABNORMAL HIGH (ref <1.0)

## 2023-03-08 LAB — D-DIMER, QUANTITATIVE: D-Dimer, Quant: 1.11 ug{FEU}/mL — ABNORMAL HIGH (ref 0.00–0.50)

## 2023-03-08 MED ORDER — POTASSIUM CHLORIDE CRYS ER 20 MEQ PO TBCR
40.0000 meq | EXTENDED_RELEASE_TABLET | Freq: Once | ORAL | Status: AC
  Administered 2023-03-08: 40 meq via ORAL
  Filled 2023-03-08: qty 2

## 2023-03-08 MED ORDER — NIRMATRELVIR/RITONAVIR (PAXLOVID) TABLET (RENAL DOSING)
2.0000 | ORAL_TABLET | Freq: Two times a day (BID) | ORAL | Status: DC
  Administered 2023-03-08 – 2023-03-11 (×7): 2 via ORAL
  Filled 2023-03-08: qty 20

## 2023-03-08 NOTE — Plan of Care (Signed)
  Problem: Education: Goal: Knowledge of risk factors and measures for prevention of condition will improve Outcome: Not Progressing   Problem: Coping: Goal: Psychosocial and spiritual needs will be supported Outcome: Not Progressing   Problem: Coping: Goal: Level of anxiety will decrease Outcome: Not Progressing

## 2023-03-08 NOTE — Plan of Care (Signed)

## 2023-03-08 NOTE — TOC Progression Note (Addendum)
Transition of Care [facility]) - Progression Note    Patient Details  Name: Heidi Fuentes MRN: [id number] Date of Birth: Nov 27, 1945  Transition of Care [facility]) CM/SW Contact  Karn Cassis, [place] Phone Number: 03/08/2023, 11:06 AM  Clinical Narrative:  LCSW made APS report to Darliss Cheney with [facility] DSS to start interim guardianship process. He reports he will staff with his supervisor and notify this LCSW if they will transfer case to another county. TOC will follow.    Update: Case has been transferred to [facility] and assigned to [facility] [phone], [phone]). Janine to assess pt in AM.   Expected Discharge Plan:  (unsure at this time) Barriers to Discharge: Continued Medical Work up  Expected Discharge Plan and Services In-house Referral: Clinical Social Work     Living arrangements for the past 2 months: Single Family Home                                       Social Determinants of Health (SDOH) Interventions SDOH Screenings   Food Insecurity: Patient Unable To Answer (03/07/2023)  Housing: Patient Unable To Answer (03/07/2023)  Transportation Needs: Patient Unable To Answer (03/07/2023)  Utilities: Patient Unable To Answer (03/07/2023)  Social Connections: Patient Unable To Answer (03/07/2023)  Recent Concern: Social Connections - Socially Isolated (02/24/2023)  Tobacco Use: Low Risk  (02/24/2023)    Readmission Risk Interventions    03/07/2023    2:31 PM 11/10/2022   11:05 AM  Readmission Risk Prevention Plan  Transportation Screening Complete Complete  Medication Review [employer]) Complete Complete  PCP or Specialist appointment within 3-5 days of discharge  Not Complete  HRI or Home Care Consult Complete Complete  SW Recovery Care/Counseling Consult Complete Complete  Palliative Care Screening Not Applicable Not Applicable  Skilled Nursing Facility -- Not Complete

## 2023-03-08 NOTE — Progress Notes (Addendum)
PROGRESS NOTE   Heidi Fuentes  ZOX:[id number] DOB: 29-Dec-1945 DOA: 03/06/2023 PCP: [name], DO   Chief Complaint  Patient presents with   Shortness of Breath   Level of care: Telemetry  Brief Admission History:  78 y.o. female with medical history significant for asthma, bipolar disorder, depression, hypertension, hypothyroidism, fibromyalgia.  Patient was brought to the ED reports of altered mental status, cough and difficulty breathing.  In the time of my evaluation, he is unable to answer questions, she is sedated, patient is status post 1 mg of Ativan, arouses to touch, speech is incomprehensible, and drifts back to sleep.  Per triage notes-EMS was called for above symptoms, they report patient stopped breathing at some point en route, per ED provider does not confirm this occurred, did not get this in signout from EMS. EMS was called out earlier today to patients home by patients nurse-  patient was awake and alert and refused transport at that time, but with change in mental status, patient was unable to talk or walk, EMS was called again-reports O2 sats of 86% with gurgling respiration.  She was placed on 2 L.   Recent hospitalization 1/22 to 1/26 for rectal prolapse, patient had XI robotic assisted rectopexy by Dr. [name], Dr. [name].   ED Course: Temperature 97.4.  Heart rate 66-113.  Respiratory rate mostly below 20, range- 15-41.  Blood pressure systolic 137-1 52.  O2 sats down to 87% on room air. WBC 9.1. COVID-positive. ABG with pH of 7.36, pCO2 of 46. Chest x-ray shows airway thickening, bronchitis or reactive airway disease. CT abdomen and pelvis with contrast-posterior perirectal hemorrhage status post rectopexy, with mass effect on the rectum.  Additional associated edema and fluid in the sigmoid mesocolon. EDP talked to Dr. [name] with general surgery,-okay to admit here, monitor, small enough that hemorrhage will resolve, no need for transfer.   Assessment and  Plan:  Acute metabolic encephalopathy On my evaluation, patient is sedated status post Ativan, but she awakens to voice but speech is incomprehensible.  Initial AMS likely secondary to-COVID infection, hypoxia.  Patient's son reported patient has had several falls recently, combative with caregivers, on narcotic pain medications, and takes excessive amounts of gabapentin, noncompliant with psych meds.  ABG with normal pH and pCO2. -Obtain head CT:   No evidence of acute intracranial abnormality.  Moderate chronic small vessel ischemic disease -Ativan as needed for agitation -PT eval when able, TOC consult, patient likely will need placement - Wellbutrin, Prozac, lamotrigine on home med list, hold prozac for now while on paxlovid per pharm D, ok to resume lamotrigine and bupropion -Follow-up UA-- no signs of infection seen -Follow blood cultures- no growth to date   Acute hypoxic respiratory failure  O2 sats down to 87% on room air, on 2 L. Likely COVID virus bronchitis as etiology for hypoxia.  Chest x-ray suggesting bronchitis or reactive airway disease.  CT shows trace pleural fluid.  COVID-positive.  -ABG pH of 7.39, pCO2 of 46. -Hold off on further imaging for now, patient just received contrast load for CT abdomen -continue bronchodilators, supplemental oxygen and respiratory support   COVID-19 virus infection Presenting with hypoxia, cough, dyspnea.  Chest x-ray suggestive of bronchitis or reactive airway disease.  At this time no suggestion of thromboembolic etiology, patient also just received contrast for CT abdomen, deferred further chest imaging. - With hypoxia will start Solu-Medrol-60 mg x 1, continue with weight based dosing - Paxlovid ordered  -Obtain and trend inflammatory  markers  Rectal prolapse Recent hospitalization 1/22 to 1/26 for rectal prolapse, patient had XI robotic assisted rectopexy by Dr. [name], Dr. [name]. -CT abdomen and pelvis with contrast on admission 03/06/23  - Posterior perirectal hemorrhage status post rectopexy 02/24/2023, with mass effect on the rectum. Additional associated edema and fluid in the sigmoid mesocolon - EDP discussed with Dr. [name] with general surgery,-okay to admit here, monitor, small enough that hemorrhage will resolve, no need for transfer.  Hypokalemia - oral replacement ordered, recheck Mg   Bipolar affective disorder, depressed, mild  Patient noncompliant with psych meds per multiple reports -Resumed home lamotrigine and bupropion.   Holding fluoxetine per pharm D while on paxlovid -we've received reports that patient unable to care for self at home and she currently does NOT have decisional capacity at this time.  TOC working on APS referral to petition guardianship.    Asthma, chronic Lung exam unremarkable. -Albuterol nebs as needed -Resume home regimen  DVT prophylaxis: enoxaparin Code Status: Full  Family Communication:  Disposition: TBD  Consultants:   Procedures:   Antimicrobials:    Subjective: Pt remains confused and required safety sitter; she is saying she is not short of breath today.   Objective: Vitals:   03/07/23 1900 03/07/23 2124 03/07/23 2150 03/08/23 0300  BP:   (!) 156/73   Pulse:   89   Resp: 12   20  Temp: 98.1 F (36.7 C)   98.2 F (36.8 C)  TempSrc: Axillary   Axillary  SpO2: 99% 99%    Weight:      Height:        Intake/Output Summary (Last 24 hours) at 03/08/2023 1220 Last data filed at 03/08/2023 0649 Gross per 24 hour  Intake 1439.59 ml  Output --  Net 1439.59 ml   Filed Weights   03/06/23 2230  Weight: 52 kg   Examination:  General exam: Appears calm and comfortable, sedated.   Respiratory system: mild tachypnea. Cardiovascular system: normal S1 & S2 heard. No JVD, murmurs, rubs, gallops or clicks. No pedal edema. Gastrointestinal system: Abdomen is nondistended, soft and nontender. No organomegaly or masses felt. Normal bowel sounds heard. Central nervous  system: somnolent. No focal neurological deficits. Extremities: Symmetric 5 x 5 power. Skin: No rashes, lesions or ulcers. Psychiatry: Judgement and insight appear UTD. Mood & affect UTD.   Data Reviewed: I have personally reviewed following labs and imaging studies  CBC: Recent Labs  Lab 03/06/23 1641 03/07/23 0456 03/08/23 0538  WBC 9.1 3.6* 7.2  NEUTROABS 4.0  --   --   HGB 10.3* 10.1* 10.4*  HCT 33.6* 31.3* 32.6*  MCV 102.4* 100.0 97.6  PLT 315 290 337    Basic Metabolic Panel: Recent Labs  Lab 03/06/23 1641 03/07/23 0456 03/08/23 0538  NA 137 137 139  K 4.1 3.7 3.2*  CL 100 100 103  CO2 26 26 23   GLUCOSE 83 138* 136*  BUN 17 17 19   CREATININE 0.80 0.57 0.63  CALCIUM 9.1 8.8* 9.0  MG  --   --  2.4    CBG: Recent Labs  Lab 03/07/23 0008  GLUCAP 140*    Recent Results (from the past 240 hours)  Blood Culture (routine x 2)     Status: None (Preliminary result)   Collection Time: 03/06/23  4:41 PM   Specimen: BLOOD  Result Value Ref Range Status   Specimen Description BLOOD BLOOD RIGHT ARM  Final   Special Requests NONE  Final  Culture   Final    NO GROWTH < 12 HOURS Performed at [facility], [address] [place], [place] [address]    Report Status PENDING  Incomplete  Blood Culture (routine x 2)     Status: None (Preliminary result)   Collection Time: 03/06/23  4:41 PM   Specimen: BLOOD  Result Value Ref Range Status   Specimen Description BLOOD  Final   Special Requests NONE  Final   Culture   Final    NO GROWTH < 12 HOURS Performed at [facility], [address] [place], [place] [address]    Report Status PENDING  Incomplete  Resp panel by RT-PCR (RSV, Flu A&B, Covid) Anterior Nasal Swab     Status: Abnormal   Collection Time: 03/06/23  4:42 PM   Specimen: Anterior Nasal Swab  Result Value Ref Range Status   SARS Coronavirus 2 by RT PCR POSITIVE (A) NEGATIVE Final    Comment: (NOTE) SARS-CoV-2 target nucleic acids are DETECTED.  The  SARS-CoV-2 RNA is generally detectable in upper respiratory specimens during the acute phase of infection. Positive results are indicative of the presence of the identified virus, but do not rule out bacterial infection or co-infection with other pathogens not detected by the test. Clinical correlation with patient history and other diagnostic information is necessary to determine patient infection status. The expected result is Negative.  Fact Sheet for Patients: BloggerCourse.com  Fact Sheet for Healthcare Providers: SeriousBroker.it  This test is not yet approved or cleared by the [place] FDA and  has been authorized for detection and/or diagnosis of SARS-CoV-2 by FDA under an Emergency Use Authorization (EUA).  This EUA will remain in effect (meaning this test can be used) for the duration of  the COVID-19 declaration under Section 564(b)(1) of the A ct, 21 U.S.C. section 360bbb-3(b)(1), unless the authorization is terminated or revoked sooner.     Influenza A by PCR NEGATIVE NEGATIVE Final   Influenza B by PCR NEGATIVE NEGATIVE Final    Comment: (NOTE) The Xpert Xpress SARS-CoV-2/FLU/RSV plus assay is intended as an aid in the diagnosis of influenza from Nasopharyngeal swab specimens and should not be used as a sole basis for treatment. Nasal washings and aspirates are unacceptable for Xpert Xpress SARS-CoV-2/FLU/RSV testing.  Fact Sheet for Patients: BloggerCourse.com  Fact Sheet for Healthcare Providers: SeriousBroker.it  This test is not yet approved or cleared by the [place] FDA and has been authorized for detection and/or diagnosis of SARS-CoV-2 by FDA under an Emergency Use Authorization (EUA). This EUA will remain in effect (meaning this test can be used) for the duration of the COVID-19 declaration under Section 564(b)(1) of the Act, 21 U.S.C. section  360bbb-3(b)(1), unless the authorization is terminated or revoked.     Resp Syncytial Virus by PCR NEGATIVE NEGATIVE Final    Comment: (NOTE) Fact Sheet for Patients: BloggerCourse.com  Fact Sheet for Healthcare Providers: SeriousBroker.it  This test is not yet approved or cleared by the [place] FDA and has been authorized for detection and/or diagnosis of SARS-CoV-2 by FDA under an Emergency Use Authorization (EUA). This EUA will remain in effect (meaning this test can be used) for the duration of the COVID-19 declaration under Section 564(b)(1) of the Act, 21 U.S.C. section 360bbb-3(b)(1), unless the authorization is terminated or revoked.  Performed at [facility], [address] [place], [place] [address]      Radiology Studies: CT HEAD WO CONTRAST ( ) Result Date: 03/07/2023 CLINICAL DATA:  Altered mental status. Acute metabolic encephalopathy. Recent falls. EXAM: CT HEAD WITHOUT CONTRAST TECHNIQUE: Contiguous axial images were obtained from the base of the skull through the vertex without intravenous contrast. RADIATION DOSE REDUCTION: This exam was performed according to the departmental dose-optimization program which includes automated exposure control, adjustment of the mA and/or kV according to patient size and/or use of iterative reconstruction technique. COMPARISON:  Head CT 11/09/2022 FINDINGS: Brain: There is no evidence of an acute infarct, intracranial hemorrhage, mass, midline shift, or extra-axial fluid collection. Patchy cerebral white matter hypodensities are unchanged and nonspecific but compatible with moderate chronic small vessel ischemic disease. Mild cerebral atrophy is unchanged. Mild mineralization is again seen in the basal ganglia bilaterally. Vascular: Calcified atherosclerosis at the skull base. No hyperdense vessel. Skull: No acute fracture or suspicious osseous lesion. Sinuses/Orbits: Scattered mild  mucosal thickening in the paranasal sinuses. Small volume fluid in the sphenoid sinuses. No significant mastoid fluid. Bilateral cataract extraction. Other: None. IMPRESSION: 1. No evidence of acute intracranial abnormality. 2. Moderate chronic small vessel ischemic disease. Electronically Signed   By: Sebastian Ache M.D.   On: 03/07/2023 10:50   CT ABDOMEN PELVIS W CONTRAST Result Date: 03/06/2023 CLINICAL DATA:  Sepsis, recent bowel surgery. EXAM: CT ABDOMEN AND PELVIS WITH CONTRAST TECHNIQUE: Multidetector CT imaging of the abdomen and pelvis was performed using the standard protocol following bolus administration of intravenous contrast. RADIATION DOSE REDUCTION: This exam was performed according to the departmental dose-optimization program which includes automated exposure control, adjustment of the mA and/or kV according to patient size and/or use of iterative reconstruction technique. CONTRAST:  OMNIPAQUE IOHEXOL 300 MG/ML  SOLN COMPARISON:  Report of 11/09/2022 is reviewed. Images are not available at the time of dictation. FINDINGS: Lower chest: Dependent atelectasis bilaterally. Atherosclerotic calcification of the aorta. Heart is enlarged. No pericardial effusion. There may be trace left pleural fluid. Hepatobiliary: Liver and gallbladder are unremarkable. No biliary ductal dilatation. Pancreas: Negative. Spleen: Negative. Adrenals/Urinary Tract: Adrenal glands are unremarkable. Subcentimeter low-attenuation lesions in the right kidney, too small to characterize. No specific follow-up necessary. Kidneys are otherwise unremarkable. Ureters are decompressed. Bladder is grossly unremarkable. Stomach/Bowel: Hiatal hernia repair. Stomach and small bowel are unremarkable. Appendix is not readily identified. Moderate stool burden. Patient is status post rectopexy. Posterior perirectal hyperattenuating fluid collection measures 5.0 x 8.3 cm and exerts mass effect on the rectum. Additional edema and fluid  in the sigmoid mesocolon. Vascular/Lymphatic: Atherosclerotic calcification of the aorta. No pathologically enlarged lymph nodes. Reproductive: Uterus is visualized.  No adnexal mass. Other: Small bilateral inguinal hernias contain fat. Tiny umbilical hernia contains fat. No free fluid. Musculoskeletal: Degenerative changes in the spine. Grade 1 anterolisthesis of L4 on L5. Old L2 compression fracture. Old upper sacral fracture. Mild anterior wedging of T11, old. IMPRESSION: 1. Posterior perirectal hemorrhage status post rectopexy 02/24/2023, with mass effect on the rectum. Additional associated edema and fluid in the sigmoid mesocolon. 2. Trace left pleural fluid. 3.  Aortic atherosclerosis (ICD10-I70.0). Electronically Signed   By: Leanna Battles M.D.   On: 03/06/2023 19:17   DG Chest Port 1 View Result Date: 03/06/2023 CLINICAL DATA:  Shortness of breath, dyspnea.  Wheezing. EXAM: PORTABLE CHEST 1 VIEW COMPARISON:  07/15/2022 and chest CT from 11/09/2022 FINDINGS: Atherosclerotic calcification of the aortic arch. Mild enlargement of the cardiopericardial silhouette without edema. Airway thickening is present, suggesting bronchitis or reactive airways disease. Degenerative glenohumeral arthropathy bilaterally. No blunting of the costophrenic angles. IMPRESSION: 1. Airway thickening is present,  suggesting bronchitis or reactive airways disease. 2. Mild enlargement of the cardiopericardial silhouette without edema. 3. Aortic Atherosclerosis (ICD10-I70.0). Electronically Signed   By: Gaylyn Rong M.D.   On: 03/06/2023 17:02    Scheduled Meds:  buPROPion  75 mg Oral BID   enoxaparin (LOVENOX) injection  40 mg Subcutaneous Q24H   lamoTRIgine  200 mg Oral BID   levothyroxine  25 mcg Oral Q0600   methylPREDNISolone (SOLU-MEDROL) injection  0.5 mg/kg Intravenous Q12H   Followed by   Melene Muller ON 03/10/2023] predniSONE  50 mg Oral Daily   metoprolol tartrate  12.5 mg Oral BID   mometasone-formoterol  2 puff  Inhalation BID   nirmatrelvir/ritonavir (renal dosing)  2 tablet Oral BID   Continuous Infusions:   LOS: 2 days   Time spent: 55 mins  Nanette Wirsing Laural Benes, MD How to contact the [facility] Attending or Consulting provider 7A - 7P or covering provider during after hours 7P -7A, for this patient?  Check the care team in [facility] and look for a) attending/consulting [facility] provider listed and b) the [facility] team listed Log into [email or web address] to find provider on call.  Locate the [facility] provider you are looking for under [facility] and page to a number that you can be directly reached. If you still have difficulty reaching the provider, please page the [facility] (Director on Call) for the Hospitalists listed on amion for assistance.  03/08/2023, 12:20 PM

## 2023-03-09 DIAGNOSIS — J9601 Acute respiratory failure with hypoxia: Secondary | ICD-10-CM | POA: Diagnosis not present

## 2023-03-09 DIAGNOSIS — U071 COVID-19: Secondary | ICD-10-CM | POA: Diagnosis not present

## 2023-03-09 DIAGNOSIS — G9341 Metabolic encephalopathy: Secondary | ICD-10-CM | POA: Diagnosis not present

## 2023-03-09 LAB — COMPREHENSIVE METABOLIC PANEL
ALT: 22 U/L (ref 0–44)
AST: 29 U/L (ref 15–41)
Albumin: 3.6 g/dL (ref 3.5–5.0)
Alkaline Phosphatase: 60 U/L (ref 38–126)
Anion gap: 11 (ref 5–15)
BUN: 23 mg/dL (ref 8–23)
CO2: 24 mmol/L (ref 22–32)
Calcium: 9.2 mg/dL (ref 8.9–10.3)
Chloride: 100 mmol/L (ref 98–111)
Creatinine, Ser: 0.63 mg/dL (ref 0.44–1.00)
GFR, Estimated: >60 mL/min (ref >60)
Glucose, Bld: 126 mg/dL — ABNORMAL HIGH (ref 70–99)
Potassium: 3.6 mmol/L (ref 3.5–5.1)
Sodium: 135 mmol/L (ref 135–145)
Total Bilirubin: 0.8 mg/dL (ref 0.0–1.2)
Total Protein: 6.5 g/dL (ref 6.5–8.1)

## 2023-03-09 LAB — TSH: TSH: 0.695 u[IU]/mL (ref 0.350–4.500)

## 2023-03-09 LAB — FOLATE: Folate: 11.4 ng/mL (ref >5.9)

## 2023-03-09 LAB — VITAMIN B12: Vitamin B-12: 257 pg/mL (ref 180–914)

## 2023-03-09 MED ORDER — OXYCODONE HCL 5 MG PO TABS
10.0000 mg | ORAL_TABLET | Freq: Three times a day (TID) | ORAL | Status: DC | PRN
  Administered 2023-03-09 – 2023-03-11 (×6): 10 mg via ORAL
  Filled 2023-03-09 (×6): qty 2

## 2023-03-09 MED ORDER — LATANOPROST 0.005 % OP SOLN: 1.0000 [drp] | Freq: Every day | OPHTHALMIC | Status: DC

## 2023-03-09 MED ORDER — MELATONIN 3 MG PO TABS
3.0000 mg | ORAL_TABLET | Freq: Every day | ORAL | Status: DC
  Administered 2023-03-09 – 2023-03-11 (×3): 3 mg via ORAL
  Filled 2023-03-09 (×3): qty 1

## 2023-03-09 NOTE — Plan of Care (Signed)
  Problem: Education: Goal: Knowledge of risk factors and measures for prevention of condition will improve Outcome: Progressing   Problem: Education: Goal: Knowledge of General Education information will improve Description: Including pain rating scale, medication(s)/side effects and non-pharmacologic comfort measures Outcome: Progressing   Problem: Safety: Goal: Ability to remain free from injury will improve Outcome: Progressing

## 2023-03-09 NOTE — Plan of Care (Signed)

## 2023-03-09 NOTE — Plan of Care (Signed)
  Problem: Education: Goal: Knowledge of risk factors and measures for prevention of condition will improve Outcome: Progressing   Problem: Coping: Goal: Psychosocial and spiritual needs will be supported Outcome: Progressing   Problem: Respiratory: Goal: Will maintain a patent airway Outcome: Progressing   Problem: Education: Goal: Knowledge of General Education information will improve Description: Including pain rating scale, medication(s)/side effects and non-pharmacologic comfort measures Outcome: Progressing   Problem: Health Behavior/Discharge Planning: Goal: Ability to manage health-related needs will improve Outcome: Progressing   Problem: Activity: Goal: Risk for activity intolerance will decrease Outcome: Progressing

## 2023-03-09 NOTE — Progress Notes (Addendum)
 PROGRESS NOTE   Heidi Fuentes  FMW:[id number] DOB: January 15, 1946 DOA: 03/06/2023 PCP: [name], DO   Chief Complaint  Patient presents with   Shortness of Breath   Level of care: Telemetry  Brief Admission History:  78 y.o. female with medical history significant for asthma, bipolar disorder, depression, hypertension, hypothyroidism, fibromyalgia.  Patient was brought to the ED reports of altered mental status, cough and difficulty breathing.  In the time of my evaluation, he is unable to answer questions, she is sedated, patient is status post 1 mg of Ativan , arouses to touch, speech is incomprehensible, and drifts back to sleep.  Per triage notes-EMS was called for above symptoms, they report patient stopped breathing at some point en route, per ED provider does not confirm this occurred, did not get this in signout from EMS. EMS was called out earlier today to patients home by patients nurse-  patient was awake and alert and refused transport at that time, but with change in mental status, patient was unable to talk or walk, EMS was called again-reports O2 sats of 86% with gurgling respiration.  She was placed on 2 L.   Recent hospitalization 1/22 to 1/26 for rectal prolapse, patient had XI robotic assisted rectopexy by Dr. Debby, Dr. Sheldon.   ED Course: Temperature 97.4.  Heart rate 66-113.  Respiratory rate mostly below 20, range- 15-41.  Blood pressure systolic 137-1 52.  O2 sats down to 87% on room air. WBC 9.1. COVID-positive. ABG with pH of 7.36, pCO2 of 46. Chest x-ray shows airway thickening, bronchitis or reactive airway disease. CT abdomen and pelvis with contrast-posterior perirectal hemorrhage status post rectopexy, with mass effect on the rectum.  Additional associated edema and fluid in the sigmoid mesocolon. EDP talked to Dr. Sebastian with general surgery,-okay to admit here, monitor, small enough that hemorrhage will resolve, no need for transfer.   Assessment and  Plan:  Acute metabolic encephalopathy On my evaluation, patient is sedated status post Ativan , but she awakens to voice but speech is incomprehensible.  Initial AMS likely secondary to-COVID infection, hypoxia.  Patient's son reported patient has had several falls recently, combative with caregivers, on narcotic pain medications, and takes excessive amounts of gabapentin , noncompliant with psych meds.  ABG with normal pH and pCO2. -Obtain head CT:   No evidence of acute intracranial abnormality.  Moderate chronic small vessel ischemic disease -haloperidol  ordered as needed for agitation.   -PT eval when able, TOC consult, patient likely will need placement -Wellbutrin , Prozac , lamotrigine  on home med list, hold prozac  for now while on paxlovid  per pharm D, ok to resume lamotrigine  and bupropion  -Follow-up UA-- no signs of infection seen -Follow blood cultures- no growth to date  -check dementia lab panel  Acute hypoxic respiratory failure  O2 sats down to 87% on room air, on 2 L. Likely COVID virus bronchitis as etiology for hypoxia.  Chest x-ray suggesting bronchitis or reactive airway disease.  CT shows trace pleural fluid.  COVID-positive.  -ABG pH of 7.39, pCO2 of 46. -Hold off on further imaging for now, patient just received contrast load for CT abdomen -continue bronchodilators, supplemental oxygen  and respiratory support   COVID-19 virus infection Presenting with hypoxia, cough, dyspnea.  Chest x-ray suggestive of bronchitis or reactive airway disease.  At this time no suggestion of thromboembolic etiology, patient also just received contrast for CT abdomen, deferred further chest imaging. - With hypoxia will start Solu-Medrol -60 mg x 1, continue with weight based dosing - Paxlovid   ordered  - initially trended inflammatory markers, but now trending down and stopped checking   Rectal prolapse Recent hospitalization 1/22 to 1/26 for rectal prolapse, patient had XI robotic assisted  rectopexy by Dr. Debby, Dr. Sheldon. -CT abdomen and pelvis with contrast on admission 03/06/23 - Posterior perirectal hemorrhage status post rectopexy 02/24/2023, with mass effect on the rectum. Additional associated edema and fluid in the sigmoid mesocolon - EDP discussed with Dr. Sebastian with general surgery and had her to review images, she recommended: -okay to admit to AP, monitor, small enough that hemorrhage will resolve on its own, no need for transfer to [facility].  Hypokalemia - oral replacement ordered, recheck Mg   Bipolar affective disorder, depressed, mild  Patient noncompliant with psych meds per multiple reports -Resumed home lamotrigine  and bupropion .   Holding fluoxetine  per pharm D while on paxlovid  -we've received reports that patient unable to care for self at home and she currently does NOT have decisional capacity at this time.  TOC working on APS referral to petition guardianship.    Asthma, chronic Lung exam unremarkable -Albuterol  nebs as needed -Resume home regimen  DVT prophylaxis: enoxaparin  Code Status: Full  Family Communication:  Disposition: TBD  Consultants:   Procedures:   Antimicrobials:    Subjective: Remains confused and disoriented.  Has been sundowning.  She is eating small amounts and drinking fluids and has been able to take her meds.    Objective: Vitals:   03/08/23 2014 03/08/23 2034 03/08/23 2157 03/09/23 0632  BP:  (!) 154/65 (!) 154/65 (!) 181/84  Pulse:   93   Resp:      Temp:  98.2 F (36.8 C)  97.7 F (36.5 C)  TempSrc:  Oral  Oral  SpO2: 100%     Weight:      Height:        Intake/Output Summary (Last 24 hours) at 03/09/2023 1133 Last data filed at 03/09/2023 1000 Gross per 24 hour  Intake 480 ml  Output --  Net 480 ml   Filed Weights   03/06/23 2230  Weight: 52 kg   Examination:  General exam: Appears calm and comfortable, sedated.   Respiratory system: mild tachypnea. Cardiovascular system: normal S1 & S2 heard. No  JVD, murmurs, rubs, gallops or clicks. No pedal edema. Gastrointestinal system: Abdomen is nondistended, soft and nontender. No organomegaly or masses felt. Normal bowel sounds heard. Central nervous system: somnolent. No focal neurological deficits. Extremities: Symmetric 5 x 5 power. Skin: No rashes, lesions or ulcers. Psychiatry: Judgement and insight appear UTD. Mood & affect UTD.   Data Reviewed: I have personally reviewed following labs and imaging studies  CBC: Recent Labs  Lab 03/06/23 1641 03/07/23 0456 03/08/23 0538  WBC 9.1 3.6* 7.2  NEUTROABS 4.0  --   --   HGB 10.3* 10.1* 10.4*  HCT 33.6* 31.3* 32.6*  MCV 102.4* 100.0 97.6  PLT 315 290 337    Basic Metabolic Panel: Recent Labs  Lab 03/06/23 1641 03/07/23 0456 03/08/23 0538 03/09/23 0633  NA 137 137 139 135  K 4.1 3.7 3.2* 3.6  CL 100 100 103 100  CO2 26 26 23 24   GLUCOSE 83 138* 136* 126*  BUN 17 17 19 23   CREATININE 0.80 0.57 0.63 0.63  CALCIUM  9.1 8.8* 9.0 9.2  MG  --   --  2.4  --     CBG: Recent Labs  Lab 03/07/23 0008  GLUCAP 140*    Recent Results (from the past  240 hours)  Blood Culture (routine x 2)     Status: None (Preliminary result)   Collection Time: 03/06/23  4:41 PM   Specimen: BLOOD  Result Value Ref Range Status   Specimen Description BLOOD BLOOD RIGHT ARM  Final   Special Requests NONE  Final   Culture   Final    NO GROWTH 3 DAYS Performed at [facility], [address] [place], [place] [address]    Report Status PENDING  Incomplete  Blood Culture (routine x 2)     Status: None (Preliminary result)   Collection Time: 03/06/23  4:41 PM   Specimen: BLOOD  Result Value Ref Range Status   Specimen Description BLOOD  Final   Special Requests NONE  Final   Culture   Final    NO GROWTH 3 DAYS Performed at [facility], [address] [place], [place] [address]    Report Status PENDING  Incomplete  Resp panel by RT-PCR (RSV, Flu A&B, Covid) Anterior Nasal Swab     Status:  Abnormal   Collection Time: 03/06/23  4:42 PM   Specimen: Anterior Nasal Swab  Result Value Ref Range Status   SARS Coronavirus 2 by RT PCR POSITIVE (A) NEGATIVE Final    Comment: (NOTE) SARS-CoV-2 target nucleic acids are DETECTED.  The SARS-CoV-2 RNA is generally detectable in upper respiratory specimens during the acute phase of infection. Positive results are indicative of the presence of the identified virus, but do not rule out bacterial infection or co-infection with other pathogens not detected by the test. Clinical correlation with patient history and other diagnostic information is necessary to determine patient infection status. The expected result is Negative.  Fact Sheet for Patients: bloggercourse.com  Fact Sheet for Healthcare Providers: seriousbroker.it  This test is not yet approved or cleared by the [place]  FDA and  has been authorized for detection and/or diagnosis of SARS-CoV-2 by FDA under an Emergency Use Authorization (EUA).  This EUA will remain in effect (meaning this test can be used) for the duration of  the COVID-19 declaration under Section 564(b)(1) of the A ct, 21 U.S.C. section 360bbb-3(b)(1), unless the authorization is terminated or revoked sooner.     Influenza A by PCR NEGATIVE NEGATIVE Final   Influenza B by PCR NEGATIVE NEGATIVE Final    Comment: (NOTE) The Xpert Xpress SARS-CoV-2/FLU/RSV plus assay is intended as an aid in the diagnosis of influenza from Nasopharyngeal swab specimens and should not be used as a sole basis for treatment. Nasal washings and aspirates are unacceptable for Xpert Xpress SARS-CoV-2/FLU/RSV testing.  Fact Sheet for Patients: bloggercourse.com  Fact Sheet for Healthcare Providers: seriousbroker.it  This test is not yet approved or cleared by the [place]  FDA and has been authorized for detection  and/or diagnosis of SARS-CoV-2 by FDA under an Emergency Use Authorization (EUA). This EUA will remain in effect (meaning this test can be used) for the duration of the COVID-19 declaration under Section 564(b)(1) of the Act, 21 U.S.C. section 360bbb-3(b)(1), unless the authorization is terminated or revoked.     Resp Syncytial Virus by PCR NEGATIVE NEGATIVE Final    Comment: (NOTE) Fact Sheet for Patients: bloggercourse.com  Fact Sheet for Healthcare Providers: seriousbroker.it  This test is not yet approved or cleared by the [place]  FDA and has been authorized for detection and/or diagnosis of SARS-CoV-2 by FDA under an Emergency Use Authorization (EUA). This EUA will remain in effect (meaning this test can be used) for the duration  of the COVID-19 declaration under Section 564(b)(1) of the Act, 21 U.S.C. section 360bbb-3(b)(1), unless the authorization is terminated or revoked.  Performed at [facility], [address] [place], [place] [address]      Radiology Studies: No results found.   Scheduled Meds:  buPROPion   75 mg Oral BID   enoxaparin  (LOVENOX ) injection  40 mg Subcutaneous Q24H   lamoTRIgine   200 mg Oral BID   levothyroxine   25 mcg Oral Q0600   methylPREDNISolone  (SOLU-MEDROL ) injection  0.5 mg/kg Intravenous Q12H   Followed by   NOREEN ON 03/10/2023] predniSONE   50 mg Oral Daily   metoprolol  tartrate  12.5 mg Oral BID   mometasone -formoterol   2 puff Inhalation BID   nirmatrelvir /ritonavir  (renal dosing)  2 tablet Oral BID   Continuous Infusions:   LOS: 3 days   Time spent: 50 mins  Mehreen Azizi Vicci, MD How to contact the [facility] Attending or Consulting provider 7A - 7P or covering provider during after hours 7P -7A, for this patient?  Check the care team in [facility] and look for a) attending/consulting TRH provider listed and b) the TRH team listed Log into [email or web address] to find provider on call.  Locate  the TRH provider you are looking for under [facility] and page to a number that you can be directly reached. If you still have difficulty reaching the provider, please page the [facility] (Director on Call) for the Hospitalists listed on amion for assistance.  03/09/2023, 11:33 AM

## 2023-03-09 NOTE — TOC Progression Note (Signed)
 Transition of Care [facility]) - Progression Note    Patient Details  Name: Heidi Fuentes MRN: [id number] Date of Birth: [birth year]  Transition of [facility]) CM/SW Contact  Mcarthur Saddie Kim, [place] Phone Number: 03/09/2023, 12:42 PM  Clinical Narrative: Per Roanne with [facility]PS, she has assessed pt and will staff case with her supervisor. Janine reports pt was more oriented today. She also indicates there is HCPOA paperwork on file and pt's sons are listed as HCPOA. Janine plans to discuss with son. LCSW updated MD. TOC will follow.      Expected Discharge Plan:  (unsure at this time) Barriers to Discharge: Continued Medical Work up  Expected Discharge Plan and Services In-house Referral: Clinical Social Work     Living arrangements for the past 2 months: Single Family Home                                       Social Determinants of Health (SDOH) Interventions SDOH Screenings   Food Insecurity: Patient Unable To Answer (03/07/2023)  Housing: Patient Unable To Answer (03/07/2023)  Transportation Needs: Patient Unable To Answer (03/07/2023)  Utilities: Patient Unable To Answer (03/07/2023)  Social Connections: Patient Unable To Answer (03/07/2023)  Recent Concern: Social Connections - Socially Isolated (02/24/2023)  Tobacco Use: Low Risk  (02/24/2023)    Readmission Risk Interventions    03/07/2023    2:31 PM 11/10/2022   11:05 AM  Readmission Risk Prevention Plan  Transportation Screening Complete Complete  Medication Review Oceanographer) Complete Complete  PCP or Specialist appointment within 3-5 days of discharge  Not Complete  HRI or Home Care Consult Complete Complete  SW Recovery Care/Counseling Consult Complete Complete  Palliative Care Screening Not Applicable Not Applicable  Skilled Nursing Facility -- Not Complete

## 2023-03-10 DIAGNOSIS — U071 COVID-19: Secondary | ICD-10-CM | POA: Diagnosis not present

## 2023-03-10 DIAGNOSIS — G9341 Metabolic encephalopathy: Secondary | ICD-10-CM | POA: Diagnosis not present

## 2023-03-10 DIAGNOSIS — J9601 Acute respiratory failure with hypoxia: Secondary | ICD-10-CM | POA: Diagnosis not present

## 2023-03-10 LAB — CBC
HCT: 40.4 % (ref 36.0–46.0)
Hemoglobin: 12.7 g/dL (ref 12.0–15.0)
MCH: 32.2 pg (ref 26.0–34.0)
MCHC: 31.4 g/dL (ref 30.0–36.0)
MCV: 102.3 fL — ABNORMAL HIGH (ref 80.0–100.0)
Platelets: 285 10*3/uL (ref 150–400)
RBC: 3.95 MIL/uL (ref 3.87–5.11)
RDW: 13.4 % (ref 11.5–15.5)
WBC: 5.9 10*3/uL (ref 4.0–10.5)
nRBC: 0 % (ref 0.0–0.2)

## 2023-03-10 LAB — BASIC METABOLIC PANEL
Anion gap: 11 (ref 5–15)
BUN: 29 mg/dL — ABNORMAL HIGH (ref 8–23)
CO2: 23 mmol/L (ref 22–32)
Calcium: 8.9 mg/dL (ref 8.9–10.3)
Chloride: 99 mmol/L (ref 98–111)
Creatinine, Ser: 0.8 mg/dL (ref 0.44–1.00)
GFR, Estimated: >60 mL/min (ref >60)
Glucose, Bld: 141 mg/dL — ABNORMAL HIGH (ref 70–99)
Potassium: 3.9 mmol/L (ref 3.5–5.1)
Sodium: 133 mmol/L — ABNORMAL LOW (ref 135–145)

## 2023-03-10 LAB — RPR: RPR Ser Ql: NONREACTIVE

## 2023-03-10 LAB — MAGNESIUM: Magnesium: 2.6 mg/dL — ABNORMAL HIGH (ref 1.7–2.4)

## 2023-03-10 MED ORDER — ORAL CARE MOUTH RINSE: 15.0000 mL | OROMUCOSAL | Status: DC | PRN

## 2023-03-10 NOTE — Evaluation (Signed)
 Physical Therapy Evaluation Patient Details Name: Heidi Fuentes MRN: [id number] DOB: [birth year] Today's Date: 03/10/2023  History of Present Illness  Heidi Fuentes is a 78 y.o. female with medical history significant for asthma, bipolar disorder, depression, hypertension, hypothyroidism, fibromyalgia.  Patient was brought to the ED reports of altered mental status, cough and difficulty breathing.  In the time of my evaluation, he is unable to answer questions, she is sedated, patient is status post 1 mg of Ativan , arouses to touch, speech is incomprehensible, and drifts back to sleep.   Per triage notes-EMS was called for above symptoms, they report patient stopped breathing at some point en route, per ED provider does not confirm this occurred, did not get this in signout from EMS.  EMS was called out earlier today to patients home by patients nurse-  patient was awake and alert and refused transport at that time, but with change in mental status, patient was unable to talk or walk, EMS was called again-reports O2 sats of 86% with gurgling respiration.  She was placed on 2 L.     Recent hospitalization 1/22 to 1/26 for rectal prolapse, patient had XI robotic assisted rectopexy by Dr. Debby, Dr. Sheldon.   Clinical Impression  Patient demonstrates slow labored movement for sitting up at bedside, unsteady on feet and required the use of RW for safety during transfers.  Patient able to ambulate in room with labored movement, no loss of balance and limited mostly due to fatigue.  Patient tolerated sitting up in chair after therapy with nursing staff present.  Patient will benefit from continued skilled physical therapy in hospital and recommended venue below to increase strength, balance, endurance for safe ADLs and gait.         If plan is discharge home, recommend the following: A lot of help with bathing/dressing/bathroom;A little help with walking and/or transfers;Help with stairs or ramp for  entrance;Assistance with cooking/housework   Can travel by private vehicle   Yes    Equipment Recommendations None recommended by PT  Recommendations for Other Services       Functional Status Assessment Patient has had a recent decline in their functional status and demonstrates the ability to make significant improvements in function in a reasonable and predictable amount of time.     Precautions / Restrictions Precautions Precautions: Fall Restrictions Weight Bearing Restrictions Per Provider Order: No      Mobility  Bed Mobility Overal bed mobility: Needs Assistance Bed Mobility: Supine to Sit     Supine to sit: Min assist     General bed mobility comments: increased time, labored movement    Transfers Overall transfer level: Needs assistance Equipment used: Rolling walker (2 wheels) Transfers: Sit to/from Stand, Bed to chair/wheelchair/BSC Sit to Stand: Min assist   Step pivot transfers: Min assist       General transfer comment: unsteady on feet, increased time    Ambulation/Gait Ambulation/Gait assistance: Min assist Gait Distance (Feet): 40 Feet Assistive device: Rolling walker (2 wheels) Gait Pattern/deviations: Decreased step length - left, Decreased stance time - right, Decreased stride length Gait velocity: decreased     General Gait Details: slow labored cadence wtihout loss of balance, limited mostly due to fatigue, on room air with SpO2 at 93-96%  Stairs            Wheelchair Mobility     Tilt Bed    Modified Rankin (Stroke Patients Only)       Balance Overall balance assessment:  Needs assistance Sitting-balance support: Feet supported Sitting balance-Leahy Scale: Fair Sitting balance - Comments: fair/good seated at EOB   Standing balance support: During functional activity, Bilateral upper extremity supported Standing balance-Leahy Scale: Fair Standing balance comment: using RW                              Pertinent Vitals/Pain Pain Assessment Pain Assessment: No/denies pain    Home Living Family/patient expects to be discharged to:: Private residence Living Arrangements: Alone Available Help at Discharge: Family;Available PRN/intermittently Type of Home: House Home Access: Ramped entrance       Home Layout: One level Home Equipment: Agricultural Consultant (2 wheels);Cane - single point;Wheelchair Financial Trader (4 wheels);Shower seat;Lift chair;Transport chair      Prior Function Prior Level of Function : Needs assist       Physical Assist : Mobility (physical);ADLs (physical) Mobility (physical): Bed mobility;Transfers;Gait;Stairs   Mobility Comments: Ambulates within the home without AD; RW used in community. Assisted for toilet transfer. ADLs Comments: Pt reports assist for toilet transfer; wears depends. Reports independence with bathing and dressing. IADL assist via a cleaning person.     Extremity/Trunk Assessment   Upper Extremity Assessment Upper Extremity Assessment: Generalized weakness    Lower Extremity Assessment Lower Extremity Assessment: Generalized weakness    Cervical / Trunk Assessment Cervical / Trunk Assessment: Normal  Communication   Communication Communication: No apparent difficulties Cueing Techniques: Verbal cues;Tactile cues  Cognition Arousal: Alert Behavior During Therapy: WFL for tasks assessed/performed Overall Cognitive Status: Within Functional Limits for tasks assessed                                 General Comments: appears slightly confused, but able to follow directions consistently with occasionl verbal/tactile cueing        General Comments      Exercises     Assessment/Plan    PT Assessment Patient needs continued PT services  PT Problem List Decreased strength;Decreased activity tolerance;Decreased balance;Decreased mobility       PT Treatment Interventions DME instruction;Gait training;Stair  training;Functional mobility training;Therapeutic activities;Therapeutic exercise;Balance training;Patient/family education    PT Goals (Current goals can be found in the Care Plan section)  Acute Rehab PT Goals Patient Stated Goal: return home with family to assist PT Goal Formulation: With patient Time For Goal Achievement: 03/24/23 Potential to Achieve Goals: Good    Frequency Min 3X/week     Co-evaluation               AM-PAC PT 6 Clicks Mobility  Outcome Measure Help needed turning from your back to your side while in a flat bed without using bedrails?: A Little Help needed moving from lying on your back to sitting on the side of a flat bed without using bedrails?: A Little Help needed moving to and from a bed to a chair (including a wheelchair)?: A Little Help needed standing up from a chair using your arms (e.g., wheelchair or bedside chair)?: A Little Help needed to walk in hospital room?: A Little Help needed climbing 3-5 steps with a railing? : A Lot 6 Click Score: 17    End of Session   Activity Tolerance: Patient tolerated treatment well;Patient limited by fatigue Patient left: in chair;with call bell/phone within reach;with nursing/sitter in room Nurse Communication: Mobility status PT Visit Diagnosis: Unsteadiness on feet (R26.81);Other abnormalities of gait and  mobility (R26.89);Muscle weakness (generalized) (M62.81)    Time: 8849-8777 PT Time Calculation (min) (ACUTE ONLY): 32 min   Charges:   PT Evaluation $PT Eval Moderate Complexity: 1 Mod PT Treatments $Therapeutic Activity: 23-37 mins PT General Charges $$ ACUTE PT VISIT: 1 Visit         1:42 PM, 03/10/23 Lynwood Music, MPT Physical Therapist with [facility] [phone] office [phone] mobile phone

## 2023-03-10 NOTE — TOC Progression Note (Signed)
 Transition of Care [facility]) - Progression Note    Patient Details  Name: Heidi Fuentes MRN: [id number] Date of Birth: 1945/10/12  Transition of [facility]) CM/SW Contact  Mcarthur Saddie Kim, [place] Phone Number: 03/10/2023, 1:37 PM  Clinical Narrative: LCSW spoke with Heidi Fuentes with [facility]PS for update. She states she has spoken with sons and they are not willing to act as HCPOA although they are named HCPOA and will not make decisions for her. Janine aware pt is more oriented today. She plans to see pt this afternoon and will staff case again with her supervisor and director at [facility]. LCSW shared PT recommending SNF. Heidi Fuentes will discuss SNF with pt this afternoon if pt appears to be able to make decisions. Heidi Fuentes will update Heidi Fuentes, [place] tomorrow morning on plan. FL2 is completed for SNF if needed.        Expected Discharge Plan:  (unsure at this time) Barriers to Discharge: Continued Medical Work up  Expected Discharge Plan and Services In-house Referral: Clinical Social Work     Living arrangements for the past 2 months: Single Family Home                                       Social Determinants of Health (SDOH) Interventions SDOH Screenings   Food Insecurity: Patient Unable To Answer (03/07/2023)  Housing: Patient Unable To Answer (03/07/2023)  Transportation Needs: Patient Unable To Answer (03/07/2023)  Utilities: Patient Unable To Answer (03/07/2023)  Social Connections: Patient Unable To Answer (03/07/2023)  Recent Concern: Social Connections - Socially Isolated (02/24/2023)  Tobacco Use: Low Risk  (02/24/2023)    Readmission Risk Interventions    03/07/2023    2:31 PM 11/10/2022   11:05 AM  Readmission Risk Prevention Plan  Transportation Screening Complete Complete  Medication Review Oceanographer) Complete Complete  PCP or Specialist appointment within 3-5 days of discharge  Not Complete  HRI or Home Care Consult Complete Complete  SW Recovery Care/Counseling  Consult Complete Complete  Palliative Care Screening Not Applicable Not Applicable  Skilled Nursing Facility -- Not Complete

## 2023-03-10 NOTE — Progress Notes (Signed)
 PROGRESS NOTE  Heidi Fuentes FMW:[id number] DOB: 04-07-1945 DOA: 03/06/2023 PCP: [name], DO  Brief History:  78 y.o. female with medical history significant for asthma, bipolar disorder, depression, hypertension, hypothyroidism, fibromyalgia.  Patient was brought to the ED reports of altered mental status, cough and difficulty breathing.  In the time of my evaluation, he is unable to answer questions, she is sedated, patient is status post 1 mg of Ativan , arouses to touch, speech is incomprehensible, and drifts back to sleep.  Per triage notes-EMS was called for above symptoms, they report patient stopped breathing at some point en route, per ED provider does not confirm this occurred, did not get this in signout from EMS. EMS was called out earlier today to patients home by patients nurse-  patient was awake and alert and refused transport at that time, but with change in mental status, patient was unable to talk or walk, EMS was called again-reports O2 sats of 86% with gurgling respiration.  She was placed on 2 L.   Recent hospitalization 1/22 to 1/26 for rectal prolapse, patient had XI robotic assisted rectopexy by Dr. Debby, Dr. Sheldon.   ED Course: Temperature 97.4.  Heart rate 66-113.  Respiratory rate mostly below 20, range- 15-41.  Blood pressure systolic 137-1 52.  O2 sats down to 87% on room air. WBC 9.1. COVID-positive. ABG with pH of 7.36, pCO2 of 46. Chest x-ray shows airway thickening, bronchitis or reactive airway disease. CT abdomen and pelvis with contrast-posterior perirectal hemorrhage status post rectopexy, with mass effect on the rectum.  Additional associated edema and fluid in the sigmoid mesocolon. EDP talked to Dr. Sebastian with general surgery,-okay to admit here, monitor, small enough that hemorrhage will resolve, no need for transfer.   Assessment/Plan: Acute metabolic encephalopathy --initially sedated status post Ativan , but she awakens to voice  but speech is incomprehensible.  -AMS likely secondary to-COVID infection, hypoxia.   -Patient's son reported patient has had several falls recently, combative with caregivers, on narcotic pain medications, and takes excessive amounts of gabapentin , noncompliant with psych meds.   -2/1 ABG--7.39/46/135/27 -2/1 head CT:   No evidence of acute intracranial abnormality.  Moderate chronic small vessel ischemic disease -haloperidol  ordered as needed for agitation.   -PT eval = SNF -Wellbutrin , Prozac , lamotrigine  on home med list,  -hold prozac  for now while on paxlovid  per pharm D, ok to resume lamotrigine  and bupropion  -Follow-up UA-- no pyuria -Follow blood cultures- no growth to date  -B12--257 -folate 11.4 -TSH 0.695 -2/5 pt is A&O x 3   Acute hypoxic respiratory failure  -O2 sats down to 87% on room air, on 2 L.  -due COVID virus bronchitis  -Chest x-ray suggesting bronchitis or reactive airway disease.  CT shows trace pleural fluid.  COVID-positive.  -continue bronchodilators, supplemental oxygen  and respiratory support    COVID-19 virus infection Presenting with hypoxia, cough, dyspnea.  Chest x-ray suggestive of bronchitis or reactive airway disease. - With hypoxia will start Solu-Medrol -60 mg x 1 -continue prednisone  - Paxlovid  ordered  - initially trended inflammatory markers, but now trending down and stopped checking    Rectal prolapse Recent hospitalization 1/22 to 1/26 for rectal prolapse, patient had XI robotic assisted rectopexy by Dr. Debby, Dr. Sheldon. -CT abdomen and pelvis with contrast on admission 03/06/23 - Posterior perirectal hemorrhage status post rectopexy 02/24/2023, with mass effect on the rectum. Additional associated edema and fluid in the sigmoid mesocolon - EDP  discussed with Dr. Sebastian with general surgery and had her to review images, she recommended: -okay to admit to [facility], monitor, small enough that hemorrhage will resolve on its own, no need for  transfer to [facility].   Hypokalemia - oral replacement ordered, recheck Mg    Bipolar affective disorder, depressed, mild  Patient noncompliant with psych meds per multiple reports -Resumed home lamotrigine  and bupropion .   Holding fluoxetine  per pharm D while on paxlovid  -we've received reports that patient unable to care for self at home  -initially did not have capacity to make decision   Asthma, chronic -Albuterol  nebs as needed -Resume home regimen        Family Communication:   no Family at bedside  Consultants:  none  Code Status:  FULL  DVT Prophylaxis:   [place] Lovenox    Procedures: As Listed in Progress Note Above  Antibiotics: None       Subjective: Patient denies fevers, chills, headache, chest pain, dyspnea, nausea, vomiting, diarrhea, abdominal pain, dysuria, hematuria, hematochezia, and melena.   Objective: Vitals:   03/10/23 0455 03/10/23 0540 03/10/23 0715 03/10/23 1354  BP: (!) 157/70 (!) 167/82  138/66  Pulse: 79  84   Resp: 17 20 16    Temp: (!) 97 F (36.1 C)   97.7 F (36.5 C)  TempSrc: Oral   Oral  SpO2: 93%  97% 98%  Weight:      Height:       No intake or output data in the 24 hours ending 03/10/23 1848 Weight change:  Exam:  General:  Pt is alert, follows commands appropriately, not in acute distress HEENT: No icterus, No thrush, No neck mass, [place]/AT Cardiovascular: RRR, S1/S2, no rubs, no gallops Respiratory: bilateral rales.  No wheeze Abdomen: Soft/+BS, non tender, non distended, no guarding Extremities: No edema, No lymphangitis, No petechiae, No rashes, no synovitis   Data Reviewed: I have personally reviewed following labs and imaging studies Basic Metabolic Panel: Recent Labs  Lab 03/06/23 1641 03/07/23 0456 03/08/23 0538 03/09/23 0633 03/10/23 0444  NA 137 137 139 135 133*  K 4.1 3.7 3.2* 3.6 3.9  CL 100 100 103 100 99  CO2 26 26 23 24 23   GLUCOSE 83 138* 136* 126* 141*  BUN 17 17 19 23  29*  CREATININE 0.80  0.57 0.63 0.63 0.80  CALCIUM  9.1 8.8* 9.0 9.2 8.9  MG  --   --  2.4  --  2.6*   Liver Function Tests: Recent Labs  Lab 03/06/23 1641 03/08/23 0538 03/09/23 0633  AST 23 27 29   ALT 18 20 22   ALKPHOS 67 61 60  BILITOT 0.8 0.8 0.8  PROT 6.7 6.5 6.5  ALBUMIN 3.5 3.4* 3.6   No results for input(s): LIPASE, AMYLASE in the last 168 hours. No results for input(s): AMMONIA in the last 168 hours. Coagulation Profile: Recent Labs  Lab 03/06/23 1641  INR 1.0   CBC: Recent Labs  Lab 03/06/23 1641 03/07/23 0456 03/08/23 0538 03/10/23 0444  WBC 9.1 3.6* 7.2 5.9  NEUTROABS 4.0  --   --   --   HGB 10.3* 10.1* 10.4* 12.7  HCT 33.6* 31.3* 32.6* 40.4  MCV 102.4* 100.0 97.6 102.3*  PLT 315 290 337 285   Cardiac Enzymes: No results for input(s): CKTOTAL, CKMB, CKMBINDEX, TROPONINI in the last 168 hours. BNP: Invalid input(s): POCBNP CBG: Recent Labs  Lab 03/07/23 0008  GLUCAP 140*   HbA1C: No results for input(s): HGBA1C in the last 72  hours. Urine analysis:    Component Value Date/Time   COLORURINE YELLOW 03/08/2023 0800   APPEARANCEUR HAZY (A) 03/08/2023 0800   APPEARANCEUR Cloudy 06/02/2012 1042   LABSPEC 1.023 03/08/2023 0800   LABSPEC 1.017 06/02/2012 1042   PHURINE 6.0 03/08/2023 0800   GLUCOSEU NEGATIVE 03/08/2023 0800   GLUCOSEU Negative 06/02/2012 1042   HGBUR NEGATIVE 03/08/2023 0800   BILIRUBINUR NEGATIVE 03/08/2023 0800   BILIRUBINUR Negative 06/02/2012 1042   KETONESUR 5 (A) 03/08/2023 0800   PROTEINUR 30 (A) 03/08/2023 0800   UROBILINOGEN 0.2 12/11/2014 1429   NITRITE NEGATIVE 03/08/2023 0800   LEUKOCYTESUR NEGATIVE 03/08/2023 0800   LEUKOCYTESUR 3+ 06/02/2012 1042   Sepsis Labs: @LABRCNTIP (procalcitonin:4,lacticidven:4) ) Recent Results (from the past 240 hours)  Blood Culture (routine x 2)     Status: None (Preliminary result)   Collection Time: 03/06/23  4:41 PM   Specimen: BLOOD  Result Value Ref Range Status   Specimen  Description BLOOD BLOOD RIGHT ARM  Final   Special Requests NONE  Final   Culture   Final    NO GROWTH 4 DAYS Performed at [facility], [address], [place], [place] [address]    Report Status PENDING  Incomplete  Blood Culture (routine x 2)     Status: None (Preliminary result)   Collection Time: 03/06/23  4:41 PM   Specimen: BLOOD  Result Value Ref Range Status   Specimen Description BLOOD  Final   Special Requests NONE  Final   Culture   Final    NO GROWTH 4 DAYS Performed at [facility], [address], [place], [place] [address]    Report Status PENDING  Incomplete  Resp panel by RT-PCR (RSV, Flu A&B, Covid) Anterior Nasal Swab     Status: Abnormal   Collection Time: 03/06/23  4:42 PM   Specimen: Anterior Nasal Swab  Result Value Ref Range Status   SARS Coronavirus 2 by RT PCR POSITIVE (A) NEGATIVE Final    Comment: (NOTE) SARS-CoV-2 target nucleic acids are DETECTED.  The SARS-CoV-2 RNA is generally detectable in upper respiratory specimens during the acute phase of infection. Positive results are indicative of the presence of the identified virus, but do not rule out bacterial infection or co-infection with other pathogens not detected by the test. Clinical correlation with patient history and other diagnostic information is necessary to determine patient infection status. The expected result is Negative.  Fact Sheet for Patients: bloggercourse.com  Fact Sheet for Healthcare Providers: seriousbroker.it  This test is not yet approved or cleared by the [place]  FDA and  has been authorized for detection and/or diagnosis of SARS-CoV-2 by FDA under an Emergency Use Authorization (EUA).  This EUA will remain in effect (meaning this test can be used) for the duration of  the COVID-19 declaration under Section 564(b)(1) of the A ct, 21 U.S.C. section 360bbb-3(b)(1), unless the authorization is terminated or revoked  sooner.     Influenza A by PCR NEGATIVE NEGATIVE Final   Influenza B by PCR NEGATIVE NEGATIVE Final    Comment: (NOTE) The Xpert Xpress SARS-CoV-2/FLU/RSV plus assay is intended as an aid in the diagnosis of influenza from Nasopharyngeal swab specimens and should not be used as a sole basis for treatment. Nasal washings and aspirates are unacceptable for Xpert Xpress SARS-CoV-2/FLU/RSV testing.  Fact Sheet for Patients: bloggercourse.com  Fact Sheet for Healthcare Providers: seriousbroker.it  This test is not yet approved or cleared by the [place]  FDA and has been authorized for  detection and/or diagnosis of SARS-CoV-2 by FDA under an Emergency Use Authorization (EUA). This EUA will remain in effect (meaning this test can be used) for the duration of the COVID-19 declaration under Section 564(b)(1) of the Act, 21 U.S.C. section 360bbb-3(b)(1), unless the authorization is terminated or revoked.     Resp Syncytial Virus by PCR NEGATIVE NEGATIVE Final    Comment: (NOTE) Fact Sheet for Patients: bloggercourse.com  Fact Sheet for Healthcare Providers: seriousbroker.it  This test is not yet approved or cleared by the [place]  FDA and has been authorized for detection and/or diagnosis of SARS-CoV-2 by FDA under an Emergency Use Authorization (EUA). This EUA will remain in effect (meaning this test can be used) for the duration of the COVID-19 declaration under Section 564(b)(1) of the Act, 21 U.S.C. section 360bbb-3(b)(1), unless the authorization is terminated or revoked.  Performed at [facility], [address] [place], [place] [address]      Scheduled Meds:  buPROPion   75 mg Oral BID   enoxaparin  (LOVENOX ) injection  40 mg Subcutaneous Q24H   lamoTRIgine   200 mg Oral BID   latanoprost   1 drop Both Eyes QHS   levothyroxine   25 mcg Oral Q0600   melatonin  3 mg  Oral QHS   metoprolol  tartrate  12.5 mg Oral BID   mometasone -formoterol   2 puff Inhalation BID   nirmatrelvir /ritonavir  (renal dosing)  2 tablet Oral BID   predniSONE   50 mg Oral Daily   Continuous Infusions:  Procedures/Studies: CT HEAD WO CONTRAST ( ) Result Date: 03/07/2023 CLINICAL DATA:  Altered mental status. Acute metabolic encephalopathy. Recent falls. EXAM: CT HEAD WITHOUT CONTRAST TECHNIQUE: Contiguous axial images were obtained from the base of the skull through the vertex without intravenous contrast. RADIATION DOSE REDUCTION: This exam was performed according to the departmental dose-optimization program which includes automated exposure control, adjustment of the mA and/or kV according to patient size and/or use of iterative reconstruction technique. COMPARISON:  Head CT 11/09/2022 FINDINGS: Brain: There is no evidence of an acute infarct, intracranial hemorrhage, mass, midline shift, or extra-axial fluid collection. Patchy cerebral white matter hypodensities are unchanged and nonspecific but compatible with moderate chronic small vessel ischemic disease. Mild cerebral atrophy is unchanged. Mild mineralization is again seen in the basal ganglia bilaterally. Vascular: Calcified atherosclerosis at the skull base. No hyperdense vessel. Skull: No acute fracture or suspicious osseous lesion. Sinuses/Orbits: Scattered mild mucosal thickening in the paranasal sinuses. Small volume fluid in the sphenoid sinuses. No significant mastoid fluid. Bilateral cataract extraction. Other: None. IMPRESSION: 1. No evidence of acute intracranial abnormality. 2. Moderate chronic small vessel ischemic disease. Electronically Signed   By: Dasie Hamburg M.D.   On: 03/07/2023 10:50   CT ABDOMEN PELVIS W CONTRAST Result Date: 03/06/2023 CLINICAL DATA:  Sepsis, recent bowel surgery. EXAM: CT ABDOMEN AND PELVIS WITH CONTRAST TECHNIQUE: Multidetector CT imaging of the abdomen and pelvis was performed using the standard  protocol following bolus administration of intravenous contrast. RADIATION DOSE REDUCTION: This exam was performed according to the departmental dose-optimization program which includes automated exposure control, adjustment of the mA and/or kV according to patient size and/or use of iterative reconstruction technique. CONTRAST:  100mL OMNIPAQUE  IOHEXOL  300 MG/ML  SOLN COMPARISON:  Report of 11/09/2022 is reviewed. Images are not available at the time of dictation. FINDINGS: Lower chest: Dependent atelectasis bilaterally. Atherosclerotic calcification of the aorta. Heart is enlarged. No pericardial effusion. There may be trace left pleural fluid. Hepatobiliary: Liver and gallbladder are unremarkable. No biliary ductal  dilatation. Pancreas: Negative. Spleen: Negative. Adrenals/Urinary Tract: Adrenal glands are unremarkable. Subcentimeter low-attenuation lesions in the right kidney, too small to characterize. No specific follow-up necessary. Kidneys are otherwise unremarkable. Ureters are decompressed. Bladder is grossly unremarkable. Stomach/Bowel: Hiatal hernia repair. Stomach and small bowel are unremarkable. Appendix is not readily identified. Moderate stool burden. Patient is status post rectopexy. Posterior perirectal hyperattenuating fluid collection measures 5.0 x 8.3 cm and exerts mass effect on the rectum. Additional edema and fluid in the sigmoid mesocolon. Vascular/Lymphatic: Atherosclerotic calcification of the aorta. No pathologically enlarged lymph nodes. Reproductive: Uterus is visualized.  No adnexal mass. Other: Small bilateral inguinal hernias contain fat. Tiny umbilical hernia contains fat. No free fluid. Musculoskeletal: Degenerative changes in the spine. Grade 1 anterolisthesis of L4 on L5. Old L2 compression fracture. Old upper sacral fracture. Mild anterior wedging of T11, old. IMPRESSION: 1. Posterior perirectal hemorrhage status post rectopexy 02/24/2023, with mass effect on the rectum.  Additional associated edema and fluid in the sigmoid mesocolon. 2. Trace left pleural fluid. 3.  Aortic atherosclerosis (ICD10-I70.0). Electronically Signed   By: Newell Eke M.D.   On: 03/06/2023 19:17   DG Chest Port 1 View Result Date: 03/06/2023 CLINICAL DATA:  Shortness of breath, dyspnea.  Wheezing. EXAM: PORTABLE CHEST 1 VIEW COMPARISON:  07/15/2022 and chest CT from 11/09/2022 FINDINGS: Atherosclerotic calcification of the aortic arch. Mild enlargement of the cardiopericardial silhouette without edema. Airway thickening is present, suggesting bronchitis or reactive airways disease. Degenerative glenohumeral arthropathy bilaterally. No blunting of the costophrenic angles. IMPRESSION: 1. Airway thickening is present, suggesting bronchitis or reactive airways disease. 2. Mild enlargement of the cardiopericardial silhouette without edema. 3. Aortic Atherosclerosis (ICD10-I70.0). Electronically Signed   By: Ryan Salvage M.D.   On: 03/06/2023 17:02    Alm Schneider, DO  [facility]  If 7PM-7AM, please contact night-coverage [email or web address] Password [email or web address] 03/10/2023, 6:48 PM   LOS: 4 days

## 2023-03-10 NOTE — Plan of Care (Signed)
  Problem: Acute Rehab PT Goals(only PT should resolve) Goal: Pt Will Go Supine/Side To Sit Outcome: Progressing Flowsheets (Taken 03/10/2023 1343) Pt will go Supine/Side to Sit:  with supervision  with contact guard assist Goal: Patient Will Transfer Sit To/From Stand Outcome: Progressing Flowsheets (Taken 03/10/2023 1343) Patient will transfer sit to/from stand:  with supervision  with contact guard assist Goal: Pt Will Transfer Bed To Chair/Chair To Bed Outcome: Progressing Flowsheets (Taken 03/10/2023 1343) Pt will Transfer Bed to Chair/Chair to Bed:  with supervision  with contact guard assist Goal: Pt Will Ambulate Outcome: Progressing Flowsheets (Taken 03/10/2023 1343) Pt will Ambulate:  75 feet  with contact guard assist  with supervision  with rolling walker   1:44 PM, 03/10/23 Lynwood Music, MPT Physical Therapist with [employer] 336 [phone] office [phone] mobile phone

## 2023-03-11 DIAGNOSIS — G9341 Metabolic encephalopathy: Secondary | ICD-10-CM | POA: Diagnosis not present

## 2023-03-11 DIAGNOSIS — G894 Chronic pain syndrome: Secondary | ICD-10-CM

## 2023-03-11 DIAGNOSIS — J9601 Acute respiratory failure with hypoxia: Secondary | ICD-10-CM | POA: Diagnosis not present

## 2023-03-11 DIAGNOSIS — U071 COVID-19: Secondary | ICD-10-CM | POA: Diagnosis not present

## 2023-03-11 LAB — GLUCOSE, CAPILLARY: Glucose-Capillary: 178 mg/dL — ABNORMAL HIGH (ref 70–99)

## 2023-03-11 LAB — CULTURE, BLOOD (ROUTINE X 2)
Culture: NO GROWTH
Culture: NO GROWTH

## 2023-03-11 MED ORDER — FLUOXETINE HCL 40 MG PO CAPS: 40.0000 mg | ORAL_CAPSULE | Freq: Every day | ORAL | Status: AC

## 2023-03-11 MED ORDER — POLYETHYLENE GLYCOL 3350 17 G PO PACK: 17.0000 g | PACK | Freq: Every day | ORAL | Status: DC | PRN

## 2023-03-11 MED ORDER — METOPROLOL TARTRATE 25 MG PO TABS: 12.5000 mg | ORAL_TABLET | Freq: Two times a day (BID) | ORAL | Status: DC

## 2023-03-11 MED ORDER — OXYCODONE HCL 10 MG PO TABS: 10.0000 mg | ORAL_TABLET | Freq: Three times a day (TID) | ORAL | 0 refills | Status: DC | PRN

## 2023-03-11 MED ORDER — PREDNISONE 50 MG PO TABS: 50.0000 mg | ORAL_TABLET | Freq: Every day | ORAL | Status: DC

## 2023-03-11 MED ORDER — NIRMATRELVIR/RITONAVIR (PAXLOVID) TABLET (RENAL DOSING): 2.0000 | ORAL_TABLET | Freq: Two times a day (BID) | ORAL | Status: AC

## 2023-03-11 NOTE — Discharge Summary (Signed)
 Physician Discharge Summary   Patient: Heidi Fuentes MRN: [id number] DOB: 1945-05-02  Admit date:     03/06/2023  Discharge date: 03/11/23  Discharge Physician: Alm Shaton Lore   PCP: [name], DO   Recommendations at discharge:   Please follow up with primary care provider within 1-2 weeks  Please repeat BMP and CBC in one week      Hospital Course: 78 y.o. female with medical history significant for asthma, bipolar disorder, depression, hypertension, hypothyroidism, fibromyalgia.  Patient was brought to the ED reports of altered mental status, cough and difficulty breathing.  In the time of my evaluation, he is unable to answer questions, she is sedated, patient is status post 1 mg of Ativan , arouses to touch, speech is incomprehensible, and drifts back to sleep.  Per triage notes-EMS was called for above symptoms, they report patient stopped breathing at some point en route, per ED provider does not confirm this occurred, did not get this in signout from EMS. EMS was called out earlier today to patients home by patients nurse-  patient was awake and alert and refused transport at that time, but with change in mental status, patient was unable to talk or walk, EMS was called again-reports O2 sats of 86% with gurgling respiration.  She was placed on 2 L.   Recent hospitalization 1/22 to 1/26 for rectal prolapse, patient had XI robotic assisted rectopexy by Dr. Debby, Dr. Sheldon.   ED Course: Temperature 97.4.  Heart rate 66-113.  Respiratory rate mostly below 20, range- 15-41.  Blood pressure systolic 137-1 52.  O2 sats down to 87% on room air. WBC 9.1. COVID-positive. ABG with pH of 7.36, pCO2 of 46. Chest x-ray shows airway thickening, bronchitis or reactive airway disease. CT abdomen and pelvis with contrast-posterior perirectal hemorrhage status post rectopexy, with mass effect on the rectum.  Additional associated edema and fluid in the sigmoid mesocolon. EDP talked to Dr.  Sebastian with general surgery,-okay to admit here, monitor, small enough that hemorrhage will resolve, no need for transfer.  Assessment and Plan: Acute metabolic encephalopathy --initially sedated status post Ativan , but she awakens to voice but speech is incomprehensible.  -AMS likely secondary to-COVID infection, hypoxia.   -Patient's son reported patient has had several falls recently, combative with caregivers, on narcotic pain medications, and takes excessive amounts of gabapentin , noncompliant with psych meds.   -03/11/23--pt is A&O x 4 -2/1 ABG--7.39/46/135/27 -2/1 head CT:   No evidence of acute intracranial abnormality.  Moderate chronic small vessel ischemic disease -haloperidol  ordered as needed for agitation.   -PT eval = SNF -Wellbutrin , Prozac , lamotrigine  on home med list,  -hold prozac  for now while on paxlovid  per pharm D, ok to resume lamotrigine  and bupropion  -Follow-up UA-- no pyuria -Follow blood cultures- no growth to date  -B12--257 -folate 11.4 -TSH 0.695   Acute hypoxic respiratory failure  -O2 sats down to 87% on room air, on 2 L initially -due COVID virus bronchitis  -Chest x-ray suggesting bronchitis or reactive airway disease.  CT shows trace pleural fluid.  COVID-positive.  -continue bronchodilators, supplemental oxygen  -pulmonary toilet   COVID-19 virus infection Presenting with hypoxia, cough, dyspnea.  Chest x-ray suggestive of bronchitis or reactive airway disease. - given Solu-Medrol  2/2 to 2/5 -continue prednisone  (started 2/5)>>3 more days after d/c - Paxlovid  ordered >>final dose on 03/11/23 at 2200 - initially trended inflammatory markers, but now trending down and stopped checking    Rectal prolapse Recent hospitalization 1/22 to 1/26 for  rectal prolapse, patient had XI robotic assisted rectopexy by Dr. Debby, Dr. Sheldon. -CT abdomen and pelvis with contrast on admission 03/06/23 - Posterior perirectal hemorrhage status post rectopexy  02/24/2023, with mass effect on the rectum. Additional associated edema and fluid in the sigmoid mesocolon - EDP discussed with Dr. Sebastian with general surgery and had her to review images, he recommended: -okay to admit to [facility], monitor, small enough that hemorrhage will resolve on its own, no need for transfer to [facility]. -Hgb stable   Hypokalemia - oral replacement ordered, recheck Mg 2.6   Bipolar affective disorder, depressed, mild  Patient noncompliant with psych meds per multiple reports -Resumed home lamotrigine  and bupropion .   Holding fluoxetine  per pharm D while on paxlovid  -we've received reports that patient unable to care for self at home  -initially did not have capacity to make decision   Asthma, chronic -Albuterol  nebs as needed -Resume home regimen         Consultants: none Procedures performed: none  Disposition: Skilled nursing facility Diet recommendation:  Regular diet DISCHARGE MEDICATION: Allergies as of 03/11/2023       Reactions   Sulfa Antibiotics Rash, Hives   Mirtazapine Other (See Comments)   HA's   Trazodone Other (See Comments)   Trazodone Hcl Other (See Comments)   strange dreams   Ziprasidone Other (See Comments)   cardiac issues   Aripiprazole Other (See Comments)   Bad dreams   Quetiapine  Palpitations   Trazodone And Nefazodone Other (See Comments)   unknown   Ziprasidone Hcl Other (See Comments)   hospitalization specifics        Medication List     STOP taking these medications    diphenhydrAMINE  25 mg capsule Commonly known as: BENADRYL    fluticasone  50 MCG/ACT nasal spray Commonly known as: FLONASE    ketoconazole  2 % cream Commonly known as: NIZORAL        TAKE these medications    acetaminophen  500 MG tablet Commonly known as: TYLENOL  Take 500 mg by mouth every 6 (six) hours as needed for mild pain or moderate pain. For pain   Breztri  Aerosphere 160-9-4.8 MCG/ACT Aero Generic drug:  Budeson-Glycopyrrol-Formoterol  Inhale into the lungs.   buPROPion  75 MG tablet Commonly known as: WELLBUTRIN  Take 1 tablet (75 mg total) by mouth 2 (two) times daily.   FLUoxetine  40 MG capsule Commonly known as: PROZAC  Take 1 capsule (40 mg total) by mouth daily. Start on 03/13/23 Start taking on: March 13, 2023 What changed:  additional instructions These instructions start on March 13, 2023. If you are unsure what to do until then, ask your doctor or other care provider.   gabapentin  300 MG capsule Commonly known as: NEURONTIN  Take 600 mg by mouth 4 (four) times daily.   lamoTRIgine  200 MG tablet Commonly known as: LAMICTAL  Take 200 mg by mouth 2 (two) times daily.   latanoprost  0.005 % ophthalmic solution Commonly known as: XALATAN  1 drop daily.   levothyroxine  25 MCG tablet Commonly known as: SYNTHROID  Take 25 mcg by mouth daily before breakfast.   Lidocaine -Hydrocortisone  Ace 3-0.5 % Crea Apply pea size amount to anorectum 4 times a day as needed   losartan  25 MG tablet Commonly known as: COZAAR  Take 1 tablet (25 mg total) by mouth daily.   melatonin 5 MG Tabs Take 5 mg by mouth at bedtime.   metoprolol  tartrate 25 MG tablet Commonly known as: LOPRESSOR  Take 0.5 tablets (12.5 mg total) by mouth 2 (two) times daily.  montelukast  10 MG tablet Commonly known as: SINGULAIR  Take 10 mg by mouth daily.   nirmatrelvir /ritonavir  (renal dosing) 10 x 150 MG & 10 x 100MG  Tabs Commonly known as: PAXLOVID  Take 2 tablets by mouth 2 (two) times daily for 5 days. Patient GFR is >60.  Last dose as indicated below Take nirmatrelvir  (150 mg) one tablet at 2200 03/11/23 and ritonavir  (100 mg) one tablet 2200 03/11/23   Oxycodone  HCl 10 MG Tabs Take 1 tablet (10 mg total) by mouth every 8 (eight) hours as needed.   pantoprazole  20 MG tablet Commonly known as: PROTONIX  SMARTSIG:1.0 Tablet(s) By Mouth Daily   polycarbophil 625 MG tablet Commonly known as: FIBERCON Take 1  tablet (625 mg total) by mouth daily.   polyethylene glycol 17 g packet Commonly known as: MIRALAX  / GLYCOLAX  Take 17 g by mouth daily as needed for mild constipation.   predniSONE  50 MG tablet Commonly known as: DELTASONE  Take 1 tablet (50 mg total) by mouth daily. X 3 days Start taking on: March 12, 2023   Symbicort 160-4.5 MCG/ACT inhaler Generic drug: budesonide -formoterol  Inhale 2 puffs into the lungs in the morning and at bedtime.   Ventolin  HFA 108 (90 Base) MCG/ACT inhaler Generic drug: albuterol  SMARTSIG:1 Puff(s) By Mouth Every 4 Hours PRN        Contact information for after-discharge care     Destination     [facility] .   Service: Skilled Nursing Contact information: [address] [place]  [phone]                    Discharge Exam: [name] Weights   03/06/23 2230  Weight: 52 kg   HEENT:  [place]/AT, No thrush, no icterus CV:  RRR, no rub, no S3, no S4 Lung:  bibasilar rales.  No wheeze Abd:  soft/+BS, NT Ext:  No edema, no lymphangitis, no synovitis, no rash   Condition at discharge: stable  The results of significant diagnostics from this hospitalization (including imaging, microbiology, ancillary and laboratory) are listed below for reference.   Imaging Studies: CT HEAD WO CONTRAST ( ) Result Date: 03/07/2023 CLINICAL DATA:  Altered mental status. Acute metabolic encephalopathy. Recent falls. EXAM: CT HEAD WITHOUT CONTRAST TECHNIQUE: Contiguous axial images were obtained from the base of the skull through the vertex without intravenous contrast. RADIATION DOSE REDUCTION: This exam was performed according to the departmental dose-optimization program which includes automated exposure control, adjustment of the mA and/or kV according to patient size and/or use of iterative reconstruction technique. COMPARISON:  Head CT 11/09/2022 FINDINGS: Brain: There is no evidence of an acute infarct,  intracranial hemorrhage, mass, midline shift, or extra-axial fluid collection. Patchy cerebral white matter hypodensities are unchanged and nonspecific but compatible with moderate chronic small vessel ischemic disease. Mild cerebral atrophy is unchanged. Mild mineralization is again seen in the basal ganglia bilaterally. Vascular: Calcified atherosclerosis at the skull base. No hyperdense vessel. Skull: No acute fracture or suspicious osseous lesion. Sinuses/Orbits: Scattered mild mucosal thickening in the paranasal sinuses. Small volume fluid in the sphenoid sinuses. No significant mastoid fluid. Bilateral cataract extraction. Other: None. IMPRESSION: 1. No evidence of acute intracranial abnormality. 2. Moderate chronic small vessel ischemic disease. Electronically Signed   By: Dasie Hamburg M.D.   On: 03/07/2023 10:50   CT ABDOMEN PELVIS W CONTRAST Result Date: 03/06/2023 CLINICAL DATA:  Sepsis, recent bowel surgery. EXAM: CT ABDOMEN AND PELVIS WITH CONTRAST TECHNIQUE: Multidetector CT imaging of the abdomen  and pelvis was performed using the standard protocol following bolus administration of intravenous contrast. RADIATION DOSE REDUCTION: This exam was performed according to the departmental dose-optimization program which includes automated exposure control, adjustment of the mA and/or kV according to patient size and/or use of iterative reconstruction technique. CONTRAST:  OMNIPAQUE  IOHEXOL  300 MG/ML  SOLN COMPARISON:  Report of 11/09/2022 is reviewed. Images are not available at the time of dictation. FINDINGS: Lower chest: Dependent atelectasis bilaterally. Atherosclerotic calcification of the aorta. Heart is enlarged. No pericardial effusion. There may be trace left pleural fluid. Hepatobiliary: Liver and gallbladder are unremarkable. No biliary ductal dilatation. Pancreas: Negative. Spleen: Negative. Adrenals/Urinary Tract: Adrenal glands are unremarkable. Subcentimeter low-attenuation lesions in  the right kidney, too small to characterize. No specific follow-up necessary. Kidneys are otherwise unremarkable. Ureters are decompressed. Bladder is grossly unremarkable. Stomach/Bowel: Hiatal hernia repair. Stomach and small bowel are unremarkable. Appendix is not readily identified. Moderate stool burden. Patient is status post rectopexy. Posterior perirectal hyperattenuating fluid collection measures 5.0 x 8.3 cm and exerts mass effect on the rectum. Additional edema and fluid in the sigmoid mesocolon. Vascular/Lymphatic: Atherosclerotic calcification of the aorta. No pathologically enlarged lymph nodes. Reproductive: Uterus is visualized.  No adnexal mass. Other: Small bilateral inguinal hernias contain fat. Tiny umbilical hernia contains fat. No free fluid. Musculoskeletal: Degenerative changes in the spine. Grade 1 anterolisthesis of L4 on L5. Old L2 compression fracture. Old upper sacral fracture. Mild anterior wedging of T11, old. IMPRESSION: 1. Posterior perirectal hemorrhage status post rectopexy 02/24/2023, with mass effect on the rectum. Additional associated edema and fluid in the sigmoid mesocolon. 2. Trace left pleural fluid. 3.  Aortic atherosclerosis (ICD10-I70.0). Electronically Signed   By: Newell Eke M.D.   On: 03/06/2023 19:17   DG Chest Port 1 View Result Date: 03/06/2023 CLINICAL DATA:  Shortness of breath, dyspnea.  Wheezing. EXAM: PORTABLE CHEST 1 VIEW COMPARISON:  07/15/2022 and chest CT from 11/09/2022 FINDINGS: Atherosclerotic calcification of the aortic arch. Mild enlargement of the cardiopericardial silhouette without edema. Airway thickening is present, suggesting bronchitis or reactive airways disease. Degenerative glenohumeral arthropathy bilaterally. No blunting of the costophrenic angles. IMPRESSION: 1. Airway thickening is present, suggesting bronchitis or reactive airways disease. 2. Mild enlargement of the cardiopericardial silhouette without edema. 3. Aortic  Atherosclerosis (ICD10-I70.0). Electronically Signed   By: Ryan Salvage M.D.   On: 03/06/2023 17:02    Microbiology: Results for orders placed or performed during the hospital encounter of 03/06/23  Blood Culture (routine x 2)     Status: None   Collection Time: 03/06/23  4:41 PM   Specimen: BLOOD  Result Value Ref Range Status   Specimen Description BLOOD BLOOD RIGHT ARM  Final   Special Requests NONE  Final   Culture   Final    NO GROWTH 5 DAYS Performed at [facility], [address] [place], [place] [address]    Report Status 03/11/2023 FINAL  Final  Blood Culture (routine x 2)     Status: None   Collection Time: 03/06/23  4:41 PM   Specimen: BLOOD  Result Value Ref Range Status   Specimen Description BLOOD  Final   Special Requests NONE  Final   Culture   Final    NO GROWTH 5 DAYS Performed at [facility], [address] [place], [place] [address]    Report Status 03/11/2023 FINAL  Final  Resp panel by RT-PCR (RSV, Flu A&B, Covid) Anterior Nasal Swab     Status: Abnormal  Collection Time: 03/06/23  4:42 PM   Specimen: Anterior Nasal Swab  Result Value Ref Range Status   SARS Coronavirus 2 by RT PCR POSITIVE (A) NEGATIVE Final    Comment: (NOTE) SARS-CoV-2 target nucleic acids are DETECTED.  The SARS-CoV-2 RNA is generally detectable in upper respiratory specimens during the acute phase of infection. Positive results are indicative of the presence of the identified virus, but do not rule out bacterial infection or co-infection with other pathogens not detected by the test. Clinical correlation with patient history and other diagnostic information is necessary to determine patient infection status. The expected result is Negative.  Fact Sheet for Patients: bloggercourse.com  Fact Sheet for Healthcare Providers: seriousbroker.it  This test is not yet approved or cleared by the [place]  FDA and  has been  authorized for detection and/or diagnosis of SARS-CoV-2 by FDA under an Emergency Use Authorization (EUA).  This EUA will remain in effect (meaning this test can be used) for the duration of  the COVID-19 declaration under Section 564(b)(1) of the A ct, 21 U.S.C. section 360bbb-3(b)(1), unless the authorization is terminated or revoked sooner.     Influenza A by PCR NEGATIVE NEGATIVE Final   Influenza B by PCR NEGATIVE NEGATIVE Final    Comment: (NOTE) The Xpert Xpress SARS-CoV-2/FLU/RSV plus assay is intended as an aid in the diagnosis of influenza from Nasopharyngeal swab specimens and should not be used as a sole basis for treatment. Nasal washings and aspirates are unacceptable for Xpert Xpress SARS-CoV-2/FLU/RSV testing.  Fact Sheet for Patients: bloggercourse.com  Fact Sheet for Healthcare Providers: seriousbroker.it  This test is not yet approved or cleared by the [place]  FDA and has been authorized for detection and/or diagnosis of SARS-CoV-2 by FDA under an Emergency Use Authorization (EUA). This EUA will remain in effect (meaning this test can be used) for the duration of the COVID-19 declaration under Section 564(b)(1) of the Act, 21 U.S.C. section 360bbb-3(b)(1), unless the authorization is terminated or revoked.     Resp Syncytial Virus by PCR NEGATIVE NEGATIVE Final    Comment: (NOTE) Fact Sheet for Patients: bloggercourse.com  Fact Sheet for Healthcare Providers: seriousbroker.it  This test is not yet approved or cleared by the [place]  FDA and has been authorized for detection and/or diagnosis of SARS-CoV-2 by FDA under an Emergency Use Authorization (EUA). This EUA will remain in effect (meaning this test can be used) for the duration of the COVID-19 declaration under Section 564(b)(1) of the Act, 21 U.S.C. section 360bbb-3(b)(1), unless the  authorization is terminated or revoked.  Performed at [facility], [address], [place], [place] [address]     Labs: CBC: Recent Labs  Lab 03/06/23 1641 03/07/23 0456 03/08/23 0538 03/10/23 0444  WBC 9.1 3.6* 7.2 5.9  NEUTROABS 4.0  --   --   --   HGB 10.3* 10.1* 10.4* 12.7  HCT 33.6* 31.3* 32.6* 40.4  MCV 102.4* 100.0 97.6 102.3*  PLT 315 290 337 285   Basic Metabolic Panel: Recent Labs  Lab 03/06/23 1641 03/07/23 0456 03/08/23 0538 03/09/23 0633 03/10/23 0444  NA 137 137 139 135 133*  K 4.1 3.7 3.2* 3.6 3.9  CL 100 100 103 100 99  CO2 26 26 23 24 23   GLUCOSE 83 138* 136* 126* 141*  BUN 17 17 19 23  29*  CREATININE 0.80 0.57 0.63 0.63 0.80  CALCIUM  9.1 8.8* 9.0 9.2 8.9  MG  --   --  2.4  --  2.6*  Liver Function Tests: Recent Labs  Lab 03/06/23 1641 03/08/23 0538 03/09/23 0633  AST 23 27 29   ALT 18 20 22   ALKPHOS 67 61 60  BILITOT 0.8 0.8 0.8  PROT 6.7 6.5 6.5  ALBUMIN 3.5 3.4* 3.6   CBG: Recent Labs  Lab 03/07/23 0008  GLUCAP 140*    Discharge time spent: greater than 30 minutes.  Signed: Alm Schneider, MD [facility] 03/11/2023

## 2023-03-11 NOTE — Progress Notes (Signed)
 This RN attempted to call report to [facility] x2 at # [phone] and # [phone]. No answer. Per CM/SW [place] she spoke with Nurse Liaison at [facility] who was able to inform her that patient can come today and room number was provided. In shift report this RN was told [facility] that she will be arriving late. This RN provided [employer]  with phone number if [facility] has questions. Kell[place] RN

## 2023-03-11 NOTE — Progress Notes (Signed)
 Pt remained A&O x 4 all night. She voices her concerns about going to SNF and prefers to go home with friends to help her.  She required a dose of oxycodone  last night for pain everywhere. Otherwise. Pt was pleasant and behavior appropriate throughout shift. Telesitter remains. Kellogg RN

## 2023-03-11 NOTE — Progress Notes (Signed)
 PT alert and oriented x 4 upon DC. [facility] to take patient to [facility] in [place]. Kellogg RN

## 2023-03-11 NOTE — Care Management Important Message (Signed)
 Important Message  Patient Details  Name: Heidi Fuentes MRN: [id number] Date of Birth: 1945/10/05   Important Message Given:  Yes - Medicare IM (reviewed letter telephonically with [name] at [phone])     Christropher Gintz L Rayven Hendrickson 03/11/2023, 4:27 PM

## 2023-03-11 NOTE — [place] FL2 (Signed)
 [place]  MEDICAID [id number] LEVEL OF CARE FORM     IDENTIFICATION  Patient Name: Heidi Fuentes Birthdate: May 12, 1945 Sex: female Admission Date (Current Location): 03/06/2023  [facility] and [place] Number:  [employer] and Address:  [facility],  [address] [place] [address]      Provider Number: [id number]  Attending Physician Name and Address:  Evonnie Lenis, MD  Relative Name and Phone Number:       Current Level of Care: Hospital Recommended Level of Care: Skilled Nursing Facility Prior Approval Number:    Date Approved/Denied:   PASRR Number: [id number]  Discharge Plan: SNF    Current Diagnoses: Patient Active Problem List   Diagnosis Date Noted   Acute metabolic encephalopathy 03/06/2023   COVID-19 virus infection 03/06/2023   Acute hypoxic respiratory failure (HCC) 03/06/2023   Rectal prolapse 02/24/2023   Sacral fracture, closed (HCC) 11/20/2022   Lumbar vertebral fracture (HCC) 11/20/2022   Compression fracture of pelvis (HCC) 11/10/2022   Aspiration pneumonia (HCC) 11/09/2022   Agitation 07/27/2022   Mixed bipolar I disorder (HCC) 07/22/2022   Delirium 07/22/2022   Depression, major, recurrent, mild (HCC) 07/22/2022   Severe sepsis (HCC) 07/15/2022   UTI (urinary tract infection) 07/15/2022   Encephalopathy, metabolic 07/15/2022   History of adenomatous polyp of colon 05/12/2021   Bipolar affective disorder, depressed, mild (HCC) 08/07/2020   Rhabdomyolysis 08/03/2020   Elevated LFTs 08/03/2020   Unilateral primary osteoarthritis, left hip 03/26/2020   Pedal edema 03/18/2020   Pain in joint involving ankle and foot 12/20/2019   Bronchiectasis without complication (HCC) 10/31/2019   DOE (dyspnea on exertion) 07/27/2019   Dysphagia 07/25/2018   Leukocytosis 07/25/2018   Closed fracture of left orbital floor (HCC) 07/25/2018   Exudative age-related macular degeneration, bilateral, with active choroidal neovascularization (HCC)  07/25/2018   CAP (community acquired pneumonia) due to MRSA (methicillin resistant Staphylococcus aureus) (HCC) 02/13/2018   Asthma, chronic, unspecified asthma severity, with acute exacerbation 02/04/2018   Cough 11/02/2017   Gastroesophageal reflux disease without esophagitis 10/30/2017   Other specified glaucoma 10/30/2017   Uncomplicated asthma 10/30/2017   Chronic rhinitis 10/30/2017   Long-term current use of opiate analgesic 03/10/2017   Essential hypertension 02/17/2017   On long term drug therapy 02/08/2017   Chronic narcotic use 02/04/2017   Hiatal hernia 02/03/2017   Hypokalemia 09/06/2015   Fall    Iron deficiency anemia, unspecified 12/15/2013   Schatzki's ring 08/17/2013   Anemia 08/17/2013   Encounter for screening colonoscopy 08/17/2013   Constipation 07/10/2013   Chronic pain syndrome 07/10/2013   Acquired hypothyroidism 07/09/2013   Colitis 07/09/2013   Asthma, chronic 07/09/2013    Orientation RESPIRATION BLADDER Height & Weight     Self  O2 (4L) Incontinent Weight: 52 kg Height:  4' 11 (149.9 cm)  BEHAVIORAL SYMPTOMS/MOOD NEUROLOGICAL BOWEL NUTRITION STATUS      Incontinent Diet (See d/c summary)  AMBULATORY STATUS COMMUNICATION OF NEEDS Skin   Extensive Assist Verbally Bruising                       Personal Care Assistance Level of Assistance  Bathing, Feeding, Dressing Bathing Assistance: Maximum assistance Feeding assistance: Limited assistance Dressing Assistance: Maximum assistance     Functional Limitations Info  Sight, Hearing, Speech Sight Info: Impaired Hearing Info: Impaired Speech Info: Adequate    SPECIAL CARE FACTORS FREQUENCY  PT (By licensed PT)     PT Frequency: 5x weekly  Contractures      Additional Factors Info  Code Status, Allergies, Psychotropic, Isolation Precautions Code Status Info: Full code Allergies Info: Sulfa Antibiotics, Mirtazapine,Trazodone, Trazodone Hcl, Ziprasidone,  Aripiprazole, Quetiapine , Trazodone And Nefazodone  Ziprasidone Hcl     Isolation Precautions Info: Airborne (COVID+ 03/06/23) and contact precautions     Current Medications (03/11/2023):  This is the current hospital active medication list Current Facility-Administered Medications  Medication Dose Route Frequency Provider Last Rate Last Admin   acetaminophen  (TYLENOL ) tablet 650 mg  650 mg Oral Q6H PRN Emokpae, Ejiroghene E, MD   650 mg at 03/10/23 0941   Or   acetaminophen  (TYLENOL ) suppository 650 mg  650 mg Rectal Q6H PRN Emokpae, Ejiroghene E, MD       albuterol  (PROVENTIL ) (2.5 MG/3ML) 0.083% nebulizer solution 2.5 mg  2.5 mg Nebulization Q4H PRN Emokpae, Ejiroghene E, MD   2.5 mg at 03/10/23 0553   buPROPion  (WELLBUTRIN ) tablet 75 mg  75 mg Oral BID Johnson, Clanford L, MD   75 mg at 03/11/23 9157   enoxaparin  (LOVENOX ) injection 40 mg  40 mg Subcutaneous Q24H Emokpae, Ejiroghene E, MD   40 mg at 03/11/23 0840   guaiFENesin -dextromethorphan  (ROBITUSSIN DM) 100-10 MG/5ML syrup 15 mL  15 mL Oral Q8H PRN Emokpae, Ejiroghene E, MD       haloperidol  lactate (HALDOL ) injection 5 mg  5 mg Intravenous Q6H PRN Johnson, Clanford L, MD   5 mg at 03/08/23 1034   lamoTRIgine  (LAMICTAL ) tablet 200 mg  200 mg Oral BID Johnson, Clanford L, MD   200 mg at 03/11/23 0841   latanoprost  (XALATAN ) 0.005 % ophthalmic solution 1 drop  1 drop Both Eyes QHS Johnson, Clanford L, MD       levothyroxine  (SYNTHROID ) tablet 25 mcg  25 mcg Oral Q0600 Emokpae, Ejiroghene E, MD   25 mcg at 03/11/23 9388   LORazepam  (ATIVAN ) injection 0.5 mg  0.5 mg Intravenous Q4H PRN Emokpae, Ejiroghene E, MD   0.5 mg at 03/09/23 0115   melatonin tablet 3 mg  3 mg Oral QHS Johnson, Clanford L, MD   3 mg at 03/10/23 2049   metoprolol  tartrate (LOPRESSOR ) tablet 12.5 mg  12.5 mg Oral BID Emokpae, Ejiroghene E, MD   12.5 mg at 03/11/23 9157   mometasone -formoterol  (DULERA ) 200-5 MCG/ACT inhaler 2 puff  2 puff Inhalation BID Emokpae,  Ejiroghene E, MD   2 puff at 03/10/23 0601   nirmatrelvir /ritonavir  (renal dosing) (PAXLOVID ) 2 tablet  2 tablet Oral BID Vicci, Clanford L, MD   2 tablet at 03/11/23 9156   ondansetron  (ZOFRAN ) tablet 4 mg  4 mg Oral Q6H PRN Emokpae, Ejiroghene E, MD       Or   ondansetron  (ZOFRAN ) injection 4 mg  4 mg Intravenous Q6H PRN Emokpae, Ejiroghene E, MD       Oral care mouth rinse  15 mL Mouth Rinse PRN Tat, Alm, MD       oxyCODONE  (Oxy IR/ROXICODONE ) immediate release tablet 10 mg  10 mg Oral Q8H PRN Johnson, Clanford L, MD   10 mg at 03/11/23 0843   polyethylene glycol (MIRALAX  / GLYCOLAX ) packet 17 g  17 g Oral Daily PRN Emokpae, Ejiroghene E, MD       predniSONE  (DELTASONE ) tablet 50 mg  50 mg Oral Daily Emokpae, Ejiroghene E, MD   50 mg at 03/11/23 9158     Discharge Medications: Please see discharge summary for a list of discharge medications.  Relevant Imaging Results:  Relevant Lab Results:   Additional Information SSN: [id number]  Sharlyne Stabs, RN

## 2023-03-11 NOTE — Progress Notes (Signed)
 PROGRESS NOTE  Heidi Fuentes FMW:[id number] DOB: 19-Oct-1945 DOA: 03/06/2023 PCP: [name], DO  Brief History:  78 y.o. female with medical history significant for asthma, bipolar disorder, depression, hypertension, hypothyroidism, fibromyalgia.  Patient was brought to the ED reports of altered mental status, cough and difficulty breathing.  In the time of my evaluation, he is unable to answer questions, she is sedated, patient is status post 1 mg of Ativan , arouses to touch, speech is incomprehensible, and drifts back to sleep.  Per triage notes-EMS was called for above symptoms, they report patient stopped breathing at some point en route, per ED provider does not confirm this occurred, did not get this in signout from EMS. EMS was called out earlier today to patients home by patients nurse-  patient was awake and alert and refused transport at that time, but with change in mental status, patient was unable to talk or walk, EMS was called again-reports O2 sats of 86% with gurgling respiration.  She was placed on 2 L.   Recent hospitalization 1/22 to 1/26 for rectal prolapse, patient had XI robotic assisted rectopexy by Dr. Debby, Dr. Sheldon.   ED Course: Temperature 97.4.  Heart rate 66-113.  Respiratory rate mostly below 20, range- 15-41.  Blood pressure systolic 137-1 52.  O2 sats down to 87% on room air. WBC 9.1. COVID-positive. ABG with pH of 7.36, pCO2 of 46. Chest x-ray shows airway thickening, bronchitis or reactive airway disease. CT abdomen and pelvis with contrast-posterior perirectal hemorrhage status post rectopexy, with mass effect on the rectum.  Additional associated edema and fluid in the sigmoid mesocolon. EDP talked to Dr. Sebastian with general surgery,-okay to admit here, monitor, small enough that hemorrhage will resolve, no need for transfer.   Assessment/Plan: Acute metabolic encephalopathy --initially sedated status post Ativan , but she awakens to voice  but speech is incomprehensible.  -AMS likely secondary to-COVID infection, hypoxia.   -Patient's son reported patient has had several falls recently, combative with caregivers, on narcotic pain medications, and takes excessive amounts of gabapentin , noncompliant with psych meds.   -03/11/23--pt is A&O x 4 -2/1 ABG--7.39/46/135/27 -2/1 head CT:   No evidence of acute intracranial abnormality.  Moderate chronic small vessel ischemic disease -haloperidol  ordered as needed for agitation.   -PT eval = SNF -Wellbutrin , Prozac , lamotrigine  on home med list,  -hold prozac  for now while on paxlovid  per pharm D, ok to resume lamotrigine  and bupropion  -Follow-up UA-- no pyuria -Follow blood cultures- no growth to date  -B12--257 -folate 11.4 -TSH 0.695   Acute hypoxic respiratory failure  -O2 sats down to 87% on room air, on 2 L initially -due COVID virus bronchitis  -Chest x-ray suggesting bronchitis or reactive airway disease.  CT shows trace pleural fluid.  COVID-positive.  -continue bronchodilators, supplemental oxygen  -pulmonary toilet   COVID-19 virus infection Presenting with hypoxia, cough, dyspnea.  Chest x-ray suggestive of bronchitis or reactive airway disease. - given Solu-Medrol -60 mg x 1 -continue prednisone  (started 2/5) - Paxlovid  ordered  - initially trended inflammatory markers, but now trending down and stopped checking    Rectal prolapse Recent hospitalization 1/22 to 1/26 for rectal prolapse, patient had XI robotic assisted rectopexy by Dr. Debby, Dr. Sheldon. -CT abdomen and pelvis with contrast on admission 03/06/23 - Posterior perirectal hemorrhage status post rectopexy 02/24/2023, with mass effect on the rectum. Additional associated edema and fluid in the sigmoid mesocolon - EDP discussed with Dr. Sebastian  with general surgery and had her to review images, he recommended: -okay to admit to [facility], monitor, small enough that hemorrhage will resolve on its own, no need for  transfer to [facility]. -Hgb stable   Hypokalemia - oral replacement ordered, recheck Mg    Bipolar affective disorder, depressed, mild  Patient noncompliant with psych meds per multiple reports -Resumed home lamotrigine  and bupropion .   Holding fluoxetine  per pharm D while on paxlovid  -we've received reports that patient unable to care for self at home  -initially did not have capacity to make decision   Asthma, chronic -Albuterol  nebs as needed -Resume home regimen               Family Communication:   no Family at bedside   Consultants:  none   Code Status:  FULL   DVT Prophylaxis:   Betances Lovenox      Procedures: As Listed in Progress Note Above   Antibiotics: None         Subjective: Patient denies fevers, chills, headache, chest pain, dyspnea, nausea, vomiting, diarrhea, abdominal pain, dysuria, hematuria,    Objective: Vitals:   03/10/23 1354 03/10/23 1947 03/10/23 1953 03/11/23 0446  BP: 138/66  (!) 146/80 (!) 147/77  Pulse:  94 87 88  Resp:  19 15   Temp: 97.7 F (36.5 C)  98.7 F (37.1 C) 97.9 F (36.6 C)  TempSrc: Oral  Oral Oral  SpO2: 98% 93% 94% 93%  Weight:      Height:        Intake/Output Summary (Last 24 hours) at 03/11/2023 0804 Last data filed at 03/11/2023 9380 Gross per 24 hour  Intake 120 ml  Output --  Net 120 ml   Weight change:  Exam:  General:  Pt is alert, follows commands appropriately, not in acute distress HEENT: No icterus, No thrush, No neck mass, [place]/AT Cardiovascular: RRR, S1/S2, no rubs, no gallops Respiratory: bilateral rales.  No wheeze Abdomen: Soft/+BS, non tender, non distended, no guarding Extremities: No edema, No lymphangitis, No petechiae, No rashes, no synovitis   Data Reviewed: I have personally reviewed following labs and imaging studies Basic Metabolic Panel: Recent Labs  Lab 03/06/23 1641 03/07/23 0456 03/08/23 0538 03/09/23 0633 03/10/23 0444  NA 137 137 139 135 133*  K 4.1 3.7 3.2* 3.6 3.9   CL 100 100 103 100 99  CO2 26 26 23 24 23   GLUCOSE 83 138* 136* 126* 141*  BUN 17 17 19 23  29*  CREATININE 0.80 0.57 0.63 0.63 0.80  CALCIUM  9.1 8.8* 9.0 9.2 8.9  MG  --   --  2.4  --  2.6*   Liver Function Tests: Recent Labs  Lab 03/06/23 1641 03/08/23 0538 03/09/23 0633  AST 23 27 29   ALT 18 20 22   ALKPHOS 67 61 60  BILITOT 0.8 0.8 0.8  PROT 6.7 6.5 6.5  ALBUMIN 3.5 3.4* 3.6   No results for input(s): LIPASE, AMYLASE in the last 168 hours. No results for input(s): AMMONIA in the last 168 hours. Coagulation Profile: Recent Labs  Lab 03/06/23 1641  INR 1.0   CBC: Recent Labs  Lab 03/06/23 1641 03/07/23 0456 03/08/23 0538 03/10/23 0444  WBC 9.1 3.6* 7.2 5.9  NEUTROABS 4.0  --   --   --   HGB 10.3* 10.1* 10.4* 12.7  HCT 33.6* 31.3* 32.6* 40.4  MCV 102.4* 100.0 97.6 102.3*  PLT 315 290 337 285   Cardiac Enzymes: No results for input(s): CKTOTAL, CKMB,  CKMBINDEX, TROPONINI in the last 168 hours. BNP: Invalid input(s): POCBNP CBG: Recent Labs  Lab 03/07/23 0008  GLUCAP 140*   HbA1C: No results for input(s): HGBA1C in the last 72 hours. Urine analysis:    Component Value Date/Time   COLORURINE YELLOW 03/08/2023 0800   APPEARANCEUR HAZY (A) 03/08/2023 0800   APPEARANCEUR Cloudy 06/02/2012 1042   LABSPEC 1.023 03/08/2023 0800   LABSPEC 1.017 06/02/2012 1042   PHURINE 6.0 03/08/2023 0800   GLUCOSEU NEGATIVE 03/08/2023 0800   GLUCOSEU Negative 06/02/2012 1042   HGBUR NEGATIVE 03/08/2023 0800   BILIRUBINUR NEGATIVE 03/08/2023 0800   BILIRUBINUR Negative 06/02/2012 1042   KETONESUR 5 (A) 03/08/2023 0800   PROTEINUR 30 (A) 03/08/2023 0800   UROBILINOGEN 0.2 12/11/2014 1429   NITRITE NEGATIVE 03/08/2023 0800   LEUKOCYTESUR NEGATIVE 03/08/2023 0800   LEUKOCYTESUR 3+ 06/02/2012 1042   Sepsis Labs: @LABRCNTIP (procalcitonin:4,lacticidven:4) ) Recent Results (from the past 240 hours)  Blood Culture (routine x 2)     Status: None    Collection Time: 03/06/23  4:41 PM   Specimen: BLOOD  Result Value Ref Range Status   Specimen Description BLOOD BLOOD RIGHT ARM  Final   Special Requests NONE  Final   Culture   Final    NO GROWTH 5 DAYS Performed at [facility], [address], [place], [place] [address]    Report Status 03/11/2023 FINAL  Final  Blood Culture (routine x 2)     Status: None   Collection Time: 03/06/23  4:41 PM   Specimen: BLOOD  Result Value Ref Range Status   Specimen Description BLOOD  Final   Special Requests NONE  Final   Culture   Final    NO GROWTH 5 DAYS Performed at [facility], [address], [place], [place] [address]    Report Status 03/11/2023 FINAL  Final  Resp panel by RT-PCR (RSV, Flu A&B, Covid) Anterior Nasal Swab     Status: Abnormal   Collection Time: 03/06/23  4:42 PM   Specimen: Anterior Nasal Swab  Result Value Ref Range Status   SARS Coronavirus 2 by RT PCR POSITIVE (A) NEGATIVE Final    Comment: (NOTE) SARS-CoV-2 target nucleic acids are DETECTED.  The SARS-CoV-2 RNA is generally detectable in upper respiratory specimens during the acute phase of infection. Positive results are indicative of the presence of the identified virus, but do not rule out bacterial infection or co-infection with other pathogens not detected by the test. Clinical correlation with patient history and other diagnostic information is necessary to determine patient infection status. The expected result is Negative.  Fact Sheet for Patients: bloggercourse.com  Fact Sheet for Healthcare Providers: seriousbroker.it  This test is not yet approved or cleared by the [place]  FDA and  has been authorized for detection and/or diagnosis of SARS-CoV-2 by FDA under an Emergency Use Authorization (EUA).  This EUA will remain in effect (meaning this test can be used) for the duration of  the COVID-19 declaration under Section 564(b)(1) of the A ct,  21 U.S.C. section 360bbb-3(b)(1), unless the authorization is terminated or revoked sooner.     Influenza A by PCR NEGATIVE NEGATIVE Final   Influenza B by PCR NEGATIVE NEGATIVE Final    Comment: (NOTE) The Xpert Xpress SARS-CoV-2/FLU/RSV plus assay is intended as an aid in the diagnosis of influenza from Nasopharyngeal swab specimens and should not be used as a sole basis for treatment. Nasal washings and aspirates are unacceptable for Xpert Xpress SARS-CoV-2/FLU/RSV testing.  Fact Sheet for Patients: bloggercourse.com  Fact Sheet for Healthcare Providers: seriousbroker.it  This test is not yet approved or cleared by the [place]  FDA and has been authorized for detection and/or diagnosis of SARS-CoV-2 by FDA under an Emergency Use Authorization (EUA). This EUA will remain in effect (meaning this test can be used) for the duration of the COVID-19 declaration under Section 564(b)(1) of the Act, 21 U.S.C. section 360bbb-3(b)(1), unless the authorization is terminated or revoked.     Resp Syncytial Virus by PCR NEGATIVE NEGATIVE Final    Comment: (NOTE) Fact Sheet for Patients: bloggercourse.com  Fact Sheet for Healthcare Providers: seriousbroker.it  This test is not yet approved or cleared by the [place]  FDA and has been authorized for detection and/or diagnosis of SARS-CoV-2 by FDA under an Emergency Use Authorization (EUA). This EUA will remain in effect (meaning this test can be used) for the duration of the COVID-19 declaration under Section 564(b)(1) of the Act, 21 U.S.C. section 360bbb-3(b)(1), unless the authorization is terminated or revoked.  Performed at [facility], [address], [place], [place] [address]      Scheduled Meds:  buPROPion   75 mg Oral BID   enoxaparin  (LOVENOX ) injection  40 mg Subcutaneous Q24H   lamoTRIgine   200 mg Oral BID    latanoprost   1 drop Both Eyes QHS   levothyroxine   25 mcg Oral Q0600   melatonin  3 mg Oral QHS   metoprolol  tartrate  12.5 mg Oral BID   mometasone -formoterol   2 puff Inhalation BID   nirmatrelvir /ritonavir  (renal dosing)  2 tablet Oral BID   predniSONE   50 mg Oral Daily   Continuous Infusions:  Procedures/Studies: CT HEAD WO CONTRAST ( ) Result Date: 03/07/2023 CLINICAL DATA:  Altered mental status. Acute metabolic encephalopathy. Recent falls. EXAM: CT HEAD WITHOUT CONTRAST TECHNIQUE: Contiguous axial images were obtained from the base of the skull through the vertex without intravenous contrast. RADIATION DOSE REDUCTION: This exam was performed according to the departmental dose-optimization program which includes automated exposure control, adjustment of the mA and/or kV according to patient size and/or use of iterative reconstruction technique. COMPARISON:  Head CT 11/09/2022 FINDINGS: Brain: There is no evidence of an acute infarct, intracranial hemorrhage, mass, midline shift, or extra-axial fluid collection. Patchy cerebral white matter hypodensities are unchanged and nonspecific but compatible with moderate chronic small vessel ischemic disease. Mild cerebral atrophy is unchanged. Mild mineralization is again seen in the basal ganglia bilaterally. Vascular: Calcified atherosclerosis at the skull base. No hyperdense vessel. Skull: No acute fracture or suspicious osseous lesion. Sinuses/Orbits: Scattered mild mucosal thickening in the paranasal sinuses. Small volume fluid in the sphenoid sinuses. No significant mastoid fluid. Bilateral cataract extraction. Other: None. IMPRESSION: 1. No evidence of acute intracranial abnormality. 2. Moderate chronic small vessel ischemic disease. Electronically Signed   By: Dasie Hamburg M.D.   On: 03/07/2023 10:50   CT ABDOMEN PELVIS W CONTRAST Result Date: 03/06/2023 CLINICAL DATA:  Sepsis, recent bowel surgery. EXAM: CT ABDOMEN AND PELVIS WITH CONTRAST  TECHNIQUE: Multidetector CT imaging of the abdomen and pelvis was performed using the standard protocol following bolus administration of intravenous contrast. RADIATION DOSE REDUCTION: This exam was performed according to the departmental dose-optimization program which includes automated exposure control, adjustment of the mA and/or kV according to patient size and/or use of iterative reconstruction technique. CONTRAST:  OMNIPAQUE  IOHEXOL  300 MG/ML  SOLN COMPARISON:  Report of 11/09/2022 is reviewed. Images are not available at the time of dictation. FINDINGS: Lower  chest: Dependent atelectasis bilaterally. Atherosclerotic calcification of the aorta. Heart is enlarged. No pericardial effusion. There may be trace left pleural fluid. Hepatobiliary: Liver and gallbladder are unremarkable. No biliary ductal dilatation. Pancreas: Negative. Spleen: Negative. Adrenals/Urinary Tract: Adrenal glands are unremarkable. Subcentimeter low-attenuation lesions in the right kidney, too small to characterize. No specific follow-up necessary. Kidneys are otherwise unremarkable. Ureters are decompressed. Bladder is grossly unremarkable. Stomach/Bowel: Hiatal hernia repair. Stomach and small bowel are unremarkable. Appendix is not readily identified. Moderate stool burden. Patient is status post rectopexy. Posterior perirectal hyperattenuating fluid collection measures 5.0 x 8.3 cm and exerts mass effect on the rectum. Additional edema and fluid in the sigmoid mesocolon. Vascular/Lymphatic: Atherosclerotic calcification of the aorta. No pathologically enlarged lymph nodes. Reproductive: Uterus is visualized.  No adnexal mass. Other: Small bilateral inguinal hernias contain fat. Tiny umbilical hernia contains fat. No free fluid. Musculoskeletal: Degenerative changes in the spine. Grade 1 anterolisthesis of L4 on L5. Old L2 compression fracture. Old upper sacral fracture. Mild anterior wedging of T11, old. IMPRESSION: 1.  Posterior perirectal hemorrhage status post rectopexy 02/24/2023, with mass effect on the rectum. Additional associated edema and fluid in the sigmoid mesocolon. 2. Trace left pleural fluid. 3.  Aortic atherosclerosis (ICD10-I70.0). Electronically Signed   By: Newell Eke M.D.   On: 03/06/2023 19:17   DG Chest Port 1 View Result Date: 03/06/2023 CLINICAL DATA:  Shortness of breath, dyspnea.  Wheezing. EXAM: PORTABLE CHEST 1 VIEW COMPARISON:  07/15/2022 and chest CT from 11/09/2022 FINDINGS: Atherosclerotic calcification of the aortic arch. Mild enlargement of the cardiopericardial silhouette without edema. Airway thickening is present, suggesting bronchitis or reactive airways disease. Degenerative glenohumeral arthropathy bilaterally. No blunting of the costophrenic angles. IMPRESSION: 1. Airway thickening is present, suggesting bronchitis or reactive airways disease. 2. Mild enlargement of the cardiopericardial silhouette without edema. 3. Aortic Atherosclerosis (ICD10-I70.0). Electronically Signed   By: Ryan Salvage M.D.   On: 03/06/2023 17:02    Alm Schneider, DO  [facility]  If 7PM-7AM, please contact night-coverage [email or web address] Password [email or web address] 03/11/2023, 8:04 AM   LOS: 5 days

## 2023-03-11 NOTE — Progress Notes (Signed)
 Attempted report to [facility] in [place] x4. Number [phone] (attempted 1x) Number [phone] (attempted 3x)  No answer at either number.

## 2023-03-11 NOTE — Plan of Care (Signed)
  Problem: Education: Goal: Knowledge of risk factors and measures for prevention of condition will improve Outcome: Progressing   

## 2023-03-11 NOTE — TOC Transition Note (Signed)
 Transition of Care [facility]) - Discharge Note   Patient Details  Name: Heidi Fuentes MRN: [id number] Date of Birth: 01/29/1946  Transition of Care [facility]) CM/SW Contact:  Sharlyne Stabs, RN Phone Number: 03/11/2023, 3:12 PM   Clinical Narrative:   [facility] will not be able to admit patient until 2/11 due to COVID. RN confirmed with patient she is agreeable to other facilities. CM sent to [facility] a sister facility. Spoke with Annabella, nurse liaison. They have a COVID bed and can take her today. Patient is agreeable to [facility], she does not want to be in the hospital for 5 more days. Tiffany provided a room number, RN calling report, TOC printed med necessity. Clinicals will be sent in the hub.    Final next level of care: Skilled Nursing Facility Barriers to Discharge: Barriers Resolved   Patient Goals and CMS Choice Patient states their goals for this hospitalization and ongoing recovery are:: agreeable to SNF CMS Medicare.gov Compare Post Acute Care list provided to:: Patient Choice offered to / list presented to : Patient [place] ownership interest in [facility].provided to::  (n/a)   Discharge Placement            Patient to be transferred to facility by: EMS Name of family member notified: patient will update family Patient and family notified of of transfer: 03/11/23  Discharge Plan and Services Additional resources added to the After Visit Summary for   In-house Referral: Clinical Social Work               Social Drivers of Health (SDOH) Interventions SDOH Screenings   Food Insecurity: Patient Unable To Answer (03/07/2023)  Housing: Patient Unable To Answer (03/07/2023)  Transportation Needs: Patient Unable To Answer (03/07/2023)  Utilities: Patient Unable To Answer (03/07/2023)  Social Connections: Patient Unable To Answer (03/07/2023)  Recent Concern: Social Connections - Socially Isolated (02/24/2023)  Tobacco Use: Low Risk  (02/24/2023)    Readmission  Risk Interventions    03/07/2023    2:31 PM 11/10/2022   11:05 AM  Readmission Risk Prevention Plan  Transportation Screening Complete Complete  Medication Review Oceanographer) Complete Complete  PCP or Specialist appointment within 3-5 days of discharge  Not Complete  HRI or Home Care Consult Complete Complete  SW Recovery Care/Counseling Consult Complete Complete  Palliative Care Screening Not Applicable Not Applicable  Skilled Nursing Facility -- Not Complete

## 2023-03-11 NOTE — Plan of Care (Signed)
  Problem: Respiratory: Goal: Will maintain a patent airway Outcome: Progressing   Problem: Education: Goal: Knowledge of General Education information will improve Description: Including pain rating scale, medication(s)/side effects and non-pharmacologic comfort measures Outcome: Progressing   Problem: Health Behavior/Discharge Planning: Goal: Ability to manage health-related needs will improve Outcome: Progressing

## 2023-03-11 NOTE — TOC Progression Note (Signed)
 Transition of Care [facility]) - Progression Note    Patient Details  Name: Heidi Fuentes MRN: [id number] Date of Birth: 1945-07-01  Transition [facility] Care [facility] CM/SW Contact  Sharlyne Stabs, RN Phone Number: 03/11/2023, 1:00 PM  Clinical Narrative:   Everett Potters from [facility] left a message that they were closing the case. Per her interview last evening, patient has the mental capacity to make her own decisions. MD updated. Patient is now agreeable to SNF. She is requesting [facility] in [place]. FL2 sent, CM spoke with [facility], they will offer, but can not admit until 2/11. MD updated. TOC check with other facilities to see if anyone could take her sooner.     Expected Discharge Plan:  (unsure at this time) Barriers to Discharge: Continued Medical Work up  Expected Discharge Plan and Services In-house Referral: Clinical Social Work    Living arrangements for the past 2 months: Single Family Home                   Social Determinants of Health (SDOH) Interventions SDOH Screenings   Food Insecurity: Patient Unable To Answer (03/07/2023)  Housing: Patient Unable To Answer (03/07/2023)  Transportation Needs: Patient Unable To Answer (03/07/2023)  Utilities: Patient Unable To Answer (03/07/2023)  Social Connections: Patient Unable To Answer (03/07/2023)  Recent Concern: Social Connections - Socially Isolated (02/24/2023)  Tobacco Use: Low Risk  (02/24/2023)    Readmission Risk Interventions    03/07/2023    2:31 PM 11/10/2022   11:05 AM  Readmission Risk Prevention Plan  Transportation Screening Complete Complete  Medication Review Oceanographer) Complete Complete  PCP or Specialist appointment within 3-5 days of discharge  Not Complete  HRI or Home Care Consult Complete Complete  SW Recovery Care/Counseling Consult Complete Complete  Palliative Care Screening Not Applicable Not Applicable  Skilled Nursing Facility -- Not Complete

## 2023-07-28 ENCOUNTER — Telehealth: Payer: Self-pay | Admitting: Orthopedic Surgery

## 2023-07-28 NOTE — Telephone Encounter (Signed)
 Dickey,   Patient called wants to know if we do finger contractures.  Sari said to see if you can call her back at [phone] and schedule an appt at the [facility] office with Dr. Arlinda   Thank Rosine, Tobias

## 2023-07-29 NOTE — Telephone Encounter (Signed)
 I spoke with the patient and gave her the number to the [place] office to call.

## 2023-08-19 ENCOUNTER — Other Ambulatory Visit (HOSPITAL_COMMUNITY): Payer: Self-pay | Admitting: Family Medicine

## 2023-08-19 DIAGNOSIS — M8000XS Age-related osteoporosis with current pathological fracture, unspecified site, sequela: Secondary | ICD-10-CM

## 2023-08-19 DIAGNOSIS — M800B2S Age-related osteoporosis with current pathological fracture, left pelvis, sequela: Secondary | ICD-10-CM

## 2023-10-11 ENCOUNTER — Ambulatory Visit (HOSPITAL_COMMUNITY)
Admission: RE | Admit: 2023-10-11 | Discharge: 2023-10-11 | Disposition: A | Discharge status: Home / Self Care | Source: Ambulatory Visit | Attending: Family Medicine | Admitting: Family Medicine

## 2023-10-11 DIAGNOSIS — M8589 Other specified disorders of bone density and structure, multiple sites: Secondary | ICD-10-CM | POA: Insufficient documentation

## 2023-10-11 DIAGNOSIS — M8000XS Age-related osteoporosis with current pathological fracture, unspecified site, sequela: Secondary | ICD-10-CM | POA: Diagnosis present

## 2023-10-11 DIAGNOSIS — Z87310 Personal history of (healed) osteoporosis fracture: Secondary | ICD-10-CM | POA: Diagnosis not present

## 2023-10-11 DIAGNOSIS — Z13820 Encounter for screening for osteoporosis: Secondary | ICD-10-CM | POA: Diagnosis not present

## 2023-10-11 DIAGNOSIS — M800B2S Age-related osteoporosis with current pathological fracture, left pelvis, sequela: Secondary | ICD-10-CM | POA: Insufficient documentation

## 2023-10-25 ENCOUNTER — Observation Stay (HOSPITAL_COMMUNITY)

## 2023-10-25 ENCOUNTER — Other Ambulatory Visit: Payer: Self-pay

## 2023-10-25 ENCOUNTER — Emergency Department (HOSPITAL_COMMUNITY)

## 2023-10-25 ENCOUNTER — Inpatient Hospital Stay (HOSPITAL_COMMUNITY)
Admission: EM | Admit: 2023-10-25 | Discharge: 2023-11-26 | DRG: 917 | Attending: Family Medicine | Admitting: Family Medicine

## 2023-10-25 ENCOUNTER — Encounter (HOSPITAL_COMMUNITY): Payer: Self-pay

## 2023-10-25 DIAGNOSIS — Z7951 Long term (current) use of inhaled steroids: Secondary | ICD-10-CM

## 2023-10-25 DIAGNOSIS — M797 Fibromyalgia: Secondary | ICD-10-CM | POA: Diagnosis present

## 2023-10-25 DIAGNOSIS — R4189 Other symptoms and signs involving cognitive functions and awareness: Secondary | ICD-10-CM | POA: Diagnosis present

## 2023-10-25 DIAGNOSIS — R410 Disorientation, unspecified: Principal | ICD-10-CM

## 2023-10-25 DIAGNOSIS — G9341 Metabolic encephalopathy: Secondary | ICD-10-CM | POA: Diagnosis present

## 2023-10-25 DIAGNOSIS — F3131 Bipolar disorder, current episode depressed, mild: Secondary | ICD-10-CM | POA: Diagnosis present

## 2023-10-25 DIAGNOSIS — H9193 Unspecified hearing loss, bilateral: Secondary | ICD-10-CM | POA: Diagnosis present

## 2023-10-25 DIAGNOSIS — G9340 Encephalopathy, unspecified: Secondary | ICD-10-CM | POA: Diagnosis not present

## 2023-10-25 DIAGNOSIS — Z5329 Procedure and treatment not carried out because of patient's decision for other reasons: Secondary | ICD-10-CM | POA: Diagnosis not present

## 2023-10-25 DIAGNOSIS — G894 Chronic pain syndrome: Secondary | ICD-10-CM | POA: Diagnosis present

## 2023-10-25 DIAGNOSIS — T438X1A Poisoning by other psychotropic drugs, accidental (unintentional), initial encounter: Principal | ICD-10-CM | POA: Diagnosis present

## 2023-10-25 DIAGNOSIS — F3130 Bipolar disorder, current episode depressed, mild or moderate severity, unspecified: Secondary | ICD-10-CM | POA: Diagnosis present

## 2023-10-25 DIAGNOSIS — J4489 Other specified chronic obstructive pulmonary disease: Secondary | ICD-10-CM | POA: Diagnosis present

## 2023-10-25 DIAGNOSIS — Z961 Presence of intraocular lens: Secondary | ICD-10-CM | POA: Diagnosis present

## 2023-10-25 DIAGNOSIS — Z882 Allergy status to sulfonamides status: Secondary | ICD-10-CM

## 2023-10-25 DIAGNOSIS — X58XXXA Exposure to other specified factors, initial encounter: Secondary | ICD-10-CM | POA: Diagnosis present

## 2023-10-25 DIAGNOSIS — M545 Low back pain, unspecified: Secondary | ICD-10-CM | POA: Diagnosis present

## 2023-10-25 DIAGNOSIS — E039 Hypothyroidism, unspecified: Secondary | ICD-10-CM | POA: Diagnosis present

## 2023-10-25 DIAGNOSIS — K219 Gastro-esophageal reflux disease without esophagitis: Secondary | ICD-10-CM | POA: Diagnosis present

## 2023-10-25 DIAGNOSIS — Z8249 Family history of ischemic heart disease and other diseases of the circulatory system: Secondary | ICD-10-CM

## 2023-10-25 DIAGNOSIS — Z825 Family history of asthma and other chronic lower respiratory diseases: Secondary | ICD-10-CM

## 2023-10-25 DIAGNOSIS — Z6822 Body mass index (BMI) 22.0-22.9, adult: Secondary | ICD-10-CM

## 2023-10-25 DIAGNOSIS — E876 Hypokalemia: Secondary | ICD-10-CM | POA: Diagnosis present

## 2023-10-25 DIAGNOSIS — F1190 Opioid use, unspecified, uncomplicated: Secondary | ICD-10-CM | POA: Diagnosis present

## 2023-10-25 DIAGNOSIS — Z79899 Other long term (current) drug therapy: Secondary | ICD-10-CM

## 2023-10-25 DIAGNOSIS — G928 Other toxic encephalopathy: Secondary | ICD-10-CM | POA: Diagnosis present

## 2023-10-25 DIAGNOSIS — Z556 Problems related to health literacy: Secondary | ICD-10-CM

## 2023-10-25 DIAGNOSIS — Z885 Allergy status to narcotic agent status: Secondary | ICD-10-CM

## 2023-10-25 DIAGNOSIS — Z751 Person awaiting admission to adequate facility elsewhere: Secondary | ICD-10-CM

## 2023-10-25 DIAGNOSIS — N390 Urinary tract infection, site not specified: Secondary | ICD-10-CM | POA: Diagnosis not present

## 2023-10-25 DIAGNOSIS — Z7952 Long term (current) use of systemic steroids: Secondary | ICD-10-CM

## 2023-10-25 DIAGNOSIS — H353290 Exudative age-related macular degeneration, unspecified eye, stage unspecified: Secondary | ICD-10-CM | POA: Diagnosis present

## 2023-10-25 DIAGNOSIS — I10 Essential (primary) hypertension: Secondary | ICD-10-CM | POA: Diagnosis present

## 2023-10-25 DIAGNOSIS — Z79890 Hormone replacement therapy (postmenopausal): Secondary | ICD-10-CM

## 2023-10-25 DIAGNOSIS — F0390 Unspecified dementia without behavioral disturbance: Secondary | ICD-10-CM

## 2023-10-25 DIAGNOSIS — Z888 Allergy status to other drugs, medicaments and biological substances status: Secondary | ICD-10-CM

## 2023-10-25 DIAGNOSIS — F05 Delirium due to known physiological condition: Secondary | ICD-10-CM | POA: Diagnosis present

## 2023-10-25 DIAGNOSIS — E440 Moderate protein-calorie malnutrition: Secondary | ICD-10-CM | POA: Diagnosis present

## 2023-10-25 LAB — CBC WITH DIFFERENTIAL/PLATELET
Abs Immature Granulocytes: 0.03 K/uL (ref 0.00–0.07)
Basophils Absolute: 0 K/uL (ref 0.0–0.1)
Basophils Relative: 0 %
Eosinophils Absolute: 0.2 K/uL (ref 0.0–0.5)
Eosinophils Relative: 2 %
HCT: 43.2 % (ref 36.0–46.0)
Hemoglobin: 14.5 g/dL (ref 12.0–15.0)
Immature Granulocytes: 0 %
Lymphocytes Relative: 35 %
Lymphs Abs: 2.8 K/uL (ref 0.7–4.0)
MCH: 32.5 pg (ref 26.0–34.0)
MCHC: 33.6 g/dL (ref 30.0–36.0)
MCV: 96.9 fL (ref 80.0–100.0)
Monocytes Absolute: 1 K/uL (ref 0.1–1.0)
Monocytes Relative: 12 %
Neutro Abs: 4.1 K/uL (ref 1.7–7.7)
Neutrophils Relative %: 51 %
Platelets: 274 K/uL (ref 150–400)
RBC: 4.46 MIL/uL (ref 3.87–5.11)
RDW: 12.7 % (ref 11.5–15.5)
WBC: 8.1 K/uL (ref 4.0–10.5)
nRBC: 0 % (ref 0.0–0.2)

## 2023-10-25 LAB — URINALYSIS, ROUTINE W REFLEX MICROSCOPIC
Bacteria, UA: NONE SEEN
Bilirubin Urine: NEGATIVE
Glucose, UA: NEGATIVE mg/dL
Hgb urine dipstick: NEGATIVE
Ketones, ur: 20 mg/dL — AB
Leukocytes,Ua: NEGATIVE
Nitrite: NEGATIVE
Protein, ur: 30 mg/dL — AB
Specific Gravity, Urine: 1.027 (ref 1.005–1.030)
pH: 5 (ref 5.0–8.0)

## 2023-10-25 LAB — COMPREHENSIVE METABOLIC PANEL WITH GFR
ALT: 23 U/L (ref 0–44)
AST: 34 U/L (ref 15–41)
Albumin: 3.9 g/dL (ref 3.5–5.0)
Alkaline Phosphatase: 72 U/L (ref 38–126)
Anion gap: 15 (ref 5–15)
BUN: 25 mg/dL — ABNORMAL HIGH (ref 8–23)
CO2: 23 mmol/L (ref 22–32)
Calcium: 9.2 mg/dL (ref 8.9–10.3)
Chloride: 99 mmol/L (ref 98–111)
Creatinine, Ser: 0.7 mg/dL (ref 0.44–1.00)
GFR, Estimated: >60 mL/min (ref >60)
Glucose, Bld: 116 mg/dL — ABNORMAL HIGH (ref 70–99)
Potassium: 3.3 mmol/L — ABNORMAL LOW (ref 3.5–5.1)
Sodium: 137 mmol/L (ref 135–145)
Total Bilirubin: 1 mg/dL (ref 0.0–1.2)
Total Protein: 6.7 g/dL (ref 6.5–8.1)

## 2023-10-25 LAB — AMMONIA: Ammonia: 18 umol/L (ref 9–35)

## 2023-10-25 LAB — ETHANOL: Alcohol, Ethyl (B): <15 mg/dL (ref <15)

## 2023-10-25 MED ORDER — POLYETHYLENE GLYCOL 3350 17 G PO PACK: 17.0000 g | PACK | Freq: Every day | ORAL | Status: DC | PRN

## 2023-10-25 MED ORDER — POTASSIUM CHLORIDE IN NACL 20-0.9 MEQ/L-% IV SOLN: INTRAVENOUS | Status: AC

## 2023-10-25 MED ORDER — PANTOPRAZOLE SODIUM 20 MG PO TBEC
20.0000 mg | DELAYED_RELEASE_TABLET | Freq: Every day | ORAL | Status: DC
Start: 2023-10-26 — End: 2023-10-26
  Filled 2023-10-25: qty 1

## 2023-10-25 MED ORDER — ENOXAPARIN SODIUM 40 MG/0.4ML IJ SOSY
40.0000 mg | PREFILLED_SYRINGE | INTRAMUSCULAR | Status: DC
  Administered 2023-10-25 – 2023-11-25 (×30): 40 mg via SUBCUTANEOUS
  Filled 2023-10-25 (×32): qty 0.4

## 2023-10-25 MED ORDER — POTASSIUM CHLORIDE 20 MEQ PO PACK
40.0000 meq | PACK | Freq: Once | ORAL | Status: AC
  Administered 2023-10-25: 40 meq via ORAL
  Filled 2023-10-25: qty 2

## 2023-10-25 MED ORDER — ACETAMINOPHEN 650 MG RE SUPP: 650.0000 mg | Freq: Four times a day (QID) | RECTAL | Status: DC | PRN

## 2023-10-25 MED ORDER — ONDANSETRON HCL 4 MG PO TABS: 4.0000 mg | ORAL_TABLET | Freq: Four times a day (QID) | ORAL | Status: DC | PRN

## 2023-10-25 MED ORDER — ONDANSETRON HCL 4 MG/2ML IJ SOLN
4.0000 mg | Freq: Four times a day (QID) | INTRAMUSCULAR | Status: DC | PRN
  Administered 2023-11-20: 4 mg via INTRAVENOUS
  Filled 2023-10-25: qty 2

## 2023-10-25 MED ORDER — METOPROLOL TARTRATE 25 MG PO TABS
12.5000 mg | ORAL_TABLET | Freq: Two times a day (BID) | ORAL | Status: DC
Start: 2023-10-25 — End: 2023-11-25
  Administered 2023-10-25 – 2023-11-25 (×48): 12.5 mg via ORAL
  Filled 2023-10-25 (×63): qty 1

## 2023-10-25 MED ORDER — FLUTICASONE FUROATE-VILANTEROL 200-25 MCG/ACT IN AEPB
1.0000 | INHALATION_SPRAY | Freq: Every day | RESPIRATORY_TRACT | Status: DC
Start: 2023-10-26 — End: 2023-11-26
  Administered 2023-10-26 – 2023-11-26 (×26): 1 via RESPIRATORY_TRACT
  Filled 2023-10-25 (×2): qty 28

## 2023-10-25 MED ORDER — LEVOTHYROXINE SODIUM 25 MCG PO TABS
25.0000 ug | ORAL_TABLET | Freq: Every day | ORAL | Status: DC
  Administered 2023-10-28 – 2023-11-26 (×24): 25 ug via ORAL
  Filled 2023-10-25 (×27): qty 1

## 2023-10-25 MED ORDER — ACETAMINOPHEN 325 MG PO TABS
650.0000 mg | ORAL_TABLET | Freq: Four times a day (QID) | ORAL | Status: DC | PRN
  Administered 2023-10-28 – 2023-11-07 (×6): 650 mg via ORAL
  Filled 2023-10-25 (×9): qty 2

## 2023-10-25 NOTE — ED Provider Notes (Signed)
 [place] EMERGENCY DEPARTMENT AT [facility] Provider Note   CSN: [id number] Arrival date & time: 10/25/23  1447     Patient presents with: Altered Mental Status   Heidi Fuentes is a 78 y.o. female.    Altered Mental Status  This patient is a 78 year old female, she hasA history of medical problems including respiratory failure last admitted in February 2025, she had a robotic assisted rectopexy in January, she has a history of intermittent urinary infections and had actually been admitted to the hospital about a year ago with multifocal pneumonia, in June of last year because of sepsis from a urinary tract infection and cystitis as well.  She presents to the hospital today with altered mental status, the family members reports that the patient has been confused over the last day or 2.  This is coming from the paramedics as the patient is not able to give me much in the way of valuable information.  When I try to talk to the patient she is distractible, she gives me 1 or 2 word answers, she is not answering my questions directly very often and most of the time is saying words that do not pertain to the conversation although sometimes she does answer my questions.  When I ask where she lives she states the state?  The city?  [place] town[place]  [place]  She denies any pain nausea vomiting fevers chills, she does not seem to be bothered by anything at this time.  She was noted to be tachycardic by paramedics.    Prior to Admission medications   Medication Sig Start Date End Date Taking? Authorizing Provider  acetaminophen  (TYLENOL ) 500 MG tablet Take 500 mg by mouth every 6 (six) hours as needed for mild pain or moderate pain. For pain    [provider]  BREZTRI  AEROSPHERE 160-9-4.8 MCG/ACT AERO Inhale into the lungs. 12/04/22   [provider]  buPROPion  (WELLBUTRIN ) 75 MG tablet Take 1 tablet (75 mg total) by mouth 2 (two) times daily. 07/28/22   Ricky Fines, MD   FLUoxetine  (PROZAC ) 40 MG capsule Take 1 capsule (40 mg total) by mouth daily. Start on 03/13/23 03/13/23   Evonnie Lenis, MD  gabapentin  (NEURONTIN ) 300 MG capsule Take 600 mg by mouth 4 (four) times daily.    [provider]  lamoTRIgine  (LAMICTAL ) 200 MG tablet Take 200 mg by mouth 2 (two) times daily. 06/05/19   [provider]  latanoprost  (XALATAN ) 0.005 % ophthalmic solution 1 drop daily. 12/17/22   [provider]  levothyroxine  (SYNTHROID ) 25 MCG tablet Take 25 mcg by mouth daily before breakfast.    [provider]  Lidocaine -Hydrocortisone  Ace 3-0.5 % CREA Apply pea size amount to anorectum 4 times a day as needed 01/05/23   Rourk, Lamar HERO, MD  losartan  (COZAAR ) 25 MG tablet Take 1 tablet (25 mg total) by mouth daily. 08/14/20   Danford, Lonni SQUIBB, MD  melatonin 5 MG TABS Take 5 mg by mouth at bedtime. 11/17/17   [provider]  metoprolol  tartrate (LOPRESSOR ) 25 MG tablet Take 0.5 tablets (12.5 mg total) by mouth 2 (two) times daily. 03/11/23   Evonnie Lenis, MD  montelukast  (SINGULAIR ) 10 MG tablet Take 10 mg by mouth daily. 05/03/13   [provider]  Oxycodone  HCl 10 MG TABS Take 1 tablet (10 mg total) by mouth every 8 (eight) hours as needed. 03/11/23   Evonnie Lenis, MD  pantoprazole  (PROTONIX ) 20 MG tablet SMARTSIG:1.0  Tablet(s) By Mouth Daily 12/04/22   [provider]  polycarbophil (FIBERCON) 625 MG tablet Take 1 tablet (625 mg total) by mouth daily. 02/28/23   Thomas, Alicia, MD  polyethylene glycol (MIRALAX  / GLYCOLAX ) 17 g packet Take 17 g by mouth daily as needed for mild constipation. 03/11/23   Evonnie Lenis, MD  predniSONE  (DELTASONE ) 50 MG tablet Take 1 tablet (50 mg total) by mouth daily. X 3 days 03/12/23   Evonnie Lenis, MD  [facility] 160-4.5 MCG/ACT inhaler Inhale 2 puffs into the lungs in the morning and at bedtime. 05/25/22   [provider]  VENTOLIN  HFA 108 (90 Base) MCG/ACT inhaler SMARTSIG:1 Puff(s) By Mouth Every 4  Hours PRN 12/08/22   [provider]    Allergies: Sulfa antibiotics, Mirtazapine, Trazodone, Trazodone hcl, Ziprasidone, Aripiprazole, Quetiapine , Trazodone and nefazodone, and Ziprasidone hcl    Review of Systems  All other systems reviewed and are negative.   Updated Vital Signs BP 135/75   Pulse (!) 118   Temp 98.8 F (37.1 C) (Rectal)   Resp 14   Ht 1.499 m (4' 11)   Wt 52 kg   SpO2 96%   BMI 23.15 kg/m   Physical Exam Vitals and nursing note reviewed.  Constitutional:      General: She is not in acute distress.    Appearance: She is well-developed.  HENT:     Head: Normocephalic and atraumatic.     Mouth/Throat:     Pharynx: No oropharyngeal exudate.  Eyes:     General: No scleral icterus.       Right eye: No discharge.        Left eye: No discharge.     Conjunctiva/sclera: Conjunctivae normal.     Pupils: Pupils are equal, round, and reactive to light.  Neck:     Thyroid : No thyromegaly.     Vascular: No JVD.  Cardiovascular:     Rate and Rhythm: Regular rhythm. Tachycardia present.     Heart sounds: Normal heart sounds. No murmur heard.    No friction rub. No gallop.  Pulmonary:     Effort: Pulmonary effort is normal. No respiratory distress.     Breath sounds: Normal breath sounds. No wheezing or rales.  Abdominal:     General: Bowel sounds are normal. There is no distension.     Palpations: Abdomen is soft. There is no mass.     Tenderness: There is no abdominal tenderness.  Musculoskeletal:        General: No tenderness. Normal range of motion.     Cervical back: Normal range of motion and neck supple.     Right lower leg: No edema.     Left lower leg: No edema.  Lymphadenopathy:     Cervical: No cervical adenopathy.  Skin:    General: Skin is warm and dry.     Findings: No erythema or rash.  Neurological:     Mental Status: She is alert.     Coordination: Coordination normal.     Comments: The patient's neurologic exam is reassuring,  she has no facial droop, she is able to speak in clear words although her answers do not always match my questions, she seems to have a decreased attention span but is able to move all 4 extremities to command including grips and raising both legs with symmetrical strength.  There is no facial droop, no ptosis, normal pupils, normal coordination.  Psychiatric:        Behavior:  Behavior normal.     (all labs ordered are listed, but only abnormal results are displayed) Labs Reviewed  COMPREHENSIVE METABOLIC PANEL WITH GFR - Abnormal; Notable for the following components:      Result Value   Potassium 3.3 (*)    Glucose, Bld 116 (*)    BUN 25 (*)    All other components within normal limits  URINALYSIS, ROUTINE W REFLEX MICROSCOPIC - Abnormal; Notable for the following components:   Ketones, ur 20 (*)    Protein, ur 30 (*)    All other components within normal limits  CBC WITH DIFFERENTIAL/PLATELET  AMMONIA  ETHANOL  CBG MONITORING, ED    EKG: None  Radiology: CT HEAD WO CONTRAST Result Date: 10/25/2023 CLINICAL DATA:  Altered mental status.  Chronic UTIs. EXAM: CT HEAD WITHOUT CONTRAST TECHNIQUE: Contiguous axial images were obtained from the base of the skull through the vertex without intravenous contrast. RADIATION DOSE REDUCTION: This exam was performed according to the departmental dose-optimization program which includes automated exposure control, adjustment of the mA and/or kV according to patient size and/or use of iterative reconstruction technique. COMPARISON:  03/07/2023 FINDINGS: Brain: Ventricles, cisterns and other CSF spaces are normal. Mild chronic ischemic microvascular disease is present. No mass, mass effect, shift of midline structures or acute hemorrhage. No acute infarction. Vascular: No hyperdense vessel or unexpected calcification. Skull: Normal. Negative for fracture or focal lesion. Sinuses/Orbits: No acute finding. Other: None. IMPRESSION: 1. No acute findings.  2. Mild chronic ischemic microvascular disease. Electronically Signed   By: Toribio Agreste M.D.   On: 10/25/2023 16:29   DG Chest Port 1 View Result Date: 10/25/2023 CLINICAL DATA:  Altered mental status, back pain. EXAM: PORTABLE CHEST 1 VIEW COMPARISON:  03/06/2023. FINDINGS: Trachea is midline. Heart size within normal limits. Thoracic aorta is calcified. Lungs are clear. No pleural fluid. IMPRESSION: No acute findings. Electronically Signed   By: Newell Eke M.D.   On: 10/25/2023 16:10     Procedures   Medications Ordered in the ED - No data to display                                  Medical Decision Making Amount and/or Complexity of Data Reviewed Labs: ordered. Radiology: ordered.  Risk Decision regarding hospitalization.    This patient presents to the ED for concern of delirium, this involves an extensive number of treatment options, and is a complaint that carries with it a high risk of complications and morbidity.  The differential diagnosis includes infection, stroke, medication compliance, hepatic encephalopathy, seizure   Co morbidities / Chronic conditions that complicate the patient evaluation  Bipolar disorder   Additional history obtained:  Additional history obtained from EMR External records from outside source obtained and reviewed including medical record as well as once the patient's friends who I was able to call on the phone, she does not know what is going on with the patient and states that she thinks that it is probably either an infection or maybe she is not taking her medications but she is completely unsure and no one knows if she is taking her medications. I tried to call other family members on her contact list and nobody answers the phone and there is no phone number for the caregiver that sent her in today   Lab Tests:  I Ordered, and personally interpreted labs.  The pertinent results include: CBC metabolic  panel urinalysis  unremarkable   Imaging Studies ordered:  I ordered imaging studies including CT scan of the brain I independently visualized and interpreted imaging which showed no acute stroke I agree with the radiologist interpretation   Cardiac Monitoring: / EKG:  The patient was maintained on a cardiac monitor.  I personally viewed and interpreted the cardiac monitored which showed an underlying rhythm of: Borderline sinus tachycardia   Problem List / ED Course / Critical interventions / Medication management  The patient has a delirium unclear etiology and will need to be admitted to the hospital  I have reviewed the patients home medicines and have made adjustments as needed   Consultations Obtained:  I requested consultation with the hospitalist Dr. Pearlean,  and discussed lab and imaging findings as well as pertinent plan - they recommend: They will admit to the hospital   Social Determinants of Health:  Altered   Test / Admission - Considered:  Admit      Final diagnoses:  Delirium    ED Discharge Orders     None          Cleotilde Rogue, MD 10/25/23 1729

## 2023-10-25 NOTE — H&P (Signed)
 History and Physical    Heidi Fuentes FMW:[id number] DOB: April 24, 1945 DOA: 10/25/2023  PCP: [name], DO   Patient coming from: Home  I have personally briefly reviewed patient's old medical records in [facility]  Chief Complaint: AMS  HPI: Heidi Fuentes is a 78 y.o. female with medical history significant for asthma, bipolar disorder, hypertension, chronic pain, and bronchiectasis. Patient was brought to the ED via EMS for reports of altered mental status.  Patient is alone but has a caregiver, she called patient's son the patient was not her normal self, and he said to bring patient to the ED.  The time of my evaluation, patient is awake and alert and oriented to person, but not time, she is unable to tell me why she is here. She appears to answer other questions appropriately.  She denies difficulty breathing, no cough, no vomiting or diarrhea, no weakness of her extremities, endorses good oral intake.  She was hospitalized back in February- 2/1 to 2/6 also for altered mental status thought secondary to COVID infection and  hypoxia.  ED Course: Temperature 98.4.  Heart rate 92-118, respiratory rate 14-26.  Blood pressure systolic 131-156.  UA not suggestive of UTI.  Ammonia 18.  Potassium 3.3.  Head CT negative for acute abnormality.  Portable chest x-ray unremarkable. MRI ordered and pending  Review of Systems: As per HPI all other systems reviewed and negative.  Past Medical History:  Diagnosis Date   Arthritis    Asthma    Back pain    Bipolar disorder (HCC)    Chronic pain syndrome 07/10/2013   Depression    Duodenal papillary stenosis    Dyspnea    Esophageal dysmotility    Fibromyalgia    GERD (gastroesophageal reflux disease)    Glaucoma    H/O wheezing    History of hiatal hernia    History of palpitations    HOH (hard of hearing)    Hypertension    Hypothyroidism    Orthopnea    Pneumonia    Stenosis, spinal, lumbar    Thyroid  disease     Tubular adenoma    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[facility])     Past Surgical History:  Procedure Laterality Date   CATARACT EXTRACTION W/PHACO Right 10/06/2016   Procedure: CATARACT EXTRACTION PHACO AND INTRAOCULAR LENS PLACEMENT (IOC);  Surgeon: Jaye Fallow, MD;  Location: [facility] ORS;  Service: Ophthalmology;  Laterality: Right;  US  00:32.5 AP% 14.5 CDE 4.71 Fluid pack lot # [id number]   CATARACT EXTRACTION W/PHACO Left 11/03/2016   Procedure: CATARACT EXTRACTION PHACO AND INTRAOCULAR LENS PLACEMENT (IOC);  Surgeon: Jaye Fallow, MD;  Location: [facility] ORS;  Service: Ophthalmology;  Laterality: Left;  US  00:36.6 AP% 16.0 CDE 5.86 Fluid Pack lot # [id number]   COCCYX REMOVAL     colonoscopy  2005   Dr. Golda: mild melanosis coli, otherwise normal   COLONOSCOPY WITH PROPOFOL  N/A 09/14/2013   MFM:Fzojwndpd coli. Colonic diverticulosis. Single colonic. Tubular adenoma. Next TCS 09/2020.   COLONOSCOPY WITH PROPOFOL  N/A 07/24/2021   Procedure: COLONOSCOPY WITH PROPOFOL ;  Surgeon: Shaaron Lamar HERO, MD;  Location: [facility];  Service: Endoscopy;  Laterality: N/A;  2:00pm   ERCP  1997   [facility]: biliary manometry abnormal, subsequent sphincterotomy    ESOPHAGEAL MANOMETRY N/A 12/21/2016   Surgeon: Shila Gustav GAILS, MD; EG junction outflow obstruction which could be secondary to large hiatal hernia and possible coiling of catheter.  Not consistent with achalasia or other  variant.  No major peristaltic motility disorder.   ESOPHAGOGASTRODUODENOSCOPY (EGD) WITH PROPOFOL  N/A 09/14/2013   MFM:Dryjusxp'd ring. Hiatal hernia. Status post Agapito and biopsy disruption.    FOOT SURGERY Bilateral    Bunionectomy and hammer toes   HEMORROIDECTOMY     HERNIA REPAIR     inguinal right   LAPAROSCOPIC PARAESOPHAGEAL HERNIA REPAIR  02/2017   Dr. Adelia [facility])   OREGON DILATION N/A 09/14/2013   Procedure: AGAPITO HODGKIN;  Surgeon: Lamar CHRISTELLA Hollingshead, MD;  Location: [facility];  Service:  Endoscopy;  Laterality: N/A;  56   POLYPECTOMY N/A 09/14/2013   Procedure: POLYPECTOMY;  Surgeon: Lamar CHRISTELLA Hollingshead, MD;  Location: [facility];  Service: Endoscopy;  Laterality: N/A;   POLYPECTOMY  07/24/2021   Procedure: POLYPECTOMY;  Surgeon: Hollingshead Lamar CHRISTELLA, MD;  Location: [facility];  Service: Endoscopy;;   TONSILLECTOMY     XI ROBOT ASSISTED RECTOPEXY N/A 02/24/2023   Procedure: XI ROBOT ASSISTED RECTOPEXY;  Surgeon: Debby Hila, MD;  Location: [facility] OR[facility];  Service: General;  Laterality: N/A;     reports that she has never smoked. She has never used smokeless tobacco. She reports that she does not drink alcohol and does not use drugs.  Allergies  Allergen Reactions   Sulfa Antibiotics Rash and Hives   Mirtazapine Other (See Comments)    HA's   Trazodone Other (See Comments)   Trazodone Hcl Other (See Comments)    strange dreams   Ziprasidone Other (See Comments)    cardiac issues   Aripiprazole Other (See Comments)    Bad dreams   Quetiapine  Palpitations   Trazodone And Nefazodone Other (See Comments)    unknown   Ziprasidone Hcl Other (See Comments)    hospitalization specifics    Family History  Problem Relation Age of Onset   Lung cancer Mother    Heart attack Father    Bone cancer Brother    COPD Sister    Colon cancer Neg Hx    Prior to Admission medications   Medication Sig Start Date End Date Taking? Authorizing Provider  acetaminophen  (TYLENOL ) 500 MG tablet Take 500 mg by mouth every 6 (six) hours as needed for mild pain or moderate pain. For pain    [provider]  BREZTRI  AEROSPHERE 160-9-4.8 MCG/ACT AERO Inhale into the lungs. 12/04/22   [provider]  buPROPion  (WELLBUTRIN ) 75 MG tablet Take 1 tablet (75 mg total) by mouth 2 (two) times daily. 07/28/22   Ricky Fines, MD  FLUoxetine  (PROZAC ) 40 MG capsule Take 1 capsule (40 mg total) by mouth daily. Start on 03/13/23 03/13/23   Evonnie Lenis, MD  gabapentin  (NEURONTIN ) 300 MG capsule Take 600 mg  by mouth 4 (four) times daily.    [provider]  lamoTRIgine  (LAMICTAL ) 200 MG tablet Take 200 mg by mouth 2 (two) times daily. 06/05/19   [provider]  latanoprost  (XALATAN ) 0.005 % ophthalmic solution 1 drop daily. 12/17/22   [provider]  levothyroxine  (SYNTHROID ) 25 MCG tablet Take 25 mcg by mouth daily before breakfast.    [provider]  Lidocaine -Hydrocortisone  Ace 3-0.5 % CREA Apply pea size amount to anorectum 4 times a day as needed 01/05/23   Rourk, Lamar CHRISTELLA, MD  losartan  (COZAAR ) 25 MG tablet Take 1 tablet (25 mg total) by mouth daily. 08/14/20   Danford, Lonni SQUIBB, MD  melatonin 5 MG TABS Take 5 mg by mouth at bedtime. 11/17/17   [provider]  metoprolol  tartrate (LOPRESSOR ) 25 MG tablet Take 0.5 tablets (12.5 mg total) by mouth 2 (two) times daily. 03/11/23   Evonnie Lenis, MD  montelukast  (SINGULAIR ) 10 MG tablet Take 10 mg by mouth daily. 05/03/13   [provider]  Oxycodone  HCl 10 MG TABS Take 1 tablet (10 mg total) by mouth every 8 (eight) hours as needed. 03/11/23   Evonnie Lenis, MD  pantoprazole  (PROTONIX ) 20 MG tablet SMARTSIG:1.0 Tablet(s) By Mouth Daily 12/04/22   [provider]  polycarbophil (FIBERCON) 625 MG tablet Take 1 tablet (625 mg total) by mouth daily. 02/28/23   Thomas, Alicia, MD  polyethylene glycol (MIRALAX  / GLYCOLAX ) 17 g packet Take 17 g by mouth daily as needed for mild constipation. 03/11/23   Evonnie Lenis, MD  predniSONE  (DELTASONE ) 50 MG tablet Take 1 tablet (50 mg total) by mouth daily. X 3 days 03/12/23   Evonnie Lenis, MD  [facility] 160-4.5 MCG/ACT inhaler Inhale 2 puffs into the lungs in the morning and at bedtime. 05/25/22   [provider]  VENTOLIN  HFA 108 (90 Base) MCG/ACT inhaler SMARTSIG:1 Puff(s) By Mouth Every 4 Hours PRN 12/08/22   [provider]    Physical Exam: Vitals:   10/25/23 1600 10/25/23 1630 10/25/23 1700 10/25/23 1730  BP: (!) 152/73 (!) 156/74 135/75 139/79   Pulse:      Resp: 16 14 14  (!) 26  Temp:      TempSrc:      SpO2:      Weight:      Height:        Constitutional: NAD, calm, comfortable Vitals:   10/25/23 1600 10/25/23 1630 10/25/23 1700 10/25/23 1730  BP: (!) 152/73 (!) 156/74 135/75 139/79  Pulse:      Resp: 16 14 14  (!) 26  Temp:      TempSrc:      SpO2:      Weight:      Height:       Eyes: PERRL, lids and conjunctivae normal ENMT: Mucous membranes are moist.  Neck: normal, supple, no masses, no thyromegaly Respiratory: clear to auscultation bilaterally, no wheezing, no crackles. Normal respiratory effort. No accessory muscle use.  Cardiovascular: Regular rate and rhythm, no murmurs / rubs / gallops. No extremity edema.   Abdomen: no tenderness, no masses palpated. No hepatosplenomegaly. Bowel sounds positive.  Musculoskeletal: no clubbing / cyanosis. No joint deformity upper and lower extremities.  Skin: no rashes, lesions, ulcers. No induration Neurologic: No facial asymmetry, 5 of 5 strength bilateral upper and lower extremities, speech fluent Psychiatric: Awake and alert oriented to person.  Not oriented to situation or time.  Able to answer questions appropriately.    Labs on Admission: I have personally reviewed following labs and imaging studies  CBC: Recent Labs  Lab 10/25/23 1513  WBC 8.1  NEUTROABS 4.1  HGB 14.5  HCT 43.2  MCV 96.9  PLT 274   Basic Metabolic Panel: Recent Labs  Lab 10/25/23 1513  NA 137  K 3.3*  CL 99  CO2 23  GLUCOSE 116*  BUN 25*  CREATININE 0.70  CALCIUM  9.2   GFR: Estimated Creatinine Clearance: 42.7 mL/min (by C-G formula based on SCr of 0.7 mg/dL). Liver Function Tests: Recent Labs  Lab 10/25/23 1513  AST 34  ALT 23  ALKPHOS 72  BILITOT 1.0  PROT 6.7  ALBUMIN 3.9   No results for input(s): LIPASE, AMYLASE in the last 168 hours. Recent Labs  Lab 10/25/23 1513  AMMONIA  18   Urine analysis:    Component Value Date/Time   COLORURINE YELLOW  10/25/2023 1521   APPEARANCEUR CLEAR 10/25/2023 1521   APPEARANCEUR Cloudy 06/02/2012 1042   LABSPEC 1.027 10/25/2023 1521   LABSPEC 1.017 06/02/2012 1042   PHURINE 5.0 10/25/2023 1521   GLUCOSEU NEGATIVE 10/25/2023 1521   GLUCOSEU Negative 06/02/2012 1042   HGBUR NEGATIVE 10/25/2023 1521   BILIRUBINUR NEGATIVE 10/25/2023 1521   BILIRUBINUR Negative 06/02/2012 1042   KETONESUR 20 (A) 10/25/2023 1521   PROTEINUR 30 (A) 10/25/2023 1521   UROBILINOGEN 0.2 12/11/2014 1429   NITRITE NEGATIVE 10/25/2023 1521   LEUKOCYTESUR NEGATIVE 10/25/2023 1521   LEUKOCYTESUR 3+ 06/02/2012 1042    Radiological Exams on Admission: CT HEAD WO CONTRAST Result Date: 10/25/2023 CLINICAL DATA:  Altered mental status.  Chronic UTIs. EXAM: CT HEAD WITHOUT CONTRAST TECHNIQUE: Contiguous axial images were obtained from the base of the skull through the vertex without intravenous contrast. RADIATION DOSE REDUCTION: This exam was performed according to the departmental dose-optimization program which includes automated exposure control, adjustment of the mA and/or kV according to patient size and/or use of iterative reconstruction technique. COMPARISON:  03/07/2023 FINDINGS: Brain: Ventricles, cisterns and other CSF spaces are normal. Mild chronic ischemic microvascular disease is present. No mass, mass effect, shift of midline structures or acute hemorrhage. No acute infarction. Vascular: No hyperdense vessel or unexpected calcification. Skull: Normal. Negative for fracture or focal lesion. Sinuses/Orbits: No acute finding. Other: None. IMPRESSION: 1. No acute findings. 2. Mild chronic ischemic microvascular disease. Electronically Signed   By: Toribio Agreste M.D.   On: 10/25/2023 16:29   DG Chest Port 1 View Result Date: 10/25/2023 CLINICAL DATA:  Altered mental status, back pain. EXAM: PORTABLE CHEST 1 VIEW COMPARISON:  03/06/2023. FINDINGS: Trachea is midline. Heart size within normal limits. Thoracic aorta is calcified.  Lungs are clear. No pleural fluid. IMPRESSION: No acute findings. Electronically Signed   By: Newell Eke M.D.   On: 10/25/2023 16:10   EKG: None   Assessment/Plan Principal Problem:   Acute encephalopathy Active Problems:   Bipolar affective disorder, depressed, mild (HCC)   Acquired hypothyroidism   Chronic narcotic use  Assessment and Plan: No notes have been filed under this hospital service. Service: Hospitalist  Acute encephalopathy- type or exact etiology as yet unspecified. ? Delirium/sundowning versus medication-induced.  CT negative for acute abnormality.  MRI brain pending.  No focal neurologic deficit.  UA not suggestive of UTI, chest x-ray clear.  On exam she is awake and alert oriented to person and place but not time or situation, able to answer other questions appropriately.  No overt signs of confusion..  Patient lives alone, caregiver not listed, family does not have to contact for caregiver to confirm patient's change in mental status. - N/S+ 40kcl 75cc/hr x 5hrs - Follow-up MRI brain - Per med list, patient is on several psychoactive medications, pharmacy tried to review patient's medication, patient stopped taking Lamictal  by herself and unsure of what else patient is or is not taking. - Hold other psychoactive medications for now  Hypothyroidism resume Synthroid   Chronic pain, per med rec patient is no longer taking oxycodone   Hypertension-systolic 130s to 849d. - Resume metoprolol ,  - Resume losartan  pending med rec   DVT prophylaxis: Lovenox  Code Status: FULL Family Communication: None at bedside Disposition Plan: ~ 1- 2 days Consults called: None Admission status:  Obs tele    Author: Tully FORBES Carwin, MD 10/25/2023 7:41 PM  For on call  review [email or web address].

## 2023-10-25 NOTE — Progress Notes (Signed)
 Pt alert & oriented to self and place only. When asked questions, pt refuses to answer or gives answer that has nothing to do with the question asked. She keeps pointing to the white board on the wall and reciting the alphabet over and over. She was given a supper meal but did not eat, kept sticking her fingers in the food and stated, Yes, I'm putting my finger in my food! You know why I'm doing it don't you? Laughing inappropriately, wandering conversation.  Attempted to call both of patient's sons to complete admission questions as pt is too confused to answer them. No answer at son Federico number, message left at son Phillip's number with request for family to call here tomorrow am to give info to day shift nurse.  Pt currently resting in bed, call bell within reach, side rails up x3 and bed in low position and alarm on for safety.

## 2023-10-25 NOTE — ED Triage Notes (Signed)
 Pt BIB EMS for AMS. Per EMS pt has chronic UTIs. Pt has a caregiver who called the son who wanted her sent. Pt alter to self and family. Pt unable to tell me month or where she is at. Pt has also complained of lower back pain.

## 2023-10-26 DIAGNOSIS — G928 Other toxic encephalopathy: Secondary | ICD-10-CM | POA: Diagnosis present

## 2023-10-26 DIAGNOSIS — X58XXXA Exposure to other specified factors, initial encounter: Secondary | ICD-10-CM | POA: Diagnosis present

## 2023-10-26 DIAGNOSIS — Z6822 Body mass index (BMI) 22.0-22.9, adult: Secondary | ICD-10-CM | POA: Diagnosis not present

## 2023-10-26 DIAGNOSIS — Z79899 Other long term (current) drug therapy: Secondary | ICD-10-CM | POA: Diagnosis not present

## 2023-10-26 DIAGNOSIS — Z7951 Long term (current) use of inhaled steroids: Secondary | ICD-10-CM | POA: Diagnosis not present

## 2023-10-26 DIAGNOSIS — F1190 Opioid use, unspecified, uncomplicated: Secondary | ICD-10-CM | POA: Diagnosis not present

## 2023-10-26 DIAGNOSIS — K219 Gastro-esophageal reflux disease without esophagitis: Secondary | ICD-10-CM | POA: Diagnosis present

## 2023-10-26 DIAGNOSIS — N390 Urinary tract infection, site not specified: Secondary | ICD-10-CM | POA: Diagnosis not present

## 2023-10-26 DIAGNOSIS — F05 Delirium due to known physiological condition: Secondary | ICD-10-CM | POA: Diagnosis present

## 2023-10-26 DIAGNOSIS — E876 Hypokalemia: Secondary | ICD-10-CM | POA: Diagnosis present

## 2023-10-26 DIAGNOSIS — R4189 Other symptoms and signs involving cognitive functions and awareness: Secondary | ICD-10-CM | POA: Diagnosis present

## 2023-10-26 DIAGNOSIS — F03918 Unspecified dementia, unspecified severity, with other behavioral disturbance: Secondary | ICD-10-CM | POA: Diagnosis not present

## 2023-10-26 DIAGNOSIS — E038 Other specified hypothyroidism: Secondary | ICD-10-CM | POA: Diagnosis not present

## 2023-10-26 DIAGNOSIS — Z8659 Personal history of other mental and behavioral disorders: Secondary | ICD-10-CM | POA: Diagnosis not present

## 2023-10-26 DIAGNOSIS — F3130 Bipolar disorder, current episode depressed, mild or moderate severity, unspecified: Secondary | ICD-10-CM | POA: Diagnosis present

## 2023-10-26 DIAGNOSIS — G894 Chronic pain syndrome: Secondary | ICD-10-CM | POA: Diagnosis present

## 2023-10-26 DIAGNOSIS — Z556 Problems related to health literacy: Secondary | ICD-10-CM | POA: Diagnosis not present

## 2023-10-26 DIAGNOSIS — I10 Essential (primary) hypertension: Secondary | ICD-10-CM | POA: Diagnosis present

## 2023-10-26 DIAGNOSIS — R410 Disorientation, unspecified: Secondary | ICD-10-CM | POA: Diagnosis present

## 2023-10-26 DIAGNOSIS — G9341 Metabolic encephalopathy: Secondary | ICD-10-CM | POA: Diagnosis not present

## 2023-10-26 DIAGNOSIS — H9193 Unspecified hearing loss, bilateral: Secondary | ICD-10-CM | POA: Diagnosis present

## 2023-10-26 DIAGNOSIS — Z79890 Hormone replacement therapy (postmenopausal): Secondary | ICD-10-CM | POA: Diagnosis not present

## 2023-10-26 DIAGNOSIS — F3131 Bipolar disorder, current episode depressed, mild: Secondary | ICD-10-CM | POA: Diagnosis not present

## 2023-10-26 DIAGNOSIS — T438X1A Poisoning by other psychotropic drugs, accidental (unintentional), initial encounter: Secondary | ICD-10-CM | POA: Diagnosis present

## 2023-10-26 DIAGNOSIS — M545 Low back pain, unspecified: Secondary | ICD-10-CM | POA: Diagnosis present

## 2023-10-26 DIAGNOSIS — M797 Fibromyalgia: Secondary | ICD-10-CM | POA: Diagnosis present

## 2023-10-26 DIAGNOSIS — E440 Moderate protein-calorie malnutrition: Secondary | ICD-10-CM | POA: Diagnosis present

## 2023-10-26 DIAGNOSIS — F0390 Unspecified dementia without behavioral disturbance: Secondary | ICD-10-CM | POA: Diagnosis not present

## 2023-10-26 DIAGNOSIS — G9340 Encephalopathy, unspecified: Secondary | ICD-10-CM | POA: Diagnosis not present

## 2023-10-26 DIAGNOSIS — Z7952 Long term (current) use of systemic steroids: Secondary | ICD-10-CM | POA: Diagnosis not present

## 2023-10-26 DIAGNOSIS — E039 Hypothyroidism, unspecified: Secondary | ICD-10-CM | POA: Diagnosis present

## 2023-10-26 DIAGNOSIS — H353290 Exudative age-related macular degeneration, unspecified eye, stage unspecified: Secondary | ICD-10-CM | POA: Diagnosis present

## 2023-10-26 DIAGNOSIS — Z882 Allergy status to sulfonamides status: Secondary | ICD-10-CM | POA: Diagnosis not present

## 2023-10-26 DIAGNOSIS — J4489 Other specified chronic obstructive pulmonary disease: Secondary | ICD-10-CM | POA: Diagnosis present

## 2023-10-26 DIAGNOSIS — Z8249 Family history of ischemic heart disease and other diseases of the circulatory system: Secondary | ICD-10-CM | POA: Diagnosis not present

## 2023-10-26 LAB — BASIC METABOLIC PANEL WITH GFR
Anion gap: 11 (ref 5–15)
BUN: 23 mg/dL (ref 8–23)
CO2: 22 mmol/L (ref 22–32)
Calcium: 8.6 mg/dL — ABNORMAL LOW (ref 8.9–10.3)
Chloride: 106 mmol/L (ref 98–111)
Creatinine, Ser: 0.67 mg/dL (ref 0.44–1.00)
GFR, Estimated: >60 mL/min (ref >60)
Glucose, Bld: 81 mg/dL (ref 70–99)
Potassium: 3.5 mmol/L (ref 3.5–5.1)
Sodium: 139 mmol/L (ref 135–145)

## 2023-10-26 MED ORDER — PANTOPRAZOLE SODIUM 40 MG PO TBEC
40.0000 mg | DELAYED_RELEASE_TABLET | Freq: Every day | ORAL | Status: DC
  Administered 2023-10-26 – 2023-11-26 (×25): 40 mg via ORAL
  Filled 2023-10-26 (×32): qty 1

## 2023-10-26 MED ORDER — POTASSIUM CHLORIDE IN NACL 20-0.9 MEQ/L-% IV SOLN: INTRAVENOUS | Status: AC

## 2023-10-26 NOTE — Evaluation (Signed)
 Physical Therapy Evaluation Patient Details Name: Heidi Fuentes MRN: [id number] DOB: 11-13-1945 Today's Date: 10/26/2023  History of Present Illness  Heidi Fuentes is a 78 y.o. female with medical history significant for asthma, bipolar disorder, hypertension, chronic pain, and bronchiectasis.  Patient was brought to the ED via EMS for reports of altered mental status.  Patient is alone but has a caregiver, she called patient's son the patient was not her normal self, and he said to bring patient to the ED.  The time of my evaluation, patient is awake and alert and oriented to person, but not time, she is unable to tell me why she is here. She appears to answer other questions appropriately.  She denies difficulty breathing, no cough, no vomiting or diarrhea, no weakness of her extremities, endorses good oral intake.     She was hospitalized back in February- 2/1 to 2/6 also for altered mental status thought secondary to COVID infection and  hypoxia.   Clinical Impression  Pt was agreeable to complete today's PT evaluation. Her PLOF and household environment was unable to be determined due to her altered cognitive status. However, she was able to transfer from supine to sitting and to the chair with minA. She required the RW for safety with STS transfers and ambulation. She required cueing throughout today's evaluation to remain focused with each task. She was left in bed with the call bel within reach and bed alarm set. Patient will benefit from continued skilled physical therapy in hospital and recommended venue below to increase strength, balance, endurance for safe ADLs and gait.         If plan is discharge home, recommend the following: A little help with walking and/or transfers;A little help with bathing/dressing/bathroom;Assistance with cooking/housework;Help with stairs or ramp for entrance;Assist for transportation;Supervision due to cognitive status;Direct supervision/assist for medications  management   Can travel by private vehicle   Yes    Equipment Recommendations None recommended by PT  Recommendations for Other Services       Functional Status Assessment Patient has had a recent decline in their functional status and demonstrates the ability to make significant improvements in function in a reasonable and predictable amount of time.     Precautions / Restrictions Precautions Precautions: Fall Recall of Precautions/Restrictions: Impaired Restrictions Weight Bearing Restrictions Per Provider Order: No      Mobility  Bed Mobility Overal bed mobility: Needs Assistance Bed Mobility: Supine to Sit     Supine to sit: Min assist, HOB elevated          Transfers Overall transfer level: Needs assistance Equipment used: Rolling walker (2 wheels) Transfers: Sit to/from Stand, Bed to chair/wheelchair/BSC Sit to Stand: Min assist   Step pivot transfers: Min assist       General transfer comment: from bed to chair and chair to bed    Ambulation/Gait Ambulation/Gait assistance: Min assist Gait Distance (Feet): 5 Feet Assistive device: Rolling walker (2 wheels) Gait Pattern/deviations: Step-through pattern, Decreased stride length Gait velocity: decreased        Stairs            Wheelchair Mobility     Tilt Bed    Modified Rankin (Stroke Patients Only)       Balance Overall balance assessment: Needs assistance Sitting-balance support: No upper extremity supported, Feet supported Sitting balance-Leahy Scale: Fair Sitting balance - Comments: seated EOB     Standing balance-Leahy Scale: Fair Standing balance comment: with RW  Pertinent Vitals/Pain Pain Assessment Pain Assessment: Faces Faces Pain Scale: No hurt    Home Living Family/patient expects to be discharged to:: Private residence Living Arrangements: Alone                 Additional Comments: unable to confirm PLOF or  living environment due to altered mental status.    Prior Function                       Extremity/Trunk Assessment   Upper Extremity Assessment Upper Extremity Assessment: Generalized weakness    Lower Extremity Assessment Lower Extremity Assessment: Generalized weakness    Cervical / Trunk Assessment Cervical / Trunk Assessment: Normal  Communication   Communication Communication: No apparent difficulties    Cognition Arousal: Alert Behavior During Therapy: Impulsive   PT - Cognitive impairments: Attention, Safety/Judgement, Orientation, No family/caregiver present to determine baseline   Orientation impairments: Place, Time, Situation                     Following commands: Impaired Following commands impaired: Follows one step commands inconsistently, Follows one step commands with increased time     Cueing Cueing Techniques: Verbal cues, Gestural cues, Tactile cues, Visual cues     General Comments      Exercises     Assessment/Plan    PT Assessment Patient needs continued PT services  PT Problem List Decreased strength;Decreased activity tolerance;Decreased balance;Decreased mobility       PT Treatment Interventions DME instruction;Gait training;Stair training;Functional mobility training;Therapeutic activities;Therapeutic exercise;Balance training;Patient/family education    PT Goals (Current goals can be found in the Care Plan section)  Acute Rehab PT Goals Patient Stated Goal: Pt unable to state goals for rehab due to cognition PT Goal Formulation: Patient unable to participate in goal setting Time For Goal Achievement: 11/09/23    Frequency Min 3X/week     Co-evaluation               AM-PAC PT 6 Clicks Mobility  Outcome Measure Help needed turning from your back to your side while in a flat bed without using bedrails?: A Little Help needed moving from lying on your back to sitting on the side of a flat bed without  using bedrails?: A Little Help needed moving to and from a bed to a chair (including a wheelchair)?: A Little Help needed standing up from a chair using your arms (e.g., wheelchair or bedside chair)?: A Little Help needed to walk in hospital room?: A Little Help needed climbing 3-5 steps with a railing? : A Lot 6 Click Score: 17    End of Session Equipment Utilized During Treatment: Gait belt Activity Tolerance: Patient tolerated treatment well Patient left: in bed;with call bell/phone within reach;with bed alarm set   PT Visit Diagnosis: Unsteadiness on feet (R26.81);Muscle weakness (generalized) (M62.81);Difficulty in walking, not elsewhere classified (R26.2)    Time: 8879-8857 PT Time Calculation (min) (ACUTE ONLY): 22 min   Charges:   PT Evaluation $PT Eval Low Complexity: 1 Low PT Treatments $Therapeutic Activity: 8-22 mins PT General Charges $$ ACUTE PT VISIT: 1 Visit         Lacinda Fass, PT, DPT  10/26/2023, 12:04 PM

## 2023-10-26 NOTE — Plan of Care (Signed)
  Problem: Health Behavior/Discharge Planning: Goal: Ability to manage health-related needs will improve Outcome: Progressing   Problem: Clinical Measurements: Goal: Ability to maintain clinical measurements within normal limits will improve Outcome: Progressing Goal: Will remain free from infection Outcome: Progressing Goal: Respiratory complications will improve Outcome: Progressing   Problem: Activity: Goal: Risk for activity intolerance will decrease Outcome: Progressing   Problem: Coping: Goal: Level of anxiety will decrease Outcome: Progressing   Problem: Pain Managment: Goal: General experience of comfort will improve and/or be controlled Outcome: Progressing   Problem: Safety: Goal: Ability to remain free from injury will improve Outcome: Progressing   Problem: Skin Integrity: Goal: Risk for impaired skin integrity will decrease Outcome: Progressing

## 2023-10-26 NOTE — Progress Notes (Addendum)
 PROGRESS NOTE    Heidi Fuentes  FMW:[id number] DOB: 1945/06/20 DOA: 10/25/2023 PCP: [name], DO   Brief Narrative:  78 y.o. female with medical history significant for asthma, bipolar disorder, hypertension, chronic pain, and bronchiectasis presented with altered mental status.  At the time of evaluation by hospitalist on admission, patient was awake and alert and oriented to person but not time.  She was slightly tachycardic, tachypneic.  UA not suggestive of UTI.  Ammonia 18.  Head CT negative for acute abnormality.  Portable chest x-ray unremarkable.  MRI of brain was negative for any acute abnormality.  Assessment & Plan:   Acute possible metabolic or toxic encephalopathy -Unclear cause of altered mental status.  Mental status quickly improving but still oriented to person but not time.  CT and MRI of brain negative as above.  No focal neurologic deficit.  UA noncritical for UTI.  Respiratory clear.  Patient is on multiple psychotropic medications which can attribute to altered mental state.  Unclear which of these medications she is actually taking.  Patient apparently stopped taking Lamictal  by herself.  Other psychoactive medications are currently on hold -Ammonia normal.  Check B12, folic acid  and TSH levels in AM. -Monitor mental status.  Fall precautions.  PT eval  Hypothyroidism -Continue levothyroxine   Hypertension -Blood pressure stable.  Continue metoprolol   GERD Continue Protonix   COPD - Continue current inhaled regimen  Chronic pain - Apparently she is not taking oxycodone  anymore.  Is probably still taking gabapentin  which is currently on hold.  Outpatient follow-up with PCP and/or pain management  Anxiety/depression -Holding home regimen for now.  Physical deconditioning - PT eval  Hypokalemia - Resolved  DVT prophylaxis: Lovenox  Code Status: Full Family Communication: None at bedside Disposition Plan: Status is: Observation The patient will  require care spanning > 2 midnights and should be moved to inpatient because: Of severity of illness.    Consultants: None  Procedures: None  Antimicrobials: None   Subjective: Patient seen and examined at bedside.  Poor historian.  No seizures, agitation, vomiting reported.  Objective: Vitals:   10/25/23 2001 10/25/23 2200 10/26/23 0321 10/26/23 0803  BP: 130/76 130/76 126/61   Pulse: 97 97 78   Resp: 18  16   Temp:   98.2 F (36.8 C)   TempSrc:   Oral   SpO2: 98%  96% 97%  Weight:      Height: 4' 11 (1.499 m)       Intake/Output Summary (Last 24 hours) at 10/26/2023 0823 Last data filed at 10/25/2023 1522 Gross per 24 hour  Intake --  Output 13 ml  Net -13 ml   Filed Weights   10/25/23 1457  Weight: 52 kg    Examination:  General exam: Appears calm and comfortable. Respiratory system: Bilateral decreased breath sounds at bases Cardiovascular system: S1 & S2 heard, Rate controlled Gastrointestinal system: Abdomen is nondistended, soft and nontender. Normal bowel sounds heard. Extremities: No cyanosis, clubbing, edema  Central nervous system: Awake, remains confused to time and a poor historian.  No focal neurological deficits. Moving extremities Skin: No rashes, lesions or ulcers Psychiatry: Flat affect.  Not agitated.   Data Reviewed: I have personally reviewed following labs and imaging studies  CBC: Recent Labs  Lab 10/25/23 1513  WBC 8.1  NEUTROABS 4.1  HGB 14.5  HCT 43.2  MCV 96.9  PLT 274   Basic Metabolic Panel: Recent Labs  Lab 10/25/23 1513 10/26/23 0420  NA 137 139  K  3.3* 3.5  CL 99 106  CO2 23 22  GLUCOSE 116* 81  BUN 25* 23  CREATININE 0.70 0.67  CALCIUM  9.2 8.6*   GFR: Estimated Creatinine Clearance: 42.7 mL/min (by C-G formula based on SCr of 0.67 mg/dL). Liver Function Tests: Recent Labs  Lab 10/25/23 1513  AST 34  ALT 23  ALKPHOS 72  BILITOT 1.0  PROT 6.7  ALBUMIN 3.9   No results for input(s): LIPASE,  AMYLASE in the last 168 hours. Recent Labs  Lab 10/25/23 1513  AMMONIA 18   Coagulation Profile: No results for input(s): INR, PROTIME in the last 168 hours. Cardiac Enzymes: No results for input(s): CKTOTAL, CKMB, CKMBINDEX, TROPONINI in the last 168 hours. BNP (last 3 results) No results for input(s): PROBNP in the last 8760 hours. HbA1C: No results for input(s): HGBA1C in the last 72 hours. CBG: No results for input(s): GLUCAP in the last 168 hours. Lipid Profile: No results for input(s): CHOL, HDL, LDLCALC, TRIG, CHOLHDL, LDLDIRECT in the last 72 hours. Thyroid  Function Tests: No results for input(s): TSH, T4TOTAL, FREET4, T3FREE, THYROIDAB in the last 72 hours. Anemia Panel: No results for input(s): VITAMINB12, FOLATE, FERRITIN, TIBC, IRON, RETICCTPCT in the last 72 hours. Sepsis Labs: No results for input(s): PROCALCITON, LATICACIDVEN in the last 168 hours.  No results found for this or any previous visit (from the past 240 hours).       Radiology Studies: MR BRAIN WO CONTRAST Result Date: 10/25/2023 CLINICAL DATA:  Provided history: Mental status change, unknown cause. EXAM: MRI HEAD WITHOUT CONTRAST TECHNIQUE: Multiplanar, multiecho pulse sequences of the brain and surrounding structures were obtained without intravenous contrast. COMPARISON:  Head CT 10/25/2023.  Brain MRI 07/07/2006. FINDINGS: Intermittently motion degraded examination. Most notably, the sagittal T1 sequence is severely motion degraded, the axial T2 sequence is moderate-to-severely motion degraded, the axial FLAIR sequence is severely motion degraded and the axial T1 sequence is moderately motion degraded. Within this limitation, findings are as follows. Brain: Generalized cerebral atrophy. Multifocal T2 FLAIR hyperintense signal abnormality within the cerebral white matter, nonspecific but compatible with moderate chronic small vessel ischemic  disease. No evidence of acute infarct. No intracranial mass or extra-axial fluid collection is identified. No chronic intracranial blood products. No midline shift. Vascular: Maintained flow voids within the proximal large arterial vessels. Skull and upper cervical spine: No focal worrisome marrow lesion. Multilevel facet arthropathy within the cervical spine at the visible levels. Sinuses/Orbits: No mass or acute finding within the imaged orbits. Redemonstrated chronic, depressed fracture deformity of the left orbital floor. No significant paranasal sinus disease. IMPRESSION: 1. Significantly motion degraded examination. Within this limitation, findings are as follows. 2. No acute intracranial abnormality is identified. The diffusion-weighted imaging is of good quality and there is no evidence of an acute infarct. 3. Moderate chronic small vessel ischemic changes within the cerebral white matter. 4. Generalized cerebral atrophy. Electronically Signed   By: Rockey Childs D.O.   On: 10/25/2023 20:13   CT HEAD WO CONTRAST Result Date: 10/25/2023 CLINICAL DATA:  Altered mental status.  Chronic UTIs. EXAM: CT HEAD WITHOUT CONTRAST TECHNIQUE: Contiguous axial images were obtained from the base of the skull through the vertex without intravenous contrast. RADIATION DOSE REDUCTION: This exam was performed according to the departmental dose-optimization program which includes automated exposure control, adjustment of the mA and/or kV according to patient size and/or use of iterative reconstruction technique. COMPARISON:  03/07/2023 FINDINGS: Brain: Ventricles, cisterns and other CSF spaces are normal. Mild chronic  ischemic microvascular disease is present. No mass, mass effect, shift of midline structures or acute hemorrhage. No acute infarction. Vascular: No hyperdense vessel or unexpected calcification. Skull: Normal. Negative for fracture or focal lesion. Sinuses/Orbits: No acute finding. Other: None. IMPRESSION: 1. No  acute findings. 2. Mild chronic ischemic microvascular disease. Electronically Signed   By: Toribio Agreste M.D.   On: 10/25/2023 16:29   DG Chest Port 1 View Result Date: 10/25/2023 CLINICAL DATA:  Altered mental status, back pain. EXAM: PORTABLE CHEST 1 VIEW COMPARISON:  03/06/2023. FINDINGS: Trachea is midline. Heart size within normal limits. Thoracic aorta is calcified. Lungs are clear. No pleural fluid. IMPRESSION: No acute findings. Electronically Signed   By: Newell Eke M.D.   On: 10/25/2023 16:10        Scheduled Meds:  enoxaparin  (LOVENOX ) injection  40 mg Subcutaneous Q24H   fluticasone  furoate-vilanterol  1 puff Inhalation Daily   levothyroxine   25 mcg Oral QAC breakfast   metoprolol  tartrate  12.5 mg Oral BID   pantoprazole   40 mg Oral Daily   Continuous Infusions:  0.9 % NaCl with KCl 20 mEq / L 75 mL/hr at 10/25/23 2227          Sophie Mao, MD [facility] 10/26/2023, 8:23 AM

## 2023-10-26 NOTE — TOC Initial Note (Addendum)
 Transition of Care [facility]) - Initial/Assessment Note    Patient Details  Name: Heidi Fuentes MRN: [id number] Date of Birth: May 11, 1945  Transition of [facility]) CM/SW Contact:    Hoy DELENA Bigness, LCSW Phone Number: 10/26/2023, 10:48 AM  Clinical Narrative:                 CSW spoke with pt's son, Dempsey to complete assessment. Pt is from home alone. Pt does not receive any home services after firing all of her caregivers. Pt's sons are no longer involved in her care and will not make decisions for her. They feel that she needs a legal guardian however, sons are unwilling to take on this responsibility. Pt's son declined to make decision for SNF placement however, feels that she needs SNF and eventually LTC placement. Pt does not have Medicaid for LTC and does not qualify for Medicaid at this time due to need to spend down.   CSW spoke with pt's friend Niels, who shares she does not have the power to make decisions for pt but, does agree she needs SNF placement.   APS report made with Elenor at [facility] DSS.   Referrals have been faxed out for SNF and currently awaiting bed offers.    Expected Discharge Plan: Skilled Nursing Facility Barriers to Discharge: Continued Medical Work up   Patient Goals and CMS Choice Patient states their goals for this hospitalization and ongoing recovery are:: For pt to be placed in facility CMS Medicare.gov Compare Post Acute Care list provided to:: Patient Represenative (must comment) Choice offered to / list presented to : Adult Children, HC POA / Guardian      Expected Discharge Plan and Services In-house Referral: Clinical Social Work Discharge Planning Services: NA Post Acute Care Choice: Nursing Home, Skilled Nursing Facility Living arrangements for the past 2 months: Single Family Home                                      Prior Living Arrangements/Services Living arrangements for the past 2 months: Single Family Home Lives  with:: Self Patient language and need for interpreter reviewed:: Yes Do you feel safe going back to the place where you live?: Yes (Son does not feel she is safe due to her not taking her medications)      Need for Family Participation in Patient Care: Yes (Comment) Care giver support system in place?: No (comment) Current home services: DME (RW, cane) Criminal Activity/Legal Involvement Pertinent to Current Situation/Hospitalization: No - Comment as needed  Activities of Daily Living   ADL Screening (condition at time of admission) Independently performs ADLs?: No Does the patient have a NEW difficulty with getting in/out of bed, walking, or climbing stairs that is expected to last >3 days?: Yes (Initiates electronic notice to provider for possible PT consult) Does the patient have a NEW difficulty with communication that is expected to last >3 days?: No Is the patient deaf or have difficulty hearing?: 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) Does the patient have difficulty seeing, even when wearing glasses/contacts?: Yes (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) Does the patient have difficulty concentrating, remembering, or making decisions?: No  Permission Sought/Granted Permission sought to share information with : Facility Industrial/product designer granted to share information with : Yes, Verbal Permission Granted     Permission granted to share info w AGENCY: SNF's and ALF's  Emotional Assessment Appearance:: Appears stated age Attitude/Demeanor/Rapport: Unable to Assess Affect (typically observed): Unable to Assess Orientation: : Oriented to Self Alcohol / Substance Use: Not Applicable Psych Involvement: No (comment)  Admission diagnosis:  Delirium [R41.0] Acute encephalopathy [G93.40] Patient Active Problem List   Diagnosis Date Noted   Acute encephalopathy 10/25/2023   Acute metabolic encephalopathy 03/06/2023   COVID-19 virus infection 03/06/2023    Acute hypoxic respiratory failure (HCC) 03/06/2023   Rectal prolapse 02/24/2023   Sacral fracture, closed (HCC) 11/20/2022   Lumbar vertebral fracture (HCC) 11/20/2022   Compression fracture of pelvis (HCC) 11/10/2022   Aspiration pneumonia (HCC) 11/09/2022   Agitation 07/27/2022   Mixed bipolar I disorder (HCC) 07/22/2022   Delirium 07/22/2022   Depression, major, recurrent, mild 07/22/2022   Severe sepsis (HCC) 07/15/2022   UTI (urinary tract infection) 07/15/2022   Encephalopathy, metabolic 07/15/2022   History of adenomatous polyp of colon 05/12/2021   Bipolar affective disorder, depressed, mild (HCC) 08/07/2020   Rhabdomyolysis 08/03/2020   Elevated LFTs 08/03/2020   Unilateral primary osteoarthritis, left hip 03/26/2020   Pedal edema 03/18/2020   Pain in joint involving ankle and foot 12/20/2019   Bronchiectasis without complication (HCC) 10/31/2019   DOE (dyspnea on exertion) 07/27/2019   Dysphagia 07/25/2018   Leukocytosis 07/25/2018   Closed fracture of left orbital floor (HCC) 07/25/2018   Exudative age-related macular degeneration, bilateral, with active choroidal neovascularization (HCC) 07/25/2018   CAP (community acquired pneumonia) due to MRSA (methicillin resistant Staphylococcus aureus) (HCC) 02/13/2018   Asthma, chronic, unspecified asthma severity, with acute exacerbation 02/04/2018   Cough 11/02/2017   Gastroesophageal reflux disease without esophagitis 10/30/2017   Other specified glaucoma 10/30/2017   Uncomplicated asthma 10/30/2017   Chronic rhinitis 10/30/2017   Long-term current use of opiate analgesic 03/10/2017   Essential hypertension 02/17/2017   On long term drug therapy 02/08/2017   Chronic narcotic use 02/04/2017   Hiatal hernia 02/03/2017   Hypokalemia 09/06/2015   Fall    Iron deficiency anemia, unspecified 12/15/2013   Schatzki's ring 08/17/2013   Anemia 08/17/2013   Encounter for screening colonoscopy 08/17/2013   Constipation 07/10/2013    Chronic pain syndrome 07/10/2013   Acquired hypothyroidism 07/09/2013   Colitis 07/09/2013   Asthma, chronic 07/09/2013   PCP:  [name], DO Pharmacy:   [facility] - [place], [place] - [address] [place] [address] Phone: [phone] Fax: [phone]  [facility] - [place], [place] - [address] [place] [address] Phone: [phone] Fax: [phone]     Social Drivers of Health (SDOH) Social History: SDOH Screenings   Food Insecurity: No Food Insecurity (10/25/2023)  Housing: Low Risk  (10/25/2023)  Transportation Needs: No Transportation Needs (10/25/2023)  Utilities: Not At Risk (10/25/2023)  Social Connections: Unknown (10/25/2023)  Tobacco Use: Low Risk  (10/25/2023)   SDOH Interventions:     Readmission Risk Interventions    03/07/2023    2:31 PM 11/10/2022   11:05 AM  Readmission Risk Prevention Plan  Transportation Screening Complete Complete  Medication Review Oceanographer) Complete Complete  PCP or Specialist appointment within 3-5 days of discharge  Not Complete  HRI or Home Care Consult Complete Complete  SW Recovery Care/Counseling Consult Complete Complete  Palliative Care Screening Not Applicable Not Applicable  Skilled Nursing Facility -- Not Complete

## 2023-10-26 NOTE — Plan of Care (Signed)
  Problem: Acute Rehab PT Goals(only PT should resolve) Goal: Pt Will Go Supine/Side To Sit Outcome: Progressing Flowsheets (Taken 10/26/2023 1205) Pt will go Supine/Side to Sit: with supervision Goal: Patient Will Transfer Sit To/From Stand Outcome: Progressing Flowsheets (Taken 10/26/2023 1205) Patient will transfer sit to/from stand: with supervision Goal: Pt Will Transfer Bed To Chair/Chair To Bed Outcome: Progressing Flowsheets (Taken 10/26/2023 1205) Pt will Transfer Bed to Chair/Chair to Bed: with supervision Goal: Pt Will Ambulate Outcome: Progressing Flowsheets (Taken 10/26/2023 1205) Pt will Ambulate:  15 feet  with supervision  with rolling walker   Lacinda Fass, PT, DPT

## 2023-10-26 NOTE — NC FL2 (Signed)
 [place]  MEDICAID FL2 LEVEL OF CARE FORM     IDENTIFICATION  Patient Name: Heidi Fuentes Birthdate: May 07, 1945 Sex: female Admission Date (Current Location): 10/25/2023  [facility] and [place] Number:  [employer] and Address:  [facility],  [address], [place] [address]      Provider Number: [id number]  Attending Physician Name and Address:  Cheryle Page, MD  Relative Name and Phone Number:       Current Level of Care: Hospital Recommended Level of Care: Skilled Nursing Facility Prior Approval Number:    Date Approved/Denied:   PASRR Number: [id number]  Discharge Plan: SNF    Current Diagnoses: Patient Active Problem List   Diagnosis Date Noted   Acute encephalopathy 10/25/2023   Acute metabolic encephalopathy 03/06/2023   COVID-19 virus infection 03/06/2023   Acute hypoxic respiratory failure (HCC) 03/06/2023   Rectal prolapse 02/24/2023   Sacral fracture, closed (HCC) 11/20/2022   Lumbar vertebral fracture (HCC) 11/20/2022   Compression fracture of pelvis (HCC) 11/10/2022   Aspiration pneumonia (HCC) 11/09/2022   Agitation 07/27/2022   Mixed bipolar I disorder (HCC) 07/22/2022   Delirium 07/22/2022   Depression, major, recurrent, mild 07/22/2022   Severe sepsis (HCC) 07/15/2022   UTI (urinary tract infection) 07/15/2022   Encephalopathy, metabolic 07/15/2022   History of adenomatous polyp of colon 05/12/2021   Bipolar affective disorder, depressed, mild (HCC) 08/07/2020   Rhabdomyolysis 08/03/2020   Elevated LFTs 08/03/2020   Unilateral primary osteoarthritis, left hip 03/26/2020   Pedal edema 03/18/2020   Pain in joint involving ankle and foot 12/20/2019   Bronchiectasis without complication (HCC) 10/31/2019   DOE (dyspnea on exertion) 07/27/2019   Dysphagia 07/25/2018   Leukocytosis 07/25/2018   Closed fracture of left orbital floor (HCC) 07/25/2018   Exudative age-related macular degeneration, bilateral, with active  choroidal neovascularization (HCC) 07/25/2018   CAP (community acquired pneumonia) due to MRSA (methicillin resistant Staphylococcus aureus) (HCC) 02/13/2018   Asthma, chronic, unspecified asthma severity, with acute exacerbation 02/04/2018   Cough 11/02/2017   Gastroesophageal reflux disease without esophagitis 10/30/2017   Other specified glaucoma 10/30/2017   Uncomplicated asthma 10/30/2017   Chronic rhinitis 10/30/2017   Long-term current use of opiate analgesic 03/10/2017   Essential hypertension 02/17/2017   On long term drug therapy 02/08/2017   Chronic narcotic use 02/04/2017   Hiatal hernia 02/03/2017   Hypokalemia 09/06/2015   Fall    Iron deficiency anemia, unspecified 12/15/2013   Schatzki's ring 08/17/2013   Anemia 08/17/2013   Encounter for screening colonoscopy 08/17/2013   Constipation 07/10/2013   Chronic pain syndrome 07/10/2013   Acquired hypothyroidism 07/09/2013   Colitis 07/09/2013   Asthma, chronic 07/09/2013    Orientation RESPIRATION BLADDER Height & Weight     Self  Normal Continent Weight: 114 lb 10.2 oz (52 kg) Height:  4' 11 (149.9 cm)  BEHAVIORAL SYMPTOMS/MOOD NEUROLOGICAL BOWEL NUTRITION STATUS      Continent Diet (See DC summary)  AMBULATORY STATUS COMMUNICATION OF NEEDS Skin   Limited Assist Verbally Normal                       Personal Care Assistance Level of Assistance  Bathing, Feeding, Dressing Bathing Assistance: Limited assistance Feeding assistance: Limited assistance Dressing Assistance: Limited assistance     Functional Limitations Info  Sight, Hearing, Speech Sight Info: Impaired (eyeglasses) Hearing Info: Adequate Speech Info: Adequate    SPECIAL CARE FACTORS FREQUENCY  PT (By licensed PT), OT (By  licensed OT)     PT Frequency: 5x/wk OT Frequency: 5x/wk            Contractures Contractures Info: Not present    Additional Factors Info  Code Status, Allergies, Psychotropic Code Status Info:  FULL Allergies Info: Sulfa Antibiotics, Dog Epithelium (Canis Lupus Familiaris), Mirtazapine, Trazodone, Trazodone Hcl, Ziprasidone, Aripiprazole, Quetiapine , Trazodone And Nefazodone, Ziprasidone Hcl Psychotropic Info: See MAR         Current Medications (10/26/2023):  This is the current hospital active medication list Current Facility-Administered Medications  Medication Dose Route Frequency Provider Last Rate Last Admin   0.9 % NaCl with KCl 20 mEq/ L  infusion   Intravenous Continuous Alekh, Kshitiz, MD       acetaminophen  (TYLENOL ) tablet 650 mg  650 mg Oral Q6H PRN Emokpae, Ejiroghene E, MD       Or   acetaminophen  (TYLENOL ) suppository 650 mg  650 mg Rectal Q6H PRN Emokpae, Ejiroghene E, MD       enoxaparin  (LOVENOX ) injection 40 mg  40 mg Subcutaneous Q24H Emokpae, Ejiroghene E, MD   40 mg at 10/25/23 2202   fluticasone  furoate-vilanterol (BREO ELLIPTA ) 200-25 MCG/ACT 1 puff  1 puff Inhalation Daily Emokpae, Ejiroghene E, MD   1 puff at 10/26/23 0803   levothyroxine  (SYNTHROID ) tablet 25 mcg  25 mcg Oral QAC breakfast Emokpae, Ejiroghene E, MD       metoprolol  tartrate (LOPRESSOR ) tablet 12.5 mg  12.5 mg Oral BID Emokpae, Ejiroghene E, MD   12.5 mg at 10/26/23 9183   ondansetron  (ZOFRAN ) tablet 4 mg  4 mg Oral Q6H PRN Emokpae, Ejiroghene E, MD       Or   ondansetron  (ZOFRAN ) injection 4 mg  4 mg Intravenous Q6H PRN Emokpae, Ejiroghene E, MD       pantoprazole  (PROTONIX ) EC tablet 40 mg  40 mg Oral Daily Madueme, Elvira C, RPH   40 mg at 10/26/23 9178   polyethylene glycol (MIRALAX  / GLYCOLAX ) packet 17 g  17 g Oral Daily PRN Emokpae, Ejiroghene E, MD         Discharge Medications: Please see discharge summary for a list of discharge medications.  Relevant Imaging Results:  Relevant Lab Results:   Additional Information SSN: [id number]  Hoy DELENA Bigness, LCSW

## 2023-10-26 NOTE — Progress Notes (Signed)
 Patient bladder scanned due to no urine occurences thus far on shift, noted, patient got OOB to [facility] and urinated with no difficulty. MD made aware.

## 2023-10-27 DIAGNOSIS — G9340 Encephalopathy, unspecified: Secondary | ICD-10-CM | POA: Diagnosis not present

## 2023-10-27 DIAGNOSIS — F3131 Bipolar disorder, current episode depressed, mild: Secondary | ICD-10-CM | POA: Diagnosis not present

## 2023-10-27 DIAGNOSIS — F1190 Opioid use, unspecified, uncomplicated: Secondary | ICD-10-CM | POA: Diagnosis not present

## 2023-10-27 DIAGNOSIS — E039 Hypothyroidism, unspecified: Secondary | ICD-10-CM | POA: Diagnosis not present

## 2023-10-27 LAB — COMPREHENSIVE METABOLIC PANEL WITH GFR
ALT: 23 U/L (ref 0–44)
AST: 34 U/L (ref 15–41)
Albumin: 3.5 g/dL (ref 3.5–5.0)
Alkaline Phosphatase: 68 U/L (ref 38–126)
Anion gap: 11 (ref 5–15)
BUN: 16 mg/dL (ref 8–23)
CO2: 24 mmol/L (ref 22–32)
Calcium: 8.9 mg/dL (ref 8.9–10.3)
Chloride: 104 mmol/L (ref 98–111)
Creatinine, Ser: 0.59 mg/dL (ref 0.44–1.00)
GFR, Estimated: >60 mL/min (ref >60)
Glucose, Bld: 85 mg/dL (ref 70–99)
Potassium: 4 mmol/L (ref 3.5–5.1)
Sodium: 139 mmol/L (ref 135–145)
Total Bilirubin: 0.9 mg/dL (ref 0.0–1.2)
Total Protein: 6.3 g/dL — ABNORMAL LOW (ref 6.5–8.1)

## 2023-10-27 LAB — CBC WITH DIFFERENTIAL/PLATELET
Abs Immature Granulocytes: 0.03 K/uL (ref 0.00–0.07)
Basophils Absolute: 0 K/uL (ref 0.0–0.1)
Basophils Relative: 0 %
Eosinophils Absolute: 0.2 K/uL (ref 0.0–0.5)
Eosinophils Relative: 3 %
HCT: 42 % (ref 36.0–46.0)
Hemoglobin: 14.3 g/dL (ref 12.0–15.0)
Immature Granulocytes: 0 %
Lymphocytes Relative: 36 %
Lymphs Abs: 2.8 K/uL (ref 0.7–4.0)
MCH: 32.9 pg (ref 26.0–34.0)
MCHC: 34 g/dL (ref 30.0–36.0)
MCV: 96.6 fL (ref 80.0–100.0)
Monocytes Absolute: 1.1 K/uL — ABNORMAL HIGH (ref 0.1–1.0)
Monocytes Relative: 14 %
Neutro Abs: 3.6 K/uL (ref 1.7–7.7)
Neutrophils Relative %: 47 %
Platelets: 266 K/uL (ref 150–400)
RBC: 4.35 MIL/uL (ref 3.87–5.11)
RDW: 12.3 % (ref 11.5–15.5)
WBC: 7.8 K/uL (ref 4.0–10.5)
nRBC: 0 % (ref 0.0–0.2)

## 2023-10-27 LAB — MAGNESIUM: Magnesium: 2 mg/dL (ref 1.7–2.4)

## 2023-10-27 LAB — TSH: TSH: 1.425 u[IU]/mL (ref 0.350–4.500)

## 2023-10-27 LAB — FOLATE: Folate: 17.6 ng/mL (ref >5.9)

## 2023-10-27 LAB — VITAMIN B12: Vitamin B-12: 286 pg/mL (ref 180–914)

## 2023-10-27 NOTE — Progress Notes (Addendum)
 Pt urine output 400 ml during this shift without difficulty. Urine occurrence documented in Epic.   Pt did not sleep during the night, experienced constant delusions talking loudly to self. After being awake the entire night, this pt has finally succumb to tiredness, and is now asleep. Rise and fall of chest visualized.

## 2023-10-27 NOTE — TOC Progression Note (Signed)
 Transition of Care [facility]) - Progression Note    Patient Details  Name: ARAYLA KRUSCHKE MRN: [id number] Date of Birth: Jul 22, 1945  Transition of Care [facility]) CM/SW Contact  Hoy DELENA Bigness, LCSW Phone Number: 10/27/2023, 3:01 PM  Clinical Narrative:    Pt's APS case has been transferred to [facility] DSS as there is a conflict of interest with case being in [place]. SW assigned to case is Teaching laboratory technician. CSW has reached out via email and currently awaiting decision.    Expected Discharge Plan: Skilled Nursing Facility Barriers to Discharge: Continued Medical Work up               Expected Discharge Plan and Services In-house Referral: Clinical Social Work Discharge Planning Services: NA Post Acute Care Choice: Nursing Home, Skilled Nursing Facility Living arrangements for the past 2 months: Single Family Home                                       Social Drivers of Health (SDOH) Interventions SDOH Screenings   Food Insecurity: No Food Insecurity (10/25/2023)  Housing: Low Risk  (10/25/2023)  Transportation Needs: No Transportation Needs (10/25/2023)  Utilities: Not At Risk (10/25/2023)  Social Connections: Unknown (10/25/2023)  Tobacco Use: Low Risk  (10/25/2023)    Readmission Risk Interventions    03/07/2023    2:31 PM 11/10/2022   11:05 AM  Readmission Risk Prevention Plan  Transportation Screening Complete Complete  Medication Review Oceanographer) Complete Complete  PCP or Specialist appointment within 3-5 days of discharge  Not Complete  HRI or Home Care Consult Complete Complete  SW Recovery Care/Counseling Consult Complete Complete  Palliative Care Screening Not Applicable Not Applicable  Skilled Nursing Facility -- Not Complete

## 2023-10-27 NOTE — Plan of Care (Signed)
   Problem: Education: Goal: Knowledge of General Education information will improve Description: Including pain rating scale, medication(s)/side effects and non-pharmacologic comfort measures Outcome: Progressing   Problem: Clinical Measurements: Goal: Ability to maintain clinical measurements within normal limits will improve Outcome: Progressing Goal: Will remain free from infection Outcome: Progressing

## 2023-10-27 NOTE — Evaluation (Signed)
 Clinical/Bedside Swallow Evaluation Patient Details  Name: Heidi Fuentes MRN: [id number] Date of Birth: 12/28/1945  Today's Date: 10/27/2023 Time: SLP Start Time (ACUTE ONLY): 1415 SLP Stop Time (ACUTE ONLY): 1438 SLP Time Calculation (min) (ACUTE ONLY): 23 min  Past Medical History:  Past Medical History:  Diagnosis Date   Arthritis    Asthma    Back pain    Bipolar disorder (HCC)    Chronic pain syndrome 07/10/2013   Depression    Duodenal papillary stenosis    Dyspnea    Esophageal dysmotility    Fibromyalgia    GERD (gastroesophageal reflux disease)    Glaucoma    H/O wheezing    History of hiatal hernia    History of palpitations    HOH (hard of hearing)    Hypertension    Hypothyroidism    Orthopnea    Pneumonia    Stenosis, spinal, lumbar    Thyroid  disease    Tubular adenoma    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[facility])    Past Surgical History:  Past Surgical History:  Procedure Laterality Date   CATARACT EXTRACTION W/PHACO Right 10/06/2016   Procedure: CATARACT EXTRACTION PHACO AND INTRAOCULAR LENS PLACEMENT (IOC);  Surgeon: Jaye Fallow, MD;  Location: [facility] ORS;  Service: Ophthalmology;  Laterality: Right;  US  00:32.5 AP% 14.5 CDE 4.71 Fluid pack lot # [id number]   CATARACT EXTRACTION W/PHACO Left 11/03/2016   Procedure: CATARACT EXTRACTION PHACO AND INTRAOCULAR LENS PLACEMENT (IOC);  Surgeon: Jaye Fallow, MD;  Location: [facility] ORS;  Service: Ophthalmology;  Laterality: Left;  US  00:36.6 AP% 16.0 CDE 5.86 Fluid Pack lot # [id number]   COCCYX REMOVAL     colonoscopy  2005   Dr. Golda: mild melanosis coli, otherwise normal   COLONOSCOPY WITH PROPOFOL  N/A 09/14/2013   MFM:Fzojwndpd coli. Colonic diverticulosis. Single colonic. Tubular adenoma. Next TCS 09/2020.   COLONOSCOPY WITH PROPOFOL  N/A 07/24/2021   Procedure: COLONOSCOPY WITH PROPOFOL ;  Surgeon: Shaaron Lamar HERO, MD;  Location: [facility];  Service: Endoscopy;  Laterality: N/A;  2:00pm   ERCP   1997   [facility]: biliary manometry abnormal, subsequent sphincterotomy    ESOPHAGEAL MANOMETRY N/A 12/21/2016   Surgeon: Shila Gustav GAILS, MD; EG junction outflow obstruction which could be secondary to large hiatal hernia and possible coiling of catheter.  Not consistent with achalasia or other variant.  No major peristaltic motility disorder.   ESOPHAGOGASTRODUODENOSCOPY (EGD) WITH PROPOFOL  N/A 09/14/2013   MFM:Dryjusxp'd ring. Hiatal hernia. Status post Agapito and biopsy disruption.    FOOT SURGERY Bilateral    Bunionectomy and hammer toes   HEMORROIDECTOMY     HERNIA REPAIR     inguinal right   LAPAROSCOPIC PARAESOPHAGEAL HERNIA REPAIR  02/2017   Dr. Adelia [facility])   OREGON DILATION N/A 09/14/2013   Procedure: AGAPITO HODGKIN;  Surgeon: Lamar HERO Shaaron, MD;  Location: [facility];  Service: Endoscopy;  Laterality: N/A;  56   POLYPECTOMY N/A 09/14/2013   Procedure: POLYPECTOMY;  Surgeon: Lamar HERO Shaaron, MD;  Location: [facility]S;  Service: Endoscopy;  Laterality: N/A;   POLYPECTOMY  07/24/2021   Procedure: POLYPECTOMY;  Surgeon: Shaaron Lamar HERO, MD;  Location: [facility];  Service: Endoscopy;;   TONSILLECTOMY     XI ROBOT ASSISTED RECTOPEXY N/A 02/24/2023   Procedure: XI ROBOT ASSISTED RECTOPEXY;  Surgeon: Debby Hila, MD;  Location: [facility] ORS;  Service: General;  Laterality: N/A;   HPI:  78 y.o. female with medical history significant for asthma, bipolar disorder,  hypertension, chronic pain, and bronchiectasis presented with altered mental status.  At the time of evaluation by hospitalist on admission, patient was awake and alert and oriented to person but not time.  She was slightly tachycardic, tachypneic.  UA not suggestive of UTI.  Ammonia 18.  Head CT negative for acute abnormality.  Portable chest x-ray unremarkable.  MRI of brain was negative for any acute abnormality. BSE requested    Assessment / Plan / Recommendation  Clinical Impression  Clinical swallowing  evaluation completed while Pt was sitting upright in bed; Pt emphatically communicating and talking about inaccurate information that she strongly believes to be fact. For example: Do you have any questions? Come on over here and we can talk about your insurance. There are two or three or we will give them a run for her money!! Pt talked in phrases and sentences of unrelated information throughout the entire evaluation. RN reports Pt had a choking episode with pancakes. Pt is edentulous and reports she cannot eat a cracker or hard things without them but also reports she does not always wear them to eat (unsure if this is accurate). Pt consumed thin liquids and puree textures without overt s/sx of aspiration; secondary to incessant talking limited trials were accepted and consumed. Pt was unwilling/reports she cannot eat regular textures without her dentures. Recommend downgrade Pt's diet to D2/fine chopped diet and continue with thin liquids. Meds are ok whole with liquids. There are no further ST needs noted at this time. Thank you for this referral, SLP Visit Diagnosis: Dysphagia, unspecified (R13.10)       Diet Recommendation Dysphagia 2 (Fine chop);Thin liquid    Liquid Administration via: Cup;Straw Medication Administration: Whole meds with liquid Supervision: Patient able to self feed Compensations: Minimize environmental distractions;Slow rate;Small sips/bites Postural Changes: Seated upright at 90 degrees    Other  Recommendations Oral Care Recommendations: Oral care BID        Functional Status Assessment Patient has had a recent decline in their functional status and demonstrates the ability to make significant improvements in function in a reasonable and predictable amount of time.    Swallow Study   General Date of Onset: 10/25/23 HPI: 78 y.o. female with medical history significant for asthma, bipolar disorder, hypertension, chronic pain, and bronchiectasis presented with altered  mental status.  At the time of evaluation by hospitalist on admission, patient was awake and alert and oriented to person but not time.  She was slightly tachycardic, tachypneic.  UA not suggestive of UTI.  Ammonia 18.  Head CT negative for acute abnormality.  Portable chest x-ray unremarkable.  MRI of brain was negative for any acute abnormality. BSE requested Type of Study: Bedside Swallow Evaluation Previous Swallow Assessment: none in chart Diet Prior to this Study: Regular;Thin liquids (Level 0) Temperature Spikes Noted: No Respiratory Status: Room air History of Recent Intubation: No Behavior/Cognition: Alert;Cooperative;Confused;Distractible;Requires cueing Oral Cavity Assessment: Within Functional Limits Oral Care Completed by SLP: Recent completion by staff Oral Cavity - Dentition: Edentulous;Dentures, not available Vision: Functional for self-feeding Self-Feeding Abilities: Able to feed self Patient Positioning: Upright in bed Baseline Vocal Quality: Normal Volitional Cough: Strong Volitional Swallow: Able to elicit    Oral/Motor/Sensory Function Overall Oral Motor/Sensory Function: Within functional limits   Ice Chips Ice chips: Within functional limits   Thin Liquid Thin Liquid: Within functional limits    Nectar Thick Nectar Thick Liquid: Not tested   Honey Thick Honey Thick Liquid: Not tested   Puree Puree: Within  functional limits   Solid     Solid: Within functional limits     Renate Danh H. Clois KILLIAN, CCC-SLP Speech Language Pathologist  Raguel VEAR Clois 10/27/2023,2:58 PM

## 2023-10-27 NOTE — Hospital Course (Addendum)
 Brief Narrative:    78 y.o. female with past medical history significant for asthma, bipolar disorder, hypertension, chronic pain/fibromyalgia, and bronchiectasis presented to the hospital with altered mental status.  She was also noted to be tachycardic and tachypneic.  Labs were notable for negative CT head scan.  Chest x-ray was negative.  MRI of the brain was negative as well.  UA not suggestive of infection. During early part of the hospitalization, the patient remained primarily agitated.  She refused to take her medications and would frequently spit them out.  Her oral intake was poor.  Her metabolic derangements were optimized.  Workup for reversible causes including serum B12, folic acid , TSH, and ammonia were largely unremarkable.  Most of her medications were discontinued as the patient was not appropriately taking that at home.  Subsequently patient was willing to take her medications.  She did have argumentativeness and social services was consulted to assist with guardianship.  Currently awaiting for safe disposition.    Assessment & Plan:    Acute metabolic/toxic encephalopathy with delirium  Probable progressive cognitive impairment/ polypharmacy on admission.   Multiple psychiatry medications has been discontinued/adjusted.  Currently patient is on Prozac , Lamictal  and Haldol  and lorazepam  as needed. Currently awaiting guardianship.   Sitter and security for agitation as needed.  Increased haldol  dose on 10/11 for PRN severe agitation only.  Added lorazepam  as needed for severe agitation only.   I requested a new TTS eval on 10/11 but they did not see patient until 10/12. See new recommendations.    Bipolar disorder IRIS team saw the patient on 9/29, does not meet inpatient criteria.  awaiting SNF placement per therapy.  Haldol  and lorazepam  only to be used for severe agitation only.   TTS consult requested on 10/11 and seen patient on 10/12, see new recommendations.  Increased  dose of risperdal  per recommendations to 0.25 mg BID on 10/14, and in the next 24-48 hours would titrate dose further to goal of 0.5 mg BID per psychiatrist recommendations Continue lamictal  25 mg BID per psychiatrist recommendation   Hypothyroidism Continue Synthroid .  Latest TSH of 1.4   Essential hypertension Continue metoprolol  and Cozaar    Abnormal UA Question of UTI Pt is too mentally ill to get an accurate history regarding dysuria symptoms, therefore we treated with a dose of fosfomycin on 11/15/23   GERD Continue Protonix    COPD Continue bronchodilators   Chronic low back pain Pain management ordered with 1 time dose of ketorolac  given this morning, continue lidocaine  patch   Anxiety/depression Continue Lamictal  and Prozac    Physical deconditioning Continue physical therapy and recommended skilled nursing facility placement.   Hypokalemia Replenished and improved.  Latest potassium of 4.3.        DVT prophylaxis: enoxaparin  (LOVENOX ) injection 40 mg Start: 10/25/23 2030  Disposition: Skilled nursing facility.  Status is: Inpatient Remains inpatient appropriate because: Awaiting guardianship and placement  Code Status:    Code Status: Full Code  Family Communication: None at bedside Status is: Inpatient Remains inpatient appropriate because: Pending safe dispo   PT Follow up Recs: Follow Physician's Recommendations For Discharge Plan And Follow Up Therapies10/08/2023 0903  Subjective: No complaints.    Examination:  General exam: Appears calm and comfortable  Respiratory system: Clear to auscultation. Respiratory effort normal. Cardiovascular system: S1 & S2 heard, RRR. No JVD, murmurs, rubs, gallops or clicks. No pedal edema. Gastrointestinal system: Abdomen is nondistended, soft and nontender. No organomegaly or masses felt. Normal bowel sounds heard. Central  nervous system: Alert and oriented to name only Extremities: Symmetric 5 x 5 power. Skin:  No rashes, lesions or ulcers Psychiatry: Judgement and insight appear poor

## 2023-10-27 NOTE — Progress Notes (Signed)
 PROGRESS NOTE   Heidi Fuentes  FMW:[id number] DOB: 1945-04-16 DOA: 10/25/2023 PCP: [name], DO   Chief Complaint  Patient presents with   Altered Mental Status   Level of care: Telemetry  Brief Admission History:  78 y.o. female with medical history significant for asthma, bipolar disorder, hypertension, chronic pain, and bronchiectasis presented with altered mental status.  At the time of evaluation by hospitalist on admission, patient was awake and alert and oriented to person but not time.  She was slightly tachycardic, tachypneic.  UA not suggestive of UTI.  Ammonia 18.  Head CT negative for acute abnormality.  Portable chest x-ray unremarkable.  MRI of brain was negative for any acute abnormality.    Assessment and Plan:  Acute possible metabolic or toxic encephalopathy -Unclear cause of altered mental status.  Mental status quickly improving but still disoriented.  CT and MRI of brain negative as above.  No focal neurologic deficit.  UA noncritical for UTI.  Respiratory clear.  Patient is on multiple psychotropic medications which can attribute to altered mental state.  Unclear which of these medications she is actually taking.  Patient apparently stopped taking Lamictal  by herself.  Other psychoactive medications are currently on hold -Ammonia normal.  Check B12, folic acid  and TSH levels in AM. -Monitor mental status.  Fall precautions.  PT eval   Hypothyroidism -Continue levothyroxine    Hypertension -Blood pressure stable.  Continue metoprolol    GERD Continue Protonix    COPD - Continue current bronchodilators   Chronic pain - Apparently she is not taking oxycodone  anymore.  Is probably still taking gabapentin  which is currently on hold.  Outpatient follow-up with PCP and/or pain management   Anxiety/depression -Holding home regimen for now.   Physical deconditioning - PT eval--requested   Hypokalemia - Resolved  DVT prophylaxis: enoxaparin  Code  Status: Full  Family Communication:  Disposition: Status is: Inpatient    Consultants:   Procedures:   Antimicrobials:    Subjective: Pt agitated intermittently   Objective: Vitals:   10/27/23 0322 10/27/23 0500 10/27/23 0523 10/27/23 0830  BP: (!) 153/92  (!) 153/92   Pulse: 68  63   Resp:   16   Temp: 99.2 F (37.3 C)  99.2 F (37.3 C)   TempSrc: Oral  Oral   SpO2: 93%  93% 97%  Weight:  50.9 kg    Height:        Intake/Output Summary (Last 24 hours) at 10/27/2023 1700 Last data filed at 10/27/2023 1300 Gross per 24 hour  Intake 360 ml  Output 400 ml  Net -40 ml   Filed Weights   10/25/23 1457 10/27/23 0500  Weight: 52 kg 50.9 kg   Examination:  General exam: Appears calm and comfortable  Respiratory system: Clear to auscultation. Respiratory effort normal. Cardiovascular system: normal S1 & S2 heard. No JVD, murmurs, rubs, gallops or clicks. No pedal edema. Gastrointestinal system: Abdomen is nondistended, soft and nontender. No organomegaly or masses felt. Normal bowel sounds heard. Central nervous system: Alert and disoriented. No focal neurological deficits. Extremities: Symmetric 5 x 5 power. Skin: No rashes, lesions or ulcers. Psychiatry: Judgement and insight appear severely diminished. Mood & affect labile.   Data Reviewed: I have personally reviewed following labs and imaging studies  CBC: Recent Labs  Lab 10/25/23 1513 10/27/23 0454  WBC 8.1 7.8  NEUTROABS 4.1 3.6  HGB 14.5 14.3  HCT 43.2 42.0  MCV 96.9 96.6  PLT 274 266    Basic Metabolic  Panel: Recent Labs  Lab 10/25/23 1513 10/26/23 0420 10/27/23 0454  NA 137 139 139  K 3.3* 3.5 4.0  CL 99 106 104  CO2 23 22 24   GLUCOSE 116* 81 85  BUN 25* 23 16  CREATININE 0.70 0.67 0.59  CALCIUM  9.2 8.6* 8.9  MG  --   --  2.0    CBG: No results for input(s): GLUCAP in the last 168 hours.  No results found for this or any previous visit (from the past 240 hours).   Radiology  Studies: MR BRAIN WO CONTRAST Result Date: 10/25/2023 CLINICAL DATA:  Provided history: Mental status change, unknown cause. EXAM: MRI HEAD WITHOUT CONTRAST TECHNIQUE: Multiplanar, multiecho pulse sequences of the brain and surrounding structures were obtained without intravenous contrast. COMPARISON:  Head CT 10/25/2023.  Brain MRI 07/07/2006. FINDINGS: Intermittently motion degraded examination. Most notably, the sagittal T1 sequence is severely motion degraded, the axial T2 sequence is moderate-to-severely motion degraded, the axial FLAIR sequence is severely motion degraded and the axial T1 sequence is moderately motion degraded. Within this limitation, findings are as follows. Brain: Generalized cerebral atrophy. Multifocal T2 FLAIR hyperintense signal abnormality within the cerebral white matter, nonspecific but compatible with moderate chronic small vessel ischemic disease. No evidence of acute infarct. No intracranial mass or extra-axial fluid collection is identified. No chronic intracranial blood products. No midline shift. Vascular: Maintained flow voids within the proximal large arterial vessels. Skull and upper cervical spine: No focal worrisome marrow lesion. Multilevel facet arthropathy within the cervical spine at the visible levels. Sinuses/Orbits: No mass or acute finding within the imaged orbits. Redemonstrated chronic, depressed fracture deformity of the left orbital floor. No significant paranasal sinus disease. IMPRESSION: 1. Significantly motion degraded examination. Within this limitation, findings are as follows. 2. No acute intracranial abnormality is identified. The diffusion-weighted imaging is of good quality and there is no evidence of an acute infarct. 3. Moderate chronic small vessel ischemic changes within the cerebral white matter. 4. Generalized cerebral atrophy. Electronically Signed   By: Rockey Childs D.O.   On: 10/25/2023 20:13    Scheduled Meds:  enoxaparin  (LOVENOX )  injection  40 mg Subcutaneous Q24H   fluticasone  furoate-vilanterol  1 puff Inhalation Daily   levothyroxine   25 mcg Oral QAC breakfast   metoprolol  tartrate  12.5 mg Oral BID   pantoprazole   40 mg Oral Daily   Continuous Infusions:   LOS: 1 day   Time spent: 55 mins  Laporche Martelle Vicci, MD How to contact the [facility] Attending or Consulting provider 7A - 7P or covering provider during after hours 7P -7A, for this patient?  Check the care team in [facility] and look for a) attending/consulting TRH provider listed and b) the TRH team listed Log into [email or web address] to find provider on call.  Locate the TRH provider you are looking for under [facility] and page to a number that you can be directly reached. If you still have difficulty reaching the provider, please page the [facility] (Director on Call) for the Hospitalists listed on amion for assistance.  10/27/2023, 5:00 PM

## 2023-10-27 NOTE — Plan of Care (Signed)
  Problem: Clinical Measurements: Goal: Will remain free from infection Outcome: Progressing   Problem: Activity: Goal: Risk for activity intolerance will decrease Outcome: Progressing   Problem: Coping: Goal: Level of anxiety will decrease Outcome: Progressing   Problem: Elimination: Goal: Will not experience complications related to urinary retention Outcome: Progressing   Problem: Pain Managment: Goal: General experience of comfort will improve and/or be controlled Outcome: Progressing   Problem: Safety: Goal: Ability to remain free from injury will improve Outcome: Progressing   Problem: Skin Integrity: Goal: Risk for impaired skin integrity will decrease Outcome: Progressing

## 2023-10-28 DIAGNOSIS — F1190 Opioid use, unspecified, uncomplicated: Secondary | ICD-10-CM | POA: Diagnosis not present

## 2023-10-28 DIAGNOSIS — F3131 Bipolar disorder, current episode depressed, mild: Secondary | ICD-10-CM | POA: Diagnosis not present

## 2023-10-28 DIAGNOSIS — E039 Hypothyroidism, unspecified: Secondary | ICD-10-CM | POA: Diagnosis not present

## 2023-10-28 DIAGNOSIS — G9340 Encephalopathy, unspecified: Secondary | ICD-10-CM | POA: Diagnosis not present

## 2023-10-28 MED ORDER — FLUOXETINE HCL 20 MG PO CAPS
40.0000 mg | ORAL_CAPSULE | Freq: Every day | ORAL | Status: DC
  Administered 2023-10-28 – 2023-11-26 (×25): 40 mg via ORAL
  Filled 2023-10-28 (×24): qty 2
  Filled 2023-10-28: qty 4
  Filled 2023-10-28 (×5): qty 2

## 2023-10-28 MED ORDER — BUPROPION HCL 75 MG PO TABS
75.0000 mg | ORAL_TABLET | Freq: Two times a day (BID) | ORAL | Status: DC
  Administered 2023-10-28 – 2023-10-31 (×3): 75 mg via ORAL
  Filled 2023-10-28 (×17): qty 1

## 2023-10-28 MED ORDER — MELATONIN 3 MG PO TABS
3.0000 mg | ORAL_TABLET | Freq: Every day | ORAL | Status: DC
Start: 2023-10-28 — End: 2023-11-26
  Administered 2023-10-28 – 2023-11-25 (×23): 3 mg via ORAL
  Filled 2023-10-28 (×31): qty 1

## 2023-10-28 MED ORDER — MONTELUKAST SODIUM 10 MG PO TABS
5.0000 mg | ORAL_TABLET | Freq: Every day | ORAL | Status: DC
  Administered 2023-10-28 – 2023-10-30 (×3): 5 mg via ORAL
  Filled 2023-10-28 (×6): qty 1

## 2023-10-28 MED ORDER — LOSARTAN POTASSIUM 25 MG PO TABS
25.0000 mg | ORAL_TABLET | Freq: Every day | ORAL | Status: DC
Start: 2023-10-28 — End: 2023-11-04
  Administered 2023-10-28 – 2023-10-31 (×2): 25 mg via ORAL
  Filled 2023-10-28 (×8): qty 1

## 2023-10-28 MED ORDER — GABAPENTIN 300 MG PO CAPS
300.0000 mg | ORAL_CAPSULE | Freq: Three times a day (TID) | ORAL | Status: DC
Start: 2023-10-28 — End: 2023-11-11
  Administered 2023-10-28 – 2023-11-11 (×27): 300 mg via ORAL
  Filled 2023-10-28 (×37): qty 1

## 2023-10-28 MED ORDER — LATANOPROST 0.005 % OP SOLN
1.0000 [drp] | Freq: Every day | OPHTHALMIC | Status: DC
  Administered 2023-10-28 – 2023-11-25 (×25): 1 [drp] via OPHTHALMIC
  Filled 2023-10-28 (×3): qty 2.5

## 2023-10-28 NOTE — Plan of Care (Signed)
  Problem: Education: Goal: Knowledge of General Education information will improve Description: Including pain rating scale, medication(s)/side effects and non-pharmacologic comfort measures Outcome: Not Progressing Variance Physical/mental limitations Impact: High   Problem: Safety: Goal: Ability to remain free from injury will improve Outcome: Progressing   Problem: Skin Integrity: Goal: Risk for impaired skin integrity will decrease Outcome: Progressing

## 2023-10-28 NOTE — Progress Notes (Signed)
 PT Cancellation Note  Patient Details Name: Heidi Fuentes MRN: [id number] DOB: 06/17/1945   Cancelled Treatment:    Reason Eval/Treat Not Completed: Other (comment) (Pt unavailable at this time secondary to nursing staff cleaning pt)   Lacinda Fass, PT, DPT  10/28/2023, 3:14 PM

## 2023-10-28 NOTE — TOC Progression Note (Addendum)
 Transition of Care [facility]) - Progression Note    Patient Details  Name: Heidi Fuentes MRN: [id number] Date of Birth: 1945/05/29  Transition of [facility]) CM/SW Contact  Hoy DELENA Bigness, LCSW Phone Number: 10/28/2023, 10:36 AM  Clinical Narrative:    CSW left voicemail for pt's APS SW, Glendia Richters at [phone]. Awaiting return call.   ADDENDUM: Spoke with [facility] APS supervisor, [place] [phone]) who shares that they have completed theit APS investigation and deemed that pt does need a legal guardian and recommend that [facility] become pt's legal guardian as no friend/family is willing to become guardian. Interim [place] Paperwork is being filed with the [employer]. Will need to follow up with [facility] on the status of guardianship.   Expected Discharge Plan: Skilled Nursing Facility Barriers to Discharge: Continued Medical Work up               Expected Discharge Plan and Services In-house Referral: Clinical Social Work Discharge Planning Services: NA Post Acute Care Choice: Nursing Home, Skilled Nursing Facility Living arrangements for the past 2 months: Single Family Home                                       Social Drivers of Health (SDOH) Interventions SDOH Screenings   Food Insecurity: No Food Insecurity (10/25/2023)  Housing: Low Risk  (10/25/2023)  Transportation Needs: No Transportation Needs (10/25/2023)  Utilities: Not At Risk (10/25/2023)  Social Connections: Unknown (10/25/2023)  Tobacco Use: Low Risk  (10/25/2023)    Readmission Risk Interventions    10/28/2023   10:36 AM 03/07/2023    2:31 PM 11/10/2022   11:05 AM  Readmission Risk Prevention Plan  Transportation Screening Complete Complete Complete  PCP or Specialist Appt within 5-7 Days Complete    Home Care Screening Complete    Medication Review (RN CM) Complete    Medication Review (RN Care Manager)  Complete Complete  PCP or Specialist appointment  within 3-5 days of discharge   Not Complete  HRI or Home Care Consult  Complete Complete  SW Recovery Care/Counseling Consult  Complete Complete  Palliative Care Screening  Not Applicable Not Applicable  Skilled Nursing Facility  -- Not Complete

## 2023-10-28 NOTE — Progress Notes (Signed)
 PROGRESS NOTE   Heidi Fuentes  FMW:[id number] DOB: March 21, 1945 DOA: 10/25/2023 PCP: [name], DO   Chief Complaint  Patient presents with   Altered Mental Status   Level of care: Telemetry  Brief Admission History:  78 y.o. female with medical history significant for asthma, bipolar disorder, hypertension, chronic pain, and bronchiectasis presented with altered mental status.  At the time of evaluation by hospitalist on admission, patient was awake and alert and oriented to person but not time.  She was slightly tachycardic, tachypneic.  UA not suggestive of UTI.  Ammonia 18.  Head CT negative for acute abnormality.  Portable chest x-ray unremarkable.  MRI of brain was negative for any acute abnormality.    Assessment and Plan:  Acute possible metabolic or toxic encephalopathy -Unclear cause of altered mental status.  Mental status quickly improving but still disoriented.  CT and MRI of brain negative as above.  No focal neurologic deficit.  UA noncritical for UTI.  Respiratory clear.  Patient is on multiple psychotropic medications which can attribute to altered mental state.  Unclear which of these medications she is actually taking.  Patient apparently stopped taking Lamictal  by herself.  Other psychoactive medications are currently on hold -Ammonia normal.  B12 (286), folic acid  (17.6) and TSH (1.425). -Monitor mental status.  Fall precautions.  PT eval - with recommendation for SNF placement, TOC consulted and requested to assist with placement.    Hypothyroidism -Continue levothyroxine    Hypertension -Blood pressure stable.  Continue metoprolol , losartan    GERD Continue Protonix    COPD - Continue current bronchodilators   Chronic pain - Apparently she is not taking oxycodone  anymore. Reduced dose of gabapentin   Outpatient follow-up with PCP and/or pain management   Anxiety/depression -carefully restarting home meds   Physical deconditioning - PT eval--requested  and recommendation is for SNF   Hypokalemia - Resolved  DVT prophylaxis: enoxaparin  Code Status: Full  Family Communication:  Disposition: Status is: Inpatient   Consultants:   Procedures:   Antimicrobials:    Subjective: Pt remains disoriented and having confabulated speech  Objective: Vitals:   10/28/23 0513 10/28/23 0759 10/28/23 1000 10/28/23 1241  BP:   121/65 130/67  Pulse:   71 76  Resp:      Temp:   (!) 97.5 F (36.4 C) 98.1 F (36.7 C)  TempSrc:   Axillary Oral  SpO2:  95% 97% 98%  Weight: 50.4 kg     Height:        Intake/Output Summary (Last 24 hours) at 10/28/2023 1540 Last data filed at 10/28/2023 0600 Gross per 24 hour  Intake --  Output 200 ml  Net -200 ml   Filed Weights   10/25/23 1457 10/27/23 0500 10/28/23 0513  Weight: 52 kg 50.9 kg 50.4 kg   Examination:  General exam: Appears calm and comfortable  Respiratory system: Clear to auscultation. Respiratory effort normal. Cardiovascular system: normal S1 & S2 heard. No JVD, murmurs, rubs, gallops or clicks. No pedal edema. Gastrointestinal system: Abdomen is nondistended, soft and nontender. No organomegaly or masses felt. Normal bowel sounds heard. Central nervous system: Alert and disoriented. No focal neurological deficits. Extremities: Symmetric 5 x 5 power. Skin: No rashes, lesions or ulcers. Psychiatry: Judgement and insight appear severely diminished. Mood & affect labile.   Data Reviewed: I have personally reviewed following labs and imaging studies  CBC: Recent Labs  Lab 10/25/23 1513 10/27/23 0454  WBC 8.1 7.8  NEUTROABS 4.1 3.6  HGB 14.5 14.3  HCT 43.2 42.0  MCV 96.9 96.6  PLT 274 266    Basic Metabolic Panel: Recent Labs  Lab 10/25/23 1513 10/26/23 0420 10/27/23 0454  NA 137 139 139  K 3.3* 3.5 4.0  CL 99 106 104  CO2 23 22 24   GLUCOSE 116* 81 85  BUN 25* 23 16  CREATININE 0.70 0.67 0.59  CALCIUM  9.2 8.6* 8.9  MG  --   --  2.0    CBG: No results for  input(s): GLUCAP in the last 168 hours.  No results found for this or any previous visit (from the past 240 hours).   Radiology Studies: No results found.   Scheduled Meds:  enoxaparin  (LOVENOX ) injection  40 mg Subcutaneous Q24H   fluticasone  furoate-vilanterol  1 puff Inhalation Daily   levothyroxine   25 mcg Oral QAC breakfast   metoprolol  tartrate  12.5 mg Oral BID   pantoprazole   40 mg Oral Daily   Continuous Infusions:   LOS: 2 days   Time spent: 55 mins  Santa Abdelrahman Vicci, MD How to contact the [facility] Attending or Consulting provider 7A - 7P or covering provider during after hours 7P -7A, for this patient?  Check the care team in [facility] and look for a) attending/consulting TRH provider listed and b) the TRH team listed Log into [email or web address] to find provider on call.  Locate the TRH provider you are looking for under [facility] and page to a number that you can be directly reached. If you still have difficulty reaching the provider, please page the [facility] (Director on Call) for the Hospitalists listed on amion for assistance.  10/28/2023, 3:40 PM

## 2023-10-28 NOTE — Plan of Care (Signed)
  Problem: Education: Goal: Knowledge of General Education information will improve Description: Including pain rating scale, medication(s)/side effects and non-pharmacologic comfort measures 10/28/2023 2001 by Desiderio Earla CROME, RN Outcome: Not Progressing 10/28/2023 2001 by Desiderio Earla CROME, RN Outcome: Progressing   Problem: Health Behavior/Discharge Planning: Goal: Ability to manage health-related needs will improve 10/28/2023 2001 by Desiderio Earla CROME, RN Outcome: Not Progressing 10/28/2023 2001 by Desiderio Earla CROME, RN Outcome: Progressing   Problem: Clinical Measurements: Goal: Ability to maintain clinical measurements within normal limits will improve 10/28/2023 2001 by Desiderio Earla CROME, RN Outcome: Progressing 10/28/2023 2001 by Desiderio Earla CROME, RN Outcome: Progressing Goal: Will remain free from infection 10/28/2023 2001 by Desiderio Earla CROME, RN Outcome: Progressing 10/28/2023 2001 by Desiderio Earla CROME, RN Outcome: Progressing Goal: Diagnostic test results will improve 10/28/2023 2001 by Desiderio Earla CROME, RN Outcome: Progressing 10/28/2023 2001 by Desiderio Earla CROME, RN Outcome: Progressing Goal: Respiratory complications will improve 10/28/2023 2001 by Desiderio Earla CROME, RN Outcome: Progressing 10/28/2023 2001 by Desiderio Earla CROME, RN Outcome: Progressing Goal: Cardiovascular complication will be avoided 10/28/2023 2001 by Desiderio Earla CROME, RN Outcome: Progressing 10/28/2023 2001 by Desiderio Earla CROME, RN Outcome: Progressing   Problem: Activity: Goal: Risk for activity intolerance will decrease 10/28/2023 2001 by Desiderio Earla CROME, RN Outcome: Progressing 10/28/2023 2001 by Desiderio Earla CROME, RN Outcome: Progressing   Problem: Nutrition: Goal: Adequate nutrition will be maintained 10/28/2023 2001 by Desiderio Earla CROME, RN Outcome: Progressing 10/28/2023 2001 by Desiderio Earla CROME, RN Outcome: Progressing   Problem: Coping: Goal: Level of anxiety will decrease 10/28/2023 2001 by Desiderio Earla CROME, RN Outcome:  Progressing 10/28/2023 2001 by Desiderio Earla CROME, RN Outcome: Progressing   Problem: Elimination: Goal: Will not experience complications related to bowel motility 10/28/2023 2001 by Desiderio Earla CROME, RN Outcome: Progressing 10/28/2023 2001 by Desiderio Earla CROME, RN Outcome: Progressing Goal: Will not experience complications related to urinary retention 10/28/2023 2001 by Desiderio Earla CROME, RN Outcome: Progressing 10/28/2023 2001 by Desiderio Earla CROME, RN Outcome: Progressing   Problem: Pain Managment: Goal: General experience of comfort will improve and/or be controlled 10/28/2023 2001 by Desiderio Earla CROME, RN Outcome: Progressing 10/28/2023 2001 by Desiderio Earla CROME, RN Outcome: Progressing   Problem: Safety: Goal: Ability to remain free from injury will improve 10/28/2023 2001 by Desiderio Earla CROME, RN Outcome: Progressing 10/28/2023 2001 by Desiderio Earla CROME, RN Outcome: Progressing   Problem: Skin Integrity: Goal: Risk for impaired skin integrity will decrease 10/28/2023 2001 by Desiderio Earla CROME, RN Outcome: Progressing 10/28/2023 2001 by Desiderio Earla CROME, RN Outcome: Progressing

## 2023-10-29 DIAGNOSIS — G9340 Encephalopathy, unspecified: Secondary | ICD-10-CM | POA: Diagnosis not present

## 2023-10-29 DIAGNOSIS — F3131 Bipolar disorder, current episode depressed, mild: Secondary | ICD-10-CM | POA: Diagnosis not present

## 2023-10-29 DIAGNOSIS — E039 Hypothyroidism, unspecified: Secondary | ICD-10-CM | POA: Diagnosis not present

## 2023-10-29 DIAGNOSIS — F1190 Opioid use, unspecified, uncomplicated: Secondary | ICD-10-CM | POA: Diagnosis not present

## 2023-10-29 NOTE — Progress Notes (Signed)
 PROGRESS NOTE   Heidi Fuentes  FMW:[id number] DOB: [birth year] DOA: 10/25/2023 PCP: [name], DO   Chief Complaint  Patient presents with   Altered Mental Status   Level of care: Med-Surg  Brief Admission History:  78 y.o. female with medical history significant for asthma, bipolar disorder, hypertension, chronic pain, and bronchiectasis presented with altered mental status.  At the time of evaluation by hospitalist on admission, patient was awake and alert and oriented to person but not time.  She was slightly tachycardic, tachypneic.  UA not suggestive of UTI.  Ammonia 18.  Head CT negative for acute abnormality.  Portable chest x-ray unremarkable.  MRI of brain was negative for any acute abnormality.    Assessment and Plan:  Acute metabolic or toxic encephalopathy -Unclear cause of altered mental status.  Mental status quickly improving but still disoriented.  CT and MRI of brain negative as above.  No focal neurologic deficit.  UA noncritical for UTI.  Respiratory clear.  Patient is on multiple psychotropic medications which can attribute to altered mental state.  Unclear which of these medications she is actually taking.  Patient apparently stopped taking Lamictal  by herself.  Other psychoactive medications are currently on hold -Ammonia normal.  B12 (286), folic acid  (17.6) and TSH (1.425). -Monitor mental status.  Fall precautions.  PT eval - with recommendation for SNF placement, TOC consulted and requested to assist with placement.    Hypothyroidism -Continue levothyroxine    Hypertension -Blood pressure stable.  Continue metoprolol , losartan    GERD Continue Protonix    COPD - Continue current bronchodilators   Chronic pain - Apparently she is not taking oxycodone  anymore. Reduced dose of gabapentin   Outpatient follow-up with PCP and/or pain management   Anxiety/depression -carefully restarting home meds   Physical deconditioning - PT eval--requested and  recommendation is for SNF   Hypokalemia - Resolved  DVT prophylaxis: enoxaparin  Code Status: Full  Family Communication:  Disposition: Status is: Inpatient   Consultants:   Procedures:   Antimicrobials:    Subjective: No significant changes, not eating well with breakfast today.    Objective: Vitals:   10/28/23 1926 10/29/23 0607 10/29/23 0819 10/29/23 1036  BP: 133/63 (!) 119/54  137/65  Pulse: 77 69  77  Resp: 17 18    Temp: (!) 97.5 F (36.4 C) (!) 97.3 F (36.3 C)    TempSrc: Oral     SpO2: 97% 98% 96% 96%  Weight:  50.3 kg    Height:        Intake/Output Summary (Last 24 hours) at 10/29/2023 1229 Last data filed at 10/29/2023 1000 Gross per 24 hour  Intake 410 ml  Output --  Net 410 ml   Filed Weights   10/27/23 0500 10/28/23 0513 10/29/23 0607  Weight: 50.9 kg 50.4 kg 50.3 kg   Examination:  General exam: Appears calm and comfortable  Respiratory system: Clear to auscultation. Respiratory effort normal. Cardiovascular system: normal S1 & S2 heard. No JVD, murmurs, rubs, gallops or clicks. No pedal edema. Gastrointestinal system: Abdomen is nondistended, soft and nontender. No organomegaly or masses felt. Normal bowel sounds heard. Central nervous system: Alert and disoriented. No focal neurological deficits. Extremities: Symmetric 5 x 5 power. Skin: No rashes, lesions or ulcers. Psychiatry: Judgement and insight appear severely diminished. Mood & affect labile.   Data Reviewed: I have personally reviewed following labs and imaging studies  CBC: Recent Labs  Lab 10/25/23 1513 10/27/23 0454  WBC 8.1 7.8  NEUTROABS 4.1 3.6  HGB 14.5 14.3  HCT 43.2 42.0  MCV 96.9 96.6  PLT 274 266    Basic Metabolic Panel: Recent Labs  Lab 10/25/23 1513 10/26/23 0420 10/27/23 0454  NA 137 139 139  K 3.3* 3.5 4.0  CL 99 106 104  CO2 23 22 24   GLUCOSE 116* 81 85  BUN 25* 23 16  CREATININE 0.70 0.67 0.59  CALCIUM  9.2 8.6* 8.9  MG  --   --  2.0     CBG: No results for input(s): GLUCAP in the last 168 hours.  No results found for this or any previous visit (from the past 240 hours).   Radiology Studies: No results found.   Scheduled Meds:  buPROPion   75 mg Oral BID   enoxaparin  (LOVENOX ) injection  40 mg Subcutaneous Q24H   FLUoxetine   40 mg Oral Daily   fluticasone  furoate-vilanterol  1 puff Inhalation Daily   gabapentin   300 mg Oral TID   latanoprost   1 drop Both Eyes QHS   levothyroxine   25 mcg Oral QAC breakfast   losartan   25 mg Oral Daily   melatonin  3 mg Oral QHS   metoprolol  tartrate  12.5 mg Oral BID   montelukast   5 mg Oral QHS   pantoprazole   40 mg Oral Daily   Continuous Infusions:   LOS: 3 days   Time spent: 55 mins  Yardley Beltran Vicci, MD How to contact the [facility] Attending or Consulting provider 7A - 7P or covering provider during after hours 7P -7A, for this patient?  Check the care team in [facility] and look for a) attending/consulting TRH provider listed and b) the TRH team listed Log into [email or web address] to find provider on call.  Locate the TRH provider you are looking for under [facility] and page to a number that you can be directly reached. If you still have difficulty reaching the provider, please page the [facility] (Director on Call) for the Hospitalists listed on amion for assistance.  10/29/2023, 12:29 PM

## 2023-10-29 NOTE — Progress Notes (Signed)
 Physical Therapy Treatment Patient Details Name: Heidi Fuentes MRN: [id number] DOB: [birth year] Today's Date: 10/29/2023   History of Present Illness Heidi Fuentes is a 78 y.o. female with medical history significant for asthma, bipolar disorder, hypertension, chronic pain, and bronchiectasis.  Patient was brought to the ED via EMS for reports of altered mental status.  Patient is alone but has a caregiver, she called patient's son the patient was not her normal self, and he said to bring patient to the ED.  The time of my evaluation, patient is awake and alert and oriented to person, but not time, she is unable to tell me why she is here. She appears to answer other questions appropriately.  She denies difficulty breathing, no cough, no vomiting or diarrhea, no weakness of her extremities, endorses good oral intake.     She was hospitalized back in February- 2/1 to 2/6 also for altered mental status thought secondary to COVID infection and  hypoxia.    PT Comments  Patient requires repeated verbal/tactile cueing for participating with therapy.  Patient demonstrates slow labored movement for sitting up at bedside, once seated able to maintain sitting balance and tolerated taking a few side steps before having to sit due to fatigue, weakness and confusion. Patient tolerated sitting up in chair after therapy - nursing staff aware. Patient will benefit from continued skilled physical therapy in hospital and recommended venue below to increase strength, balance, endurance for safe ADLs and gait.       If plan is discharge home, recommend the following: A little help with walking and/or transfers;A little help with bathing/dressing/bathroom;Assistance with cooking/housework;Help with stairs or ramp for entrance;Assist for transportation;Supervision due to cognitive status;Direct supervision/assist for medications management   Can travel by private vehicle     Yes  Equipment Recommendations  None  recommended by PT    Recommendations for Other Services       Precautions / Restrictions Precautions Precautions: Fall Recall of Precautions/Restrictions: Impaired Restrictions Weight Bearing Restrictions Per Provider Order: No     Mobility  Bed Mobility Overal bed mobility: Needs Assistance Bed Mobility: Supine to Sit     Supine to sit: Mod assist     General bed mobility comments: increased time, labored movement    Transfers Overall transfer level: Needs assistance Equipment used: Rolling walker (2 wheels) Transfers: Sit to/from Stand, Bed to chair/wheelchair/BSC Sit to Stand: Min assist, Mod assist   Step pivot transfers: Min assist, Mod assist       General transfer comment: able to use RW to trasnfer to chair after repeated verbal/tactile cueing    Ambulation/Gait Ambulation/Gait assistance: Mod assist Gait Distance (Feet): 4 Feet Assistive device: Rolling walker (2 wheels) Gait Pattern/deviations: Decreased step length - right, Decreased step length - left, Decreased stride length, Shuffle Gait velocity: slow     General Gait Details: limited to a few slow labored unsteady side step before having to sit due to fatigue   Stairs             Wheelchair Mobility     Tilt Bed    Modified Rankin (Stroke Patients Only)       Balance Overall balance assessment: Needs assistance Sitting-balance support: Feet supported, No upper extremity supported Sitting balance-Leahy Scale: Fair Sitting balance - Comments: seated EOB   Standing balance support: Reliant on assistive device for balance, During functional activity, Bilateral upper extremity supported Standing balance-Leahy Scale: Poor Standing balance comment: using RW  Communication Communication Factors Affecting Communication: Difficulty expressing self  Cognition Arousal: Alert Behavior During Therapy: Impulsive   PT - Cognitive impairments:  Problem solving, Safety/Judgement, Initiation   Orientation impairments: Place, Time, Situation                   PT - Cognition Comments: requires repeated verbal/tactile cueing for following directions   Following commands impaired: Follows one step commands inconsistently    Cueing Cueing Techniques: Verbal cues, Gestural cues, Tactile cues  Exercises General Exercises - Lower Extremity Long Arc Quad: PROM, AAROM, Strengthening, Seated, Both, 10 reps, AROM Hip Flexion/Marching: Seated, AROM, AAROM, Strengthening, Both, 10 reps    General Comments        Pertinent Vitals/Pain Pain Assessment Pain Assessment: No/denies pain    Home Living                          Prior Function            PT Goals (current goals can now be found in the care plan section) Acute Rehab PT Goals Patient Stated Goal: Return home PT Goal Formulation: With patient Time For Goal Achievement: 11/09/23 Progress towards PT goals: Progressing toward goals    Frequency    Min 3X/week      PT Plan      Co-evaluation              AM-PAC PT 6 Clicks Mobility   Outcome Measure  Help needed turning from your back to your side while in a flat bed without using bedrails?: A Little Help needed moving from lying on your back to sitting on the side of a flat bed without using bedrails?: A Lot Help needed moving to and from a bed to a chair (including a wheelchair)?: A Lot Help needed standing up from a chair using your arms (e.g., wheelchair or bedside chair)?: A Lot Help needed to walk in hospital room?: A Lot Help needed climbing 3-5 steps with a railing? : A Lot 6 Click Score: 13    End of Session   Activity Tolerance: Patient tolerated treatment well;Patient limited by fatigue Patient left: in chair;with call bell/phone within reach Nurse Communication: Mobility status PT Visit Diagnosis: Unsteadiness on feet (R26.81);Muscle weakness (generalized)  (M62.81);Difficulty in walking, not elsewhere classified (R26.2)     Time: 1000-1030 PT Time Calculation (min) (ACUTE ONLY): 30 min  Charges:    $Therapeutic Exercise: 8-22 mins $Therapeutic Activity: 8-22 mins PT General Charges $$ ACUTE PT VISIT: 1 Visit                     2:16 PM, 10/29/23 Lynwood Music, MPT Physical Therapist with [facility] 336 [phone] office [phone] mobile phone

## 2023-10-29 NOTE — TOC Progression Note (Addendum)
 Transition of Care [facility]) - Progression Note    Patient Details  Name: AMAL RENBARGER MRN: [id number] Date of Birth: Nov 09, 1945  Transition of Care [facility]) CM/SW Contact  Noreen KATHEE [place], [place] Phone Number: 10/29/2023, 10:31 AM  Clinical Narrative:     CSW reached out to [facility] this morning and left a confidential HIPPA VM with Rosaline Hoyle for a call back. CSW will continue to follow.   Addendum 10:51 am   Rosaline return CSW call back and stated that no one from [place] [facility] has reached out to to share recommendation about [place] becoming LG. Rosaline shared that it would still be a conflict of interest with them becoming LG since patient child is a Advice worker but will have the appropriate person follow up with [place] [facility]. However, CSW did provide [place] with [place] [facility] supervisor Carmelita Mura [phone]) information , so someone could get back with CSW about next steps in regards to placement.   Expected Discharge Plan: Skilled Nursing Facility Barriers to Discharge: Continued Medical Work up- Pending Guardianship through [place] [facility].      Expected Discharge Plan and Services In-house Referral: Clinical Social Work Discharge Planning Services: NA Post Acute Care Choice: Nursing Home, Skilled Nursing Facility Living arrangements for the past 2 months: Single Family Home                                       Social Drivers of Health (SDOH) Interventions SDOH Screenings   Food Insecurity: No Food Insecurity (10/25/2023)  Housing: Low Risk  (10/25/2023)  Transportation Needs: No Transportation Needs (10/25/2023)  Utilities: Not At Risk (10/25/2023)  Social Connections: Unknown (10/25/2023)  Tobacco Use: Low Risk  (10/25/2023)    Readmission Risk Interventions    10/29/2023   10:31 AM 10/28/2023   10:36 AM 03/07/2023    2:31 PM  Readmission Risk Prevention Plan  Transportation Screening Complete Complete Complete  PCP or Specialist  Appt within 5-7 Days  Complete   Home Care Screening Complete Complete   Medication Review (RN CM) Complete Complete   Medication Review (RN Care Manager)   Complete  HRI or Home Care Consult   Complete  SW Recovery Care/Counseling Consult   Complete  Palliative Care Screening   Not Applicable  Skilled Nursing Facility   --

## 2023-10-29 NOTE — Plan of Care (Signed)
 Patient is pleasantly confused and interacts with nursing staff well. Difficult to persuade to take medications and spits them out when crushed and given through applesauce.

## 2023-10-30 DIAGNOSIS — F3131 Bipolar disorder, current episode depressed, mild: Secondary | ICD-10-CM | POA: Diagnosis not present

## 2023-10-30 DIAGNOSIS — G9340 Encephalopathy, unspecified: Secondary | ICD-10-CM | POA: Diagnosis not present

## 2023-10-30 DIAGNOSIS — E039 Hypothyroidism, unspecified: Secondary | ICD-10-CM | POA: Diagnosis not present

## 2023-10-30 NOTE — Plan of Care (Signed)

## 2023-10-30 NOTE — Progress Notes (Signed)
 PROGRESS NOTE   Heidi Fuentes  FMW:[id number] DOB: May 12, 1945 DOA: 10/25/2023 PCP: Heidi Mariano SQUIBB, DO   Chief Complaint  Patient presents with   Altered Mental Status   Level of care: Med-Surg  Brief Admission History:  78 y.o. female with medical history significant for asthma, bipolar disorder, hypertension, chronic pain, and bronchiectasis presented with altered mental status.  At the time of evaluation by hospitalist on admission, patient was awake and alert and oriented to person but not time.  She was slightly tachycardic, tachypneic.  UA not suggestive of UTI.  Ammonia 18.  Head CT negative for acute abnormality.  Portable chest x-ray unremarkable.  MRI of brain was negative for any acute abnormality.    Assessment and Plan:  Acute metabolic or toxic encephalopathy -Unclear cause of altered mental status.  Mental status quickly improving but still disoriented.  CT and MRI of brain negative as above.  No focal neurologic deficit.  UA neg for UTI.  Respiratory clear.  Patient is on multiple psychotropic medications which can attribute to altered mental state.  Unclear which of these medications she is actually taking.  Patient apparently stopped taking Lamictal  by herself.  Other psychoactive medications are currently on hold -Ammonia normal.  B12 (286), folic acid  (17.6) and TSH (1.425). -Monitor mental status.  Fall precautions.  PT eval - with recommendation for SNF placement, TOC consulted and requested to assist with placement.    Hypothyroidism -Continue levothyroxine    Hypertension -Blood pressure stable.  Continue metoprolol , losartan    GERD Continue Protonix    COPD - Continue current bronchodilators   Chronic pain - Apparently she is not taking oxycodone  anymore. Reduced dose of gabapentin   Outpatient follow-up with PCP and/or pain management   Anxiety/depression -carefully restarting home meds   Physical deconditioning - PT eval--requested and recommendation  is for SNF   Hypokalemia - Resolved  DVT prophylaxis: enoxaparin  Code Status: Full  Family Communication:  Disposition: Status is: Inpatient   Consultants:   Procedures:   Antimicrobials:    Subjective: No acute changes, she won't answer questions, refuses meds at times   Objective: Vitals:   10/29/23 0819 10/29/23 1036 10/29/23 2116 10/30/23 0614  BP:  137/65 (!) 146/84 (!) 155/130  Pulse:  77 (!) 102 71  Resp:   17 18  Temp:   (!) 97.3 F (36.3 C) (!) 97.3 F (36.3 C)  TempSrc:      SpO2: 96% 96% 98% 97%  Weight:    49.8 kg  Height:        Intake/Output Summary (Last 24 hours) at 10/30/2023 1318 Last data filed at 10/30/2023 0850 Gross per 24 hour  Intake 480 ml  Output --  Net 480 ml   Filed Weights   10/28/23 0513 10/29/23 0607 10/30/23 0614  Weight: 50.4 kg 50.3 kg 49.8 kg   Examination:  General exam: Appears calm and comfortable  Respiratory system: Clear to auscultation. Respiratory effort normal. Cardiovascular system: normal S1 & S2 heard. No JVD, murmurs, rubs, gallops or clicks. No pedal edema. Gastrointestinal system: Abdomen is nondistended, soft and nontender. No organomegaly or masses felt. Normal bowel sounds heard. Central nervous system: Alert and disoriented. No focal neurological deficits. Extremities: Symmetric 5 x 5 power. Skin: No rashes, lesions or ulcers. Psychiatry: Judgement and insight appear severely diminished. Mood & affect labile.   Data Reviewed: I have personally reviewed following labs and imaging studies  CBC: Recent Labs  Lab 10/25/23 1513 10/27/23 0454  WBC 8.1 7.8  NEUTROABS 4.1 3.6  HGB 14.5 14.3  HCT 43.2 42.0  MCV 96.9 96.6  PLT 274 266    Basic Metabolic Panel: Recent Labs  Lab 10/25/23 1513 10/26/23 0420 10/27/23 0454  NA 137 139 139  K 3.3* 3.5 4.0  CL 99 106 104  CO2 23 22 24   GLUCOSE 116* 81 85  BUN 25* 23 16  CREATININE 0.70 0.67 0.59  CALCIUM  9.2 8.6* 8.9  MG  --   --  2.0     CBG: No results for input(s): GLUCAP in the last 168 hours.  No results found for this or any previous visit (from the past 240 hours).   Radiology Studies: No results found.   Scheduled Meds:  buPROPion   75 mg Oral BID   enoxaparin  (LOVENOX ) injection  40 mg Subcutaneous Q24H   FLUoxetine   40 mg Oral Daily   fluticasone  furoate-vilanterol  1 puff Inhalation Daily   gabapentin   300 mg Oral TID   latanoprost   1 drop Both Eyes QHS   levothyroxine   25 mcg Oral QAC breakfast   losartan   25 mg Oral Daily   melatonin  3 mg Oral QHS   metoprolol  tartrate  12.5 mg Oral BID   montelukast   5 mg Oral QHS   pantoprazole   40 mg Oral Daily   Continuous Infusions:   LOS: 4 days   Time spent: 55 mins  Heidi Stiggers Vicci, MD How to contact the [facility] Attending or Consulting provider 7A - 7P or covering provider during after hours 7P -7A, for this patient?  Check the care team in [facility] and look for a) attending/consulting TRH provider listed and b) the TRH team listed Log into [email or web address] to find provider on call.  Locate the TRH provider you are looking for under [facility] and page to a number that you can be directly reached. If you still have difficulty reaching the provider, please page the [facility] (Director on Call) for the Hospitalists listed on amion for assistance.  10/30/2023, 1:18 PM

## 2023-10-31 DIAGNOSIS — G9340 Encephalopathy, unspecified: Secondary | ICD-10-CM | POA: Diagnosis not present

## 2023-10-31 DIAGNOSIS — E039 Hypothyroidism, unspecified: Secondary | ICD-10-CM | POA: Diagnosis not present

## 2023-10-31 DIAGNOSIS — F3131 Bipolar disorder, current episode depressed, mild: Secondary | ICD-10-CM | POA: Diagnosis not present

## 2023-10-31 DIAGNOSIS — F1190 Opioid use, unspecified, uncomplicated: Secondary | ICD-10-CM | POA: Diagnosis not present

## 2023-10-31 MED ORDER — LAMOTRIGINE 25 MG PO TABS
25.0000 mg | ORAL_TABLET | Freq: Two times a day (BID) | ORAL | Status: DC
  Administered 2023-11-04 – 2023-11-26 (×44): 25 mg via ORAL
  Filled 2023-10-31 (×54): qty 1

## 2023-10-31 MED ORDER — CYANOCOBALAMIN 1000 MCG/ML IJ SOLN
1000.0000 ug | Freq: Once | INTRAMUSCULAR | Status: AC
  Administered 2023-11-01: 1000 ug via INTRAMUSCULAR
  Filled 2023-10-31: qty 1

## 2023-10-31 NOTE — Progress Notes (Addendum)
 PROGRESS NOTE   Heidi Fuentes  FMW:[id number] DOB: May 15, 1945 DOA: 10/25/2023 PCP: [name], DO   Chief Complaint  Patient presents with   Altered Mental Status   Level of care: Med-Surg  Brief Admission History:  78 y.o. female with medical history significant for asthma, bipolar disorder, hypertension, chronic pain, and bronchiectasis presented with altered mental status.  At the time of evaluation by hospitalist on admission, patient was awake and alert and oriented to person but not time.  She was slightly tachycardic, tachypneic.  UA not suggestive of UTI.  Ammonia 18.  Head CT negative for acute abnormality.  Portable chest x-ray unremarkable.  MRI of brain was negative for any acute abnormality.    Assessment and Plan:  Acute metabolic or toxic encephalopathy and Delirium  -Unclear cause of altered mental status.  Mental status quickly improving but still disoriented.  CT and MRI of brain negative as above.  No focal neurologic deficit.  UA neg for UTI.  Respiratory clear.  Patient is on multiple psychotropic medications which can attribute to altered mental state.  Unclear which of these medications she is actually taking.  Patient apparently stopped taking Lamictal  by herself.  Other psychoactive medications are currently on hold -Ammonia normal.  B12 (286), folic acid  (17.6) and TSH (1.425). -Monitor mental status.  Fall precautions.  PT eval - with recommendation for SNF placement, TOC consulted and requested to assist with placement. -continues to have disorganized speech  -try to restart lamictal  she was taking at home but had stopped it per reporting, will start with lower dose and see if she tolerates it   Hypothyroidism -Continue levothyroxine    Hypertension -Blood pressure stable.  Continue metoprolol , losartan    GERD Continue Protonix    COPD - Continue current bronchodilators   Chronic pain - Apparently she is not taking oxycodone  anymore. Reduced dose  of gabapentin   Outpatient follow-up with PCP and/or pain management   Anxiety/depression -carefully restarting home meds   Physical deconditioning - PT eval--requested and recommendation is for SNF   Hypokalemia - Resolved  DVT prophylaxis: enoxaparin  Code Status: Full  Family Communication:  Disposition: Status is: Inpatient   Consultants:   Procedures:   Antimicrobials:    Subjective: Pt trying to eat but remains very disoriented   Objective: Vitals:   10/30/23 1422 10/30/23 2004 10/31/23 0556 10/31/23 1158  BP: 128/60 (!) 159/82 (!) 180/85 (!) 131/57  Pulse: 95 86 85 72  Resp: 18 18 16 15   Temp: 98.7 F (37.1 C) 98.2 F (36.8 C) (!) 97.5 F (36.4 C) 98.5 F (36.9 C)  TempSrc: Oral Oral Axillary Oral  SpO2: 94% 94% 96% 96%  Weight:   51.5 kg   Height:        Intake/Output Summary (Last 24 hours) at 10/31/2023 1259 Last data filed at 10/31/2023 0500 Gross per 24 hour  Intake 360 ml  Output --  Net 360 ml   Filed Weights   10/29/23 0607 10/30/23 0614 10/31/23 0556  Weight: 50.3 kg 49.8 kg 51.5 kg   Examination:  General exam: Appears calm and comfortable  Respiratory system: Clear to auscultation. Respiratory effort normal. Cardiovascular system: normal S1 & S2 heard. No JVD, murmurs, rubs, gallops or clicks. No pedal edema. Gastrointestinal system: Abdomen is nondistended, soft and nontender. No organomegaly or masses felt. Normal bowel sounds heard. Central nervous system: Alert and disoriented. No focal neurological deficits. Extremities: Symmetric 5 x 5 power. Skin: No rashes, lesions or ulcers. Psychiatry: Judgement and  insight appear severely diminished. Mood & affect labile.   Data Reviewed: I have personally reviewed following labs and imaging studies  CBC: Recent Labs  Lab 10/25/23 1513 10/27/23 0454  WBC 8.1 7.8  NEUTROABS 4.1 3.6  HGB 14.5 14.3  HCT 43.2 42.0  MCV 96.9 96.6  PLT 274 266    Basic Metabolic Panel: Recent Labs   Lab 10/25/23 1513 10/26/23 0420 10/27/23 0454  NA 137 139 139  K 3.3* 3.5 4.0  CL 99 106 104  CO2 23 22 24   GLUCOSE 116* 81 85  BUN 25* 23 16  CREATININE 0.70 0.67 0.59  CALCIUM  9.2 8.6* 8.9  MG  --   --  2.0    CBG: No results for input(s): GLUCAP in the last 168 hours.  No results found for this or any previous visit (from the past 240 hours).   Radiology Studies: No results found.   Scheduled Meds:  buPROPion   75 mg Oral BID   enoxaparin  (LOVENOX ) injection  40 mg Subcutaneous Q24H   FLUoxetine   40 mg Oral Daily   fluticasone  furoate-vilanterol  1 puff Inhalation Daily   gabapentin   300 mg Oral TID   latanoprost   1 drop Both Eyes QHS   levothyroxine   25 mcg Oral QAC breakfast   losartan   25 mg Oral Daily   melatonin  3 mg Oral QHS   metoprolol  tartrate  12.5 mg Oral BID   montelukast   5 mg Oral QHS   pantoprazole   40 mg Oral Daily   Continuous Infusions:   LOS: 5 days   Time spent: 55 mins  Beata Beason Vicci, MD How to contact the [facility] Attending or Consulting provider 7A - 7P or covering provider during after hours 7P -7A, for this patient?  Check the care team in [facility] and look for a) attending/consulting TRH provider listed and b) the TRH team listed Log into [email or web address] to find provider on call.  Locate the TRH provider you are looking for under [facility] and page to a number that you can be directly reached. If you still have difficulty reaching the provider, please page the [facility] (Director on Call) for the Hospitalists listed on amion for assistance.  10/31/2023, 12:59 PM

## 2023-10-31 NOTE — Progress Notes (Deleted)
 Offered night time medications crushed in strawberry ice cream. Pt spit them out and refused. Refused eye drops as well. Only med I was able to give successfully was Lovenox  shot at 2000. MD Arrien made aware.

## 2023-10-31 NOTE — Plan of Care (Signed)

## 2023-11-01 DIAGNOSIS — R410 Disorientation, unspecified: Secondary | ICD-10-CM

## 2023-11-01 DIAGNOSIS — F3131 Bipolar disorder, current episode depressed, mild: Secondary | ICD-10-CM | POA: Diagnosis not present

## 2023-11-01 DIAGNOSIS — Z8659 Personal history of other mental and behavioral disorders: Secondary | ICD-10-CM

## 2023-11-01 DIAGNOSIS — E039 Hypothyroidism, unspecified: Secondary | ICD-10-CM | POA: Diagnosis not present

## 2023-11-01 DIAGNOSIS — G9340 Encephalopathy, unspecified: Secondary | ICD-10-CM | POA: Diagnosis not present

## 2023-11-01 MED ORDER — LORAZEPAM 2 MG/ML IJ SOLN
0.5000 mg | Freq: Once | INTRAMUSCULAR | Status: AC
  Administered 2023-11-01: 0.5 mg via INTRAVENOUS
  Filled 2023-11-01: qty 1

## 2023-11-01 MED ORDER — SODIUM CHLORIDE 0.9 % IV BOLUS
1000.0000 mL | Freq: Once | INTRAVENOUS | Status: AC
  Administered 2023-11-01: 1000 mL via INTRAVENOUS

## 2023-11-01 MED ORDER — HALOPERIDOL LACTATE 5 MG/ML IJ SOLN
1.0000 mg | Freq: Four times a day (QID) | INTRAMUSCULAR | Status: DC | PRN
  Administered 2023-11-02 – 2023-11-13 (×4): 1 mg via INTRAVENOUS
  Filled 2023-11-01 (×5): qty 1

## 2023-11-01 NOTE — TOC Progression Note (Signed)
 Transition of Care [facility]) - Progression Note    Patient Details  Name: Heidi Fuentes MRN: [id number] Date of Birth: Feb 09, 1945  Transition of Care [facility]) CM/SW Contact  Lucie Lunger, [place] Phone Number: 11/01/2023, 10:15 AM  Clinical Narrative:    CSW left voicemail's for both Kaylee with [place] [facility] and Rosaline with [facility] requesting call back when able. TOC continues to await updates on guardianship, last update provided by APS was that guardianship was going to be petitioned to the clerk of courts. TOC to follow.   Expected Discharge Plan: Skilled Nursing Facility Barriers to Discharge: Continued Medical Work up, Other (must enter comment) (DSS obtaining guardianship)  DSS Guardianship request start date: 10/27/23 DSS Service county:  [place], transferred to [place] due to conflict of interest) DSS Date of decision notification: 10/28/23 DSS Guardianship decision: Substantiated      Expected Discharge Plan and Services In-house Referral: Clinical Social Work Discharge Planning Services: NA Post Acute Care Choice: Nursing Home, Skilled Nursing Facility Living arrangements for the past 2 months: Single Family Home                                       Social Drivers of Health (SDOH) Interventions SDOH Screenings   Food Insecurity: No Food Insecurity (10/25/2023)  Housing: Low Risk  (10/25/2023)  Transportation Needs: No Transportation Needs (10/25/2023)  Utilities: Not At Risk (10/25/2023)  Social Connections: Unknown (10/25/2023)  Tobacco Use: Low Risk  (10/25/2023)    Readmission Risk Interventions    10/29/2023   10:31 AM 10/28/2023   10:36 AM 03/07/2023    2:31 PM  Readmission Risk Prevention Plan  Transportation Screening Complete Complete Complete  PCP or Specialist Appt within 5-7 Days  Complete   Home Care Screening Complete Complete   Medication Review (RN CM) Complete Complete   Medication Review (RN Care Manager)   Complete  HRI or  Home Care Consult   Complete  SW Recovery Care/Counseling Consult   Complete  Palliative Care Screening   Not Applicable  Skilled Nursing Facility   --

## 2023-11-01 NOTE — Progress Notes (Signed)
 Physical Therapy Treatment Patient Details Name: Heidi Fuentes MRN: [id number] DOB: 1945-09-28 Today's Date: 11/01/2023   History of Present Illness Heidi Fuentes is a 78 y.o. female with medical history significant for asthma, bipolar disorder, hypertension, chronic pain, and bronchiectasis.  Patient was brought to the ED via EMS for reports of altered mental status.  Patient is alone but has a caregiver, she called patient's son the patient was not her normal self, and he said to bring patient to the ED.  The time of my evaluation, patient is awake and alert and oriented to person, but not time, she is unable to tell me why she is here. She appears to answer other questions appropriately.  She denies difficulty breathing, no cough, no vomiting or diarrhea, no weakness of her extremities, endorses good oral intake.     She was hospitalized back in February- 2/1 to 2/6 also for altered mental status thought secondary to COVID infection and  hypoxia.    PT Comments  Patient agreeable for therapy and requires repeated verbal/tactile cueing for following instructions. Patient tolerated ambulating in room and transferring to/from commode in bathroom, unsteady on feet, no loss of balance and tolerated sitting up in chair after therapy - nursing staff aware. Patient will benefit from continued skilled physical therapy in hospital and recommended venue below to increase strength, balance, endurance for safe ADLs and gait.     If plan is discharge home, recommend the following: A lot of help with walking and/or transfers;A lot of help with bathing/dressing/bathroom;Help with stairs or ramp for entrance;Assistance with cooking/housework;Assist for transportation   Can travel by private vehicle     Yes  Equipment Recommendations  None recommended by PT    Recommendations for Other Services       Precautions / Restrictions Precautions Precautions: Fall Recall of Precautions/Restrictions:  Impaired Restrictions Weight Bearing Restrictions Per Provider Order: No     Mobility  Bed Mobility Overal bed mobility: Needs Assistance Bed Mobility: Supine to Sit     Supine to sit: Min assist, Mod assist     General bed mobility comments: increased time, labored movement    Transfers Overall transfer level: Needs assistance Equipment used: Rolling walker (2 wheels) Transfers: Sit to/from Stand, Bed to chair/wheelchair/BSC Sit to Stand: Min assist   Step pivot transfers: Min assist, Mod assist       General transfer comment: labored movement, repeated verbal, tactile cueing for following directions    Ambulation/Gait Ambulation/Gait assistance: Min assist, Mod assist Gait Distance (Feet): 16 Feet Assistive device: Rolling walker (2 wheels) Gait Pattern/deviations: Decreased step length - right, Decreased step length - left, Decreased stride length Gait velocity: decreased     General Gait Details: increased endurance/distance for ambulating in room with labored movement, no loss of balance, limited mostly due to c/o fatigue   Stairs             Wheelchair Mobility     Tilt Bed    Modified Rankin (Stroke Patients Only)       Balance Overall balance assessment: Needs assistance Sitting-balance support: Feet supported, No upper extremity supported Sitting balance-Leahy Scale: Fair Sitting balance - Comments: fair/good seated at EOB   Standing balance support: Reliant on assistive device for balance, During functional activity, Bilateral upper extremity supported Standing balance-Leahy Scale: Fair Standing balance comment: fair/poor using RW  Communication Communication Factors Affecting Communication: Difficulty expressing self  Cognition Arousal: Alert Behavior During Therapy: Impulsive   PT - Cognitive impairments: Problem solving, Safety/Judgement, Initiation                       PT -  Cognition Comments: requires repeated verbal/tactile cueing for following directions Following commands: Impaired Following commands impaired: Follows one step commands inconsistently    Cueing Cueing Techniques: Verbal cues, Gestural cues, Tactile cues  Exercises      General Comments        Pertinent Vitals/Pain Pain Assessment Pain Assessment: No/denies pain    Home Living                          Prior Function            PT Goals (current goals can now be found in the care plan section) Acute Rehab PT Goals Patient Stated Goal: Return home PT Goal Formulation: With patient Time For Goal Achievement: 11/09/23 Progress towards PT goals: Progressing toward goals    Frequency    Min 3X/week      PT Plan      Co-evaluation              AM-PAC PT 6 Clicks Mobility   Outcome Measure  Help needed turning from your back to your side while in a flat bed without using bedrails?: A Little Help needed moving from lying on your back to sitting on the side of a flat bed without using bedrails?: A Lot Help needed moving to and from a bed to a chair (including a wheelchair)?: A Lot Help needed standing up from a chair using your arms (e.g., wheelchair or bedside chair)?: A Little Help needed to walk in hospital room?: A Lot Help needed climbing 3-5 steps with a railing? : A Lot 6 Click Score: 14    End of Session   Activity Tolerance: Patient tolerated treatment well;Patient limited by fatigue Patient left: in chair;with call bell/phone within reach;with chair alarm set Nurse Communication: Mobility status PT Visit Diagnosis: Unsteadiness on feet (R26.81);Muscle weakness (generalized) (M62.81);Difficulty in walking, not elsewhere classified (R26.2)     Time: 0822-0852 PT Time Calculation (min) (ACUTE ONLY): 30 min  Charges:    $Gait Training: 8-22 mins $Therapeutic Activity: 8-22 mins PT General Charges $$ ACUTE PT VISIT: 1 Visit                      12:32 PM, 11/01/23 Lynwood Music, MPT Physical Therapist with [facility] [phone] office [phone] mobile phone

## 2023-11-01 NOTE — Consult Note (Signed)
 [facility] Consult Note  Patient Name: Heidi Fuentes MRN: [id number] DOB: 09-21-1945 DATE OF Consult: 11/01/2023  PRIMARY PSYCHIATRIC DIAGNOSES  1.  Delirium, likely multifactorial (acute confusional state) 2.  Major neurocognitive disorder (possible dementia) 3.  History of MDD 4. History of bipolar disorder  RECOMMENDATIONS  Patient does not meet criteria for inpatient psych admission due to: No SI/HI, no psychosis, no mania. Behavior manageable on medical floor with PRNs. Presentation consistent with delirium on top of possible dementia - primarily medical etiology. Needs SNF for rehab/medication support, not psych admission.  Medication recommendations:  PRN haloperidol /lorazepam  only if severe agitation.  Non-Medication/therapeutic recommendations:  Continue medical management and delirium workup. Neurology consult for cognitive decline. Encourage med adherence; continue antidepressants as tolerated.  Delirium precautions: reorientation, optimize sleep, hydration, PT. Plan for SNF placement for rehab and medication supervision.  Psychiatry available PRN for re-evaluation if new SI/HI, psychosis, or escalating agitation.  Communication: Treatment team members (and family members if applicable) who were involved in treatment/care discussions and planning, and with whom we spoke or engaged with via secure text/chat, include the following: patient's treatment team.  Thank you for involving us  in the care of this patient. If you have any additional questions or concerns, please call [phone] and ask for me or the provider on-call.  TELEPSYCHIATRY ATTESTATION & CONSENT  As the provider for this telehealth consult, I attest that I verified the patient's identity using two separate identifiers, introduced myself to the patient, provided my credentials, disclosed my location, and performed this encounter via a HIPAA-compliant, real-time, face-to-face, two-way, interactive  audio and video platform and with the full consent and agreement of the patient (or guardian as applicable.)  Patient physical location: [facility]. Telehealth provider physical location: home office in state of [place].  Video start time: 2151 [facility] Time) Video end time: 2213 (Central Time)  IDENTIFYING DATA  Heidi Fuentes is a 78 y.o. year-old female for whom a psychiatric consultation has been ordered by the primary provider. The patient was identified using two separate identifiers.  CHIEF COMPLAINT/REASON FOR CONSULT  "I am in the hospital to get checked out for a UTI."  HISTORY OF PRESENT ILLNESS (HPI)  Emergency department / hospital course : Family/EMS report 1-2 days of progressive confusion prior to arrival. EMS noted tachycardia. Patient distractible in ED, giving 1-2 word or irrelevant answers. Patient denied pain, N/V, fevers/chills.  ED labs: CMP notable for K 3.3 (initial), glucose 116, BUN 25. UA in ED reported ketones/protein but overall not suggestive of acute infectious process per hospitalist. CT head (10/25/2023) - no acute intracranial process; mild chronic microvascular disease. CXR/portable chest - no acute abnormality. MRI brain - no acute abnormality. Ammonia 18 (normal). B12 286, folate 17.6, TSH 1.425.  Admitting hospitalist impression: acute metabolic/toxic encephalopathy and delirium of unclear etiology; mental status improving but still disoriented. Patient has been intermittently refusing/spitting out medications. PT recommends SNF for deconditioning. Medical team requested TTS consult to evaluate need for inpatient psych. Medical stabilization is ongoing.   78-y/o woman evaluated for confusion, behavioral changes, and depressive symptoms. During interview she was disoriented to time and date, required repeated prompting, and demonstrated intermittent disorganized speech and impaired attention. When asked if she was she depressed she states "Honey, I'm  treated for depression." but denies suicidal or homicidal ideation. Nursing staff reported episodes of irritability toward staff; no physical aggression documented. Patient has refused scheduled psychiatric and medical medications at times. Collateral from family and available contacts is  limited; outpatient medication adherence is uncertain.   PAST PSYCHIATRIC HISTORY  Bipolar disorder / recurrent major depressive disorder. Prior psychiatric hospitalization (details unclear). No history of suicide attempts reported. Otherwise as per HPI above.  PAST MEDICAL HISTORY  Past Medical History:  Diagnosis Date   Arthritis    Asthma    Back pain    Bipolar disorder (HCC)    Chronic pain syndrome 07/10/2013   Depression    Duodenal papillary stenosis    Dyspnea    Esophageal dysmotility    Fibromyalgia    GERD (gastroesophageal reflux disease)    Glaucoma    H/O wheezing    History of hiatal hernia    History of palpitations    HOH (hard of hearing)    Hypertension    Hypothyroidism    Orthopnea    Pneumonia    Stenosis, spinal, lumbar    Thyroid  disease    Tubular adenoma    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(HCC)      HOME MEDICATIONS  Facility Ordered Medications  Medication   metoprolol  tartrate (LOPRESSOR ) tablet 12.5 mg   levothyroxine  (SYNTHROID ) tablet 25 mcg   fluticasone  furoate-vilanterol (BREO ELLIPTA ) 200-25 MCG/ACT 1 puff   enoxaparin  (LOVENOX ) injection 40 mg   [COMPLETED] potassium chloride  (KLOR-CON ) packet 40 mEq   acetaminophen  (TYLENOL ) tablet 650 mg   Or   acetaminophen  (TYLENOL ) suppository 650 mg   ondansetron  (ZOFRAN ) tablet 4 mg   Or   ondansetron  (ZOFRAN ) injection 4 mg   polyethylene glycol (MIRALAX  / GLYCOLAX ) packet 17 g   [EXPIRED] 0.9 % NaCl with KCl 20 mEq/ L  infusion   pantoprazole  (PROTONIX ) EC tablet 40 mg   [EXPIRED] 0.9 % NaCl with KCl 20 mEq/ L  infusion   buPROPion  (WELLBUTRIN ) tablet 75 mg   FLUoxetine  (PROZAC ) capsule 40 mg    gabapentin  (NEURONTIN ) capsule 300 mg   latanoprost  (XALATAN ) 0.005 % ophthalmic solution 1 drop   losartan  (COZAAR ) tablet 25 mg   melatonin tablet 3 mg   montelukast  (SINGULAIR ) tablet 5 mg   lamoTRIgine  (LAMICTAL ) tablet 25 mg   [COMPLETED] cyanocobalamin (VITAMIN B12) injection 1,000 mcg   [COMPLETED] sodium chloride  0.9 % bolus 1,000 mL   haloperidol  lactate (HALDOL ) injection 1 mg   [COMPLETED] LORazepam  (ATIVAN ) injection 0.5 mg   PTA Medications  Medication Sig   montelukast  (SINGULAIR ) 10 MG tablet Take 10 mg by mouth daily.   melatonin 5 MG TABS Take 5 mg by mouth at bedtime.   losartan  (COZAAR ) 25 MG tablet Take 1 tablet (25 mg total) by mouth daily.   gabapentin  (NEURONTIN ) 300 MG capsule Take 600 mg by mouth 4 (four) times daily.   buPROPion  (WELLBUTRIN ) 75 MG tablet Take 1 tablet (75 mg total) by mouth 2 (two) times daily.   SYMBICORT 160-4.5 MCG/ACT inhaler Inhale 2 puffs into the lungs in the morning and at bedtime.   VENTOLIN  HFA 108 (90 Base) MCG/ACT inhaler Inhale 1-2 puffs into the lungs every 6 (six) hours as needed for wheezing or shortness of breath.   latanoprost  (XALATAN ) 0.005 % ophthalmic solution Place 1 drop into both eyes at bedtime.   pantoprazole  (PROTONIX ) 20 MG tablet Take 20 mg by mouth daily.   levothyroxine  (SYNTHROID ) 25 MCG tablet Take 25 mcg by mouth daily before breakfast.   polycarbophil (FIBERCON) 625 MG tablet Take 1 tablet (625 mg total) by mouth daily.   metoprolol  tartrate (LOPRESSOR ) 25 MG tablet Take 0.5 tablets (12.5 mg total) by mouth  2 (two) times daily.   FLUoxetine  (PROZAC ) 40 MG capsule Take 1 capsule (40 mg total) by mouth daily. Start on 03/13/23   acetaminophen  (TYLENOL ) 500 MG tablet Take 500 mg by mouth every 6 (six) hours as needed for mild pain or moderate pain. For pain   lamoTRIgine  (LAMICTAL ) 200 MG tablet Take 200 mg by mouth 2 (two) times daily. (Patient not taking: Reported on 10/25/2023)   Lidocaine -Hydrocortisone  Ace 3-0.5  % CREA Apply pea size amount to anorectum 4 times a day as needed (Patient not taking: Reported on 10/25/2023)   Oxycodone  HCl 10 MG TABS Take 1 tablet (10 mg total) by mouth every 8 (eight) hours as needed. (Patient not taking: Reported on 10/25/2023)     ALLERGIES  Allergies  Allergen Reactions   Sulfa Antibiotics Rash and Hives   Dog Epithelium (Canis Lupus Familiaris) Other (See Comments)   Mirtazapine Other (See Comments)    HA's   Trazodone Other (See Comments)   Trazodone Hcl Other (See Comments)    strange dreams   Ziprasidone Other (See Comments)    cardiac issues   Aripiprazole Other (See Comments)    Bad dreams   Quetiapine  Palpitations   Trazodone And Nefazodone Other (See Comments)    unknown   Ziprasidone Hcl Other (See Comments)    hospitalization specifics    SOCIAL & SUBSTANCE USE HISTORY  Social History   Socioeconomic History   Marital status: Divorced    Spouse name: Not on file   Number of children: Not on file   Years of education: Not on file   Highest education level: Not on file  Occupational History   Not on file  Tobacco Use   Smoking status: Never   Smokeless tobacco: Never   Tobacco comments:    NEVER SMOKED  Vaping Use   Vaping status: Never Used  Substance and Sexual Activity   Alcohol use: No   Drug use: No   Sexual activity: Not Currently  Other Topics Concern   Not on file  Social History Narrative   Not on file   Social Drivers of Health   Financial Resource Strain: Not on file  Food Insecurity: No Food Insecurity (10/25/2023)   Hunger Vital Sign    Worried About Running Out of Food in the Last Year: Never true    Ran Out of Food in the Last Year: Never true  Transportation Needs: No Transportation Needs (10/25/2023)   PRAPARE - Transportation    Lack of Transportation (Medical): No    Lack of Transportation (Non-Medical): No  Physical Activity: Not on file  Stress: Not on file  Social Connections: Unknown (10/25/2023)    Social Connection and Isolation Panel    Frequency of Communication with Friends and Family: Patient unable to answer    Frequency of Social Gatherings with Friends and Family: Patient unable to answer    Attends Religious Services: 1 to 4 times per year    Active Member of [employer] or Organizations: Not on file    Attends Banker Meetings: 1 to 4 times per year    Marital Status: Divorced   Social History   Tobacco Use  Smoking Status Never  Smokeless Tobacco Never  Tobacco Comments   NEVER SMOKED   Social History   Substance and Sexual Activity  Alcohol Use No   Social History   Substance and Sexual Activity  Drug Use No    Additional pertinent information .  FAMILY HISTORY  Family History  Problem Relation Age of Onset   Lung cancer Mother    Heart attack Father    Bone cancer Brother    COPD Sister    Colon cancer Neg Hx    Family Psychiatric History (if known):  unknown  MENTAL STATUS EXAM (MSE)  Mental Status Exam: General Appearance: laying in bed wearing hospital gown  Orientation:  to person and place  Memory:  impaired  Concentration:  poor  Recall:  Poor  Attention  Poor  Eye Contact:  Fair  Speech:  At times disorganized, tangential; occasionally gives 1-2 word or irrelevant responses  Language:    Volume:  Normal  Mood: When asked if she was she depressed she states "Honey, I'm treated for depression."  Affect:  Subdued, congruent; intermittently mildly irritable  Thought Process:  Disorganized and tangential  Thought Content:  Disorganized at times; preoccupation with medical problems and perceived lack of support. No clear active psychosis elicited; paranoia suggested but not systematized. No SI/HI.  Suicidal Thoughts:  No  Homicidal Thoughts:  No  Judgement:  Impaired  Insight:  impaired  Psychomotor Activity:  Normal  Akathisia:  Negative  Fund of Knowledge:  Poor    Assets:  Housing Social Support  Cognition:  Impaired   ADL's:    AIMS (if indicated):       VITALS  Blood pressure (!) (P) 141/100, pulse (!) (P) 128, temperature 97.7 F (36.5 C), temperature source Oral, resp. rate 18, height 4' 11 (1.499 m), weight 48.3 kg, SpO2 97%.  LABS  Admission on 10/25/2023  Component Date Value Ref Range Status   Sodium 10/25/2023 137  135 - 145 mmol/L Final   Potassium 10/25/2023 3.3 (L)  3.5 - 5.1 mmol/L Final   Chloride 10/25/2023 99  98 - 111 mmol/L Final   CO2 10/25/2023 23  22 - 32 mmol/L Final   Glucose, Bld 10/25/2023 116 (H)  70 - 99 mg/dL Final   Glucose reference range applies only to samples taken after fasting for at least 8 hours.   BUN 10/25/2023 25 (H)  8 - 23 mg/dL Final   Creatinine, Ser 10/25/2023 0.70  0.44 - 1.00 mg/dL Final   Calcium  10/25/2023 9.2  8.9 - 10.3 mg/dL Final   Total Protein 90/77/7974 6.7  6.5 - 8.1 g/dL Final   Albumin 90/77/7974 3.9  3.5 - 5.0 g/dL Final   AST 90/77/7974 34  15 - 41 U/L Final   ALT 10/25/2023 23  0 - 44 U/L Final   Alkaline Phosphatase 10/25/2023 72  38 - 126 U/L Final   Total Bilirubin 10/25/2023 1.0  0.0 - 1.2 mg/dL Final   GFR, Estimated 10/25/2023 >60  >60 mL/min Final   Comment: (NOTE) Calculated using the CKD-EPI Creatinine Equation (2021)    Anion gap 10/25/2023 15  5 - 15 Final   Performed at [facility], [address] [place], [place] [address]   WBC 10/25/2023 8.1  4.0 - 10.5 K/uL Final   RBC 10/25/2023 4.46  3.87 - 5.11 MIL/uL Final   Hemoglobin 10/25/2023 14.5  12.0 - 15.0 g/dL Final   HCT 90/77/7974 43.2  36.0 - 46.0 % Final   MCV 10/25/2023 96.9  80.0 - 100.0 fL Final   MCH 10/25/2023 32.5  26.0 - 34.0 pg Final   MCHC 10/25/2023 33.6  30.0 - 36.0 g/dL Final   RDW 90/77/7974 12.7  11.5 - 15.5 % Final   Platelets 10/25/2023 274  150 - 400  K/uL Final   nRBC 10/25/2023 0.0  0.0 - 0.2 % Final   Neutrophils Relative % 10/25/2023 51  % Final   Neutro Abs 10/25/2023 4.1  1.7 - 7.7 K/uL Final   Lymphocytes Relative 10/25/2023 35  % Final    Lymphs Abs 10/25/2023 2.8  0.7 - 4.0 K/uL Final   Monocytes Relative 10/25/2023 12  % Final   Monocytes Absolute 10/25/2023 1.0  0.1 - 1.0 K/uL Final   Eosinophils Relative 10/25/2023 2  % Final   Eosinophils Absolute 10/25/2023 0.2  0.0 - 0.5 K/uL Final   Basophils Relative 10/25/2023 0  % Final   Basophils Absolute 10/25/2023 0.0  0.0 - 0.1 K/uL Final   Immature Granulocytes 10/25/2023 0  % Final   Abs Immature Granulocytes 10/25/2023 0.03  0.00 - 0.07 K/uL Final   Performed at [facility], [address], [place], [place] [address]   Color, Urine 10/25/2023 YELLOW  YELLOW Final   APPearance 10/25/2023 CLEAR  CLEAR Final   Specific Gravity, Urine 10/25/2023 1.027  1.005 - 1.030 Final   pH 10/25/2023 5.0  5.0 - 8.0 Final   Glucose, UA 10/25/2023 NEGATIVE  NEGATIVE mg/dL Final   Hgb urine dipstick 10/25/2023 NEGATIVE  NEGATIVE Final   Bilirubin Urine 10/25/2023 NEGATIVE  NEGATIVE Final   Ketones, ur 10/25/2023 20 (A)  NEGATIVE mg/dL Final   Protein, ur 90/77/7974 30 (A)  NEGATIVE mg/dL Final   Nitrite 90/77/7974 NEGATIVE  NEGATIVE Final   Leukocytes,Ua 10/25/2023 NEGATIVE  NEGATIVE Final   RBC / HPF 10/25/2023 0-5  0 - 5 RBC/hpf Final   WBC, UA 10/25/2023 0-5  0 - 5 WBC/hpf Final   Bacteria, UA 10/25/2023 NONE SEEN  NONE SEEN Final   Squamous Epithelial / HPF 10/25/2023 0-5  0 - 5 /HPF Final   Mucus 10/25/2023 PRESENT   Final   Performed at [facility], [address], [place], [place] [address]   Ammonia 10/25/2023 18  9 - 35 umol/L Final   Performed at [facility], [address], [place], [place] [address]   Alcohol, Ethyl (B) 10/25/2023 <15  <15 mg/dL Final   Comment: (NOTE) For medical purposes only. Performed at [facility], [address], [place], [place] [address]    Sodium 10/26/2023 139  135 - 145 mmol/L Final   Potassium 10/26/2023 3.5  3.5 - 5.1 mmol/L Final   Chloride 10/26/2023 106  98 - 111 mmol/L Final   CO2 10/26/2023 22  22 - 32 mmol/L Final   Glucose, Bld  10/26/2023 81  70 - 99 mg/dL Final   Glucose reference range applies only to samples taken after fasting for at least 8 hours.   BUN 10/26/2023 23  8 - 23 mg/dL Final   Creatinine, Ser 10/26/2023 0.67  0.44 - 1.00 mg/dL Final   Calcium  10/26/2023 8.6 (L)  8.9 - 10.3 mg/dL Final   GFR, Estimated 10/26/2023 >60  >60 mL/min Final   Comment: (NOTE) Calculated using the CKD-EPI Creatinine Equation (2021)    Anion gap 10/26/2023 11  5 - 15 Final   Performed at [facility], [address], [place], [place] [address]   WBC 10/27/2023 7.8  4.0 - 10.5 K/uL Final   RBC 10/27/2023 4.35  3.87 - 5.11 MIL/uL Final   Hemoglobin 10/27/2023 14.3  12.0 - 15.0 g/dL Final   HCT 90/75/7974 42.0  36.0 - 46.0 % Final   MCV 10/27/2023 96.6  80.0 - 100.0 fL Final   MCH  10/27/2023 32.9  26.0 - 34.0 pg Final   MCHC 10/27/2023 34.0  30.0 - 36.0 g/dL Final   RDW 90/75/7974 12.3  11.5 - 15.5 % Final   Platelets 10/27/2023 266  150 - 400 K/uL Final   nRBC 10/27/2023 0.0  0.0 - 0.2 % Final   Neutrophils Relative % 10/27/2023 47  % Final   Neutro Abs 10/27/2023 3.6  1.7 - 7.7 K/uL Final   Lymphocytes Relative 10/27/2023 36  % Final   Lymphs Abs 10/27/2023 2.8  0.7 - 4.0 K/uL Final   Monocytes Relative 10/27/2023 14  % Final   Monocytes Absolute 10/27/2023 1.1 (H)  0.1 - 1.0 K/uL Final   Eosinophils Relative 10/27/2023 3  % Final   Eosinophils Absolute 10/27/2023 0.2  0.0 - 0.5 K/uL Final   Basophils Relative 10/27/2023 0  % Final   Basophils Absolute 10/27/2023 0.0  0.0 - 0.1 K/uL Final   Immature Granulocytes 10/27/2023 0  % Final   Abs Immature Granulocytes 10/27/2023 0.03  0.00 - 0.07 K/uL Final   Performed at [facility], [address] [place], [place] [address]   Sodium 10/27/2023 139  135 - 145 mmol/L Final   Potassium 10/27/2023 4.0  3.5 - 5.1 mmol/L Final   Chloride 10/27/2023 104  98 - 111 mmol/L Final   CO2 10/27/2023 24  22 - 32 mmol/L Final   Glucose, Bld 10/27/2023 85  70 - 99 mg/dL Final    Glucose reference range applies only to samples taken after fasting for at least 8 hours.   BUN 10/27/2023 16  8 - 23 mg/dL Final   Creatinine, Ser 10/27/2023 0.59  0.44 - 1.00 mg/dL Final   Calcium  10/27/2023 8.9  8.9 - 10.3 mg/dL Final   Total Protein 90/75/7974 6.3 (L)  6.5 - 8.1 g/dL Final   Albumin 90/75/7974 3.5  3.5 - 5.0 g/dL Final   AST 90/75/7974 34  15 - 41 U/L Final   ALT 10/27/2023 23  0 - 44 U/L Final   Alkaline Phosphatase 10/27/2023 68  38 - 126 U/L Final   Total Bilirubin 10/27/2023 0.9  0.0 - 1.2 mg/dL Final   GFR, Estimated 10/27/2023 >60  >60 mL/min Final   Comment: (NOTE) Calculated using the CKD-EPI Creatinine Equation (2021)    Anion gap 10/27/2023 11  5 - 15 Final   Performed at [facility], [address] [place], [place] [address]   Magnesium  10/27/2023 2.0  1.7 - 2.4 mg/dL Final   Performed at [facility], [address], [place], [place] [address]   Vitamin B-12 10/27/2023 286  180 - 914 pg/mL Final   Comment: (NOTE) This assay is not validated for testing neonatal or myeloproliferative syndrome specimens for Vitamin B12 levels. Performed at [facility], [address], [place], [place] [address]    Folate 10/27/2023 17.6  >5.9 ng/mL Final   Performed at [facility], [address] [place], [place] [address]   TSH 10/27/2023 1.425  0.350 - 4.500 uIU/mL Final   Comment: Performed by a 3rd Generation assay with a functional sensitivity of <=0.01 uIU/mL. Performed at [facility], [address], [place], [place] [address]     PSYCHIATRIC REVIEW OF SYSTEMS (ROS)  ROS: Notable for the following relevant positive findings: ROS  Additional findings:      Musculoskeletal: No abnormal movements observed      Gait & Station: Laying/Sitting      Pain Screening: Denies  Nutrition & Dental Concerns: none reported  RISK FORMULATION/ASSESSMENT  Is the patient experiencing any suicidal or homicidal ideations: No       Explain if yes:  Protective factors  considered for safety management: access to appropriate medical care  Risk factors/concerns considered for safety management:  include advanced age (53), history of depression, bipolar disorder, cognitive impairment, chronic medical issues (UTI, asthma, hypertension), and medication nonadherence.   Is there a safety management plan with the patient and treatment team to minimize risk factors and promote protective factors: Yes           Explain: in a supervised inpatient medical setting with supports Is crisis care placement or psychiatric hospitalization recommended: No     Based on my current evaluation and risk assessment, patient is determined at this time to be at:  Suicide risk: No current ideation, plan, or intent reported. Short-term risk low; long-term risk moderate due to age, medical comorbidity, cognitive impairment.  Homicide/violence risk: No verbalized threats or history of aggression; current risk low.  Capacity / grave disability: Patient demonstrates impaired judgment and cognition but is in a supervised inpatient medical setting with supports; not currently gravely disabled to the extent that psychiatric inpatient admission is required for basic self-care given medical supervision and caregiver involvement.  *RISK ASSESSMENT Risk assessment is a dynamic process; it is possible that this patient's condition, and risk level, may change. This should be re-evaluated and managed over time as appropriate. Please re-consult psychiatric consult services if additional assistance is needed in terms of risk assessment and management. If your team decides to discharge this patient, please advise the patient how to best access emergency psychiatric services, or to call 911, if their condition worsens or they feel unsafe in any way.   Ines Hock, NP Telepsychiatry Consult Services

## 2023-11-01 NOTE — Progress Notes (Signed)
 Offered meds at 2200 and 0600 crushed in strawberry ice cream. Pt refused morning meds, and spit her night time meds out and refused. Refused eye drops as well. Only med I was able to give successfully was Lovenox  shot at 2000. MD Arrien made aware.

## 2023-11-01 NOTE — BH Assessment (Signed)
 Patient deferred to [facility]. Care Coordinator will notify the ED care team once the [facility] provider is ready and available to begin their assessment. If there are any questions, please follow up via this secure chat or contact the [facility] Care Coordinator at [phone] for updates. The [facility] ED care team has been informed of the current plan of care.

## 2023-11-01 NOTE — Progress Notes (Addendum)
 PROGRESS NOTE   Heidi Fuentes  FMW:[id number] DOB: 03-27-1945 DOA: 10/25/2023 PCP: [name], DO   Chief Complaint  Patient presents with   Altered Mental Status   Level of care: Med-Surg  Brief Admission History:  78 y.o. female with medical history significant for asthma, bipolar disorder, hypertension, chronic pain, and bronchiectasis presented with altered mental status.  At the time of evaluation by hospitalist on admission, patient was awake and alert and oriented to person but not time.  She was slightly tachycardic, tachypneic.  UA not suggestive of UTI.  Ammonia 18.  Head CT negative for acute abnormality.  Portable chest x-ray unremarkable.  MRI of brain was negative for any acute abnormality.    Assessment and Plan:  Acute metabolic or toxic encephalopathy and Delirium  -Unclear cause of altered mental status.  Mental status quickly improving but still disoriented.  CT and MRI of brain negative as above.  No focal neurologic deficit.  UA neg for UTI.  Respiratory clear.  Patient is on multiple psychotropic medications which can attribute to altered mental state.  Unclear which of these medications she is actually taking.  Patient apparently stopped taking Lamictal  by herself.  Other psychoactive medications are currently on hold -Ammonia normal.  B12 (286), folic acid  (17.6) and TSH (1.425). -Monitor mental status.  Fall precautions.  PT eval - with recommendation for SNF placement, TOC consulted and requested to assist with placement. -continues to have disorganized speech  -tried to restart lamictal  she was taking at home but had stopped it per reporting, will start with lower dose and see if she tolerates it - - pt refusing to take meds at times and spitting them out  - due to uncontrolled bipolar disorder and refusing to take oral meds, I am requesting a TTS consultation for medical management and to determine if she needs inpatient psychiatric hospitalization - pt has  been medically cleared and can have TTS consultation today if they can see her  Bipolar disorder -- uncontrolled and she is refusing to take meds, spitting them out -- TTS consult requested for medical mgmt and to determine if she needs inpatient psychiatric hospitalization   Hypothyroidism -Continue levothyroxine    Hypertension -Blood pressure stable.  Continue metoprolol , losartan    GERD Continue Protonix    COPD - Continue current bronchodilators   Chronic pain - Apparently she is not taking oxycodone  anymore. Reduced dose of gabapentin   Outpatient follow-up with PCP and/or pain management   Anxiety/depression -carefully restarting home meds   Physical deconditioning - PT eval--requested and recommendation is for SNF   Hypokalemia - Resolved  DVT prophylaxis: enoxaparin  Code Status: Full  Family Communication:  Disposition: Status is: Inpatient   Consultants:  TTS (medical mgmt and dispo)  Procedures:   Antimicrobials:    Subjective: No significant changes   Objective: Vitals:   10/31/23 2110 11/01/23 0500 11/01/23 0532 11/01/23 0536  BP: (!) 154/81  137/81 137/81  Pulse: 81  92 92  Resp:    18  Temp:   99.1 F (37.3 C) 99.1 F (37.3 C)  TempSrc:   Oral Oral  SpO2:   95% 95%  Weight:  48.3 kg    Height:        Intake/Output Summary (Last 24 hours) at 11/01/2023 1139 Last data filed at 10/31/2023 1837 Gross per 24 hour  Intake --  Output 650 ml  Net -650 ml   Filed Weights   10/30/23 0614 10/31/23 0556 11/01/23 0500  Weight: 49.8  kg 51.5 kg 48.3 kg   Examination:  General exam: Appears calm and comfortable  Respiratory system: Clear to auscultation. Respiratory effort normal. Cardiovascular system: normal S1 & S2 heard. No JVD, murmurs, rubs, gallops or clicks. No pedal edema. Gastrointestinal system: Abdomen is nondistended, soft and nontender. No organomegaly or masses felt. Normal bowel sounds heard. Central nervous system: Alert and  disoriented. No focal neurological deficits. Extremities: Symmetric 5 x 5 power. Skin: No rashes, lesions or ulcers. Psychiatry: Judgement and insight appear severely diminished. Mood & affect labile.   Data Reviewed: I have personally reviewed following labs and imaging studies  CBC: Recent Labs  Lab 10/25/23 1513 10/27/23 0454  WBC 8.1 7.8  NEUTROABS 4.1 3.6  HGB 14.5 14.3  HCT 43.2 42.0  MCV 96.9 96.6  PLT 274 266    Basic Metabolic Panel: Recent Labs  Lab 10/25/23 1513 10/26/23 0420 10/27/23 0454  NA 137 139 139  K 3.3* 3.5 4.0  CL 99 106 104  CO2 23 22 24   GLUCOSE 116* 81 85  BUN 25* 23 16  CREATININE 0.70 0.67 0.59  CALCIUM  9.2 8.6* 8.9  MG  --   --  2.0    CBG: No results for input(s): GLUCAP in the last 168 hours.  No results found for this or any previous visit (from the past 240 hours).   Radiology Studies: No results found.   Scheduled Meds:  buPROPion   75 mg Oral BID   enoxaparin  (LOVENOX ) injection  40 mg Subcutaneous Q24H   FLUoxetine   40 mg Oral Daily   fluticasone  furoate-vilanterol  1 puff Inhalation Daily   gabapentin   300 mg Oral TID   lamoTRIgine   25 mg Oral BID   latanoprost   1 drop Both Eyes QHS   levothyroxine   25 mcg Oral QAC breakfast   losartan   25 mg Oral Daily   melatonin  3 mg Oral QHS   metoprolol  tartrate  12.5 mg Oral BID   montelukast   5 mg Oral QHS   pantoprazole   40 mg Oral Daily   Continuous Infusions:   LOS: 6 days   Time spent: 35 mins  Ritha Sampedro Vicci, MD How to contact the [facility] Attending or Consulting provider 7A - 7P or covering provider during after hours 7P -7A, for this patient?  Check the care team in [facility] and look for a) attending/consulting TRH provider listed and b) the TRH team listed Log into [email or web address] to find provider on call.  Locate the TRH provider you are looking for under [facility] and page to a number that you can be directly reached. If you still have difficulty reaching the  provider, please page the [facility] (Director on Call) for the Hospitalists listed on amion for assistance.  11/01/2023, 11:39 AM

## 2023-11-01 NOTE — Plan of Care (Signed)
   Problem: Activity: Goal: Risk for activity intolerance will decrease Outcome: Progressing   Problem: Nutrition: Goal: Adequate nutrition will be maintained Outcome: Progressing   Problem: Coping: Goal: Level of anxiety will decrease Outcome: Progressing

## 2023-11-02 DIAGNOSIS — F1190 Opioid use, unspecified, uncomplicated: Secondary | ICD-10-CM | POA: Diagnosis not present

## 2023-11-02 DIAGNOSIS — E039 Hypothyroidism, unspecified: Secondary | ICD-10-CM | POA: Diagnosis not present

## 2023-11-02 DIAGNOSIS — G9340 Encephalopathy, unspecified: Secondary | ICD-10-CM | POA: Diagnosis not present

## 2023-11-02 DIAGNOSIS — F3131 Bipolar disorder, current episode depressed, mild: Secondary | ICD-10-CM | POA: Diagnosis not present

## 2023-11-02 LAB — CBC
HCT: 42.5 % (ref 36.0–46.0)
Hemoglobin: 14 g/dL (ref 12.0–15.0)
MCH: 32.5 pg (ref 26.0–34.0)
MCHC: 32.9 g/dL (ref 30.0–36.0)
MCV: 98.6 fL (ref 80.0–100.0)
Platelets: 233 K/uL (ref 150–400)
RBC: 4.31 MIL/uL (ref 3.87–5.11)
RDW: 12.1 % (ref 11.5–15.5)
WBC: 6.8 K/uL (ref 4.0–10.5)
nRBC: 0 % (ref 0.0–0.2)

## 2023-11-02 LAB — SEDIMENTATION RATE: Sed Rate: 7 mm/h (ref 0–30)

## 2023-11-02 MED ORDER — LACTATED RINGERS IV SOLN
INTRAVENOUS | Status: AC
Start: 2023-11-02 — End: 2023-11-03

## 2023-11-02 NOTE — Plan of Care (Signed)
   Problem: Activity: Goal: Risk for activity intolerance will decrease Outcome: Progressing   Problem: Nutrition: Goal: Adequate nutrition will be maintained Outcome: Progressing   Problem: Coping: Goal: Level of anxiety will decrease Outcome: Progressing

## 2023-11-02 NOTE — TOC Progression Note (Signed)
 Transition of Care [facility]) - Progression Note    Patient Details  Name: VERL WHITMORE MRN: [id number] Date of Birth: [birth year]  [facility] CM/SW Contact  Lucie Lunger, [place] Phone Number: 11/02/2023, 3:52 PM  Clinical Narrative:    CSW unable to reach DSS at this time for updates on guardianship, TOC made aware it could take 14 days at least to hear back. TOC to follow.   Expected Discharge Plan: Skilled Nursing Facility Barriers to Discharge: Unsafe home situation, DSS Guardianship  DSS Guardianship request start date: 10/27/23 DSS Service county:  [place], transferred to [place] due to conflict of interest) DSS Date of decision notification: 10/28/23 DSS Guardianship decision: Substantiated      Expected Discharge Plan and Services In-house Referral: Clinical Social Work Discharge Planning Services: NA Post Acute Care Choice: Nursing Home, Skilled Nursing Facility Living arrangements for the past 2 months: Single Family Home                                       Social Drivers of Health (SDOH) Interventions SDOH Screenings   Food Insecurity: No Food Insecurity (10/25/2023)  Housing: Low Risk  (10/25/2023)  Transportation Needs: No Transportation Needs (10/25/2023)  Utilities: Not At Risk (10/25/2023)  Social Connections: Unknown (10/25/2023)  Tobacco Use: Low Risk  (10/25/2023)    Readmission Risk Interventions    10/29/2023   10:31 AM 10/28/2023   10:36 AM 03/07/2023    2:31 PM  Readmission Risk Prevention Plan  Transportation Screening Complete Complete Complete  PCP or Specialist Appt within 5-7 Days  Complete   Home Care Screening Complete Complete   Medication Review (RN CM) Complete Complete   Medication Review (RN Care Manager)   Complete  HRI or Home Care Consult   Complete  SW Recovery Care/Counseling Consult   Complete  Palliative Care Screening   Not Applicable  Skilled Nursing Facility   --

## 2023-11-02 NOTE — Plan of Care (Signed)

## 2023-11-02 NOTE — Progress Notes (Signed)
 PROGRESS NOTE   Heidi Fuentes  FMW:[id number] DOB: [birth year] DOA: 10/25/2023 PCP: [name], DO   Chief Complaint  Patient presents with   Altered Mental Status   Level of care: Med-Surg  Brief Admission History:  78 y.o. female with medical history significant for asthma, bipolar disorder, hypertension, chronic pain, and bronchiectasis presented with altered mental status.  At the time of evaluation by hospitalist on admission, patient was awake and alert and oriented to person but not time.  She was slightly tachycardic, tachypneic.  UA not suggestive of UTI.  Ammonia 18.  Head CT negative for acute abnormality.  Portable chest x-ray unremarkable.  MRI of brain was negative for any acute abnormality.    Assessment and Plan:  Acute metabolic or toxic encephalopathy and Delirium  -Unclear cause of altered mental status.  Mental status quickly improving but still disoriented.  CT and MRI of brain negative as above.  No focal neurologic deficit.  UA neg for UTI.  Respiratory clear.  Patient is on multiple psychotropic medications which can attribute to altered mental state.  Unclear which of these medications she is actually taking.  Patient apparently stopped taking Lamictal  by herself.  Other psychoactive medications are currently on hold -Ammonia normal.  B12 (286), folic acid  (17.6) and TSH (1.425). -Monitor mental status.  Fall precautions.  PT eval - with recommendation for SNF placement, TOC consulted and requested to assist with placement. -continues to have disorganized speech  -tried to restart lamictal  she was taking at home but had stopped it per reporting, will start with lower dose and see if she tolerates it - - pt refusing to take meds at times and spitting them out  - due to uncontrolled bipolar disorder and refusing to take oral meds, I requested a TTS consultation for medical management and to determine if she needs inpatient psychiatric hospitalization - pt has been  medically cleared and can have TTS consultation today if they can see her - [facility] consult was completed on 9/29 and they indicate that inpatient psych hospitalization not required - they ordered haloperidol  and today she is very sedated; added IV fluids due to not eating/drinking  Bipolar disorder -- uncontrolled and she is refusing to take meds, spitting them out -- TTS consult requested for medical mgmt and to determine if she needs inpatient psychiatric hospitalization -- TTS deferred to [facility] team who saw patient 9/29, see consult note and recommendations   Hypothyroidism -Continue levothyroxine    Hypertension -Blood pressure stable.  Continue metoprolol , losartan  -- pt recently had been refusing to take and spitting out meds for last several days   GERD Continue Protonix    COPD - Continue current bronchodilators   Chronic pain - Apparently she is not taking oxycodone  anymore. Reduced dose of gabapentin   Outpatient follow-up with PCP and/or pain management   Anxiety/depression -carefully restarting home meds--however she has refused them, spit them out   Physical deconditioning - PT eval--requested and recommendation is for SNF - TOC working on SNF placement   Hypokalemia - Resolved  DVT prophylaxis: enoxaparin  Code Status: Full  Family Communication:  Disposition: awaiting placement   Consultants:  TTS (medical mgmt and dispo)  Procedures:   Antimicrobials:    Subjective: Pt remains somnolent after haloperidol  given overnight  Objective: Vitals:   11/01/23 2220 11/02/23 0607 11/02/23 0838 11/02/23 1426  BP: (!) 141/100 117/76  (!) 152/93  Pulse: (!) 128 73  84  Resp:  18    Temp:  97.7  F (36.5 C)  98 F (36.7 C)  TempSrc:  Oral  Oral  SpO2:  98% 95% 99%  Weight:  48.1 kg    Height:       No intake or output data in the 24 hours ending 11/02/23 1432  Filed Weights   10/31/23 0556 11/01/23 0500 11/02/23 0607  Weight: 51.5 kg 48.3 kg 48.1 kg    Examination:  General exam: Appears somnolent, arousable, NAD.  Respiratory system: Clear to auscultation. Respiratory effort normal. Cardiovascular system: normal S1 & S2 heard. No JVD, murmurs, rubs, gallops or clicks. No pedal edema. Gastrointestinal system: Abdomen is nondistended, soft and nontender. No organomegaly or masses felt. Normal bowel sounds heard. Central nervous system: somnolent, arousable. No focal neurological deficits. Extremities: Symmetric 5 x 5 power. Skin: No rashes, lesions or ulcers. Psychiatry: Judgement and insight appear severely diminished. Mood & affect labile.   Data Reviewed: I have personally reviewed following labs and imaging studies  CBC: Recent Labs  Lab 10/27/23 0454 11/02/23 1352  WBC 7.8 6.8  NEUTROABS 3.6  --   HGB 14.3 14.0  HCT 42.0 42.5  MCV 96.6 98.6  PLT 266 233    Basic Metabolic Panel: Recent Labs  Lab 10/27/23 0454  NA 139  K 4.0  CL 104  CO2 24  GLUCOSE 85  BUN 16  CREATININE 0.59  CALCIUM  8.9  MG 2.0    CBG: No results for input(s): GLUCAP in the last 168 hours.  No results found for this or any previous visit (from the past 240 hours).   Radiology Studies: No results found.   Scheduled Meds:  buPROPion   75 mg Oral BID   enoxaparin  (LOVENOX ) injection  40 mg Subcutaneous Q24H   FLUoxetine   40 mg Oral Daily   fluticasone  furoate-vilanterol  1 puff Inhalation Daily   gabapentin   300 mg Oral TID   lamoTRIgine   25 mg Oral BID   latanoprost   1 drop Both Eyes QHS   levothyroxine   25 mcg Oral QAC breakfast   losartan   25 mg Oral Daily   melatonin  3 mg Oral QHS   metoprolol  tartrate  12.5 mg Oral BID   montelukast   5 mg Oral QHS   pantoprazole   40 mg Oral Daily   Continuous Infusions:  lactated ringers  40 mL/hr at 11/02/23 1157     LOS: 7 days   Time spent: 35 mins  Heidi Verno Vicci, MD How to contact the [facility] Attending or Consulting provider 7A - 7P or covering provider during after hours 7P  -7A, for this patient?  Check the care team in [facility] and look for a) attending/consulting TRH provider listed and b) the TRH team listed Log into [email or web address] to find provider on call.  Locate the TRH provider you are looking for under [facility] and page to a number that you can be directly reached. If you still have difficulty reaching the provider, please page the [facility] (Director on Call) for the Hospitalists listed on amion for assistance.  11/02/2023, 2:32 PM

## 2023-11-03 DIAGNOSIS — G9340 Encephalopathy, unspecified: Secondary | ICD-10-CM | POA: Diagnosis not present

## 2023-11-03 DIAGNOSIS — F0390 Unspecified dementia without behavioral disturbance: Secondary | ICD-10-CM

## 2023-11-03 DIAGNOSIS — E039 Hypothyroidism, unspecified: Secondary | ICD-10-CM | POA: Diagnosis not present

## 2023-11-03 LAB — RPR: RPR Ser Ql: NONREACTIVE

## 2023-11-03 MED ORDER — CYANOCOBALAMIN 1000 MCG/ML IJ SOLN
1000.0000 ug | Freq: Once | INTRAMUSCULAR | Status: AC
  Administered 2023-11-03: 1000 ug via INTRAMUSCULAR
  Filled 2023-11-03: qty 1

## 2023-11-03 NOTE — Progress Notes (Signed)
 Physical Therapy Treatment Patient Details Name: Heidi Fuentes MRN: [id number] DOB: 18-Nov-1945 Today's Date: 11/03/2023   History of Present Illness Heidi Fuentes is a 78 y.o. female with medical history significant for asthma, bipolar disorder, hypertension, chronic pain, and bronchiectasis.  Patient was brought to the ED via EMS for reports of altered mental status.  Patient is alone but has a caregiver, she called patient's son the patient was not her normal self, and he said to bring patient to the ED.  The time of my evaluation, patient is awake and alert and oriented to person, but not time, she is unable to tell me why she is here. She appears to answer other questions appropriately.  She denies difficulty breathing, no cough, no vomiting or diarrhea, no weakness of her extremities, endorses good oral intake.     She was hospitalized back in February- 2/1 to 2/6 also for altered mental status thought secondary to COVID infection and  hypoxia.    PT Comments  Patient presents lethargic, but once sitting up able to participate with therapy.  Patent limited to taking a few side steps at bedside due to BLE weakness and tolerated sitting up in chair after therapy with NT present in room. Patient will benefit from continued skilled physical therapy in hospital and recommended venue below to increase strength, balance, endurance for safe ADLs and gait.     If plan is discharge home, recommend the following: A lot of help with walking and/or transfers;A lot of help with bathing/dressing/bathroom;Help with stairs or ramp for entrance;Assistance with cooking/housework;Assist for transportation   Can travel by private vehicle     No  Equipment Recommendations  None recommended by PT    Recommendations for Other Services       Precautions / Restrictions Precautions Precautions: Fall Recall of Precautions/Restrictions: Impaired Restrictions Weight Bearing Restrictions Per Provider Order: No      Mobility  Bed Mobility Overal bed mobility: Needs Assistance Bed Mobility: Supine to Sit     Supine to sit: Mod assist     General bed mobility comments: increased time, labored movement    Transfers Overall transfer level: Needs assistance Equipment used: Rolling walker (2 wheels) Transfers: Sit to/from Stand, Bed to chair/wheelchair/BSC Sit to Stand: Mod assist   Step pivot transfers: Mod assist       General transfer comment: increased time, labored movement    Ambulation/Gait Ambulation/Gait assistance: Mod assist Gait Distance (Feet): 4 Feet Assistive device: Rolling walker (2 wheels) Gait Pattern/deviations: Decreased step length - right, Decreased step length - left, Decreased stride length Gait velocity: slow     General Gait Details: Limited to a few slow labored side steps due to weakness and fatigue   Stairs             Wheelchair Mobility     Tilt Bed    Modified Rankin (Stroke Patients Only)       Balance Overall balance assessment: Needs assistance Sitting-balance support: Feet supported, No upper extremity supported Sitting balance-Leahy Scale: Fair Sitting balance - Comments: fair/good seated at EOB   Standing balance support: Reliant on assistive device for balance, During functional activity, Bilateral upper extremity supported Standing balance-Leahy Scale: Poor Standing balance comment: fair/poor using RW                            Communication Communication Communication: No apparent difficulties Factors Affecting Communication: Difficulty expressing self  Cognition Arousal:  Alert Behavior During Therapy: Impulsive, Anxious   PT - Cognitive impairments: Problem solving, Safety/Judgement, Initiation                       PT - Cognition Comments: requires repeated verbal/tactile cueing for following directions Following commands: Impaired Following commands impaired: Follows one step commands  inconsistently    Cueing Cueing Techniques: Verbal cues, Gestural cues, Tactile cues  Exercises      General Comments        Pertinent Vitals/Pain Pain Assessment Pain Assessment: No/denies pain    Home Living                          Prior Function            PT Goals (current goals can now be found in the care plan section) Acute Rehab PT Goals Patient Stated Goal: Return home PT Goal Formulation: With patient Time For Goal Achievement: 11/09/23 Progress towards PT goals: Progressing toward goals    Frequency    Min 3X/week      PT Plan      Co-evaluation              AM-PAC PT 6 Clicks Mobility   Outcome Measure  Help needed turning from your back to your side while in a flat bed without using bedrails?: A Little Help needed moving from lying on your back to sitting on the side of a flat bed without using bedrails?: A Lot Help needed moving to and from a bed to a chair (including a wheelchair)?: A Lot Help needed standing up from a chair using your arms (e.g., wheelchair or bedside chair)?: A Little Help needed to walk in hospital room?: A Lot Help needed climbing 3-5 steps with a railing? : A Lot 6 Click Score: 14    End of Session   Activity Tolerance: Patient tolerated treatment well;Patient limited by fatigue Patient left: in chair;with call bell/phone within reach;with chair alarm set Nurse Communication: Mobility status PT Visit Diagnosis: Unsteadiness on feet (R26.81);Muscle weakness (generalized) (M62.81);Difficulty in walking, not elsewhere classified (R26.2);Other abnormalities of gait and mobility (R26.89)     Time: 8891-8868 PT Time Calculation (min) (ACUTE ONLY): 23 min  Charges:    $Therapeutic Activity: 23-37 mins PT General Charges $$ ACUTE PT VISIT: 1 Visit                     3:40 PM, 11/03/23 Lynwood Music, MPT Physical Therapist with [facility] [phone] office [phone] mobile  phone

## 2023-11-03 NOTE — Plan of Care (Signed)
   Problem: Education: Goal: Knowledge of General Education information will improve Description Including pain rating scale, medication(s)/side effects and non-pharmacologic comfort measures Outcome: Progressing   Problem: Health Behavior/Discharge Planning: Goal: Ability to manage health-related needs will improve Outcome: Progressing

## 2023-11-03 NOTE — Progress Notes (Signed)
Patient refused oral medications.

## 2023-11-03 NOTE — TOC Progression Note (Signed)
 Transition of Care [facility]) - Progression Note    Patient Details  Name: Heidi Fuentes MRN: [id number] Date of Birth: [birth year]  [facility]) CM/SW Contact  Lucie Lunger, [place] Phone Number: 11/03/2023, 12:51 PM  Clinical Narrative:    CSW attempted to reach Marcelline with [place] DSS, unable to reach at this time, another VM left requesting call back. CSW also reached out to Kaylee with [place] DSS to confirm date petition was made to clerk of court for guardianship and if there are any updates, secure VM left requesting call back. CSW was able to reach [place] who states she will see if she can locate Kaylee and ask her to call this CSW for updates. TOC to follow.   Expected Discharge Plan: Skilled Nursing Facility Barriers to Discharge: Unsafe home situation, DSS Guardianship  DSS Guardianship request start date: 10/27/23 DSS Service county:  [place], transferred to [place] due to conflict of interest) DSS Date of decision notification: 10/28/23 DSS Guardianship decision: Substantiated      Expected Discharge Plan and Services In-house Referral: Clinical Social Work Discharge Planning Services: NA Post Acute Care Choice: Nursing Home, Skilled Nursing Facility Living arrangements for the past 2 months: Single Family Home                                       Social Drivers of Health (SDOH) Interventions SDOH Screenings   Food Insecurity: No Food Insecurity (10/25/2023)  Housing: Low Risk  (10/25/2023)  Transportation Needs: No Transportation Needs (10/25/2023)  Utilities: Not At Risk (10/25/2023)  Social Connections: Unknown (10/25/2023)  Tobacco Use: Low Risk  (10/25/2023)    Readmission Risk Interventions    10/29/2023   10:31 AM 10/28/2023   10:36 AM 03/07/2023    2:31 PM  Readmission Risk Prevention Plan  Transportation Screening Complete Complete Complete  PCP or Specialist Appt within 5-7 Days  Complete   Home Care  Screening Complete Complete   Medication Review (RN CM) Complete Complete   Medication Review (RN Care Manager)   Complete  HRI or Home Care Consult   Complete  SW Recovery Care/Counseling Consult   Complete  Palliative Care Screening   Not Applicable  Skilled Nursing Facility   --

## 2023-11-03 NOTE — Progress Notes (Signed)
 PROGRESS NOTE  Heidi Fuentes FMW:[id number] DOB: [birth year] DOA: 10/25/2023 PCP: [name], DO  Brief History:  78 y.o. female with medical history significant for asthma, bipolar disorder, hypertension, chronic pain, and bronchiectasis presented with altered mental status.  At the time of evaluation by hospitalist on admission, patient was awake and alert and oriented to person but not time.  She was slightly tachycardic, tachypneic.  UA not suggestive of UTI.  Ammonia 18.  Head CT negative for acute abnormality.  Portable chest x-ray unremarkable.  MRI of brain was negative for any acute abnormality.    Assessment/Plan: Acute metabolic or toxic encephalopathy and Delirium  -Unclear cause of altered mental status. -mental status overall improved -pt has underlying cognitive impairement/Major neurocognitive disorder -CT and MRI of brain negative as above.  No focal neurologic deficit.   -UA neg for UTI.   -Patient is on multiple psychotropic medications which can attribute to altered mental state but pt not likely taking them -Patient apparently stopped taking Lamictal  by herself.  Other psychoactive medications are currently on hold -Ammonia normal.  B12 (286), folic acid  (17.6) and TSH (1.425). -Fall precautions.  PT eval - with recommendation for SNF placement, TOC consulted and requested to assist with placement. -continues to have disorganized speech  -tried to restart lamictal  she was taking at home but had stopped it per reporting, will start with lower dose and see if she tolerates it - - pt refusing to take meds at times and spitting them out  - due to uncontrolled bipolar disorder and refusing to take oral meds, I requested a TTS consultation for medical management and to determine if she needs inpatient psychiatric hospitalization - pt has been medically cleared and can have TTS consultation today if they can see her - [facility] consult was completed on 9/29 and they  indicate that inpatient psych hospitalization not required - they ordered haloperidol  and today she is very sedated; added IV fluids due to not eating/drinking   Bipolar disorder -- uncontrolled and she is refusing to take meds, spitting them out -- TTS consult requested for medical mgmt and to determine if she needs inpatient psychiatric hospitalization -- TTS deferred to [facility]IS team who saw patient 9/29, see consult note and recommendations--does not feel pt needs inpatient [facility]   Hypothyroidism -Continue levothyroxine  -TSH 1.425   Hypertension -Blood pressure stable.   -Continue metoprolol , losartan  -- pt recently had been refusing to take and spitting out meds for last several days   GERD Continue Protonix  if pt takes it   COPD - Continue current bronchodilators   Chronic pain - Apparently she is not taking oxycodone  anymore.  -Outpatient follow-up with PCP and/or pain management   Anxiety/depression -carefully restarting home meds--however she has refused them, spit them out   Physical deconditioning - PT eval--requested and recommendation is for SNF - TOC working on SNF placement    Hypokalemia - Resolved    Subjective: Pt awake and alert.  Denies cp, sob.  Otherwise ROS not obtainable  Objective: Vitals:   11/02/23 2057 11/03/23 0331 11/03/23 0827 11/03/23 1342  BP: (!) 148/83 (!) 150/72  (!) 148/63  Pulse: 87 84  85  Resp: 17 16    Temp: 98 F (36.7 C) 98 F (36.7 C)  98.3 F (36.8 C)  TempSrc: Oral Oral  Oral  SpO2: 99% 99% 97% 97%  Weight:  49.2 kg    Height:  No intake or output data in the 24 hours ending 11/03/23 1834 Weight change: 1.1 kg Exam:  General:  Pt is alert, intermittently follows commands appropriately, not in acute distress HEENT: No icterus, No thrush, No neck mass, Gibbs/AT Cardiovascular: RRR, S1/S2, no rubs, no gallops Respiratory: bibasilar rales. No wheeze Abdomen: Soft/+BS, non tender, non distended, no  guarding Extremities: No edema, No lymphangitis, No petechiae, No rashes, no synovitis   Data Reviewed: I have personally reviewed following labs and imaging studies Basic Metabolic Panel: No results for input(s): NA, K, CL, CO2, GLUCOSE, BUN, CREATININE, CALCIUM , MG, PHOS in the last 168 hours. Liver Function Tests: No results for input(s): AST, ALT, ALKPHOS, BILITOT, PROT, ALBUMIN in the last 168 hours. No results for input(s): LIPASE, AMYLASE in the last 168 hours. No results for input(s): AMMONIA in the last 168 hours. Coagulation Profile: No results for input(s): INR, PROTIME in the last 168 hours. CBC: Recent Labs  Lab 11/02/23 1352  WBC 6.8  HGB 14.0  HCT 42.5  MCV 98.6  PLT 233   Cardiac Enzymes: No results for input(s): CKTOTAL, CKMB, CKMBINDEX, TROPONINI in the last 168 hours. BNP: Invalid input(s): POCBNP CBG: No results for input(s): GLUCAP in the last 168 hours. HbA1C: No results for input(s): HGBA1C in the last 72 hours. Urine analysis:    Component Value Date/Time   COLORURINE YELLOW 10/25/2023 1521   APPEARANCEUR CLEAR 10/25/2023 1521   APPEARANCEUR Cloudy 06/02/2012 1042   LABSPEC 1.027 10/25/2023 1521   LABSPEC 1.017 06/02/2012 1042   PHURINE 5.0 10/25/2023 1521   GLUCOSEU NEGATIVE 10/25/2023 1521   GLUCOSEU Negative 06/02/2012 1042   HGBUR NEGATIVE 10/25/2023 1521   BILIRUBINUR NEGATIVE 10/25/2023 1521   BILIRUBINUR Negative 06/02/2012 1042   KETONESUR 20 (A) 10/25/2023 1521   PROTEINUR 30 (A) 10/25/2023 1521   UROBILINOGEN 0.2 12/11/2014 1429   NITRITE NEGATIVE 10/25/2023 1521   LEUKOCYTESUR NEGATIVE 10/25/2023 1521   LEUKOCYTESUR 3+ 06/02/2012 1042   Sepsis Labs: @LABRCNTIP (procalcitonin:4,lacticidven:4) )No results found for this or any previous visit (from the past 240 hours).   Scheduled Meds:  buPROPion   75 mg Oral BID   enoxaparin  (LOVENOX ) injection  40 mg Subcutaneous Q24H    FLUoxetine   40 mg Oral Daily   fluticasone  furoate-vilanterol  1 puff Inhalation Daily   gabapentin   300 mg Oral TID   lamoTRIgine   25 mg Oral BID   latanoprost   1 drop Both Eyes QHS   levothyroxine   25 mcg Oral QAC breakfast   losartan   25 mg Oral Daily   melatonin  3 mg Oral QHS   metoprolol  tartrate  12.5 mg Oral BID   montelukast   5 mg Oral QHS   pantoprazole   40 mg Oral Daily   Continuous Infusions:  Procedures/Studies: MR BRAIN WO CONTRAST Result Date: 10/25/2023 CLINICAL DATA:  Provided history: Mental status change, unknown cause. EXAM: MRI HEAD WITHOUT CONTRAST TECHNIQUE: Multiplanar, multiecho pulse sequences of the brain and surrounding structures were obtained without intravenous contrast. COMPARISON:  Head CT 10/25/2023.  Brain MRI 07/07/2006. FINDINGS: Intermittently motion degraded examination. Most notably, the sagittal T1 sequence is severely motion degraded, the axial T2 sequence is moderate-to-severely motion degraded, the axial FLAIR sequence is severely motion degraded and the axial T1 sequence is moderately motion degraded. Within this limitation, findings are as follows. Brain: Generalized cerebral atrophy. Multifocal T2 FLAIR hyperintense signal abnormality within the cerebral white matter, nonspecific but compatible with moderate chronic small vessel ischemic disease. No evidence of acute infarct. No  intracranial mass or extra-axial fluid collection is identified. No chronic intracranial blood products. No midline shift. Vascular: Maintained flow voids within the proximal large arterial vessels. Skull and upper cervical spine: No focal worrisome marrow lesion. Multilevel facet arthropathy within the cervical spine at the visible levels. Sinuses/Orbits: No mass or acute finding within the imaged orbits. Redemonstrated chronic, depressed fracture deformity of the left orbital floor. No significant paranasal sinus disease. IMPRESSION: 1. Significantly motion degraded  examination. Within this limitation, findings are as follows. 2. No acute intracranial abnormality is identified. The diffusion-weighted imaging is of good quality and there is no evidence of an acute infarct. 3. Moderate chronic small vessel ischemic changes within the cerebral white matter. 4. Generalized cerebral atrophy. Electronically Signed   By: Rockey Childs D.O.   On: 10/25/2023 20:13   CT HEAD WO CONTRAST Result Date: 10/25/2023 CLINICAL DATA:  Altered mental status.  Chronic UTIs. EXAM: CT HEAD WITHOUT CONTRAST TECHNIQUE: Contiguous axial images were obtained from the base of the skull through the vertex without intravenous contrast. RADIATION DOSE REDUCTION: This exam was performed according to the departmental dose-optimization program which includes automated exposure control, adjustment of the mA and/or kV according to patient size and/or use of iterative reconstruction technique. COMPARISON:  03/07/2023 FINDINGS: Brain: Ventricles, cisterns and other CSF spaces are normal. Mild chronic ischemic microvascular disease is present. No mass, mass effect, shift of midline structures or acute hemorrhage. No acute infarction. Vascular: No hyperdense vessel or unexpected calcification. Skull: Normal. Negative for fracture or focal lesion. Sinuses/Orbits: No acute finding. Other: None. IMPRESSION: 1. No acute findings. 2. Mild chronic ischemic microvascular disease. Electronically Signed   By: Toribio Agreste M.D.   On: 10/25/2023 16:29   DG Chest Port 1 View Result Date: 10/25/2023 CLINICAL DATA:  Altered mental status, back pain. EXAM: PORTABLE CHEST 1 VIEW COMPARISON:  03/06/2023. FINDINGS: Trachea is midline. Heart size within normal limits. Thoracic aorta is calcified. Lungs are clear. No pleural fluid. IMPRESSION: No acute findings. Electronically Signed   By: Newell Eke M.D.   On: 10/25/2023 16:10   DG BONE DENSITY (DXA) Result Date: 10/11/2023 EXAM: DUAL X-RAY ABSORPTIOMETRY (DXA) FOR BONE  MINERAL DENSITY 10/11/2023 8:58 am CLINICAL DATA:  78 year old Female Postmenopausal. TECHNIQUE: An axial (e.g., hips, spine) and/or appendicular (e.g., radius) exam was performed, as appropriate, using [employer] Psychologist, sport and exercise at [facility]. Images are obtained for bone mineral density measurement and are not obtained for diagnostic purposes. [id number] Exclusions: Lumbar spine due to advanced degenerative changes. COMPARISON:  11/17/2019. FINDINGS: Scan quality: Good. LEFT FEMORAL NECK: BMD (in g/cm2): 0.788 T-score: -1.8 Z-score: 0.3 LEFT TOTAL HIP: BMD (in g/cm2): 0.796 T-score: -1.7 Z-score: 0.2 RIGHT FEMORAL NECK: BMD (in g/cm2): 0.707 T-score: -2.4 Z-score: -0.3 RIGHT TOTAL HIP: BMD (in g/cm2): 0.756 T-score: -2.0 Z-score: -0.1 DUAL-FEMUR TOTAL MEAN: Rate of change from previous exam: -6.5 % LEFT FOREARM (RADIUS 33%): BMD (in g/cm2): 0.572 T-score: -1.9 Z-score: 0.7 Rate of change from previous exam: No significant rate of change from previous exam. FRAX 10-YEAR PROBABILITY OF FRACTURE: 10-year fracture risk is performed using the [facility] calculator based on patient-reported risk factors. Major osteoporotic fracture: 24.9% Hip fracture: 7.8% Other situations known to alter the reliability of the FRAX score should be considered when making treatment decisions, including chronic glucocorticoid use and past treatments. Further guidance on treatment can be found at the [facility]oporosis Foundation's website [email or web address] IMPRESSION: Osteopenia based on BMD. Fracture risk is increased.  Increased risk is based on low BMD, FRAX calculation. RECOMMENDATIONS: 1. All patients should optimize calcium  and vitamin D  intake. 2. Consider FDA-approved medical therapies in postmenopausal women and men aged 84 years and older, based on the following: - A hip or vertebral (clinical or morphometric) fracture - T-score less than or equal to -2.5 and secondary causes have been excluded. - Low  bone mass (T-score between -1.0 and -2.5) and a 10-year probability of a hip fracture greater than or equal to 3% or a 10-year probability of a major osteoporosis-related fracture greater than or equal to 20% based on the [place] -adapted WHO algorithm. - Clinician judgment and/or patient preferences may indicate treatment for people with 10-year fracture probabilities above or below these levels 3. Patients with diagnosis of osteoporosis or at high risk for fracture should have regular bone mineral density tests. For patients eligible for Medicare, routine testing is allowed once every 2 years. The testing frequency can be increased to one year for patients who have rapidly progressing disease, those who are receiving or discontinuing medical therapy to restore bone mass, or have additional risk factors. Electronically Signed   By: Reyes Phi M.D.   On: 10/11/2023 12:16    Alm Schneider, DO  [facility]  If 7PM-7AM, please contact night-coverage [email or web address] Password [facility] 11/03/2023, 6:34 PM   LOS: 8 days

## 2023-11-04 DIAGNOSIS — F3131 Bipolar disorder, current episode depressed, mild: Secondary | ICD-10-CM | POA: Diagnosis not present

## 2023-11-04 DIAGNOSIS — F0390 Unspecified dementia without behavioral disturbance: Secondary | ICD-10-CM | POA: Diagnosis not present

## 2023-11-04 DIAGNOSIS — G9340 Encephalopathy, unspecified: Secondary | ICD-10-CM | POA: Diagnosis not present

## 2023-11-04 LAB — BASIC METABOLIC PANEL WITH GFR
Anion gap: 15 (ref 5–15)
BUN: 28 mg/dL — ABNORMAL HIGH (ref 8–23)
CO2: 23 mmol/L (ref 22–32)
Calcium: 9.5 mg/dL (ref 8.9–10.3)
Chloride: 104 mmol/L (ref 98–111)
Creatinine, Ser: 0.65 mg/dL (ref 0.44–1.00)
GFR, Estimated: >60 mL/min (ref >60)
Glucose, Bld: 74 mg/dL (ref 70–99)
Potassium: 3.7 mmol/L (ref 3.5–5.1)
Sodium: 142 mmol/L (ref 135–145)

## 2023-11-04 LAB — MAGNESIUM: Magnesium: 2.3 mg/dL (ref 1.7–2.4)

## 2023-11-04 LAB — PHOSPHORUS: Phosphorus: 3.8 mg/dL (ref 2.5–4.6)

## 2023-11-04 MED ORDER — PROSOURCE PLUS PO LIQD
30.0000 mL | Freq: Three times a day (TID) | ORAL | Status: DC
  Administered 2023-11-04 – 2023-11-12 (×9): 30 mL via ORAL
  Filled 2023-11-04 (×13): qty 30

## 2023-11-04 MED ORDER — ADULT MULTIVITAMIN W/MINERALS CH
1.0000 | ORAL_TABLET | Freq: Every day | ORAL | Status: DC
  Administered 2023-11-05 – 2023-11-26 (×20): 1 via ORAL
  Filled 2023-11-04 (×22): qty 1

## 2023-11-04 MED ORDER — ENSURE PLUS HIGH PROTEIN PO LIQD
237.0000 mL | Freq: Two times a day (BID) | ORAL | Status: DC
  Administered 2023-11-05 – 2023-11-12 (×7): 237 mL via ORAL

## 2023-11-04 NOTE — Plan of Care (Signed)

## 2023-11-04 NOTE — Progress Notes (Signed)
 Initial Nutrition Assessment  DOCUMENTATION CODES:   Not applicable  INTERVENTION:   -Continue dysphagia 2 diet -Ensure Plus High Protein po BID, each supplement provides 350 kcal and 20 grams of protein  -30 ml Prosource Plus TID, each supplement provides 100 kcals and 15 grams protein -Magic cup TID with meals, each supplement provides 290 kcal and 9 grams of protein  -Feeding assistance with meals -MVI with minerals daily  NUTRITION DIAGNOSIS:   Inadequate oral intake related to poor appetite as evidenced by per patient/family report.  GOAL:   Patient will meet greater than or equal to 90% of their needs  MONITOR:   PO intake, Supplement acceptance  REASON FOR ASSESSMENT:   Malnutrition Screening Tool    ASSESSMENT:   Pt with medical history significant for asthma, bipolar disorder, hypertension, chronic pain, and bronchiectasis presented with altered mental status.  Pt admitted with acute metabolic or toxic encephalopathy and delirium.   9/24- s/p BSE- dysphagia 2 diet with thin liquids  Reviewed I/O's: -500 ml x 24 hours and +817 ml since admission  UOP: 500 ml x 24 hours  Pt unavailable at time of visit. Attempted to speak with pt via call to hospital room phone, however, unable to reach. RD unable to obtain further nutrition-related history or complete nutrition-focused physical exam at this time.    Per MD notes, head CT and MRI of brain. negative for acute abnormality. Chest x-ray unremarkable.  Per chart review, pt with disorganized speech and refusing oral meds. TTS was consulted, who deferred to IRIS team. Pt does not meet criteria for [facility] admission.   Per TOC notes, plan for guardianship to be obtained.   Noted pt on a dysphagia 2 diet with thin liquids. Meal completions 65-100%. Per discussion with RN, pt took a sip of water  with medications, but did not each much of breakfast. RN unsure of last BM; last documented on 10/28/23. RD reached out to MD  regarding bowel regimen, however, MD reports pt is not taking her medications.    Reviewed wt hx; pt has experienced a 6.5% wt loss over the past 8 months. Pt also experienced a 3.6% wt loss over the past week, which is significant for time frame. Pt is at high risk for malnutrition, however, unable to identify at this time.   Medications reviewed and include lovenox , neurontin , and protonix .   Labs reviewed.    Diet Order:   Diet Order             DIET DYS 2 Room service appropriate? Yes; Fluid consistency: Thin  Diet effective now                   EDUCATION NEEDS:   No education needs have been identified at this time  Skin:  Skin Assessment: Reviewed RN Assessment  Last BM:  10/28/23  Height:   Ht Readings from Last 1 Encounters:  10/25/23 4' 11 (1.499 m)    Weight:   Wt Readings from Last 1 Encounters:  11/04/23 48.6 kg    Ideal Body Weight:  44.5 kg  BMI:  Body mass index is 21.64 kg/m.  Estimated Nutritional Needs:   Kcal:  1450-1650  Protein:  70-85 grams  Fluid:  1.4-1.6 L    Margery ORN, RD, LDN, CDCES Registered Dietitian III Certified Diabetes Care and Education Specialist If unable to reach this RD, please use RD Inpatient group chat on secure chat between hours of 8am-4 pm daily

## 2023-11-04 NOTE — Progress Notes (Signed)
 PROGRESS NOTE  Heidi Fuentes FMW:[id number] DOB: Aug 23, 1945 DOA: 10/25/2023 PCP: [name], DO  Brief History:  78 y.o. female with medical history significant for asthma, bipolar disorder, hypertension, chronic pain/fibromyalgia, and bronchiectasis presented with altered mental status.  At the time of evaluation by hospitalist on admission, patient was awake and alert and oriented to person but not time.  She was slightly tachycardic, tachypneic.  UA not suggestive of UTI.  Ammonia 18.  Head CT negative for acute abnormality.  Portable chest x-ray unremarkable.  MRI of brain was negative for any acute abnormality.    Assessment/Plan: Acute metabolic or toxic encephalopathy and Delirium  -likely due to polypharmacy in setting of cognitive impairment -mental status overall improved but remains pleasantly confused and refusing to take medications -pt has underlying cognitive impairement/Major neurocognitive disorder -CT and MRI of brain negative as above.  No focal neurologic deficit.   -UA neg for UTI.   -Patient is on multiple psychotropic medications which can attribute to altered mental state but pt not likely taking them>>d/c buproprion for now -Patient apparently stopped taking Lamictal  by herself.   -Ammonia normal.  B12 (286), folic acid  (17.6) and TSH (1.425). -IM B12 given x 2 -Fall precautions.  PT eval - with recommendation for SNF placement, TOC consulted and requested to assist with placement. -tried to restart lamictal  she was taking at home but had stopped it per reporting, will start with lower dose and see if she tolerates it - - pt refusing to take meds at times and spitting them out  - due to uncontrolled bipolar disorder and refusing to take oral meds, I requested a TTS consultation for medical management and to determine if she needs inpatient psychiatric hospitalization - pt has been medically cleared and can have TTS consultation today if they can see  her - [facility] consult was completed on 9/29 and they indicate that inpatient psych hospitalization not required - they ordered haloperidol  and today she is very sedated; added IV fluids due to not eating/drinking   Bipolar disorder -- uncontrolled and she is refusing to take meds, spitting them out -- TTS consult requested for medical mgmt and to determine if she needs inpatient psychiatric hospitalization -- TTS deferred to [facility] team who saw patient 9/29, see consult note and recommendations--does not feel pt needs inpatient [facility]   Hypothyroidism -Continue levothyroxine  -TSH 1.425   Hypertension -Blood pressure stable.   -Continue metoprolol -- pt recently had been refusing to take and spitting out meds for last several days -d/c losartan    GERD Continue Protonix  if pt takes it   COPD - Continue current bronchodilators   Chronic pain - Apparently she is not taking oxycodone  anymore.  -Outpatient follow-up with PCP and/or pain management   Anxiety/depression -carefully restarting home meds--however she has refused them, spit them out   Physical deconditioning - PT eval--requested and recommendation is for SNF - TOC working on SNF placement    Hypokalemia - Resolved    Family Communication:  no Family at bedside  Consultants:  TTS/IRIS  Code Status:  FULL  DVT Prophylaxis:  [place] Lovenox    Procedures: As Listed in Progress Note Above  Antibiotics: None      Subjective: Pt denies cp, sob, abd pain.  Remainder ROS unobtainable due to patient cognitive impairment  Objective: Vitals:   11/04/23 0853 11/04/23 0901 11/04/23 1038 11/04/23 1410  BP: (!) 153/76  (!) 153/76 (!) 123/56  Pulse:  87  87 67  Resp: 19   18  Temp: 97.6 F (36.4 C)   (!) 97.5 F (36.4 C)  TempSrc: Oral   Oral  SpO2: 97% 97%  97%  Weight:      Height:        Intake/Output Summary (Last 24 hours) at 11/04/2023 1748 Last data filed at 11/04/2023 1411 Gross per 24 hour  Intake 480 ml   Output 1300 ml  Net -820 ml   Weight change: -0.6 kg Exam:  General:  Pt is alert, follows commands appropriately, not in acute distress HEENT: No icterus, No thrush, No neck mass, /AT Cardiovascular: RRR, S1/S2, no rubs, no gallops Respiratory: CTA bilaterally, no wheezing, no crackles, no rhonchi Abdomen: Soft/+BS, non tender, non distended, no guarding Extremities: No edema, No lymphangitis, No petechiae, No rashes, no synovitis   Data Reviewed: I have personally reviewed following labs and imaging studies Basic Metabolic Panel: Recent Labs  Lab 11/04/23 0456  NA 142  K 3.7  CL 104  CO2 23  GLUCOSE 74  BUN 28*  CREATININE 0.65  CALCIUM  9.5  MG 2.3  PHOS 3.8   Liver Function Tests: No results for input(s): AST, ALT, ALKPHOS, BILITOT, PROT, ALBUMIN in the last 168 hours. No results for input(s): LIPASE, AMYLASE in the last 168 hours. No results for input(s): AMMONIA in the last 168 hours. Coagulation Profile: No results for input(s): INR, PROTIME in the last 168 hours. CBC: Recent Labs  Lab 11/02/23 1352  WBC 6.8  HGB 14.0  HCT 42.5  MCV 98.6  PLT 233   Cardiac Enzymes: No results for input(s): CKTOTAL, CKMB, CKMBINDEX, TROPONINI in the last 168 hours. BNP: Invalid input(s): POCBNP CBG: No results for input(s): GLUCAP in the last 168 hours. HbA1C: No results for input(s): HGBA1C in the last 72 hours. Urine analysis:    Component Value Date/Time   COLORURINE YELLOW 10/25/2023 1521   APPEARANCEUR CLEAR 10/25/2023 1521   APPEARANCEUR Cloudy 06/02/2012 1042   LABSPEC 1.027 10/25/2023 1521   LABSPEC 1.017 06/02/2012 1042   PHURINE 5.0 10/25/2023 1521   GLUCOSEU NEGATIVE 10/25/2023 1521   GLUCOSEU Negative 06/02/2012 1042   HGBUR NEGATIVE 10/25/2023 1521   BILIRUBINUR NEGATIVE 10/25/2023 1521   BILIRUBINUR Negative 06/02/2012 1042   KETONESUR 20 (A) 10/25/2023 1521   PROTEINUR 30 (A) 10/25/2023 1521    UROBILINOGEN 0.2 12/11/2014 1429   NITRITE NEGATIVE 10/25/2023 1521   LEUKOCYTESUR NEGATIVE 10/25/2023 1521   LEUKOCYTESUR 3+ 06/02/2012 1042   Sepsis Labs: @LABRCNTIP (procalcitonin:4,lacticidven:4) )No results found for this or any previous visit (from the past 240 hours).   Scheduled Meds:  (feeding supplement) PROSource Plus  30 mL Oral TID BM   enoxaparin  (LOVENOX ) injection  40 mg Subcutaneous Q24H   [START ON 11/05/2023] feeding supplement  237 mL Oral BID BM   FLUoxetine   40 mg Oral Daily   fluticasone  furoate-vilanterol  1 puff Inhalation Daily   gabapentin   300 mg Oral TID   lamoTRIgine   25 mg Oral BID   latanoprost   1 drop Both Eyes QHS   levothyroxine   25 mcg Oral QAC breakfast   melatonin  3 mg Oral QHS   metoprolol  tartrate  12.5 mg Oral BID   multivitamin with minerals  1 tablet Oral Daily   pantoprazole   40 mg Oral Daily   Continuous Infusions:  Procedures/Studies: MR BRAIN WO CONTRAST Result Date: 10/25/2023 CLINICAL DATA:  Provided history: Mental status change, unknown cause. EXAM: MRI HEAD WITHOUT CONTRAST  TECHNIQUE: Multiplanar, multiecho pulse sequences of the brain and surrounding structures were obtained without intravenous contrast. COMPARISON:  Head CT 10/25/2023.  Brain MRI 07/07/2006. FINDINGS: Intermittently motion degraded examination. Most notably, the sagittal T1 sequence is severely motion degraded, the axial T2 sequence is moderate-to-severely motion degraded, the axial FLAIR sequence is severely motion degraded and the axial T1 sequence is moderately motion degraded. Within this limitation, findings are as follows. Brain: Generalized cerebral atrophy. Multifocal T2 FLAIR hyperintense signal abnormality within the cerebral white matter, nonspecific but compatible with moderate chronic small vessel ischemic disease. No evidence of acute infarct. No intracranial mass or extra-axial fluid collection is identified. No chronic intracranial blood products. No  midline shift. Vascular: Maintained flow voids within the proximal large arterial vessels. Skull and upper cervical spine: No focal worrisome marrow lesion. Multilevel facet arthropathy within the cervical spine at the visible levels. Sinuses/Orbits: No mass or acute finding within the imaged orbits. Redemonstrated chronic, depressed fracture deformity of the left orbital floor. No significant paranasal sinus disease. IMPRESSION: 1. Significantly motion degraded examination. Within this limitation, findings are as follows. 2. No acute intracranial abnormality is identified. The diffusion-weighted imaging is of good quality and there is no evidence of an acute infarct. 3. Moderate chronic small vessel ischemic changes within the cerebral white matter. 4. Generalized cerebral atrophy. Electronically Signed   By: Rockey Childs D.O.   On: 10/25/2023 20:13   CT HEAD WO CONTRAST Result Date: 10/25/2023 CLINICAL DATA:  Altered mental status.  Chronic UTIs. EXAM: CT HEAD WITHOUT CONTRAST TECHNIQUE: Contiguous axial images were obtained from the base of the skull through the vertex without intravenous contrast. RADIATION DOSE REDUCTION: This exam was performed according to the departmental dose-optimization program which includes automated exposure control, adjustment of the mA and/or kV according to patient size and/or use of iterative reconstruction technique. COMPARISON:  03/07/2023 FINDINGS: Brain: Ventricles, cisterns and other CSF spaces are normal. Mild chronic ischemic microvascular disease is present. No mass, mass effect, shift of midline structures or acute hemorrhage. No acute infarction. Vascular: No hyperdense vessel or unexpected calcification. Skull: Normal. Negative for fracture or focal lesion. Sinuses/Orbits: No acute finding. Other: None. IMPRESSION: 1. No acute findings. 2. Mild chronic ischemic microvascular disease. Electronically Signed   By: Toribio Agreste M.D.   On: 10/25/2023 16:29   DG Chest  Port 1 View Result Date: 10/25/2023 CLINICAL DATA:  Altered mental status, back pain. EXAM: PORTABLE CHEST 1 VIEW COMPARISON:  03/06/2023. FINDINGS: Trachea is midline. Heart size within normal limits. Thoracic aorta is calcified. Lungs are clear. No pleural fluid. IMPRESSION: No acute findings. Electronically Signed   By: Newell Eke M.D.   On: 10/25/2023 16:10   DG BONE DENSITY (DXA) Result Date: 10/11/2023 EXAM: DUAL X-RAY ABSORPTIOMETRY (DXA) FOR BONE MINERAL DENSITY 10/11/2023 8:58 am CLINICAL DATA:  78 year old Female Postmenopausal. TECHNIQUE: An axial (e.g., hips, spine) and/or appendicular (e.g., radius) exam was performed, as appropriate, using GE Psychologist, sport and exercise at [facility]. Images are obtained for bone mineral density measurement and are not obtained for diagnostic purposes. [id number] Exclusions: Lumbar spine due to advanced degenerative changes. COMPARISON:  11/17/2019. FINDINGS: Scan quality: Good. LEFT FEMORAL NECK: BMD (in g/cm2): 0.788 T-score: -1.8 Z-score: 0.3 LEFT TOTAL HIP: BMD (in g/cm2): 0.796 T-score: -1.7 Z-score: 0.2 RIGHT FEMORAL NECK: BMD (in g/cm2): 0.707 T-score: -2.4 Z-score: -0.3 RIGHT TOTAL HIP: BMD (in g/cm2): 0.756 T-score: -2.0 Z-score: -0.1 DUAL-FEMUR TOTAL MEAN: Rate of change from previous exam: -6.5 %  LEFT FOREARM (RADIUS 33%): BMD (in g/cm2): 0.572 T-score: -1.9 Z-score: 0.7 Rate of change from previous exam: No significant rate of change from previous exam. FRAX 10-YEAR PROBABILITY OF FRACTURE: 10-year fracture risk is performed using the [facility] calculator based on patient-reported risk factors. Major osteoporotic fracture: 24.9% Hip fracture: 7.8% Other situations known to alter the reliability of the FRAX score should be considered when making treatment decisions, including chronic glucocorticoid use and past treatments. Further guidance on treatment can be found at the [facility]'s website  [email or web address] IMPRESSION: Osteopenia based on BMD. Fracture risk is increased. Increased risk is based on low BMD, FRAX calculation. RECOMMENDATIONS: 1. All patients should optimize calcium  and vitamin D  intake. 2. Consider FDA-approved medical therapies in postmenopausal women and men aged 44 years and older, based on the following: - A hip or vertebral (clinical or morphometric) fracture - T-score less than or equal to -2.5 and secondary causes have been excluded. - Low bone mass (T-score between -1.0 and -2.5) and a 10-year probability of a hip fracture greater than or equal to 3% or a 10-year probability of a major osteoporosis-related fracture greater than or equal to 20% based on the [place] -adapted WHO algorithm. - Clinician judgment and/or patient preferences may indicate treatment for people with 10-year fracture probabilities above or below these levels 3. Patients with diagnosis of osteoporosis or at high risk for fracture should have regular bone mineral density tests. For patients eligible for Medicare, routine testing is allowed once every 2 years. The testing frequency can be increased to one year for patients who have rapidly progressing disease, those who are receiving or discontinuing medical therapy to restore bone mass, or have additional risk factors. Electronically Signed   By: Reyes Phi M.D.   On: 10/11/2023 12:16    Alm Schneider, DO  [facility]  If 7PM-7AM, please contact night-coverage [email or web address] Password [email or web address] 11/04/2023, 5:48 PM   LOS: 9 days

## 2023-11-04 NOTE — TOC Progression Note (Addendum)
 Transition of Care [facility]) - Progression Note    Patient Details  Name: Heidi Fuentes MRN: [id number] Date of Birth: 30-Apr-1945  [facility] CM/SW Contact  Lucie Lunger, [place] Phone Number: 11/04/2023, 11:26 AM  Clinical Narrative:    CSW updated by [place] [employer] that interim guardianship paperwork was completed and submitted to supervisor for final review. Once reviewed paperwork will be sent to clerk of court for processing. CSW requested to be updated as soon as possible. TOC to follow.   Addendum: CSW updated by Elease with [place] [employer] who requested notes be sent to her email that can go with the packet for the court. CSW sent notes to [facility] securely. TOC to follow.   Expected Discharge Plan: Skilled Nursing Facility Barriers to Discharge: DSS Guardianship, Unsafe home situation  DSS Guardianship request start date: 10/27/23 DSS Service county:  [place], transferred to [place] due to conflict of interest) DSS Date of decision notification: 10/28/23 DSS Guardianship decision: Substantiated      Expected Discharge Plan and Services In-house Referral: Clinical Social Work Discharge Planning Services: NA Post Acute Care Choice: Nursing Home, Skilled Nursing Facility Living arrangements for the past 2 months: Single Family Home                                       Social Drivers of Health (SDOH) Interventions SDOH Screenings   Food Insecurity: No Food Insecurity (10/25/2023)  Housing: Low Risk  (10/25/2023)  Transportation Needs: No Transportation Needs (10/25/2023)  Utilities: Not At Risk (10/25/2023)  Social Connections: Unknown (10/25/2023)  Tobacco Use: Low Risk  (10/25/2023)    Readmission Risk Interventions    11/03/2023   12:54 PM 10/29/2023   10:31 AM 10/28/2023   10:36 AM  Readmission Risk Prevention Plan  Transportation Screening Complete Complete Complete  PCP or Specialist Appt within 5-7 Days   Complete  Home Care Screening   Complete Complete  Medication Review (RN CM)  Complete Complete  HRI or Home Care Consult Complete    Social Work Consult for Recovery Care Planning/Counseling Complete    Palliative Care Screening Not Applicable    Medication Review Oceanographer) Complete

## 2023-11-05 DIAGNOSIS — G9340 Encephalopathy, unspecified: Secondary | ICD-10-CM | POA: Diagnosis not present

## 2023-11-05 DIAGNOSIS — F3131 Bipolar disorder, current episode depressed, mild: Secondary | ICD-10-CM | POA: Diagnosis not present

## 2023-11-05 DIAGNOSIS — F0390 Unspecified dementia without behavioral disturbance: Secondary | ICD-10-CM | POA: Diagnosis not present

## 2023-11-05 LAB — FOLATE RBC
Folate, Hemolysate: >620 ng/mL
Folate, RBC: >1435 ng/mL (ref >498)
Hematocrit: 43.2 % (ref 34.0–46.6)

## 2023-11-05 MED ORDER — KETOROLAC TROMETHAMINE 15 MG/ML IJ SOLN
15.0000 mg | Freq: Once | INTRAMUSCULAR | Status: AC
  Administered 2023-11-05: 15 mg via INTRAVENOUS
  Filled 2023-11-05: qty 1

## 2023-11-05 NOTE — Plan of Care (Signed)

## 2023-11-05 NOTE — Progress Notes (Signed)
 Physical Therapy Treatment Patient Details Name: Heidi Fuentes MRN: [id number] DOB: 01-Jun-1945 Today's Date: 11/05/2023   History of Present Illness Heidi Fuentes is a 78 y.o. female with medical history significant for asthma, bipolar disorder, hypertension, chronic pain, and bronchiectasis.  Patient was brought to the ED via EMS for reports of altered mental status.  Patient is alone but has a caregiver, she called patient's son the patient was not her normal self, and he said to bring patient to the ED.  The time of my evaluation, patient is awake and alert and oriented to person, but not time, she is unable to tell me why she is here. She appears to answer other questions appropriately.  She denies difficulty breathing, no cough, no vomiting or diarrhea, no weakness of her extremities, endorses good oral intake.     She was hospitalized back in February- 2/1 to 2/6 also for altered mental status thought secondary to COVID infection and  hypoxia.    PT Comments  Patient agreeable for therapy and demonstrates improvement for following directions. Patient demonstrates fair/good return for transferring to/from commode and washing hands standing in front of sink using RW, tolerated increase endurance/distance for gait training without loss of balance, but limited mostly due to fatigue. Patient tolerated sitting up in chair after therapy. Patient will benefit from continued skilled physical therapy in hospital and recommended venue below to increase strength, balance, endurance for safe ADLs and gait.      If plan is discharge home, recommend the following: A lot of help with walking and/or transfers;A lot of help with bathing/dressing/bathroom;Help with stairs or ramp for entrance;Assistance with cooking/housework;Assist for transportation   Can travel by private vehicle     No  Equipment Recommendations  None recommended by PT    Recommendations for Other Services       Precautions /  Restrictions Precautions Precautions: Fall Recall of Precautions/Restrictions: Impaired Restrictions Weight Bearing Restrictions Per Provider Order: No     Mobility  Bed Mobility Overal bed mobility: Needs Assistance Bed Mobility: Supine to Sit     Supine to sit: Min assist, Mod assist     General bed mobility comments: increased time, labored movement    Transfers Overall transfer level: Needs assistance Equipment used: Rolling walker (2 wheels) Transfers: Sit to/from Stand, Bed to chair/wheelchair/BSC Sit to Stand: Min assist   Step pivot transfers: Min assist       General transfer comment: increased BLE strength for completing sit to stands, transfers    Ambulation/Gait Ambulation/Gait assistance: Min assist Gait Distance (Feet): 40 Feet Assistive device: Rollator (4 wheels) Gait Pattern/deviations: Decreased step length - right, Decreased step length - left, Decreased stride length Gait velocity: decreased     General Gait Details: increased endurance/distance for gait training using RW without loss of balance, limited mostly due to fatigue   Stairs             Wheelchair Mobility     Tilt Bed    Modified Rankin (Stroke Patients Only)       Balance Overall balance assessment: Needs assistance Sitting-balance support: Feet supported, No upper extremity supported Sitting balance-Leahy Scale: Good Sitting balance - Comments: seated at EOB   Standing balance support: Reliant on assistive device for balance, Bilateral upper extremity supported, During functional activity Standing balance-Leahy Scale: Fair Standing balance comment: using RW  Communication Communication Factors Affecting Communication: Difficulty expressing self  Cognition Arousal: Alert     PT - Cognitive impairments: Initiation, Attention, No family/caregiver present to determine baseline                       PT - Cognition  Comments: improvement for following directions with occasional repeated verbal/tactile cueing Following commands: Impaired Following commands impaired: Follows one step commands with increased time    Cueing Cueing Techniques: Verbal cues, Tactile cues  Exercises General Exercises - Lower Extremity Ankle Circles/Pumps: AROM, Seated, Strengthening, Both, 10 reps Long Arc Quad: PROM, Strengthening, Seated, Both, 10 reps, AROM Hip Flexion/Marching: Seated, AAROM, Strengthening, Both, 10 reps    General Comments        Pertinent Vitals/Pain Pain Assessment Pain Assessment: No/denies pain    Home Living                          Prior Function            PT Goals (current goals can now be found in the care plan section) Acute Rehab PT Goals Patient Stated Goal: Return home PT Goal Formulation: With patient Time For Goal Achievement: 11/09/23 Progress towards PT goals: Progressing toward goals    Frequency    Min 3X/week      PT Plan      Co-evaluation              AM-PAC PT 6 Clicks Mobility   Outcome Measure  Help needed turning from your back to your side while in a flat bed without using bedrails?: A Little Help needed moving from lying on your back to sitting on the side of a flat bed without using bedrails?: A Little Help needed moving to and from a bed to a chair (including a wheelchair)?: A Little Help needed standing up from a chair using your arms (e.g., wheelchair or bedside chair)?: A Little Help needed to walk in hospital room?: A Little Help needed climbing 3-5 steps with a railing? : A Lot 6 Click Score: 17    End of Session   Activity Tolerance: Patient tolerated treatment well;Patient limited by fatigue Patient left: in chair;with call bell/phone within reach;with chair alarm set Nurse Communication: Mobility status PT Visit Diagnosis: Unsteadiness on feet (R26.81);Muscle weakness (generalized) (M62.81);Difficulty in walking,  not elsewhere classified (R26.2);Other abnormalities of gait and mobility (R26.89)     Time: 8956-8886 PT Time Calculation (min) (ACUTE ONLY): 30 min  Charges:    $Gait Training: 8-22 mins $Therapeutic Exercise: 8-22 mins PT General Charges $$ ACUTE PT VISIT: 1 Visit                     3:21 PM, 11/05/23 Lynwood Music, MPT Physical Therapist with [facility] [phone] office [phone] mobile phone

## 2023-11-05 NOTE — Progress Notes (Signed)
 PROGRESS NOTE  Heidi Fuentes FMW:[id number] DOB: [birth year] DOA: 10/25/2023 PCP: [name], DO  Brief History:  78 y.o. female with medical history significant for asthma, bipolar disorder, hypertension, chronic pain/fibromyalgia, and bronchiectasis presented with altered mental status.  At the time of evaluation by hospitalist on admission, patient was awake and alert and oriented to person but not time.  She was slightly tachycardic, tachypneic.  UA not suggestive of UTI.  Ammonia 18.  Head CT negative for acute abnormality.  Portable chest x-ray unremarkable.  MRI of brain was negative for any acute abnormality.  During early part of the hospitalization, the patient remained primarily agitated.  She refused to take her medications and would frequently spit them out.  Her oral intake was poor.  Her metabolic derangements were optimized.  Workup for reversible causes including serum B12, folic acid , TSH, and ammonia were largely unremarkable. Many of the patient's medications were discontinued including some of her psychotropic medications as the patient was not properly taking them or taking them at all at home. As time progressed, the patient was more alert and more willing to take her medications.  She was more communicative although remained pleasantly confused.  She would have episodes of argumentativeness but spoke understandably.  Social services was contacted to assist with guardianship.   Assessment/Plan:  Acute metabolic or toxic encephalopathy and Delirium  -likely due to polypharmacy in setting of cognitive impairment -mental status overall improved but remains pleasantly confused and refusing to take medications -pt has underlying cognitive impairement/Major neurocognitive disorder -CT and MRI of brain negative as above.  No focal neurologic deficit.   -UA neg for UTI.   -Patient is on multiple psychotropic medications which can attribute to altered mental state  but pt not likely taking them>>d/c buproprion for now -Patient apparently stopped taking Lamictal  by herself.   -Ammonia normal.  B12 (286), folic acid  (17.6) and TSH (1.425). -IM B12 given x 2 -Fall precautions.  PT eval - with recommendation for SNF placement, TOC consulted and requested to assist with placement. -tried to restart lamictal  she was taking at home but had stopped it per reporting, will start with lower dose and see if she tolerates it - - pt refusing to take meds at times and spitting them out  - due to uncontrolled bipolar disorder and refusing to take oral meds, I requested a TTS consultation for medical management and to determine if she needs inpatient psychiatric hospitalization - pt has been medically cleared and can have TTS consultation t - [facility] consult was completed on 9/29 and they indicate that inpatient psych hospitalization not required - they ordered haloperidol  and today she is very sedated; added IV fluids due to not eating/drinking   Bipolar disorder -- uncontrolled and she is refusing to take meds, spitting them out -- TTS consult requested for medical mgmt and to determine if she needs inpatient psychiatric hospitalization -- TTS deferred to [facility] team who saw patient 9/29, see consult note and recommendations--does not feel pt needs inpatient [facility]   Hypothyroidism -Continue levothyroxine  -TSH 1.425   Hypertension -Blood pressure stable.   -Continue metoprolol -- pt recently had been refusing to take and spitting out meds for last several days -d/c losartan    GERD Continue Protonix  if pt takes it   COPD - Continue current bronchodilators   Chronic pain - Apparently she is not taking oxycodone  anymore.  -Outpatient follow-up with PCP and/or pain management  Anxiety/depression -carefully restarting home meds--however she has refused them, spit them out   Physical deconditioning - PT eval--requested and recommendation is for SNF - TOC working on  SNF placement    Hypokalemia - Resolved     Family Communication:  no Family at bedside   Consultants:  TTS/[facility]   Code Status:  FULL   DVT Prophylaxis:  [place] Lovenox      Procedures: As Listed in Progress Note Above   Antibiotics: None        Subjective: Limited but patient denies any chest pain, shortness breath, abdominal pain, headache.  She complains of chronic back pain.  She is able to stand and make transfers.  Objective: Vitals:   11/04/23 1410 11/04/23 2012 11/05/23 0610 11/05/23 0842  BP: (!) 123/56 (!) 119/57 (!) 144/74 133/71  Pulse: 67 72 (!) 57 62  Resp: 18 18 18 20   Temp: (!) 97.5 F (36.4 C) 97.7 F (36.5 C) 97.7 F (36.5 C)   TempSrc: Oral Oral Oral   SpO2: 97% 99% 95%   Weight:   49.6 kg   Height:       No intake or output data in the 24 hours ending 11/05/23 1734 Weight change: 1 kg Exam:  General:  Pt is alert, follows commands appropriately, not in acute distress HEENT: No icterus, No thrush, No neck mass, [place]/AT Cardiovascular: RRR, S1/S2, no rubs, no gallops Respiratory: CTA bilaterally, no wheezing, no crackles, no rhonchi Abdomen: Soft/+BS, non tender, non distended, no guarding Extremities: No edema, No lymphangitis, No petechiae, No rashes, no synovitis   Data Reviewed: I have personally reviewed following labs and imaging studies Basic Metabolic Panel: Recent Labs  Lab 11/04/23 0456  NA 142  K 3.7  CL 104  CO2 23  GLUCOSE 74  BUN 28*  CREATININE 0.65  CALCIUM  9.5  MG 2.3  PHOS 3.8   Liver Function Tests: No results for input(s): AST, ALT, ALKPHOS, BILITOT, PROT, ALBUMIN in the last 168 hours. No results for input(s): LIPASE, AMYLASE in the last 168 hours. No results for input(s): AMMONIA in the last 168 hours. Coagulation Profile: No results for input(s): INR, PROTIME in the last 168 hours. CBC: Recent Labs  Lab 11/02/23 1352 11/04/23 0456  WBC 6.8  --   HGB 14.0  --   HCT 42.5 43.2   MCV 98.6  --   PLT 233  --    Cardiac Enzymes: No results for input(s): CKTOTAL, CKMB, CKMBINDEX, TROPONINI in the last 168 hours. BNP: Invalid input(s): POCBNP CBG: No results for input(s): GLUCAP in the last 168 hours. HbA1C: No results for input(s): HGBA1C in the last 72 hours. Urine analysis:    Component Value Date/Time   COLORURINE YELLOW 10/25/2023 1521   APPEARANCEUR CLEAR 10/25/2023 1521   APPEARANCEUR Cloudy 06/02/2012 1042   LABSPEC 1.027 10/25/2023 1521   LABSPEC 1.017 06/02/2012 1042   PHURINE 5.0 10/25/2023 1521   GLUCOSEU NEGATIVE 10/25/2023 1521   GLUCOSEU Negative 06/02/2012 1042   HGBUR NEGATIVE 10/25/2023 1521   BILIRUBINUR NEGATIVE 10/25/2023 1521   BILIRUBINUR Negative 06/02/2012 1042   KETONESUR 20 (A) 10/25/2023 1521   PROTEINUR 30 (A) 10/25/2023 1521   UROBILINOGEN 0.2 12/11/2014 1429   NITRITE NEGATIVE 10/25/2023 1521   LEUKOCYTESUR NEGATIVE 10/25/2023 1521   LEUKOCYTESUR 3+ 06/02/2012 1042   Sepsis Labs: @LABRCNTIP (procalcitonin:4,lacticidven:4) )No results found for this or any previous visit (from the past 240 hours).   Scheduled Meds:  (feeding supplement) PROSource Plus  30 mL Oral  TID BM   enoxaparin  (LOVENOX ) injection  40 mg Subcutaneous Q24H   feeding supplement  237 mL Oral BID BM   FLUoxetine   40 mg Oral Daily   fluticasone  furoate-vilanterol  1 puff Inhalation Daily   gabapentin   300 mg Oral TID   lamoTRIgine   25 mg Oral BID   latanoprost   1 drop Both Eyes QHS   levothyroxine   25 mcg Oral QAC breakfast   melatonin  3 mg Oral QHS   metoprolol  tartrate  12.5 mg Oral BID   multivitamin with minerals  1 tablet Oral Daily   pantoprazole   40 mg Oral Daily   Continuous Infusions:  Procedures/Studies: MR BRAIN WO CONTRAST Result Date: 10/25/2023 CLINICAL DATA:  Provided history: Mental status change, unknown cause. EXAM: MRI HEAD WITHOUT CONTRAST TECHNIQUE: Multiplanar, multiecho pulse sequences of the brain and  surrounding structures were obtained without intravenous contrast. COMPARISON:  Head CT 10/25/2023.  Brain MRI 07/07/2006. FINDINGS: Intermittently motion degraded examination. Most notably, the sagittal T1 sequence is severely motion degraded, the axial T2 sequence is moderate-to-severely motion degraded, the axial FLAIR sequence is severely motion degraded and the axial T1 sequence is moderately motion degraded. Within this limitation, findings are as follows. Brain: Generalized cerebral atrophy. Multifocal T2 FLAIR hyperintense signal abnormality within the cerebral white matter, nonspecific but compatible with moderate chronic small vessel ischemic disease. No evidence of acute infarct. No intracranial mass or extra-axial fluid collection is identified. No chronic intracranial blood products. No midline shift. Vascular: Maintained flow voids within the proximal large arterial vessels. Skull and upper cervical spine: No focal worrisome marrow lesion. Multilevel facet arthropathy within the cervical spine at the visible levels. Sinuses/Orbits: No mass or acute finding within the imaged orbits. Redemonstrated chronic, depressed fracture deformity of the left orbital floor. No significant paranasal sinus disease. IMPRESSION: 1. Significantly motion degraded examination. Within this limitation, findings are as follows. 2. No acute intracranial abnormality is identified. The diffusion-weighted imaging is of good quality and there is no evidence of an acute infarct. 3. Moderate chronic small vessel ischemic changes within the cerebral white matter. 4. Generalized cerebral atrophy. Electronically Signed   By: Rockey Childs D.O.   On: 10/25/2023 20:13   CT HEAD WO CONTRAST Result Date: 10/25/2023 CLINICAL DATA:  Altered mental status.  Chronic UTIs. EXAM: CT HEAD WITHOUT CONTRAST TECHNIQUE: Contiguous axial images were obtained from the base of the skull through the vertex without intravenous contrast. RADIATION DOSE  REDUCTION: This exam was performed according to the departmental dose-optimization program which includes automated exposure control, adjustment of the mA and/or kV according to patient size and/or use of iterative reconstruction technique. COMPARISON:  03/07/2023 FINDINGS: Brain: Ventricles, cisterns and other CSF spaces are normal. Mild chronic ischemic microvascular disease is present. No mass, mass effect, shift of midline structures or acute hemorrhage. No acute infarction. Vascular: No hyperdense vessel or unexpected calcification. Skull: Normal. Negative for fracture or focal lesion. Sinuses/Orbits: No acute finding. Other: None. IMPRESSION: 1. No acute findings. 2. Mild chronic ischemic microvascular disease. Electronically Signed   By: Toribio Agreste M.D.   On: 10/25/2023 16:29   DG Chest Port 1 View Result Date: 10/25/2023 CLINICAL DATA:  Altered mental status, back pain. EXAM: PORTABLE CHEST 1 VIEW COMPARISON:  03/06/2023. FINDINGS: Trachea is midline. Heart size within normal limits. Thoracic aorta is calcified. Lungs are clear. No pleural fluid. IMPRESSION: No acute findings. Electronically Signed   By: Newell Eke M.D.   On: 10/25/2023 16:10   DG  BONE DENSITY (DXA) Result Date: 10/11/2023 EXAM: DUAL X-RAY ABSORPTIOMETRY (DXA) FOR BONE MINERAL DENSITY 10/11/2023 8:58 am CLINICAL DATA:  78 year old Female Postmenopausal. TECHNIQUE: An axial (e.g., hips, spine) and/or appendicular (e.g., radius) exam was performed, as appropriate, using [employer] Psych[employer], sport and exercise at [facility]. Images are obtained for bone mineral density measurement and are not obtained for diagnostic purposes. [id number] Exclusions: Lumbar spine due to advanced degenerative changes. COMPARISON:  11/17/2019. FINDINGS: Scan quality: Good. LEFT FEMORAL NECK: BMD (in g/cm2): 0.788 T-score: -1.8 Z-score: 0.3 LEFT TOTAL HIP: BMD (in g/cm2): 0.796 T-score: -1.7 Z-score: 0.2 RIGHT FEMORAL NECK: BMD (in g/cm2): 0.707 T-score:  -2.4 Z-score: -0.3 RIGHT TOTAL HIP: BMD (in g/cm2): 0.756 T-score: -2.0 Z-score: -0.1 DUAL-FEMUR TOTAL MEAN: Rate of change from previous exam: -6.5 % LEFT FOREARM (RADIUS 33%): BMD (in g/cm2): 0.572 T-score: -1.9 Z-score: 0.7 Rate of change from previous exam: No significant rate of change from previous exam. FRAX 10-YEAR PROBABILITY OF FRACTURE: 10-year fracture risk is performed using the [facility] calculator based on patient-reported risk factors. Major osteoporotic fracture: 24.9% Hip fracture: 7.8% Other situations known to alter the reliability of the FRAX score should be considered when making treatment decisions, including chronic glucocorticoid use and past treatments. Further guidance on treatment can be found at the [facility] Osteoporosis Foundation's website [email or web address] IMPRESSION: Osteopenia based on BMD. Fracture risk is increased. Increased risk is based on low BMD, FRAX calculation. RECOMMENDATIONS: 1. All patients should optimize calcium  and vitamin D  intake. 2. Consider FDA-approved medical therapies in postmenopausal women and men aged 25 years and older, based on the following: - A hip or vertebral (clinical or morphometric) fracture - T-score less than or equal to -2.5 and secondary causes have been excluded. - Low bone mass (T-score between -1.0 and -2.5) and a 10-year probability of a hip fracture greater than or equal to 3% or a 10-year probability of a major osteoporosis-related fracture greater than or equal to 20% based on the [place] -adapted WHO algorithm. - Clinician judgment and/or patient preferences may indicate treatment for people with 10-year fracture probabilities above or below these levels 3. Patients with diagnosis of osteoporosis or at high risk for fracture should have regular bone mineral density tests. For patients eligible for Medicare, routine testing is allowed once every 2 years. The testing frequency can be increased to one year for patients who have  rapidly progressing disease, those who are receiving or discontinuing medical therapy to restore bone mass, or have additional risk factors. Electronically Signed   By: Reyes Phi M.D.   On: 10/11/2023 12:16    Alm Schneider, DO  [facility]  If 7PM-7AM, please contact night-coverage [email or web address] Password [email or web address] 11/05/2023, 5:34 PM   LOS: 10 days

## 2023-11-05 NOTE — Plan of Care (Signed)

## 2023-11-05 NOTE — Progress Notes (Signed)
 Mobility Specialist Progress Note:    11/05/23 1400  Mobility  Activity Ambulated with assistance  Level of Assistance Modified independent, requires aide device or extra time  Assistive Device Front wheel walker  Distance Ambulated (ft) 10 ft  Range of Motion/Exercises Active;All extremities  Activity Response Tolerated well  Mobility Referral Yes  Mobility visit 1 Mobility  Mobility Specialist Start Time (ACUTE ONLY) 1400  Mobility Specialist Stop Time (ACUTE ONLY) 1445  Mobility Specialist Time Calculation (min) (ACUTE ONLY) 45 min   Pt received standing at sink, agreeable to walk back to chair. ModI to ambulate with RW. Tolerated well, asx throughout. All needs met.  Zahrah Sutherlin Mobility Specialist Please contact via Special educational needs teacher or  Rehab office at [phone]

## 2023-11-06 DIAGNOSIS — F0390 Unspecified dementia without behavioral disturbance: Secondary | ICD-10-CM | POA: Diagnosis not present

## 2023-11-06 DIAGNOSIS — G9340 Encephalopathy, unspecified: Secondary | ICD-10-CM | POA: Diagnosis not present

## 2023-11-06 DIAGNOSIS — F3131 Bipolar disorder, current episode depressed, mild: Secondary | ICD-10-CM | POA: Diagnosis not present

## 2023-11-06 NOTE — Plan of Care (Signed)

## 2023-11-06 NOTE — Progress Notes (Signed)
 PROGRESS NOTE  Heidi Fuentes FMW:[id number] DOB: [birth year] DOA: 10/25/2023 PCP: [name], DO  Brief History:  78 y.o. female with medical history significant for asthma, bipolar disorder, hypertension, chronic pain/fibromyalgia, and bronchiectasis presented with altered mental status.  At the time of evaluation by hospitalist on admission, patient was awake and alert and oriented to person but not time.  She was slightly tachycardic, tachypneic.  UA not suggestive of UTI.  Ammonia 18.  Head CT negative for acute abnormality.  Portable chest x-ray unremarkable.  MRI of brain was negative for any acute abnormality.  During early part of the hospitalization, the patient remained primarily agitated.  She refused to take her medications and would frequently spit them out.  Her oral intake was poor.  Her metabolic derangements were optimized.  Workup for reversible causes including serum B12, folic acid , TSH, and ammonia were largely unremarkable. Many of the patient's medications were discontinued including some of her psychotropic medications as the patient was not properly taking them or taking them at all at home. As time progressed, the patient was more alert and more willing to take her medications.  She was more communicative although remained pleasantly confused.  She would have episodes of argumentativeness but spoke understandably.  Social services was contacted to assist with guardianship.   Assessment/Plan: Acute metabolic or toxic encephalopathy and Delirium  -likely due to polypharmacy in setting of cognitive impairment -mental status overall improved but remains pleasantly confused and refusing to take medications -pt has underlying cognitive impairement/Major neurocognitive disorder -CT and MRI of brain negative as above.  No focal neurologic deficit.   -UA neg for UTI.   -Patient is on multiple psychotropic medications which can attribute to altered mental state but  pt not likely taking them>>d/c buproprion for now -Patient apparently stopped taking Lamictal  by herself.   -Ammonia normal.  B12 (286), folic acid  (17.6) and TSH (1.425). -IM B12 given x 2 -Fall precautions.  PT eval - with recommendation for SNF placement, TOC consulted and requested to assist with placement. -tried to restart lamictal  she was taking at home but had stopped it per reporting, will start with lower dose and see if she tolerates it - - pt refusing to take meds at times and spitting them out  - due to uncontrolled bipolar disorder and refusing to take oral meds, I requested a TTS consultation for medical management and to determine if she needs inpatient psychiatric hospitalization - pt has been medically cleared and can have TTS consultation t - [facility] consult was completed on 9/29 and they indicate that inpatient psych hospitalization not required - they ordered haloperidol  and today she is very sedated; added IV fluids due to not eating/drinking   Bipolar disorder -- uncontrolled and she is refusing to take meds, spitting them out -- TTS consult requested for medical mgmt and to determine if she needs inpatient psychiatric hospitalization -- TTS deferred to [facility] team who saw patient 9/29, see consult note and recommendations--does not feel pt needs inpatient [facility]   Hypothyroidism -Continue levothyroxine  -TSH 1.425   Hypertension -Blood pressure stable.   -Continue metoprolol -- pt recently had been refusing to take and spitting out meds for last several days -d/c losartan    GERD Continue Protonix  if pt takes it   COPD - Continue current bronchodilators   Chronic pain - Apparently she is not taking oxycodone  anymore.  -Outpatient follow-up with PCP and/or pain management  Anxiety/depression -carefully restarting home meds--however she has refused them, spit them out   Physical deconditioning - PT eval--requested and recommendation is for SNF - TOC working on SNF  placement    Hypokalemia - Resolved     Family Communication:  no Family at bedside   Consultants:  TTS/[facility]   Code Status:  FULL   DVT Prophylaxis:  [place] Lovenox      Procedures: As Listed in Progress Note Above   Antibiotics: None       Subjective: Patient denies fevers, chills, headache, chest pain, dyspnea, nausea, vomiting, diarrhea, abdominal pain  Objective: Vitals:   11/06/23 0524 11/06/23 0815 11/06/23 0857 11/06/23 1349  BP: (!) 144/74  (!) 141/61 (!) 123/57  Pulse: (!) 54   61  Resp: 11   12  Temp:    97.7 F (36.5 C)  TempSrc:    Oral  SpO2: 98% 97%  99%  Weight: 48.8 kg     Height:        Intake/Output Summary (Last 24 hours) at 11/06/2023 1659 Last data filed at 11/06/2023 1230 Gross per 24 hour  Intake 360 ml  Output --  Net 360 ml   Weight change: -0.8 kg Exam:  General:  Pt is alert, follows commands appropriately, not in acute distress HEENT: No icterus, No thrush, No neck mass, [place]/AT Cardiovascular: RRR, S1/S2, no rubs, no gallops Respiratory: bibasilar rales. No wheeze Abdomen: Soft/+BS, non tender, non distended, no guarding Extremities: No edema, No lymphangitis, No petechiae, No rashes, no synovitis   Data Reviewed: I have personally reviewed following labs and imaging studies Basic Metabolic Panel: Recent Labs  Lab 11/04/23 0456  NA 142  K 3.7  CL 104  CO2 23  GLUCOSE 74  BUN 28*  CREATININE 0.65  CALCIUM  9.5  MG 2.3  PHOS 3.8   Liver Function Tests: No results for input(s): AST, ALT, ALKPHOS, BILITOT, PROT, ALBUMIN in the last 168 hours. No results for input(s): LIPASE, AMYLASE in the last 168 hours. No results for input(s): AMMONIA in the last 168 hours. Coagulation Profile: No results for input(s): INR, PROTIME in the last 168 hours. CBC: Recent Labs  Lab 11/02/23 1352 11/04/23 0456  WBC 6.8  --   HGB 14.0  --   HCT 42.5 43.2  MCV 98.6  --   PLT 233  --    Cardiac Enzymes: No  results for input(s): CKTOTAL, CKMB, CKMBINDEX, TROPONINI in the last 168 hours. BNP: Invalid input(s): POCBNP CBG: No results for input(s): GLUCAP in the last 168 hours. HbA1C: No results for input(s): HGBA1C in the last 72 hours. Urine analysis:    Component Value Date/Time   COLORURINE YELLOW 10/25/2023 1521   APPEARANCEUR CLEAR 10/25/2023 1521   APPEARANCEUR Cloudy 06/02/2012 1042   LABSPEC 1.027 10/25/2023 1521   LABSPEC 1.017 06/02/2012 1042   PHURINE 5.0 10/25/2023 1521   GLUCOSEU NEGATIVE 10/25/2023 1521   GLUCOSEU Negative 06/02/2012 1042   HGBUR NEGATIVE 10/25/2023 1521   BILIRUBINUR NEGATIVE 10/25/2023 1521   BILIRUBINUR Negative 06/02/2012 1042   KETONESUR 20 (A) 10/25/2023 1521   PROTEINUR 30 (A) 10/25/2023 1521   UROBILINOGEN 0.2 12/11/2014 1429   NITRITE NEGATIVE 10/25/2023 1521   LEUKOCYTESUR NEGATIVE 10/25/2023 1521   LEUKOCYTESUR 3+ 06/02/2012 1042   Sepsis Labs: @LABRCNTIP (procalcitonin:4,lacticidven:4) )No results found for this or any previous visit (from the past 240 hours).   Scheduled Meds:  (feeding supplement) PROSource Plus  30 mL Oral TID BM   enoxaparin  (LOVENOX ) injection  40 mg Subcutaneous Q24H   feeding supplement  237 mL Oral BID BM   FLUoxetine   40 mg Oral Daily   fluticasone  furoate-vilanterol  1 puff Inhalation Daily   gabapentin   300 mg Oral TID   lamoTRIgine   25 mg Oral BID   latanoprost   1 drop Both Eyes QHS   levothyroxine   25 mcg Oral QAC breakfast   melatonin  3 mg Oral QHS   metoprolol  tartrate  12.5 mg Oral BID   multivitamin with minerals  1 tablet Oral Daily   pantoprazole   40 mg Oral Daily   Continuous Infusions:  Procedures/Studies: MR BRAIN WO CONTRAST Result Date: 10/25/2023 CLINICAL DATA:  Provided history: Mental status change, unknown cause. EXAM: MRI HEAD WITHOUT CONTRAST TECHNIQUE: Multiplanar, multiecho pulse sequences of the brain and surrounding structures were obtained without intravenous  contrast. COMPARISON:  Head CT 10/25/2023.  Brain MRI 07/07/2006. FINDINGS: Intermittently motion degraded examination. Most notably, the sagittal T1 sequence is severely motion degraded, the axial T2 sequence is moderate-to-severely motion degraded, the axial FLAIR sequence is severely motion degraded and the axial T1 sequence is moderately motion degraded. Within this limitation, findings are as follows. Brain: Generalized cerebral atrophy. Multifocal T2 FLAIR hyperintense signal abnormality within the cerebral white matter, nonspecific but compatible with moderate chronic small vessel ischemic disease. No evidence of acute infarct. No intracranial mass or extra-axial fluid collection is identified. No chronic intracranial blood products. No midline shift. Vascular: Maintained flow voids within the proximal large arterial vessels. Skull and upper cervical spine: No focal worrisome marrow lesion. Multilevel facet arthropathy within the cervical spine at the visible levels. Sinuses/Orbits: No mass or acute finding within the imaged orbits. Redemonstrated chronic, depressed fracture deformity of the left orbital floor. No significant paranasal sinus disease. IMPRESSION: 1. Significantly motion degraded examination. Within this limitation, findings are as follows. 2. No acute intracranial abnormality is identified. The diffusion-weighted imaging is of good quality and there is no evidence of an acute infarct. 3. Moderate chronic small vessel ischemic changes within the cerebral white matter. 4. Generalized cerebral atrophy. Electronically Signed   By: Rockey Childs D.O.   On: 10/25/2023 20:13   CT HEAD WO CONTRAST Result Date: 10/25/2023 CLINICAL DATA:  Altered mental status.  Chronic UTIs. EXAM: CT HEAD WITHOUT CONTRAST TECHNIQUE: Contiguous axial images were obtained from the base of the skull through the vertex without intravenous contrast. RADIATION DOSE REDUCTION: This exam was performed according to the  departmental dose-optimization program which includes automated exposure control, adjustment of the mA and/or kV according to patient size and/or use of iterative reconstruction technique. COMPARISON:  03/07/2023 FINDINGS: Brain: Ventricles, cisterns and other CSF spaces are normal. Mild chronic ischemic microvascular disease is present. No mass, mass effect, shift of midline structures or acute hemorrhage. No acute infarction. Vascular: No hyperdense vessel or unexpected calcification. Skull: Normal. Negative for fracture or focal lesion. Sinuses/Orbits: No acute finding. Other: None. IMPRESSION: 1. No acute findings. 2. Mild chronic ischemic microvascular disease. Electronically Signed   By: Toribio Agreste M.D.   On: 10/25/2023 16:29   DG Chest Port 1 View Result Date: 10/25/2023 CLINICAL DATA:  Altered mental status, back pain. EXAM: PORTABLE CHEST 1 VIEW COMPARISON:  03/06/2023. FINDINGS: Trachea is midline. Heart size within normal limits. Thoracic aorta is calcified. Lungs are clear. No pleural fluid. IMPRESSION: No acute findings. Electronically Signed   By: Newell Eke M.D.   On: 10/25/2023 16:10   DG BONE DENSITY (DXA) Result Date: 10/11/2023 EXAM: DUAL  X-RAY ABSORPTIOMETRY (DXA) FOR BONE MINERAL DENSITY 10/11/2023 8:58 am CLINICAL DATA:  78 year old Female Postmenopausal. TECHNIQUE: An axial (e.g., hips, spine) and/or appendicular (e.g., radius) exam was performed, as appropriate, using [employer], sport and exercise at [facility]. Images are obtained for bone mineral density measurement and are not obtained for diagnostic purposes. [id number] Exclusions: Lumbar spine due to advanced degenerative changes. COMPARISON:  11/17/2019. FINDINGS: Scan quality: Good. LEFT FEMORAL NECK: BMD (in g/cm2): 0.788 T-score: -1.8 Z-score: 0.3 LEFT TOTAL HIP: BMD (in g/cm2): 0.796 T-score: -1.7 Z-score: 0.2 RIGHT FEMORAL NECK: BMD (in g/cm2): 0.707 T-score: -2.4 Z-score: -0.3 RIGHT TOTAL HIP: BMD (in g/cm2):  0.756 T-score: -2.0 Z-score: -0.1 DUAL-FEMUR TOTAL MEAN: Rate of change from previous exam: -6.5 % LEFT FOREARM (RADIUS 33%): BMD (in g/cm2): 0.572 T-score: -1.9 Z-score: 0.7 Rate of change from previous exam: No significant rate of change from previous exam. FRAX 10-YEAR PROBABILITY OF FRACTURE: 10-year fracture risk is performed using the [facility] calculator based on patient-reported risk factors. Major osteoporotic fracture: 24.9% Hip fracture: 7.8% Other situations known to alter the reliability of the FRAX score should be considered when making treatment decisions, including chronic glucocorticoid use and past treatments. Further guidance on treatment can be found at the [facility]'s website [email or web address]://www.[email or web address].com/. IMPRESSION: Osteopenia based on BMD. Fracture risk is increased. Increased risk is based on low BMD, FRAX calculation. RECOMMENDATIONS: 1. All patients should optimize calcium  and vitamin D  intake. 2. Consider FDA-approved medical therapies in postmenopausal women and men aged 40 years and older, based on the following: - A hip or vertebral (clinical or morphometric) fracture - T-score less than or equal to -2.5 and secondary causes have been excluded. - Low bone mass (T-score between -1.0 and -2.5) and a 10-year probability of a hip fracture greater than or equal to 3% or a 10-year probability of a major osteoporosis-related fracture greater than or equal to 20% based on the [place] -adapted WHO algorithm. - Clinician judgment and/or patient preferences may indicate treatment for people with 10-year fracture probabilities above or below these levels 3. Patients with diagnosis of osteoporosis or at high risk for fracture should have regular bone mineral density tests. For patients eligible for Medicare, routine testing is allowed once every 2 years. The testing frequency can be increased to one year for patients who have rapidly progressing disease, those who are receiving  or discontinuing medical therapy to restore bone mass, or have additional risk factors. Electronically Signed   By: Reyes Phi M.D.   On: 10/11/2023 12:16    Alm Schneider, DO  [facility]  If 7PM-7AM, please contact night-coverage [email or web address] Password [email or web address] 11/06/2023, 4:59 PM   LOS: 11 days

## 2023-11-06 NOTE — Plan of Care (Signed)

## 2023-11-06 NOTE — Progress Notes (Signed)
 Heidi Fuentes was having pain located in her lower back while ambulating from the bathroom back to the bed. Tylenol  was given as a PRN medication and rest was suggested.

## 2023-11-07 DIAGNOSIS — G9340 Encephalopathy, unspecified: Secondary | ICD-10-CM | POA: Diagnosis not present

## 2023-11-07 DIAGNOSIS — F3131 Bipolar disorder, current episode depressed, mild: Secondary | ICD-10-CM | POA: Diagnosis not present

## 2023-11-07 DIAGNOSIS — F0390 Unspecified dementia without behavioral disturbance: Secondary | ICD-10-CM | POA: Diagnosis not present

## 2023-11-07 MED ORDER — ACETAMINOPHEN 500 MG PO TABS
1000.0000 mg | ORAL_TABLET | Freq: Three times a day (TID) | ORAL | Status: DC
  Administered 2023-11-07 – 2023-11-19 (×31): 1000 mg via ORAL
  Administered 2023-11-19: 650 mg via ORAL
  Administered 2023-11-20 – 2023-11-23 (×10): 1000 mg via ORAL
  Filled 2023-11-07 (×46): qty 2

## 2023-11-07 MED ORDER — LIDOCAINE 5 % EX PTCH
1.0000 | MEDICATED_PATCH | CUTANEOUS | Status: DC
  Administered 2023-11-07 – 2023-11-25 (×18): 1 via TRANSDERMAL
  Filled 2023-11-07 (×20): qty 1

## 2023-11-07 MED ORDER — KETOROLAC TROMETHAMINE 15 MG/ML IJ SOLN
15.0000 mg | Freq: Four times a day (QID) | INTRAMUSCULAR | Status: AC
  Administered 2023-11-07 – 2023-11-08 (×6): 15 mg via INTRAVENOUS
  Filled 2023-11-07 (×6): qty 1

## 2023-11-07 NOTE — Progress Notes (Signed)
 PROGRESS NOTE  Heidi Fuentes FMW:[id number] DOB: Jul 14, 1945 DOA: 10/25/2023 PCP: [name], DO  Brief History:  78 y.o. female with medical history significant for asthma, bipolar disorder, hypertension, chronic pain/fibromyalgia, and bronchiectasis presented with altered mental status.  At the time of evaluation by hospitalist on admission, patient was awake and alert and oriented to person but not time.  She was slightly tachycardic, tachypneic.  UA not suggestive of UTI.  Ammonia 18.  Head CT negative for acute abnormality.  Portable chest x-ray unremarkable.  MRI of brain was negative for any acute abnormality.  During early part of the hospitalization, the patient remained primarily agitated.  She refused to take her medications and would frequently spit them out.  Her oral intake was poor.  Her metabolic derangements were optimized.  Workup for reversible causes including serum B12, folic acid , TSH, and ammonia were largely unremarkable. Many of the patient's medications were discontinued including some of her psychotropic medications as the patient was not properly taking them or taking them at all at home. As time progressed, the patient was more alert and more willing to take her medications.  She was more communicative although remained pleasantly confused.  She would have episodes of argumentativeness but spoke understandably.  Social services was contacted to assist with guardianship.   Assessment/Plan: Acute metabolic or toxic encephalopathy and Delirium  -likely due to polypharmacy in setting of cognitive impairment -mental status overall improved but remains pleasantly confused and refusing to take medications -pt has underlying cognitive impairement/Major neurocognitive disorder -CT and MRI of brain negative as above.  No focal neurologic deficit.   -UA neg for UTI.   -Patient is on multiple psychotropic medications which can attribute to altered mental state but  pt not likely taking them>>d/c buproprion for now -Patient apparently stopped taking Lamictal  by herself.   -Ammonia normal.  B12 (286), folic acid  (17.6) and TSH (1.425). -IM B12 given x 2 -Fall precautions.  PT eval - with recommendation for SNF placement, TOC consulted and requested to assist with placement. -tried to restart lamictal  she was taking at home but had stopped it per reporting, will start with lower dose and see if she tolerates it - - pt refusing to take meds at times and spitting them out  - due to uncontrolled bipolar disorder and refusing to take oral meds, I requested a TTS consultation for medical management and to determine if she needs inpatient psychiatric hospitalization - pt has been medically cleared and can have TTS consultation t - [facility] consult was completed on 9/29 and they indicate that inpatient psych hospitalization not required - they ordered haloperidol >> sedated;   Bipolar disorder -- uncontrolled and she is refusing to take meds, spitting them out -- TTS consult requested for medical mgmt and to determine if she needs inpatient psychiatric hospitalization -- TTS deferred to [facility] team who saw patient 9/29, see consult note and recommendations--does not feel pt needs inpatient [facility] - restarted lamictal  at low dose - restarted fluoxetine    Hypothyroidism -Continue levothyroxine  -TSH 1.425   Hypertension -Blood pressure stable.   -Continue metoprolol -- pt recently had been refusing to take and spitting out meds for last several days -d/c losartan    GERD Continue Protonix  if pt takes it   COPD - Continue current bronchodilators   Chronic pain - Apparently she is not taking oxycodone  anymore.  -Outpatient follow-up with PCP and/or pain management   Anxiety/depression -carefully restarting  home meds--however she has refused them, spit them out   Physical deconditioning - PT eval--requested and recommendation is for SNF - TOC working on SNF  placement    Hypokalemia - Resolved     Family Communication:  no Family at bedside   Consultants:  TTS/[facility]   Code Status:  FULL   DVT Prophylaxis:  [place] Lovenox      Procedures: As Listed in Progress Note Above   Antibiotics: None          Subjective: Pt complains of pain all over.  Denies sob.  Objective: Vitals:   11/07/23 0500 11/07/23 0606 11/07/23 0700 11/07/23 0843  BP:  (!) 181/88 (!) 173/79   Pulse:  (!) 57 (!) 58   Resp:  20 20   Temp:      TempSrc:      SpO2:  98%  98%  Weight: 51.5 kg     Height:        Intake/Output Summary (Last 24 hours) at 11/07/2023 1725 Last data filed at 11/07/2023 9061 Gross per 24 hour  Intake 360 ml  Output --  Net 360 ml   Weight change: 2.7 kg Exam:  General:  Pt is alert, follows commands appropriately, not in acute distress; pleasantly confused HEENT: No icterus, No thrush, No neck mass, [place]/AT Cardiovascular: RRR, S1/S2, no rubs, no gallops Respiratory: bibasilar crackles. No wheeze Abdomen: Soft/+BS, non tender, non distended, no guarding Extremities: No edema, No lymphangitis, No petechiae, No rashes, no synovitis   Data Reviewed: I have personally reviewed following labs and imaging studies Basic Metabolic Panel: Recent Labs  Lab 11/04/23 0456  NA 142  K 3.7  CL 104  CO2 23  GLUCOSE 74  BUN 28*  CREATININE 0.65  CALCIUM  9.5  MG 2.3  PHOS 3.8   Liver Function Tests: No results for input(s): AST, ALT, ALKPHOS, BILITOT, PROT, ALBUMIN in the last 168 hours. No results for input(s): LIPASE, AMYLASE in the last 168 hours. No results for input(s): AMMONIA in the last 168 hours. Coagulation Profile: No results for input(s): INR, PROTIME in the last 168 hours. CBC: Recent Labs  Lab 11/02/23 1352 11/04/23 0456  WBC 6.8  --   HGB 14.0  --   HCT 42.5 43.2  MCV 98.6  --   PLT 233  --    Cardiac Enzymes: No results for input(s): CKTOTAL, CKMB, CKMBINDEX, TROPONINI  in the last 168 hours. BNP: Invalid input(s): POCBNP CBG: No results for input(s): GLUCAP in the last 168 hours. HbA1C: No results for input(s): HGBA1C in the last 72 hours. Urine analysis:    Component Value Date/Time   COLORURINE YELLOW 10/25/2023 1521   APPEARANCEUR CLEAR 10/25/2023 1521   APPEARANCEUR Cloudy 06/02/2012 1042   LABSPEC 1.027 10/25/2023 1521   LABSPEC 1.017 06/02/2012 1042   PHURINE 5.0 10/25/2023 1521   GLUCOSEU NEGATIVE 10/25/2023 1521   GLUCOSEU Negative 06/02/2012 1042   HGBUR NEGATIVE 10/25/2023 1521   BILIRUBINUR NEGATIVE 10/25/2023 1521   BILIRUBINUR Negative 06/02/2012 1042   KETONESUR 20 (A) 10/25/2023 1521   PROTEINUR 30 (A) 10/25/2023 1521   UROBILINOGEN 0.2 12/11/2014 1429   NITRITE NEGATIVE 10/25/2023 1521   LEUKOCYTESUR NEGATIVE 10/25/2023 1521   LEUKOCYTESUR 3+ 06/02/2012 1042   Sepsis Labs: @LABRCNTIP (procalcitonin:4,lacticidven:4) )No results found for this or any previous visit (from the past 240 hours).   Scheduled Meds:  (feeding supplement) PROSource Plus  30 mL Oral TID BM   acetaminophen   1,000 mg Oral Q8H  enoxaparin  (LOVENOX ) injection  40 mg Subcutaneous Q24H   feeding supplement  237 mL Oral BID BM   FLUoxetine   40 mg Oral Daily   fluticasone  furoate-vilanterol  1 puff Inhalation Daily   gabapentin   300 mg Oral TID   ketorolac   15 mg Intravenous Q6H   lamoTRIgine   25 mg Oral BID   latanoprost   1 drop Both Eyes QHS   levothyroxine   25 mcg Oral QAC breakfast   melatonin  3 mg Oral QHS   metoprolol  tartrate  12.5 mg Oral BID   multivitamin with minerals  1 tablet Oral Daily   pantoprazole   40 mg Oral Daily   Continuous Infusions:  Procedures/Studies: MR BRAIN WO CONTRAST Result Date: 10/25/2023 CLINICAL DATA:  Provided history: Mental status change, unknown cause. EXAM: MRI HEAD WITHOUT CONTRAST TECHNIQUE: Multiplanar, multiecho pulse sequences of the brain and surrounding structures were obtained without  intravenous contrast. COMPARISON:  Head CT 10/25/2023.  Brain MRI 07/07/2006. FINDINGS: Intermittently motion degraded examination. Most notably, the sagittal T1 sequence is severely motion degraded, the axial T2 sequence is moderate-to-severely motion degraded, the axial FLAIR sequence is severely motion degraded and the axial T1 sequence is moderately motion degraded. Within this limitation, findings are as follows. Brain: Generalized cerebral atrophy. Multifocal T2 FLAIR hyperintense signal abnormality within the cerebral white matter, nonspecific but compatible with moderate chronic small vessel ischemic disease. No evidence of acute infarct. No intracranial mass or extra-axial fluid collection is identified. No chronic intracranial blood products. No midline shift. Vascular: Maintained flow voids within the proximal large arterial vessels. Skull and upper cervical spine: No focal worrisome marrow lesion. Multilevel facet arthropathy within the cervical spine at the visible levels. Sinuses/Orbits: No mass or acute finding within the imaged orbits. Redemonstrated chronic, depressed fracture deformity of the left orbital floor. No significant paranasal sinus disease. IMPRESSION: 1. Significantly motion degraded examination. Within this limitation, findings are as follows. 2. No acute intracranial abnormality is identified. The diffusion-weighted imaging is of good quality and there is no evidence of an acute infarct. 3. Moderate chronic small vessel ischemic changes within the cerebral white matter. 4. Generalized cerebral atrophy. Electronically Signed   By: Rockey Childs D.O.   On: 10/25/2023 20:13   CT HEAD WO CONTRAST Result Date: 10/25/2023 CLINICAL DATA:  Altered mental status.  Chronic UTIs. EXAM: CT HEAD WITHOUT CONTRAST TECHNIQUE: Contiguous axial images were obtained from the base of the skull through the vertex without intravenous contrast. RADIATION DOSE REDUCTION: This exam was performed according  to the departmental dose-optimization program which includes automated exposure control, adjustment of the mA and/or kV according to patient size and/or use of iterative reconstruction technique. COMPARISON:  03/07/2023 FINDINGS: Brain: Ventricles, cisterns and other CSF spaces are normal. Mild chronic ischemic microvascular disease is present. No mass, mass effect, shift of midline structures or acute hemorrhage. No acute infarction. Vascular: No hyperdense vessel or unexpected calcification. Skull: Normal. Negative for fracture or focal lesion. Sinuses/Orbits: No acute finding. Other: None. IMPRESSION: 1. No acute findings. 2. Mild chronic ischemic microvascular disease. Electronically Signed   By: Toribio Agreste M.D.   On: 10/25/2023 16:29   DG Chest Port 1 View Result Date: 10/25/2023 CLINICAL DATA:  Altered mental status, back pain. EXAM: PORTABLE CHEST 1 VIEW COMPARISON:  03/06/2023. FINDINGS: Trachea is midline. Heart size within normal limits. Thoracic aorta is calcified. Lungs are clear. No pleural fluid. IMPRESSION: No acute findings. Electronically Signed   By: Newell Eke M.D.   On: 10/25/2023  16:10   DG BONE DENSITY (DXA) Result Date: 10/11/2023 EXAM: DUAL X-RAY ABSORPTIOMETRY (DXA) FOR BONE MINERAL DENSITY 10/11/2023 8:58 am CLINICAL DATA:  78 year old Female Postmenopausal. TECHNIQUE: An axial (e.g., hips, spine) and/or appendicular (e.g., radius) exam was performed, as appropriate, using [employer] Psychologist, sport and exercise at [facility]. Images are obtained for bone mineral density measurement and are not obtained for diagnostic purposes. [id number] Exclusions: Lumbar spine due to advanced degenerative changes. COMPARISON:  11/17/2019. FINDINGS: Scan quality: Good. LEFT FEMORAL NECK: BMD (in g/cm2): 0.788 T-score: -1.8 Z-score: 0.3 LEFT TOTAL HIP: BMD (in g/cm2): 0.796 T-score: -1.7 Z-score: 0.2 RIGHT FEMORAL NECK: BMD (in g/cm2): 0.707 T-score: -2.4 Z-score: -0.3 RIGHT TOTAL HIP: BMD (in  g/cm2): 0.756 T-score: -2.0 Z-score: -0.1 DUAL-FEMUR TOTAL MEAN: Rate of change from previous exam: -6.5 % LEFT FOREARM (RADIUS 33%): BMD (in g/cm2): 0.572 T-score: -1.9 Z-score: 0.7 Rate of change from previous exam: No significant rate of change from previous exam. FRAX 10-YEAR PROBABILITY OF FRACTURE: 10-year fracture risk is performed using the [facility] calculator based on patient-reported risk factors. Major osteoporotic fracture: 24.9% Hip fracture: 7.8% Other situations known to alter the reliability of the FRAX score should be considered when making treatment decisions, including chronic glucocorticoid use and past treatments. Further guidance on treatment can be found at the [facility]'s website [email or web address]://www.[email or web address]/. IMPRESSION: Osteopenia based on BMD. Fracture risk is increased. Increased risk is based on low BMD, FRAX calculation. RECOMMENDATIONS: 1. All patients should optimize calcium  and vitamin D  intake. 2. Consider FDA-approved medical therapies in postmenopausal women and men aged 85 years and older, based on the following: - A hip or vertebral (clinical or morphometric) fracture - T-score less than or equal to -2.5 and secondary causes have been excluded. - Low bone mass (T-score between -1.0 and -2.5) and a 10-year probability of a hip fracture greater than or equal to 3% or a 10-year probability of a major osteoporosis-related fracture greater than or equal to 20% based on the [place] -adapted WHO algorithm. - Clinician judgment and/or patient preferences may indicate treatment for people with 10-year fracture probabilities above or below these levels 3. Patients with diagnosis of osteoporosis or at high risk for fracture should have regular bone mineral density tests. For patients eligible for Medicare, routine testing is allowed once every 2 years. The testing frequency can be increased to one year for patients who have rapidly progressing disease, those who are  receiving or discontinuing medical therapy to restore bone mass, or have additional risk factors. Electronically Signed   By: Reyes Phi M.D.   On: 10/11/2023 12:16    Alm Schneider, DO  [facility]  If 7PM-7AM, please contact night-coverage [email or web address] Password [email or web address] 11/07/2023, 5:25 PM   LOS: 12 days

## 2023-11-07 NOTE — Plan of Care (Signed)
  Problem: Education: Goal: Knowledge of General Education information will improve Description: Including pain rating scale, medication(s)/side effects and non-pharmacologic comfort measures Outcome: Progressing   Problem: Health Behavior/Discharge Planning: Goal: Ability to manage health-related needs will improve Outcome: Progressing   Problem: Clinical Measurements: Goal: Will remain free from infection Outcome: Progressing Goal: Diagnostic test results will improve Outcome: Progressing   Problem: Activity: Goal: Risk for activity intolerance will decrease Outcome: Progressing   Problem: Coping: Goal: Level of anxiety will decrease Outcome: Progressing   Problem: Safety: Goal: Ability to remain free from injury will improve Outcome: Progressing   Problem: Skin Integrity: Goal: Risk for impaired skin integrity will decrease Outcome: Progressing

## 2023-11-07 NOTE — Progress Notes (Signed)
 Patient has not slept all night and is constantly rambling and repeating words and rhyming.  Patient became agitated around 3am, however was redirectable to cooperate with safety precautions.

## 2023-11-08 DIAGNOSIS — G9340 Encephalopathy, unspecified: Secondary | ICD-10-CM | POA: Diagnosis not present

## 2023-11-08 DIAGNOSIS — E039 Hypothyroidism, unspecified: Secondary | ICD-10-CM | POA: Diagnosis not present

## 2023-11-08 DIAGNOSIS — F3131 Bipolar disorder, current episode depressed, mild: Secondary | ICD-10-CM | POA: Diagnosis not present

## 2023-11-08 DIAGNOSIS — F0390 Unspecified dementia without behavioral disturbance: Secondary | ICD-10-CM | POA: Diagnosis not present

## 2023-11-08 NOTE — Progress Notes (Signed)
 Mobility Specialist Progress Note:    11/08/23 0915  Mobility  Activity Ambulated with assistance  Level of Assistance Contact guard assist, steadying assist  Assistive Device Other (Comment) (1 HHA)  Distance Ambulated (ft) 40 ft  Range of Motion/Exercises Active;All extremities  Activity Response Tolerated well  Mobility Referral Yes  Mobility visit 1 Mobility  Mobility Specialist Start Time (ACUTE ONLY) 0915  Mobility Specialist Stop Time (ACUTE ONLY) 0935  Mobility Specialist Time Calculation (min) (ACUTE ONLY) 20 min   Pt received standing at sink with NT. Required CGA to ambulate with 1 hand-held assist. Tolerated well, confused throughout session. Left in chair, alarm on. All needs met.  Zaylan Kissoon Mobility Specialist Please contact via Special educational needs teacher or  Rehab office at [phone]

## 2023-11-08 NOTE — Progress Notes (Signed)
 PROGRESS NOTE  Heidi Fuentes FMW:[id number] DOB: 11-29-1945 DOA: 10/25/2023 PCP: [name], DO  Brief History:  78 y.o. female with medical history significant for asthma, bipolar disorder, hypertension, chronic pain/fibromyalgia, and bronchiectasis presented with altered mental status.  At the time of evaluation by hospitalist on admission, patient was awake and alert and oriented to person but not time.  She was slightly tachycardic, tachypneic.  UA not suggestive of UTI.  Ammonia 18.  Head CT negative for acute abnormality.  Portable chest x-ray unremarkable.  MRI of brain was negative for any acute abnormality.  During early part of the hospitalization, the patient remained primarily agitated.  She refused to take her medications and would frequently spit them out.  Her oral intake was poor.  Her metabolic derangements were optimized.  Workup for reversible causes including serum B12, folic acid , TSH, and ammonia were largely unremarkable. Many of the patient's medications were discontinued including some of her psychotropic medications as the patient was not properly taking them or taking them at all at home. As time progressed, the patient was more alert and more willing to take her medications.  She was more communicative although remained pleasantly confused.  She would have episodes of argumentativeness but spoke understandably.  Social services was contacted to assist with guardianship.   Assessment/Plan: Acute metabolic or toxic encephalopathy and Delirium  -likely due to polypharmacy in setting of cognitive impairment -mental status overall improved but remains pleasantly confused and refusing to take medications -pt has underlying cognitive impairement/Major neurocognitive disorder -CT and MRI of brain negative as above.  No focal neurologic deficit.   -UA neg for UTI.   -Patient is on multiple psychotropic medications which can attribute to altered mental state but  pt not likely taking them>>d/c buproprion for now -Patient apparently stopped taking Lamictal  by herself.   -Ammonia normal.  B12 (286), folic acid  (17.6) and TSH (1.425). -IM B12 given x 2 -Fall precautions.  PT eval - with recommendation for SNF placement, TOC consulted and requested to assist with placement. -tried to restart lamictal  she was taking at home but had stopped it per reporting, will start with lower dose and see if she tolerates it - - pt refusing to take meds at times and spitting them out  - due to uncontrolled bipolar disorder and refusing to take oral meds, I requested a TTS consultation for medical management and to determine if she needs inpatient psychiatric hospitalization - pt has been medically cleared and can have TTS consultation t - [facility] consult was completed on 9/29 and they indicate that inpatient psych hospitalization not required - they ordered haloperidol >> sedated;   Bipolar disorder -- uncontrolled and she is refusing to take meds, spitting them out -- TTS consult requested for medical mgmt and to determine if she needs inpatient psychiatric hospitalization -- TTS deferred to [facility] team who saw patient 9/29, see consult note and recommendations--does not feel pt needs inpatient [facility] - restarted lamictal  at low dose - restarted fluoxetine    Hypothyroidism -Continue levothyroxine  -TSH 1.425   Hypertension -Blood pressure stable.   -Continue metoprolol -- pt recently had been refusing to take and spitting out meds for last several days -d/c losartan    GERD Continue Protonix  if pt takes it   COPD - Continue current bronchodilators   Chronic pain - Apparently she is not taking oxycodone  anymore.  -Outpatient follow-up with PCP and/or pain management   Anxiety/depression -carefully restarting  home meds--however she has refused them, spit them out   Physical deconditioning - PT eval--requested and recommendation is for SNF - TOC working on SNF  placement    Hypokalemia - Resolved     Family Communication:  no Family at bedside   Consultants:  TTS/[facility]   Code Status:  FULL   DVT Prophylaxis:  [place] Lovenox      Procedures: As Listed in Progress Note Above   Antibiotics: None       Subjective: Pt denies cp, sob, n/v/d, abd pain  Objective: Vitals:   11/08/23 0500 11/08/23 0607 11/08/23 1000 11/08/23 1349  BP:  (!) 188/86 (!) 150/85 (!) 152/69  Pulse:  (!) 54 (!) 109 63  Resp:  18    Temp:  97.9 F (36.6 C)  97.8 F (36.6 C)  TempSrc:      SpO2:  98% 98% 99%  Weight: 51 kg     Height:        Intake/Output Summary (Last 24 hours) at 11/08/2023 1801 Last data filed at 11/08/2023 1300 Gross per 24 hour  Intake 800 ml  Output --  Net 800 ml   Weight change: -0.516 kg Exam:  General:  Pt is alert, follows commands appropriately, not in acute distress HEENT: No icterus, No thrush, No neck mass, [place]/AT Cardiovascular: RRR, S1/S2, no rubs, no gallops Respiratory: CTA bilaterally, no wheezing, no crackles, no rhonchi Abdomen: Soft/+BS, non tender, non distended, no guarding Extremities: No edema, No lymphangitis, No petechiae, No rashes, no synovitis   Data Reviewed: I have personally reviewed following labs and imaging studies Basic Metabolic Panel: Recent Labs  Lab 11/04/23 0456  NA 142  K 3.7  CL 104  CO2 23  GLUCOSE 74  BUN 28*  CREATININE 0.65  CALCIUM  9.5  MG 2.3  PHOS 3.8   Liver Function Tests: No results for input(s): AST, ALT, ALKPHOS, BILITOT, PROT, ALBUMIN in the last 168 hours. No results for input(s): LIPASE, AMYLASE in the last 168 hours. No results for input(s): AMMONIA in the last 168 hours. Coagulation Profile: No results for input(s): INR, PROTIME in the last 168 hours. CBC: Recent Labs  Lab 11/02/23 1352 11/04/23 0456  WBC 6.8  --   HGB 14.0  --   HCT 42.5 43.2  MCV 98.6  --   PLT 233  --    Cardiac Enzymes: No results for input(s):  CKTOTAL, CKMB, CKMBINDEX, TROPONINI in the last 168 hours. BNP: Invalid input(s): POCBNP CBG: No results for input(s): GLUCAP in the last 168 hours. HbA1C: No results for input(s): HGBA1C in the last 72 hours. Urine analysis:    Component Value Date/Time   COLORURINE YELLOW 10/25/2023 1521   APPEARANCEUR CLEAR 10/25/2023 1521   APPEARANCEUR Cloudy 06/02/2012 1042   LABSPEC 1.027 10/25/2023 1521   LABSPEC 1.017 06/02/2012 1042   PHURINE 5.0 10/25/2023 1521   GLUCOSEU NEGATIVE 10/25/2023 1521   GLUCOSEU Negative 06/02/2012 1042   HGBUR NEGATIVE 10/25/2023 1521   BILIRUBINUR NEGATIVE 10/25/2023 1521   BILIRUBINUR Negative 06/02/2012 1042   KETONESUR 20 (A) 10/25/2023 1521   PROTEINUR 30 (A) 10/25/2023 1521   UROBILINOGEN 0.2 12/11/2014 1429   NITRITE NEGATIVE 10/25/2023 1521   LEUKOCYTESUR NEGATIVE 10/25/2023 1521   LEUKOCYTESUR 3+ 06/02/2012 1042   Sepsis Labs: @LABRCNTIP (procalcitonin:4,lacticidven:4) )No results found for this or any previous visit (from the past 240 hours).   Scheduled Meds:  (feeding supplement) PROSource Plus  30 mL Oral TID BM   acetaminophen   1,000 mg  Oral Q8H   enoxaparin  (LOVENOX ) injection  40 mg Subcutaneous Q24H   feeding supplement  237 mL Oral BID BM   FLUoxetine   40 mg Oral Daily   fluticasone  furoate-vilanterol  1 puff Inhalation Daily   gabapentin   300 mg Oral TID   ketorolac   15 mg Intravenous Q6H   lamoTRIgine   25 mg Oral BID   latanoprost   1 drop Both Eyes QHS   levothyroxine   25 mcg Oral QAC breakfast   lidocaine   1 patch Transdermal Q24H   melatonin  3 mg Oral QHS   metoprolol  tartrate  12.5 mg Oral BID   multivitamin with minerals  1 tablet Oral Daily   pantoprazole   40 mg Oral Daily   Continuous Infusions:  Procedures/Studies: MR BRAIN WO CONTRAST Result Date: 10/25/2023 CLINICAL DATA:  Provided history: Mental status change, unknown cause. EXAM: MRI HEAD WITHOUT CONTRAST TECHNIQUE: Multiplanar, multiecho pulse  sequences of the brain and surrounding structures were obtained without intravenous contrast. COMPARISON:  Head CT 10/25/2023.  Brain MRI 07/07/2006. FINDINGS: Intermittently motion degraded examination. Most notably, the sagittal T1 sequence is severely motion degraded, the axial T2 sequence is moderate-to-severely motion degraded, the axial FLAIR sequence is severely motion degraded and the axial T1 sequence is moderately motion degraded. Within this limitation, findings are as follows. Brain: Generalized cerebral atrophy. Multifocal T2 FLAIR hyperintense signal abnormality within the cerebral white matter, nonspecific but compatible with moderate chronic small vessel ischemic disease. No evidence of acute infarct. No intracranial mass or extra-axial fluid collection is identified. No chronic intracranial blood products. No midline shift. Vascular: Maintained flow voids within the proximal large arterial vessels. Skull and upper cervical spine: No focal worrisome marrow lesion. Multilevel facet arthropathy within the cervical spine at the visible levels. Sinuses/Orbits: No mass or acute finding within the imaged orbits. Redemonstrated chronic, depressed fracture deformity of the left orbital floor. No significant paranasal sinus disease. IMPRESSION: 1. Significantly motion degraded examination. Within this limitation, findings are as follows. 2. No acute intracranial abnormality is identified. The diffusion-weighted imaging is of good quality and there is no evidence of an acute infarct. 3. Moderate chronic small vessel ischemic changes within the cerebral white matter. 4. Generalized cerebral atrophy. Electronically Signed   By: Rockey Childs D.O.   On: 10/25/2023 20:13   CT HEAD WO CONTRAST Result Date: 10/25/2023 CLINICAL DATA:  Altered mental status.  Chronic UTIs. EXAM: CT HEAD WITHOUT CONTRAST TECHNIQUE: Contiguous axial images were obtained from the base of the skull through the vertex without intravenous  contrast. RADIATION DOSE REDUCTION: This exam was performed according to the departmental dose-optimization program which includes automated exposure control, adjustment of the mA and/or kV according to patient size and/or use of iterative reconstruction technique. COMPARISON:  03/07/2023 FINDINGS: Brain: Ventricles, cisterns and other CSF spaces are normal. Mild chronic ischemic microvascular disease is present. No mass, mass effect, shift of midline structures or acute hemorrhage. No acute infarction. Vascular: No hyperdense vessel or unexpected calcification. Skull: Normal. Negative for fracture or focal lesion. Sinuses/Orbits: No acute finding. Other: None. IMPRESSION: 1. No acute findings. 2. Mild chronic ischemic microvascular disease. Electronically Signed   By: Toribio Agreste M.D.   On: 10/25/2023 16:29   DG Chest Port 1 View Result Date: 10/25/2023 CLINICAL DATA:  Altered mental status, back pain. EXAM: PORTABLE CHEST 1 VIEW COMPARISON:  03/06/2023. FINDINGS: Trachea is midline. Heart size within normal limits. Thoracic aorta is calcified. Lungs are clear. No pleural fluid. IMPRESSION: No acute findings. Electronically  Signed   By: Newell Eke M.D.   On: 10/25/2023 16:10   DG BONE DENSITY (DXA) Result Date: 10/11/2023 EXAM: DUAL X-RAY ABSORPTIOMETRY (DXA) FOR BONE MINERAL DENSITY 10/11/2023 8:58 am CLINICAL DATA:  78 year old Female Postmenopausal. TECHNIQUE: An axial (e.g., hips, spine) and/or appendicular (e.g., radius) exam was performed, as appropriate, using GE Psychologist, sport and exercise at [facility]. Images are obtained for bone mineral density measurement and are not obtained for diagnostic purposes. [id number] Exclusions: Lumbar spine due to advanced degenerative changes. COMPARISON:  11/17/2019. FINDINGS: Scan quality: Good. LEFT FEMORAL NECK: BMD (in g/cm2): 0.788 T-score: -1.8 Z-score: 0.3 LEFT TOTAL HIP: BMD (in g/cm2): 0.796 T-score: -1.7 Z-score: 0.2 RIGHT FEMORAL NECK: BMD (in  g/cm2): 0.707 T-score: -2.4 Z-score: -0.3 RIGHT TOTAL HIP: BMD (in g/cm2): 0.756 T-score: -2.0 Z-score: -0.1 DUAL-FEMUR TOTAL MEAN: Rate of change from previous exam: -6.5 % LEFT FOREARM (RADIUS 33%): BMD (in g/cm2): 0.572 T-score: -1.9 Z-score: 0.7 Rate of change from previous exam: No significant rate of change from previous exam. FRAX 10-YEAR PROBABILITY OF FRACTURE: 10-year fracture risk is performed using the [facility] calculator based on patient-reported risk factors. Major osteoporotic fracture: 24.9% Hip fracture: 7.8% Other situations known to alter the reliability of the FRAX score should be considered when making treatment decisions, including chronic glucocorticoid use and past treatments. Further guidance on treatment can be found at the [facility]'s website [email or web address]://www.patton.com/. IMPRESSION: Osteopenia based on BMD. Fracture risk is increased. Increased risk is based on low BMD, FRAX calculation. RECOMMENDATIONS: 1. All patients should optimize calcium  and vitamin D  intake. 2. Consider FDA-approved medical therapies in postmenopausal women and men aged 72 years and older, based on the following: - A hip or vertebral (clinical or morphometric) fracture - T-score less than or equal to -2.5 and secondary causes have been excluded. - Low bone mass (T-score between -1.0 and -2.5) and a 10-year probability of a hip fracture greater than or equal to 3% or a 10-year probability of a major osteoporosis-related fracture greater than or equal to 20% based on the [place] -adapted WHO algorithm. - Clinician judgment and/or patient preferences may indicate treatment for people with 10-year fracture probabilities above or below these levels 3. Patients with diagnosis of osteoporosis or at high risk for fracture should have regular bone mineral density tests. For patients eligible for Medicare, routine testing is allowed once every 2 years. The testing frequency can be increased to one year  for patients who have rapidly progressing disease, those who are receiving or discontinuing medical therapy to restore bone mass, or have additional risk factors. Electronically Signed   By: Reyes Phi M.D.   On: 10/11/2023 12:16    Alm Schneider, DO  [facility]  If 7PM-7AM, please contact night-coverage [email or web address] Password [email or web address] 11/08/2023, 6:01 PM   LOS: 13 days

## 2023-11-08 NOTE — TOC Progression Note (Signed)
 Transition of Care [facility]) - Progression Note    Patient Details  Name: Heidi Fuentes MRN: [id number] Date of Birth: 21-Sep-1945  Transition of Care [facility]) CM/SW Contact  Lucie Lunger, [place] Phone Number: 11/08/2023, 10:11 AM  Clinical Narrative:    CSW reached out to [facility]PS SW [place] requesting update on guardianship. CSW left secure VM requesting call back with update. TOC to follow.   Expected Discharge Plan: Skilled Nursing Facility Barriers to Discharge: Unsafe home situation, DSS Guardianship  DSS Guardianship request start date: 10/27/23 DSS Service county:  [place], transferred to [place] due to conflict of interest) DSS Date of decision notification: 10/28/23 DSS Guardianship decision: Substantiated      Expected Discharge Plan and Services In-house Referral: Clinical Social Work Discharge Planning Services: NA Post Acute Care Choice: Nursing Home, Skilled Nursing Facility Living arrangements for the past 2 months: Single Family Home                                       Social Drivers of Health (SDOH) Interventions SDOH Screenings   Food Insecurity: No Food Insecurity (10/25/2023)  Housing: Low Risk  (10/25/2023)  Transportation Needs: No Transportation Needs (10/25/2023)  Utilities: Not At Risk (10/25/2023)  Social Connections: Unknown (10/25/2023)  Tobacco Use: Low Risk  (10/25/2023)    Readmission Risk Interventions    11/03/2023   12:54 PM 10/29/2023   10:31 AM 10/28/2023   10:36 AM  Readmission Risk Prevention Plan  Transportation Screening Complete Complete Complete  PCP or Specialist Appt within 5-7 Days   Complete  Home Care Screening  Complete Complete  Medication Review (RN CM)  Complete Complete  HRI or Home Care Consult Complete    Social Work Consult for Recovery Care Planning/Counseling Complete    Palliative Care Screening Not Applicable    Medication Review Oceanographer) Complete

## 2023-11-08 NOTE — Plan of Care (Signed)
   Problem: Clinical Measurements: Goal: Ability to maintain clinical measurements within normal limits will improve Outcome: Progressing

## 2023-11-08 NOTE — Progress Notes (Signed)
 Mobility Specialist Progress Note:    11/08/23 1315  Mobility  Activity Ambulated with assistance  Level of Assistance Contact guard assist, steadying assist  Assistive Device None  Distance Ambulated (ft) 8 ft  Range of Motion/Exercises Active;All extremities  Activity Response Tolerated well  Mobility Referral Yes  Mobility visit 1 Mobility  Mobility Specialist Start Time (ACUTE ONLY) 1315  Mobility Specialist Stop Time (ACUTE ONLY) 1330  Mobility Specialist Time Calculation (min) (ACUTE ONLY) 15 min   Pt received sitting EOB, requesting assistance to bathroom. Required CGA with no AD to stand and ambulate. Tolerated well, left in bathroom. NT notified, all needs met.  Sondi Desch Mobility Specialist Please contact via Special educational needs teacher or  Rehab office at [phone]

## 2023-11-08 NOTE — Progress Notes (Signed)
 Mobility Specialist Progress Note:    11/08/23 1130  Mobility  Activity Ambulated with assistance  Level of Assistance Contact guard assist, steadying assist  Assistive Device Other (Comment) (1 HHA)  Distance Ambulated (ft) 40 ft  Range of Motion/Exercises Active;All extremities  Activity Response Tolerated well  Mobility Referral Yes  Mobility visit 1 Mobility  Mobility Specialist Start Time (ACUTE ONLY) 1130  Mobility Specialist Stop Time (ACUTE ONLY) 1150  Mobility Specialist Time Calculation (min) (ACUTE ONLY) 20 min   Pt received standing EOB, agreeable to mobility. Required CGA to ambulate with 1 hand-held assist. Tolerated well, returned supine. Alarm on, all needs met.  Kairyn Olmeda Mobility Specialist Please contact via Special educational needs teacher or  Rehab office at [phone]

## 2023-11-09 DIAGNOSIS — G9340 Encephalopathy, unspecified: Secondary | ICD-10-CM | POA: Diagnosis not present

## 2023-11-09 MED ORDER — LOSARTAN POTASSIUM 25 MG PO TABS
25.0000 mg | ORAL_TABLET | Freq: Every day | ORAL | Status: DC
  Administered 2023-11-09 – 2023-11-23 (×15): 25 mg via ORAL
  Filled 2023-11-09 (×16): qty 1

## 2023-11-09 MED ORDER — IPRATROPIUM-ALBUTEROL 0.5-2.5 (3) MG/3ML IN SOLN
3.0000 mL | RESPIRATORY_TRACT | Status: DC | PRN
  Administered 2023-11-25: 3 mL via RESPIRATORY_TRACT
  Filled 2023-11-09: qty 3

## 2023-11-09 MED ORDER — ACETAMINOPHEN 325 MG PO TABS: 650.0000 mg | ORAL_TABLET | Freq: Four times a day (QID) | ORAL | Status: DC | PRN

## 2023-11-09 MED ORDER — GLUCAGON HCL RDNA (DIAGNOSTIC) 1 MG IJ SOLR: 1.0000 mg | INTRAMUSCULAR | Status: DC | PRN

## 2023-11-09 MED ORDER — HYDRALAZINE HCL 20 MG/ML IJ SOLN: 10.0000 mg | INTRAMUSCULAR | Status: DC | PRN

## 2023-11-09 MED ORDER — METOPROLOL TARTRATE 5 MG/5ML IV SOLN: 5.0000 mg | INTRAVENOUS | Status: DC | PRN

## 2023-11-09 MED ORDER — GUAIFENESIN 100 MG/5ML PO LIQD: 5.0000 mL | ORAL | Status: DC | PRN

## 2023-11-09 NOTE — TOC Progression Note (Signed)
 Transition of Care [facility]) - Progression Note    Patient Details  Name: Heidi Fuentes MRN: [id number] Date of Birth: 17-Mar-1945  Transition of Care [facility]) CM/SW Contact  Lucie Lunger, [place] Phone Number: 11/09/2023, 10:51 AM  Clinical Narrative:    CSW reached out again to [facility] with  APS as no updates have been provided at this time. CSW sent secure email to [facility] requesting updates be sent. Once CSW has updates treatment team will be updated. TOC to follow.   Expected Discharge Plan: Skilled Nursing Facility Barriers to Discharge: Unsafe home situation, DSS Guardianship  DSS Guardianship request start date: 10/27/23 DSS Service county:  [place], transferred to [place] due to conflict of interest) DSS Date of decision notification: 10/28/23 DSS Guardianship decision: Substantiated      Expected Discharge Plan and Services In-house Referral: Clinical Social Work Discharge Planning Services: NA Post Acute Care Choice: Nursing Home, Skilled Nursing Facility Living arrangements for the past 2 months: Single Family Home                                       Social Drivers of Health (SDOH) Interventions SDOH Screenings   Food Insecurity: No Food Insecurity (10/25/2023)  Housing: Low Risk  (10/25/2023)  Transportation Needs: No Transportation Needs (10/25/2023)  Utilities: Not At Risk (10/25/2023)  Social Connections: Unknown (10/25/2023)  Tobacco Use: Low Risk  (10/25/2023)    Readmission Risk Interventions    11/03/2023   12:54 PM 10/29/2023   10:31 AM 10/28/2023   10:36 AM  Readmission Risk Prevention Plan  Transportation Screening Complete Complete Complete  PCP or Specialist Appt within 5-7 Days   Complete  Home Care Screening  Complete Complete  Medication Review (RN CM)  Complete Complete  HRI or Home Care Consult Complete    Social Work Consult for Recovery Care Planning/Counseling Complete    Palliative Care Screening Not Applicable    Medication  Review Oceanographer) Complete

## 2023-11-09 NOTE — Progress Notes (Signed)
 Mobility Specialist Progress Note:    11/09/23 1220  Mobility  Activity Ambulated with assistance  Level of Assistance Contact guard assist, steadying assist  Assistive Device None  Distance Ambulated (ft) 10 ft  Range of Motion/Exercises Active;All extremities  Activity Response Tolerated well  Mobility Referral Yes  Mobility visit 1 Mobility  Mobility Specialist Start Time (ACUTE ONLY) 1220  Mobility Specialist Stop Time (ACUTE ONLY) 1235  Mobility Specialist Time Calculation (min) (ACUTE ONLY) 15 min   Pt received in bathroom, bed alarm going off. Required CGA to stand and ambulate with no AD. Tolerated well, left supine. Alarm on, all needs met.  Ruey Storer Mobility Specialist Please contact via Special educational needs teacher or  Rehab office at [phone]

## 2023-11-09 NOTE — Progress Notes (Signed)
 Mobility Specialist Progress Note:    11/09/23 1050  Mobility  Activity Ambulated with assistance  Level of Assistance Contact guard assist, steadying assist  Assistive Device None  Distance Ambulated (ft) 15 ft  Range of Motion/Exercises Active;All extremities  Activity Response Tolerated well  Mobility Referral Yes  Mobility visit 1 Mobility  Mobility Specialist Start Time (ACUTE ONLY) 1050  Mobility Specialist Stop Time (ACUTE ONLY) 1110  Mobility Specialist Time Calculation (min) (ACUTE ONLY) 20 min   Pt received in bed, requesting assistance to bathroom. Required CGA to stand and ambulate with no AD. Tolerated well, pt confused. Returned supine, alarm on. All needs met.  Daysi Boggan Mobility Specialist Please contact via Special educational needs teacher or  Rehab office at [phone]

## 2023-11-09 NOTE — Progress Notes (Signed)
 Physical Therapy Treatment Patient Details Name: Heidi Fuentes MRN: [id number] DOB: 01-31-1946 Today's Date: 11/09/2023   History of Present Illness Heidi Fuentes is a 78 y.o. female with medical history significant for asthma, bipolar disorder, hypertension, chronic pain, and bronchiectasis.  Patient was brought to the ED via EMS for reports of altered mental status.  Patient is alone but has a caregiver, she called patient's son the patient was not her normal self, and he said to bring patient to the ED.  The time of my evaluation, patient is awake and alert and oriented to person, but not time, she is unable to tell me why she is here. She appears to answer other questions appropriately.  She denies difficulty breathing, no cough, no vomiting or diarrhea, no weakness of her extremities, endorses good oral intake.     She was hospitalized back in February- 2/1 to 2/6 also for altered mental status thought secondary to COVID infection and  hypoxia.    PT Comments  Pt has met all goals and no longer in need of skilled Physical therapy.     If plan is discharge home, recommend the following: A little help with walking and/or transfers;Assistance with cooking/housework;A lot of help with bathing/dressing/bathroom;Direct supervision/assist for medications management;Direct supervision/assist for financial management;Assist for transportation;Help with stairs or ramp for entrance;Supervision due to cognitive status   Can travel by private vehicle      yes  Equipment Recommendations  None recommended by PT    Recommendations for Other Services  none     Precautions / Restrictions       Mobility  Bed Mobility Overal bed mobility: Modified Independent             General bed mobility comments: PT needs no assist to get in or out of bed but does take increased time    Transfers Overall transfer level: Modified independent Equipment used: None Transfers: Sit to/from Stand Sit to  Stand: Modified independent (Device/Increase time)   Step pivot transfers: Modified independent (Device/Increase time)       General transfer comment: PT refuses walker    Ambulation/Gait Ambulation/Gait assistance: Contact guard assist Gait Distance (Feet): 65 Feet Assistive device: 1 person hand held assist Gait Pattern/deviations: Decreased step length - right, Decreased step length - left, Decreased stride length Gait velocity: decreased               Communication    Cognition Arousal: Alert Behavior During Therapy: Anxious, Impulsive   PT - Cognitive impairments: Initiation, Attention, No family/caregiver present to determine baseline                                Cueing  PT will listen to cuing but will do what pt wants.  IE therapist wanted pt to use a walker pt refused.  Able to walk with one hand assist with no loss of balance but fatigues easily.  Unfortunately, this is not skilled nursing or mobility tech should continue to walk pt on a regular basis while pt is in the hospital.   Exercises  Pt refused.         Pertinent Vitals/Pain Pain Assessment Faces Pain Scale: Hurts a little bit (PT repeats self multiple times, can not carry on a conversation but does get concerns across.  PT states pain patch and rubs her back but does not appear to be in any distress.) Breathing: normal  Home Living  PT at home with a care giver.                         Prior Function     Pt states she walks without an assistive device, therapist is unsure if this is true or not.        PT Goals (current goals can now be found in the care plan section)   Have all been met at this time.  Will discharge to mobility specialist.    Frequency           PT Plan  Discharge.        AM-PAC PT 6 Clicks Mobility   Outcome Measure  Help needed turning from your back to your side while in a flat bed without using bedrails?: None Help needed moving  from lying on your back to sitting on the side of a flat bed without using bedrails?: None Help needed moving to and from a bed to a chair (including a wheelchair)?: A Little Help needed standing up from a chair using your arms (e.g., wheelchair or bedside chair)?: A Little Help needed to walk in hospital room?: A Little Help needed climbing 3-5 steps with a railing? : A Little 6 Click Score: 20    End of Session Equipment Utilized During Treatment:  (Pt refuses gait belt.) Activity Tolerance: Patient tolerated treatment well Patient left:  (PT refused to go into chair.  Bed placed in chair position for pt to eat breakfast.) Nurse Communication: Mobility status PT Visit Diagnosis: Unsteadiness on feet (R26.81)     Time: 9154-9094 PT Time Calculation (min) (ACUTE ONLY): 20 min  Charges:    $Gait Training: 8-22 mins PT General Charges $$ ACUTE PT VISIT: 1 Visit                    Montie Metro, PT CLT [phone]  11/09/2023, 9:10 AM

## 2023-11-09 NOTE — Progress Notes (Signed)
 2200: Patient speaking in clang association and Engineer, mining of ideas. Patient refused medication but allowed this RN to give her lovenox , eye drops, and apply lidocaine  patch. She refused her PO medications stating I was trying to poison her. Patient then started cussing at this RN but was redirectable.  2330: This RN entered the room to assess pain level. Patient stated she was in pain but started cussing at this RN and stated she didn't want anything from me. Patient redirected and toradol  marked not given due to patient refusal.  0200: Patient became aggressive and was not able to be redirected. Patient took off her clothes and was cussing at this RN. Patient had continued the clanging and flight of ideas. She started yelling at this RN to leave the room and became hostile. Patient continued to yell die,die,die. Charge RN made aware. Patient became hostile and aggressive with another LPN. PRN haldol  1 mg IV was administered.   0245: Patient agreed to put on mesh underwear and gown. Patient is resting comfortably in the chair at this time.

## 2023-11-09 NOTE — Plan of Care (Signed)
  Problem: Clinical Measurements: Goal: Cardiovascular complication will be avoided Outcome: Progressing   Problem: Activity: Goal: Risk for activity intolerance will decrease Outcome: Progressing   Problem: Nutrition: Goal: Adequate nutrition will be maintained Outcome: Progressing   Problem: Elimination: Goal: Will not experience complications related to bowel motility Outcome: Progressing Goal: Will not experience complications related to urinary retention Outcome: Progressing   Problem: Safety: Goal: Ability to remain free from injury will improve Outcome: Progressing   Problem: Coping: Goal: Level of anxiety will decrease Outcome: Not Progressing

## 2023-11-09 NOTE — Progress Notes (Signed)
 PROGRESS NOTE    Heidi Fuentes  FMW:[id number] DOB: 04-Aug-1945 DOA: 10/25/2023 PCP: [name], DO    Brief Narrative:   78 y.o. female with medical history significant for asthma, bipolar disorder, hypertension, chronic pain/fibromyalgia, and bronchiectasis presented with altered mental status.  At the time of evaluation by hospitalist on admission, patient was awake and alert and oriented to person but not time.  She was slightly tachycardic, tachypneic.  UA not suggestive of UTI.  Ammonia 18.  Head CT negative for acute abnormality.  Portable chest x-ray unremarkable.  MRI of brain was negative for any acute abnormality.  During early part of the hospitalization, the patient remained primarily agitated.  She refused to take her medications and would frequently spit them out.  Her oral intake was poor.  Her metabolic derangements were optimized.  Workup for reversible causes including serum B12, folic acid , TSH, and ammonia were largely unremarkable. Many of the patient's medications were discontinued including some of her psychotropic medications as the patient was not properly taking them or taking them at all at home. As time progressed, the patient was more alert and more willing to take her medications.  She was more communicative although remained pleasantly confused.  She would have episodes of argumentativeness but spoke understandably.  Social services was contacted to assist with guardianship.     Assessment & Plan:  Acute metabolic or toxic encephalopathy and Delirium  -Unclear etiology but could very well be progressive cognitive impairment/polypharmacy.  Throughout the hospital course her CT/MRI brain has been negative.  UA and other metabolic issues have been overall unremarkable.  Multiple psychiatry medications has been discontinued/adjusted.  Currently patient is on Prozac , Lamictal  and Haldol  as needed.  Overall appears to be better. -Currently awaiting guardianship    Bipolar disorder IRIS team saw the patient on 9/29, does not meet inpatient criteria.  Currently mentation is much better and awaiting SNF placement per therapy recommendations   Hypothyroidism -Continue levothyroxine  -TSH 1.425   Hypertension -Blood pressure somewhat fluctuating.  Currently on metoprolol  and restart Cozaar .  IV as needed   GERD Continue Protonix  if pt takes it   COPD - Continue current bronchodilators   Chronic pain - Apparently she is not taking oxycodone  anymore.  -Outpatient follow-up with PCP and/or pain management   Anxiety/depression -Meds restarted including Lamictal  and Prozac    Physical deconditioning - PT eval--requested and recommendation is for SNF - TOC working on SNF placement    Hypokalemia - Resolved   DVT prophylaxis: Lovenox     Code Status: Full Code Family Communication:   Status is: Inpatient Remains inpatient appropriate because: TOC working on guardianship and SNF placement.    PT Follow up Recs: Skilled Nursing-Short Term Rehab (<3 Hours/Day)11/05/2023 1516  Subjective:  Seen at bedside.  Slightly agitated but mumbles some words.  Does not appear to be any acute distress  Examination:  General exam: Appears calm and comfortable  Respiratory system: Clear to auscultation. Respiratory effort normal. Cardiovascular system: S1 & S2 heard, RRR. No JVD, murmurs, rubs, gallops or clicks. No pedal edema. Gastrointestinal system: Abdomen is nondistended, soft and nontender. No organomegaly or masses felt. Normal bowel sounds heard. Central nervous system: Alert and oriented to name only. No focal neurological deficits. Extremities: Symmetric 5 x 5 power. Skin: No rashes, lesions or ulcers Psychiatry: Judgement and insight appear poor                Diet Orders (From admission, onward)  Start     Ordered   10/27/23 1501  DIET DYS 2 Room service appropriate? Yes; Fluid consistency: Thin  Diet effective now        Comments: Meds ok whole with liquids  Question Answer Comment  Room service appropriate? Yes   Fluid consistency: Thin      10/27/23 1500            Objective: Vitals:   11/09/23 0523 11/09/23 0646 11/09/23 0828 11/09/23 0947  BP: (!) 148/72  137/69   Pulse: 81  67   Resp: 17  20   Temp: (!) 97.4 F (36.3 C)  98 F (36.7 C)   TempSrc:   Oral   SpO2: 98%  100% 100%  Weight:  50.7 kg    Height:        Intake/Output Summary (Last 24 hours) at 11/09/2023 1146 Last data filed at 11/08/2023 1700 Gross per 24 hour  Intake 440 ml  Output --  Net 440 ml   Filed Weights   11/07/23 0500 11/08/23 0500 11/09/23 0646  Weight: 51.5 kg 51 kg 50.7 kg    Scheduled Meds:  (feeding supplement) PROSource Plus  30 mL Oral TID BM   acetaminophen   1,000 mg Oral Q8H   enoxaparin  (LOVENOX ) injection  40 mg Subcutaneous Q24H   feeding supplement  237 mL Oral BID BM   FLUoxetine   40 mg Oral Daily   fluticasone  furoate-vilanterol  1 puff Inhalation Daily   gabapentin   300 mg Oral TID   lamoTRIgine   25 mg Oral BID   latanoprost   1 drop Both Eyes QHS   levothyroxine   25 mcg Oral QAC breakfast   lidocaine   1 patch Transdermal Q24H   losartan   25 mg Oral Daily   melatonin  3 mg Oral QHS   metoprolol  tartrate  12.5 mg Oral BID   multivitamin with minerals  1 tablet Oral Daily   pantoprazole   40 mg Oral Daily   Continuous Infusions:  Nutritional status Signs/Symptoms: per patient/family report Interventions: Ensure Enlive (each supplement provides 350kcal and 20 grams of protein), Magic cup, MVI, Prostat Body mass index is 22.58 kg/m.  Data Reviewed:   CBC: Recent Labs  Lab 11/02/23 1352 11/04/23 0456  WBC 6.8  --   HGB 14.0  --   HCT 42.5 43.2  MCV 98.6  --   PLT 233  --    Basic Metabolic Panel: Recent Labs  Lab 11/04/23 0456  NA 142  K 3.7  CL 104  CO2 23  GLUCOSE 74  BUN 28*  CREATININE 0.65  CALCIUM  9.5  MG 2.3  PHOS 3.8   GFR: Estimated Creatinine  Clearance: 39.5 mL/min (by C-G formula based on SCr of 0.65 mg/dL). Liver Function Tests: No results for input(s): AST, ALT, ALKPHOS, BILITOT, PROT, ALBUMIN in the last 168 hours. No results for input(s): LIPASE, AMYLASE in the last 168 hours. No results for input(s): AMMONIA in the last 168 hours. Coagulation Profile: No results for input(s): INR, PROTIME in the last 168 hours. Cardiac Enzymes: No results for input(s): CKTOTAL, CKMB, CKMBINDEX, TROPONINI in the last 168 hours. BNP (last 3 results) No results for input(s): PROBNP in the last 8760 hours. HbA1C: No results for input(s): HGBA1C in the last 72 hours. CBG: No results for input(s): GLUCAP in the last 168 hours. Lipid Profile: No results for input(s): CHOL, HDL, LDLCALC, TRIG, CHOLHDL, LDLDIRECT in the last 72 hours. Thyroid  Function Tests: No results for input(s): TSH, T4TOTAL,  FREET4, T3FREE, THYROIDAB in the last 72 hours. Anemia Panel: No results for input(s): VITAMINB12, FOLATE, FERRITIN, TIBC, IRON, RETICCTPCT in the last 72 hours. Sepsis Labs: No results for input(s): PROCALCITON, LATICACIDVEN in the last 168 hours.  No results found for this or any previous visit (from the past 240 hours).       Radiology Studies: No results found.         LOS: 14 days   Time spent= 35 mins    Burgess JAYSON Dare, MD [facility]  If 7PM-7AM, please contact night-coverage  11/09/2023, 11:46 AM

## 2023-11-10 DIAGNOSIS — G9340 Encephalopathy, unspecified: Secondary | ICD-10-CM | POA: Diagnosis not present

## 2023-11-10 LAB — BASIC METABOLIC PANEL WITH GFR
Anion gap: 12 (ref 5–15)
BUN: 16 mg/dL (ref 8–23)
CO2: 25 mmol/L (ref 22–32)
Calcium: 9.3 mg/dL (ref 8.9–10.3)
Chloride: 101 mmol/L (ref 98–111)
Creatinine, Ser: 0.68 mg/dL (ref 0.44–1.00)
GFR, Estimated: >60 mL/min (ref >60)
Glucose, Bld: 102 mg/dL — ABNORMAL HIGH (ref 70–99)
Potassium: 4.3 mmol/L (ref 3.5–5.1)
Sodium: 138 mmol/L (ref 135–145)

## 2023-11-10 LAB — PHOSPHORUS: Phosphorus: 4.3 mg/dL (ref 2.5–4.6)

## 2023-11-10 LAB — CBC
HCT: 39.9 % (ref 36.0–46.0)
Hemoglobin: 12.8 g/dL (ref 12.0–15.0)
MCH: 32.1 pg (ref 26.0–34.0)
MCHC: 32.1 g/dL (ref 30.0–36.0)
MCV: 100 fL (ref 80.0–100.0)
Platelets: 223 K/uL (ref 150–400)
RBC: 3.99 MIL/uL (ref 3.87–5.11)
RDW: 12.3 % (ref 11.5–15.5)
WBC: 10 K/uL (ref 4.0–10.5)
nRBC: 0 % (ref 0.0–0.2)

## 2023-11-10 LAB — MAGNESIUM: Magnesium: 2.5 mg/dL — ABNORMAL HIGH (ref 1.7–2.4)

## 2023-11-10 MED ORDER — KETOROLAC TROMETHAMINE 15 MG/ML IJ SOLN
15.0000 mg | Freq: Four times a day (QID) | INTRAMUSCULAR | Status: AC | PRN
  Administered 2023-11-10 – 2023-11-14 (×6): 15 mg via INTRAVENOUS
  Filled 2023-11-10 (×6): qty 1

## 2023-11-10 NOTE — Progress Notes (Signed)
 PROGRESS NOTE  Heidi Fuentes FMW:[id number] DOB: [birth year] DOA: 10/25/2023 PCP: [name], DO   LOS: 15 days   Brief narrative:  78 y.o. female with past medical history significant for asthma, bipolar disorder, hypertension, chronic pain/fibromyalgia, and bronchiectasis presented to the hospital with altered mental status.  She was also noted to be tachycardic and tachypneic.  Labs were notable for negative CT head scan.  Chest x-ray was negative.  MRI of the brain was negative as well.  UA not suggestive of infection. During early part of the hospitalization, the patient remained primarily agitated.  She refused to take her medications and would frequently spit them out.  Her oral intake was poor.  Her metabolic derangements were optimized.  Workup for reversible causes including serum B12, folic acid , TSH, and ammonia were largely unremarkable.  Most of her medications were discontinued as the patient was not appropriately taking that at home.  Subsequently patient was willing to take her medications.  She did have argumentativeness and social services was consulted to assist with guardianship.   Assessment/Plan: Principal Problem:   Acute encephalopathy Active Problems:   Bipolar affective disorder, depressed, mild (HCC)   Acquired hypothyroidism   Chronic narcotic use   Major neurocognitive disorder (HCC)  Acute metabolic/toxic encephalopathy with delirium  With possible progressive cognitive impairment/ polypharmacy.  Imaging and blood work are mostly negative.  Multiple psychiatry medications has been discontinued/adjusted.  Currently patient is on Prozac , Lamictal  and Haldol  as needed.  Has been having some episodes of agitation. Currently awaiting guardianship.  Needing sitter today due to agitation.   Bipolar disorder IRIS team saw the patient on 9/29, does not meet inpatient criteria.  awaiting SNF placement per therapy.  Continue Prozac  Lamictal  and Haldol .      Hypothyroidism Continue Synthroid .  Latest TSH of 1.4   Essential hypertension Continue metoprolol  and Cozaar    GERD Continue Protonix    COPD Continue bronchodilators   Chronic pain Not on oxycodone  anymore.   Anxiety/depression Continue Lamictal  and Prozac    Physical deconditioning Continue physical therapy and recommended skilled nursing facility placement.  Hypokalemia Replenished and improved.  Latest potassium of 4.3.   DVT prophylaxis: enoxaparin  (LOVENOX ) injection 40 mg Start: 10/25/23 2030   Disposition: Skilled nursing facility.  Status is: Inpatient Remains inpatient appropriate because: Awaiting guardianship and placement    Code Status:     Code Status: Full Code  Family Communication: None at bedside  Consultants: TTS/IRIS  Procedures: None  Anti-infectives:  None  Anti-infectives (From admission, onward)    None      Subjective:  Today, patient was seen and examined at bedside.  Nursing staff reported that she has been agitated, one-to-one sitter at bedside  Objective: Vitals:   11/10/23 0414 11/10/23 0726  BP: (!) 147/67   Pulse: (!) 56   Resp: 20   Temp: (!) 97.5 F (36.4 C)   SpO2: 100% 98%   No intake or output data in the 24 hours ending 11/10/23 0754 Filed Weights   11/08/23 0500 11/09/23 0646 11/10/23 0500  Weight: 51 kg 50.7 kg 50.1 kg   Body mass index is 22.31 kg/m.   Physical Exam:  GENERAL: Patient is alert awake and Communicative, agitated, thinly built. HENT: No scleral pallor or icterus. Pupils equally reactive to light. Oral mucosa is moist NECK: is supple, no gross swelling noted. CHEST: Clear to auscultation. No crackles or wheezes.   CVS: S1 and S2 heard, no murmur. Regular rate and rhythm.  ABDOMEN: Soft, non-tender, bowel sounds are present. EXTREMITIES: No edema. CNS: Cranial nerves are intact. No focal motor deficits. SKIN: warm and dry without rashes.  Data Review: I have personally  reviewed the following laboratory data and studies,  CBC: Recent Labs  Lab 11/04/23 0456 11/10/23 0016  WBC  --  10.0  HGB  --  12.8  HCT 43.2 39.9  MCV  --  100.0  PLT  --  223   Basic Metabolic Panel: Recent Labs  Lab 11/04/23 0456 11/10/23 0016  NA 142 138  K 3.7 4.3  CL 104 101  CO2 23 25  GLUCOSE 74 102*  BUN 28* 16  CREATININE 0.65 0.68  CALCIUM  9.5 9.3  MG 2.3 2.5*  PHOS 3.8 4.3   Liver Function Tests: No results for input(s): AST, ALT, ALKPHOS, BILITOT, PROT, ALBUMIN in the last 168 hours. No results for input(s): LIPASE, AMYLASE in the last 168 hours. No results for input(s): AMMONIA in the last 168 hours. Cardiac Enzymes: No results for input(s): CKTOTAL, CKMB, CKMBINDEX, TROPONINI in the last 168 hours. BNP (last 3 results) No results for input(s): BNP in the last 8760 hours.  ProBNP (last 3 results) No results for input(s): PROBNP in the last 8760 hours.  CBG: No results for input(s): GLUCAP in the last 168 hours. No results found for this or any previous visit (from the past 240 hours).   Studies: No results found.    Lorrinda Ramstad, MD  [facility] 11/10/2023  If 7PM-7AM, please contact night-coverage

## 2023-11-10 NOTE — TOC Progression Note (Signed)
 Transition of Care [facility]) - Progression Note    Patient Details  Name: Heidi Fuentes MRN: [id number] Date of Birth: 11-29-1945  [facility] CM/SW Contact  Lucie Lunger, [place] Phone Number: 11/10/2023, 9:55 AM  Clinical Narrative:    Aniya with APS called to update that if MD was willing a note stating pts lack of capacity would be helpful if MD was willing, CSW updated Dr. Caleen, note was completed. CSW sent copy of note securely to Aniya's email. Elease states she will reach out to CSW with update when she has a court date. TOC to follow.   Expected Discharge Plan: Skilled Nursing Facility Barriers to Discharge: Unsafe home situation, DSS Guardianship  DSS Guardianship request start date: 10/27/23 DSS Service county:  [place], transferred to [place] due to conflict of interest) DSS Date of decision notification: 10/28/23 DSS Guardianship decision: Substantiated      Expected Discharge Plan and Services In-house Referral: Clinical Social Work Discharge Planning Services: NA Post Acute Care Choice: Nursing Home, Skilled Nursing Facility Living arrangements for the past 2 months: Single Family Home                                       Social Drivers of Health (SDOH) Interventions SDOH Screenings   Food Insecurity: No Food Insecurity (10/25/2023)  Housing: Low Risk  (10/25/2023)  Transportation Needs: No Transportation Needs (10/25/2023)  Utilities: Not At Risk (10/25/2023)  Social Connections: Unknown (10/25/2023)  Tobacco Use: Low Risk  (10/25/2023)    Readmission Risk Interventions    11/03/2023   12:54 PM 10/29/2023   10:31 AM 10/28/2023   10:36 AM  Readmission Risk Prevention Plan  Transportation Screening Complete Complete Complete  PCP or Specialist Appt within 5-7 Days   Complete  Home Care Screening  Complete Complete  Medication Review (RN CM)  Complete Complete  HRI or Home Care Consult Complete    Social Work Consult for Recovery Care  Planning/Counseling Complete    Palliative Care Screening Not Applicable    Medication Review Oceanographer) Complete

## 2023-11-10 NOTE — Plan of Care (Signed)
   Problem: Education: Goal: Knowledge of General Education information will improve Description: Including pain rating scale, medication(s)/side effects and non-pharmacologic comfort measures Outcome: Progressing   Problem: Clinical Measurements: Goal: Ability to maintain clinical measurements within normal limits will improve Outcome: Progressing Goal: Diagnostic test results will improve Outcome: Progressing

## 2023-11-10 NOTE — Plan of Care (Signed)

## 2023-11-11 DIAGNOSIS — G9340 Encephalopathy, unspecified: Secondary | ICD-10-CM | POA: Diagnosis not present

## 2023-11-11 MED ORDER — GABAPENTIN 300 MG PO CAPS
600.0000 mg | ORAL_CAPSULE | Freq: Three times a day (TID) | ORAL | Status: DC
  Administered 2023-11-11 – 2023-11-26 (×43): 600 mg via ORAL
  Filled 2023-11-11 (×45): qty 2

## 2023-11-11 NOTE — Progress Notes (Signed)
 Mobility Specialist Progress Note:    11/11/23 0940  Mobility  Activity Ambulated with assistance  Level of Assistance Standby assist, set-up cues, supervision of patient - no hands on  Assistive Device Other (Comment) (1 HHA)  Distance Ambulated (ft) 120 ft  Range of Motion/Exercises Active;All extremities  Activity Response Tolerated well  Mobility Referral Yes  Mobility visit 1 Mobility  Mobility Specialist Start Time (ACUTE ONLY) 0940  Mobility Specialist Stop Time (ACUTE ONLY) 1000  Mobility Specialist Time Calculation (min) (ACUTE ONLY) 20 min   Pt received standing at sink, agreeable to mobility. Required CGA to ambulate with 1 hand-held assist. Tolerated well,confused. Left in chair, all needs met.  Malu Pellegrini Mobility Specialist Please contact via Special educational needs teacher or  Rehab office at [phone] '

## 2023-11-11 NOTE — Progress Notes (Signed)
 PROGRESS NOTE  Heidi Fuentes FMW:[id number] DOB: 1945-03-06 DOA: 10/25/2023 PCP: [name], DO   LOS: 16 days   Brief narrative:  78 y.o. female with past medical history significant for asthma, bipolar disorder, hypertension, chronic pain/fibromyalgia, and bronchiectasis presented to the hospital with altered mental status.  She was also noted to be tachycardic and tachypneic.  Labs were notable for negative CT head scan.  Chest x-ray was negative.  MRI of the brain was negative as well.  UA not suggestive of infection. During early part of the hospitalization, the patient remained primarily agitated.  She refused to take her medications and would frequently spit them out.  Her oral intake was poor.  Her metabolic derangements were optimized.  Workup for reversible causes including serum B12, folic acid , TSH, and ammonia were largely unremarkable.  Most of her medications were discontinued as the patient was not appropriately taking that at home.  Subsequently patient was willing to take her medications.  She did have argumentativeness and social services was consulted to assist with guardianship.  Currently awaiting for safe disposition.   Assessment/Plan: Principal Problem:   Acute encephalopathy Active Problems:   Bipolar affective disorder, depressed, mild (HCC)   Acquired hypothyroidism   Chronic narcotic use   Major neurocognitive disorder (HCC)  Acute metabolic/toxic encephalopathy with delirium  With possible progressive cognitive impairment/ polypharmacy.  Imaging and blood work are mostly negative.  Multiple psychiatry medications has been discontinued/adjusted.  Currently patient is on Prozac , Lamictal  and Haldol  as needed. Currently awaiting guardianship.  Needed sitter due to agitation yesterday   Bipolar disorder IRIS team saw the patient on 9/29, does not meet inpatient criteria.  awaiting SNF placement per therapy.  Continue Prozac  Lamictal  and Haldol .      Hypothyroidism Continue Synthroid .  Latest TSH of 1.4   Essential hypertension Continue metoprolol  and Cozaar    GERD Continue Protonix    COPD Continue bronchodilators   Chronic pain Not on oxycodone  anymore.  Will increase the dose of gabapentin  to her home dose from today.   Anxiety/depression Continue Lamictal  and Prozac    Physical deconditioning Continue physical therapy and recommended skilled nursing facility placement.  Hypokalemia Replenished and improved.  Latest potassium of 4.3.   DVT prophylaxis: enoxaparin  (LOVENOX ) injection 40 mg Start: 10/25/23 2030   Disposition: Skilled nursing facility.  Status is: Inpatient Remains inpatient appropriate because: Awaiting guardianship and placement    Code Status:     Code Status: Full Code  Family Communication: None at bedside  Consultants: TTS/IRIS  Procedures: None  Anti-infectives:  None  Anti-infectives (From admission, onward)    None      Subjective:  Today, patient was seen and examined at bedside.  Patient denies interval complaints.  Wishes her gabapentin  from home dosing.   Objective: Vitals:   11/11/23 0900 11/11/23 0908  BP: (!) 140/74   Pulse: 61 (!) 58  Resp:    Temp:    SpO2:      Intake/Output Summary (Last 24 hours) at 11/11/2023 1016 Last data filed at 11/10/2023 1829 Gross per 24 hour  Intake 480 ml  Output --  Net 480 ml   Filed Weights   11/09/23 0646 11/10/23 0500 11/11/23 0520  Weight: 50.7 kg 50.1 kg 51.1 kg   Body mass index is 22.75 kg/m.   Physical Exam:  GENERAL: Patient is alert awake and Communicative, agitated, thinly built. HENT: No scleral pallor or icterus. Pupils equally reactive to light. Oral mucosa is moist NECK: is  supple, no gross swelling noted. CHEST: Clear to auscultation. No crackles or wheezes.   CVS: S1 and S2 heard, no murmur. Regular rate and rhythm.  ABDOMEN: Soft, non-tender, bowel sounds are present. EXTREMITIES: No  edema. CNS: Cranial nerves are intact. No focal motor deficits. SKIN: warm and dry without rashes.  Data Review: I have personally reviewed the following laboratory data and studies,  CBC: Recent Labs  Lab 11/10/23 0016  WBC 10.0  HGB 12.8  HCT 39.9  MCV 100.0  PLT 223   Basic Metabolic Panel: Recent Labs  Lab 11/10/23 0016  NA 138  K 4.3  CL 101  CO2 25  GLUCOSE 102*  BUN 16  CREATININE 0.68  CALCIUM  9.3  MG 2.5*  PHOS 4.3   Liver Function Tests: No results for input(s): AST, ALT, ALKPHOS, BILITOT, PROT, ALBUMIN in the last 168 hours. No results for input(s): LIPASE, AMYLASE in the last 168 hours. No results for input(s): AMMONIA in the last 168 hours. Cardiac Enzymes: No results for input(s): CKTOTAL, CKMB, CKMBINDEX, TROPONINI in the last 168 hours. BNP (last 3 results) No results for input(s): BNP in the last 8760 hours.  ProBNP (last 3 results) No results for input(s): PROBNP in the last 8760 hours.  CBG: No results for input(s): GLUCAP in the last 168 hours. No results found for this or any previous visit (from the past 240 hours).   Studies: No results found.    Thana Ramp, MD  [facility] 11/11/2023  If 7PM-7AM, please contact night-coverage

## 2023-11-11 NOTE — Plan of Care (Signed)

## 2023-11-12 ENCOUNTER — Encounter (HOSPITAL_COMMUNITY): Payer: Self-pay | Admitting: Internal Medicine

## 2023-11-12 DIAGNOSIS — Z556 Problems related to health literacy: Secondary | ICD-10-CM | POA: Diagnosis not present

## 2023-11-12 DIAGNOSIS — F0390 Unspecified dementia without behavioral disturbance: Secondary | ICD-10-CM | POA: Diagnosis not present

## 2023-11-12 DIAGNOSIS — E039 Hypothyroidism, unspecified: Secondary | ICD-10-CM | POA: Diagnosis not present

## 2023-11-12 DIAGNOSIS — F99 Mental disorder, not otherwise specified: Secondary | ICD-10-CM

## 2023-11-12 DIAGNOSIS — G9341 Metabolic encephalopathy: Secondary | ICD-10-CM | POA: Diagnosis not present

## 2023-11-12 LAB — CBC
HCT: 34.2 % — ABNORMAL LOW (ref 36.0–46.0)
Hemoglobin: 11.3 g/dL — ABNORMAL LOW (ref 12.0–15.0)
MCH: 32.8 pg (ref 26.0–34.0)
MCHC: 33 g/dL (ref 30.0–36.0)
MCV: 99.1 fL (ref 80.0–100.0)
Platelets: 188 K/uL (ref 150–400)
RBC: 3.45 MIL/uL — ABNORMAL LOW (ref 3.87–5.11)
RDW: 12.3 % (ref 11.5–15.5)
WBC: 9.2 K/uL (ref 4.0–10.5)
nRBC: 0 % (ref 0.0–0.2)

## 2023-11-12 MED ORDER — ENSURE PLUS HIGH PROTEIN PO LIQD: 237.0000 mL | Freq: Two times a day (BID) | ORAL | Status: DC | PRN

## 2023-11-12 NOTE — Assessment & Plan Note (Signed)
 Prior to 11/12/23 Pt admitted on 10-25-2023. With possible progressive cognitive impairment/ polypharmacy.  Imaging and blood work are mostly negative.  Multiple psychiatry medications has been discontinued/adjusted.  Currently patient is on Prozac , Lamictal  and Haldol  as needed. Currently awaiting guardianship.  Needed sitter due to agitation yesterday   11/12/23 resolved acute metabolic encephalopathy

## 2023-11-12 NOTE — TOC Progression Note (Signed)
 Transition of Care [facility]) - Progression Note    Patient Details  Name: Heidi Fuentes MRN: [id number] Date of Birth: 1945/10/14  Transition of Care [facility]) CM/SW Contact  Lucie Lunger, [place] Phone Number: 11/12/2023, 9:29 AM  Clinical Narrative:    CSW reached out to [facility] with [facility] requesting update. No answer, secure voicemail was left requesting call back with update as soon as possible. TOC to follow.   Expected Discharge Plan: Skilled Nursing Facility Barriers to Discharge: Unsafe home situation, DSS Guardianship  DSS Guardianship request start date: 10/27/23 DSS Service county:  [place], transferred to [place] due to conflict of interest) DSS Date of decision notification: 10/28/23 DSS Guardianship decision: Substantiated      Expected Discharge Plan and Services In-house Referral: Clinical Social Work Discharge Planning Services: NA Post Acute Care Choice: Nursing Home, Skilled Nursing Facility Living arrangements for the past 2 months: Single Family Home                                       Social Drivers of Health (SDOH) Interventions SDOH Screenings   Food Insecurity: No Food Insecurity (10/25/2023)  Housing: Low Risk  (10/25/2023)  Transportation Needs: No Transportation Needs (10/25/2023)  Utilities: Not At Risk (10/25/2023)  Social Connections: Unknown (10/25/2023)  Tobacco Use: Low Risk  (10/25/2023)    Readmission Risk Interventions    11/03/2023   12:54 PM 10/29/2023   10:31 AM 10/28/2023   10:36 AM  Readmission Risk Prevention Plan  Transportation Screening Complete Complete Complete  PCP or Specialist Appt within 5-7 Days   Complete  Home Care Screening  Complete Complete  Medication Review (RN CM)  Complete Complete  HRI or Home Care Consult Complete    Social Work Consult for Recovery Care Planning/Counseling Complete    Palliative Care Screening Not Applicable    Medication Review [employer]) Complete

## 2023-11-12 NOTE — Progress Notes (Signed)
 PROGRESS NOTE    Heidi Fuentes  FMW:[id number] DOB: Nov 27, 1945 DOA: 10/25/2023 PCP: [name], DO  Subjective: Pt seen and examined. Pt is quite dramatic. With intermittent echolalia. Reminds me of the mad hatter from alice in [place].   Hospital Course: CC: altered mental status HPI: Heidi Fuentes is a 78 y.o. female with medical history significant for asthma, bipolar disorder, hypertension, chronic pain, and bronchiectasis. Patient was brought to the ED via EMS for reports of altered mental status.  Patient is alone but has a caregiver, she called patient's son the patient was not her normal self, and he said to bring patient to the ED.  The time of my evaluation, patient is awake and alert and oriented to person, but not time, she is unable to tell me why she is here. She appears to answer other questions appropriately.  She denies difficulty breathing, no cough, no vomiting or diarrhea, no weakness of her extremities, endorses good oral intake.   She was hospitalized back in February- 2/1 to 2/6 also for altered mental status thought secondary to COVID infection and  hypoxia.   ED Course: Temperature 98.4.  Heart rate 92-118, respiratory rate 14-26.  Blood pressure systolic 131-156.  UA not suggestive of UTI.  Ammonia 18.  Potassium 3.3.  Head CT negative for acute abnormality.  Portable chest x-ray unremarkable. MRI ordered and pending  Significant Events: Admitted 10/25/2023 for acute metabolic encephalopathy Week of Sept 22 - Sept 27. TOC/CM reached out to pt's son Dempsey. Son is not involved with her care and will not take any responsibility for patient. Paperwork being filed for guardianship. Apparently pt's son is county commission for [place]. Week of Sept 28 - October 4. Tele-psych consulted. No indication for [facility] admission. TOC verified that [place] APS has completed interim guardianship paperwork and submitted it for review Week of October 5 - October 9.  Hospitalist documents that pt's lacks capacity for court.  Admission Labs: Na 137, K 3.3, CO2 of 23, BUN 25, Scr 0.70, glu 116 T. Prot 6.7, alb 3.9, AST 34, ALT 23, alk phos 72, t. Bili 1.0 WBC 8.1, HgB 14.5, plt 274 NH3 of 18 ETOH < 15 UA spg 1.027, negative nitrite, negative LE  Admission Imaging Studies: CXR No acute findings.  CT head No acute findings. 2. Mild chronic ischemic microvascular disease. MRI brain Significantly motion degraded examination. Within this limitation, findings are as follows. 2. No acute intracranial abnormality is identified. The diffusion-weighted imaging is of good quality and there is no evidence of an acute infarct. 3. Moderate chronic small vessel ischemic changes within the cerebral white matter. 4. Generalized cerebral atrophy.  Significant Labs:   Significant Imaging Studies:   Antibiotic Therapy: Anti-infectives (From admission, onward)    None       Procedures:   Consultants: Tele-psych    Assessment and Plan: * Acute metabolic encephalopathy-resolved as of 11/12/2023 Prior to 11/12/23 Pt admitted on 10-25-2023. With possible progressive cognitive impairment/ polypharmacy.  Imaging and blood work are mostly negative.  Multiple psychiatry medications has been discontinued/adjusted.  Currently patient is on Prozac , Lamictal  and Haldol  as needed. Currently awaiting guardianship.  Needed sitter due to agitation yesterday   11/12/23 resolved acute metabolic encephalopathy   Unable to make decisions about medical treatment due to impaired mental capacity 11/12/23 guardianship being pursued in [facility] due to pt's son being a [employer]in crew of [employer] and the patient's daughter-in-law working as a Solicitor of courts  in [place].   Major neurocognitive disorder (HCC) 11/12/23 pt lacks capacity to make her own medical decision. Pt's son does not want to take any responsibility for her medical care. TOC/CM awaiting to hear  back from [facility] on when her guardianship papers will be filed with [facility] given pt's son and dtr-in-law conflict of interest.   Bipolar affective disorder, depressed, mild (HCC) Prior to 11/12/23 pt seen by tele-psych. Pt does not meet criteria for inpatient psych treatment  11/12/23 remains on prozac .    Acquired hypothyroidism 11/12/23 remains on synthroid .  TSH is normal at 1.425  Thyroid  Labs: Lab Results  Component Value Date/Time   TSH 1.425 10/27/2023 04:54 AM   TSH 1.640 07/09/2013 07:55 PM      Chronic narcotic use-resolved as of 11/12/2023 Prior to 11/12/23 all her narcotics were stopped due to polypharmacy and concern that opiates and neurontin  were interfering with her mentation. Now resolved.   DVT prophylaxis: enoxaparin  (LOVENOX ) injection 40 mg Start: 10/25/23 2030    Code Status: Full Code Family Communication: no family members at bedside Disposition Plan: unknown Reason for continuing need for hospitalization: medically stable for discharge.  Objective: Vitals:   11/11/23 1251 11/11/23 2121 11/11/23 2123 11/12/23 0427  BP: 129/69 (!) 151/57 (!) 151/57 (!) 148/84  Pulse: 61 69 66 (!) 57  Resp: 20  18   Temp: (!) 97.4 F (36.3 C)  97.9 F (36.6 C) (!) 97.4 F (36.3 C)  TempSrc: Axillary  Oral Oral  SpO2: 97%  100% 98%  Weight:      Height:        Intake/Output Summary (Last 24 hours) at 11/12/2023 1423 Last data filed at 11/12/2023 0950 Gross per 24 hour  Intake 240 ml  Output 200 ml  Net 40 ml   Filed Weights   11/09/23 0646 11/10/23 0500 11/11/23 0520  Weight: 50.7 kg 50.1 kg 51.1 kg    Examination:  Physical Exam Vitals and nursing note reviewed.  Constitutional:      General: She is not in acute distress.    Appearance: She is not toxic-appearing.     Comments: awake  HENT:     Head: Normocephalic and atraumatic.  Cardiovascular:     Rate and Rhythm: Normal rate and regular rhythm.  Pulmonary:      Effort: Pulmonary effort is normal.     Breath sounds: Normal breath sounds.  Abdominal:     General: Bowel sounds are normal.     Palpations: Abdomen is soft.  Skin:    General: Skin is warm and dry.     Capillary Refill: Capillary refill takes less than 2 seconds.  Neurological:     Mental Status: She is disoriented.     Data Reviewed: I have personally reviewed following labs and imaging studies  CBC: Recent Labs  Lab 11/10/23 0016  WBC 10.0  HGB 12.8  HCT 39.9  MCV 100.0  PLT 223   Basic Metabolic Panel: Recent Labs  Lab 11/10/23 0016  NA 138  K 4.3  CL 101  CO2 25  GLUCOSE 102*  BUN 16  CREATININE 0.68  CALCIUM  9.3  MG 2.5*  PHOS 4.3   GFR: Estimated Creatinine Clearance: 39.5 mL/min (by C-G formula based on SCr of 0.68 mg/dL).  Scheduled Meds:  acetaminophen   1,000 mg Oral Q8H   enoxaparin  (LOVENOX ) injection  40 mg Subcutaneous Q24H   FLUoxetine   40 mg Oral Daily   fluticasone  furoate-vilanterol  1 puff  Inhalation Daily   gabapentin   600 mg Oral TID   lamoTRIgine   25 mg Oral BID   latanoprost   1 drop Both Eyes QHS   levothyroxine   25 mcg Oral QAC breakfast   lidocaine   1 patch Transdermal Q24H   losartan   25 mg Oral Daily   melatonin  3 mg Oral QHS   metoprolol  tartrate  12.5 mg Oral BID   multivitamin with minerals  1 tablet Oral Daily   pantoprazole   40 mg Oral Daily   Continuous Infusions:   LOS: 17 days   Time spent: 55 minutes  Camellia Door, DO  [facility]  11/12/2023, 2:23 PM

## 2023-11-12 NOTE — Assessment & Plan Note (Signed)
 Prior to 11/12/23 all her narcotics were stopped due to polypharmacy and concern that opiates and neurontin  were interfering with her mentation. Now resolved.

## 2023-11-12 NOTE — Assessment & Plan Note (Signed)
 11/12/23 pt lacks capacity to make her own medical decision. Pt's son does not want to take any responsibility for her medical care. TOC/CM awaiting to hear back from [facility] on when her guardianship papers will be filed with [facility] given pt's son and dtr-in-law conflict of interest.

## 2023-11-12 NOTE — Progress Notes (Addendum)
 Nutrition Follow-up  DOCUMENTATION CODES:   Not applicable  INTERVENTION:   D/C Prosource, patient is mostly refusing. Change Ensure Plus High Protein to BID prn meal completion less than 50%, each supplement provides 350 kcal and 20 grams of protein. Continue MVI with minerals. Continue Magic cup TID with meals, each supplement provides 290 kcal and 9 grams of protein.  NUTRITION DIAGNOSIS:   Inadequate oral intake related to poor appetite as evidenced by per patient/family report; ongoing.  GOAL:   Patient will meet greater than or equal to 90% of their needs; progressing.  MONITOR:   PO intake, Supplement acceptance  REASON FOR ASSESSMENT:   Malnutrition Screening Tool    ASSESSMENT:   Pt with medical history significant for asthma, bipolar disorder, hypertension, chronic pain, and bronchiectasis presented with altered mental status.  Patient in the bathroom during RD visit. Sitter in her room reports that she is eating very well; consumed 100% of breakfast today. She is receiving magic cups with meals.   Patient is currently on a dysphagia 2 diet with thin liquids. She is consuming 50-100% of meals. She is being offered Prosource Plus TID and Ensure Plus High Protein BID. She is mostly refusing the supplements, but was provided them by nursing this morning.   Patient continues to await determination of guardianship for safe d/c.   Labs reviewed. Medications reviewed and include gabapentin , MVI with minerals, protonix .  Admit weight 52 kg (9/22) Current weight 51.1 kg (10/9)  NUTRITION - FOCUSED PHYSICAL EXAM:  Unable to complete  Diet Order:   Diet Order             DIET DYS 2 Room service appropriate? Yes; Fluid consistency: Thin  Diet effective now                   EDUCATION NEEDS:   No education needs have been identified at this time  Skin:  Skin Assessment: Reviewed RN Assessment  Last BM:  10/8  Height:   Ht Readings from Last 1  Encounters:  10/25/23 4' 11 (1.499 m)    Weight:   Wt Readings from Last 1 Encounters:  11/11/23 51.1 kg    Ideal Body Weight:  44.5 kg  BMI:  Body mass index is 22.75 kg/m.  Estimated Nutritional Needs:   Kcal:  1450-1650  Protein:  70-85 grams  Fluid:  1.4-1.6 L   Suzen HUNT RD, LDN, CNSC Contact via secure chat. If unavailable, use group chat RD Inpatient.

## 2023-11-12 NOTE — Assessment & Plan Note (Signed)
 11/12/23 guardianship being pursued in [facility] due to pt's son being a [employer] of [place] and the patient's daughter-in-law working as a Solicitor of courts in [place].

## 2023-11-12 NOTE — Assessment & Plan Note (Signed)
 11/12/23 remains on synthroid .  TSH is normal at 1.425  Thyroid  Labs: Lab Results  Component Value Date/Time   TSH 1.425 10/27/2023 04:54 AM   TSH 1.640 07/09/2013 07:55 PM

## 2023-11-12 NOTE — Assessment & Plan Note (Signed)
 Prior to 11/12/23 pt seen by tele-psych. Pt does not meet criteria for inpatient psych treatment  11/12/23 remains on prozac .

## 2023-11-12 NOTE — Subjective & Objective (Signed)
 Pt seen and examined. Pt is quite dramatic. With intermittent echolalia. Reminds me of the mad hatter from alice in [place].

## 2023-11-13 DIAGNOSIS — G9341 Metabolic encephalopathy: Secondary | ICD-10-CM

## 2023-11-13 DIAGNOSIS — F0390 Unspecified dementia without behavioral disturbance: Secondary | ICD-10-CM | POA: Diagnosis not present

## 2023-11-13 DIAGNOSIS — F3131 Bipolar disorder, current episode depressed, mild: Secondary | ICD-10-CM | POA: Diagnosis not present

## 2023-11-13 LAB — BASIC METABOLIC PANEL WITH GFR
Anion gap: 9 (ref 5–15)
BUN: 25 mg/dL — ABNORMAL HIGH (ref 8–23)
CO2: 26 mmol/L (ref 22–32)
Calcium: 9.2 mg/dL (ref 8.9–10.3)
Chloride: 101 mmol/L (ref 98–111)
Creatinine, Ser: 0.77 mg/dL (ref 0.44–1.00)
GFR, Estimated: >60 mL/min (ref >60)
Glucose, Bld: 115 mg/dL — ABNORMAL HIGH (ref 70–99)
Potassium: 4.3 mmol/L (ref 3.5–5.1)
Sodium: 136 mmol/L (ref 135–145)

## 2023-11-13 LAB — MAGNESIUM: Magnesium: 2.5 mg/dL — ABNORMAL HIGH (ref 1.7–2.4)

## 2023-11-13 MED ORDER — HALOPERIDOL LACTATE 5 MG/ML IJ SOLN
1.0000 mg | Freq: Once | INTRAMUSCULAR | Status: AC
  Administered 2023-11-13: 1 mg via INTRAVENOUS

## 2023-11-13 MED ORDER — HALOPERIDOL LACTATE 5 MG/ML IJ SOLN: 2.0000 mg | Freq: Four times a day (QID) | INTRAMUSCULAR | Status: DC | PRN

## 2023-11-13 MED ORDER — HALOPERIDOL LACTATE 5 MG/ML IJ SOLN
INTRAMUSCULAR | Status: AC
  Filled 2023-11-13: qty 1

## 2023-11-13 MED ORDER — LORAZEPAM 0.5 MG PO TABS
0.5000 mg | ORAL_TABLET | ORAL | Status: DC | PRN
  Administered 2023-11-13 – 2023-11-22 (×4): 0.5 mg via ORAL
  Filled 2023-11-13 (×4): qty 1

## 2023-11-13 NOTE — Progress Notes (Signed)
 Second dose of haldol  given. Patient also reporting pain to the back, so we provided Toradol  per order as well. Dr. Vicci at bedside.

## 2023-11-13 NOTE — Progress Notes (Signed)
 PROGRESS NOTE  Heidi Fuentes FMW:[id number] DOB: Apr 24, 1945 DOA: 10/25/2023 PCP: [name], DO   LOS: 18 days   Brief narrative:  78 y.o. female with past medical history significant for asthma, bipolar disorder, hypertension, chronic pain/fibromyalgia, and bronchiectasis presented to the hospital with altered mental status.  She was also noted to be tachycardic and tachypneic.  Labs were notable for negative CT head scan.  Chest x-ray was negative.  MRI of the brain was negative as well.  UA not suggestive of infection. During early part of the hospitalization, the patient remained primarily agitated.  She refused to take her medications and would frequently spit them out.  Her oral intake was poor.  Her metabolic derangements were optimized.  Workup for reversible causes including serum B12, folic acid , TSH, and ammonia were largely unremarkable.  Most of her medications were discontinued as the patient was not appropriately taking that at home.  Subsequently patient was willing to take her medications.  She did have argumentativeness and social services was consulted to assist with guardianship.  Currently awaiting for safe disposition.  Assessment/Plan: Active Problems:   Acquired hypothyroidism   Bipolar affective disorder, depressed, mild (HCC)   Major neurocognitive disorder (HCC)   Unable to make decisions about medical treatment due to impaired mental capacity  Acute metabolic/toxic encephalopathy with delirium  With possible progressive cognitive impairment/ polypharmacy.  Imaging and blood work are mostly negative.  Multiple psychiatry medications has been discontinued/adjusted.  Currently patient is on Prozac , Lamictal  and Haldol  as needed. Currently awaiting guardianship.  Sitter and security for agitation today.  Increased haldol  dose    Bipolar disorder IRIS team saw the patient on 9/29, does not meet inpatient criteria.  awaiting SNF placement per therapy.  Continue  Prozac  Lamictal  and Haldol .     Hypothyroidism Continue Synthroid .  Latest TSH of 1.4   Essential hypertension Continue metoprolol  and Cozaar    GERD Continue Protonix    COPD Continue bronchodilators   Chronic pain Not on oxycodone  anymore.  Will increase the dose of gabapentin  to her home dose from today.   Anxiety/depression Continue Lamictal  and Prozac    Physical deconditioning Continue physical therapy and recommended skilled nursing facility placement.  Hypokalemia Replenished and improved.  Latest potassium of 4.3.   DVT prophylaxis: enoxaparin  (LOVENOX ) injection 40 mg Start: 10/25/23 2030   Disposition: Skilled nursing facility.  Status is: Inpatient Remains inpatient appropriate because: Awaiting guardianship and placement   Code Status:     Code Status: Full Code  Family Communication: None at bedside  Consultants: TTS/IRIS  Procedures: None  Anti-infectives:  None  Anti-infectives (From admission, onward)    None      Subjective:  Pt is severely agitated at the moment.   Objective: Vitals:   11/13/23 0519 11/13/23 0800  BP: (!) 109/57 (!) 148/61  Pulse: 65 (!) 52  Resp: 18 19  Temp: 98 F (36.7 C)   SpO2: 97% 99%    Intake/Output Summary (Last 24 hours) at 11/13/2023 1728 Last data filed at 11/13/2023 0610 Gross per 24 hour  Intake 360 ml  Output --  Net 360 ml   Filed Weights   11/10/23 0500 11/11/23 0520 11/13/23 0525  Weight: 50.1 kg 51.1 kg 50.1 kg   Body mass index is 22.31 kg/m.   Physical Exam:  GENERAL: pt is extremely agitated at the moment and severely delirious. HENT: No scleral pallor or icterus. Pupils equally reactive to light. Oral mucosa is moist NECK: is supple, no  gross swelling noted. CHEST: Clear to auscultation. No crackles or wheezes.   CVS: S1 and S2 heard, no murmur. Regular rate and rhythm.  ABDOMEN: Soft, non-tender, bowel sounds are present. EXTREMITIES: No edema. CNS: Cranial nerves are  intact. No focal motor deficits. SKIN: warm and dry without rashes.  Data Review: I have personally reviewed the following laboratory data and studies,  CBC: Recent Labs  Lab 11/10/23 0016 11/12/23 2342  WBC 10.0 9.2  HGB 12.8 11.3*  HCT 39.9 34.2*  MCV 100.0 99.1  PLT 223 188   Basic Metabolic Panel: Recent Labs  Lab 11/10/23 0016 11/12/23 2342  NA 138 136  K 4.3 4.3  CL 101 101  CO2 25 26  GLUCOSE 102* 115*  BUN 16 25*  CREATININE 0.68 0.77  CALCIUM  9.3 9.2  MG 2.5* 2.5*  PHOS 4.3  --    Liver Function Tests: No results for input(s): AST, ALT, ALKPHOS, BILITOT, PROT, ALBUMIN in the last 168 hours. No results for input(s): LIPASE, AMYLASE in the last 168 hours. No results for input(s): AMMONIA in the last 168 hours. Cardiac Enzymes: No results for input(s): CKTOTAL, CKMB, CKMBINDEX, TROPONINI in the last 168 hours. BNP (last 3 results) No results for input(s): BNP in the last 8760 hours.  ProBNP (last 3 results) No results for input(s): PROBNP in the last 8760 hours.  CBG: No results for input(s): GLUCAP in the last 168 hours. No results found for this or any previous visit (from the past 240 hours).   Studies: No results found.  Afton Louder, MD  [facility] 11/13/2023  If 7PM-7AM, please contact night-coverage

## 2023-11-13 NOTE — Progress Notes (Signed)
 Patient became combative with sitter at bedside. Security called and haldol  given IV per order. Patient now in bedside chair.

## 2023-11-14 DIAGNOSIS — G9341 Metabolic encephalopathy: Secondary | ICD-10-CM | POA: Diagnosis not present

## 2023-11-14 DIAGNOSIS — F0390 Unspecified dementia without behavioral disturbance: Secondary | ICD-10-CM | POA: Diagnosis not present

## 2023-11-14 DIAGNOSIS — F3131 Bipolar disorder, current episode depressed, mild: Secondary | ICD-10-CM | POA: Diagnosis not present

## 2023-11-14 DIAGNOSIS — E038 Other specified hypothyroidism: Secondary | ICD-10-CM | POA: Diagnosis not present

## 2023-11-14 DIAGNOSIS — F03918 Unspecified dementia, unspecified severity, with other behavioral disturbance: Secondary | ICD-10-CM

## 2023-11-14 LAB — URINALYSIS, ROUTINE W REFLEX MICROSCOPIC
Bilirubin Urine: NEGATIVE
Glucose, UA: NEGATIVE mg/dL
Ketones, ur: NEGATIVE mg/dL
Nitrite: NEGATIVE
Protein, ur: NEGATIVE mg/dL
Specific Gravity, Urine: 1.008 (ref 1.005–1.030)
pH: 7 (ref 5.0–8.0)

## 2023-11-14 LAB — AMMONIA: Ammonia: 17 umol/L (ref 9–35)

## 2023-11-14 MED ORDER — RISPERIDONE 0.5 MG PO TABS
0.2500 mg | ORAL_TABLET | Freq: Every day | ORAL | Status: DC
Start: 2023-11-14 — End: 2023-11-16
  Administered 2023-11-14 – 2023-11-15 (×2): 0.25 mg via ORAL
  Filled 2023-11-14 (×2): qty 1

## 2023-11-14 NOTE — Consult Note (Addendum)
 [facility] Psychiatric Consult Initial  Patient Name: .Heidi Fuentes  MRN: [id number]  DOB: 1945/04/27  Consult Order details:  Orders (From admission, onward)     Start     Ordered   11/14/23 1038  CONSULT TO CALL ACT TEAM       Ordering Provider: Vicci Afton CROME, MD  Provider:  (Not yet assigned)  Question:  Reason for Consult?  Answer:  acute pychosis medication managment - pt has been medically cleared   11/14/23 1037   11/14/23 1036  CONSULT TO CALL ACT TEAM       Comments: medical managment for acute psychosis -- pt is medically cleared  Ordering Provider: Vicci Afton CROME, MD  Provider:  (Not yet assigned)  Question:  Reason for Consult?  Answer:  medical managment for acute psychosis -- pt is medically cleared   11/14/23 1036   11/13/23 1158  CONSULT TO CALL ACT TEAM       Comments: Pt has been medically cleared  Ordering Provider: Vicci Afton CROME, MD  Provider:  (Not yet assigned)  Question:  Reason for Consult?  Answer:  medication management for uncontrolled psychosis   11/13/23 1158   11/01/23 1348  CONSULT TO CALL ACT TEAM       Comments: Pt medically cleared by MD  Ordering Provider: Vicci Afton CROME, MD  Provider:  (Not yet assigned)  Question:  Reason for Consult?  Answer:  medication management for uncontrolled bipolar disorder may need inpatient psychiatric treatment - medically cleared by MD   11/01/23 1348             Mode of Visit: As the provider for this telehealth consult, I attest that I verified the patient's identity using two separate identifiers, introduced myself to the patient, provided my credentials, disclosed my location, and performed this encounter via a HIPAA-compliant, real-time, face-to-face, two-way, interactive audio and video platform and with the full consent and agreement of the patient (or guardian as applicable.)  Patient physical location: [facility]. Telehealth provider physical location: home office in  state of [place].    Psychiatry Consult Evaluation  Service Date: November 14, 2023 LOS:  LOS: 19 days  Chief Complaint Follow-up evaluation for ongoing altered mental status and behavioral stability.  Primary Psychiatric Diagnoses  Bipolar Disorder, Unspecified 2.   Major Neurocognitive Disorder with Behavioral Disturbance  Assessment  Heidi Fuentes is a 78 y.o. female admitted: Presented to the EDfor 10/25/2023  2:48 PM for altered mental status and behavioral stability. She carries the psychiatric diagnoses of bipolar disorder and has a past medical history of asthma, hypertension, chronic pain/fibromyalgia, and bronchiectasis .   78 year old female with a history of bipolar disorder and chronic medical comorbidities presenting with residual cognitive disorganization and mild behavioral disturbance following an episode of delirium. She is currently cooperative, non-aggressive, and behaviorally stable.  Lamictal  25 mg BID provides long-term mood stabilization but does not address acute agitation or manic symptoms. Given her age and tolerance, the addition of a low-dose atypical antipsychotic is appropriate for mood stabilization and behavioral control. Please see plan below for detailed recommendations.   Diagnoses:  Active Hospital problems: Principal Problem:   Major neurocognitive disorder [facility]) Active Problems:   Acquired hypothyroidism   Bipolar affective disorder, depressed, mild (HCC)   Unable to make decisions about medical treatment due to impaired mental capacity    Plan   ## Psychiatric Medication Recommendations:  Psychiatrically cleared - patient stable for continued medical boarding.  Continue Lamictal  25 mg PO BID.  Initiate Risperidone  (Risperdal ) Start 0.25 mg PO daily, may increase slowly every 24-48 h to 0.25-0.5 mg BID.  Monitor for sedation, orthostatic hypotension, and QTc prolongation.  Continue PRN haloperidol /lorazepam  only for severe agitation.  Obtain  UDS for completeness.  Continue supportive care, hydration, and environmental orientation.  Reassess daily for cognitive and behavioral stability.  ## Medical Decision Making Capacity: Not specifically addressed in this encounter  ## Further Work-up:  -- Obtain UDS for completeness, will obtain Lamictal  level EKG, While pt on Qtc prolonging medications, please monitor & replete K+ to 4 and Mg2+ to 2, U/A, or UDS -- most recent EKG on 11/01/2023 had QtC of 471 -- Pertinent labwork reviewed earlier this admission includes: CBC, EKG, UDS, ammonia level, Lamictal  level   ## Disposition:-- Patient is psychiatrically cleared for continued hospitalization as a medical boarder. No acute safety concerns noted. Will continue behavioral monitoring and medication titration as outlined.  ## Behavioral / Environmental: -Delirium Precautions: Delirium Interventions for Nursing and Staff: - RN to open blinds every AM. - To Bedside: Glasses, hearing aide, and pt's own shoes. Make available to patients. when possible and encourage use. - Encourage po fluids when appropriate, keep fluids within reach. - OOB to chair with meals. - Passive ROM exercises to all extremities with AM & PM care. - RN to assess orientation to person, time and place QAM and PRN. - Recommend extended visitation hours with familiar family/friends as feasible. - Staff to minimize disturbances at night. Turn off television when pt asleep or when not in use., Difficult Patient (SELECT OPTIONS FROM BELOW), To minimize splitting of staff, assign one staff person to communicate all information from the team when feasible., or Utilize compassion and acknowledge the patient's experiences while setting clear and realistic expectations for care.    ## Safety and Observation Level:  - Based on my clinical evaluation, I estimate the patient to be at low risk of self harm in the current setting. - At this time, we recommend  1:1 Observation. This  decision is based on my review of the chart including patient's history and current presentation, interview of the patient, mental status examination, and consideration of suicide risk including evaluating suicidal ideation, plan, intent, suicidal or self-harm behaviors, risk factors, and protective factors. This judgment is based on our ability to directly address suicide risk, implement suicide prevention strategies, and develop a safety plan while the patient is in the clinical setting. Please contact our team if there is a concern that risk level has changed.  CSSR Risk Category:C-SSRS RISK CATEGORY: No Risk  Suicide Risk Assessment: Patient has following modifiable risk factors for suicide: medication noncompliance and triggering events, which we are addressing by continuing behavioral monitoring and medication titration. Patient has following non-modifiable or demographic risk factors for suicide: none Patient has the following protective factors against suicide: Supportive family and Supportive friends  Thank you for this consult request. Recommendations have been communicated to the primary team.  We will sign off at this time.   Heidi Fuentes, PMHNP       History of Present Illness  Relevant Aspects of Hospital ED Course:  Admitted on 10/25/2023 for Follow-up evaluation for ongoing altered mental status and behavioral stability.  Patient Report:  Patient is a 78 year old female originally brought to the ED by EMS on 10/25/2023 for altered mental status (AMS). Per EMS, she has a history of chronic UTIs. She was evaluated on 11/01/2023 for delirium and  cognitive impairment, at which time she was psychiatrically cleared and prescribed PRN haloperidol /lorazepam  for severe agitation.  On today's assessment, patient is alert and oriented 3, able to identify her environment, the president, and herself. Her speech is tangential and irrelevant at times; she frequently switches topics,  discussing "Dancing with the [employer], and other unrelated subjects. She demonstrates mild echolalia, repeating and rhyming words [employer], church, church, watching here, watching there").  When engaged, patient states that she lives alone and receives home-health assistance, though she reports disliking it because "they bother my belongings." She expresses a desire to return home for peace and quiet and reports she is "ready to get out of the hospital."  Per nursing staff, patient has been cooperative, compliant with medications, and without agitation or aggressive behavior in the past 24 hours.  Psych ROS:  Depression: Denies Anxiety: Denies Mania (lifetime and current):  Denies Psychosis: (lifetime and current):  Denies   Collateral information:  Contacted None  Review of Systems  Psychiatric/Behavioral:         Cognitive impairment     Psychiatric and Social History  Psychiatric History:  Information collected from chart review and patient  Prev Dx/Sx: Bipolar disorder Current Psych Provider:  Denies Home Meds (current): See above Previous Med Trials: Yes Therapy:  Denies  Prior Psych Hospitalization:  Denies  Prior Self Harm:  Denies Prior Violence: Yes  Family Psych History:  Denies Family Hx suicide:  Denies  Social History:  Developmental Hx: Deferred Educational Hx: Graduated high school Occupational Hx: Retired Armed forces operational officer Hx:  Denies Living Situation: Lives alone Spiritual Hx: Yes Access to weapons/lethal means:  Denies   Substance History Patient denies any current or past abuse of substance abuse history   Exam Findings  Physical Exam:  Vital Signs:  Temp:  [97.5 F (36.4 C)-97.8 F (36.6 C)] 97.8 F (36.6 C) (10/12 0546) Pulse Rate:  [64] 64 (10/11 2102) Resp:  [20] 20 (10/12 0546) BP: (98-168)/(81-83) 168/81 (10/12 0546) SpO2:  [97 %-99 %] 97 % (10/12 0838) Weight:  [51.6 kg] 51.6 kg (10/12 0536) Blood pressure (!) 168/81, pulse 64,  temperature 97.8 F (36.6 C), resp. rate 20, height 4' 11 (1.499 m), weight 51.6 kg, SpO2 97%. Body mass index is 22.98 kg/m.  Physical Exam Vitals and nursing note reviewed. Exam conducted with a chaperone present.  Neurological:     Mental Status: She is alert.  Psychiatric:        Attention and Perception: Attention normal.        Mood and Affect: Mood normal.        Behavior: Behavior is cooperative.        Thought Content: Thought content normal.        Cognition and Memory: Cognition is impaired.     Comments: Speech: Rapid, tangential, occasionally rhyming (echolalia)     Mental Status Exam: General Appearance: Elderly female, well-groomed, sitting upright in bed  Orientation:  Full (Time, Place, and Person)  Memory:  Immediate;   Fair Recent;   Fair  Concentration:  Concentration: Fair  Recall:  Poor  Attention  Fair  Eye Contact:  Fair  Speech:  Rapid, tangential, occasionally rhyming (echolalia)  Language:  Fair  Volume:  Normal  Mood: "Ready to go home"  Affect:  Bright, labile, congruent  Thought Process:  Disorganized, circumstantial  Thought Content:  No delusions elicited; denies SI/HI/AVH  Suicidal Thoughts:  No  Homicidal Thoughts:  No  Judgement:  Fair  Insight:  Lacking  Psychomotor Activity:  Normal  Akathisia:  No  Fund of Knowledge:  Fair      Assets:  Communication Skills Desire for Improvement Housing Social Support Talents/Skills  Cognition:  Impaired,  Mild  ADL's:  Impaired  AIMS (if indicated):        Other History   These have been pulled in through the EMR, reviewed, and updated if appropriate.  Family History:  The patient's family history includes Bone cancer in her brother; COPD in her sister; Heart attack in her father; Lung cancer in her mother.  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6 AP% 16.0 CDE 5.86 Fluid Pack lot # [id number]   COCCYX REMOVAL     colonoscopy  2005   Dr. Golda: mild melanosis coli, otherwise normal   COLONOSCOPY WITH PROPOFOL  N/A 09/14/2013   MFM:Fzojwndpd coli. Colonic diverticulosis. Single colonic. Tubular adenoma. Next TCS 09/2020.   COLONOSCOPY WITH PROPOFOL  N/A 07/24/2021   Procedure: COLONOSCOPY WITH PROPOFOL ;  Surgeon: Shaaron Lamar HERO, MD;  [facility]: [facility];  Service: Endoscopy;  Laterality: N/A;  2:00pm   ERCP  1997   [facility]: biliary manometry abnormal, subsequent sphincterotomy    ESOPHAGEAL MANOMETRY N/A 12/21/2016   Surgeon: Shila Gustav GAILS, MD; EG junction outflow obstruction which could be secondary to large hiatal hernia and possible coiling of catheter.  Not consistent with achalasia or other variant.  No major peristaltic motility disorder.   ESOPHAGOGASTRODUODENOSCOPY (EGD) WITH PROPOFOL  N/A  09/14/2013   MFM:Dryjusxp'd ring. Hiatal hernia. Status post Agapito and biopsy disruption.    FOOT SURGERY Bilateral    Bunionectomy and hammer toes   HEMORROIDECTOMY     HERNIA REPAIR     inguinal right   LAPAROSCOPIC PARAESOPHAGEAL HERNIA REPAIR  02/2017   Dr. [facility])   OREGON DILATION N/A 09/14/2013   Procedure: AGAPITO HODGKIN;  Surgeon: Lamar HERO Shaaron, MD;  Location: [facility];  Service: Endoscopy;  Laterality: N/A;  56   POLYPECTOMY N/A 09/14/2013   Procedure: POLYPECTOMY;  Surgeon: Lamar HERO Shaaron, MD;  Location: [facility] ORS;  Service: Endoscopy;  Laterality: N/A;   POLYPECTOMY  07/24/2021   Procedure: POLYPECTOMY;  Surgeon: Shaaron Lamar HERO, MD;  Location: [facility];  Service: Endoscopy;;   TONSILLECTOMY     XI ROBOT ASSISTED RECTOPEXY N/A 02/24/2023   Procedure: XI ROBOT ASSISTED RECTOPEXY;  Surgeon: Debby Hila, MD;  Location: [facility] OR[facility];  Service: General;  Laterality: N/A;     Medications:   Current Facility-Administered Medications:    acetaminophen  (TYLENOL ) tablet 650 mg, 650 mg, Oral, Q6H PRN, 650 mg at 11/07/23 0329 **OR** acetaminophen  (TYLENOL ) suppository 650 mg, 650 mg, Rectal, Q6H PRN, Emokpae, Ejiroghene E, MD   acetaminophen  (TYLENOL ) tablet 1,000 mg, 1,000 mg, Oral, Q8H, Tat, Alm, MD, 1,000 mg at 11/14/23 0536   enoxaparin  (LOVENOX ) injection 40 mg, 40 mg, Subcutaneous, Q24H, Emokpae, Ejiroghene E, MD, 40 mg at 11/13/23 2204   feeding supplement (ENSURE PLUS HIGH PROTEIN) liquid 237 mL, 237 mL, Oral, BID BM PRN, Laurence Locus, DO   FLUoxetine  (PROZAC ) capsule 40 mg, 40 mg, Oral, Daily, Johnson, Clanford L, MD, 40 mg at 11/14/23 9060   fluticasone  furoate-vilanterol (BREO ELLIPTA ) 200-25 MCG/ACT 1 puff, 1 puff, Inhalation, Daily, Emokpae, Ejiroghene E, MD, 1 puff at 11/14/23 0837   gabapentin  (NEURONTIN ) capsule 600 mg, 600 mg, Oral, TID, Pokhrel, Laxman, MD, 600 mg at 11/14/23 0940   glucagon (human recombinant) (GLUCAGEN) injection 1 mg, 1 mg,  Intravenous, PRN, Amin, Ankit C, MD   guaiFENesin  (ROBITUSSIN) 100 MG/5ML liquid 5 mL, 5 mL, Oral, Q4H PRN, Amin, Ankit C, MD   haloperidol  lactate (HALDOL ) injection 2 mg, 2 mg, Intravenous, Q6H PRN, Johnson, Clanford L, MD   hydrALAZINE  (APRESOLINE ) injection 10 mg, 10 mg, Intravenous, Q4H PRN, Amin, Ankit C, MD   ipratropium-albuterol  (DUONEB) 0.5-2.5 (3) MG/3ML nebulizer solution 3 mL, 3 mL, Nebulization, Q4H PRN, Amin, Ankit C, MD   ketorolac  (TORADOL ) 15 MG/ML injection 15 mg, 15 mg, Intravenous, Q6H PRN, Pokhrel, Laxman, MD, 15 mg at 11/13/23 1208   lamoTRIgine  (LAMICTAL ) tablet 25 mg, 25 mg, Oral, BID, Johnson, Clanford L, MD, 25 mg at 11/14/23 0940   latanoprost  (XALATAN ) 0.005 % ophthalmic solution 1 drop, 1 drop, Both Eyes, QHS, Johnson, Clanford L, MD, 1 drop at 11/13/23 2207   levothyroxine  (SYNTHROID ) tablet 25 mcg, 25 mcg, Oral, QAC breakfast, Emokpae, Ejiroghene E, MD, 25 mcg at 11/14/23 0537   lidocaine  (LIDODERM ) 5 % 1 patch, 1 patch, Transdermal, Q24H, Tat, Alm, MD, 1 patch at 11/13/23 2200   LORazepam  (ATIVAN ) tablet 0.5 mg, 0.5 mg, Oral, Q4H PRN, Vicci, Clanford L, MD, 0.5 mg at 11/13/23 1227   losartan  (COZAAR ) tablet 25 mg, 25 mg, Oral, Daily, Amin, Ankit C, MD, 25 mg at 11/14/23 0941   melatonin tablet 3 mg, 3 mg, Oral, QHS, Johnson, Clanford L, MD, 3 mg at 11/13/23 2157   metoprolol  tartrate (LOPRESSOR ) injection 5 mg, 5 mg, Intravenous, Q4H PRN, Amin, Ankit C, MD   metoprolol  tartrate (LOPRESSOR ) tablet 12.5 mg, 12.5 mg, Oral, BID, Emokpae, Ejiroghene E, MD, 12.5 mg at 11/14/23 0941   multivitamin with minerals tablet 1 tablet, 1 tablet, Oral, Daily, Tat, David, MD, 1 tablet at 11/14/23 0941   ondansetron  (ZOFRAN ) tablet 4 mg, 4 mg, Oral, Q6H PRN **OR** ondansetron  (ZOFRAN ) injection 4 mg, 4 mg, Intravenous, Q6H PRN, Emokpae, Ejiroghene E, MD   pantoprazole  (PROTONIX ) EC tablet 40 mg, 40 mg, Oral, Daily, Madueme, Elvira C, RPH, 40 mg at 11/14/23 0941   polyethylene  glycol (MIRALAX  / GLYCOLAX ) packet 17 g, 17 g, Oral, Daily PRN, Emokpae, Ejiroghene E, MD  Allergies: Allergies  Allergen Reactions  Sulfa Antibiotics Rash and Hives   Dog Epithelium (Canis Lupus Familiaris) Other (See Comments)   Mirtazapine Other (See Comments)    HA's   Trazodone Other (See Comments)   Trazodone Hcl Other (See Comments)    strange dreams   Ziprasidone Other (See Comments)    cardiac issues   Aripiprazole Other (See Comments)    Bad dreams   Quetiapine  Palpitations   Trazodone And Nefazodone Other (See Comments)    unknown   Ziprasidone Hcl Other (See Comments)    hospitalization specifics    Taim Wurm MOTLEY-MANGRUM, PMHNP

## 2023-11-14 NOTE — Progress Notes (Signed)
 PROGRESS NOTE  Heidi Fuentes FMW:[id number] DOB: November 05, 1945 DOA: 10/25/2023 PCP: [name], DO   LOS: 19 days   Brief narrative:  78 y.o. female with past medical history significant for asthma, bipolar disorder, hypertension, chronic pain/fibromyalgia, and bronchiectasis presented to the hospital with altered mental status.  She was also noted to be tachycardic and tachypneic.  Labs were notable for negative CT head scan.  Chest x-ray was negative.  MRI of the brain was negative as well.  UA not suggestive of infection. During early part of the hospitalization, the patient remained primarily agitated.  She refused to take her medications and would frequently spit them out.  Her oral intake was poor.  Her metabolic derangements were optimized.  Workup for reversible causes including serum B12, folic acid , TSH, and ammonia were largely unremarkable.  Most of her medications were discontinued as the patient was not appropriately taking that at home.  Subsequently patient was willing to take her medications.  She did have argumentativeness and social services was consulted to assist with guardianship.  Currently awaiting for safe disposition.  Assessment/Plan: Principal Problem:   Major neurocognitive disorder (HCC) Active Problems:   Acquired hypothyroidism   Bipolar affective disorder, depressed, mild (HCC)   Unable to make decisions about medical treatment due to impaired mental capacity  Acute metabolic/toxic encephalopathy with delirium  With possible progressive cognitive impairment/ polypharmacy.  Imaging and blood work are mostly negative.  Multiple psychiatry medications has been discontinued/adjusted.  Currently patient is on Prozac , Lamictal  and Haldol  as needed. Currently awaiting guardianship.  Sitter and security for agitation on 10/11.  Increased haldol  dose on 10/11 for PRN severe agitation only.  Added lorazepam  as needed for severe agitation only.  I requested a new TTS  eval on 10/11 but they did not see patient until 10/12. See new recommendations.    Bipolar disorder IRIS team saw the patient on 9/29, does not meet inpatient criteria.  awaiting SNF placement per therapy.  Continue Prozac  Lamictal  and Haldol .  TTS consult requested on 10/11 and seen patient on 10/12, see new recommendations.    Hypothyroidism Continue Synthroid .  Latest TSH of 1.4   Essential hypertension Continue metoprolol  and Cozaar    GERD Continue Protonix    COPD Continue bronchodilators   Chronic pain Not on oxycodone  anymore.  Will increase the dose of gabapentin  to her home dose from today.   Anxiety/depression Continue Lamictal  and Prozac    Physical deconditioning Continue physical therapy and recommended skilled nursing facility placement.  Hypokalemia Replenished and improved.  Latest potassium of 4.3.   DVT prophylaxis: enoxaparin  (LOVENOX ) injection 40 mg Start: 10/25/23 2030   Disposition: Skilled nursing facility.  Status is: Inpatient Remains inpatient appropriate because: Awaiting guardianship and placement   Code Status:     Code Status: Full Code  Family Communication: None at bedside  Consultants: TTS/IRIS  Procedures: None  Anti-infectives:  None  Anti-infectives (From admission, onward)    None      Subjective: Pt won't allow sitters in the room but will allow outside of door, she is eating and drinking.   Objective: Vitals:   11/14/23 0546 11/14/23 0838  BP: (!) 168/81   Pulse:    Resp: 20   Temp: 97.8 F (36.6 C)   SpO2: 99% 97%    Intake/Output Summary (Last 24 hours) at 11/14/2023 1444 Last data filed at 11/14/2023 1336 Gross per 24 hour  Intake 360 ml  Output --  Net 360 ml   Heidi Fuentes  Weights   11/11/23 0520 11/13/23 0525 11/14/23 0536  Weight: 51.1 kg 50.1 kg 51.6 kg   Body mass index is 22.98 kg/m.   Physical Exam:  GENERAL: pt is much less aggressive and less agitated. HENT: No scleral pallor or  icterus. Pupils equally reactive to light. Oral mucosa is moist NECK: is supple, no gross swelling noted. CHEST: Clear to auscultation. No crackles or wheezes.   CVS: S1 and S2 heard, no murmur. Regular rate and rhythm.  ABDOMEN: Soft, non-tender, nondistended, no masses, normal BS heard. EXTREMITIES: No C/C/edema. CNS: Cranial nerves are intact. No focal motor deficits. SKIN: warm and dry without rashes. Psych: Pt is delirious.   Data Review: I have personally reviewed the following laboratory data and studies,  CBC: Recent Labs  Lab 11/10/23 0016 11/12/23 2342  WBC 10.0 9.2  HGB 12.8 11.3*  HCT 39.9 34.2*  MCV 100.0 99.1  PLT 223 188   Basic Metabolic Panel: Recent Labs  Lab 11/10/23 0016 11/12/23 2342  NA 138 136  K 4.3 4.3  CL 101 101  CO2 25 26  GLUCOSE 102* 115*  BUN 16 25*  CREATININE 0.68 0.77  CALCIUM  9.3 9.2  MG 2.5* 2.5*  PHOS 4.3  --    Liver Function Tests: No results for input(s): AST, ALT, ALKPHOS, BILITOT, PROT, ALBUMIN in the last 168 hours. No results for input(s): LIPASE, AMYLASE in the last 168 hours. Recent Labs  Lab 11/14/23 1121  AMMONIA 17   Cardiac Enzymes: No results for input(s): CKTOTAL, CKMB, CKMBINDEX, TROPONINI in the last 168 hours. BNP (last 3 results) No results for input(s): BNP in the last 8760 hours.  ProBNP (last 3 results) No results for input(s): PROBNP in the last 8760 hours.  CBG: No results for input(s): GLUCAP in the last 168 hours. No results found for this or any previous visit (from the past 240 hours).   Studies: No results found.  Afton Louder, MD  [facility] 11/14/2023  If 7PM-7AM, please contact night-coverage

## 2023-11-14 NOTE — Progress Notes (Signed)
 Patient rested well majority of this shift.  Patient was able to take scheduled medications without spitting them out this shift.  Patient is alert this morning and able to answer simple questions, but does have a lot of rambling and word salad this am.  Heidi Fuentes has been present at beside this shift.

## 2023-11-14 NOTE — Plan of Care (Signed)
   Problem: Education: Goal: Knowledge of General Education information will improve Description Including pain rating scale, medication(s)/side effects and non-pharmacologic comfort measures Outcome: Progressing   Problem: Health Behavior/Discharge Planning: Goal: Ability to manage health-related needs will improve Outcome: Progressing

## 2023-11-15 DIAGNOSIS — F0390 Unspecified dementia without behavioral disturbance: Secondary | ICD-10-CM | POA: Diagnosis not present

## 2023-11-15 DIAGNOSIS — F3131 Bipolar disorder, current episode depressed, mild: Secondary | ICD-10-CM | POA: Diagnosis not present

## 2023-11-15 DIAGNOSIS — G9341 Metabolic encephalopathy: Secondary | ICD-10-CM | POA: Diagnosis not present

## 2023-11-15 MED ORDER — FOSFOMYCIN TROMETHAMINE 3 G PO PACK
3.0000 g | PACK | Freq: Once | ORAL | Status: AC
  Administered 2023-11-15: 3 g via ORAL
  Filled 2023-11-15: qty 3

## 2023-11-15 NOTE — Progress Notes (Signed)
 Mobility Specialist Progress Note:    11/15/23 1000  Mobility  Activity Ambulated with assistance  Level of Assistance Standby assist, set-up cues, supervision of patient - no hands on  Assistive Device Other (Comment) (1 HHA)  Distance Ambulated (ft) 120 ft  Range of Motion/Exercises Active;All extremities  Activity Response Tolerated well  Mobility Referral Yes  Mobility visit 1 Mobility  Mobility Specialist Start Time (ACUTE ONLY) 1000  Mobility Specialist Stop Time (ACUTE ONLY) 1020  Mobility Specialist Time Calculation (min) (ACUTE ONLY) 20 min   Pt received in bed, agreeable to mobility. Required SBA to stand and ambulate with 1 hand-held assist. Tolerated well, confused. Returned supine, all needs met.  Abriella Filkins Mobility Specialist Please contact via Special educational needs teacher or  Rehab office at [phone]

## 2023-11-15 NOTE — Plan of Care (Signed)

## 2023-11-15 NOTE — TOC Progression Note (Signed)
 Transition of Care [facility]) - Progression Note    Patient Details  Name: Heidi Fuentes MRN: [id number] Date of Birth: 1945-11-28  Transition of Care [facility]) CM/SW Contact  Mcarthur Saddie Kim, [place] Phone Number: 11/15/2023, 9:05 AM  Clinical Narrative:  LCSW spoke with [facility] with [facility]. She reports pt's court date for protective order is scheduled for 10/23. LCSW reached out to sons, Manus and Dempsey to see if they would be willing to sign pt in to SNF while court date is pending. Manus and Dempsey state they are not agreeable. TOC will continue to follow.       Expected Discharge Plan: Skilled Nursing Facility Barriers to Discharge: Unsafe home situation, DSS Guardianship  DSS Guardianship request start date: 10/27/23 DSS Service county:  [place], transferred to [place] due to conflict of interest) DSS Date of decision notification: 10/28/23 DSS Guardianship decision: Substantiated      Expected Discharge Plan and Services In-house Referral: Clinical Social Work Discharge Planning Services: NA Post Acute Care Choice: Nursing Home, Skilled Nursing Facility Living arrangements for the past 2 months: Single Family Home                                       Social Drivers of Health (SDOH) Interventions SDOH Screenings   Food Insecurity: No Food Insecurity (10/25/2023)  Housing: Low Risk  (10/25/2023)  Transportation Needs: No Transportation Needs (10/25/2023)  Utilities: Not At Risk (10/25/2023)  Social Connections: Unknown (10/25/2023)  Tobacco Use: Low Risk  (10/25/2023)    Readmission Risk Interventions    11/03/2023   12:54 PM 10/29/2023   10:31 AM 10/28/2023   10:36 AM  Readmission Risk Prevention Plan  Transportation Screening Complete Complete Complete  PCP or Specialist Appt within 5-7 Days   Complete  Home Care Screening  Complete Complete  Medication Review (RN CM)  Complete Complete  HRI or Home Care Consult Complete    Social Work  Consult for Recovery Care Planning/Counseling Complete    Palliative Care Screening Not Applicable    Medication Review Oceanographer) Complete

## 2023-11-15 NOTE — Progress Notes (Signed)
 PROGRESS NOTE  Heidi Fuentes FMW:[id number] DOB: 12-11-1945 DOA: 10/25/2023 PCP: [name], DO   LOS: 20 days   Brief narrative:  78 y.o. female with past medical history significant for asthma, bipolar disorder, hypertension, chronic pain/fibromyalgia, and bronchiectasis presented to the hospital with altered mental status.  She was also noted to be tachycardic and tachypneic.  Labs were notable for negative CT head scan.  Chest x-ray was negative.  MRI of the brain was negative as well.  UA not suggestive of infection. During early part of the hospitalization, the patient remained primarily agitated.  She refused to take her medications and would frequently spit them out.  Her oral intake was poor.  Her metabolic derangements were optimized.  Workup for reversible causes including serum B12, folic acid , TSH, and ammonia were largely unremarkable.  Most of her medications were discontinued as the patient was not appropriately taking that at home.  Subsequently patient was willing to take her medications.  She did have argumentativeness and social services was consulted to assist with guardianship.  Currently awaiting for safe disposition.  Assessment/Plan: Principal Problem:   Major neurocognitive disorder (HCC) Active Problems:   Acquired hypothyroidism   Bipolar affective disorder, depressed, mild (HCC)   Unable to make decisions about medical treatment due to impaired mental capacity  Acute metabolic/toxic encephalopathy with delirium  Probable progressive cognitive impairment/ polypharmacy on admission.   Multiple psychiatry medications has been discontinued/adjusted.  Currently patient is on Prozac , Lamictal  and Haldol  and lorazepam  as needed. Currently awaiting guardianship.   Sitter and security for agitation as needed.  Increased haldol  dose on 10/11 for PRN severe agitation only.  Added lorazepam  as needed for severe agitation only.   I requested a new TTS eval on 10/11 but  they did not see patient until 10/12. See new recommendations.    Bipolar disorder IRIS team saw the patient on 9/29, does not meet inpatient criteria.  awaiting SNF placement per therapy.  Continue Prozac  Lamictal  and Haldol .  TTS consult requested on 10/11 and seen patient on 10/12, see new recommendations.    Hypothyroidism Continue Synthroid .  Latest TSH of 1.4   Essential hypertension Continue metoprolol  and Cozaar    GERD Continue Protonix    COPD Continue bronchodilators   Chronic pain Not on oxycodone  anymore.  Will increase the dose of gabapentin  to her home dose from today.   Anxiety/depression Continue Lamictal  and Prozac    Physical deconditioning Continue physical therapy and recommended skilled nursing facility placement.  Hypokalemia Replenished and improved.  Latest potassium of 4.3.   DVT prophylaxis: enoxaparin  (LOVENOX ) injection 40 mg Start: 10/25/23 2030  Disposition: Skilled nursing facility.  Status is: Inpatient Remains inpatient appropriate because: Awaiting guardianship and placement   Code Status:     Code Status: Full Code  Family Communication: None at bedside  Consultants: TTS/IRIS  Procedures: None  Anti-infectives:  None  Anti-infectives (From admission, onward)    Start     Dose/Rate Route Frequency Ordered Stop   11/15/23 1630  fosfomycin (MONUROL) packet 3 g        3 g Oral  Once 11/15/23 1530        Subjective: Pt agitated with anyone in the room but will allow a sitter at the door.  She is eating and drinking.    Objective: Vitals:   11/15/23 0738 11/15/23 1336  BP:  (!) 146/64  Pulse:  63  Resp:  18  Temp:  97.8 F (36.6 C)  SpO2: 97%  98%    Intake/Output Summary (Last 24 hours) at 11/15/2023 1553 Last data filed at 11/15/2023 1300 Gross per 24 hour  Intake 600 ml  Output --  Net 600 ml   Filed Weights   11/13/23 0525 11/14/23 0536 11/15/23 0622  Weight: 50.1 kg 51.6 kg 51.2 kg   Body mass index is  22.8 kg/m.   Physical Exam:  GENERAL: pt is much less aggressive and less agitated. Her overall mood is easily agitated; she has obvious signs of psychosis with word and phrase repetitions, disorganized thoughts and flight of ideas.  HENT: No scleral pallor or icterus. Pupils equally reactive to light. Oral mucosa is moist NECK: is supple, no gross swelling noted. CHEST: Clear to auscultation. No crackles or wheezes.   CVS: S1 and S2 heard, no murmur. Regular rate and rhythm.  ABDOMEN: Soft, non-tender, nondistended, no masses, normal BS heard. EXTREMITIES: No C/C/edema. CNS: Cranial nerves are intact. No focal motor deficits. SKIN: warm and dry without rashes. Psych: Pt is delirious.   Data Review: I have personally reviewed the following laboratory data and studies,  CBC: Recent Labs  Lab 11/10/23 0016 11/12/23 2342  WBC 10.0 9.2  HGB 12.8 11.3*  HCT 39.9 34.2*  MCV 100.0 99.1  PLT 223 188   Basic Metabolic Panel: Recent Labs  Lab 11/10/23 0016 11/12/23 2342  NA 138 136  K 4.3 4.3  CL 101 101  CO2 25 26  GLUCOSE 102* 115*  BUN 16 25*  CREATININE 0.68 0.77  CALCIUM  9.3 9.2  MG 2.5* 2.5*  PHOS 4.3  --    Liver Function Tests: No results for input(s): AST, ALT, ALKPHOS, BILITOT, PROT, ALBUMIN in the last 168 hours. No results for input(s): LIPASE, AMYLASE in the last 168 hours. Recent Labs  Lab 11/14/23 1121  AMMONIA 17   Cardiac Enzymes: No results for input(s): CKTOTAL, CKMB, CKMBINDEX, TROPONINI in the last 168 hours. BNP (last 3 results) No results for input(s): BNP in the last 8760 hours.  ProBNP (last 3 results) No results for input(s): PROBNP in the last 8760 hours.  CBG: No results for input(s): GLUCAP in the last 168 hours. No results found for this or any previous visit (from the past 240 hours).   Studies: No results found.  Afton Louder, MD  [facility] 11/15/2023  If 7PM-7AM, please contact  night-coverage

## 2023-11-15 NOTE — Plan of Care (Signed)

## 2023-11-16 DIAGNOSIS — F0390 Unspecified dementia without behavioral disturbance: Secondary | ICD-10-CM | POA: Diagnosis not present

## 2023-11-16 DIAGNOSIS — F3131 Bipolar disorder, current episode depressed, mild: Secondary | ICD-10-CM | POA: Diagnosis not present

## 2023-11-16 DIAGNOSIS — G9341 Metabolic encephalopathy: Secondary | ICD-10-CM | POA: Diagnosis not present

## 2023-11-16 LAB — CBC
HCT: 36.8 % (ref 36.0–46.0)
Hemoglobin: 12 g/dL (ref 12.0–15.0)
MCH: 32.3 pg (ref 26.0–34.0)
MCHC: 32.6 g/dL (ref 30.0–36.0)
MCV: 99.2 fL (ref 80.0–100.0)
Platelets: 211 K/uL (ref 150–400)
RBC: 3.71 MIL/uL — ABNORMAL LOW (ref 3.87–5.11)
RDW: 12.1 % (ref 11.5–15.5)
WBC: 8.3 K/uL (ref 4.0–10.5)
nRBC: 0 % (ref 0.0–0.2)

## 2023-11-16 LAB — BASIC METABOLIC PANEL WITH GFR
Anion gap: 8 (ref 5–15)
BUN: 26 mg/dL — ABNORMAL HIGH (ref 8–23)
CO2: 28 mmol/L (ref 22–32)
Calcium: 9.4 mg/dL (ref 8.9–10.3)
Chloride: 100 mmol/L (ref 98–111)
Creatinine, Ser: 0.72 mg/dL (ref 0.44–1.00)
GFR, Estimated: >60 mL/min (ref >60)
Glucose, Bld: 105 mg/dL — ABNORMAL HIGH (ref 70–99)
Potassium: 4.4 mmol/L (ref 3.5–5.1)
Sodium: 136 mmol/L (ref 135–145)

## 2023-11-16 LAB — MAGNESIUM: Magnesium: 2.4 mg/dL (ref 1.7–2.4)

## 2023-11-16 MED ORDER — RISPERIDONE 0.5 MG PO TABS
0.2500 mg | ORAL_TABLET | Freq: Two times a day (BID) | ORAL | Status: DC
  Administered 2023-11-16 – 2023-11-18 (×6): 0.25 mg via ORAL
  Filled 2023-11-16 (×6): qty 1

## 2023-11-16 MED ORDER — KETOROLAC TROMETHAMINE 15 MG/ML IJ SOLN
15.0000 mg | Freq: Once | INTRAMUSCULAR | Status: AC
  Administered 2023-11-16: 15 mg via INTRAVENOUS
  Filled 2023-11-16: qty 1

## 2023-11-16 MED ORDER — OXYCODONE HCL 5 MG PO TABS
2.5000 mg | ORAL_TABLET | Freq: Four times a day (QID) | ORAL | Status: DC | PRN
  Administered 2023-11-18 – 2023-11-25 (×6): 2.5 mg via ORAL
  Filled 2023-11-16 (×6): qty 1

## 2023-11-16 NOTE — Plan of Care (Signed)

## 2023-11-16 NOTE — Progress Notes (Signed)
 Heidi Fuentes has requested to speak to the chaplin I contacted Rev. Tanda this afternoon to let him know that she is wanting to speak to someone.

## 2023-11-16 NOTE — Progress Notes (Signed)
 PROGRESS NOTE  Heidi Fuentes FMW:[id number] DOB: 28-Jul-1945 DOA: 10/25/2023 PCP: [name], DO   LOS: 21 days   Brief narrative:  78 y.o. female with past medical history significant for asthma, bipolar disorder, hypertension, chronic pain/fibromyalgia, and bronchiectasis presented to the hospital with altered mental status.  She was also noted to be tachycardic and tachypneic.  Labs were notable for negative CT head scan.  Chest x-ray was negative.  MRI of the brain was negative as well.  UA not suggestive of infection. During early part of the hospitalization, the patient remained primarily agitated.  She refused to take her medications and would frequently spit them out.  Her oral intake was poor.  Her metabolic derangements were optimized.  Workup for reversible causes including serum B12, folic acid , TSH, and ammonia were largely unremarkable.  Most of her medications were discontinued as the patient was not appropriately taking that at home.  Subsequently patient was willing to take her medications.  She did have argumentativeness and social services was consulted to assist with guardianship.  Currently awaiting for safe disposition.  Assessment/Plan: Principal Problem:   Major neurocognitive disorder (HCC) Active Problems:   Acquired hypothyroidism   Bipolar affective disorder, depressed, mild (HCC)   Unable to make decisions about medical treatment due to impaired mental capacity  Acute metabolic/toxic encephalopathy with delirium  Probable progressive cognitive impairment/ polypharmacy on admission.   Multiple psychiatry medications has been discontinued/adjusted.  Currently patient is on Prozac , Lamictal  and Haldol  and lorazepam  as needed. Currently awaiting guardianship.   Sitter and security for agitation as needed.  Increased haldol  dose on 10/11 for PRN severe agitation only.  Added lorazepam  as needed for severe agitation only.   I requested a new TTS eval on 10/11 but  they did not see patient until 10/12. See new recommendations.    Bipolar disorder IRIS team saw the patient on 9/29, does not meet inpatient criteria.  awaiting SNF placement per therapy.  Haldol  and lorazepam  only to be used for severe agitation only.   TTS consult requested on 10/11 and seen patient on 10/12, see new recommendations.  Increased dose of risperdal  per recommendations to 0.25 mg BID on 10/14, and in the next 24-48 hours would titrate dose further to goal of 0.5 mg BID per psychiatrist recommendations Continue lamictal  25 mg BID per psychiatrist recommendation   Hypothyroidism Continue Synthroid .  Latest TSH of 1.4   Essential hypertension Continue metoprolol  and Cozaar   Abnormal UA Question of UTI Pt is too mentally ill to get an accurate history regarding dysuria symptoms, therefore we treated with a dose of fosfomycin on 11/15/23   GERD Continue Protonix    COPD Continue bronchodilators   Chronic low back pain Pain management ordered with 1 time dose of ketorolac  given this morning, continue lidocaine  patch   Anxiety/depression Continue Lamictal  and Prozac    Physical deconditioning Continue physical therapy and recommended skilled nursing facility placement.  Hypokalemia Replenished and improved.  Latest potassium of 4.3.   DVT prophylaxis: enoxaparin  (LOVENOX ) injection 40 mg Start: 10/25/23 2030  Disposition: Skilled nursing facility.  Status is: Inpatient Remains inpatient appropriate because: Awaiting guardianship and placement   Code Status:     Code Status: Full Code  Family Communication: None at bedside  Consultants: TTS/IRIS  Procedures: None  Anti-infectives:  None  Anti-infectives (From admission, onward)    Start     Dose/Rate Route Frequency Ordered Stop   11/15/23 1630  fosfomycin (MONUROL) packet 3 g  3 g Oral  Once 11/15/23 1530 11/15/23 1654      Subjective: Pt complains of low back pain.      Objective: Vitals:   11/16/23 0819 11/16/23 0950  BP:  130/60  Pulse:  60  Resp:    Temp:    SpO2: 97%     Intake/Output Summary (Last 24 hours) at 11/16/2023 1105 Last data filed at 11/15/2023 1300 Gross per 24 hour  Intake 240 ml  Output --  Net 240 ml   Filed Weights   11/14/23 0536 11/15/23 0622 11/16/23 0515  Weight: 51.6 kg 51.2 kg 51.2 kg   Body mass index is 22.8 kg/m.   Physical Exam:  GENERAL: pt is much less aggressive and less agitated. Her overall mood is easily agitated; she has obvious signs of psychosis with word and phrase repetitions, disorganized thoughts and flight of ideas.  HENT: No scleral pallor or icterus. Pupils equally reactive to light. Oral mucosa is moist NECK: is supple, no gross swelling noted. CHEST: Clear to auscultation. No crackles or wheezes.   CVS: S1 and S2 heard, no murmur. Regular rate and rhythm.  ABDOMEN: Soft, non-tender, nondistended, no masses, normal BS heard. EXTREMITIES: No C/C/edema. CNS: Cranial nerves are intact. No focal motor deficits. SKIN: warm and dry without rashes. Psych: Pt remains delirious.   Data Review: I have personally reviewed the following laboratory data and studies,  CBC: Recent Labs  Lab 11/10/23 0016 11/12/23 2342 11/16/23 0028  WBC 10.0 9.2 8.3  HGB 12.8 11.3* 12.0  HCT 39.9 34.2* 36.8  MCV 100.0 99.1 99.2  PLT 223 188 211   Basic Metabolic Panel: Recent Labs  Lab 11/10/23 0016 11/12/23 2342 11/16/23 0028  NA 138 136 136  K 4.3 4.3 4.4  CL 101 101 100  CO2 25 26 28   GLUCOSE 102* 115* 105*  BUN 16 25* 26*  CREATININE 0.68 0.77 0.72  CALCIUM  9.3 9.2 9.4  MG 2.5* 2.5* 2.4  PHOS 4.3  --   --    Liver Function Tests: No results for input(s): AST, ALT, ALKPHOS, BILITOT, PROT, ALBUMIN in the last 168 hours. No results for input(s): LIPASE, AMYLASE in the last 168 hours. Recent Labs  Lab 11/14/23 1121  AMMONIA 17   Cardiac Enzymes: No results for input(s):  CKTOTAL, CKMB, CKMBINDEX, TROPONINI in the last 168 hours. BNP (last 3 results) No results for input(s): BNP in the last 8760 hours.  ProBNP (last 3 results) No results for input(s): PROBNP in the last 8760 hours.  CBG: No results for input(s): GLUCAP in the last 168 hours. No results found for this or any previous visit (from the past 240 hours).   Studies: No results found.  Afton Louder, MD  [facility] 11/16/2023  If 7PM-7AM, please contact night-coverage

## 2023-11-16 NOTE — Plan of Care (Signed)

## 2023-11-16 NOTE — Progress Notes (Signed)
 Mobility Specialist Progress Note:    11/16/23 1000  Mobility  Activity Ambulated with assistance  Level of Assistance Standby assist, set-up cues, supervision of patient - no hands on  Assistive Device Other (Comment) (1 HHA)  Distance Ambulated (ft) 200 ft  Range of Motion/Exercises Active;All extremities  Activity Response Tolerated well  Mobility Referral Yes  Mobility visit 1 Mobility  Mobility Specialist Start Time (ACUTE ONLY) 1000  Mobility Specialist Stop Time (ACUTE ONLY) 1020  Mobility Specialist Time Calculation (min) (ACUTE ONLY) 20 min   Pt received in bed, agreeable to mobility. Required SBA to stand and ambulate with 1 hand-held assist. Tolerated well, confused. Returned supine, all needs met.  Coalton Arch Mobility Specialist Please contact via Special educational needs teacher or  Rehab office at [phone]

## 2023-11-17 DIAGNOSIS — F0390 Unspecified dementia without behavioral disturbance: Secondary | ICD-10-CM | POA: Diagnosis not present

## 2023-11-17 NOTE — Progress Notes (Signed)
 PROGRESS NOTE    Heidi Fuentes  FMW:[id number] DOB: 1945/12/16 DOA: 10/25/2023 PCP: [name], DO    Brief Narrative:    78 y.o. female with past medical history significant for asthma, bipolar disorder, hypertension, chronic pain/fibromyalgia, and bronchiectasis presented to the hospital with altered mental status.  She was also noted to be tachycardic and tachypneic.  Labs were notable for negative CT head scan.  Chest x-ray was negative.  MRI of the brain was negative as well.  UA not suggestive of infection. During early part of the hospitalization, the patient remained primarily agitated.  She refused to take her medications and would frequently spit them out.  Her oral intake was poor.  Her metabolic derangements were optimized.  Workup for reversible causes including serum B12, folic acid , TSH, and ammonia were largely unremarkable.  Most of her medications were discontinued as the patient was not appropriately taking that at home.  Subsequently patient was willing to take her medications.  She did have argumentativeness and social services was consulted to assist with guardianship.  Currently awaiting for safe disposition.    Assessment & Plan:    Acute metabolic/toxic encephalopathy with delirium  Probable progressive cognitive impairment/ polypharmacy on admission.   Multiple psychiatry medications has been discontinued/adjusted.  Currently patient is on Prozac , Lamictal  and Haldol  and lorazepam  as needed. Currently awaiting guardianship.   Sitter and security for agitation as needed.  Increased haldol  dose on 10/11 for PRN severe agitation only.  Added lorazepam  as needed for severe agitation only.   I requested a new TTS eval on 10/11 but they did not see patient until 10/12. See new recommendations.    Bipolar disorder IRIS team saw the patient on 9/29, does not meet inpatient criteria.  awaiting SNF placement per therapy.  Haldol  and lorazepam  only to be used for severe  agitation only.   TTS consult requested on 10/11 and seen patient on 10/12, see new recommendations.  Increased dose of risperdal  per recommendations to 0.25 mg BID on 10/14, and in the next 24-48 hours would titrate dose further to goal of 0.5 mg BID per psychiatrist recommendations Continue lamictal  25 mg BID per psychiatrist recommendation   Hypothyroidism Continue Synthroid .  Latest TSH of 1.4   Essential hypertension Continue metoprolol  and Cozaar    Abnormal UA Question of UTI Pt is too mentally ill to get an accurate history regarding dysuria symptoms, therefore we treated with a dose of fosfomycin on 11/15/23   GERD Continue Protonix    COPD Continue bronchodilators   Chronic low back pain Pain management ordered with 1 time dose of ketorolac  given this morning, continue lidocaine  patch   Anxiety/depression Continue Lamictal  and Prozac    Physical deconditioning Continue physical therapy and recommended skilled nursing facility placement.   Hypokalemia Replenished and improved.  Latest potassium of 4.3.        DVT prophylaxis: enoxaparin  (LOVENOX ) injection 40 mg Start: 10/25/23 2030  Disposition: Skilled nursing facility.  Status is: Inpatient Remains inpatient appropriate because: Awaiting guardianship and placement  Code Status:    Code Status: Full Code  Family Communication: None at bedside Status is: Inpatient Remains inpatient appropriate because: Pending safe dispo   PT Follow up Recs: Follow Physician's Recommendations For Discharge Plan And Follow Up Therapies10/08/2023 0903  Subjective: No complaints.    Examination:  General exam: Appears calm and comfortable  Respiratory system: Clear to auscultation. Respiratory effort normal. Cardiovascular system: S1 & S2 heard, RRR. No JVD, murmurs, rubs, gallops or  clicks. No pedal edema. Gastrointestinal system: Abdomen is nondistended, soft and nontender. No organomegaly or masses felt. Normal bowel  sounds heard. Central nervous system: Alert and oriented to name only Extremities: Symmetric 5 x 5 power. Skin: No rashes, lesions or ulcers Psychiatry: Judgement and insight appear poor                Diet Orders (From admission, onward)     Start     Ordered   10/27/23 1501  DIET DYS 2 Room service appropriate? Yes; Fluid consistency: Thin  Diet effective now       Comments: Meds ok whole with liquids  Question Answer Comment  Room service appropriate? Yes   Fluid consistency: Thin      10/27/23 1500            Objective: Vitals:   11/16/23 1942 11/17/23 0557 11/17/23 0603 11/17/23 0743  BP: 132/64 (!) 166/68    Pulse: 66 (!) 54    Resp: 20 18    Temp: (!) 97.5 F (36.4 C) (!) 97.5 F (36.4 C)    TempSrc: Oral     SpO2: 100% 98%  98%  Weight:   51.9 kg   Height:        Intake/Output Summary (Last 24 hours) at 11/17/2023 1206 Last data filed at 11/17/2023 0900 Gross per 24 hour  Intake 480 ml  Output --  Net 480 ml   Filed Weights   11/15/23 0622 11/16/23 0515 11/17/23 0603  Weight: 51.2 kg 51.2 kg 51.9 kg    Scheduled Meds:  acetaminophen   1,000 mg Oral Q8H   enoxaparin  (LOVENOX ) injection  40 mg Subcutaneous Q24H   FLUoxetine   40 mg Oral Daily   fluticasone  furoate-vilanterol  1 puff Inhalation Daily   gabapentin   600 mg Oral TID   lamoTRIgine   25 mg Oral BID   latanoprost   1 drop Both Eyes QHS   levothyroxine   25 mcg Oral QAC breakfast   lidocaine   1 patch Transdermal Q24H   losartan   25 mg Oral Daily   melatonin  3 mg Oral QHS   metoprolol  tartrate  12.5 mg Oral BID   multivitamin with minerals  1 tablet Oral Daily   pantoprazole   40 mg Oral Daily   risperiDONE   0.25 mg Oral BID   Continuous Infusions:  Nutritional status Signs/Symptoms: per patient/family report Interventions: Ensure Enlive (each supplement provides 350kcal and 20 grams of protein), Magic cup, MVI, Prostat Body mass index is 23.11 kg/m.  Data Reviewed:    CBC: Recent Labs  Lab 11/12/23 2342 11/16/23 0028  WBC 9.2 8.3  HGB 11.3* 12.0  HCT 34.2* 36.8  MCV 99.1 99.2  PLT 188 211   Basic Metabolic Panel: Recent Labs  Lab 11/12/23 2342 11/16/23 0028  NA 136 136  K 4.3 4.4  CL 101 100  CO2 26 28  GLUCOSE 115* 105*  BUN 25* 26*  CREATININE 0.77 0.72  CALCIUM  9.2 9.4  MG 2.5* 2.4   GFR: Estimated Creatinine Clearance: 42.7 mL/min (by C-G formula based on SCr of 0.72 mg/dL). Liver Function Tests: No results for input(s): AST, ALT, ALKPHOS, BILITOT, PROT, ALBUMIN in the last 168 hours. No results for input(s): LIPASE, AMYLASE in the last 168 hours. Recent Labs  Lab 11/14/23 1121  AMMONIA 17   Coagulation Profile: No results for input(s): INR, PROTIME in the last 168 hours. Cardiac Enzymes: No results for input(s): CKTOTAL, CKMB, CKMBINDEX, TROPONINI in the last 168 hours. BNP (  last 3 results) No results for input(s): PROBNP in the last 8760 hours. HbA1C: No results for input(s): HGBA1C in the last 72 hours. CBG: No results for input(s): GLUCAP in the last 168 hours. Lipid Profile: No results for input(s): CHOL, HDL, LDLCALC, TRIG, CHOLHDL, LDLDIRECT in the last 72 hours. Thyroid  Function Tests: No results for input(s): TSH, T4TOTAL, FREET4, T3FREE, THYROIDAB in the last 72 hours. Anemia Panel: No results for input(s): VITAMINB12, FOLATE, FERRITIN, TIBC, IRON, RETICCTPCT in the last 72 hours. Sepsis Labs: No results for input(s): PROCALCITON, LATICACIDVEN in the last 168 hours.  No results found for this or any previous visit (from the past 240 hours).       Radiology Studies: No results found.         LOS: 22 days   Time spent= 35 mins    [place] JAYSON Dare, MD [facility]  If 7PM-7AM, please contact night-coverage  11/17/2023, 12:06 PM

## 2023-11-17 NOTE — Plan of Care (Signed)

## 2023-11-17 NOTE — Progress Notes (Signed)
 Mobility Specialist Progress Note:    11/17/23 1345  Mobility  Activity Ambulated with assistance  Level of Assistance Standby assist, set-up cues, supervision of patient - no hands on  Assistive Device Other (Comment) (1 HHA)  Distance Ambulated (ft) 300 ft  Range of Motion/Exercises Active;All extremities  Activity Response Tolerated well  Mobility Referral Yes  Mobility visit 1 Mobility  Mobility Specialist Start Time (ACUTE ONLY) 1345  Mobility Specialist Stop Time (ACUTE ONLY) 1417  Mobility Specialist Time Calculation (min) (ACUTE ONLY) 32 min   Pt received in bed, requesting to go to the gift shop. Required SBA to stand and ambulate with 1 hand-held assist. Tolerated well, legs became weak near EOS. Returned supine, all needs met.  Heidi Fuentes Mobility Specialist Please contact via Special educational needs teacher or  Rehab office at [phone]

## 2023-11-18 DIAGNOSIS — F0390 Unspecified dementia without behavioral disturbance: Secondary | ICD-10-CM | POA: Diagnosis not present

## 2023-11-18 MED ORDER — ZINC OXIDE 40 % EX OINT
TOPICAL_OINTMENT | Freq: Three times a day (TID) | CUTANEOUS | Status: AC
Start: 2023-11-18 — End: 2023-11-23
  Administered 2023-11-19 – 2023-11-20 (×2): 1 via TOPICAL
  Filled 2023-11-18 (×2): qty 57

## 2023-11-18 NOTE — Plan of Care (Signed)

## 2023-11-18 NOTE — Plan of Care (Signed)
  Problem: Education: Goal: Knowledge of General Education information will improve Description: Including pain rating scale, medication(s)/side effects and non-pharmacologic comfort measures Outcome: Not Progressing   Problem: Health Behavior/Discharge Planning: Goal: Ability to manage health-related needs will improve Outcome: Not Progressing   Problem: Clinical Measurements: Goal: Ability to maintain clinical measurements within normal limits will improve Outcome: Not Progressing Goal: Will remain free from infection Outcome: Not Progressing Goal: Diagnostic test results will improve Outcome: Not Progressing Goal: Respiratory complications will improve Outcome: Not Progressing Goal: Cardiovascular complication will be avoided Outcome: Not Progressing   Problem: Activity: Goal: Risk for activity intolerance will decrease Outcome: Not Progressing   Problem: Nutrition: Goal: Adequate nutrition will be maintained Outcome: Not Progressing   Problem: Coping: Goal: Level of anxiety will decrease Outcome: Not Progressing   Problem: Pain Managment: Goal: General experience of comfort will improve and/or be controlled Outcome: Not Progressing   Problem: Safety: Goal: Ability to remain free from injury will improve Outcome: Not Progressing   Problem: Skin Integrity: Goal: Risk for impaired skin integrity will decrease Outcome: Not Progressing

## 2023-11-18 NOTE — Progress Notes (Signed)
 PROGRESS NOTE    Heidi Fuentes  FMW:[id number] DOB: April 28, 1945 DOA: 10/25/2023 PCP: [name], DO    Brief Narrative:    78 y.o. female with past medical history significant for asthma, bipolar disorder, hypertension, chronic pain/fibromyalgia, and bronchiectasis presented to the hospital with altered mental status.  She was also noted to be tachycardic and tachypneic.  Labs were notable for negative CT head scan.  Chest x-ray was negative.  MRI of the brain was negative as well.  UA not suggestive of infection. During early part of the hospitalization, the patient remained primarily agitated.  She refused to take her medications and would frequently spit them out.  Her oral intake was poor.  Her metabolic derangements were optimized.  Workup for reversible causes including serum B12, folic acid , TSH, and ammonia were largely unremarkable.  Most of her medications were discontinued as the patient was not appropriately taking that at home.  Subsequently patient was willing to take her medications.  She did have argumentativeness and social services was consulted to assist with guardianship.  Currently awaiting for safe disposition.    Assessment & Plan:    Acute metabolic/toxic encephalopathy with delirium  Probable progressive cognitive impairment/ polypharmacy on admission.   Multiple psychiatry medications has been discontinued/adjusted.  Currently patient is on Prozac , Lamictal  and Haldol  and lorazepam  as needed. Currently awaiting guardianship.   Sitter and security for agitation as needed.  Increased haldol  dose on 10/11 for PRN severe agitation only.  Added lorazepam  as needed for severe agitation only.   I requested a new TTS eval on 10/11 but they did not see patient until 10/12. See new recommendations.    Bipolar disorder IRIS team saw the patient on 9/29, does not meet inpatient criteria.  awaiting SNF placement per therapy.  Haldol  and lorazepam  only to be used for severe  agitation only.   TTS consult requested on 10/11 and seen patient on 10/12, see new recommendations.  Increased dose of risperdal  per recommendations to 0.25 mg BID on 10/14, and in the next 24-48 hours would titrate dose further to goal of 0.5 mg BID per psychiatrist recommendations Continue lamictal  25 mg BID per psychiatrist recommendation   Hypothyroidism Continue Synthroid .  Latest TSH of 1.4   Essential hypertension Continue metoprolol  and Cozaar    Abnormal UA Question of UTI Pt is too mentally ill to get an accurate history regarding dysuria symptoms, therefore we treated with a dose of fosfomycin on 11/15/23   GERD Continue Protonix    COPD Continue bronchodilators   Chronic low back pain Pain management ordered with 1 time dose of ketorolac  given this morning, continue lidocaine  patch   Anxiety/depression Continue Lamictal  and Prozac    Physical deconditioning Continue physical therapy and recommended skilled nursing facility placement.   Hypokalemia Replenished and improved.  Latest potassium of 4.3.        DVT prophylaxis: enoxaparin  (LOVENOX ) injection 40 mg Start: 10/25/23 2030  Disposition: Skilled nursing facility.  Status is: Inpatient Remains inpatient appropriate because: Awaiting guardianship and placement  Code Status:    Code Status: Full Code  Family Communication: None at bedside Status is: Inpatient Remains inpatient appropriate because: Pending safe dispo   PT Follow up Recs: Follow Physician's Recommendations For Discharge Plan And Follow Up Therapies10/08/2023 0903  Subjective: No complaints.    Examination:  General exam: Appears calm and comfortable  Respiratory system: Clear to auscultation. Respiratory effort normal. Cardiovascular system: S1 & S2 heard, RRR. No JVD, murmurs, rubs, gallops or  clicks. No pedal edema. Gastrointestinal system: Abdomen is nondistended, soft and nontender. No organomegaly or masses felt. Normal bowel  sounds heard. Central nervous system: Alert and oriented to name only Extremities: Symmetric 5 x 5 power. Skin: No rashes, lesions or ulcers Psychiatry: Judgement and insight appear poor                Diet Orders (From admission, onward)     Start     Ordered   10/27/23 1501  DIET DYS 2 Room service appropriate? Yes; Fluid consistency: Thin  Diet effective now       Comments: Meds ok whole with liquids  Question Answer Comment  Room service appropriate? Yes   Fluid consistency: Thin      10/27/23 1500            Objective: Vitals:   11/17/23 1947 11/18/23 0445 11/18/23 0641 11/18/23 0826  BP: 99/74 (!) 149/68    Pulse: 70 (!) 57    Resp: 19 20    Temp: 97.8 F (36.6 C) 97.7 F (36.5 C)    TempSrc: Oral     SpO2: 96% 98%  98%  Weight:   51.6 kg   Height:       No intake or output data in the 24 hours ending 11/18/23 1133 Filed Weights   11/16/23 0515 11/17/23 0603 11/18/23 0641  Weight: 51.2 kg 51.9 kg 51.6 kg    Scheduled Meds:  acetaminophen   1,000 mg Oral Q8H   enoxaparin  (LOVENOX ) injection  40 mg Subcutaneous Q24H   FLUoxetine   40 mg Oral Daily   fluticasone  furoate-vilanterol  1 puff Inhalation Daily   gabapentin   600 mg Oral TID   lamoTRIgine   25 mg Oral BID   latanoprost   1 drop Both Eyes QHS   levothyroxine   25 mcg Oral QAC breakfast   lidocaine   1 patch Transdermal Q24H   losartan   25 mg Oral Daily   melatonin  3 mg Oral QHS   metoprolol  tartrate  12.5 mg Oral BID   multivitamin with minerals  1 tablet Oral Daily   pantoprazole   40 mg Oral Daily   risperiDONE   0.25 mg Oral BID   Continuous Infusions:  Nutritional status Signs/Symptoms: per patient/family report Interventions: Ensure Enlive (each supplement provides 350kcal and 20 grams of protein), Magic cup, MVI, Prostat Body mass index is 22.98 kg/m.  Data Reviewed:   CBC: Recent Labs  Lab 11/12/23 2342 11/16/23 0028  WBC 9.2 8.3  HGB 11.3* 12.0  HCT 34.2* 36.8  MCV  99.1 99.2  PLT 188 211   Basic Metabolic Panel: Recent Labs  Lab 11/12/23 2342 11/16/23 0028  NA 136 136  K 4.3 4.4  CL 101 100  CO2 26 28  GLUCOSE 115* 105*  BUN 25* 26*  CREATININE 0.77 0.72  CALCIUM  9.2 9.4  MG 2.5* 2.4   GFR: Estimated Creatinine Clearance: 39.5 mL/min (by C-G formula based on SCr of 0.72 mg/dL). Liver Function Tests: No results for input(s): AST, ALT, ALKPHOS, BILITOT, PROT, ALBUMIN in the last 168 hours. No results for input(s): LIPASE, AMYLASE in the last 168 hours. Recent Labs  Lab 11/14/23 1121  AMMONIA 17   Coagulation Profile: No results for input(s): INR, PROTIME in the last 168 hours. Cardiac Enzymes: No results for input(s): CKTOTAL, CKMB, CKMBINDEX, TROPONINI in the last 168 hours. BNP (last 3 results) No results for input(s): PROBNP in the last 8760 hours. HbA1C: No results for input(s): HGBA1C in the last  72 hours. CBG: No results for input(s): GLUCAP in the last 168 hours. Lipid Profile: No results for input(s): CHOL, HDL, LDLCALC, TRIG, CHOLHDL, LDLDIRECT in the last 72 hours. Thyroid  Function Tests: No results for input(s): TSH, T4TOTAL, FREET4, T3FREE, THYROIDAB in the last 72 hours. Anemia Panel: No results for input(s): VITAMINB12, FOLATE, FERRITIN, TIBC, IRON, RETICCTPCT in the last 72 hours. Sepsis Labs: No results for input(s): PROCALCITON, LATICACIDVEN in the last 168 hours.  No results found for this or any previous visit (from the past 240 hours).       Radiology Studies: No results found.         LOS: 23 days   Time spent= 35 mins    Burgess JAYSON Dare, MD [facility]  If 7PM-7AM, please contact night-coverage  11/18/2023, 11:33 AM

## 2023-11-19 DIAGNOSIS — F0390 Unspecified dementia without behavioral disturbance: Secondary | ICD-10-CM | POA: Diagnosis not present

## 2023-11-19 DIAGNOSIS — F3131 Bipolar disorder, current episode depressed, mild: Secondary | ICD-10-CM | POA: Diagnosis not present

## 2023-11-19 DIAGNOSIS — Z556 Problems related to health literacy: Secondary | ICD-10-CM | POA: Diagnosis not present

## 2023-11-19 DIAGNOSIS — E039 Hypothyroidism, unspecified: Secondary | ICD-10-CM | POA: Diagnosis not present

## 2023-11-19 MED ORDER — RISPERIDONE 0.5 MG PO TABS
0.5000 mg | ORAL_TABLET | Freq: Two times a day (BID) | ORAL | Status: DC
  Administered 2023-11-19 – 2023-11-26 (×12): 0.5 mg via ORAL
  Filled 2023-11-19 (×13): qty 1

## 2023-11-19 NOTE — Progress Notes (Signed)
 PROGRESS NOTE    Heidi Fuentes  FMW:[id number] DOB: 03-17-1945 DOA: 10/25/2023 PCP: [name], DO   Brief Hospital admission narrative: As per H&P written by Dr. Pearlean on 10/25/2023  Heidi Fuentes is a 78 y.o. female with medical history significant for asthma, bipolar disorder, hypertension, chronic pain, and bronchiectasis. Patient was brought to the ED via EMS for reports of altered mental status.  Patient is alone but has a caregiver, she called patient's son the patient was not her normal self, and he said to bring patient to the ED.  The time of my evaluation, patient is awake and alert and oriented to person, but not time, she is unable to tell me why she is here. She appears to answer other questions appropriately.  She denies difficulty breathing, no cough, no vomiting or diarrhea, no weakness of her extremities, endorses good oral intake.   She was hospitalized back in February- 2/1 to 2/6 also for altered mental status thought secondary to COVID infection and  hypoxia.   ED Course: Temperature 98.4.  Heart rate 92-118, respiratory rate 14-26.  Blood pressure systolic 131-156.  UA not suggestive of UTI.  Ammonia 18.  Potassium 3.3.  Head CT negative for acute abnormality.  Portable chest x-ray unremarkable. MRI ordered and pending  Assessment and plan: Acute metabolic/toxic encephalopathy with delirium  Probable progressive cognitive impairment/ polypharmacy on admission.   Multiple psychiatry medications has been discontinued/adjusted.  Currently patient is on Prozac , Lamictal  and Haldol  and lorazepam  as needed. Currently awaiting guardianship.   Sitter and security for agitation as needed.  Increased haldol  dose on 10/11 for PRN severe agitation only.  Added lorazepam  as needed for severe agitation only.   I requested a new TTS eval on 10/11 but they did not see patient until 10/12. See new recommendations.    Bipolar disorder IRIS team saw the patient on 9/29, does  not meet inpatient criteria.  awaiting SNF placement per therapy.  Haldol  and lorazepam  only to be used for severe agitation only.   TTS consult requested on 10/11 and seen patient on 10/12, see new recommendations.  Increased dose of risperdal  per recommendations to 0.5 mg BID on 10/17, per psychiatrist recommendations -Continue lamictal  25 mg BID per psychiatrist recommendation   Hypothyroidism Continue Synthroid .  Latest TSH of 1.4   Essential hypertension Continue metoprolol  and Cozaar    Abnormal UA Question of UTI Pt is too mentally ill to get an accurate history regarding dysuria symptoms, therefore we treated with a dose of fosfomycin on 11/15/23   GERD Continue Protonix    COPD Continue bronchodilators   Chronic low back pain Pain management ordered with 1 time dose of ketorolac  given this morning, continue lidocaine  patch   Anxiety/depression Continue Lamictal  and Prozac    Physical deconditioning Continue physical therapy and recommended skilled nursing facility placement.   Hypokalemia Replenished and improved.  Latest potassium of 4.3.   Dispo: Anticipated discharge to skilled nursing facility/memory unit after guardianship has been established and acquired.     Diet Orders (From admission, onward)     Start     Ordered   10/27/23 1501  DIET DYS 2 Room service appropriate? Yes; Fluid consistency: Thin  Diet effective now       Comments: Meds ok whole with liquids  Question Answer Comment  Room service appropriate? Yes   Fluid consistency: Thin      10/27/23 1500            Objective: Vitals:  11/18/23 2015 11/19/23 0505 11/19/23 0854 11/19/23 1259  BP: (!) 142/61 (!) 132/52  (!) 148/69  Pulse: 62 (!) 59  (!) 57  Resp: 18 16  17   Temp: 98.5 F (36.9 C) (!) 97.5 F (36.4 C)  97.8 F (36.6 C)  TempSrc:  Oral  Oral  SpO2: 98% 96% 96% 99%  Weight:  51 kg    Height:       General exam: In no major distress; able to follow simple  commands. Respiratory system: Clear to auscultation. Respiratory effort normal.  Good saturation on room air. Cardiovascular system:RRR.  No rubs or gallops; no JVD. Gastrointestinal system: Abdomen is nondistended, soft and nontender. No organomegaly or masses felt. Normal bowel sounds heard. Central nervous system: Alert, awake and oriented x 1 (intermittently); able to move 4 limbs spontaneously.  No focal neurological deficits. Extremities: No signor clubbing. Skin: No rashes, no petechiae. Psychiatry: Judgement and insight appear impaired.  Flat affect.    Intake/Output Summary (Last 24 hours) at 11/19/2023 1826 Last data filed at 11/19/2023 0930 Gross per 24 hour  Intake 240 ml  Output --  Net 240 ml   Filed Weights   11/17/23 0603 11/18/23 0641 11/19/23 0505  Weight: 51.9 kg 51.6 kg 51 kg    Scheduled Meds:  acetaminophen   1,000 mg Oral Q8H   enoxaparin  (LOVENOX ) injection  40 mg Subcutaneous Q24H   FLUoxetine   40 mg Oral Daily   fluticasone  furoate-vilanterol  1 puff Inhalation Daily   gabapentin   600 mg Oral TID   lamoTRIgine   25 mg Oral BID   latanoprost   1 drop Both Eyes QHS   levothyroxine   25 mcg Oral QAC breakfast   lidocaine   1 patch Transdermal Q24H   liver oil-zinc  oxide   Topical TID   losartan   25 mg Oral Daily   melatonin  3 mg Oral QHS   metoprolol  tartrate  12.5 mg Oral BID   multivitamin with minerals  1 tablet Oral Daily   pantoprazole   40 mg Oral Daily   risperiDONE   0.5 mg Oral BID   Continuous Infusions:  Nutritional status Signs/Symptoms: mild muscle depletion, mild fat depletion Interventions: Ensure Enlive (each supplement provides 350kcal and 20 grams of protein), Magic cup, MVI Body mass index is 22.71 kg/m.  Data Reviewed:   CBC: Recent Labs  Lab 11/12/23 2342 11/16/23 0028  WBC 9.2 8.3  HGB 11.3* 12.0  HCT 34.2* 36.8  MCV 99.1 99.2  PLT 188 211   Basic Metabolic Panel: Recent Labs  Lab 11/12/23 2342 11/16/23 0028  NA  136 136  K 4.3 4.4  CL 101 100  CO2 26 28  GLUCOSE 115* 105*  BUN 25* 26*  CREATININE 0.77 0.72  CALCIUM  9.2 9.4  MG 2.5* 2.4   GFR: Estimated Creatinine Clearance: 39.5 mL/min (by C-G formula based on SCr of 0.72 mg/dL).   Recent Labs  Lab 11/14/23 1121  AMMONIA 17    Radiology Studies: No results found.    LOS: 24 days   Time spent= 35 mins    Eric Nunnery, MD [facility]  If 7PM-7AM, please contact night-coverage  11/19/2023, 6:26 PM

## 2023-11-19 NOTE — Plan of Care (Signed)

## 2023-11-19 NOTE — Progress Notes (Signed)
 Nutrition Follow-up  DOCUMENTATION CODES:   Non-severe (moderate) malnutrition in context of chronic illness  INTERVENTION:   Ensure Plus High Protein BID prn meal completion less than 50%, each supplement provides 350 kcal and 20 grams of protein. MVI with minerals. Magic cup TID with meals, each supplement provides 290 kcal and 9 grams of protein.  NUTRITION DIAGNOSIS:   Moderate Malnutrition related to chronic illness (asthma/COPD) as evidenced by mild muscle depletion, mild fat depletion; new diagnosis.  GOAL:   Patient will meet greater than or equal to 90% of their needs; met.  MONITOR:   PO intake, Supplement acceptance  REASON FOR ASSESSMENT:   Malnutrition Screening Tool    ASSESSMENT:   Pt with medical history significant for asthma, bipolar disorder, hypertension, chronic pain, and bronchiectasis presented with altered mental status.  Patient states that she is eating everything they put in front of her. She has not been drinking the Ensure because she doesn't feel like she needs it since she is eating well.   Diet: dysphagia 2 with thin liquids Meal intakes: 30-100% since yesterday  Labs reviewed.  Medications reviewed and include MVI, protonix .  Patient meets criteria for moderate malnutrition, given mild depletion of muscle and subcutaneous fat mass. This is likely r/t chronic asthma / COPD, but could also be r/t recent minimal intake at home PTA; unsure how long this was going on.   NUTRITION - FOCUSED PHYSICAL EXAM:  Flowsheet Row Most Recent Value  Orbital Region Mild depletion  Upper Arm Region Mild depletion  Thoracic and Lumbar Region No depletion  Buccal Region No depletion  Temple Region Mild depletion  Clavicle Bone Region Mild depletion  Clavicle and Acromion Bone Region Mild depletion  Scapular Bone Region Mild depletion  Dorsal Hand Severe depletion  Patellar Region Mild depletion  Anterior Thigh Region Mild depletion  Posterior Calf  Region Mild depletion  Edema (RD Assessment) None  Hair Reviewed  Eyes Reviewed  Mouth Reviewed  Skin Reviewed  Nails Reviewed    Diet Order:   Diet Order             DIET DYS 2 Room service appropriate? Yes; Fluid consistency: Thin  Diet effective now                   EDUCATION NEEDS:   No education needs have been identified at this time  Skin:  Skin Assessment: Reviewed RN Assessment  Last BM:  10/17 type 6  Height:   Ht Readings from Last 1 Encounters:  10/25/23 4' 11 (1.499 m)    Weight:   Wt Readings from Last 1 Encounters:  11/19/23 51 kg    Ideal Body Weight:  44.5 kg  BMI:  Body mass index is 22.71 kg/m.  Estimated Nutritional Needs:   Kcal:  1450-1650  Protein:  70-85 grams  Fluid:  1.4-1.6 L   Suzen HUNT RD, LDN, CNSC Contact via secure chat. If unavailable, use group chat RD Inpatient.

## 2023-11-19 NOTE — Plan of Care (Signed)
  Problem: Education: Goal: Knowledge of General Education information will improve Description: Including pain rating scale, medication(s)/side effects and non-pharmacologic comfort measures Outcome: Completed/Met   Problem: Health Behavior/Discharge Planning: Goal: Ability to manage health-related needs will improve Outcome: Completed/Met   Problem: Clinical Measurements: Goal: Will remain free from infection Outcome: Completed/Met

## 2023-11-20 DIAGNOSIS — E440 Moderate protein-calorie malnutrition: Secondary | ICD-10-CM | POA: Insufficient documentation

## 2023-11-20 DIAGNOSIS — E039 Hypothyroidism, unspecified: Secondary | ICD-10-CM | POA: Diagnosis not present

## 2023-11-20 DIAGNOSIS — Z556 Problems related to health literacy: Secondary | ICD-10-CM | POA: Diagnosis not present

## 2023-11-20 DIAGNOSIS — F3131 Bipolar disorder, current episode depressed, mild: Secondary | ICD-10-CM | POA: Diagnosis not present

## 2023-11-20 DIAGNOSIS — F0390 Unspecified dementia without behavioral disturbance: Secondary | ICD-10-CM | POA: Diagnosis not present

## 2023-11-20 NOTE — Progress Notes (Signed)
 PROGRESS NOTE    Heidi Fuentes  FMW:[id number] DOB: 24-Nov-1945 DOA: 10/25/2023 PCP: [name], DO   Brief Hospital admission narrative: As per H&P written by Dr. Pearlean on 10/25/2023  Heidi Fuentes is a 78 y.o. female with medical history significant for asthma, bipolar disorder, hypertension, chronic pain, and bronchiectasis. Patient was brought to the ED via EMS for reports of altered mental status.  Patient is alone but has a caregiver, she called patient's son the patient was not her normal self, and he said to bring patient to the ED.  The time of my evaluation, patient is awake and alert and oriented to person, but not time, she is unable to tell me why she is here. She appears to answer other questions appropriately.  She denies difficulty breathing, no cough, no vomiting or diarrhea, no weakness of her extremities, endorses good oral intake.   She was hospitalized back in February- 2/1 to 2/6 also for altered mental status thought secondary to COVID infection and  hypoxia.   ED Course: Temperature 98.4.  Heart rate 92-118, respiratory rate 14-26.  Blood pressure systolic 131-156.  UA not suggestive of UTI.  Ammonia 18.  Potassium 3.3.  Head CT negative for acute abnormality.  Portable chest x-ray unremarkable. MRI ordered and pending  Assessment and plan: Acute metabolic/toxic encephalopathy with delirium  Probable progressive cognitive impairment/ polypharmacy on admission.   Multiple psychiatry medications has been discontinued/adjusted.  Currently patient is on Prozac , Lamictal  and Haldol  and lorazepam  as needed. Currently awaiting guardianship.   Sitter and security for agitation as needed.  Increased haldol  dose on 10/11 for PRN severe agitation only.  Added lorazepam  as needed for severe agitation only.   I requested a new TTS eval on 10/11 but they did not see patient until 10/12. See new recommendations.    Bipolar disorder IRIS team saw the patient on 9/29, does  not meet inpatient criteria.  awaiting SNF placement per therapy.  Haldol  and lorazepam  only to be used for severe agitation only.   TTS consult requested on 10/11 and seen patient on 10/12, see new recommendations.  Increased dose of risperdal  per recommendations to 0.5 mg BID on 10/17, per psychiatrist recommendations.  (So far well-tolerated). -Continue lamictal  25 mg BID per psychiatrist recommendation   Hypothyroidism Continue Synthroid .  Latest TSH of 1.4   Essential hypertension Continue metoprolol  and Cozaar    Abnormal UA Question of UTI Pt is too mentally ill to get an accurate history regarding dysuria symptoms, therefore we treated with a dose of fosfomycin on 11/15/23   GERD Continue Protonix    COPD Continue bronchodilators   Chronic low back pain Pain management ordered with 1 time dose of ketorolac  given this morning, continue lidocaine  patch   Anxiety/depression Continue Lamictal  and Prozac    Physical deconditioning Continue physical therapy and recommended skilled nursing facility placement.   Hypokalemia Replenished and improved.  Latest potassium of 4.3.   Dispo: Anticipated discharge to skilled nursing facility/memory unit after guardianship has been established and acquired.     Diet Orders (From admission, onward)     Start     Ordered   10/27/23 1501  DIET DYS 2 Room service appropriate? Yes; Fluid consistency: Thin  Diet effective now       Comments: Meds ok whole with liquids  Question Answer Comment  Room service appropriate? Yes   Fluid consistency: Thin      10/27/23 1500  Subjective: Afebrile, no overnight events.  Patient reports no nausea, no vomiting, no chest pain or shortness of breath.  Objective: Vitals:   11/19/23 2119 11/20/23 0500 11/20/23 0551 11/20/23 1025  BP: (!) 103/55  138/65   Pulse: 63  60   Resp:   17   Temp:   97.6 F (36.4 C)   TempSrc:      SpO2:   97% 95%  Weight:  52 kg    Height:        General exam: Alert, awake, oriented x 2 intermittently.  Following commands appropriately and in no acute distress.  Per nursing staff no significant agitation overnight. Respiratory system: Clear to auscultation. Respiratory effort normal.  Good saturation on room air. Cardiovascular system:RRR. No murmurs, rubs, gallops. Gastrointestinal system: Abdomen is nondistended, soft and nontender. No organomegaly or masses felt. Normal bowel sounds heard. Central nervous system: Alert and oriented. No focal neurological deficits. Extremities: No cyanosis or clubbing.  Complaining of lower back pain. Skin: No rashes, no petechiae. Psychiatry: Flat affect appreciated on exam.   Intake/Output Summary (Last 24 hours) at 11/20/2023 1641 Last data filed at 11/20/2023 1300 Gross per 24 hour  Intake 480 ml  Output --  Net 480 ml   Filed Weights   11/18/23 0641 11/19/23 0505 11/20/23 0500  Weight: 51.6 kg 51 kg 52 kg    Scheduled Meds:  acetaminophen   1,000 mg Oral Q8H   enoxaparin  (LOVENOX ) injection  40 mg Subcutaneous Q24H   FLUoxetine   40 mg Oral Daily   fluticasone  furoate-vilanterol  1 puff Inhalation Daily   gabapentin   600 mg Oral TID   lamoTRIgine   25 mg Oral BID   latanoprost   1 drop Both Eyes QHS   levothyroxine   25 mcg Oral QAC breakfast   lidocaine   1 patch Transdermal Q24H   liver oil-zinc  oxide   Topical TID   losartan   25 mg Oral Daily   melatonin  3 mg Oral QHS   metoprolol  tartrate  12.5 mg Oral BID   multivitamin with minerals  1 tablet Oral Daily   pantoprazole   40 mg Oral Daily   risperiDONE   0.5 mg Oral BID   Continuous Infusions:  Nutritional status Signs/Symptoms: mild muscle depletion, mild fat depletion Interventions: Ensure Enlive (each supplement provides 350kcal and 20 grams of protein), Magic cup, MVI Body mass index is 23.15 kg/m.  Data Reviewed:   CBC: Recent Labs  Lab 11/16/23 0028  WBC 8.3  HGB 12.0  HCT 36.8  MCV 99.2  PLT 211    Basic Metabolic Panel: Recent Labs  Lab 11/16/23 0028  NA 136  K 4.4  CL 100  CO2 28  GLUCOSE 105*  BUN 26*  CREATININE 0.72  CALCIUM  9.4  MG 2.4   GFR: Estimated Creatinine Clearance: 42.7 mL/min (by C-G formula based on SCr of 0.72 mg/dL).   Recent Labs  Lab 11/14/23 1121  AMMONIA 17    Radiology Studies: No results found.    LOS: 25 days   Time spent= 35 mins    Eric Nunnery, MD [facility]  If 7PM-7AM, please contact night-coverage  11/20/2023, 4:41 PM

## 2023-11-20 NOTE — Plan of Care (Signed)
  Problem: Clinical Measurements: Goal: Ability to maintain clinical measurements within normal limits will improve Outcome: Adequate for Discharge Goal: Diagnostic test results will improve Outcome: Adequate for Discharge   

## 2023-11-21 DIAGNOSIS — E039 Hypothyroidism, unspecified: Secondary | ICD-10-CM | POA: Diagnosis not present

## 2023-11-21 DIAGNOSIS — Z556 Problems related to health literacy: Secondary | ICD-10-CM | POA: Diagnosis not present

## 2023-11-21 DIAGNOSIS — F3131 Bipolar disorder, current episode depressed, mild: Secondary | ICD-10-CM | POA: Diagnosis not present

## 2023-11-21 DIAGNOSIS — F0390 Unspecified dementia without behavioral disturbance: Secondary | ICD-10-CM | POA: Diagnosis not present

## 2023-11-21 MED ORDER — METHOCARBAMOL 500 MG PO TABS
500.0000 mg | ORAL_TABLET | Freq: Three times a day (TID) | ORAL | Status: DC | PRN
  Administered 2023-11-21 – 2023-11-24 (×5): 500 mg via ORAL
  Filled 2023-11-21 (×5): qty 1

## 2023-11-21 NOTE — Progress Notes (Signed)
 PROGRESS NOTE    Heidi Fuentes  FMW:[id number] DOB: 1946-01-10 DOA: 10/25/2023 PCP: [name], DO   Brief Hospital admission narrative: As per H&P written by Dr. Pearlean on 10/25/2023  Heidi Fuentes is a 78 y.o. female with medical history significant for asthma, bipolar disorder, hypertension, chronic pain, and bronchiectasis. Patient was brought to the ED via EMS for reports of altered mental status.  Patient is alone but has a caregiver, she called patient's son the patient was not her normal self, and he said to bring patient to the ED.  The time of my evaluation, patient is awake and alert and oriented to person, but not time, she is unable to tell me why she is here. She appears to answer other questions appropriately.  She denies difficulty breathing, no cough, no vomiting or diarrhea, no weakness of her extremities, endorses good oral intake.   She was hospitalized back in February- 2/1 to 2/6 also for altered mental status thought secondary to COVID infection and  hypoxia.   ED Course: Temperature 98.4.  Heart rate 92-118, respiratory rate 14-26.  Blood pressure systolic 131-156.  UA not suggestive of UTI.  Ammonia 18.  Potassium 3.3.  Head CT negative for acute abnormality.  Portable chest x-ray unremarkable. MRI ordered and pending  Assessment and plan: Acute metabolic/toxic encephalopathy with delirium  Probable progressive cognitive impairment/ polypharmacy on admission.   Multiple psychiatry medications has been discontinued/adjusted.  Currently patient is on Prozac , Lamictal  and Haldol  and lorazepam  as needed. Currently awaiting guardianship.   Sitter and security for agitation as needed.  Increased haldol  dose on 10/11 for PRN severe agitation only.  Added lorazepam  as needed for severe agitation only.   I requested a new TTS eval on 10/11 but they did not see patient until 10/12. See new recommendations.    Bipolar disorder IRIS team saw the patient on 9/29, does  not meet inpatient criteria.  awaiting SNF placement per therapy.  Haldol  and lorazepam  only to be used for severe agitation only.   TTS consult requested on 10/11 and seen patient on 10/12, see new recommendations.  Increased dose of risperdal  per recommendations to 0.5 mg BID on 10/17, per psychiatrist recommendations.  (So far well-tolerated). -Continue lamictal  25 mg BID per psychiatrist recommendation   Hypothyroidism -Continue Synthroid .  Latest TSH of 1.4   Essential hypertension -Continue metoprolol  and Cozaar    Abnormal UA Question of UTI -Pt has completed treatment with a dose of fosfomycin on 11/15/23. -no dysuria or urinary complaints currently.   GERD -Continue Protonix    COPD -stable -no acute exacerbation appreciated  -Continue bronchodilators   Chronic low back pain -continue lidocaine  patch -PRN tylenol  also in place -will add PRN robaxin    Anxiety/depression -will Continue Lamictal  and Prozac    Physical deconditioning -Continue physical therapy while in house. -recommendations provided are for skilled nursing facility placement.   Hypokalemia -Replenished and improved.   -continue to follow electrolyte intermittently.   Dispo: Anticipated discharge to skilled nursing facility/memory unit after guardianship has been established and acquired.     Diet Orders (From admission, onward)     Start     Ordered   10/27/23 1501  DIET DYS 2 Room service appropriate? Yes; Fluid consistency: Thin  Diet effective now       Comments: Meds ok whole with liquids  Question Answer Comment  Room service appropriate? Yes   Fluid consistency: Thin      10/27/23 1500  Subjective: Afebrile, no chest pain, no nausea, no vomiting.  No overnight events reported..  Objective: Vitals:   11/20/23 1025 11/20/23 2037 11/20/23 2103 11/21/23 0527  BP:  (!) 117/49 (!) 117/49   Pulse:  64 64   Resp:  18    Temp:  98.4 F (36.9 C)    TempSrc:  Oral     SpO2: 95% 99%    Weight:    53 kg  Height:       General exam: Alert, awake, oriented x 2; following commands appropriately and no acute distress appreciated.   Respiratory system: Good saturation on room air. Cardiovascular system:RRR.  No rubs or gallops.  No JVD. Gastrointestinal system: Abdomen is nondistended, soft and nontender. No organomegaly or masses felt. Normal bowel sounds heard. Central nervous system: Moving 4 limbs spontaneously.  No focal neurological deficits. Extremities: No cyanosis or clubbing. Skin: No petechiae. Psychiatry: Flat affect appreciated on exam.   Intake/Output Summary (Last 24 hours) at 11/21/2023 0804 Last data filed at 11/21/2023 0515 Gross per 24 hour  Intake 720 ml  Output --  Net 720 ml   Filed Weights   11/19/23 0505 11/20/23 0500 11/21/23 0527  Weight: 51 kg 52 kg 53 kg    Scheduled Meds:  acetaminophen   1,000 mg Oral Q8H   enoxaparin  (LOVENOX ) injection  40 mg Subcutaneous Q24H   FLUoxetine   40 mg Oral Daily   fluticasone  furoate-vilanterol  1 puff Inhalation Daily   gabapentin   600 mg Oral TID   lamoTRIgine   25 mg Oral BID   latanoprost   1 drop Both Eyes QHS   levothyroxine   25 mcg Oral QAC breakfast   lidocaine   1 patch Transdermal Q24H   liver oil-zinc  oxide   Topical TID   losartan   25 mg Oral Daily   melatonin  3 mg Oral QHS   metoprolol  tartrate  12.5 mg Oral BID   multivitamin with minerals  1 tablet Oral Daily   pantoprazole   40 mg Oral Daily   risperiDONE   0.5 mg Oral BID   Continuous Infusions:  Nutritional status Signs/Symptoms: mild muscle depletion, mild fat depletion Interventions: Ensure Enlive (each supplement provides 350kcal and 20 grams of protein), Magic cup, MVI Body mass index is 23.6 kg/m.  Data Reviewed:   CBC: Recent Labs  Lab 11/16/23 0028  WBC 8.3  HGB 12.0  HCT 36.8  MCV 99.2  PLT 211   Basic Metabolic Panel: Recent Labs  Lab 11/16/23 0028  NA 136  K 4.4  CL 100  CO2 28   GLUCOSE 105*  BUN 26*  CREATININE 0.72  CALCIUM  9.4  MG 2.4   GFR: Estimated Creatinine Clearance: 43.1 mL/min (by C-G formula based on SCr of 0.72 mg/dL).   Recent Labs  Lab 11/14/23 1121  AMMONIA 17    Radiology Studies: No results found.    LOS: 26 days   Time spent= 35 mins    Eric Nunnery, MD [facility]  If 7PM-7AM, please contact night-coverage  11/21/2023, 8:04 AM

## 2023-11-21 NOTE — Plan of Care (Signed)
  Problem: Clinical Measurements: Goal: Ability to maintain clinical measurements within normal limits will improve Outcome: Progressing Goal: Diagnostic test results will improve Outcome: Progressing Goal: Respiratory complications will improve Outcome: Progressing Goal: Cardiovascular complication will be avoided Outcome: Progressing   Problem: Activity: Goal: Risk for activity intolerance will decrease Outcome: Progressing   Problem: Nutrition: Goal: Adequate nutrition will be maintained Outcome: Progressing   Problem: Coping: Goal: Level of anxiety will decrease Outcome: Progressing   Problem: Pain Managment: Goal: General experience of comfort will improve and/or be controlled Outcome: Progressing   Problem: Safety: Goal: Ability to remain free from injury will improve Outcome: Progressing   Problem: Skin Integrity: Goal: Risk for impaired skin integrity will decrease Outcome: Progressing

## 2023-11-22 DIAGNOSIS — F0390 Unspecified dementia without behavioral disturbance: Secondary | ICD-10-CM | POA: Diagnosis not present

## 2023-11-22 LAB — BASIC METABOLIC PANEL WITH GFR
Anion gap: 6 (ref 5–15)
BUN: 26 mg/dL — ABNORMAL HIGH (ref 8–23)
CO2: 29 mmol/L (ref 22–32)
Calcium: 8.6 mg/dL — ABNORMAL LOW (ref 8.9–10.3)
Chloride: 104 mmol/L (ref 98–111)
Creatinine, Ser: 0.75 mg/dL (ref 0.44–1.00)
GFR, Estimated: >60 mL/min (ref >60)
Glucose, Bld: 90 mg/dL (ref 70–99)
Potassium: 4.7 mmol/L (ref 3.5–5.1)
Sodium: 139 mmol/L (ref 135–145)

## 2023-11-22 LAB — CBC
HCT: 32 % — ABNORMAL LOW (ref 36.0–46.0)
Hemoglobin: 10.1 g/dL — ABNORMAL LOW (ref 12.0–15.0)
MCH: 31.6 pg (ref 26.0–34.0)
MCHC: 31.6 g/dL (ref 30.0–36.0)
MCV: 100 fL (ref 80.0–100.0)
Platelets: 188 K/uL (ref 150–400)
RBC: 3.2 MIL/uL — ABNORMAL LOW (ref 3.87–5.11)
RDW: 12.5 % (ref 11.5–15.5)
WBC: 5.8 K/uL (ref 4.0–10.5)
nRBC: 0 % (ref 0.0–0.2)

## 2023-11-22 LAB — MAGNESIUM: Magnesium: 2.6 mg/dL — ABNORMAL HIGH (ref 1.7–2.4)

## 2023-11-22 LAB — LAMOTRIGINE LEVEL: Lamotrigine Lvl: <1 ug/mL — ABNORMAL LOW (ref 2.0–20.0)

## 2023-11-22 NOTE — Plan of Care (Signed)
  Problem: Clinical Measurements: Goal: Ability to maintain clinical measurements within normal limits will improve Outcome: Progressing Goal: Diagnostic test results will improve Outcome: Progressing Goal: Respiratory complications will improve Outcome: Progressing Goal: Cardiovascular complication will be avoided Outcome: Progressing   Problem: Activity: Goal: Risk for activity intolerance will decrease Outcome: Progressing   Problem: Nutrition: Goal: Adequate nutrition will be maintained Outcome: Progressing   Problem: Coping: Goal: Level of anxiety will decrease Outcome: Progressing   Problem: Pain Managment: Goal: General experience of comfort will improve and/or be controlled Outcome: Progressing   Problem: Safety: Goal: Ability to remain free from injury will improve Outcome: Progressing   Problem: Skin Integrity: Goal: Risk for impaired skin integrity will decrease Outcome: Progressing

## 2023-11-22 NOTE — NC FL2 (Signed)
 [place]  MEDICAID [id number] LEVEL OF CARE FORM     IDENTIFICATION  Patient Name: Heidi Fuentes Birthdate: 23-Sep-1945 Sex: female Admission Date (Current Location): 10/25/2023  [facility] and [place] Number:  [employer] and Address:  [facility]  [address], [place] [address]      Provider Number: [id number]  Attending Physician Name and Address:  Willette Adriana LABOR, MD  Relative Name and Phone Number:       Current Level of Care: Hospital Recommended Level of Care: Skilled Nursing Facility Prior Approval Number:    Date Approved/Denied:   PASRR Number: [id number]  Discharge Plan: SNF    Current Diagnoses: Patient Active Problem List   Diagnosis Date Noted   Malnutrition of moderate degree 11/20/2023   Unable to make decisions about medical treatment due to impaired mental capacity 11/12/2023   Major neurocognitive disorder (HCC) 11/03/2023   Rectal prolapse 02/24/2023   Mixed bipolar I disorder (HCC) 07/22/2022   Depression, major, recurrent, mild 07/22/2022   Bipolar affective disorder, depressed, mild (HCC) 08/07/2020   Unilateral primary osteoarthritis, left hip 03/26/2020   Pain in joint involving ankle and foot 12/20/2019   Bronchiectasis without complication (HCC) 10/31/2019   Dysphagia 07/25/2018   Exudative age-related macular degeneration, bilateral, with active choroidal neovascularization (HCC) 07/25/2018   Gastroesophageal reflux disease without esophagitis 10/30/2017   Other specified glaucoma 10/30/2017   Chronic rhinitis 10/30/2017   Essential hypertension 02/17/2017   On long term drug therapy 02/08/2017   Hiatal hernia 02/03/2017   Iron deficiency anemia, unspecified 12/15/2013   Schatzki's ring 08/17/2013   Anemia 08/17/2013   Chronic pain syndrome 07/10/2013   Acquired hypothyroidism 07/09/2013   Asthma, chronic 07/09/2013    Orientation RESPIRATION BLADDER Height & Weight     Self, Time, Situation, Place  Normal  Continent Weight: 116 lb 6.5 oz (52.8 kg) Height:  4' 11 (149.9 cm)  BEHAVIORAL SYMPTOMS/MOOD NEUROLOGICAL BOWEL NUTRITION STATUS      Incontinent Diet (See DC summary)  AMBULATORY STATUS COMMUNICATION OF NEEDS Skin   Limited Assist Verbally Normal                       Personal Care Assistance Level of Assistance  Bathing, Feeding, Dressing Bathing Assistance: Limited assistance Feeding assistance: Independent Dressing Assistance: Limited assistance     Functional Limitations Info  Sight, Hearing, Speech Sight Info: Impaired (eyeglasses) Hearing Info: Impaired Speech Info: Adequate    SPECIAL CARE FACTORS FREQUENCY  PT (By licensed PT), OT (By licensed OT)     PT Frequency: 5x/wk OT Frequency: 5x/wk            Contractures Contractures Info: Not present    Additional Factors Info  Code Status, Allergies, Psychotropic Code Status Info: FULL Allergies Info: Sulfa Antibiotics, Dog Epithelium (Canis Lupus Familiaris), Mirtazapine, Trazodone, Trazodone Hcl, Ziprasidone, Aripiprazole, Quetiapine , Trazodone And Nefazodone, Ziprasidone Hcl Psychotropic Info: See MAR         Current Medications (11/22/2023):  This is the current hospital active medication list Current Facility-Administered Medications  Medication Dose Route Frequency Provider Last Rate Last Admin   acetaminophen  (TYLENOL ) tablet 650 mg  650 mg Oral Q6H PRN Emokpae, Ejiroghene E, MD   650 mg at 11/07/23 9670   Or   acetaminophen  (TYLENOL ) suppository 650 mg  650 mg Rectal Q6H PRN Emokpae, Ejiroghene E, MD       acetaminophen  (TYLENOL ) tablet 1,000 mg  1,000 mg Oral Q8H Tat, David, MD  1,000 mg at 11/22/23 0455   enoxaparin  (LOVENOX ) injection 40 mg  40 mg Subcutaneous Q24H Emokpae, Ejiroghene E, MD   40 mg at 11/21/23 2144   feeding supplement (ENSURE PLUS HIGH PROTEIN) liquid 237 mL  237 mL Oral BID BM PRN Laurence Locus, DO       FLUoxetine  (PROZAC ) capsule 40 mg  40 mg Oral Daily Johnson, Clanford L,  MD   40 mg at 11/22/23 0931   fluticasone  furoate-vilanterol (BREO ELLIPTA ) 200-25 MCG/ACT 1 puff  1 puff Inhalation Daily Emokpae, Ejiroghene E, MD   1 puff at 11/22/23 1039   gabapentin  (NEURONTIN ) capsule 600 mg  600 mg Oral TID Pokhrel, Laxman, MD   600 mg at 11/22/23 0931   glucagon (human recombinant) (GLUCAGEN) injection 1 mg  1 mg Intravenous PRN Amin, Ankit C, MD       guaiFENesin  (ROBITUSSIN) 100 MG/5ML liquid 5 mL  5 mL Oral Q4H PRN Amin, Ankit C, MD       haloperidol  lactate (HALDOL ) injection 2 mg  2 mg Intravenous Q6H PRN Johnson, Clanford L, MD       hydrALAZINE  (APRESOLINE ) injection 10 mg  10 mg Intravenous Q4H PRN Amin, Ankit C, MD       ipratropium-albuterol  (DUONEB) 0.5-2.5 (3) MG/3ML nebulizer solution 3 mL  3 mL Nebulization Q4H PRN Amin, Ankit C, MD       lamoTRIgine  (LAMICTAL ) tablet 25 mg  25 mg Oral BID Johnson, Clanford L, MD   25 mg at 11/22/23 0931   latanoprost  (XALATAN ) 0.005 % ophthalmic solution 1 drop  1 drop Both Eyes QHS Johnson, Clanford L, MD   1 drop at 11/21/23 2149   levothyroxine  (SYNTHROID ) tablet 25 mcg  25 mcg Oral QAC breakfast Emokpae, Ejiroghene E, MD   25 mcg at 11/22/23 0456   lidocaine  (LIDODERM ) 5 % 1 patch  1 patch Transdermal Q24H Evonnie Lenis, MD   1 patch at 11/21/23 1811   liver oil-zinc  oxide (DESITIN) 40 % ointment   Topical TID Amin, Ankit C, MD   Given at 11/22/23 0932   LORazepam  (ATIVAN ) tablet 0.5 mg  0.5 mg Oral Q4H PRN Vicci, Clanford L, MD   0.5 mg at 11/22/23 9473   losartan  (COZAAR ) tablet 25 mg  25 mg Oral Daily Amin, Ankit C, MD   25 mg at 11/22/23 0931   melatonin tablet 3 mg  3 mg Oral QHS Johnson, Clanford L, MD   3 mg at 11/21/23 2144   methocarbamol  (ROBAXIN ) tablet 500 mg  500 mg Oral Q8H PRN Ricky Fines, MD   500 mg at 11/21/23 2145   metoprolol  tartrate (LOPRESSOR ) injection 5 mg  5 mg Intravenous Q4H PRN Amin, Ankit C, MD       metoprolol  tartrate (LOPRESSOR ) tablet 12.5 mg  12.5 mg Oral BID Emokpae, Ejiroghene E, MD    12.5 mg at 11/22/23 0931   multivitamin with minerals tablet 1 tablet  1 tablet Oral Daily Tat, David, MD   1 tablet at 11/22/23 0931   ondansetron  (ZOFRAN ) tablet 4 mg  4 mg Oral Q6H PRN Emokpae, Ejiroghene E, MD       Or   ondansetron  (ZOFRAN ) injection 4 mg  4 mg Intravenous Q6H PRN Emokpae, Ejiroghene E, MD   4 mg at 11/20/23 1531   oxyCODONE  (Oxy IR/ROXICODONE ) immediate release tablet 2.5 mg  2.5 mg Oral Q6H PRN Johnson, Clanford L, MD   2.5 mg at 11/21/23 0716   pantoprazole  (PROTONIX ) EC  tablet 40 mg  40 mg Oral Daily Madueme, Elvira C, RPH   40 mg at 11/22/23 0931   polyethylene glycol (MIRALAX  / GLYCOLAX ) packet 17 g  17 g Oral Daily PRN Emokpae, Ejiroghene E, MD       risperiDONE  (RISPERDAL ) tablet 0.5 mg  0.5 mg Oral BID Ricky Fines, MD   0.5 mg at 11/22/23 9068     Discharge Medications: Please see discharge summary for a list of discharge medications.  Relevant Imaging Results:  Relevant Lab Results:   Additional Information SSN: [id number]  Hoy DELENA Bigness, LCSW

## 2023-11-22 NOTE — Progress Notes (Signed)
 PROGRESS NOTE    Heidi Fuentes  FMW:[id number] DOB: November 15, 1945 DOA: 10/25/2023 PCP: [name], DO   Dispo:  Anticipated discharge to skilled nursing facility/memory unit after guardianship has been established and acquired.   Subjective: Patient was seen and examined this morning, stable no acute distress, awake alert and oriented    Brief Hospital admission narrative: Heidi Fuentes is a 78 y.o. female with medical history significant for asthma, bipolar disorder, hypertension, chronic pain, and bronchiectasis. Patient was brought to the ED via EMS for reports of altered mental status.  Patient is alone but has a caregiver, she called patient's son the patient was not her normal self, and he said to bring patient to the ED.  The time of my evaluation, patient is awake and alert and oriented to person, but not time, she is unable to tell me why she is here. She appears to answer other questions appropriately.  She denies difficulty breathing, no cough, no vomiting or diarrhea, no weakness of her extremities, endorses good oral intake.   She was hospitalized back in February- 2/1 to 2/6 also for altered mental status thought secondary to COVID infection and  hypoxia.   ED Course: Temperature 98.4.  Heart rate 92-118, respiratory rate 14-26.  Blood pressure systolic 131-156.  UA not suggestive of UTI.  Ammonia 18.  Potassium 3.3.  Head CT negative for acute abnormality.  Portable chest x-ray unremarkable. MRI ordered and pending  Assessment and plan:  Acute metabolic/toxic encephalopathy with delirium  Cognitive impairment, associate with polypharmacy  Multiple psychiatry medications has been discontinued/adjusted.   Currently patient is on Prozac , Lamictal  and Haldol  and lorazepam  as needed.  Awaiting guardianship.    - Patient is stable now, one-to-one observation-sitter discontinued   Increased haldol  dose on 10/11 for PRN severe agitation only.    lorazepam  as needed for  severe agitation only.       Bipolar disorder IRIS team saw the patient on 9/29, does not meet inpatient criteria.  awaiting SNF placement per therapy.  Haldol  and lorazepam  only to be used for severe agitation only.    TTS consult requested on 10/11 and seen patient on 10/12, see new recommendations.  Increased dose of Risperdal  per recommendations to 0.5 mg BID on 10/17, and continue Lamictal  25 mg p.o. twice daily-  Per psychiatrist recommendations.  (So far well-tolerated).    Hypothyroidism -Continue Synthroid .  Latest TSH of 1.4   Essential hypertension -Continue metoprolol  and Cozaar    Abnormal UA ? UTI -Pt has completed treatment with a dose of fosfomycin on 11/15/23. Heidi Fuentes   GERD -Continue Protonix    COPD -stable -no signs of exacerbation, as needed nebs    Chronic low back pain -continue lidocaine  patch -PRN tylenol  also in place -will add PRN robaxin    Anxiety/depression -will Continue Lamictal  and Prozac    Physical deconditioning -Continue physical therapy while in house. -recommendations provided are for skilled nursing facility placement.   Hypokalemia -resolved      Diet Orders (From admission, onward)     Start     Ordered   10/27/23 1501  DIET DYS 2 Room service appropriate? Yes; Fluid consistency: Thin  Diet effective now       Comments: Meds ok whole with liquids  Question Answer Comment  Room service appropriate? Yes   Fluid consistency: Thin      10/27/23 1500           Subjective: Afebrile, no chest pain, no nausea, no vomiting.  No overnight events reported..  Objective: Vitals:   11/21/23 2013 11/22/23 0500 11/22/23 0506 11/22/23 1039  BP: 104/64  (!) 152/70   Pulse: 64  62   Resp: 18  18   Temp: 98.8 F (37.1 C)  97.6 F (36.4 C)   TempSrc: Oral     SpO2: 96%  98% 98%  Weight:  52.8 kg    Height:            General:  AAO x 3,  cooperative, no distress;   HEENT:  Normocephalic, PERRL, otherwise with in Normal limits    Neuro:  CNII-XII intact. , normal motor and sensation, reflexes intact   Lungs:   Clear to auscultation BL, Respirations unlabored,  No wheezes / crackles  Cardio:    S1/S2, RRR, No murmure, No Rubs or Gallops   Abdomen:  Soft, non-tender, bowel sounds active all four quadrants, no guarding or peritoneal signs.  Muscular  skeletal:  Limited exam -global generalized weaknesses - in bed, able to move all 4 extremities,   2+ pulses,  symmetric, No pitting edema  Skin:  Dry, warm to touch, negative for any Rashes,  Wounds: Please see nursing documentation           Intake/Output Summary (Last 24 hours) at 11/22/2023 1306 Last data filed at 11/22/2023 0603 Gross per 24 hour  Intake 240 ml  Output --  Net 240 ml   Filed Weights   11/20/23 0500 11/21/23 0527 11/22/23 0500  Weight: 52 kg 53 kg 52.8 kg    Scheduled Meds:  acetaminophen   1,000 mg Oral Q8H   enoxaparin  (LOVENOX ) injection  40 mg Subcutaneous Q24H   FLUoxetine   40 mg Oral Daily   fluticasone  furoate-vilanterol  1 puff Inhalation Daily   gabapentin   600 mg Oral TID   lamoTRIgine   25 mg Oral BID   latanoprost   1 drop Both Eyes QHS   levothyroxine   25 mcg Oral QAC breakfast   lidocaine   1 patch Transdermal Q24H   liver oil-zinc  oxide   Topical TID   losartan   25 mg Oral Daily   melatonin  3 mg Oral QHS   metoprolol  tartrate  12.5 mg Oral BID   multivitamin with minerals  1 tablet Oral Daily   pantoprazole   40 mg Oral Daily   risperiDONE   0.5 mg Oral BID   Continuous Infusions:  Nutritional status Signs/Symptoms: mild muscle depletion, mild fat depletion Interventions: Ensure Enlive (each supplement provides 350kcal and 20 grams of protein), Magic cup, MVI Body mass index is 23.51 kg/m.  Data Reviewed:   CBC: Recent Labs  Lab 11/16/23 0028 11/22/23 0420  WBC 8.3 5.8  HGB 12.0 10.1*  HCT 36.8 32.0*  MCV 99.2 100.0  PLT 211 188   Basic Metabolic Panel: Recent Labs  Lab 11/16/23 0028  11/22/23 0420  NA 136 139  K 4.4 4.7  CL 100 104  CO2 28 29  GLUCOSE 105* 90  BUN 26* 26*  CREATININE 0.72 0.75  CALCIUM  9.4 8.6*  MG 2.4 2.6*   GFR: Estimated Creatinine Clearance: 43 mL/min (by C-G formula based on SCr of 0.75 mg/dL).   No results for input(s): AMMONIA in the last 168 hours.   Radiology Studies: No results found.    LOS: 27 days   Time spent= 35 mins    Adriana DELENA Grams, MD [facility]  If 7PM-7AM, please contact night-coverage  11/22/2023, 1:06 PM

## 2023-11-22 NOTE — TOC Progression Note (Signed)
 Transition of Care [facility]) - Progression Note    Patient Details  Name: Heidi Fuentes MRN: [id number] Date of Birth: Apr 27, 1945  [facility]) CM/SW Contact  Hoy DELENA Bigness, LCSW Phone Number: 11/22/2023, 12:01 PM  Clinical Narrative:    CSW met with pt as she is now oriented x4 to discuss recommendation for SNF placement. Pt adamantly declines SNF placement. Pt shares that she has ramps to get into her house, rollator, RW, and canes she is able to use and feels confident in safely returning home at discharge.   Pt to have court hearing on 10/23 for guardianship determination. CSW left VM w. APS case worker, Aniya [phone] for update on pt's case.    Expected Discharge Plan: Skilled Nursing Facility Barriers to Discharge: Continued Medical Work up  DSS Guardianship request start date: 10/27/23 DSS Service county:  [place], transferred to [place] due to conflict of interest) DSS Date of decision notification: 10/28/23 DSS Guardianship decision: Substantiated      Expected Discharge Plan and Services In-house Referral: Clinical Social Work Discharge Planning Services: NA Post Acute Care Choice: Nursing Home, Skilled Nursing Facility Living arrangements for the past 2 months: Single Family Home                                       Social Drivers of Health (SDOH) Interventions SDOH Screenings   Food Insecurity: No Food Insecurity (10/25/2023)  Housing: Low Risk  (10/25/2023)  Transportation Needs: No Transportation Needs (10/25/2023)  Utilities: Not At Risk (10/25/2023)  Social Connections: Unknown (10/25/2023)  Tobacco Use: Low Risk  (10/25/2023)    Readmission Risk Interventions    11/03/2023   12:54 PM 10/29/2023   10:31 AM 10/28/2023   10:36 AM  Readmission Risk Prevention Plan  Transportation Screening Complete Complete Complete  PCP or Specialist Appt within 5-7 Days   Complete  Home Care Screening  Complete Complete  Medication Review  (RN CM)  Complete Complete  HRI or Home Care Consult Complete    Social Work Consult for Recovery Care Planning/Counseling Complete    Palliative Care Screening Not Applicable    Medication Review Oceanographer) Complete

## 2023-11-22 NOTE — Plan of Care (Signed)
  Problem: Clinical Measurements: Goal: Ability to maintain clinical measurements within normal limits will improve Outcome: Adequate for Discharge   Problem: Nutrition: Goal: Adequate nutrition will be maintained Outcome: Adequate for Discharge   Problem: Pain Managment: Goal: General experience of comfort will improve and/or be controlled Outcome: Adequate for Discharge

## 2023-11-23 DIAGNOSIS — F0390 Unspecified dementia without behavioral disturbance: Secondary | ICD-10-CM | POA: Diagnosis not present

## 2023-11-23 MED ORDER — ACETAMINOPHEN 500 MG PO TABS
1000.0000 mg | ORAL_TABLET | Freq: Three times a day (TID) | ORAL | Status: DC | PRN
  Administered 2023-11-23 – 2023-11-26 (×8): 1000 mg via ORAL
  Filled 2023-11-23 (×8): qty 2

## 2023-11-23 MED ORDER — NYSTATIN 100000 UNIT/GM EX POWD
Freq: Two times a day (BID) | CUTANEOUS | Status: DC
  Administered 2023-11-25: 1 via TOPICAL
  Filled 2023-11-23: qty 15

## 2023-11-23 NOTE — Progress Notes (Signed)
 Mobility Specialist Progress Note:    11/23/23 1020  Mobility  Activity Ambulated with assistance  Level of Assistance Standby assist, set-up cues, supervision of patient - no hands on  Assistive Device Other (Comment) (1 HHA)  Distance Ambulated (ft) 300 ft  Range of Motion/Exercises Active;All extremities  Activity Response Tolerated well  Mobility Referral Yes  Mobility visit 1 Mobility  Mobility Specialist Start Time (ACUTE ONLY) 1020  Mobility Specialist Stop Time (ACUTE ONLY) 1045  Mobility Specialist Time Calculation (min) (ACUTE ONLY) 25 min   Pt received in bed, agreeable to mobility. Required SBA to stand and ambulate with 1 hand-held assist. Tolerated well, c/o back pain. Returned supine, all needs met.  Tiffanie Blassingame Mobility Specialist Please contact via Special educational needs teacher or  Rehab office at [phone]

## 2023-11-23 NOTE — Progress Notes (Signed)
 PROGRESS NOTE    Heidi Fuentes  FMW:[id number] DOB: Dec 06, 1945 DOA: 10/25/2023 PCP: [name], DO   Dispo:  Anticipated discharge to skilled nursing facility/memory unit after guardianship has been established and acquired. TOC following, pending discharge   Subjective: The patient was seen and examined, stable no acute distress No issues overnight   Brief Hospital admission narrative: Heidi Fuentes is a 78 y.o. female with medical history significant for asthma, bipolar disorder, hypertension, chronic pain, and bronchiectasis. Patient was brought to the ED via EMS for reports of altered mental status.  Patient is alone but has a caregiver, she called patient's son the patient was not her normal self, and he said to bring patient to the ED.  The time of my evaluation, patient is awake and alert and oriented to person, but not time, she is unable to tell me why she is here. She appears to answer other questions appropriately.  She denies difficulty breathing, no cough, no vomiting or diarrhea, no weakness of her extremities, endorses good oral intake.   She was hospitalized back in February- 2/1 to 2/6 also for altered mental status thought secondary to COVID infection and  hypoxia.   ED Course: Temperature 98.4.  Heart rate 92-118, respiratory rate 14-26.  Blood pressure systolic 131-156.  UA not suggestive of UTI.  Ammonia 18.  Potassium 3.3.  Head CT negative for acute abnormality.  Portable chest x-ray unremarkable. MRI ordered and pending  Assessment and plan:  Acute metabolic/toxic encephalopathy with delirium  Mentation at baseline, awake alert oriented x 3 Mood stable  Cognitive impairment, associate with polypharmacy Multiple psychiatry medications has been discontinued/adjusted.   Currently patient is on Prozac , Lamictal  and Haldol  and lorazepam  as needed.  Awaiting guardianship.    - Patient is stable now, one-to-one observation-sitter discontinued   Increased  haldol  dose on 10/11 for PRN severe agitation only.    lorazepam  as needed for severe agitation only.       Bipolar disorder Mood-stable Heidi Fuentes team saw the patient on 9/29, does not meet inpatient criteria.  awaiting SNF placement per therapy.  Haldol  and lorazepam  only to be used for severe agitation only.    TTS consult requested on 10/11 and seen patient on 10/12, see new recommendations.  Increased dose of Risperdal  per recommendations to 0.5 mg BID on 10/17, and continue Lamictal  25 mg p.o. twice daily-  Per psychiatrist recommendations.  (So far well-tolerated).    Hypothyroidism -Continue Synthroid .  Latest TSH of 1.4   Essential hypertension -Continue metoprolol  and Cozaar    Abnormal UA ? UTI -Pt has completed treatment with a dose of fosfomycin on 11/15/23. Heidi Fuentes   GERD -Continue Protonix    COPD -stable -no signs of exacerbation, as needed nebs    Chronic low back pain -continue lidocaine  patch -PRN tylenol  also in place -will add PRN robaxin    Anxiety/depression -will Continue Lamictal  and Prozac    Physical deconditioning -Continue physical therapy while in house. -recommendations provided are for skilled nursing facility placement.   Hypokalemia -resolved      Diet Orders (From admission, onward)     Start     Ordered   10/27/23 1501  DIET DYS 2 Room service appropriate? Yes; Fluid consistency: Thin  Diet effective now       Comments: Meds ok whole with liquids  Question Answer Comment  Room service appropriate? Yes   Fluid consistency: Thin      10/27/23 1500  Subjective: Afebrile, no chest pain, no nausea, no vomiting.  No overnight events reported..  Objective: Vitals:   11/22/23 2104 11/23/23 0622 11/23/23 0641 11/23/23 1018  BP: (!) 153/76 (!) 154/87    Pulse: 67 (!) 58    Resp: 19 20    Temp: (!) 97.5 F (36.4 C) (!) 97.5 F (36.4 C)    TempSrc: Oral Oral    SpO2: 99% 99%  99%  Weight:   52.1 kg   Height:         General:   AAO x 3,  cooperative, no distress;   HEENT:  Normocephalic, PERRL, otherwise with in Normal limits   Neuro:  CNII-XII intact. , normal motor and sensation, reflexes intact   Lungs:   Clear to auscultation BL, Respirations unlabored,  No wheezes / crackles  Cardio:    S1/S2, RRR, No murmure, No Rubs or Gallops   Abdomen:  Soft, non-tender, bowel sounds active all four quadrants, no guarding or peritoneal signs.  Muscular  skeletal:  Limited exam -global generalized weaknesses - in bed, able to move all 4 extremities,   2+ pulses,  symmetric, No pitting edema  Skin:  Dry, warm to touch, negative for any Rashes,  Wounds: Please see nursing documentation    No intake or output data in the 24 hours ending 11/23/23 1038  Filed Weights   11/21/23 0527 11/22/23 0500 11/23/23 0641  Weight: 53 kg 52.8 kg 52.1 kg    Scheduled Meds:  acetaminophen   1,000 mg Oral Q8H   enoxaparin  (LOVENOX ) injection  40 mg Subcutaneous Q24H   FLUoxetine   40 mg Oral Daily   fluticasone  furoate-vilanterol  1 puff Inhalation Daily   gabapentin   600 mg Oral TID   lamoTRIgine   25 mg Oral BID   latanoprost   1 drop Both Eyes QHS   levothyroxine   25 mcg Oral QAC breakfast   lidocaine   1 patch Transdermal Q24H   liver oil-zinc  oxide   Topical TID   losartan   25 mg Oral Daily   melatonin  3 mg Oral QHS   metoprolol  tartrate  12.5 mg Oral BID   multivitamin with minerals  1 tablet Oral Daily   nystatin   Topical BID   pantoprazole   40 mg Oral Daily   risperiDONE   0.5 mg Oral BID   Continuous Infusions:  Nutritional status Signs/Symptoms: mild muscle depletion, mild fat depletion Interventions: Ensure Enlive (each supplement provides 350kcal and 20 grams of protein), Magic cup, MVI Body mass index is 23.2 kg/m.  Data Reviewed:   CBC: Recent Labs  Lab 11/22/23 0420  WBC 5.8  HGB 10.1*  HCT 32.0*  MCV 100.0  PLT 188   Basic Metabolic Panel: Recent Labs  Lab 11/22/23 0420  NA 139  K 4.7  CL  104  CO2 29  GLUCOSE 90  BUN 26*  CREATININE 0.75  CALCIUM  8.6*  MG 2.6*   GFR: Estimated Creatinine Clearance: 42.8 mL/min (by C-G formula based on SCr of 0.75 mg/dL).   No results for input(s): AMMONIA in the last 168 hours.   Radiology Studies: No results found.    LOS: 28 days   Time spent= 35 mins    Adriana DELENA Grams, MD [facility]  If 7PM-7AM, please contact night-coverage  11/23/2023, 10:38 AM

## 2023-11-23 NOTE — Plan of Care (Signed)
?  Problem: Clinical Measurements: ?Goal: Ability to maintain clinical measurements within normal limits will improve ?Outcome: Adequate for Discharge ?Goal: Respiratory complications will improve ?Outcome: Adequate for Discharge ?  ?Problem: Activity: ?Goal: Risk for activity intolerance will decrease ?Outcome: Adequate for Discharge ?  ?

## 2023-11-24 DIAGNOSIS — F0390 Unspecified dementia without behavioral disturbance: Secondary | ICD-10-CM | POA: Diagnosis not present

## 2023-11-24 MED ORDER — LOSARTAN POTASSIUM 50 MG PO TABS
50.0000 mg | ORAL_TABLET | Freq: Every day | ORAL | Status: DC
  Administered 2023-11-24 – 2023-11-25 (×2): 50 mg via ORAL
  Filled 2023-11-24 (×2): qty 1

## 2023-11-24 NOTE — Progress Notes (Signed)
 PROGRESS NOTE    Heidi Fuentes  FMW:[id number] DOB: Jun 15, 1945 DOA: 10/25/2023 PCP: [name], DO   Dispo:  Anticipated discharge to skilled nursing facility/memory unit after guardianship has been established and acquired. TOC following, pending discharge Court date 11/25/2023   Subjective: The patient was seen and examined this morning, stable no acute distress no issues overnight  Anticipating a 5 her p.o. in court tomorrow   Brief Hospital admission narrative: Heidi Fuentes is a 78 y.o. female with medical history significant for asthma, bipolar disorder, hypertension, chronic pain, and bronchiectasis. Patient was brought to the ED via EMS for reports of altered mental status.  Patient is alone but has a caregiver, she called patient's son the patient was not her normal self, and he said to bring patient to the ED.  The time of my evaluation, patient is awake and alert and oriented to person, but not time, she is unable to tell me why she is here. She appears to answer other questions appropriately.  She denies difficulty breathing, no cough, no vomiting or diarrhea, no weakness of her extremities, endorses good oral intake.   She was hospitalized back in February- 2/1 to 2/6 also for altered mental status thought secondary to COVID infection and  hypoxia.   ED Course: Temperature 98.4.  Heart rate 92-118, respiratory rate 14-26.  Blood pressure systolic 131-156.  UA not suggestive of UTI.  Ammonia 18.  Potassium 3.3.  Head CT negative for acute abnormality.  Portable chest x-ray unremarkable. MRI ordered and pending  Assessment and plan:  Acute metabolic/toxic encephalopathy with delirium  Mentation at baseline, awake alert oriented x 4  Cognitive impairment, associate with polypharmacy Multiple psychiatry medications has been discontinued/adjusted.   Currently patient is on Prozac , Lamictal  and Haldol  and lorazepam  as needed.  Awaiting guardianship.    - Patient  is stable now, one-to-one observation-sitter discontinued   Increased haldol  dose on 10/11 for PRN severe agitation only.    lorazepam  as needed for severe agitation only.       Bipolar disorder Her mood remains stable tolerating current medications IRIS team saw the patient on 9/29, does not meet inpatient criteria.  awaiting SNF placement per therapy.  Haldol  and lorazepam  only to be used for severe agitation only.    TTS consult requested on 10/11 and seen patient on 10/12, see new recommendations.  Increased dose of Risperdal  per recommendations to 0.5 mg BID on 10/17, and continue Lamictal  25 mg p.o. twice daily-  Per psychiatrist recommendations.  (So far well-tolerated).    Hypothyroidism -Continue Synthroid .  Latest TSH of 1.4  Essential hypertension -Continue metoprolol  and Cozaar -increasing dosage for better blood pressure control   Abnormal UA ? UTI -Pt has completed treatment with a dose of fosfomycin on 11/15/23.  GERD -Continue Protonix    COPD -stable -no signs of exacerbation, as needed nebs   Chronic low back pain -continue lidocaine  patch -PRN tylenol  also in place -will add PRN robaxin    Anxiety/depression -will Continue Lamictal  and Prozac    Physical deconditioning -Continue physical therapy while in house. -recommendations provided are for skilled nursing facility placement.   Hypokalemia -resolved   Diet Orders (From admission, onward)     Start     Ordered   10/27/23 1501  DIET DYS 2 Room service appropriate? Yes; Fluid consistency: Thin  Diet effective now       Comments: Meds ok whole with liquids  Question Answer Comment  Room service appropriate? Yes  Fluid consistency: Thin      10/27/23 1500           Objective: Vitals:   11/23/23 1945 11/24/23 0500 11/24/23 0844 11/24/23 0855  BP: (!) 147/43   (!) 154/79  Pulse: 62   62  Resp: (!) 21   18  Temp: 97.8 F (36.6 C)   (!) 97.5 F (36.4 C)  TempSrc: Oral   Axillary  SpO2: 100%  97%  100%  Weight:  52.4 kg    Height:              General:  AAO x 3,  cooperative, no distress;   HEENT:  Normocephalic, PERRL, otherwise with in Normal limits   Neuro:  CNII-XII intact. , normal motor and sensation, reflexes intact   Lungs:   Clear to auscultation BL, Respirations unlabored,  No wheezes / crackles  Cardio:    S1/S2, RRR, No murmure, No Rubs or Gallops   Abdomen:  Soft, non-tender, bowel sounds active all four quadrants, no guarding or peritoneal signs.  Muscular  skeletal:  Limited exam -global generalized weaknesses - in bed, able to move all 4 extremities,   2+ pulses,  symmetric, No pitting edema  Skin:  Dry, warm to touch, negative for any Rashes,  Wounds: Please see nursing documentation          Intake/Output Summary (Last 24 hours) at 11/24/2023 1133 Last data filed at 11/24/2023 0900 Gross per 24 hour  Intake 480 ml  Output --  Net 480 ml    Filed Weights   11/22/23 0500 11/23/23 0641 11/24/23 0500  Weight: 52.8 kg 52.1 kg 52.4 kg    Scheduled Meds:  enoxaparin  (LOVENOX ) injection  40 mg Subcutaneous Q24H   FLUoxetine   40 mg Oral Daily   fluticasone  furoate-vilanterol  1 puff Inhalation Daily   gabapentin   600 mg Oral TID   lamoTRIgine   25 mg Oral BID   latanoprost   1 drop Both Eyes QHS   levothyroxine   25 mcg Oral QAC breakfast   lidocaine   1 patch Transdermal Q24H   losartan   50 mg Oral Daily   melatonin  3 mg Oral QHS   metoprolol  tartrate  12.5 mg Oral BID   multivitamin with minerals  1 tablet Oral Daily   nystatin   Topical BID   pantoprazole   40 mg Oral Daily   risperiDONE   0.5 mg Oral BID   Continuous Infusions:  Nutritional status Signs/Symptoms: mild muscle depletion, mild fat depletion Interventions: Ensure Enlive (each supplement provides 350kcal and 20 grams of protein), Magic cup, MVI Body mass index is 23.33 kg/m.  Data Reviewed:   CBC: Recent Labs  Lab 11/22/23 0420  WBC 5.8  HGB 10.1*  HCT 32.0*  MCV  100.0  PLT 188   Basic Metabolic Panel: Recent Labs  Lab 11/22/23 0420  NA 139  K 4.7  CL 104  CO2 29  GLUCOSE 90  BUN 26*  CREATININE 0.75  CALCIUM  8.6*  MG 2.6*   GFR: Estimated Creatinine Clearance: 42.9 mL/min (by C-G formula based on SCr of 0.75 mg/dL).   No results for input(s): AMMONIA in the last 168 hours.   Radiology Studies: No results found.    LOS: 29 days   Time spent= 35 mins    Adriana DELENA Grams, MD [facility]  If 7PM-7AM, please contact night-coverage  11/24/2023, 11:33 AM

## 2023-11-24 NOTE — Progress Notes (Signed)
 Mobility Specialist Progress Note:    11/24/23 1415  Mobility  Activity Ambulated with assistance  Level of Assistance Standby assist, set-up cues, supervision of patient - no hands on  Assistive Device Other (Comment) (1 HHA)  Distance Ambulated (ft) 300 ft  Range of Motion/Exercises Active;All extremities  Activity Response Tolerated well  Mobility Referral Yes  Mobility visit 1 Mobility  Mobility Specialist Start Time (ACUTE ONLY) 1415  Mobility Specialist Stop Time (ACUTE ONLY) 1440  Mobility Specialist Time Calculation (min) (ACUTE ONLY) 25 min   Pt received in bed, agreeable to mobility. Required SBA to stand and ambulate with 1 hand-held assist. Tolerated well, asx throughout. Returned supine, all needs met.  Heidi Fuentes Mobility Specialist Please contact via Special educational needs teacher or  Rehab office at [phone]

## 2023-11-24 NOTE — Plan of Care (Signed)
  Problem: Clinical Measurements: Goal: Ability to maintain clinical measurements within normal limits will improve Outcome: Progressing Goal: Diagnostic test results will improve Outcome: Progressing Goal: Respiratory complications will improve Outcome: Progressing Goal: Cardiovascular complication will be avoided Outcome: Progressing   Problem: Activity: Goal: Risk for activity intolerance will decrease Outcome: Progressing   Problem: Nutrition: Goal: Adequate nutrition will be maintained Outcome: Progressing   Problem: Coping: Goal: Level of anxiety will decrease Outcome: Progressing   Problem: Pain Managment: Goal: General experience of comfort will improve and/or be controlled Outcome: Progressing   Problem: Safety: Goal: Ability to remain free from injury will improve Outcome: Progressing   Problem: Skin Integrity: Goal: Risk for impaired skin integrity will decrease Outcome: Progressing

## 2023-11-25 DIAGNOSIS — F0390 Unspecified dementia without behavioral disturbance: Secondary | ICD-10-CM | POA: Diagnosis not present

## 2023-11-25 MED ORDER — LOSARTAN POTASSIUM 50 MG PO TABS
100.0000 mg | ORAL_TABLET | Freq: Every day | ORAL | Status: DC
  Administered 2023-11-26: 100 mg via ORAL
  Filled 2023-11-25: qty 2

## 2023-11-25 MED ORDER — CARVEDILOL 3.125 MG PO TABS
3.1250 mg | ORAL_TABLET | Freq: Two times a day (BID) | ORAL | Status: DC
  Administered 2023-11-26: 3.125 mg via ORAL
  Filled 2023-11-25: qty 1

## 2023-11-25 MED ORDER — POLYVINYL ALCOHOL 1.4 % OP SOLN
1.0000 [drp] | OPHTHALMIC | Status: DC | PRN
  Administered 2023-11-25: 1 [drp] via OPHTHALMIC
  Filled 2023-11-25: qty 15

## 2023-11-25 NOTE — Progress Notes (Signed)
 PROGRESS NOTE    Heidi Fuentes  FMW:[id number] DOB: [birth year] DOA: 10/25/2023 PCP: [name], DO   Dispo: Anticipating discharge to assisted living [address] date 11/25/2023 -pending guardianship to be established   Subjective: The patient was seen and examined this morning, stable no acute distress  Heart rate at 54, blood pressure 159/91    Brief Hospital admission narrative: Heidi Fuentes is a 78 y.o. female with medical history significant for asthma, bipolar disorder, hypertension, chronic pain, and bronchiectasis. Patient was brought to the ED via EMS for reports of altered mental status.  Patient is alone but has a caregiver, she called patient's son the patient was not her normal self, and he said to bring patient to the ED.  The time of my evaluation, patient is awake and alert and oriented to person, but not time, she is unable to tell me why she is here. She appears to answer other questions appropriately.  She denies difficulty breathing, no cough, no vomiting or diarrhea, no weakness of her extremities, endorses good oral intake.   She was hospitalized back in February- 2/1 to 2/6 also for altered mental status thought secondary to COVID infection and  hypoxia.   ED Course: Temperature 98.4.  Heart rate 92-118, respiratory rate 14-26.  Blood pressure systolic 131-156.  UA not suggestive of UTI.  Ammonia 18.  Potassium 3.3.  Head CT negative for acute abnormality.  Portable chest x-ray unremarkable. MRI ordered and pending  Assessment and plan:  Acute metabolic/toxic encephalopathy with delirium  Mentation at baseline, awake alert oriented x 4  Cognitive impairment, associate with polypharmacy Multiple psychiatry medications has been discontinued/adjusted.   Currently patient is on Prozac , Lamictal  and Haldol  and lorazepam  as needed.  Awaiting guardianship.    - Patient is stable now, one-to-one observation-sitter discontinued   Increased haldol  dose on 10/11  for PRN severe agitation only.    lorazepam  as needed for severe agitation only.       Bipolar disorder Her mood remains stable tolerating current medications IRIS team saw the patient on 9/29, does not meet inpatient criteria.  awaiting SNF placement per therapy.  Haldol  and lorazepam  only to be used for severe agitation only.    TTS consult requested on 10/11 and seen patient on 10/12, see new recommendations.  Increased dose of Risperdal  per recommendations to 0.5 mg BID on 10/17, and continue Lamictal  25 mg p.o. twice daily-  Per psychiatrist recommendations.  (So far well-tolerated).    Hypothyroidism -Continue Synthroid .  Latest TSH of 1.4  Essential hypertension -changing metoprolol  to Coreg-lower dose due to mild bradycardia increasing Cozaar  due to elevated blood pressure     Abnormal UA ? UTI -Pt has completed treatment with a dose of fosfomycin on 11/15/23.  GERD -Continue Protonix    COPD -stable -no signs of exacerbation, as needed nebs   Chronic low back pain -continue lidocaine  patch -PRN tylenol  also in place -will add PRN robaxin    Anxiety/depression -will Continue Lamictal  and Prozac    Physical deconditioning -Continue physical therapy while in house. -recommendations provided are for skilled nursing facility placement.   Hypokalemia -resolved   Diet Orders (From admission, onward)     Start     Ordered   10/27/23 1501  DIET DYS 2 Room service appropriate? Yes; Fluid consistency: Thin  Diet effective now       Comments: Meds ok whole with liquids  Question Answer Comment  Room service appropriate? Yes   Fluid consistency: Thin  10/27/23 1500           Objective: Vitals:   11/25/23 0551 11/25/23 0837 11/25/23 0908 11/25/23 0912  BP:  (!) 159/91 (!) 159/91   Pulse:  (!) 54 (!) 54   Resp:  17    Temp:  98.5 F (36.9 C)    TempSrc:  Oral    SpO2:  98%  98%  Weight: 53.6 kg     Height:              General:  AAO x 3,  cooperative,  no distress;   HEENT:  Normocephalic, PERRL, otherwise with in Normal limits   Neuro:  CNII-XII intact. , normal motor and sensation, reflexes intact   Lungs:   Clear to auscultation BL, Respirations unlabored,  No wheezes / crackles  Cardio:    S1/S2, RRR, No murmure, No Rubs or Gallops   Abdomen:  Soft, non-tender, bowel sounds active all four quadrants, no guarding or peritoneal signs.  Muscular  skeletal:  Limited exam -global generalized weaknesses - in bed, able to move all 4 extremities,   2+ pulses,  symmetric, No pitting edema  Skin:  Dry, warm to touch, negative for any Rashes,  Wounds: Please see nursing documentation             Intake/Output Summary (Last 24 hours) at 11/25/2023 1238 Last data filed at 11/25/2023 0900 Gross per 24 hour  Intake 720 ml  Output --  Net 720 ml    Filed Weights   11/23/23 0641 11/24/23 0500 11/25/23 0551  Weight: 52.1 kg 52.4 kg 53.6 kg    Scheduled Meds:  [START ON 11/26/2023] carvedilol  3.125 mg Oral BID WC   enoxaparin  (LOVENOX ) injection  40 mg Subcutaneous Q24H   FLUoxetine   40 mg Oral Daily   fluticasone  furoate-vilanterol  1 puff Inhalation Daily   gabapentin   600 mg Oral TID   lamoTRIgine   25 mg Oral BID   latanoprost   1 drop Both Eyes QHS   levothyroxine   25 mcg Oral QAC breakfast   lidocaine   1 patch Transdermal Q24H   [START ON 11/26/2023] losartan   100 mg Oral Daily   melatonin  3 mg Oral QHS   multivitamin with minerals  1 tablet Oral Daily   nystatin   Topical BID   pantoprazole   40 mg Oral Daily   risperiDONE   0.5 mg Oral BID   Continuous Infusions:  Nutritional status Signs/Symptoms: mild muscle depletion, mild fat depletion Interventions: Ensure Enlive (each supplement provides 350kcal and 20 grams of protein), Magic cup, MVI Body mass index is 23.87 kg/m.  Data Reviewed:   CBC: Recent Labs  Lab 11/22/23 0420  WBC 5.8  HGB 10.1*  HCT 32.0*  MCV 100.0  PLT 188   Basic Metabolic  Panel: Recent Labs  Lab 11/22/23 0420  NA 139  K 4.7  CL 104  CO2 29  GLUCOSE 90  BUN 26*  CREATININE 0.75  CALCIUM  8.6*  MG 2.6*   GFR: Estimated Creatinine Clearance: 43.4 mL/min (by C-G formula based on SCr of 0.75 mg/dL).   No results for input(s): AMMONIA in the last 168 hours.   Radiology Studies: No results found.    LOS: 30 days   Time spent= 35 mins    Adriana DELENA Grams, MD [facility]  If 7PM-7AM, please contact night-coverage  11/25/2023, 12:38 PM

## 2023-11-25 NOTE — TOC Progression Note (Signed)
 Transition of Care [facility]) - Progression Note    Patient Details  Name: Heidi Fuentes MRN: [id number] Date of Birth: 02-24-1945  Transition of Care [facility]) CM/SW Contact  Sharlyne Stabs, RN Phone Number: 11/25/2023, 12:12 PM  Clinical Narrative:   CM at the bedside with Laptop. Patient participated in [facility] through [place]. The decision was made for for protective services to be taken and Aniya APS will work with [facility] to place patient. IPCM sent out and we have multiple offers, CM gave to Aniya, She will discuss with her supervisor and update IPCM.    Expected Discharge Plan: Skilled Nursing Facility Barriers to Discharge: Continued Medical Work up  DSS Guardianship request start date: 10/27/23 DSS Service county:  [place], transferred to [place] due to conflict of interest) DSS Date of decision notification: 10/28/23 DSS Guardianship decision: Substantiated      Expected Discharge Plan and Services In-house Referral: Clinical Social Work Discharge Planning Services: NA Post Acute Care Choice: Nursing Home, Skilled Nursing Facility Living arrangements for the past 2 months: Single Family Home        Social Drivers of Health (SDOH) Interventions SDOH Screenings   Food Insecurity: No Food Insecurity (10/25/2023)  Housing: Low Risk  (10/25/2023)  Transportation Needs: No Transportation Needs (10/25/2023)  Utilities: Not At Risk (10/25/2023)  Social Connections: Unknown (10/25/2023)  Tobacco Use: Low Risk  (10/25/2023)    Readmission Risk Interventions    11/03/2023   12:54 PM 10/29/2023   10:31 AM 10/28/2023   10:36 AM  Readmission Risk Prevention Plan  Transportation Screening Complete Complete Complete  PCP or Specialist Appt within 5-7 Days   Complete  Home Care Screening  Complete Complete  Medication Review (RN CM)  Complete Complete  HRI or Home Care Consult Complete    Social Work Consult for Recovery Care Planning/Counseling Complete    Palliative Care  Screening Not Applicable    Medication Review Ocean[employer]) Complete

## 2023-11-26 DIAGNOSIS — F0390 Unspecified dementia without behavioral disturbance: Secondary | ICD-10-CM | POA: Diagnosis not present

## 2023-11-26 MED ORDER — ADULT MULTIVITAMIN W/MINERALS CH: 1.0000 | ORAL_TABLET | Freq: Every day | ORAL | 1 refills | Status: AC

## 2023-11-26 MED ORDER — RISPERIDONE 0.5 MG PO TABS: 0.5000 mg | ORAL_TABLET | Freq: Two times a day (BID) | ORAL | 2 refills | Status: AC

## 2023-11-26 MED ORDER — CARVEDILOL 3.125 MG PO TABS: 3.1250 mg | ORAL_TABLET | Freq: Two times a day (BID) | ORAL | 2 refills | Status: AC

## 2023-11-26 MED ORDER — NYSTATIN 100000 UNIT/GM EX POWD: Freq: Two times a day (BID) | CUTANEOUS | 0 refills | Status: AC

## 2023-11-26 MED ORDER — ACETAMINOPHEN 500 MG PO TABS: 1000.0000 mg | ORAL_TABLET | Freq: Three times a day (TID) | ORAL | 0 refills | Status: AC | PRN

## 2023-11-26 MED ORDER — LOSARTAN POTASSIUM 100 MG PO TABS: 100.0000 mg | ORAL_TABLET | Freq: Every day | ORAL | 2 refills | Status: AC

## 2023-11-26 MED ORDER — METHOCARBAMOL 500 MG PO TABS
500.0000 mg | ORAL_TABLET | Freq: Three times a day (TID) | ORAL | Status: DC | PRN
Start: 2023-11-26 — End: 2023-11-26
  Administered 2023-11-26: 500 mg via ORAL
  Filled 2023-11-26: qty 1

## 2023-11-26 MED ORDER — METHOCARBAMOL 500 MG PO TABS: 500.0000 mg | ORAL_TABLET | Freq: Three times a day (TID) | ORAL | 1 refills | Status: AC | PRN

## 2023-11-26 MED ORDER — LAMOTRIGINE 25 MG PO TABS: 25.0000 mg | ORAL_TABLET | Freq: Two times a day (BID) | ORAL | 1 refills | Status: AC

## 2023-11-26 NOTE — Discharge Summary (Signed)
 Physician Discharge Summary   Patient: Heidi Fuentes MRN: [id number] DOB: [birth year]  Admit date:     10/25/2023  Discharge date: 11/26/23  Discharge Physician: Adriana DELENA Grams   PCP: [name], DO   Recommendations at discharge:   Continue current medication, subject to change by primary care doctor and psychiatrist Continue aggressive PT OT, fall precautions Follow-up with PCP in 1 week Follow-up with a psychiatrist in 1-2 weeks  Discharge Diagnoses: Principal Problem:   Major neurocognitive disorder [facility]) Active Problems:   Unable to make decisions about medical treatment due to impaired mental capacity   Acquired hypothyroidism   Bipolar affective disorder, depressed, mild (HCC)   Malnutrition of moderate degree  Resolved Problems:   Acute metabolic encephalopathy   Chronic narcotic use  Hospital Course: Brief Narrative:    78 y.o. female with past medical history significant for asthma, bipolar disorder, hypertension, chronic pain/fibromyalgia, and bronchiectasis presented to the hospital with altered mental status.  She was also noted to be tachycardic and tachypneic.  Labs were notable for negative CT head scan.  Chest x-ray was negative.  MRI of the brain was negative as well.  UA not suggestive of infection. During early part of the hospitalization, the patient remained primarily agitated.  She refused to take her medications and would frequently spit them out.  Her oral intake was poor.  Her metabolic derangements were optimized.  Workup for reversible causes including serum B12, folic acid , TSH, and ammonia were largely unremarkable.  Most of her medications were discontinued as the patient was not appropriately taking that at home.  Subsequently patient was willing to take her medications.  She did have argumentativeness and social services was consulted to assist with guardianship.  Currently awaiting for safe disposition.   Acute metabolic/toxic  encephalopathy with delirium  Mentation at baseline, awake alert oriented x 4   Cognitive impairment, associate with polypharmacy Multiple psychiatry medications has been discontinued/adjusted.   Currently patient is on Prozac , Lamictal  and Haldol  and lorazepam  as needed.  Awaiting guardianship.     - Patient is stable now, one-to-one observation-sitter discontinued   Increased haldol  dose on 10/11 for PRN severe agitation only.    lorazepam  as needed for severe agitation only.       Bipolar disorder Her mood remains stable tolerating current medications IRIS team saw the patient on 9/29, does not meet inpatient criteria.  awaiting SNF placement per therapy.  Haldol  and lorazepam  only to be used for severe agitation only.    TTS consult requested on 10/11 and seen patient on 10/12, see new recommendations.  Increased dose of Risperdal  per recommendations to 0.5 mg BID on 10/17, and continue Lamictal  25 mg p.o. twice daily-  Per psychiatrist recommendations.  (So far well-tolerated).     Hypothyroidism -Continue Synthroid .  Latest TSH of 1.4   Essential hypertension -changing metoprolol  to Coreg-lower dose due to mild bradycardia increasing Cozaar  due to elevated blood pressure       Abnormal UA ? UTI -Pt has completed treatment with a dose of fosfomycin on 11/15/23.   GERD -Continue Protonix    COPD -stable -no signs of exacerbation, as needed nebs   Chronic low back pain -continue lidocaine  patch -PRN tylenol  also in place -will add PRN robaxin    Anxiety/depression -will Continue Lamictal  and Prozac    Physical deconditioning -Continue physical therapy while in house. -recommendations provided are for skilled nursing facility placement.   Hypokalemia -resolved     Code Status:  Code Status: Full Code           Consultants: TTS/psych/TOC  Disposition: Skilled nursing facility Diet recommendation:  Discharge Diet Orders (From admission, onward)     Start      Ordered   11/26/23 0000  Diet - low sodium heart healthy        11/26/23 1033           Cardiac diet DISCHARGE MEDICATION: Allergies as of 11/26/2023       Reactions   Sulfa Antibiotics Rash, Hives   Dog Epithelium (canis Lupus Familiaris) Other (See Comments)   Mirtazapine Other (See Comments)   HA's   Trazodone Other (See Comments)   Trazodone Hcl Other (See Comments)   strange dreams   Ziprasidone Other (See Comments)   cardiac issues   Aripiprazole Other (See Comments)   Bad dreams   Quetiapine  Palpitations   Trazodone And Nefazodone Other (See Comments)   unknown   Ziprasidone Hcl Other (See Comments)   hospitalization specifics        Medication List     STOP taking these medications    buPROPion  75 MG tablet Commonly known as: WELLBUTRIN    metoprolol  tartrate 25 MG tablet Commonly known as: LOPRESSOR    montelukast  10 MG tablet Commonly known as: SINGULAIR    Oxycodone  HCl 10 MG Tabs   polycarbophil 625 MG tablet Commonly known as: FIBERCON       TAKE these medications    acetaminophen  500 MG tablet Commonly known as: TYLENOL  Take 2 tablets (1,000 mg total) by mouth every 8 (eight) hours as needed for moderate pain (pain score 4-6) or mild pain (pain score 1-3). What changed:  how much to take when to take this additional instructions   carvedilol 3.125 MG tablet Commonly known as: COREG Take 1 tablet (3.125 mg total) by mouth 2 (two) times daily with a meal.   FLUoxetine  40 MG capsule Commonly known as: PROZAC  Take 1 capsule (40 mg total) by mouth daily. Start on 03/13/23   gabapentin  300 MG capsule Commonly known as: NEURONTIN  Take 600 mg by mouth 4 (four) times daily.   lamoTRIgine  25 MG tablet Commonly known as: LAMICTAL  Take 1 tablet (25 mg total) by mouth 2 (two) times daily. What changed:  medication strength how much to take   latanoprost  0.005 % ophthalmic solution Commonly known as: XALATAN  Place 1 drop into both  eyes at bedtime.   levothyroxine  25 MCG tablet Commonly known as: SYNTHROID  Take 25 mcg by mouth daily before breakfast.   Lidocaine -Hydrocortisone  Ace 3-0.5 % Crea Apply pea size amount to anorectum 4 times a day as needed   losartan  100 MG tablet Commonly known as: COZAAR  Take 1 tablet (100 mg total) by mouth daily. Start taking on: November 27, 2023 What changed:  medication strength how much to take   melatonin 5 MG Tabs Take 5 mg by mouth at bedtime.   methocarbamol  500 MG tablet Commonly known as: ROBAXIN  Take 1 tablet (500 mg total) by mouth every 8 (eight) hours as needed for muscle spasms.   multivitamin with minerals Tabs tablet Take 1 tablet by mouth daily. Start taking on: November 27, 2023   nystatin powder Commonly known as: MYCOSTATIN/NYSTOP Apply topically 2 (two) times daily.   pantoprazole  20 MG tablet Commonly known as: PROTONIX  Take 20 mg by mouth daily.   risperiDONE  0.5 MG tablet Commonly known as: RISPERDAL  Take 1 tablet (0.5 mg total) by mouth 2 (two) times daily.  Symbicort 160-4.5 MCG/ACT inhaler Generic drug: budesonide -formoterol  Inhale 2 puffs into the lungs in the morning and at bedtime.   Ventolin  HFA 108 (90 Base) MCG/ACT inhaler Generic drug: albuterol  Inhale 1-2 puffs into the lungs every 6 (six) hours as needed for wheezing or shortness of breath.        Contact information for after-discharge care     Destination     [facility] .   Service: Skilled Nursing Contact information: [address] [place]  [phone]                    Discharge Exam: [name] Weights   11/24/23 0500 11/25/23 0551 11/26/23 0449  Weight: 52.4 kg 53.6 kg 53 kg        General:  AAO x 3,  cooperative, no distress;   HEENT:  Normocephalic, PERRL, otherwise with in Normal limits   Neuro:  CNII-XII intact. , normal motor and sensation, reflexes intact   Lungs:   Clear to auscultation BL,  Respirations unlabored,  No wheezes / crackles  Cardio:    S1/S2, RRR, No murmure, No Rubs or Gallops   Abdomen:  Soft, non-tender, bowel sounds active all four quadrants, no guarding or peritoneal signs.  Muscular  skeletal:  Limited exam -global generalized weaknesses - in bed, able to move all 4 extremities,   2+ pulses,  symmetric, No pitting edema  Skin:  Dry, warm to touch, negative for any Rashes,  Wounds: Please see nursing documentation          Condition at discharge: good  The results of significant diagnostics from this hospitalization (including imaging, microbiology, ancillary and laboratory) are listed below for reference.   Imaging Studies: No results found.  Microbiology: Results for orders placed or performed during the hospital encounter of 03/06/23  Blood Culture (routine x 2)     Status: None   Collection Time: 03/06/23  4:41 PM   Specimen: BLOOD  Result Value Ref Range Status   Specimen Description BLOOD BLOOD RIGHT ARM  Final   Special Requests NONE  Final   Culture   Final    NO GROWTH 5 DAYS Performed at [facility], [address], [place], [place] [address]    Report Status 03/11/2023 FINAL  Final  Blood Culture (routine x 2)     Status: None   Collection Time: 03/06/23  4:41 PM   Specimen: BLOOD  Result Value Ref Range Status   Specimen Description BLOOD  Final   Special Requests NONE  Final   Culture   Final    NO GROWTH 5 DAYS Performed at [facility], [address] [place], [place] [address]    Report Status 03/11/2023 FINAL  Final  Resp panel by RT-PCR (RSV, Flu A&B, Covid) Anterior Nasal Swab     Status: Abnormal   Collection Time: 03/06/23  4:42 PM   Specimen: Anterior Nasal Swab  Result Value Ref Range Status   SARS Coronavirus 2 by RT PCR POSITIVE (A) NEGATIVE Final    Comment: (NOTE) SARS-CoV-2 target nucleic acids are DETECTED.  The SARS-CoV-2 RNA is generally detectable in upper respiratory specimens during the acute  phase of infection. Positive results are indicative of the presence of the identified virus, but do not rule out bacterial infection or co-infection with other pathogens not detected by the test. Clinical correlation with patient history and other diagnostic information is necessary to determine patient infection status. The expected result is Negative.  Fact Sheet for Patients: BloggerCourse.com  Fact Sheet for Healthcare Providers: SeriousBroker.it  This test is not yet approved or cleared by the [place]  FDA and  has been authorized for detection and/or diagnosis of SARS-CoV-2 by FDA under an Emergency Use Authorization (EUA).  This EUA will remain in effect (meaning this test can be used) for the duration of  the COVID-19 declaration under Section 564(b)(1) of the A ct, 21 U.S.C. section 360bbb-3(b)(1), unless the authorization is terminated or revoked sooner.     Influenza A by PCR NEGATIVE NEGATIVE Final   Influenza B by PCR NEGATIVE NEGATIVE Final    Comment: (NOTE) The Xpert Xpress SARS-CoV-2/FLU/RSV plus assay is intended as an aid in the diagnosis of influenza from Nasopharyngeal swab specimens and should not be used as a sole basis for treatment. Nasal washings and aspirates are unacceptable for Xpert Xpress SARS-CoV-2/FLU/RSV testing.  Fact Sheet for Patients: BloggerCourse.com  Fact Sheet for Healthcare Providers: SeriousBroker.it  This test is not yet approved or cleared by the [place]  FDA and has been authorized for detection and/or diagnosis of SARS-CoV-2 by FDA under an Emergency Use Authorization (EUA). This EUA will remain in effect (meaning this test can be used) for the duration of the COVID-19 declaration under Section 564(b)(1) of the Act, 21 U.S.C. section 360bbb-3(b)(1), unless the authorization is terminated or revoked.     Resp Syncytial  Virus by PCR NEGATIVE NEGATIVE Final    Comment: (NOTE) Fact Sheet for Patients: BloggerCourse.com  Fact Sheet for Healthcare Providers: SeriousBroker.it  This test is not yet approved or cleared by the [place]  FDA and has been authorized for detection and/or diagnosis of SARS-CoV-2 by FDA under an Emergency Use Authorization (EUA). This EUA will remain in effect (meaning this test can be used) for the duration of the COVID-19 declaration under Section 564(b)(1) of the Act, 21 U.S.C. section 360bbb-3(b)(1), unless the authorization is terminated or revoked.  Performed at [facility], [address] [place], [place] [address]     Labs: CBC: Recent Labs  Lab 11/22/23 0420  WBC 5.8  HGB 10.1*  HCT 32.0*  MCV 100.0  PLT 188   Basic Metabolic Panel: Recent Labs  Lab 11/22/23 0420  NA 139  K 4.7  CL 104  CO2 29  GLUCOSE 90  BUN 26*  CREATININE 0.75  CALCIUM  8.6*  MG 2.6*   Liver Function Tests: No results for input(s): AST, ALT, ALKPHOS, BILITOT, PROT, ALBUMIN in the last 168 hours. CBG: No results for input(s): GLUCAP in the last 168 hours.  Discharge time spent: greater than 30 minutes.  Signed: Adriana DELENA Grams, MD [facility] 11/26/2023

## 2023-11-26 NOTE — Progress Notes (Signed)
   11/26/23 0449  Pain Assessment  Pain Scale 0-10  Pain Score 10  Pain Type Acute pain  Pain Location Finger (Comment which one)  Pain Orientation Right  Pain Onset Sudden  Pain Intervention(s) MD notified (Comment);Refused;Repositioned;Elevated extremity;Emotional support;Rest;Relaxation;Prayer;Massage  Glasgow Coma Scale  Eye Opening 4  Best Verbal Response (NON-intubated) 5  Best Motor Response 6  Glasgow Coma Scale Score 15  Provider Notification  Provider Name/Title Dr. WENDI Cone  Date Provider Notified 11/26/23  Time Provider Notified [phone]  Method of Notification Page  Notification Reason Red med refusal;Requested by patient/family (This Pt requests PRN Robaxin  for her muscle spasm, back pain, neck pain and new contracture of her right hand, right fingers and shoulder. She refused to take narcotics.)  Provider response See new order or Robaxin  PRN. Evaluate remotely  Date of Provider Response 11/26/23  Time of Provider Response 0451   Wendi Dash, RN

## 2023-11-26 NOTE — Plan of Care (Signed)
 Reviewing plan of care. Pt has been progressing.  Problem: Clinical Measurements: Goal: Ability to maintain clinical measurements within normal limits will improve Outcome: Progressing Goal: Diagnostic test results will improve Outcome: Progressing Goal: Respiratory complications will improve Outcome: Progressing Goal: Cardiovascular complication will be avoided Outcome: Progressing   Problem: Activity: Goal: Risk for activity intolerance will decrease Outcome: Progressing   Problem: Nutrition: Goal: Adequate nutrition will be maintained Outcome: Progressing   Problem: Coping: Goal: Level of anxiety will decrease Outcome: Progressing   Problem: Pain Managment: Goal: General experience of comfort will improve and/or be controlled Outcome: Progressing   Problem: Safety: Goal: Ability to remain free from injury will improve Outcome: Progressing   Problem: Skin Integrity: Goal: Risk for impaired skin integrity will decrease Outcome: Progressing   Wendi Dash, RN

## 2023-11-26 NOTE — Progress Notes (Signed)
 IV discontinued, site WNL. Report called to [facility] [place] and given to Jasmine Jacobs, LPN. Patient will be transported by [employer] transportation.

## 2023-11-26 NOTE — TOC Transition Note (Addendum)
 Transition of Care [facility]) - Discharge Note   Patient Details  Name: Heidi Fuentes MRN: [id number] Date of Birth: May 18, 1945  Transition of Care [facility]) CM/SW Contact:  Sharlyne Stabs, RN Phone Number: 11/26/2023, 11:43 AM   Clinical Narrative:   Aniya confirmed the bed choice at [facility]. Tiffany the liaison provided a room number and number for report. DC Summary sent, RN calling report. EMS scheduled for 1:30, [place] will have her room ready at 2Pm. Son, Manus mari and plans to take cloths to [facility].   Addendum:  [facility] will pick up at 1:30.   Final next level of care: Skilled Nursing Facility Barriers to Discharge: Barriers Resolved   Patient Goals and CMS Choice Patient states their goals for this hospitalization and ongoing recovery are:: Return home, will go to SNF at protective order directs CMS Medicare.gov Compare Post Acute Care list provided to:: Patient Choice offered to / list presented to : Patient [place] ownership interest in [facility].provided to:: Patient    Discharge Placement              Patient chooses bed at:  [facility]) Patient to be transferred to facility by: EMS Name of family member notified: Manus - son    and Aniya with [facility] Patient and family notified of of transfer: 11/26/23  Discharge Plan and Services Additional resources added to the After Visit Summary for   In-house Referral: Clinical Social Work Discharge Planning Services: NA Post Acute Care Choice: Nursing Home, Skilled Nursing Facility                Social Drivers of Health (SDOH) Interventions SDOH Screenings   Food Insecurity: No Food Insecurity (10/25/2023)  Housing: Low Risk  (10/25/2023)  Transportation Needs: No Transportation Needs (10/25/2023)  Utilities: Not At Risk (10/25/2023)  Social Connections: Unknown (10/25/2023)  Tobacco Use: Low Risk  (10/25/2023)     Readmission Risk Interventions     11/03/2023   12:54 PM 10/29/2023   10:31 AM 10/28/2023   10:36 AM  Readmission Risk Prevention Plan  Transportation Screening Complete Complete Complete  PCP or Specialist Appt within 5-7 Days   Complete  Home Care Screening  Complete Complete  Medication Review (RN CM)  Complete Complete  HRI or Home Care Consult Complete    Social Work Consult for Recovery Care Planning/Counseling Complete    Palliative Care Screening Not Applicable    Medication Review Oceanographer) Complete

## 2023-11-26 NOTE — Progress Notes (Signed)
 Mobility Specialist Progress Note:    11/26/23 0900  Mobility  Activity Ambulated with assistance  Level of Assistance Modified independent, requires aide device or extra time  Assistive Device None  Distance Ambulated (ft) 20 ft  Range of Motion/Exercises Active;All extremities  Activity Response Tolerated well  Mobility Referral Yes  Mobility visit 1 Mobility  Mobility Specialist Start Time (ACUTE ONLY) 0900  Mobility Specialist Stop Time (ACUTE ONLY) 0925  Mobility Specialist Time Calculation (min) (ACUTE ONLY) 25 min   Pt received standing at doorway, requesting assistance with breakfast tray. ModI to ambulate with no AD. Tolerated well, returned supine with breakfast tray set up. All needs met.  Glenford Garis Mobility Specialist Please contact via Special educational needs teacher or  Rehab office at [phone]

## 2023-11-26 NOTE — Care Management Important Message (Signed)
 Important Message  Patient Details  Name: Heidi Fuentes MRN: [id number] Date of Birth: Jan 15, 1946   Important Message Given:  Yes - Medicare IM (copy left on bedside table)     Duwaine LITTIE Ada 11/26/2023, 11:15 AM
